# Patient Record
Sex: Male | Born: 1945
Health system: Southern US, Community
[De-identification: ages and names within clinical notes are randomized; demographics above are authoritative.]

## PROBLEM LIST (undated history)

## (undated) DIAGNOSIS — Z860100 Personal history of colon polyps, unspecified: Secondary | ICD-10-CM

## (undated) DIAGNOSIS — G4730 Sleep apnea, unspecified: Secondary | ICD-10-CM

## (undated) DIAGNOSIS — I609 Nontraumatic subarachnoid hemorrhage, unspecified: Secondary | ICD-10-CM

## (undated) DIAGNOSIS — N200 Calculus of kidney: Secondary | ICD-10-CM

## (undated) DIAGNOSIS — I2510 Atherosclerotic heart disease of native coronary artery without angina pectoris: Secondary | ICD-10-CM

## (undated) DIAGNOSIS — S065XAA Traumatic subdural hemorrhage with loss of consciousness status unknown, initial encounter: Secondary | ICD-10-CM

## (undated) DIAGNOSIS — Z8719 Personal history of other diseases of the digestive system: Secondary | ICD-10-CM

## (undated) DIAGNOSIS — R569 Unspecified convulsions: Secondary | ICD-10-CM

## (undated) DIAGNOSIS — E039 Hypothyroidism, unspecified: Secondary | ICD-10-CM

## (undated) DIAGNOSIS — R55 Syncope and collapse: Secondary | ICD-10-CM

## (undated) DIAGNOSIS — M1990 Unspecified osteoarthritis, unspecified site: Secondary | ICD-10-CM

## (undated) DIAGNOSIS — I739 Peripheral vascular disease, unspecified: Secondary | ICD-10-CM

## (undated) DIAGNOSIS — N529 Male erectile dysfunction, unspecified: Secondary | ICD-10-CM

## (undated) DIAGNOSIS — S065X9A Traumatic subdural hemorrhage with loss of consciousness of unspecified duration, initial encounter: Secondary | ICD-10-CM

## (undated) DIAGNOSIS — I10 Essential (primary) hypertension: Secondary | ICD-10-CM

## (undated) DIAGNOSIS — E119 Type 2 diabetes mellitus without complications: Secondary | ICD-10-CM

## (undated) DIAGNOSIS — T7840XA Allergy, unspecified, initial encounter: Secondary | ICD-10-CM

## (undated) HISTORY — DX: Allergy, unspecified, initial encounter: T78.40XA

## (undated) HISTORY — DX: Unspecified osteoarthritis, unspecified site: M19.90

## (undated) HISTORY — DX: Peripheral vascular disease, unspecified: I73.9

## (undated) HISTORY — DX: Sleep apnea, unspecified: G47.30

## (undated) HISTORY — DX: Atherosclerotic heart disease of native coronary artery without angina pectoris: I25.10

## (undated) HISTORY — DX: Traumatic subdural hemorrhage with loss of consciousness of unspecified duration, initial encounter: S06.5X9A

## (undated) HISTORY — DX: Essential (primary) hypertension: I10

## (undated) HISTORY — PX: POLYPECTOMY: SHX149

## (undated) HISTORY — DX: Nontraumatic subarachnoid hemorrhage, unspecified: I60.9

## (undated) HISTORY — DX: Syncope and collapse: R55

## (undated) HISTORY — DX: Traumatic subdural hemorrhage with loss of consciousness status unknown, initial encounter: S06.5XAA

---

## 1999-01-02 ENCOUNTER — Encounter: Payer: Self-pay | Admitting: Chiropractic Medicine

## 1999-01-02 ENCOUNTER — Ambulatory Visit (HOSPITAL_COMMUNITY): Admission: RE | Admit: 1999-01-02 | Discharge: 1999-01-02 | Payer: Self-pay | Admitting: Chiropractic Medicine

## 2001-04-30 ENCOUNTER — Encounter: Payer: Self-pay | Admitting: Emergency Medicine

## 2001-04-30 ENCOUNTER — Emergency Department (HOSPITAL_COMMUNITY): Admission: EM | Admit: 2001-04-30 | Discharge: 2001-04-30 | Payer: Self-pay | Admitting: Emergency Medicine

## 2003-12-13 ENCOUNTER — Encounter: Admission: RE | Admit: 2003-12-13 | Discharge: 2004-03-12 | Payer: Self-pay | Admitting: Internal Medicine

## 2004-05-21 ENCOUNTER — Ambulatory Visit (HOSPITAL_BASED_OUTPATIENT_CLINIC_OR_DEPARTMENT_OTHER): Admission: RE | Admit: 2004-05-21 | Discharge: 2004-05-21 | Payer: Self-pay | Admitting: Internal Medicine

## 2004-08-20 ENCOUNTER — Ambulatory Visit: Payer: Self-pay | Admitting: Endocrinology

## 2005-06-07 ENCOUNTER — Ambulatory Visit: Payer: Self-pay | Admitting: Endocrinology

## 2005-06-21 ENCOUNTER — Encounter: Admission: RE | Admit: 2005-06-21 | Discharge: 2005-09-19 | Payer: Self-pay | Admitting: Endocrinology

## 2005-11-04 ENCOUNTER — Ambulatory Visit: Payer: Self-pay | Admitting: Endocrinology

## 2005-11-22 ENCOUNTER — Ambulatory Visit: Payer: Self-pay | Admitting: Internal Medicine

## 2005-11-29 ENCOUNTER — Ambulatory Visit: Payer: Self-pay | Admitting: Internal Medicine

## 2006-06-13 ENCOUNTER — Ambulatory Visit: Payer: Self-pay | Admitting: Internal Medicine

## 2007-04-29 ENCOUNTER — Ambulatory Visit: Payer: Self-pay | Admitting: Internal Medicine

## 2007-04-29 LAB — CONVERTED CEMR LAB
ALT: 33 units/L (ref 0–53)
AST: 26 units/L (ref 0–37)
Albumin: 3.7 g/dL (ref 3.5–5.2)
Alkaline Phosphatase: 104 units/L (ref 39–117)
BUN: 18 mg/dL (ref 6–23)
Basophils Absolute: 0 10*3/uL (ref 0.0–0.1)
Basophils Relative: 0.4 % (ref 0.0–1.0)
Bilirubin Urine: NEGATIVE
Bilirubin, Direct: 0.1 mg/dL (ref 0.0–0.3)
CO2: 29 meq/L (ref 19–32)
Calcium: 9.3 mg/dL (ref 8.4–10.5)
Chloride: 107 meq/L (ref 96–112)
Cholesterol: 127 mg/dL (ref 0–200)
Creatinine, Ser: 0.9 mg/dL (ref 0.4–1.5)
Eosinophils Absolute: 0.2 10*3/uL (ref 0.0–0.6)
Eosinophils Relative: 3.5 % (ref 0.0–5.0)
GFR calc Af Amer: 110 mL/min
GFR calc non Af Amer: 91 mL/min
Glucose, Bld: 170 mg/dL — ABNORMAL HIGH (ref 70–99)
HCT: 45.7 % (ref 39.0–52.0)
HDL: 24.8 mg/dL — ABNORMAL LOW (ref >39.0)
Hemoglobin, Urine: NEGATIVE
Hemoglobin: 15.7 g/dL (ref 13.0–17.0)
Hgb A1c MFr Bld: 7.8 % — ABNORMAL HIGH (ref 4.6–6.0)
Ketones, ur: NEGATIVE mg/dL
LDL Cholesterol: 66 mg/dL (ref 0–99)
Leukocytes, UA: NEGATIVE
Lymphocytes Relative: 28.6 % (ref 12.0–46.0)
MCHC: 34.4 g/dL (ref 30.0–36.0)
MCV: 86.1 fL (ref 78.0–100.0)
Monocytes Absolute: 0.8 10*3/uL — ABNORMAL HIGH (ref 0.2–0.7)
Monocytes Relative: 11.2 % — ABNORMAL HIGH (ref 3.0–11.0)
Neutro Abs: 4 10*3/uL (ref 1.4–7.7)
Neutrophils Relative %: 56.3 % (ref 43.0–77.0)
Nitrite: NEGATIVE
PSA: 2.38 ng/mL (ref 0.10–4.00)
Platelets: 199 10*3/uL (ref 150–400)
Potassium: 4.5 meq/L (ref 3.5–5.1)
RBC: 5.31 M/uL (ref 4.22–5.81)
RDW: 13.5 % (ref 11.5–14.6)
Sodium: 141 meq/L (ref 135–145)
Specific Gravity, Urine: >=1.03 (ref 1.000–1.03)
TSH: 8.73 microintl units/mL — ABNORMAL HIGH (ref 0.35–5.50)
Total Bilirubin: 0.7 mg/dL (ref 0.3–1.2)
Total CHOL/HDL Ratio: 5.1
Total Protein, Urine: NEGATIVE mg/dL
Total Protein: 7.4 g/dL (ref 6.0–8.3)
Triglycerides: 179 mg/dL — ABNORMAL HIGH (ref 0–149)
Urine Glucose: NEGATIVE mg/dL
Urobilinogen, UA: 1 (ref 0.0–1.0)
VLDL: 36 mg/dL (ref 0–40)
WBC: 7 10*3/uL (ref 4.5–10.5)
pH: 6 (ref 5.0–8.0)

## 2007-05-07 ENCOUNTER — Ambulatory Visit: Payer: Self-pay | Admitting: Internal Medicine

## 2007-05-07 DIAGNOSIS — Z8719 Personal history of other diseases of the digestive system: Secondary | ICD-10-CM | POA: Insufficient documentation

## 2007-05-07 DIAGNOSIS — G4733 Obstructive sleep apnea (adult) (pediatric): Secondary | ICD-10-CM | POA: Insufficient documentation

## 2007-05-07 DIAGNOSIS — E039 Hypothyroidism, unspecified: Secondary | ICD-10-CM | POA: Insufficient documentation

## 2007-05-07 DIAGNOSIS — J309 Allergic rhinitis, unspecified: Secondary | ICD-10-CM | POA: Insufficient documentation

## 2007-05-24 DIAGNOSIS — E119 Type 2 diabetes mellitus without complications: Secondary | ICD-10-CM

## 2007-06-22 ENCOUNTER — Ambulatory Visit: Payer: Self-pay | Admitting: Internal Medicine

## 2007-06-30 ENCOUNTER — Encounter: Payer: Self-pay | Admitting: Internal Medicine

## 2007-07-07 ENCOUNTER — Ambulatory Visit: Payer: Self-pay | Admitting: Internal Medicine

## 2007-07-07 ENCOUNTER — Encounter: Payer: Self-pay | Admitting: Internal Medicine

## 2007-07-07 LAB — HM COLONOSCOPY

## 2007-11-03 ENCOUNTER — Ambulatory Visit: Payer: Self-pay | Admitting: Internal Medicine

## 2007-11-03 DIAGNOSIS — J019 Acute sinusitis, unspecified: Secondary | ICD-10-CM | POA: Insufficient documentation

## 2007-11-03 DIAGNOSIS — R059 Cough, unspecified: Secondary | ICD-10-CM | POA: Insufficient documentation

## 2007-11-03 DIAGNOSIS — R05 Cough: Secondary | ICD-10-CM

## 2007-11-05 ENCOUNTER — Ambulatory Visit: Payer: Self-pay | Admitting: Internal Medicine

## 2007-12-07 ENCOUNTER — Ambulatory Visit: Payer: Self-pay | Admitting: Internal Medicine

## 2007-12-07 DIAGNOSIS — Z87442 Personal history of urinary calculi: Secondary | ICD-10-CM

## 2007-12-07 DIAGNOSIS — Z860100 Personal history of colon polyps, unspecified: Secondary | ICD-10-CM | POA: Insufficient documentation

## 2007-12-07 DIAGNOSIS — K5730 Diverticulosis of large intestine without perforation or abscess without bleeding: Secondary | ICD-10-CM | POA: Insufficient documentation

## 2007-12-08 LAB — CONVERTED CEMR LAB
ALT: 45 units/L (ref 0–53)
AST: 28 units/L (ref 0–37)
Albumin: 4 g/dL (ref 3.5–5.2)
Alkaline Phosphatase: 103 units/L (ref 39–117)
BUN: 13 mg/dL (ref 6–23)
Bilirubin, Direct: 0.1 mg/dL (ref 0.0–0.3)
CO2: 30 meq/L (ref 19–32)
Calcium: 9.4 mg/dL (ref 8.4–10.5)
Chloride: 103 meq/L (ref 96–112)
Cholesterol: 132 mg/dL (ref 0–200)
Creatinine, Ser: 1 mg/dL (ref 0.4–1.5)
Direct LDL: 72.2 mg/dL
GFR calc Af Amer: 98 mL/min
GFR calc non Af Amer: 81 mL/min
Glucose, Bld: 205 mg/dL — ABNORMAL HIGH (ref 70–99)
HDL: 29 mg/dL — ABNORMAL LOW (ref >39.0)
Hgb A1c MFr Bld: 8.2 % — ABNORMAL HIGH (ref 4.6–6.0)
Potassium: 4.8 meq/L (ref 3.5–5.1)
Sodium: 140 meq/L (ref 135–145)
TSH: 2.42 microintl units/mL (ref 0.35–5.50)
Total Bilirubin: 0.7 mg/dL (ref 0.3–1.2)
Total CHOL/HDL Ratio: 4.6
Total Protein: 7.4 g/dL (ref 6.0–8.3)
Triglycerides: 224 mg/dL (ref 0–149)
VLDL: 45 mg/dL — ABNORMAL HIGH (ref 0–40)

## 2007-12-15 ENCOUNTER — Telehealth: Payer: Self-pay | Admitting: Internal Medicine

## 2008-01-26 ENCOUNTER — Ambulatory Visit: Payer: Self-pay | Admitting: Internal Medicine

## 2008-01-26 LAB — CONVERTED CEMR LAB
BUN: 14 mg/dL (ref 6–23)
Basophils Absolute: 0 10*3/uL (ref 0.0–0.1)
Basophils Relative: 0.5 % (ref 0.0–1.0)
CO2: 28 meq/L (ref 19–32)
Calcium: 9.2 mg/dL (ref 8.4–10.5)
Chloride: 97 meq/L (ref 96–112)
Creatinine, Ser: 1.1 mg/dL (ref 0.4–1.5)
Eosinophils Absolute: 0 10*3/uL (ref 0.0–0.7)
Eosinophils Relative: 0.1 % (ref 0.0–5.0)
GFR calc Af Amer: 88 mL/min
GFR calc non Af Amer: 72 mL/min
Glucose, Bld: 229 mg/dL — ABNORMAL HIGH (ref 70–99)
HCT: 48.3 % (ref 39.0–52.0)
Hemoglobin: 16 g/dL (ref 13.0–17.0)
Lymphocytes Relative: 14.6 % (ref 12.0–46.0)
MCHC: 33.1 g/dL (ref 30.0–36.0)
MCV: 86.3 fL (ref 78.0–100.0)
Monocytes Absolute: 0.7 10*3/uL (ref 0.1–1.0)
Monocytes Relative: 8.7 % (ref 3.0–12.0)
Neutro Abs: 5.7 10*3/uL (ref 1.4–7.7)
Neutrophils Relative %: 76.1 % (ref 43.0–77.0)
Platelets: 135 10*3/uL — ABNORMAL LOW (ref 150–400)
Potassium: 4.3 meq/L (ref 3.5–5.1)
RBC: 5.6 M/uL (ref 4.22–5.81)
RDW: 13.6 % (ref 11.5–14.6)
Sodium: 136 meq/L (ref 135–145)
WBC: 7.5 10*3/uL (ref 4.5–10.5)

## 2008-01-27 ENCOUNTER — Encounter: Payer: Self-pay | Admitting: Internal Medicine

## 2008-02-03 ENCOUNTER — Telehealth (INDEPENDENT_AMBULATORY_CARE_PROVIDER_SITE_OTHER): Payer: Self-pay | Admitting: *Deleted

## 2008-05-12 ENCOUNTER — Ambulatory Visit: Payer: Self-pay | Admitting: Internal Medicine

## 2008-05-12 LAB — CONVERTED CEMR LAB
ALT: 74 units/L — ABNORMAL HIGH (ref 0–53)
AST: 60 units/L — ABNORMAL HIGH (ref 0–37)
Albumin: 4 g/dL (ref 3.5–5.2)
Alkaline Phosphatase: 87 units/L (ref 39–117)
BUN: 13 mg/dL (ref 6–23)
Bacteria, UA: NEGATIVE
Basophils Absolute: 0 10*3/uL (ref 0.0–0.1)
Basophils Relative: 0.6 % (ref 0.0–3.0)
Bilirubin Urine: NEGATIVE
Bilirubin, Direct: 0.1 mg/dL (ref 0.0–0.3)
CO2: 28 meq/L (ref 19–32)
Calcium: 8.8 mg/dL (ref 8.4–10.5)
Chloride: 104 meq/L (ref 96–112)
Cholesterol: 122 mg/dL (ref 0–200)
Creatinine, Ser: 0.9 mg/dL (ref 0.4–1.5)
Crystals: NEGATIVE
Eosinophils Absolute: 0.3 10*3/uL (ref 0.0–0.7)
Eosinophils Relative: 4.5 % (ref 0.0–5.0)
GFR calc Af Amer: 110 mL/min
GFR calc non Af Amer: 91 mL/min
Glucose, Bld: 194 mg/dL — ABNORMAL HIGH (ref 70–99)
HCT: 45.8 % (ref 39.0–52.0)
HDL: 27.1 mg/dL — ABNORMAL LOW (ref >39.0)
Hemoglobin, Urine: NEGATIVE
Hemoglobin: 15.8 g/dL (ref 13.0–17.0)
Hgb A1c MFr Bld: 8.1 % — ABNORMAL HIGH (ref 4.6–6.0)
Ketones, ur: NEGATIVE mg/dL
LDL Cholesterol: 58 mg/dL (ref 0–99)
Leukocytes, UA: NEGATIVE
Lymphocytes Relative: 28.8 % (ref 12.0–46.0)
MCHC: 34.4 g/dL (ref 30.0–36.0)
MCV: 86 fL (ref 78.0–100.0)
Monocytes Absolute: 0.6 10*3/uL (ref 0.1–1.0)
Monocytes Relative: 8.7 % (ref 3.0–12.0)
Neutro Abs: 4.1 10*3/uL (ref 1.4–7.7)
Neutrophils Relative %: 57.4 % (ref 43.0–77.0)
Nitrite: NEGATIVE
PSA: 1.29 ng/mL (ref 0.10–4.00)
Platelets: 164 10*3/uL (ref 150–400)
Potassium: 4.4 meq/L (ref 3.5–5.1)
RBC: 5.32 M/uL (ref 4.22–5.81)
RDW: 14.4 % (ref 11.5–14.6)
Sodium: 139 meq/L (ref 135–145)
Specific Gravity, Urine: >=1.03 (ref 1.000–1.03)
Squamous Epithelial / HPF: NEGATIVE /lpf
TSH: 2.21 microintl units/mL (ref 0.35–5.50)
Total Bilirubin: 0.9 mg/dL (ref 0.3–1.2)
Total CHOL/HDL Ratio: 4.5
Total Protein: 7.3 g/dL (ref 6.0–8.3)
Triglycerides: 185 mg/dL — ABNORMAL HIGH (ref 0–149)
Urine Glucose: NEGATIVE mg/dL
Urobilinogen, UA: 0.2 (ref 0.0–1.0)
VLDL: 37 mg/dL (ref 0–40)
WBC: 7 10*3/uL (ref 4.5–10.5)
pH: 5.5 (ref 5.0–8.0)

## 2008-05-16 ENCOUNTER — Ambulatory Visit: Payer: Self-pay | Admitting: Internal Medicine

## 2008-05-16 DIAGNOSIS — I119 Hypertensive heart disease without heart failure: Secondary | ICD-10-CM

## 2008-05-16 DIAGNOSIS — R0989 Other specified symptoms and signs involving the circulatory and respiratory systems: Secondary | ICD-10-CM | POA: Insufficient documentation

## 2008-05-16 DIAGNOSIS — R0609 Other forms of dyspnea: Secondary | ICD-10-CM | POA: Insufficient documentation

## 2008-05-17 ENCOUNTER — Encounter: Payer: Self-pay | Admitting: Internal Medicine

## 2008-06-27 ENCOUNTER — Encounter: Payer: Self-pay | Admitting: Internal Medicine

## 2008-06-28 ENCOUNTER — Telehealth: Payer: Self-pay | Admitting: Internal Medicine

## 2008-09-23 ENCOUNTER — Telehealth: Payer: Self-pay | Admitting: Internal Medicine

## 2008-09-27 ENCOUNTER — Telehealth: Payer: Self-pay | Admitting: Internal Medicine

## 2008-11-04 ENCOUNTER — Ambulatory Visit: Payer: Self-pay | Admitting: Internal Medicine

## 2008-11-04 LAB — CONVERTED CEMR LAB
BUN: 18 mg/dL (ref 6–23)
CO2: 29 meq/L (ref 19–32)
Calcium: 9.2 mg/dL (ref 8.4–10.5)
Chloride: 102 meq/L (ref 96–112)
Cholesterol: 143 mg/dL (ref 0–200)
Creatinine, Ser: 1 mg/dL (ref 0.4–1.5)
GFR calc Af Amer: 97 mL/min
GFR calc non Af Amer: 80 mL/min
Glucose, Bld: 227 mg/dL — ABNORMAL HIGH (ref 70–99)
HDL: 29.7 mg/dL — ABNORMAL LOW (ref >39.0)
Hgb A1c MFr Bld: 7.5 % — ABNORMAL HIGH (ref 4.6–6.0)
LDL Cholesterol: 81 mg/dL (ref 0–99)
Potassium: 4.6 meq/L (ref 3.5–5.1)
Sodium: 137 meq/L (ref 135–145)
Total CHOL/HDL Ratio: 4.8
Triglycerides: 163 mg/dL — ABNORMAL HIGH (ref 0–149)
VLDL: 33 mg/dL (ref 0–40)

## 2008-11-21 ENCOUNTER — Ambulatory Visit: Payer: Self-pay | Admitting: Internal Medicine

## 2008-12-12 ENCOUNTER — Telehealth: Payer: Self-pay | Admitting: Internal Medicine

## 2008-12-20 ENCOUNTER — Telehealth: Payer: Self-pay | Admitting: Internal Medicine

## 2009-03-09 ENCOUNTER — Ambulatory Visit: Payer: Self-pay | Admitting: Internal Medicine

## 2009-03-20 ENCOUNTER — Ambulatory Visit: Payer: Self-pay | Admitting: Internal Medicine

## 2009-03-20 DIAGNOSIS — J9801 Acute bronchospasm: Secondary | ICD-10-CM | POA: Insufficient documentation

## 2009-03-28 ENCOUNTER — Telehealth: Payer: Self-pay | Admitting: Internal Medicine

## 2009-03-29 ENCOUNTER — Ambulatory Visit: Payer: Self-pay | Admitting: Internal Medicine

## 2009-05-19 ENCOUNTER — Telehealth: Payer: Self-pay | Admitting: Internal Medicine

## 2009-06-08 ENCOUNTER — Ambulatory Visit: Payer: Self-pay | Admitting: Internal Medicine

## 2009-06-09 LAB — CONVERTED CEMR LAB
ALT: 25 units/L (ref 0–53)
AST: 21 units/L (ref 0–37)
Alkaline Phosphatase: 81 units/L (ref 39–117)
Basophils Absolute: 0 10*3/uL (ref 0.0–0.1)
Bilirubin, Direct: 0.1 mg/dL (ref 0.0–0.3)
CO2: 29 meq/L (ref 19–32)
Calcium: 9 mg/dL (ref 8.4–10.5)
Chloride: 107 meq/L (ref 96–112)
Creatinine,U: 171.4 mg/dL
Eosinophils Relative: 4.1 % (ref 0.0–5.0)
Glucose, Bld: 139 mg/dL — ABNORMAL HIGH (ref 70–99)
HCT: 45.2 % (ref 39.0–52.0)
HDL: 31.7 mg/dL — ABNORMAL LOW (ref >39.00)
Ketones, ur: NEGATIVE mg/dL
LDL Cholesterol: 55 mg/dL (ref 0–99)
Lymphocytes Relative: 27.3 % (ref 12.0–46.0)
Microalb Creat Ratio: 5.3 mg/g (ref 0.0–30.0)
Microalb, Ur: 0.9 mg/dL (ref 0.0–1.9)
Monocytes Relative: 10.3 % (ref 3.0–12.0)
Neutrophils Relative %: 58.2 % (ref 43.0–77.0)
Platelets: 177 10*3/uL (ref 150.0–400.0)
RDW: 14.2 % (ref 11.5–14.6)
Sodium: 141 meq/L (ref 135–145)
Specific Gravity, Urine: >=1.03 (ref 1.000–1.030)
Total Bilirubin: 0.7 mg/dL (ref 0.3–1.2)
Total CHOL/HDL Ratio: 3
Urine Glucose: NEGATIVE mg/dL
VLDL: 21.2 mg/dL (ref 0.0–40.0)
WBC: 6.7 10*3/uL (ref 4.5–10.5)
pH: 6 (ref 5.0–8.0)

## 2009-06-12 ENCOUNTER — Ambulatory Visit: Payer: Self-pay | Admitting: Internal Medicine

## 2009-06-12 DIAGNOSIS — M722 Plantar fascial fibromatosis: Secondary | ICD-10-CM | POA: Insufficient documentation

## 2009-06-12 DIAGNOSIS — M79609 Pain in unspecified limb: Secondary | ICD-10-CM | POA: Insufficient documentation

## 2009-07-14 ENCOUNTER — Telehealth: Payer: Self-pay | Admitting: Internal Medicine

## 2009-07-14 DIAGNOSIS — M25519 Pain in unspecified shoulder: Secondary | ICD-10-CM | POA: Insufficient documentation

## 2009-07-27 ENCOUNTER — Telehealth: Payer: Self-pay | Admitting: Internal Medicine

## 2009-07-28 ENCOUNTER — Telehealth: Payer: Self-pay | Admitting: Internal Medicine

## 2009-09-09 ENCOUNTER — Encounter: Payer: Self-pay | Admitting: Internal Medicine

## 2009-12-07 ENCOUNTER — Ambulatory Visit: Payer: Self-pay | Admitting: Internal Medicine

## 2009-12-07 LAB — CONVERTED CEMR LAB
BUN: 15 mg/dL (ref 6–23)
Calcium: 9.2 mg/dL (ref 8.4–10.5)
Cholesterol: 113 mg/dL (ref 0–200)
GFR calc non Af Amer: 90.41 mL/min (ref >60)
Glucose, Bld: 131 mg/dL — ABNORMAL HIGH (ref 70–99)
HDL: 37.6 mg/dL — ABNORMAL LOW (ref >39.00)
Hgb A1c MFr Bld: 7 % — ABNORMAL HIGH (ref 4.6–6.5)
Potassium: 4.7 meq/L (ref 3.5–5.1)
Sodium: 140 meq/L (ref 135–145)
VLDL: 29 mg/dL (ref 0.0–40.0)

## 2009-12-11 ENCOUNTER — Ambulatory Visit: Payer: Self-pay | Admitting: Internal Medicine

## 2009-12-14 ENCOUNTER — Telehealth: Payer: Self-pay | Admitting: Internal Medicine

## 2009-12-27 ENCOUNTER — Telehealth: Payer: Self-pay | Admitting: Internal Medicine

## 2010-06-07 ENCOUNTER — Ambulatory Visit: Payer: Self-pay | Admitting: Internal Medicine

## 2010-06-07 LAB — CONVERTED CEMR LAB
ALT: 26 units/L (ref 0–53)
AST: 24 units/L (ref 0–37)
Alkaline Phosphatase: 80 units/L (ref 39–117)
Basophils Absolute: 0 10*3/uL (ref 0.0–0.1)
Bilirubin, Direct: 0.1 mg/dL (ref 0.0–0.3)
CO2: 27 meq/L (ref 19–32)
Calcium: 9.1 mg/dL (ref 8.4–10.5)
Chloride: 105 meq/L (ref 96–112)
Creatinine,U: 274.8 mg/dL
Eosinophils Relative: 3.2 % (ref 0.0–5.0)
Glucose, Bld: 142 mg/dL — ABNORMAL HIGH (ref 70–99)
HDL: 31.5 mg/dL — ABNORMAL LOW (ref >39.00)
Hgb A1c MFr Bld: 7.1 % — ABNORMAL HIGH (ref 4.6–6.5)
LDL Cholesterol: 58 mg/dL (ref 0–99)
Lymphocytes Relative: 22.7 % (ref 12.0–46.0)
Lymphs Abs: 1.7 10*3/uL (ref 0.7–4.0)
Microalb, Ur: 2.6 mg/dL — ABNORMAL HIGH (ref 0.0–1.9)
Monocytes Relative: 9.1 % (ref 3.0–12.0)
Neutrophils Relative %: 64.6 % (ref 43.0–77.0)
Platelets: 161 10*3/uL (ref 150.0–400.0)
RDW: 15.1 % — ABNORMAL HIGH (ref 11.5–14.6)
Sodium: 141 meq/L (ref 135–145)
Specific Gravity, Urine: >=1.03 (ref 1.000–1.030)
TSH: 1.11 microintl units/mL (ref 0.35–5.50)
Total Bilirubin: 0.8 mg/dL (ref 0.3–1.2)
Total CHOL/HDL Ratio: 4
Triglycerides: 132 mg/dL (ref 0.0–149.0)
Urine Glucose: NEGATIVE mg/dL
Urobilinogen, UA: 0.2 (ref 0.0–1.0)
VLDL: 26.4 mg/dL (ref 0.0–40.0)
WBC: 7.3 10*3/uL (ref 4.5–10.5)
pH: 5.5 (ref 5.0–8.0)

## 2010-06-11 ENCOUNTER — Encounter: Payer: Self-pay | Admitting: Internal Medicine

## 2010-06-11 ENCOUNTER — Ambulatory Visit: Payer: Self-pay | Admitting: Internal Medicine

## 2010-10-10 ENCOUNTER — Ambulatory Visit
Admission: RE | Admit: 2010-10-10 | Discharge: 2010-10-10 | Payer: Self-pay | Source: Home / Self Care | Attending: Internal Medicine | Admitting: Internal Medicine

## 2010-10-10 ENCOUNTER — Telehealth: Payer: Self-pay | Admitting: Internal Medicine

## 2010-10-11 ENCOUNTER — Telehealth: Payer: Self-pay | Admitting: Internal Medicine

## 2010-11-15 NOTE — Progress Notes (Signed)
  Phone Note Refill Request  on December 14, 2009 9:47 AM  Refills Requested: Medication #1:  GLIMEPIRIDE 4 MG TABS 1po once daily   Dosage confirmed as above?Dosage Confirmed Initial call taken by: Scharlene Gloss,  December 14, 2009 9:47 AM    Prescriptions: GLIMEPIRIDE 4 MG TABS (GLIMEPIRIDE) 1po once daily  #90 x 3   Entered by:   Scharlene Gloss   Authorized by:   Corwin Levins MD   Signed by:   Scharlene Gloss on 12/14/2009   Method used:   Faxed to ...       [facility]       [address] [place], [place]  [address]       Ph: [phone]       Fax: [phone]   RxID:   [id number]

## 2010-11-15 NOTE — Progress Notes (Signed)
  Phone Note Refill Request  on December 27, 2009 11:52 AM  Refills Requested: Medication #1:  ACTOS 45 MG TABS 1po once daily   Dosage confirmed as above?Dosage Confirmed   Notes: [facility] Initial call taken by: Scharlene Gloss,  December 27, 2009 11:52 AM    Prescriptions: ACTOS 45 MG TABS (PIOGLITAZONE HCL) 1po once daily  #90 x 3   Entered by:   Scharlene Gloss   Authorized by:   Corwin Levins MD   Signed by:   Scharlene Gloss on 12/27/2009   Method used:   Faxed to ...       [facility] ([facility]       [address] [place], [place]  [address]       Ph: [phone]       Fax: [phone]   RxID:   [id number]

## 2010-11-15 NOTE — Progress Notes (Signed)
Summary: Med change?  Phone Note From Pharmacy   [id number] Summary of Call: Pharmacy called stating Abx rx's today is on back order. Pt is requesting MD authorization to change to ER, okay to change? Initial call taken by: Margaret Pyle, CMA,  October 10, 2010 4:09 PM  Follow-up for Phone Call        ok to change to ER Follow-up by: Corwin Levins MD,  October 10, 2010 4:32 PM  Additional Follow-up for Phone Call Additional follow up Details #1::        pharmacy advised Additional Follow-up by: Margaret Pyle, CMA,  October 10, 2010 4:35 PM

## 2010-11-15 NOTE — Progress Notes (Signed)
  Phone Note Refill Request Message from:  Fax from Pharmacy on October 10, 2010 4:16 PM  Refills Requested: Medication #1:  WELLBUTRIN XL 300 MG  TB24 take 1 by mouth once daily ---  [facility] REF # [id number]   Dosage confirmed as above?Dosage Confirmed   Notes: [facility] Initial call taken by: Robin Ewing CMA Duncan Dull),  October 10, 2010 4:17 PM    Prescriptions: WELLBUTRIN XL 300 MG  TB24 (BUPROPION HCL) take 1 by mouth once daily ---  [facility] REF # [id number]  #90 x 3   Entered by:   Scharlene Gloss CMA (AAMA)   Authorized by:   Corwin Levins MD   Signed by:   Scharlene Gloss CMA (AAMA) on 10/10/2010   Method used:   Faxed to ...       [facility] (mail[facility]order)       [address] [place], [place]  [address]       Ph: [phone]       Fax: [phone]   RxID:   [id number]

## 2010-11-15 NOTE — Assessment & Plan Note (Signed)
Summary: CPX /NWS   #   Vital Signs:  Patient profile:   65 year old male Height:      73 inches Weight:      255 pounds BMI:     33.76 O2 Sat:      97 % on Room air Temp:     98.4 degrees F oral Pulse rate:   72 / minute BP sitting:   100 / 62  (left arm) Cuff size:   large  Vitals Entered By: Zella Ball Ewing CMA Duncan Dull) (June 11, 2010 8:35 AM)  O2 Flow:  Room air  Preventive Care Screening     declines flu shot;    CC: Adult Physical/RE   Primary Care Provider:  Jonny Ruiz  CC:  Adult Physical/RE.  History of Present Illness: here to f/u - wt down from 263 6 mo ago to current 255.  Pt denies CP, worsening sob, doe, wheezing, orthopnea, pnd, worsening LE edema, palps, dizziness or syncope  Pt denies new neuro symptoms such as headache, facial or extremity weakness  Pt denies polydipsia, polyuria, or low sugar symptoms such as shakiness improved with eating.  Overall good compliance with meds, trying to follow low chol, DM diet, wt stable, little excercise however   Overall lost 19 lbs since july 9 per pt.  Goal is another 25 lbs.    Preventive Screening-Counseling & Management  Alcohol-Tobacco     Smoking Status: quit      Drug Use:  no.    Problems Prior to Update: 1)  Shoulder Pain  (ICD-719.41) 2)  Plantar Fasciitis, Bilateral  (ICD-728.71) 3)  Thumb Pain, Bilateral  (ICD-729.5) 4)  Acute Bronchospasm  (ICD-519.11) 5)  Dyspnea On Exertion  (ICD-786.09) 6)  Hypertension  (ICD-401.9) 7)  Preventive Health Care  (ICD-V70.0) 8)  Colonic Polyps, Hx of  (ICD-V12.72) 9)  Diverticulosis, Colon  (ICD-562.10) 10)  Nephrolithiasis, Hx of  (ICD-V13.01) 11)  Hepatotoxicity, Drug-induced, Risk of  (ICD-V58.69) 12)  Sinusitis, Acute  (ICD-461.9) 13)  Cough  (ICD-786.2) 14)  Diverticulitis, Hx of  (ICD-V12.79) 15)  Erectile Dysfunction  (ICD-302.72) 16)  Allergic Rhinitis  (ICD-477.9) 17)  Depression  (ICD-311) 18)  Sleep Apnea, Obstructive  (ICD-327.23) 19)  Hypothyroidism   (ICD-244.9) 20)  Hyperlipidemia  (ICD-272.4) 21)  Diabetes Mellitus, Type II  (ICD-250.00)  Medications Prior to Update: 1)  Glucophage 1000 Mg  Tabs (Metformin Hcl) .... Take 2 By Mouth Qd 2)  Levothyroxine Sodium 175 Mcg  Tabs (Levothyroxine Sodium) .Marland Kitchen.. 1 By Mouth Qd 3)  Wellbutrin Xl 300 Mg  Tb24 (Bupropion Hcl) .... Take 1 By Mouth Once Daily ---  [facility] Ref # [id number] 4)  Lovastatin 40 Mg Tabs (Lovastatin) .Marland Kitchen.. 1 Tab By Mouth Once Daily 5)  Acyclovir 200 Mg Caps (Acyclovir) .... Take 1 Tablet By Mouth Once A Day As Needed 6)  Allegra-D 12 Hour 60-120 Mg Tb12 (Fexofenadine-Pseudoephedrine) .... Take 1 Tablet By Mouth Two Times A Day As Needed 7)  Flonase 50 Mcg/act Susp (Fluticasone Propionate) .... 2 Sprays Each Day 8)  Ecotrin Low Strength 81 Mg  Tbec (Aspirin) .... `po Qd 9)  Viagra 100 Mg  Tabs (Sildenafil Citrate) 10)  Actos 45 Mg Tabs (Pioglitazone Hcl) .Marland Kitchen.. 1po Once Daily 11)  Lisinopril 5 Mg  Tabs (Lisinopril) .Marland Kitchen.. 1 By Mouth Once Daily 12)  Glimepiride 4 Mg Tabs (Glimepiride) .Marland Kitchen.. 1po Once Daily 13)  Tessalon Perles 100 Mg Caps (Benzonatate) .Marland Kitchen.. 1 - 2 By Mouth Three Times A Day  As Needed 14)  Lovastatin 20 Mg Tabs (Lovastatin) .Marland Kitchen.. 1po Once Daily  Current Medications (verified): 1)  Glucophage 1000 Mg  Tabs (Metformin Hcl) .... Take 2 By Mouth Qd 2)  Levothyroxine Sodium 175 Mcg  Tabs (Levothyroxine Sodium) .Marland Kitchen.. 1 By Mouth Qd 3)  Wellbutrin Xl 300 Mg  Tb24 (Bupropion Hcl) .... Take 1 By Mouth Once Daily ---  [facility] Ref # [id number] 4)  Acyclovir 200 Mg Caps (Acyclovir) .... Take 1 Tablet By Mouth Once A Day As Needed 5)  Ecotrin Low Strength 81 Mg  Tbec (Aspirin) .... `po Qd 6)  Actos 45 Mg Tabs (Pioglitazone Hcl) .Marland Kitchen.. 1po Once Daily 7)  Lisinopril 5 Mg  Tabs (Lisinopril) .Marland Kitchen.. 1 By Mouth Once Daily 8)  Glimepiride 4 Mg Tabs (Glimepiride) .Marland Kitchen.. 1po Once Daily 9)  Lovastatin 20 Mg Tabs (Lovastatin) .Marland Kitchen.. 1po Once Daily  Allergies (verified): No Known  Drug Allergies  Past History:  Past Medical History: Last updated: 05/16/2008 Diabetes mellitus, type II Hyperlipidemia Hypothyroidism Obstructive Sleep Apnea Depression Allergic rhinitis Erectile Dysfunction Diverticulitis, hx of elevated PSA E.D. Nephrolithiasis, hx of hx of prostatitis Diverticulosis, colon Colonic polyps, hx of- adenomatous Hypertension  Past Surgical History: Last updated: 05/07/2007 Denies surgical history  Family History: Last updated: 06/11/2010 sister with aplastic anemia cousin with lung cancer Neg- colon cancer, prostate cancer;  Father and PGF with CAD brother with prostate cancer  Social History: Last updated: 06/11/2010 [employer] 6 years. 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 Drug use-no Former Smoker Alcohol use-yes -social   Risk Factors: Exercise: no (01/26/2008)  Risk Factors: Smoking Status: quit (06/11/2010)  Family History: Reviewed history from 01/26/2008 and no changes required. sister with aplastic anemia cousin with lung cancer Neg- colon cancer, prostate cancer;  Father and PGF with CAD brother with prostate cancer  Social History: Reviewed history from 01/26/2008 and no changes required. [employer] 6 years. 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         all otherwise negative per pt -    Physical Exam  General:  alert and overweight-appearing.   Head:  normocephalic and atraumatic.   Eyes:  vision grossly intact, pupils equal, and pupils round.   Ears:  R ear normal and L ear normal.   Nose:  no external deformity and no nasal discharge.   Mouth:  no gingival abnormalities and pharynx pink and moist.   Neck:  supple and no masses.   Lungs:  normal respiratory effort and normal breath sounds.   Heart:  normal rate and regular rhythm.   Abdomen:  soft, non-tender, and normal bowel sounds.   Msk:  no joint tenderness and no joint swelling.   Extremities:  no edema, no erythema  Neurologic:  cranial nerves II-XII intact and strength normal in all extremities.   Skin:  color normal and no rashes.   Psych:  not anxious appearing and not depressed appearing.  Impression & Recommendations:  Problem # 1:  Preventive Health Care (ICD-V70.0) Overall doing well, age appropriate education and counseling updated and referral for appropriate preventive services done unless declined, immunizations up to date or declined, diet counseling done if overweight, urged to quit smoking if smokes , most recent labs reviewed and current ordered if appropriate, ecg reviewed or declined (interpretation per ECG scanned in the EMR if done); information regarding Medicare Prevention requirements given if appropriate; speciality referrals updated as appropriate   Complete Medication List: 1)  Glucophage 1000 Mg Tabs (Metformin hcl) .... Take 2 by mouth qd 2)  Levothyroxine Sodium 175 Mcg Tabs (Levothyroxine sodium) .Marland Kitchen.. 1 by mouth qd 3)  Wellbutrin Xl 300 Mg Tb24 (Bupropion hcl) .... Take 1 by mouth once daily ---  [facility] ref # [id number] 4)  Acyclovir 200 Mg Caps (Acyclovir) .... Take 1 tablet by mouth once a day as needed 5)  Ecotrin Low Strength 81 Mg Tbec (Aspirin) .... `po qd 6)  Actos 45 Mg Tabs  (Pioglitazone hcl) .Marland Kitchen.. 1po once daily 7)  Lisinopril 5 Mg Tabs (Lisinopril) .Marland Kitchen.. 1 by mouth once daily 8)  Glimepiride 4 Mg Tabs (Glimepiride) .Marland Kitchen.. 1po once daily 9)  Lovastatin 20 Mg Tabs (Lovastatin) .Marland Kitchen.. 1po once daily  Patient Instructions: 1)  Continue all previous medications as before this visit  2)  Please schedule a follow-up appointment in 6 months with: 3)  BMP prior to visit, ICD-9: 250.02 4)  Lipid Panel prior to visit, ICD-9: 5)  HbgA1C prior to visit, ICD-9:

## 2010-11-15 NOTE — Assessment & Plan Note (Signed)
Summary: COUGH/NWS   Vital Signs:  Patient profile:   65 year old male Height:      73 inches Weight:      249.38 pounds BMI:     33.02 O2 Sat:      92 % on Room air Temp:     99.7 degrees F oral Pulse rate:   85 / minute BP sitting:   104 / 60  (left arm) Cuff size:   large  Vitals Entered By: Zella Ball Ewing CMA Duncan Dull) (October 10, 2010 2:37 PM)  O2 Flow:  Room air CC: Cough/RE   Primary Care Provider:  Jonny Ruiz  CC:  Cough/RE.  History of Present Illness: here with 3 days acute onset midl to mod facial pain, pressure, fever and greenish d/c, with mild st and increased prod cough with greenish sputum as well, but Pt denies CP, worsening sob, doe, wheezing, orthopnea, pnd, worsening LE edema, palps, dizziness or syncope  Pt denies new neuro symptoms such as headache, facial or extremity weakness  Pt denies polydipsia, polyuria, or low sugar symptoms such as shakiness improved with eating.  Overall good compliance with meds, trying to follow low chol, DM diet, wt stable, little excercise however  CBG's in the lower 100's,    Problems Prior to Update: 1)  Bronchitis-acute  (ICD-466.0) 2)  Sinusitis- Acute-nos  (ICD-461.9) 3)  Shoulder Pain  (ICD-719.41) 4)  Plantar Fasciitis, Bilateral  (ICD-728.71) 5)  Thumb Pain, Bilateral  (ICD-729.5) 6)  Acute Bronchospasm  (ICD-519.11) 7)  Dyspnea On Exertion  (ICD-786.09) 8)  Hypertension  (ICD-401.9) 9)  Preventive Health Care  (ICD-V70.0) 10)  Colonic Polyps, Hx of  (ICD-V12.72) 11)  Diverticulosis, Colon  (ICD-562.10) 12)  Nephrolithiasis, Hx of  (ICD-V13.01) 13)  Hepatotoxicity, Drug-induced, Risk of  (ICD-V58.69) 14)  Sinusitis, Acute  (ICD-461.9) 15)  Cough  (ICD-786.2) 16)  Diverticulitis, Hx of  (ICD-V12.79) 17)  Erectile Dysfunction  (ICD-302.72) 18)  Allergic Rhinitis  (ICD-477.9) 19)  Depression  (ICD-311) 20)  Sleep Apnea, Obstructive  (ICD-327.23) 21)  Hypothyroidism  (ICD-244.9) 22)  Hyperlipidemia  (ICD-272.4) 23)   Diabetes Mellitus, Type II  (ICD-250.00)  Medications Prior to Update: 1)  Glucophage 1000 Mg  Tabs (Metformin Hcl) .... Take 2 By Mouth Qd 2)  Levothyroxine Sodium 175 Mcg  Tabs (Levothyroxine Sodium) .Marland Kitchen.. 1 By Mouth Qd 3)  Wellbutrin Xl 300 Mg  Tb24 (Bupropion Hcl) .... Take 1 By Mouth Once Daily ---  [facility] Ref # [id number] 4)  Acyclovir 200 Mg Caps (Acyclovir) .... Take 1 Tablet By Mouth Once A Day As Needed 5)  Ecotrin Low Strength 81 Mg  Tbec (Aspirin) .... `po Qd 6)  Actos 45 Mg Tabs (Pioglitazone Hcl) .Marland Kitchen.. 1po Once Daily 7)  Lisinopril 5 Mg  Tabs (Lisinopril) .Marland Kitchen.. 1 By Mouth Once Daily 8)  Glimepiride 4 Mg Tabs (Glimepiride) .Marland Kitchen.. 1po Once Daily 9)  Lovastatin 20 Mg Tabs (Lovastatin) .Marland Kitchen.. 1po Once Daily  Current Medications (verified): 1)  Glucophage 1000 Mg  Tabs (Metformin Hcl) .... Take 2 By Mouth Qd 2)  Levothyroxine Sodium 175 Mcg  Tabs (Levothyroxine Sodium) .Marland Kitchen.. 1 By Mouth Qd 3)  Wellbutrin Xl 300 Mg  Tb24 (Bupropion Hcl) .... Take 1 By Mouth Once Daily ---  [facility] Ref # [id number] 4)  Acyclovir 200 Mg Caps (Acyclovir) .... Take 1 Tablet By Mouth Once A Day As Needed 5)  Ecotrin Low Strength 81 Mg  Tbec (Aspirin) .... `po Qd 6)  Actos 45 Mg Tabs (  Pioglitazone Hcl) .Marland Kitchen.. 1po Once Daily 7)  Lisinopril 5 Mg  Tabs (Lisinopril) .Marland Kitchen.. 1 By Mouth Once Daily 8)  Glimepiride 4 Mg Tabs (Glimepiride) .Marland Kitchen.. 1po Once Daily 9)  Lovastatin 20 Mg Tabs (Lovastatin) .Marland Kitchen.. 1po Once Daily 10)  Clarithromycin 500 Mg Tabs (Clarithromycin) .Marland Kitchen.. 1po Two Times A Day 11)  Hydrocodone-Homatropine 5-1.5 Mg/4ml Syrp (Hydrocodone-Homatropine) .Marland Kitchen.. 1 Tsp By Mouth Q 6 Hrs As Needed Cough  Allergies (verified): No Known Drug Allergies  Past History:  Past Medical History: Last updated: 05/16/2008 Diabetes mellitus, type II Hyperlipidemia Hypothyroidism Obstructive Sleep Apnea Depression Allergic rhinitis Erectile Dysfunction Diverticulitis, hx of elevated  PSA E.D. Nephrolithiasis, hx of hx of prostatitis Diverticulosis, colon Colonic polyps, hx of- adenomatous Hypertension  Past Surgical History: Last updated: 05/07/2007 Denies surgical history  Social History: Last updated: 06/11/2010 [employer] 6 years. 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  , mild ill  Head:  normocephalic and atraumatic.   Eyes:  vision grossly intact, pupils equal, and pupils round.   Ears:  bilat tm's red, sinus tender bilat Nose:  nasal dischargemucosal pallor and mucosal edema.   Mouth:  pharyngeal erythema and fair dentition.   Neck:  supple and cervical lymphadenopathy.   Lungs:  normal respiratory effort and normal breath sounds.   Heart:  normal rate and regular rhythm.   Extremities:  no edema, no erythema    Impression & Recommendations:  Problem # 1:  SINUSITIS- ACUTE-NOS (ICD-461.9)  His updated medication list for this problem includes:    Clarithromycin 500 Mg Tabs (Clarithromycin) .Marland Kitchen... 1po two times a day    Hydrocodone-homatropine 5-1.5 Mg/24ml Syrp (Hydrocodone-homatropine) .Marland Kitchen... 1 tsp by mouth q 6 hrs as needed cough treat as above, f/u any worsening signs or symptoms   Problem # 2:  BRONCHITIS-ACUTE (ICD-466.0)  His updated medication list for this problem includes:    Clarithromycin 500 Mg Tabs (Clarithromycin) .Marland Kitchen... 1po two times a day    Hydrocodone-homatropine 5-1.5 Mg/97ml Syrp (Hydrocodone-homatropine) .Marland Kitchen... 1 tsp by mouth q 6 hrs as needed cough treat as above, f/u any worsening signs or symptoms   Problem # 3:  DIABETES MELLITUS, TYPE II (ICD-250.00)  His updated medication  list for this problem includes:    Glucophage 1000 Mg Tabs (Metformin hcl) .Marland Kitchen... Take 2 by mouth qd    Ecotrin Low Strength 81 Mg Tbec (Aspirin) .Marland Kitchen... `po qd    Actos 45 Mg Tabs (Pioglitazone hcl) .Marland Kitchen... 1po once daily    Lisinopril 5 Mg Tabs (Lisinopril) .Marland Kitchen... 1 by mouth once daily    Glimepiride 4 Mg Tabs (Glimepiride) .Marland Kitchen... 1po once daily  Labs Reviewed: Creat: 1.0 (06/07/2010)    Reviewed HgBA1c results: 7.1 (06/07/2010)  7.0 (12/07/2009) stable overall by hx and exam, ok to continue meds/tx as is   Problem # 4:  HYPERTENSION (ICD-401.9)  His updated medication list for this problem includes:    Lisinopril 5 Mg Tabs (Lisinopril) .Marland Kitchen... 1 by mouth once daily  BP today: 104/60 Prior BP: 100/62 (06/11/2010)  Labs Reviewed: K+: 4.9 (06/07/2010) Creat: : 1.0 (06/07/2010)   Chol: 116 (06/07/2010)   HDL: 31.50 (06/07/2010)  LDL: 58 (06/07/2010)   TG: 132.0 (06/07/2010) stable overall by hx and exam, ok to continue meds/tx as is   Complete Medication List: 1)  Glucophage 1000 Mg Tabs (Metformin hcl) .... Take 2 by mouth qd 2)  Levothyroxine Sodium 175 Mcg Tabs (Levothyroxine sodium) .Marland Kitchen.. 1 by mouth qd 3)  Wellbutrin Xl 300 Mg Tb24 (Bupropion hcl) .... Take 1 by mouth once daily ---  [facility] # [id number] 4)  Acyclovir 200 Mg Caps (Acyclovir) .... Take 1 tablet by mouth once a day as needed 5)  Ecotrin Low Strength 81 Mg Tbec (Aspirin) .... `po qd 6)  Actos 45 Mg Tabs (Pioglitazone hcl) .Marland Kitchen.. 1po once daily 7)  Lisinopril 5 Mg Tabs (Lisinopril) .Marland Kitchen.. 1 by mouth once daily 8)  Glimepiride 4 Mg Tabs (Glimepiride) .Marland Kitchen.. 1po once daily 9)  Lovastatin 20 Mg Tabs (Lovastatin) .Marland Kitchen.. 1po once daily 10)  Clarithromycin 500 Mg Tabs (Clarithromycin) .Marland Kitchen.. 1po two times a day 11)  Hydrocodone-homatropine 5-1.5 Mg/59ml Syrp (Hydrocodone-homatropine) .Marland Kitchen.. 1 tsp by mouth q 6 hrs as needed cough  Other Orders: Admin 1st Vaccine (91478) DT Vaccine (29562)  Patient Instructions: 1)  Please  take all new medications as prescribed 2)  Continue all previous medications as before this visit  3)  Please schedule a follow-up appointment in August 2011 for CPX with labs, or sooner if needed Prescriptions: HYDROCODONE-HOMATROPINE 5-1.5 MG/5ML SYRP (HYDROCODONE-HOMATROPINE) 1 tsp by mouth q 6 hrs as needed cough  #6oz x 1   Entered and Authorized by:   Corwin Levins MD   Signed by:   Corwin Levins MD on 10/10/2010   Method used:   Print then Give to Patient   RxID:   [id number] CLARITHROMYCIN 500 MG TABS (CLARITHROMYCIN) 1po two times a day  #20 x 0   Entered and Authorized by:   Corwin Levins MD   Signed by:   Corwin Levins MD on 10/10/2010   Method used:   Print then Give to Patient   RxID:   [id number]    Orders Added: 1)  Admin 1st Vaccine [90471] 2)  DT Vaccine [64403] 3)  Est. Patient Level IV [47425]

## 2010-11-15 NOTE — Progress Notes (Signed)
  Phone Note Refill Request Message from:  Fax from Pharmacy on October 11, 2010 9:06 AM  Refills Requested: Medication #1:  GLUCOPHAGE 1000 MG  TABS take 2 by mouth qd   Dosage confirmed as above?Dosage Confirmed   Last Refilled: 07/28/2009   [facility]: [facility]  Medication #2:  LEVOTHYROXINE SODIUM 175 MCG  TABS 1 by mouth qd   Dosage confirmed as above?Dosage Confirmed   Last Refilled: 07/28/2009   Notes: [facility]  Medication #3:  LISINOPRIL 5 MG  TABS 1 by mouth once daily   Dosage confirmed as above?Dosage Confirmed   Last Refilled: 07/28/2009   Notes: [facility]  Medication #4:  LOVASTATIN 20 MG TABS 1po once daily   Dosage confirmed as above?Dosage Confirmed   Last Refilled: 12/11/2009   [facility] Initial call taken by: Robin Ewing CMA (AAMA),  October 11, 2010 9:07 AM    Prescriptions: LOVASTATIN 20 MG TABS (LOVASTATIN) 1po once daily  #90 x 3   Entered by:   Scharlene Gloss CMA (AAMA)   Authorized by:   Corwin Levins MD   Signed by:   Scharlene Gloss CMA (AAMA) on 10/11/2010   Method used:   Faxed to ...       [facility] ([facility]       [address] [place], [place]  [address]       Ph: [phone]       Fax: [phone]   RxID:   [id number] LISINOPRIL 5 MG  TABS (LISINOPRIL) 1 by mouth once daily  #90 x 3   Entered by:   Scharlene Gloss CMA (AAMA)   Authorized by:   Corwin Levins MD   Signed by:   Scharlene Gloss CMA (AAMA) on 10/11/2010   Method used:   Faxed to ...       [facility] ([facility])       [address] [place], [place]  [address]       Ph: [phone]       Fax: [phone]   RxID:   [id number] LEVOTHYROXINE SODIUM 175 MCG  TABS (LEVOTHYROXINE SODIUM) 1 by mouth qd  #90 x 3   Entered by:   Scharlene Gloss CMA (AAMA)   Authorized by:   Corwin Levins MD   Signed by:   Scharlene Gloss CMA (AAMA) on 10/11/2010   Method used:   Faxed to ...       [facility] (mail[facility])       [address] [place], [place]  [address]  Ph: [phone]       Fax: [phone]   RxID:   [id number] GLUCOPHAGE 1000 MG  TABS (METFORMIN HCL) take 2 by mouth qd  #180 x 3   Entered by:   Scharlene Gloss CMA (AAMA)   Authorized by:   Corwin Levins MD   Signed by:   Scharlene Gloss CMA (AAMA) on 10/11/2010   Method used:   Faxed to ...       [facility] (mail[facility]       [address] [place], [place]  [address]       Ph: [phone]       Fax: [phone]   RxID:   [id number]

## 2010-11-15 NOTE — Assessment & Plan Note (Signed)
Summary: 6 MO ROV /NWS $50   Vital Signs:  Patient profile:   65 year old male Height:      73 inches Weight:      263 pounds BMI:     34.82 O2 Sat:      96 % on Room air Temp:     97.7 degrees F oral Pulse rate:   67 / minute BP sitting:   102 / 70  (left arm) Cuff size:   large  Vitals Entered ByZella Ball Ewing (December 11, 2009 8:14 AM)  O2 Flow:  Room air  CC: 6 Mo ROV/RE   Primary Care Provider:  Jonny Ruiz  CC:  6 Mo ROV/RE.  History of Present Illness: overall doing well, no complaints,  Pt denies CP, sob, doe, wheezing, orthopnea, pnd, worsening LE edema, palps, dizziness or syncope   Pt denies new neuro symptoms such as headache, facial or extremity weakness   Pt denies polydipsia, polyuria, or low sugar symptoms such as shakiness improved with eating.  Overall good compliance with meds, trying to follow low chol, DM diet, wt stable, little excercise however   Tolerating meds well.  Due for tetanus today.    Problems Prior to Update: 1)  Shoulder Pain  (ICD-719.41) 2)  Plantar Fasciitis, Bilateral  (ICD-728.71) 3)  Thumb Pain, Bilateral  (ICD-729.5) 4)  Acute Bronchospasm  (ICD-519.11) 5)  Dyspnea On Exertion  (ICD-786.09) 6)  Hypertension  (ICD-401.9) 7)  Preventive Health Care  (ICD-V70.0) 8)  Colonic Polyps, Hx of  (ICD-V12.72) 9)  Diverticulosis, Colon  (ICD-562.10) 10)  Nephrolithiasis, Hx of  (ICD-V13.01) 11)  Hepatotoxicity, Drug-induced, Risk of  (ICD-V58.69) 12)  Sinusitis, Acute  (ICD-461.9) 13)  Cough  (ICD-786.2) 14)  Diverticulitis, Hx of  (ICD-V12.79) 15)  Erectile Dysfunction  (ICD-302.72) 16)  Allergic Rhinitis  (ICD-477.9) 17)  Depression  (ICD-311) 18)  Sleep Apnea, Obstructive  (ICD-327.23) 19)  Hypothyroidism  (ICD-244.9) 20)  Hyperlipidemia  (ICD-272.4) 21)  Diabetes Mellitus, Type II  (ICD-250.00)  Medications Prior to Update: 1)  Glucophage 1000 Mg  Tabs (Metformin Hcl) .... Take 2 By Mouth Qd 2)  Levothyroxine Sodium 175 Mcg  Tabs  (Levothyroxine Sodium) .Marland Kitchen.. 1 By Mouth Qd 3)  Wellbutrin Xl 300 Mg  Tb24 (Bupropion Hcl) .... Take 1 By Mouth Once Daily ---  [facility] Ref # [id number] 4)  Lovastatin 40 Mg Tabs (Lovastatin) .Marland Kitchen.. 1 Tab By Mouth Once Daily 5)  Acyclovir 200 Mg Caps (Acyclovir) .... Take 1 Tablet By Mouth Once A Day As Needed 6)  Allegra-D 12 Hour 60-120 Mg Tb12 (Fexofenadine-Pseudoephedrine) .... Take 1 Tablet By Mouth Two Times A Day As Needed 7)  Flonase 50 Mcg/act Susp (Fluticasone Propionate) .... 2 Sprays Each Day 8)  Ecotrin Low Strength 81 Mg  Tbec (Aspirin) .... `po Qd 9)  Viagra 100 Mg  Tabs (Sildenafil Citrate) 10)  Actos 45 Mg Tabs (Pioglitazone Hcl) .Marland Kitchen.. 1po Once Daily 11)  Lisinopril 5 Mg  Tabs (Lisinopril) .Marland Kitchen.. 1 By Mouth Once Daily 12)  Glimepiride 4 Mg Tabs (Glimepiride) .Marland Kitchen.. 1po Once Daily 13)  Tessalon Perles 100 Mg Caps (Benzonatate) .Marland Kitchen.. 1 - 2 By Mouth Three Times A Day As Needed  Current Medications (verified): 1)  Glucophage 1000 Mg  Tabs (Metformin Hcl) .... Take 2 By Mouth Qd 2)  Levothyroxine Sodium 175 Mcg  Tabs (Levothyroxine Sodium) .Marland Kitchen.. 1 By Mouth Qd 3)  Wellbutrin Xl 300 Mg  Tb24 (Bupropion Hcl) .... Take 1 By Mouth Once Daily ---  [facility] Ref # [id number] 4)  Lovastatin 40 Mg Tabs (Lovastatin) .Marland Kitchen.. 1 Tab By Mouth Once Daily 5)  Acyclovir 200 Mg Caps (Acyclovir) .... Take 1 Tablet By Mouth Once A Day As Needed 6)  Allegra-D 12 Hour 60-120 Mg Tb12 (Fexofenadine-Pseudoephedrine) .... Take 1 Tablet By Mouth Two Times A Day As Needed 7)  Flonase 50 Mcg/act Susp (Fluticasone Propionate) .... 2 Sprays Each Day 8)  Ecotrin Low Strength 81 Mg  Tbec (Aspirin) .... `po Qd 9)  Viagra 100 Mg  Tabs (Sildenafil Citrate) 10)  Actos 45 Mg Tabs (Pioglitazone Hcl) .Marland Kitchen.. 1po Once Daily 11)  Lisinopril 5 Mg  Tabs (Lisinopril) .Marland Kitchen.. 1 By Mouth Once Daily 12)  Glimepiride 4 Mg Tabs (Glimepiride) .Marland Kitchen.. 1po Once Daily 13)  Tessalon Perles 100 Mg Caps (Benzonatate) .Marland Kitchen.. 1 - 2 By Mouth  Three Times A Day As Needed 14)  Lovastatin 20 Mg Tabs (Lovastatin) .Marland Kitchen.. 1po Once Daily  Allergies (verified): No Known Drug Allergies  Past History:  Past Medical History: Last updated: 05/16/2008 Diabetes mellitus, type II Hyperlipidemia Hypothyroidism Obstructive Sleep Apnea Depression Allergic rhinitis Erectile Dysfunction Diverticulitis, hx of elevated PSA E.D. Nephrolithiasis, hx of hx of prostatitis Diverticulosis, colon Colonic polyps, hx of- adenomatous Hypertension  Past Surgical History: Last updated: 05/07/2007 Denies surgical history  Social History: Last updated: 01/26/2008 [employer] 6 years. 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  Risk Factors: Exercise: no (01/26/2008)  Risk Factors: Smoking Status: quit (11/03/2007)  Review of Systems       all otherwise negative per pt -    Physical Exam  General:  alert and overweight-appearing.   Head:  normocephalic and atraumatic.   Eyes:  vision grossly intact, pupils equal, and pupils round.   Ears:  R ear normal and L ear normal.   Nose:  no external deformity and no nasal discharge.   Mouth:  no gingival abnormalities and pharynx pink and moist.   Neck:  supple and no masses.   Lungs:  normal respiratory effort and normal breath sounds.   Heart:  normal rate and regular rhythm.   Extremities:  no edema, no erythema    Impression & Recommendations:  Problem # 1:  HYPERTENSION (ICD-401.9)  His updated medication list for this problem includes:    Lisinopril 5 Mg Tabs (Lisinopril) .Marland Kitchen... 1 by mouth once daily  BP today: 102/70 Prior BP: 100/68 (06/12/2009)  Labs Reviewed: K+: 4.7 (12/07/2009) Creat: : 0.9 (12/07/2009)   Chol: 113 (12/07/2009)   HDL: 37.60 (12/07/2009)   LDL: 46 (12/07/2009)   TG: 145.0 (12/07/2009) stable overall by hx and exam, ok to continue meds/tx as is   Problem # 2:  HYPERLIPIDEMIA  (ICD-272.4)  His updated medication list for this problem includes:    Lovastatin 40 Mg Tabs (Lovastatin) .Marland Kitchen... 1 tab by mouth once daily    Lovastatin 20 Mg Tabs (Lovastatin) .Marland Kitchen... 1po once daily  Labs Reviewed: SGOT: 21 (06/08/2009)   SGPT: 25 (06/08/2009)   HDL:37.60 (12/07/2009), 31.70 (06/08/2009)  LDL:46 (12/07/2009), 55 (06/08/2009)  Chol:113 (12/07/2009), 108 (06/08/2009)  Trig:145.0 (12/07/2009), 106.0 (06/08/2009) overcontrolled, plans to follow lower chol diet further - to reduce the statin to 20 mg  Problem # 3:  DIABETES MELLITUS, TYPE II (ICD-250.00)  His updated medication list for this problem includes:    Glucophage 1000 Mg Tabs (Metformin hcl) .Marland Kitchen... Take 2 by mouth qd    Ecotrin Low  Strength 81 Mg Tbec (Aspirin) .Marland Kitchen... `po qd    Actos 45 Mg Tabs (Pioglitazone hcl) .Marland Kitchen... 1po once daily    Lisinopril 5 Mg Tabs (Lisinopril) .Marland Kitchen... 1 by mouth once daily    Glimepiride 4 Mg Tabs (Glimepiride) .Marland Kitchen... 1po once daily  Labs Reviewed: Creat: 0.9 (12/07/2009)    Reviewed HgBA1c results: 7.0 (12/07/2009)  6.8 (06/08/2009) stable overall by hx and exam, ok to continue meds/tx as is   Complete Medication List: 1)  Glucophage 1000 Mg Tabs (Metformin hcl) .... Take 2 by mouth qd 2)  Levothyroxine Sodium 175 Mcg Tabs (Levothyroxine sodium) .Marland Kitchen.. 1 by mouth qd 3)  Wellbutrin Xl 300 Mg Tb24 (Bupropion hcl) .... Take 1 by mouth once daily ---  [facility] ref # [id number] 4)  Lovastatin 40 Mg Tabs (Lovastatin) .Marland Kitchen.. 1 tab by mouth once daily 5)  Acyclovir 200 Mg Caps (Acyclovir) .... Take 1 tablet by mouth once a day as needed 6)  Allegra-d 12 Hour 60-120 Mg Tb12 (Fexofenadine-pseudoephedrine) .... Take 1 tablet by mouth two times a day as needed 7)  Flonase 50 Mcg/act Susp (Fluticasone propionate) .... 2 sprays each day 8)  Ecotrin Low Strength 81 Mg Tbec (Aspirin) .... `po qd 9)  Viagra 100 Mg Tabs (Sildenafil citrate) 10)  Actos 45 Mg Tabs (Pioglitazone hcl) .Marland Kitchen.. 1po once  daily 11)  Lisinopril 5 Mg Tabs (Lisinopril) .Marland Kitchen.. 1 by mouth once daily 12)  Glimepiride 4 Mg Tabs (Glimepiride) .Marland Kitchen.. 1po once daily 13)  Tessalon Perles 100 Mg Caps (Benzonatate) .Marland Kitchen.. 1 - 2 by mouth three times a day as needed 14)  Lovastatin 20 Mg Tabs (Lovastatin) .Marland Kitchen.. 1po once daily  Other Orders: Admin 1st Vaccine (91478) Flu Vaccine 66yrs + (29562)  Patient Instructions: 1)  decrease the kovastatin 20 mg per day 2)  Continue all previous medications as before this visit  3)  Please schedule a follow-up appointment in 6 months with CPX labs and: 4)  HbgA1C prior to visit, ICD-9: 250.02 5)  Urine Microalbumin prior to visit, ICD-9: Prescriptions: LOVASTATIN 20 MG TABS (LOVASTATIN) 1po once daily  #90 x 3   Entered and Authorized by:   Corwin Levins MD   Signed by:   Corwin Levins MD on 12/11/2009   Method used:   Electronically to        [id number]       [address]       [place], [place]  [address]       Ph: [phone] or [phone]       Fax: [phone]   RxID:   [id number]      Flu Vaccine Consent Questions     Do you have a history of severe allergic reactions to this vaccine? no    Any prior history of allergic reactions to egg and/or gelatin? no    Do you have a sensitivity to the preservative Thimersol? no    Do you have a past history of Guillan-Barre Syndrome? no    Do you currently have an acute febrile illness? no    Have you ever had a severe reaction to latex? no    Vaccine information given and explained to patient? yes    Are you currently pregnant? no    Lot Number:[id number]   Exp Date:04/12/2010   Site Given  Left Deltoid IMflu

## 2010-12-05 ENCOUNTER — Encounter (INDEPENDENT_AMBULATORY_CARE_PROVIDER_SITE_OTHER): Payer: Self-pay | Admitting: *Deleted

## 2010-12-05 ENCOUNTER — Other Ambulatory Visit: Payer: BC Managed Care – PPO

## 2010-12-05 ENCOUNTER — Other Ambulatory Visit: Payer: Self-pay | Admitting: Internal Medicine

## 2010-12-05 DIAGNOSIS — E119 Type 2 diabetes mellitus without complications: Secondary | ICD-10-CM

## 2010-12-05 LAB — BASIC METABOLIC PANEL
BUN: 14 mg/dL (ref 6–23)
Calcium: 9 mg/dL (ref 8.4–10.5)
GFR: 97.59 mL/min (ref >60.00)
Glucose, Bld: 131 mg/dL — ABNORMAL HIGH (ref 70–99)
Sodium: 139 mEq/L (ref 135–145)

## 2010-12-05 LAB — LIPID PANEL
Total CHOL/HDL Ratio: 3
VLDL: 20.4 mg/dL (ref 0.0–40.0)

## 2010-12-05 LAB — HEMOGLOBIN A1C: Hgb A1c MFr Bld: 7.3 % — ABNORMAL HIGH (ref 4.6–6.5)

## 2010-12-11 ENCOUNTER — Ambulatory Visit: Payer: Self-pay | Admitting: Internal Medicine

## 2010-12-12 ENCOUNTER — Ambulatory Visit (INDEPENDENT_AMBULATORY_CARE_PROVIDER_SITE_OTHER): Payer: BC Managed Care – PPO | Admitting: Internal Medicine

## 2010-12-12 ENCOUNTER — Encounter: Payer: Self-pay | Admitting: Internal Medicine

## 2010-12-12 DIAGNOSIS — F329 Major depressive disorder, single episode, unspecified: Secondary | ICD-10-CM

## 2010-12-12 DIAGNOSIS — E785 Hyperlipidemia, unspecified: Secondary | ICD-10-CM

## 2010-12-12 DIAGNOSIS — I10 Essential (primary) hypertension: Secondary | ICD-10-CM

## 2010-12-12 DIAGNOSIS — F3289 Other specified depressive episodes: Secondary | ICD-10-CM

## 2010-12-12 DIAGNOSIS — E119 Type 2 diabetes mellitus without complications: Secondary | ICD-10-CM

## 2010-12-20 NOTE — Assessment & Plan Note (Signed)
Summary: 6 MOS F/U # /CD   Vital Signs:  Patient profile:   65 year old male Height:      73 inches Weight:      259.38 pounds BMI:     34.34 O2 Sat:      92 % on Room air Temp:     97.8 degrees F oral Pulse rate:   70 / minute BP sitting:   110 / 62  (left arm) Cuff size:   large  Vitals Entered By: Zella Ball Ewing CMA (AAMA) (December 12, 2010 8:16 AM)  O2 Flow:  Room air CC: 6 month ROV/RE   Primary Care Provider:  Jonny Ruiz  CC:  6 month ROV/RE.  History of Present Illness: here to f/u - overall doing ok;  Pt denies CP, worsening sob, doe, wheezing, orthopnea, pnd, worsening LE edema, palps, dizziness or syncope  Pt denies new neuro symptoms such as headache, facial or extremity weakness  Pt denies polydipsia, polyuria   Overall good compliance with meds, trying to follow low chol diet, wt up 4 lbs, little excercise however . No fever, wt loss, night sweats, loss of appetite or other constitutional symptoms  Overall good compliance with meds, and good tolerability.  Denies worsening depressive symptoms, suicidal ideation, or panic. and good compliance with meds.  Does have base of thumbs recurring pain, wihtout swelling , but tylenol helps.  Has occasionaly thenar eminence crampiness on the right hand after using the mouse on the computer too much.    Problems Prior to Update: 1)  Sinusitis- Acute-nos  (ICD-461.9) 2)  Shoulder Pain  (ICD-719.41) 3)  Plantar Fasciitis, Bilateral  (ICD-728.71) 4)  Thumb Pain, Bilateral  (ICD-729.5) 5)  Acute Bronchospasm  (ICD-519.11) 6)  Dyspnea On Exertion  (ICD-786.09) 7)  Hypertension  (ICD-401.9) 8)  Preventive Health Care  (ICD-V70.0) 9)  Colonic Polyps, Hx of  (ICD-V12.72) 10)  Diverticulosis, Colon  (ICD-562.10) 11)  Nephrolithiasis, Hx of  (ICD-V13.01) 12)  Hepatotoxicity, Drug-induced, Risk of  (ICD-V58.69) 13)  Sinusitis, Acute  (ICD-461.9) 14)  Cough  (ICD-786.2) 15)  Diverticulitis, Hx of  (ICD-V12.79) 16)  Erectile Dysfunction   (ICD-302.72) 17)  Allergic Rhinitis  (ICD-477.9) 18)  Depression  (ICD-311) 19)  Sleep Apnea, Obstructive  (ICD-327.23) 20)  Hypothyroidism  (ICD-244.9) 21)  Hyperlipidemia  (ICD-272.4) 22)  Diabetes Mellitus, Type II  (ICD-250.00)  Medications Prior to Update: 1)  Glucophage 1000 Mg  Tabs (Metformin Hcl) .... Take 2 By Mouth Qd 2)  Levothyroxine Sodium 175 Mcg  Tabs (Levothyroxine Sodium) .Marland Kitchen.. 1 By Mouth Qd 3)  Wellbutrin Xl 300 Mg  Tb24 (Bupropion Hcl) .... Take 1 By Mouth Once Daily ---  [facility] Ref # [id number] 4)  Acyclovir 200 Mg Caps (Acyclovir) .... Take 1 Tablet By Mouth Once A Day As Needed 5)  Ecotrin Low Strength 81 Mg  Tbec (Aspirin) .... `po Qd 6)  Actos 45 Mg Tabs (Pioglitazone Hcl) .Marland Kitchen.. 1po Once Daily 7)  Lisinopril 5 Mg  Tabs (Lisinopril) .Marland Kitchen.. 1 By Mouth Once Daily 8)  Glimepiride 4 Mg Tabs (Glimepiride) .Marland Kitchen.. 1po Once Daily 9)  Lovastatin 20 Mg Tabs (Lovastatin) .Marland Kitchen.. 1po Once Daily 10)  Clarithromycin 500 Mg Tabs (Clarithromycin) .Marland Kitchen.. 1po Two Times A Day 11)  Hydrocodone-Homatropine 5-1.5 Mg/24ml Syrp (Hydrocodone-Homatropine) .Marland Kitchen.. 1 Tsp By Mouth Q 6 Hrs As Needed Cough  Current Medications (verified): 1)  Glucophage 1000 Mg  Tabs (Metformin Hcl) .... Take 2 By Mouth Qd 2)  Levothyroxine Sodium 175 Mcg  Tabs (Levothyroxine Sodium) .Marland Kitchen.. 1 By Mouth Qd 3)  Wellbutrin Xl 300 Mg  Tb24 (Bupropion Hcl) .... Take 1 By Mouth Once Daily ---  [facility] Ref # [id number] 4)  Acyclovir 200 Mg Caps (Acyclovir) .... Take 1 Tablet By Mouth Once A Day As Needed 5)  Ecotrin Low Strength 81 Mg  Tbec (Aspirin) .... `po Qd 6)  Actos 45 Mg Tabs (Pioglitazone Hcl) .Marland Kitchen.. 1po Once Daily 7)  Lisinopril 5 Mg  Tabs (Lisinopril) .Marland Kitchen.. 1 By Mouth Once Daily 8)  Glimepiride 4 Mg Tabs (Glimepiride) .Marland Kitchen.. 1po Once Daily 9)  Lovastatin 20 Mg Tabs (Lovastatin) .Marland Kitchen.. 1po Once Daily 10)  Clarithromycin 500 Mg Tabs (Clarithromycin) .Marland Kitchen.. 1po Two Times A Day 11)  Hydrocodone-Homatropine 5-1.5  Mg/26ml Syrp (Hydrocodone-Homatropine) .Marland Kitchen.. 1 Tsp By Mouth Q 6 Hrs As Needed Cough  Allergies (verified): No Known Drug Allergies  Past History:  Past Medical History: Last updated: 05/16/2008 Diabetes mellitus, type II Hyperlipidemia Hypothyroidism Obstructive Sleep Apnea Depression Allergic rhinitis Erectile Dysfunction Diverticulitis, hx of elevated PSA E.D. Nephrolithiasis, hx of hx of prostatitis Diverticulosis, colon Colonic polyps, hx of- adenomatous Hypertension  Past Surgical History: Last updated: 05/07/2007 Denies surgical history  Social History: Last updated: 06/11/2010 [employer] 6 years. 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     Impression & Recommendations:  Problem # 1:  HYPERTENSION (ICD-401.9)  His updated medication list for this problem includes:    Lisinopril 5 Mg Tabs (Lisinopril) .Marland Kitchen... 1 by mouth once daily  BP today: 110/62 Prior BP: 104/60 (10/10/2010)  Labs Reviewed: K+: 5.2 (12/05/2010) Creat: : 0.8 (12/05/2010)   Chol: 115 (12/05/2010)   HDL: 35.70 (12/05/2010)   LDL: 59 (12/05/2010)   TG: 102.0 (12/05/2010) stable overall by hx and exam, ok to continue meds/tx as is   Problem # 2:  HYPERLIPIDEMIA (ICD-272.4)  His updated medication list for this problem includes:    Lovastatin 20 Mg Tabs (Lovastatin) .Marland Kitchen... 1po once daily  Labs Reviewed: SGOT: 24 (06/07/2010)   SGPT: 26 (06/07/2010)   HDL:35.70 (12/05/2010), 31.50 (06/07/2010)  LDL:59 (12/05/2010), 58 (06/07/2010)  Chol:115 (12/05/2010), 116 (06/07/2010)  Trig:102.0 (12/05/2010), 132.0 (06/07/2010) stable overall by hx and exam,  ok to continue meds/tx as is , Pt to continue diet efforts, good med tolerance; to check labs again next visit- goal LDL less than 70  Problem # 3:  DIABETES MELLITUS, TYPE II (ICD-250.00)  His updated medication list for this problem includes:    Glucophage 1000 Mg Tabs (Metformin hcl) .Marland Kitchen... Take 2 by mouth qd    Ecotrin Low Strength 81 Mg Tbec (Aspirin) .Marland Kitchen... `po qd    Actos 45 Mg Tabs (Pioglitazone hcl) .Marland Kitchen... 1po once daily    Lisinopril 5 Mg Tabs (Lisinopril) .Marland Kitchen... 1 by mouth once daily    Glimepiride 4 Mg Tabs (Glimepiride) .Marland Kitchen... 1po once daily  Labs Reviewed: Creat: 0.8 (12/05/2010)    Reviewed HgBA1c results: 7.3 (12/05/2010)  7.1 (06/07/2010) mild uncontrolled but overall stable overall by hx and exam, ok to continue meds/tx as is , Pt to cont DM diet, excercise, wt control efforts; to check labs next visit - pt vows  to lose wt by next visit  Problem # 4:  DEPRESSION (ICD-311)  His updated medication list for this problem includes:    Wellbutrin Xl 300 Mg Tb24 (Bupropion hcl) .Marland Kitchen... Take 1 by mouth once daily ---  [facility] ref # [id number]  Discussed treatment options  Patient agrees to call if any worsening of symptoms or thoughts of doing harm arise. Verified that the patient has no suicidal ideation at this time.   stable overall by hx and exam, ok to continue meds/tx as is   Complete Medication List: 1)  Glucophage 1000 Mg Tabs (Metformin hcl) .... Take 2 by mouth qd 2)  Levothyroxine Sodium 175 Mcg Tabs (Levothyroxine sodium) .Marland Kitchen.. 1 by mouth qd 3)  Wellbutrin Xl 300 Mg Tb24 (Bupropion hcl) .... Take 1 by mouth once daily ---  [facility] ref # [id number] 4)  Acyclovir 200 Mg Caps (Acyclovir) .... Take 1 tablet by mouth once a day as needed 5)  Ecotrin Low Strength 81 Mg Tbec (Aspirin) .... `po qd 6)  Actos 45 Mg Tabs (Pioglitazone hcl) .Marland Kitchen.. 1po once daily 7)  Lisinopril 5 Mg Tabs (Lisinopril) .Marland Kitchen.. 1 by mouth once daily 8)  Glimepiride 4 Mg Tabs  (Glimepiride) .Marland Kitchen.. 1po once daily 9)  Lovastatin 20 Mg Tabs (Lovastatin) .Marland Kitchen.. 1po once daily 10)  Clarithromycin 500 Mg Tabs (Clarithromycin) .Marland Kitchen.. 1po two times a day 11)  Hydrocodone-homatropine 5-1.5 Mg/17ml Syrp (Hydrocodone-homatropine) .Marland Kitchen.. 1 tsp by mouth q 6 hrs as needed cough  Patient Instructions: 1)  Continue all previous medications as before this visit 2)  Please schedule a follow-up appointment in 6 months for CPX with labs and: 3)  HbgA1C prior to visit, ICD-9: 250.02 4)  Urine Microalbumin prior to visit, ICD-9:   Orders Added: 1)  Est. Patient Level IV [57846]

## 2011-01-14 ENCOUNTER — Other Ambulatory Visit: Payer: Self-pay

## 2011-01-14 MED ORDER — PIOGLITAZONE HCL 45 MG PO TABS: 45.0000 mg | ORAL_TABLET | Freq: Every day | ORAL | Status: DC

## 2011-01-14 NOTE — Telephone Encounter (Signed)
[facility] requesting refill on Actos 45 mg. Last refill 12/27/09 #90 with 3 refills, last OV 12/12/10.

## 2011-01-15 NOTE — Telephone Encounter (Signed)
Called pharmacy and gave a verbal refill.

## 2011-02-08 ENCOUNTER — Ambulatory Visit (INDEPENDENT_AMBULATORY_CARE_PROVIDER_SITE_OTHER): Payer: BC Managed Care – PPO | Admitting: Internal Medicine

## 2011-02-08 ENCOUNTER — Encounter: Payer: Self-pay | Admitting: Internal Medicine

## 2011-02-08 VITALS — BP 130/82 | HR 72 | Temp 98.6°F | Resp 16 | Ht 73.0 in | Wt 267.0 lb

## 2011-02-08 DIAGNOSIS — J209 Acute bronchitis, unspecified: Secondary | ICD-10-CM

## 2011-02-08 MED ORDER — HYDROCODONE-HOMATROPINE 5-1.5 MG/5ML PO SYRP: 5.0000 mL | ORAL_SOLUTION | Freq: Four times a day (QID) | ORAL | Status: AC | PRN

## 2011-02-08 MED ORDER — CLARITHROMYCIN 500 MG PO TABS: 500.0000 mg | ORAL_TABLET | Freq: Two times a day (BID) | ORAL | Status: AC

## 2011-02-08 NOTE — Progress Notes (Signed)
  Subjective:    Patient ID: Allen Myers, male    DOB: [birth year], 65 y.o.   MRN: [id number]  HPI   HPI  C/o URI sx's x few days. C/o ST, cough, weakness. Not better with OTC medicines. Actually, the patient is getting worse. The patient did not sleep last night due to cough.  Review of Systems  Constitutional: Positive for fever, chills and fatigue.  HENT: Positive for congestion, rhinorrhea, sneezing and postnasal drip.   . Negative for discharge and visual disturbance.  Respiratory: Positive for cough, shortness of breath and wheezing.     Gastrointestinal: Negative for vomiting, abdominal pain, diarrhea and abdominal distention.  Genitourinary: Negative for dysuria and difficulty urinating.  Skin: Negative for rash.  Neurological: Positive for dizziness, weakness and light-headedness.      Review of Systems     Objective:   Physical Exam  Constitutional: He is oriented to person, place, and time. He appears well-developed. No distress.  HENT:  Mouth/Throat: Oropharynx is clear and moist. Oropharyngeal exudate: eryth throat, hoarse.  Eyes: Conjunctivae are normal. Pupils are equal, round, and reactive to light.  Neck: Normal range of motion. No JVD present. No thyromegaly present.  Cardiovascular: Normal rate, regular rhythm, normal heart sounds and intact distal pulses.  Exam reveals no gallop and no friction rub.   No murmur heard. Pulmonary/Chest: Effort normal and breath sounds normal. No respiratory distress. He has no wheezes. He has no rales. He exhibits no tenderness.  Abdominal: Soft. Bowel sounds are normal. He exhibits no distension and no mass. There is no tenderness. There is no rebound and no guarding.  Musculoskeletal: Normal range of motion. He exhibits no edema and no tenderness.  Lymphadenopathy:    He has no cervical adenopathy.  Neurological: He is alert and oriented to person, place, and time. He has normal reflexes. No cranial nerve deficit. He  exhibits normal muscle tone. Coordination normal.  Skin: Skin is warm and dry. No rash noted.  Psychiatric: He has a normal mood and affect. His behavior is normal. Judgment and thought content normal.      .avpu    Assessment & Plan:  Bronchitis, acute Biaxin and cough syrup OTC meds   DM-2

## 2011-02-08 NOTE — Assessment & Plan Note (Signed)
Biaxin and cough syrup OTC meds

## 2011-02-08 NOTE — Patient Instructions (Signed)

## 2011-02-11 ENCOUNTER — Telehealth: Payer: Self-pay

## 2011-02-11 NOTE — Telephone Encounter (Signed)
[facility] Triage Call Report Triage Record Num: [id number] Operator: Elita Boone Patient Name: Allen Myers Call Date & Time: 02/08/2011 4:29:42PM Patient Phone: PCP: Sonda Primes Patient Gender: Male PCP Fax : [phone] Patient DOB: November 11, 1945 Practice Name: [facility] Reason for Call: [facility] to report that prescription for antiboitc did not come through. Pt called and reports that office paperworks states Clarithromycin/Biaxin 500mg  tabs 2 x day for URI. Dr. Tawanna Cooler called and order received for Clarithromycin 500mg  2 x day #20. [facility] to [facility]. Protocol(s) Used: Medication Question Calls, No Triage (Adults) Recommended Outcome per Protocol: Call Provider Immediately Reason for Outcome: [1] Urgent new prescription or refill (likelihood of harm to patient if not taken) AND [2] triager unable to fill per unit policy Care Advice: ~ 02/08/2011 5:57:40PM Page 1 of 1 [email or web address]

## 2011-02-18 ENCOUNTER — Encounter: Payer: Self-pay | Admitting: Internal Medicine

## 2011-02-18 DIAGNOSIS — Z0000 Encounter for general adult medical examination without abnormal findings: Secondary | ICD-10-CM | POA: Insufficient documentation

## 2011-02-19 ENCOUNTER — Encounter: Payer: Self-pay | Admitting: Internal Medicine

## 2011-02-19 ENCOUNTER — Ambulatory Visit (INDEPENDENT_AMBULATORY_CARE_PROVIDER_SITE_OTHER): Payer: BC Managed Care – PPO | Admitting: Internal Medicine

## 2011-02-19 DIAGNOSIS — E119 Type 2 diabetes mellitus without complications: Secondary | ICD-10-CM

## 2011-02-19 DIAGNOSIS — I10 Essential (primary) hypertension: Secondary | ICD-10-CM

## 2011-02-19 DIAGNOSIS — R5381 Other malaise: Secondary | ICD-10-CM

## 2011-02-19 DIAGNOSIS — Z0000 Encounter for general adult medical examination without abnormal findings: Secondary | ICD-10-CM

## 2011-02-19 DIAGNOSIS — R5383 Other fatigue: Secondary | ICD-10-CM | POA: Insufficient documentation

## 2011-02-19 DIAGNOSIS — J209 Acute bronchitis, unspecified: Secondary | ICD-10-CM | POA: Insufficient documentation

## 2011-02-19 MED ORDER — MOXIFLOXACIN HCL 400 MG PO TABS: 400.0000 mg | ORAL_TABLET | Freq: Every day | ORAL | Status: AC

## 2011-02-19 NOTE — Assessment & Plan Note (Signed)
Moderate symtpoms, with severe fatigue likely due to this;  Cant r/o pna, pt declines cxr;  For avelox course,  to f/u any worsening symptoms or concerns

## 2011-02-19 NOTE — Assessment & Plan Note (Signed)
Rather dramatic today, likely assoc with acute illness, last labs reviwed with pt,  Exam o/w benign, ok to cont with tx as above,  to f/u any worsening symptoms or concerns  Lab Results  Component Value Date   WBC 7.3 06/07/2010   HGB 15.1 06/07/2010   HCT 43.5 06/07/2010   PLT 161.0 06/07/2010   CHOL 115 12/05/2010   TRIG 102.0 12/05/2010   HDL 35.70* 12/05/2010   LDLDIRECT 72.2 12/07/2007   ALT 26 06/07/2010   AST 24 06/07/2010   NA 139 12/05/2010   K 5.2* 12/05/2010   CL 105 12/05/2010   CREATININE 0.8 12/05/2010   BUN 14 12/05/2010   CO2 29 12/05/2010   TSH 1.11 06/07/2010   PSA 1.81 06/07/2010   HGBA1C 7.3* 12/05/2010   MICROALBUR 2.6* 06/07/2010

## 2011-02-19 NOTE — Assessment & Plan Note (Signed)
.  stable overall by hx and exam, most recent lab reviewed with pt, and pt to continue medical treatment as before  BP Readings from Last 3 Encounters:  02/19/11 114/76  02/08/11 130/82  12/12/10 110/62

## 2011-02-19 NOTE — Assessment & Plan Note (Signed)
With cbg's slightly higher in the 150's with acute illness, but overall stable overall by hx and exam, most recent lab reviewed with pt, and pt to continue medical treatment as before  Lab Results  Component Value Date   HGBA1C 7.3* 12/05/2010

## 2011-02-19 NOTE — Patient Instructions (Addendum)
Take all new medications as prescribed Continue all other medications as before Please return in 3 mo with Lab testing done 3-5 days before  

## 2011-02-24 ENCOUNTER — Encounter: Payer: Self-pay | Admitting: Internal Medicine

## 2011-02-24 NOTE — Progress Notes (Signed)
Subjective:    Patient ID: Allen Myers, male    DOB: 1945-11-19, 65 y.o.   MRN: [id number]  HPI Here with acute onset mild to mod 2-3 days ST, HA, general weakness and malaise, with prod cough greenish sputum, but Pt denies chest pain, increased sob or doe, wheezing, orthopnea, PND, increased LE swelling, palpitations, dizziness or syncope. C/o marked fatigue as well, but denies signficant hypersomnolence. Here to f/u; overall doing ok,  Pt denies chest pain, increased sob or doe, wheezing, orthopnea, PND, increased LE swelling, palpitations, dizziness or syncope.  Pt denies new neurological symptoms such as new headache, or facial or extremity weakness or numbness   Pt denies polydipsia, polyuria, or low sugar symptoms such as weakness or confusion improved with po intake.  Pt states overall good compliance with meds, trying to follow lower cholesterol, diabetic diet, wt overall stable but little exercise however. Past Medical History  Diagnosis Date  . ALLERGIC RHINITIS 05/07/2007  . DEPRESSION 05/07/2007  . DIABETES MELLITUS, TYPE II 05/07/2007  . HYPERLIPIDEMIA 05/07/2007  . HYPERTENSION 05/16/2008  . HYPOTHYROIDISM 05/07/2007  . SLEEP APNEA, OBSTRUCTIVE 05/07/2007  . COLONIC POLYPS, HX OF 12/07/2007  . DIVERTICULITIS, HX OF 05/07/2007  . DIVERTICULOSIS, COLON 12/07/2007  . ERECTILE DYSFUNCTION 05/07/2007  . NEPHROLITHIASIS, HX OF 12/07/2007   No past surgical history on file.  reports that he has quit smoking. He does not have any smokeless tobacco history on file. His alcohol and drug histories not on file. family history is not on file. No Known Allergies Current Outpatient Prescriptions on File Prior to Visit  Medication Sig Dispense Refill  . acyclovir (ZOVIRAX) 200 MG capsule Take 200 mg by mouth daily.        Marland Kitchen aspirin 81 MG EC tablet Take 81 mg by mouth daily.        Marland Kitchen buPROPion (WELLBUTRIN XL) 300 MG 24 hr tablet Take 300 mg by mouth daily.        Marland Kitchen glimepiride (AMARYL) 4 MG tablet  Take 4 mg by mouth daily before breakfast.        . levothyroxine (SYNTHROID, LEVOTHROID) 175 MCG tablet Take 175 mcg by mouth daily.        Marland Kitchen lisinopril (PRINIVIL,ZESTRIL) 5 MG tablet Take 5 mg by mouth daily.        . metFORMIN (GLUCOPHAGE) 1000 MG tablet Take 1,000 mg by mouth 2 (two) times daily with a meal.        . pioglitazone (ACTOS) 45 MG tablet Take 1 tablet (45 mg total) by mouth daily.  90 tablet  3  . lovastatin (MEVACOR) 20 MG tablet Take 20 mg by mouth at bedtime.         Review of Systems All otherwise neg per pt     Objective:   Physical Exam BP 114/76  Pulse 80  Temp(Src) 97.5 F (36.4 C) (Oral)  Ht 6\' 1"  (1.854 m)  Wt 253 lb (114.76 kg)  BMI 33.38 kg/m2  SpO2 94% Physical Exam  VS noted, mild ill appearing, fatigued Constitutional: Pt appears well-developed and well-nourished.  HENT: Head: Normocephalic.  Right Ear: External ear normal.  Left Ear: External ear normal. \Bilat tm's mild erythema.  Sinus nontender.  Pharynx mild erythema Eyes: Conjunctivae and EOM are normal. Pupils are equal, round, and reactive to light.  Neck: Normal range of motion. Neck supple.  Cardiovascular: Normal rate and regular rhythm.   Pulmonary/Chest: Effort normal and breath sounds normal.  Neurological: Pt is alert.  No cranial nerve deficit.  Skin: Skin is warm. No erythema.  Psychiatric: Pt behavior is normal. Thought content normal. 1+ nervous        Assessment & Plan:

## 2011-03-01 NOTE — Procedures (Signed)
NAME:  Allen Myers, Allen Myers NO.:  [id number]   MEDICAL RECORD NO.:  [id number]          PATIENT TYPE:  OUT   LOCATION:  SLEEP CENTER                 FACILITY:  [facility]   PHYSICIAN:  Marcelyn Bruins, M.D. [facility] DATE OF BIRTH:  02/15/1946   DATE OF ADMISSION:  05/21/2004  DATE OF DISCHARGE:  05/21/2004                              NOCTURNAL POLYSOMNOGRAM   REFERRING PHYSICIAN:  Dr. Oliver Barre   INDICATION FOR THE STUDY:  Hypersomnia with sleep apnea.   EPWORTH SLEEPINESS SCORE:  12   SLEEP ARCHITECTURE:  The patient had a total sleep time of 384 minutes with  decreased REM and slow wave sleep.  Sleep onset latency was normal as was  the REM onset latency.   IMPRESSION:  1. Moderate obstructive sleep apnea/hypopnea syndrome with oxygen     desaturation as low as 81%.  Events were not positional nor were they     rapid eye movement related.  2. Moderate snoring noted during the study.  3. No clinically significant cardiac arrhythmia.  4. Small numbers of leg jerks with very little sleep disruption.                                   ______________________________                                Marcelyn Bruins, M.D. [facility]     KC/MEDQ  D:  06/05/2004 11:08:14  T:  06/05/2004 22:44:18  Job:  [id number]

## 2011-03-04 ENCOUNTER — Other Ambulatory Visit: Payer: Self-pay

## 2011-03-04 MED ORDER — GLIMEPIRIDE 4 MG PO TABS: 4.0000 mg | ORAL_TABLET | Freq: Every day | ORAL | Status: DC

## 2011-03-26 ENCOUNTER — Other Ambulatory Visit: Payer: Self-pay | Admitting: *Deleted

## 2011-03-26 MED ORDER — GLIMEPIRIDE 4 MG PO TABS: 4.0000 mg | ORAL_TABLET | Freq: Every day | ORAL | Status: DC

## 2011-03-26 NOTE — Telephone Encounter (Signed)
Pt is requesting Glimepiride rx be sent to [facility]

## 2011-06-07 ENCOUNTER — Other Ambulatory Visit: Payer: Self-pay

## 2011-06-13 ENCOUNTER — Encounter: Payer: Self-pay | Admitting: Internal Medicine

## 2011-06-13 ENCOUNTER — Other Ambulatory Visit (INDEPENDENT_AMBULATORY_CARE_PROVIDER_SITE_OTHER): Payer: BC Managed Care – PPO

## 2011-06-13 DIAGNOSIS — E119 Type 2 diabetes mellitus without complications: Secondary | ICD-10-CM

## 2011-06-13 DIAGNOSIS — Z0000 Encounter for general adult medical examination without abnormal findings: Secondary | ICD-10-CM

## 2011-06-13 LAB — URINALYSIS, ROUTINE W REFLEX MICROSCOPIC
Ketones, ur: NEGATIVE
Specific Gravity, Urine: >=1.03 (ref 1.000–1.030)
Total Protein, Urine: NEGATIVE
Urine Glucose: NEGATIVE
Urobilinogen, UA: 0.2 (ref 0.0–1.0)
pH: 5.5 (ref 5.0–8.0)

## 2011-06-13 LAB — LIPID PANEL: Cholesterol: 133 mg/dL (ref 0–200)

## 2011-06-13 LAB — CBC WITH DIFFERENTIAL/PLATELET
Basophils Relative: 0.5 % (ref 0.0–3.0)
Eosinophils Relative: 3 % (ref 0.0–5.0)
Hemoglobin: 15 g/dL (ref 13.0–17.0)
Lymphocytes Relative: 25 % (ref 12.0–46.0)
MCV: 87.9 fl (ref 78.0–100.0)
Neutrophils Relative %: 62.5 % (ref 43.0–77.0)
RBC: 5.08 Mil/uL (ref 4.22–5.81)
WBC: 7.6 10*3/uL (ref 4.5–10.5)

## 2011-06-13 LAB — BASIC METABOLIC PANEL
BUN: 19 mg/dL (ref 6–23)
Calcium: 9.2 mg/dL (ref 8.4–10.5)
Creatinine, Ser: 0.9 mg/dL (ref 0.4–1.5)
GFR: 88.84 mL/min (ref >60.00)

## 2011-06-13 LAB — MICROALBUMIN / CREATININE URINE RATIO
Creatinine,U: 171.2 mg/dL
Microalb Creat Ratio: 0.5 mg/g (ref 0.0–30.0)

## 2011-06-13 LAB — HEPATIC FUNCTION PANEL
ALT: 23 U/L (ref 0–53)
Alkaline Phosphatase: 79 U/L (ref 39–117)
Bilirubin, Direct: 0.2 mg/dL (ref 0.0–0.3)
Total Protein: 7.2 g/dL (ref 6.0–8.3)

## 2011-06-13 LAB — TSH: TSH: 0.93 u[IU]/mL (ref 0.35–5.50)

## 2011-06-19 ENCOUNTER — Encounter: Payer: Self-pay | Admitting: Internal Medicine

## 2011-06-19 ENCOUNTER — Ambulatory Visit (INDEPENDENT_AMBULATORY_CARE_PROVIDER_SITE_OTHER): Payer: BC Managed Care – PPO | Admitting: Internal Medicine

## 2011-06-19 VITALS — BP 100/64 | HR 73 | Temp 98.3°F | Ht 73.0 in | Wt 260.1 lb

## 2011-06-19 DIAGNOSIS — Z23 Encounter for immunization: Secondary | ICD-10-CM

## 2011-06-19 DIAGNOSIS — E119 Type 2 diabetes mellitus without complications: Secondary | ICD-10-CM

## 2011-06-19 DIAGNOSIS — Z0000 Encounter for general adult medical examination without abnormal findings: Secondary | ICD-10-CM

## 2011-06-19 MED ORDER — HYDROCORTISONE ACE-PRAMOXINE 2.5-1 % RE CREA: TOPICAL_CREAM | Freq: Three times a day (TID) | RECTAL | Status: AC

## 2011-06-19 NOTE — Assessment & Plan Note (Signed)

## 2011-06-19 NOTE — Progress Notes (Signed)
Subjective:    Patient ID: Allen Myers, male    DOB: 08-08-1946, 65 y.o.   MRN: [id number]  HPI  Here for wellness and f/u;  Overall doing ok;  Pt denies CP, worsening SOB, DOE, wheezing, orthopnea, PND, worsening LE edema, palpitations, dizziness or syncope.  Pt denies neurological change such as new Headache, facial or extremity weakness.  Pt denies polydipsia, polyuria, or low sugar symptoms. Pt states overall good compliance with treatment and medications, good tolerability, and trying to follow lower cholesterol diet.  Pt denies worsening depressive symptoms, suicidal ideation or panic. No fever, wt loss, night sweats, loss of appetite, or other constitutional symptoms.  Pt states good ability with ADL's, low fall risk, home safety reviewed and adequate, no significant changes in hearing or vision, and occasionally active with exercise. CBGs in the lower 100's. No other new complaints Past Medical History  Diagnosis Date  . ALLERGIC RHINITIS 05/07/2007  . DEPRESSION 05/07/2007  . DIABETES MELLITUS, TYPE II 05/07/2007  . HYPERLIPIDEMIA 05/07/2007  . HYPERTENSION 05/16/2008  . HYPOTHYROIDISM 05/07/2007  . SLEEP APNEA, OBSTRUCTIVE 05/07/2007  . COLONIC POLYPS, HX OF 12/07/2007  . DIVERTICULITIS, HX OF 05/07/2007  . DIVERTICULOSIS, COLON 12/07/2007  . ERECTILE DYSFUNCTION 05/07/2007  . NEPHROLITHIASIS, HX OF 12/07/2007   No past surgical history on file.  reports that he has quit smoking. He does not have any smokeless tobacco history on file. He reports that he drinks alcohol. He reports that he does not use illicit drugs. family history includes Cancer in his brother and cousin. No Known Allergies Current Outpatient Prescriptions on File Prior to Visit  Medication Sig Dispense Refill  . aspirin 81 MG EC tablet Take 81 mg by mouth daily.        Marland Kitchen buPROPion (WELLBUTRIN XL) 300 MG 24 hr tablet Take 300 mg by mouth daily.        Marland Kitchen glimepiride (AMARYL) 4 MG tablet Take 1 tablet (4 mg total) by mouth  daily before breakfast.  90 tablet  3  . levothyroxine (SYNTHROID, LEVOTHROID) 175 MCG tablet Take 175 mcg by mouth daily.        Marland Kitchen lisinopril (PRINIVIL,ZESTRIL) 5 MG tablet Take 5 mg by mouth daily.        Marland Kitchen lovastatin (MEVACOR) 20 MG tablet Take 20 mg by mouth at bedtime.        . metFORMIN (GLUCOPHAGE) 1000 MG tablet Take 1,000 mg by mouth 2 (two) times daily with a meal.        . pioglitazone (ACTOS) 45 MG tablet Take 1 tablet (45 mg total) by mouth daily.  90 tablet  3  . acyclovir (ZOVIRAX) 200 MG capsule Take 200 mg by mouth daily.         Review of Systems Review of Systems  Constitutional: Negative for diaphoresis, activity change, appetite change and unexpected weight change.  HENT: Negative for hearing loss, ear pain, facial swelling, mouth sores and neck stiffness.   Eyes: Negative for pain, redness and visual disturbance.  Respiratory: Negative for shortness of breath and wheezing.   Cardiovascular: Negative for chest pain and palpitations.  Gastrointestinal: Negative for diarrhea, blood in stool, abdominal distention and rectal pain.  Genitourinary: Negative for hematuria, flank pain and decreased urine volume.  Musculoskeletal: Negative for myalgias and joint swelling.  Skin: Negative for color change and wound.  Neurological: Negative for syncope and numbness.  Hematological: Negative for adenopathy.  Psychiatric/Behavioral: Negative for hallucinations, self-injury, decreased concentration and agitation.  Objective:   Physical Exam BP 100/64  Pulse 73  Temp(Src) 98.3 F (36.8 C) (Oral)  Ht 6\' 1"  (1.854 m)  Wt 260 lb 2 oz (117.992 kg)  BMI 34.32 kg/m2  SpO2 94% \Physical Exam  VS noted Constitutional: Pt is oriented to person, place, and time. Appears well-developed and well-nourished.  HENT:  Head: Normocephalic and atraumatic.  Right Ear: External ear normal.  Left Ear: External ear normal.  Nose: Nose normal.  Mouth/Throat: Oropharynx is clear and moist.   Eyes: Conjunctivae and EOM are normal. Pupils are equal, round, and reactive to light.  Neck: Normal range of motion. Neck supple. No JVD present. No tracheal deviation present.  Cardiovascular: Normal rate, regular rhythm, normal heart sounds and intact distal pulses.   Pulmonary/Chest: Effort normal and breath sounds normal.  Abdominal: Soft. Bowel sounds are normal. There is no tenderness.  Musculoskeletal: Normal range of motion. Exhibits no edema.  Lymphadenopathy:  Has no cervical adenopathy.  Neurological: Pt is alert and oriented to person, place, and time. Pt has normal reflexes. No cranial nerve deficit.  Skin: Skin is warm and dry. No rash noted.  Psychiatric:  Has  normal mood and affect. Behavior is normal.         Assessment & Plan:

## 2011-06-19 NOTE — Assessment & Plan Note (Signed)
Mild suboptimal control, declines to add insulin today,  Continue all other medications as before, to focus on wt loss and diet

## 2011-06-19 NOTE — Patient Instructions (Addendum)
You had the flu shot today Your EKG was good today Take all new medications as prescribed - the cream was sent to the pharmacy Continue all other medications as before Please return in 6 mo with Lab testing done 3-5 days before

## 2011-06-19 NOTE — Progress Notes (Signed)
Addended by: Scharlene Gloss B on: 06/19/2011 02:03 PM   Modules accepted: Orders

## 2011-06-21 ENCOUNTER — Other Ambulatory Visit: Payer: Self-pay | Admitting: Internal Medicine

## 2011-07-29 ENCOUNTER — Telehealth: Payer: Self-pay | Admitting: *Deleted

## 2011-07-29 NOTE — Telephone Encounter (Signed)
Pt has scheduled appt with Dr Debby Bud tomorrow 07/30/2011

## 2011-07-29 NOTE — Telephone Encounter (Signed)
Pt left VM req a call back to discuss her blood sugar. Left mess to call office back.

## 2011-07-30 ENCOUNTER — Ambulatory Visit: Payer: BC Managed Care – PPO | Admitting: Internal Medicine

## 2011-11-18 ENCOUNTER — Other Ambulatory Visit: Payer: Self-pay

## 2011-11-18 MED ORDER — METFORMIN HCL 1000 MG PO TABS: 1000.0000 mg | ORAL_TABLET | Freq: Two times a day (BID) | ORAL | Status: DC

## 2011-11-18 MED ORDER — LOVASTATIN 20 MG PO TABS: 20.0000 mg | ORAL_TABLET | Freq: Every day | ORAL | Status: DC

## 2011-11-18 MED ORDER — GLIMEPIRIDE 4 MG PO TABS: 4.0000 mg | ORAL_TABLET | Freq: Every day | ORAL | Status: DC

## 2011-11-18 MED ORDER — PIOGLITAZONE HCL 45 MG PO TABS: 45.0000 mg | ORAL_TABLET | Freq: Every day | ORAL | Status: DC

## 2011-11-18 MED ORDER — LISINOPRIL 5 MG PO TABS: 5.0000 mg | ORAL_TABLET | Freq: Every day | ORAL | Status: DC

## 2011-11-21 ENCOUNTER — Other Ambulatory Visit: Payer: Self-pay

## 2011-11-21 MED ORDER — BUPROPION HCL ER (XL) 300 MG PO TB24: 300.0000 mg | ORAL_TABLET | Freq: Every day | ORAL | Status: DC

## 2011-11-21 MED ORDER — LEVOTHYROXINE SODIUM 175 MCG PO TABS: 175.0000 ug | ORAL_TABLET | Freq: Every day | ORAL | Status: DC

## 2011-12-04 ENCOUNTER — Telehealth: Payer: Self-pay | Admitting: Internal Medicine

## 2011-12-04 MED ORDER — LOVASTATIN 20 MG PO TABS: 20.0000 mg | ORAL_TABLET | Freq: Every day | ORAL | Status: DC

## 2011-12-04 NOTE — Telephone Encounter (Signed)
Pt is requesting Lovostatin prescription sent to [facility] delivery pharm--Pt ph# [phone]

## 2011-12-16 ENCOUNTER — Other Ambulatory Visit (INDEPENDENT_AMBULATORY_CARE_PROVIDER_SITE_OTHER): Payer: Managed Care, Other (non HMO)

## 2011-12-16 DIAGNOSIS — E119 Type 2 diabetes mellitus without complications: Secondary | ICD-10-CM

## 2011-12-16 LAB — LIPID PANEL
Cholesterol: 140 mg/dL (ref 0–200)
LDL Cholesterol: 73 mg/dL (ref 0–99)
Total CHOL/HDL Ratio: 3
Triglycerides: 129 mg/dL (ref 0.0–149.0)
VLDL: 25.8 mg/dL (ref 0.0–40.0)

## 2011-12-16 LAB — BASIC METABOLIC PANEL
BUN: 19 mg/dL (ref 6–23)
Creatinine, Ser: 1.1 mg/dL (ref 0.4–1.5)
GFR: 72.79 mL/min (ref >60.00)
Potassium: 4.7 mEq/L (ref 3.5–5.1)

## 2011-12-16 LAB — HEMOGLOBIN A1C: Hgb A1c MFr Bld: 7.6 % — ABNORMAL HIGH (ref 4.6–6.5)

## 2011-12-20 ENCOUNTER — Ambulatory Visit (INDEPENDENT_AMBULATORY_CARE_PROVIDER_SITE_OTHER): Payer: Managed Care, Other (non HMO) | Admitting: Internal Medicine

## 2011-12-20 ENCOUNTER — Encounter: Payer: Self-pay | Admitting: Internal Medicine

## 2011-12-20 VITALS — BP 104/60 | HR 68 | Temp 98.1°F | Ht 72.0 in | Wt 266.0 lb

## 2011-12-20 DIAGNOSIS — I10 Essential (primary) hypertension: Secondary | ICD-10-CM

## 2011-12-20 DIAGNOSIS — E785 Hyperlipidemia, unspecified: Secondary | ICD-10-CM

## 2011-12-20 DIAGNOSIS — Z0000 Encounter for general adult medical examination without abnormal findings: Secondary | ICD-10-CM

## 2011-12-20 DIAGNOSIS — M549 Dorsalgia, unspecified: Secondary | ICD-10-CM

## 2011-12-20 DIAGNOSIS — E119 Type 2 diabetes mellitus without complications: Secondary | ICD-10-CM

## 2011-12-20 NOTE — Progress Notes (Signed)
Subjective:    Patient ID: Allen Myers, male    DOB: 1946/06/26, 66 y.o.   MRN: [id number]  HPI Here to f/u; overall doing ok,  Pt denies chest pain, increased sob or doe, wheezing, orthopnea, PND, increased LE swelling, palpitations, dizziness or syncope.  Pt denies new neurological symptoms such as new headache, or facial or extremity weakness or numbness   Pt denies polydipsia, polyuria, or low sugar symptoms such as weakness or confusion improved with po intake.  Pt states overall good compliance with meds, trying to follow lower cholesterol, diabetic diet.   Pt continues to have recurring LBP without change in severity fur increased freq for 2 mo, but no bowel or bladder change, fever, wt loss,  worsening LE pain/numbness/weakness, gait change or falls,   Not taking the actos 45 mg. Has some less exercise, wt up 6 lbs since last 6 mo.  [facility] to [facility]   Past Medical History  Diagnosis Date  . ALLERGIC RHINITIS 05/07/2007  . DEPRESSION 05/07/2007  . DIABETES MELLITUS, TYPE II 05/07/2007  . HYPERLIPIDEMIA 05/07/2007  . HYPERTENSION 05/16/2008  . HYPOTHYROIDISM 05/07/2007  . SLEEP APNEA, OBSTRUCTIVE 05/07/2007  . COLONIC POLYPS, HX OF 12/07/2007  . DIVERTICULITIS, HX OF 05/07/2007  . DIVERTICULOSIS, COLON 12/07/2007  . ERECTILE DYSFUNCTION 05/07/2007  . NEPHROLITHIASIS, HX OF 12/07/2007   No past surgical history on file.  reports that he has quit smoking. He does not have any smokeless tobacco history on file. He reports that he drinks alcohol. He reports that he does not use illicit drugs. family history includes Cancer in his brother and cousin. No Known Allergies Current Outpatient Prescriptions on File Prior to Visit  Medication Sig Dispense Refill  . acyclovir (ZOVIRAX) 200 MG capsule Take 200 mg by mouth daily.        Marland Kitchen aspirin 81 MG EC tablet Take 81 mg by mouth daily.        Marland Kitchen buPROPion (WELLBUTRIN XL) 300 MG 24 hr tablet Take 1 tablet (300 mg  total) by mouth daily.  90 tablet  3  . glimepiride (AMARYL) 4 MG tablet Take 1 tablet (4 mg total) by mouth daily before breakfast.  90 tablet  3  . levothyroxine (SYNTHROID, LEVOTHROID) 175 MCG tablet Take 1 tablet (175 mcg total) by mouth daily.  90 tablet  3  . lisinopril (PRINIVIL,ZESTRIL) 5 MG tablet Take 1 tablet (5 mg total) by mouth daily.  90 tablet  3  . lovastatin (MEVACOR) 20 MG tablet Take 1 tablet (20 mg total) by mouth at bedtime.  90 tablet  3  . metFORMIN (GLUCOPHAGE) 1000 MG tablet Take 1 tablet (1,000 mg total) by mouth 2 (two) times daily with a meal.  180 tablet  3  . pioglitazone (ACTOS) 45 MG tablet Take 1 tablet (45 mg total) by mouth daily.  90 tablet  3   Review of Systems Review of Systems  Constitutional: Negative for diaphoresis and unexpected weight change.  HENT: Negative for drooling and tinnitus.   Eyes: Negative for photophobia and visual disturbance.  Respiratory: Negative for choking and stridor.   Gastrointestinal: Negative for vomiting and blood in stool.  Genitourinary: Negative for hematuria and decreased urine volume.    Objective:   Physical Exam BP 104/60  Pulse 68  Temp(Src) 98.1 F (36.7 C) (Oral)  Ht 6' (1.829 m)  Wt 266 lb (120.657 kg)  BMI 36.08 kg/m2  SpO2 95%  Physical Exam  VS noted, obese Constitutional: Pt appears well-developed and well-nourished.  HENT: Head: Normocephalic.  Right Ear: External ear normal.  Left Ear: External ear normal.  Eyes: Conjunctivae and EOM are normal. Pupils are equal, round, and reactive to light.  Neck: Normal range of motion. Neck supple.  Cardiovascular: Normal rate and regular rhythm.   Pulmonary/Chest: Effort normal and breath sounds normal.  Abd:  Soft, NT, non-distended, + BS Spine:  nontender Neurological: Pt is alert. No cranial nerve deficit. Motor/dtr/gait intact  Skin: Skin is warm. No erythema.  Psychiatric: Pt behavior is normal. Thought content normal.     Assessment & Plan:

## 2011-12-20 NOTE — Patient Instructions (Signed)
Continue all other medications as before Please increase your activity level , reduce calories overall and work on weight control Please return in 6 mo with Lab testing done 3-5 days before

## 2011-12-22 ENCOUNTER — Encounter: Payer: Self-pay | Admitting: Internal Medicine

## 2011-12-22 DIAGNOSIS — M549 Dorsalgia, unspecified: Secondary | ICD-10-CM | POA: Insufficient documentation

## 2011-12-22 NOTE — Assessment & Plan Note (Signed)
stable overall by hx and exam, most recent data reviewed with pt, and pt to continue medical treatment as before  BP Readings from Last 3 Encounters:  12/20/11 104/60  06/19/11 100/64  02/19/11 114/76

## 2011-12-22 NOTE — Assessment & Plan Note (Signed)
stable overall by hx and exam, most recent data reviewed with pt, and pt to continue medical treatment as before  Lab Results  Component Value Date   HGBA1C 7.6* 12/16/2011

## 2011-12-22 NOTE — Assessment & Plan Note (Addendum)
stable overall by hx and exam, most recent data reviewed with pt, and pt to continue medical treatment as before  Lab Results  Component Value Date   LDLCALC 73 12/16/2011

## 2011-12-22 NOTE — Assessment & Plan Note (Signed)
Recurrent LBP with some increased freq o/w no change, no neuro change;  Mild overall, Continue all other medications as before and sees chiropracter on regular basis; consider MRI or surgury if worsens or exam changes

## 2011-12-23 ENCOUNTER — Ambulatory Visit: Payer: BC Managed Care – PPO | Admitting: Internal Medicine

## 2012-06-12 ENCOUNTER — Other Ambulatory Visit (INDEPENDENT_AMBULATORY_CARE_PROVIDER_SITE_OTHER): Payer: Managed Care, Other (non HMO)

## 2012-06-12 DIAGNOSIS — E119 Type 2 diabetes mellitus without complications: Secondary | ICD-10-CM

## 2012-06-12 DIAGNOSIS — Z0000 Encounter for general adult medical examination without abnormal findings: Secondary | ICD-10-CM

## 2012-06-12 LAB — CBC WITH DIFFERENTIAL/PLATELET
Eosinophils Relative: 2.6 % (ref 0.0–5.0)
HCT: 44.9 % (ref 39.0–52.0)
Hemoglobin: 14.7 g/dL (ref 13.0–17.0)
Lymphs Abs: 1.8 10*3/uL (ref 0.7–4.0)
MCV: 88.2 fl (ref 78.0–100.0)
Monocytes Relative: 9.1 % (ref 3.0–12.0)
Neutro Abs: 5.1 10*3/uL (ref 1.4–7.7)
RDW: 14.8 % — ABNORMAL HIGH (ref 11.5–14.6)
WBC: 7.9 10*3/uL (ref 4.5–10.5)

## 2012-06-12 LAB — URINALYSIS, ROUTINE W REFLEX MICROSCOPIC
Specific Gravity, Urine: >=1.03 (ref 1.000–1.030)
Total Protein, Urine: NEGATIVE
Urine Glucose: 100
Urobilinogen, UA: 1 (ref 0.0–1.0)
pH: 6 (ref 5.0–8.0)

## 2012-06-12 LAB — BASIC METABOLIC PANEL
Calcium: 9.1 mg/dL (ref 8.4–10.5)
GFR: 83.26 mL/min (ref >60.00)
Glucose, Bld: 183 mg/dL — ABNORMAL HIGH (ref 70–99)
Sodium: 137 mEq/L (ref 135–145)

## 2012-06-12 LAB — MICROALBUMIN / CREATININE URINE RATIO: Microalb, Ur: 1.2 mg/dL (ref 0.0–1.9)

## 2012-06-12 LAB — HEPATIC FUNCTION PANEL
Albumin: 3.8 g/dL (ref 3.5–5.2)
Alkaline Phosphatase: 80 U/L (ref 39–117)
Total Bilirubin: 0.5 mg/dL (ref 0.3–1.2)

## 2012-06-12 LAB — LIPID PANEL
HDL: 31.9 mg/dL — ABNORMAL LOW (ref >39.00)
LDL Cholesterol: 60 mg/dL (ref 0–99)
VLDL: 29 mg/dL (ref 0.0–40.0)

## 2012-06-12 LAB — HEMOGLOBIN A1C: Hgb A1c MFr Bld: 7.8 % — ABNORMAL HIGH (ref 4.6–6.5)

## 2012-06-17 ENCOUNTER — Encounter: Payer: Self-pay | Admitting: Internal Medicine

## 2012-06-17 ENCOUNTER — Ambulatory Visit (INDEPENDENT_AMBULATORY_CARE_PROVIDER_SITE_OTHER): Payer: Managed Care, Other (non HMO) | Admitting: Internal Medicine

## 2012-06-17 VITALS — BP 102/70 | HR 66 | Temp 98.3°F | Ht 73.0 in | Wt 273.4 lb

## 2012-06-17 DIAGNOSIS — Z0000 Encounter for general adult medical examination without abnormal findings: Secondary | ICD-10-CM

## 2012-06-17 DIAGNOSIS — D369 Benign neoplasm, unspecified site: Secondary | ICD-10-CM

## 2012-06-17 DIAGNOSIS — E119 Type 2 diabetes mellitus without complications: Secondary | ICD-10-CM

## 2012-06-17 MED ORDER — GLIPIZIDE ER 10 MG PO TB24: 10.0000 mg | ORAL_TABLET | Freq: Every day | ORAL | Status: DC

## 2012-06-17 NOTE — Patient Instructions (Addendum)
You will be contacted regarding the referral for: colonoscopy Ok to finish the glimeparide 4 mg daily until the bottle is used up Then -  Take all new medications as prescribed - the new glipizide 10 mg ER  - 1 per day Continue all other medications as before Please have the pharmacy call with any refills you may need. Please continue your efforts at being more active, low cholesterol diabetic diet, and weight control. You are otherwise up to date with prevention Please return in 6 mo with Lab testing done 3-5 days before

## 2012-06-17 NOTE — Addendum Note (Signed)
Addended by: Scharlene Gloss B on: 06/17/2012 02:27 PM   Modules accepted: Orders

## 2012-06-17 NOTE — Assessment & Plan Note (Addendum)
Mild uncontrolled, after d/w pt - Declines insulin at this time;  Will try to maximize the sulfonylurea, Continue all other medications as before, delcines education class, f/u 6 mo, cont diet  Lab Results  Component Value Date   HGBA1C 7.8* 06/12/2012

## 2012-06-17 NOTE — Assessment & Plan Note (Signed)

## 2012-06-17 NOTE — Assessment & Plan Note (Signed)
For colonscopy as he is due, pt reqeusts [place]

## 2012-06-17 NOTE — Progress Notes (Signed)
Subjective:    Patient ID: Allen Myers, male    DOB: Jan 06, 1946, 66 y.o.   MRN: [id number]  HPI  Here for wellness and f/u;  Overall doing ok;  Pt denies CP, worsening SOB, DOE, wheezing, orthopnea, PND, worsening LE edema, palpitations, dizziness or syncope.  Pt denies neurological change such as new Headache, facial or extremity weakness.  Pt denies polydipsia, polyuria, or low sugar symptoms. Pt states overall good compliance with treatment and medications, good tolerability, and trying to follow lower cholesterol diet.  Pt denies worsening depressive symptoms, suicidal ideation or panic. No fever, wt loss, night sweats, loss of appetite, or other constitutional symptoms.  Pt states good ability with ADL's, low fall risk, home safety reviewed and adequate, no significant changes in hearing or vision, and occasionally active with exercise.   No acute complaints.  Has cont'd to gain wt over the years, admitted to non compliance with diet and exercise, has been to DM education class several yrs ago but admits he doesn't do it, declines going on insulin at this time Past Medical History  Diagnosis Date  . ALLERGIC RHINITIS 05/07/2007  . DEPRESSION 05/07/2007  . DIABETES MELLITUS, TYPE II 05/07/2007  . HYPERLIPIDEMIA 05/07/2007  . HYPERTENSION 05/16/2008  . HYPOTHYROIDISM 05/07/2007  . SLEEP APNEA, OBSTRUCTIVE 05/07/2007  . COLONIC POLYPS, HX OF 12/07/2007  . DIVERTICULITIS, HX OF 05/07/2007  . DIVERTICULOSIS, COLON 12/07/2007  . ERECTILE DYSFUNCTION 05/07/2007  . NEPHROLITHIASIS, HX OF 12/07/2007   No past surgical history on file.  reports that he has quit smoking. He does not have any smokeless tobacco history on file. He reports that he drinks alcohol. He reports that he does not use illicit drugs. family history includes Cancer in his brother and cousin. No Known Allergies Current Outpatient Prescriptions on File Prior to Visit  Medication Sig Dispense Refill  . aspirin 81 MG EC tablet Take 81 mg  by mouth daily.        Marland Kitchen buPROPion (WELLBUTRIN XL) 300 MG 24 hr tablet Take 1 tablet (300 mg total) by mouth daily.  90 tablet  3  . levothyroxine (SYNTHROID, LEVOTHROID) 175 MCG tablet Take 1 tablet (175 mcg total) by mouth daily.  90 tablet  3  . lisinopril (PRINIVIL,ZESTRIL) 5 MG tablet Take 1 tablet (5 mg total) by mouth daily.  90 tablet  3  . lovastatin (MEVACOR) 20 MG tablet Take 1 tablet (20 mg total) by mouth at bedtime.  90 tablet  3  . metFORMIN (GLUCOPHAGE) 1000 MG tablet Take 1 tablet (1,000 mg total) by mouth 2 (two) times daily with a meal.  180 tablet  3  . pioglitazone (ACTOS) 45 MG tablet Take 1 tablet (45 mg total) by mouth daily.  90 tablet  3  . acyclovir (ZOVIRAX) 200 MG capsule Take 200 mg by mouth daily.        Marland Kitchen glipiZIDE (GLUCOTROL XL) 10 MG 24 hr tablet Take 1 tablet (10 mg total) by mouth daily.  90 tablet  3   Review of Systems Review of Systems  Constitutional: Negative for diaphoresis, activity change, appetite change and unexpected weight change.  HENT: Negative for hearing loss, ear pain, facial swelling, mouth sores and neck stiffness.   Eyes: Negative for pain, redness and visual disturbance.  Respiratory: Negative for shortness of breath and wheezing.   Cardiovascular: Negative for chest pain and palpitations.  Gastrointestinal: Negative for diarrhea, blood in stool, abdominal distention and rectal pain.  Genitourinary: Negative for hematuria,  flank pain and decreased urine volume.  Musculoskeletal: Negative for myalgias and joint swelling.  Skin: Negative for color change and wound.  Neurological: Negative for syncope and numbness.  Hematological: Negative for adenopathy.  Psychiatric/Behavioral: Negative for hallucinations, self-injury, decreased concentration and agitation.      Objective:   Physical Exam BP 102/70  Pulse 66  Temp 98.3 F (36.8 C) (Oral)  Ht 6\' 1"  (1.854 m)  Wt 273 lb 6 oz (124.002 kg)  BMI 36.07 kg/m2  SpO2 93% Physical Exam   VS noted Constitutional: Pt is oriented to person, place, and time. Appears well-developed and well-nourished.  HENT:  Head: Normocephalic and atraumatic.  Right Ear: External ear normal.  Left Ear: External ear normal.  Nose: Nose normal.  Mouth/Throat: Oropharynx is clear and moist.  Eyes: Conjunctivae and EOM are normal. Pupils are equal, round, and reactive to light.  Neck: Normal range of motion. Neck supple. No JVD present. No tracheal deviation present.  Cardiovascular: Normal rate, regular rhythm, normal heart sounds and intact distal pulses.   Pulmonary/Chest: Effort normal and breath sounds normal.  Abdominal: Soft. Bowel sounds are normal. There is no tenderness.  Musculoskeletal: Normal range of motion. Exhibits no edema.  Lymphadenopathy:  Has no cervical adenopathy.  Neurological: Pt is alert and oriented to person, place, and time. Pt has normal reflexes. No cranial nerve deficit.  Skin: Skin is warm and dry. No rash noted.  Psychiatric:  Has  normal mood and affect. Behavior is normal.     Assessment & Plan:

## 2012-06-19 ENCOUNTER — Ambulatory Visit: Payer: Managed Care, Other (non HMO) | Admitting: Internal Medicine

## 2012-07-17 ENCOUNTER — Encounter: Payer: Self-pay | Admitting: Internal Medicine

## 2012-07-23 ENCOUNTER — Encounter: Payer: Self-pay | Admitting: Internal Medicine

## 2012-09-03 ENCOUNTER — Ambulatory Visit (AMBULATORY_SURGERY_CENTER): Payer: Managed Care, Other (non HMO) | Admitting: *Deleted

## 2012-09-03 ENCOUNTER — Encounter: Payer: Self-pay | Admitting: Internal Medicine

## 2012-09-03 VITALS — Ht 73.0 in | Wt 275.6 lb

## 2012-09-03 DIAGNOSIS — Z1211 Encounter for screening for malignant neoplasm of colon: Secondary | ICD-10-CM

## 2012-09-03 DIAGNOSIS — Z860100 Personal history of colon polyps, unspecified: Secondary | ICD-10-CM

## 2012-09-03 MED ORDER — MOVIPREP 100 G PO SOLR: 1.0000 | Freq: Once | ORAL | Status: DC

## 2012-09-17 ENCOUNTER — Encounter: Payer: Managed Care, Other (non HMO) | Admitting: Internal Medicine

## 2012-09-29 ENCOUNTER — Ambulatory Visit (INDEPENDENT_AMBULATORY_CARE_PROVIDER_SITE_OTHER): Payer: Managed Care, Other (non HMO) | Admitting: Internal Medicine

## 2012-09-29 ENCOUNTER — Encounter: Payer: Self-pay | Admitting: Internal Medicine

## 2012-09-29 ENCOUNTER — Telehealth: Payer: Self-pay | Admitting: Internal Medicine

## 2012-09-29 VITALS — BP 118/82 | HR 74 | Temp 97.7°F | Ht 73.0 in | Wt 270.8 lb

## 2012-09-29 DIAGNOSIS — B029 Zoster without complications: Secondary | ICD-10-CM

## 2012-09-29 MED ORDER — GABAPENTIN 100 MG PO CAPS: 100.0000 mg | ORAL_CAPSULE | Freq: Three times a day (TID) | ORAL | Status: DC

## 2012-09-29 MED ORDER — ACYCLOVIR 200 MG PO CAPS: 800.0000 mg | ORAL_CAPSULE | Freq: Every day | ORAL | Status: DC

## 2012-09-29 MED ORDER — ACYCLOVIR 200 MG PO CAPS: 200.0000 mg | ORAL_CAPSULE | Freq: Every day | ORAL | Status: DC

## 2012-09-29 NOTE — Patient Instructions (Signed)

## 2012-09-29 NOTE — Progress Notes (Signed)
Subjective:    Patient ID: Allen Myers, male    DOB: 1946/09/10, 66 y.o.   MRN: [id number]  HPI  Pt presents to the clinic today with c/o rash. He noticed an outbreak of blisters on his right buttock and right testicle 3 days ago. The rash feels like it is burning. The pain is 8/10. He has had shingles before and thinks this is the same. He has also had the shingles shot before. He denies fever, chills or body aches.  Review of Systems      Past Medical History  Diagnosis Date  . ALLERGIC RHINITIS 05/07/2007  . DEPRESSION 05/07/2007  . DIABETES MELLITUS, TYPE II 05/07/2007  . HYPERLIPIDEMIA 05/07/2007  . HYPOTHYROIDISM 05/07/2007  . SLEEP APNEA, OBSTRUCTIVE 05/07/2007  . COLONIC POLYPS, HX OF 12/07/2007  . DIVERTICULITIS, HX OF 05/07/2007  . DIVERTICULOSIS, COLON 12/07/2007  . ERECTILE DYSFUNCTION 05/07/2007  . NEPHROLITHIASIS, HX OF 12/07/2007  . HYPERTENSION 05/16/2008    pt denies    Current Outpatient Prescriptions  Medication Sig Dispense Refill  . acyclovir (ZOVIRAX) 200 MG capsule Take 200 mg by mouth daily.        Marland Kitchen aspirin 81 MG EC tablet Take 81 mg by mouth daily.        Marland Kitchen buPROPion (WELLBUTRIN XL) 300 MG 24 hr tablet Take 1 tablet (300 mg total) by mouth daily.  90 tablet  3  . glimepiride (AMARYL) 4 MG tablet Take 4 mg by mouth daily before breakfast.       . glipiZIDE (GLUCOTROL XL) 10 MG 24 hr tablet Take 1 tablet (10 mg total) by mouth daily.  90 tablet  3  . levothyroxine (SYNTHROID, LEVOTHROID) 175 MCG tablet Take 1 tablet (175 mcg total) by mouth daily.  90 tablet  3  . lisinopril (PRINIVIL,ZESTRIL) 5 MG tablet Take 1 tablet (5 mg total) by mouth daily.  90 tablet  3  . lovastatin (MEVACOR) 20 MG tablet Take 1 tablet (20 mg total) by mouth at bedtime.  90 tablet  3  . metFORMIN (GLUCOPHAGE) 1000 MG tablet Take 1 tablet (1,000 mg total) by mouth 2 (two) times daily with a meal.  180 tablet  3  . MOVIPREP 100 G SOLR Take 1 kit (100 g total) by mouth once. moviprep as  directed. No substitutions  1 kit  0  . pioglitazone (ACTOS) 45 MG tablet Take 1 tablet (45 mg total) by mouth daily.  90 tablet  3    No Known Allergies  Family History  Problem Relation Age of Onset  . Cancer Brother     prostate cancer  . Cancer Cousin     lung cancer  . Colon cancer Neg Hx   . Rectal cancer Neg Hx   . Stomach cancer Neg Hx   . Lung cancer Father     dad was a smoker  . Heart disease Mother     History   Social History  . Marital Status: Married    Spouse Name: N/A    Number of Children: 3  . Years of Education: N/A   Occupational History  . 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 Animator based in [place]    Social History Main Topics  . Smoking status: Former Games developer  . Smokeless tobacco: Never Used  . Alcohol Use: Yes     Comment: social  . Drug Use: No  . Sexually Active: Not on file   Other Topics Concern  . Not on file  Social History Narrative   3 daughters 91, 37, 49 and 2 grandchildren     Constitutional: Denies fever, malaise, fatigue, headache or abrupt weight changes.  Skin: Pt reports rash on right buttock and back of right testicle. Neurological: Pt reports burning sensation around lesions. Denies dizziness, difficulty with memory, difficulty with speech or problems with balance and coordination.   No other specific complaints in a complete review of systems (except as listed in HPI above).  Objective:   Physical Exam  BP 118/82  Pulse 74  Temp 97.7 F (36.5 C) (Oral)  Ht 6\' 1"  (1.854 m)  Wt 270 lb 12 oz (122.811 kg)  BMI 35.72 kg/m2  SpO2 95% Wt Readings from Last 3 Encounters:  09/29/12 270 lb 12 oz (122.811 kg)  09/03/12 275 lb 9.6 oz (125.011 kg)  06/17/12 273 lb 6 oz (124.002 kg)    General: Appears his stated age, well developed, well nourished in NAD. Skin: Clusters of scabbed over vesicles noted along the right buttock and on the posterior aspect of the right testicle. No weeping or exudate noted. Cardiovascular:  Normal rate and rhythm. S1,S2 noted.  No murmur, rubs or gallops noted. No JVD or BLE edema. No carotid bruits noted. Pulmonary/Chest: Normal effort and positive vesicular breath sounds. No respiratory distress. No wheezes, rales or ronchi noted.  Neurological: Alert and oriented. Cranial nerves II-XII intact. Coordination normal. +DTRs bilaterally.        Assessment & Plan:   Rash, likely due to shingles, new onset with additional workup required.  Acyclovir x 7 days Neurontin TID prn for PHN  RTC as needed or if symptoms persist

## 2012-09-29 NOTE — Telephone Encounter (Signed)
[place]; Phone: [phone]; Call Follows office visit 09/29/12 medication questions, states was told to take 4 Zovirax 5 times a day;  states prescription of Zovarix is for 200 mg, take one capsule by mouth 5 times a day, filled at [facility] [phone], call to pharmacist: Maureen Ralphs who verifies RX sent at [facility] for Zovirax 200 mg to take 1 (5 times a day) . Please clarify and call patient with correct dosage.

## 2012-09-29 NOTE — Telephone Encounter (Signed)
PATIENT NOTIFIED OF RX CLARIFICATION OF ACYCLOVIR MEDICATION PER R. BAITY AND DR. Felicity Coyer OF ONE CAPSULE 4 X DAY TO EQUAL 800MG  DAY FOR 7 DAYS.

## 2012-10-09 ENCOUNTER — Encounter: Payer: Managed Care, Other (non HMO) | Admitting: Internal Medicine

## 2012-10-14 HISTORY — PX: COLONOSCOPY: SHX174

## 2012-11-12 ENCOUNTER — Encounter: Payer: Self-pay | Admitting: Internal Medicine

## 2012-11-12 ENCOUNTER — Ambulatory Visit (AMBULATORY_SURGERY_CENTER): Payer: Managed Care, Other (non HMO) | Admitting: Internal Medicine

## 2012-11-12 VITALS — BP 122/76 | HR 59 | Temp 96.6°F | Resp 14 | Ht 73.0 in | Wt 275.0 lb

## 2012-11-12 DIAGNOSIS — D126 Benign neoplasm of colon, unspecified: Secondary | ICD-10-CM

## 2012-11-12 DIAGNOSIS — Z1211 Encounter for screening for malignant neoplasm of colon: Secondary | ICD-10-CM

## 2012-11-12 DIAGNOSIS — Z86010 Personal history of colonic polyps: Secondary | ICD-10-CM

## 2012-11-12 MED ORDER — SODIUM CHLORIDE 0.9 % IV SOLN: 500.0000 mL | INTRAVENOUS | Status: DC

## 2012-11-12 NOTE — Progress Notes (Signed)
Called to room to assist during endoscopic procedure.  Patient ID and intended procedure confirmed with present staff. Received instructions for my participation in the procedure from the performing physician.  

## 2012-11-12 NOTE — Op Note (Signed)
[facility] [address] [place], [address]   COLONOSCOPY PROCEDURE REPORT  PATIENT: Allen Myers, Allen Myers.  MR#: [id number] BIRTHDATE: [birth year] , 66  yrs. old GENDER: Male ENDOSCOPIST: Roxy Cedar, MD REFERRED XB:[id number] Program Recall PROCEDURE DATE:  11/12/2012 PROCEDURE:   Colonoscopy with snare polypectomy    x 1 ASA CLASS:   Class II INDICATIONS:Patient's personal history of adenomatous colon polyps (index 2003 w/ hpp; 2008 small TA). MEDICATIONS: MAC sedation, administered by CRNA and propofol (Diprivan) 150mg  IV  DESCRIPTION OF PROCEDURE:   After the risks benefits and alternatives of the procedure were thoroughly explained, informed consent was obtained.  A digital rectal exam revealed no abnormalities of the rectum.   The LB [id number]-[id number]  endoscope was introduced through the anus and advanced to the cecum, which was identified by both the appendix and ileocecal valve. No adverse events experienced.   The quality of the prep was good, using MoviPrep  The instrument was then slowly withdrawn as the colon was fully examined.      COLON FINDINGS: A diminutive polyp was found in the ascending colon. A polypectomy was performed with a cold snare.  The resection was complete and the polyp tissue was completely retrieved.   Severe diverticulosis was noted The finding was in the left colon.   The colon mucosa was otherwise normal.  Retroflexed views revealed no abnormalities. The time to cecum=7 minutes 03 seconds.  Withdrawal time=10 minutes 14 seconds.  The scope was withdrawn and the procedure completed. COMPLICATIONS: There were no complications.  ENDOSCOPIC IMPRESSION: 1.   Diminutive polyp was found in the ascending colon; polypectomy was performed with a cold snare 2.   Severe diverticulosis was noted in the left colon 3.   The colon mucosa was otherwise normal  RECOMMENDATIONS: 1. Follow up colonoscopy in 5 years   eSigned:   Roxy Cedar, MD 11/12/2012 9:39 AM   cc: Corwin Levins, MD and The Patient   PATIENT NAME:  Allen Myers, Allen Myers. MR#: [id number]

## 2012-11-12 NOTE — Patient Instructions (Addendum)

## 2012-11-12 NOTE — Progress Notes (Signed)
Lidocaine-40mg IV prior to Propofol InductionPropofol given over incremental dosages 

## 2012-11-12 NOTE — Progress Notes (Signed)
Patient did not experience any of the following events: a burn prior to discharge; a fall within the facility; wrong site/side/patient/procedure/implant event; or a hospital transfer or hospital admission upon discharge from the facility. (G8907) Patient did not have preoperative order for IV antibiotic SSI prophylaxis. (G8918)  

## 2012-11-13 ENCOUNTER — Telehealth: Payer: Self-pay | Admitting: *Deleted

## 2012-11-13 NOTE — Telephone Encounter (Signed)
No answer, left message to call office if questions or concerns. 

## 2012-11-17 ENCOUNTER — Encounter: Payer: Self-pay | Admitting: Internal Medicine

## 2012-12-17 ENCOUNTER — Ambulatory Visit: Payer: Managed Care, Other (non HMO) | Admitting: Internal Medicine

## 2012-12-21 ENCOUNTER — Other Ambulatory Visit: Payer: Self-pay

## 2012-12-21 MED ORDER — LISINOPRIL 5 MG PO TABS: 5.0000 mg | ORAL_TABLET | Freq: Every day | ORAL | Status: DC

## 2012-12-21 MED ORDER — LOVASTATIN 20 MG PO TABS: 20.0000 mg | ORAL_TABLET | Freq: Every day | ORAL | Status: DC

## 2012-12-21 MED ORDER — METFORMIN HCL 1000 MG PO TABS: 1000.0000 mg | ORAL_TABLET | Freq: Two times a day (BID) | ORAL | Status: DC

## 2012-12-21 MED ORDER — BUPROPION HCL ER (XL) 300 MG PO TB24: 300.0000 mg | ORAL_TABLET | Freq: Every day | ORAL | Status: DC

## 2012-12-21 MED ORDER — PIOGLITAZONE HCL 45 MG PO TABS: 45.0000 mg | ORAL_TABLET | Freq: Every day | ORAL | Status: DC

## 2012-12-21 MED ORDER — LEVOTHYROXINE SODIUM 175 MCG PO TABS: 175.0000 ug | ORAL_TABLET | Freq: Every day | ORAL | Status: DC

## 2012-12-21 NOTE — Telephone Encounter (Signed)
Done erx 

## 2012-12-23 ENCOUNTER — Telehealth: Payer: Self-pay

## 2012-12-23 MED ORDER — PIOGLITAZONE HCL 45 MG PO TABS: 45.0000 mg | ORAL_TABLET | Freq: Every day | ORAL | Status: DC

## 2012-12-23 MED ORDER — LEVOTHYROXINE SODIUM 175 MCG PO TABS: 175.0000 ug | ORAL_TABLET | Freq: Every day | ORAL | Status: DC

## 2012-12-23 MED ORDER — BUPROPION HCL ER (XL) 300 MG PO TB24: 300.0000 mg | ORAL_TABLET | Freq: Every day | ORAL | Status: DC

## 2012-12-23 MED ORDER — METFORMIN HCL 1000 MG PO TABS: 1000.0000 mg | ORAL_TABLET | Freq: Two times a day (BID) | ORAL | Status: DC

## 2012-12-23 NOTE — Telephone Encounter (Signed)
Refills done.

## 2012-12-28 ENCOUNTER — Other Ambulatory Visit (INDEPENDENT_AMBULATORY_CARE_PROVIDER_SITE_OTHER): Payer: Managed Care, Other (non HMO)

## 2012-12-28 DIAGNOSIS — E119 Type 2 diabetes mellitus without complications: Secondary | ICD-10-CM

## 2012-12-28 LAB — HEPATIC FUNCTION PANEL
Albumin: 3.9 g/dL (ref 3.5–5.2)
Alkaline Phosphatase: 90 U/L (ref 39–117)
Bilirubin, Direct: 0.1 mg/dL (ref 0.0–0.3)

## 2012-12-28 LAB — LIPID PANEL
HDL: 32.7 mg/dL — ABNORMAL LOW (ref >39.00)
LDL Cholesterol: 72 mg/dL (ref 0–99)
Total CHOL/HDL Ratio: 4
VLDL: 28.2 mg/dL (ref 0.0–40.0)

## 2012-12-28 LAB — BASIC METABOLIC PANEL
Calcium: 8.7 mg/dL (ref 8.4–10.5)
GFR: 85.17 mL/min (ref >60.00)
Sodium: 135 mEq/L (ref 135–145)

## 2012-12-31 ENCOUNTER — Encounter: Payer: Self-pay | Admitting: Internal Medicine

## 2012-12-31 ENCOUNTER — Ambulatory Visit (INDEPENDENT_AMBULATORY_CARE_PROVIDER_SITE_OTHER): Payer: Managed Care, Other (non HMO) | Admitting: Internal Medicine

## 2012-12-31 VITALS — BP 102/70 | HR 71 | Temp 97.1°F | Ht 72.0 in | Wt 270.2 lb

## 2012-12-31 DIAGNOSIS — E785 Hyperlipidemia, unspecified: Secondary | ICD-10-CM

## 2012-12-31 DIAGNOSIS — E119 Type 2 diabetes mellitus without complications: Secondary | ICD-10-CM

## 2012-12-31 DIAGNOSIS — IMO0001 Reserved for inherently not codable concepts without codable children: Secondary | ICD-10-CM

## 2012-12-31 DIAGNOSIS — I10 Essential (primary) hypertension: Secondary | ICD-10-CM

## 2012-12-31 DIAGNOSIS — Z0000 Encounter for general adult medical examination without abnormal findings: Secondary | ICD-10-CM

## 2012-12-31 MED ORDER — SITAGLIPTIN PHOSPHATE 100 MG PO TABS: 100.0000 mg | ORAL_TABLET | Freq: Every day | ORAL | Status: DC

## 2012-12-31 NOTE — Progress Notes (Signed)
Subjective:    Patient ID: Allen Myers, male    DOB: 1946/06/03, 67 y.o.   MRN: [id number]  HPI  Here to f/u; overall doing ok,  Pt denies chest pain, increased sob or doe, wheezing, orthopnea, PND, increased LE swelling, palpitations, dizziness or syncope.  Pt denies polydipsia, polyuria, or low sugar symptoms such as weakness or confusion improved with po intake.  Pt denies new neurological symptoms such as new headache, or facial or extremity weakness or numbness.   Pt states overall good compliance with meds, has been trying to follow lower cholesterol, diabetic diet, with wt overall stable,  but little exercise however. Has not been able to lose significant wt though he has signed up with [employer] Past Medical History  Diagnosis Date  . ALLERGIC RHINITIS 05/07/2007  . DEPRESSION 05/07/2007  . DIABETES MELLITUS, TYPE II 05/07/2007  . HYPERLIPIDEMIA 05/07/2007  . HYPOTHYROIDISM 05/07/2007  . SLEEP APNEA, OBSTRUCTIVE 05/07/2007  . COLONIC POLYPS, HX OF 12/07/2007  . DIVERTICULITIS, HX OF 05/07/2007  . DIVERTICULOSIS, COLON 12/07/2007  . ERECTILE DYSFUNCTION 05/07/2007  . NEPHROLITHIASIS, HX OF 12/07/2007  . HYPERTENSION 05/16/2008    pt denies   Past Surgical History  Procedure Laterality Date  . Colonoscopy    . Polypectomy      reports that he has quit smoking. He has never used smokeless tobacco. He reports that he drinks about 0.6 ounces of alcohol per week. He reports that he does not use illicit drugs. family history includes Cancer in his brother and cousin; Heart disease in his mother; and Lung cancer in his father.  There is no history of Colon cancer, and Rectal cancer, and Stomach cancer, . No Known Allergies Current Outpatient Prescriptions on File Prior to Visit  Medication Sig Dispense Refill  . acyclovir (ZOVIRAX) 200 MG capsule Take 4 capsules (800 mg total) by mouth 5 (five) times daily.  140 capsule  0  . aspirin 81 MG EC tablet Take 81 mg by mouth daily.        Marland Kitchen  buPROPion (WELLBUTRIN XL) 300 MG 24 hr tablet Take 1 tablet (300 mg total) by mouth daily.  90 tablet  2  . gabapentin (NEURONTIN) 100 MG capsule Take 1 capsule (100 mg total) by mouth 3 (three) times daily.  90 capsule  3  . glipiZIDE (GLUCOTROL XL) 10 MG 24 hr tablet Take 1 tablet (10 mg total) by mouth daily.  90 tablet  3  . levothyroxine (SYNTHROID, LEVOTHROID) 175 MCG tablet Take 1 tablet (175 mcg total) by mouth daily.  90 tablet  2  . lisinopril (PRINIVIL,ZESTRIL) 5 MG tablet Take 1 tablet (5 mg total) by mouth daily.  90 tablet  2  . lovastatin (MEVACOR) 20 MG tablet Take 1 tablet (20 mg total) by mouth at bedtime.  90 tablet  2  . metFORMIN (GLUCOPHAGE) 1000 MG tablet Take 1 tablet (1,000 mg total) by mouth 2 (two) times daily with a meal.  180 tablet  2  . pioglitazone (ACTOS) 45 MG tablet Take 1 tablet (45 mg total) by mouth daily.  90 tablet  2   No current facility-administered medications on file prior to visit.   Review of Systems  Constitutional: Negative for unexpected weight change, or unusual diaphoresis  HENT: Negative for tinnitus.   Eyes: Negative for photophobia and visual disturbance.  Respiratory: Negative for choking and stridor.   Gastrointestinal: Negative for vomiting and blood in stool.  Genitourinary: Negative for hematuria and decreased urine  volume.  Musculoskeletal: Negative for acute joint swelling Skin: Negative for color change and wound.  Neurological: Negative for tremors and numbness other than noted  Psychiatric/Behavioral: Negative for decreased concentration or  hyperactivity.       Objective:   Physical Exam BP 102/70  Pulse 71  Temp(Src) 97.1 F (36.2 C) (Oral)  Ht 6' (1.829 m)  Wt 270 lb 4 oz (122.585 kg)  BMI 36.64 kg/m2  SpO2 93% VS noted,  Constitutional: Pt appears well-developed and well-nourished.  HENT: Head: NCAT.  Right Ear: External ear normal.  Left Ear: External ear normal.  Eyes: Conjunctivae and EOM are normal. Pupils  are equal, round, and reactive to light.  Neck: Normal range of motion. Neck supple.  Cardiovascular: Normal rate and regular rhythm.   Pulmonary/Chest: Effort normal and breath sounds normal.  Abd:  Soft, NT, non-distended, + BS Neurological: Pt is alert. Not confused  Skin: Skin is warm. No erythema.  Psychiatric: Pt behavior is normal. Thought content normal.     Assessment & Plan:

## 2012-12-31 NOTE — Assessment & Plan Note (Signed)
BP Readings from Last 3 Encounters:  12/31/12 102/70  11/12/12 122/76  09/29/12 118/82

## 2012-12-31 NOTE — Patient Instructions (Addendum)
OK to stop the glimeparde Please start the JANUVIA 100 mg per day Please check with your insurance to see if this is covered; another option would be Tradjenta at 5 mg per day Also, please realize if the [place] is covered, it can be combined into one prescription of the januvia/metformin at 50/1000 twice per day (called janumet), which would be one prescription (and save money) Thank you for enrolling in MyChart. Please follow the instructions below to securely access your online medical record. MyChart allows you to send messages to your doctor, view your test results, renew your prescriptions, schedule appointments, and more. To Log into My Chart online, please go by [employer] or [employer] to [email or web address], or download the MyChart App from the [employer] of [employer] .  Your Username is: [email or web address] (pass [id number]) Please send a practice Message on Mychart later today. Please return in 6 months, or sooner if needed, with Lab testing done 3-5 days before

## 2012-12-31 NOTE — Assessment & Plan Note (Signed)
stable overall by history and exam, recent data reviewed with pt, and pt to continue medical treatment as before,  to f/u any worsening symptoms or concerns Lab Results  Component Value Date   LDLCALC 72 12/28/2012

## 2012-12-31 NOTE — Assessment & Plan Note (Signed)
Lab Results  Component Value Date   HGBA1C 8.3* 12/28/2012   Mild uncontrolled, to stop the glimeparide, start [place] if ok with insurance (consider combination of janumet if covered), cont all other meds, declines DM education, cont best efforts at diet and wt loss, to f/u any worsening symptoms or concerns

## 2013-03-10 ENCOUNTER — Telehealth: Payer: Self-pay | Admitting: Internal Medicine

## 2013-03-10 DIAGNOSIS — H9209 Otalgia, unspecified ear: Secondary | ICD-10-CM

## 2013-03-10 NOTE — Telephone Encounter (Signed)
Error below   Referral done for pt to ENT

## 2013-03-10 NOTE — Telephone Encounter (Signed)
Pt would like to be referred to an ENT.  His ear aches down to the jaw constantly.

## 2013-03-10 NOTE — Telephone Encounter (Signed)
Done hardcopy to robin  

## 2013-03-11 ENCOUNTER — Ambulatory Visit: Payer: Managed Care, Other (non HMO) | Admitting: Internal Medicine

## 2013-03-29 ENCOUNTER — Encounter: Payer: Self-pay | Admitting: Internal Medicine

## 2013-03-29 NOTE — Telephone Encounter (Signed)
Ok to stop the glipizide as it did not seem to be helping

## 2013-04-02 ENCOUNTER — Other Ambulatory Visit: Payer: Self-pay

## 2013-05-19 ENCOUNTER — Other Ambulatory Visit: Payer: Self-pay

## 2013-05-21 ENCOUNTER — Encounter: Payer: Self-pay | Admitting: Internal Medicine

## 2013-06-11 ENCOUNTER — Other Ambulatory Visit (INDEPENDENT_AMBULATORY_CARE_PROVIDER_SITE_OTHER): Payer: Managed Care, Other (non HMO)

## 2013-06-11 DIAGNOSIS — IMO0001 Reserved for inherently not codable concepts without codable children: Secondary | ICD-10-CM

## 2013-06-11 DIAGNOSIS — Z0000 Encounter for general adult medical examination without abnormal findings: Secondary | ICD-10-CM

## 2013-06-11 LAB — HEPATIC FUNCTION PANEL
Albumin: 4 g/dL (ref 3.5–5.2)
Total Bilirubin: 0.7 mg/dL (ref 0.3–1.2)

## 2013-06-11 LAB — CBC WITH DIFFERENTIAL/PLATELET
Basophils Relative: 0.4 % (ref 0.0–3.0)
Eosinophils Relative: 2.3 % (ref 0.0–5.0)
HCT: 44.5 % (ref 39.0–52.0)
Hemoglobin: 15 g/dL (ref 13.0–17.0)
Lymphs Abs: 1.8 10*3/uL (ref 0.7–4.0)
Monocytes Relative: 9.2 % (ref 3.0–12.0)
Neutro Abs: 5.5 10*3/uL (ref 1.4–7.7)
Platelets: 186 10*3/uL (ref 150.0–400.0)
RBC: 5.18 Mil/uL (ref 4.22–5.81)
WBC: 8.2 10*3/uL (ref 4.5–10.5)

## 2013-06-11 LAB — TSH: TSH: 0.58 u[IU]/mL (ref 0.35–5.50)

## 2013-06-11 LAB — URINALYSIS, ROUTINE W REFLEX MICROSCOPIC
Ketones, ur: NEGATIVE
Leukocytes, UA: NEGATIVE
RBC / HPF: NONE SEEN (ref 0–?)
Specific Gravity, Urine: 1.025 (ref 1.000–1.030)
Total Protein, Urine: NEGATIVE
pH: 6 (ref 5.0–8.0)

## 2013-06-11 LAB — LIPID PANEL
HDL: 35.2 mg/dL — ABNORMAL LOW (ref >39.00)
Total CHOL/HDL Ratio: 4
Triglycerides: 252 mg/dL — ABNORMAL HIGH (ref 0.0–149.0)
VLDL: 50.4 mg/dL — ABNORMAL HIGH (ref 0.0–40.0)

## 2013-06-11 LAB — MICROALBUMIN / CREATININE URINE RATIO: Microalb Creat Ratio: 0.7 mg/g (ref 0.0–30.0)

## 2013-06-11 LAB — BASIC METABOLIC PANEL
Calcium: 9.2 mg/dL (ref 8.4–10.5)
GFR: 89.43 mL/min (ref >60.00)
Glucose, Bld: 310 mg/dL — ABNORMAL HIGH (ref 70–99)
Sodium: 134 mEq/L — ABNORMAL LOW (ref 135–145)

## 2013-06-11 LAB — PSA: PSA: 1.45 ng/mL (ref 0.10–4.00)

## 2013-06-15 ENCOUNTER — Encounter: Payer: Self-pay | Admitting: Internal Medicine

## 2013-06-15 ENCOUNTER — Ambulatory Visit (INDEPENDENT_AMBULATORY_CARE_PROVIDER_SITE_OTHER): Payer: Managed Care, Other (non HMO) | Admitting: Internal Medicine

## 2013-06-15 VITALS — BP 102/68 | HR 79 | Temp 98.4°F | Ht 73.0 in | Wt 267.2 lb

## 2013-06-15 DIAGNOSIS — E119 Type 2 diabetes mellitus without complications: Secondary | ICD-10-CM

## 2013-06-15 DIAGNOSIS — Z23 Encounter for immunization: Secondary | ICD-10-CM

## 2013-06-15 DIAGNOSIS — G4733 Obstructive sleep apnea (adult) (pediatric): Secondary | ICD-10-CM

## 2013-06-15 DIAGNOSIS — Z0000 Encounter for general adult medical examination without abnormal findings: Secondary | ICD-10-CM

## 2013-06-15 MED ORDER — LINAGLIPTIN 5 MG PO TABS: 5.0000 mg | ORAL_TABLET | Freq: Every day | ORAL | Status: DC

## 2013-06-15 NOTE — Patient Instructions (Addendum)
You had the flu shot today OK to stop the [place] Please try the Tradjenta at 5 mg per day Please continue all other medications as before, and refills have been done if requested. Please have the pharmacy call with any other refills you may need. You will be contacted regarding the referral for: Dr Shelle Iron for sleep apnea and fatigue Please continue your efforts at being more active, low cholesterol diet, and weight control. You are otherwise up to date with prevention measures today.  Please remember to sign up for My Chart if you have not done so, as this will be important to you in the future with finding out test results, communicating by private email, and scheduling acute appointments online when needed.  Please return in 6 months, or sooner if needed, with Lab testing done 3-5 days before

## 2013-06-15 NOTE — Addendum Note (Signed)
Addended by: Corwin Levins on: 06/15/2013 10:13 AM   Modules accepted: Orders

## 2013-06-15 NOTE — Assessment & Plan Note (Signed)
Uncontrolled, to d/c [place] as not working, ok to try tradjenta, if not working will [employer] need insluin

## 2013-06-15 NOTE — Progress Notes (Signed)
Subjective:    Patient ID: Allen Myers, male    DOB: 1946-07-12, 67 y.o.   MRN: [id number]  HPI Here for wellness and f/u;  Overall doing ok;  Pt denies CP, worsening SOB, DOE, wheezing, orthopnea, PND, worsening LE edema, palpitations, dizziness or syncope.  Pt denies neurological change such as new headache, facial or extremity weakness.  Pt denies polydipsia, polyuria, or low sugar symptoms. Pt states overall good compliance with treatment and medications, good tolerability, and has been trying to follow lower cholesterol diet.  Pt denies worsening depressive symptoms, suicidal ideation or panic. No fever, night sweats, wt loss, loss of appetite, or other constitutional symptoms.  Pt states good ability with ADL's, has low fall risk, home safety reviewed and adequate, no other significant changes in hearing or vision, and only occasionally active with exercise, plans to re-start the GYM, snores at night, has sleep apnea by previous testing, tends to get poor sleep, wakes up about 10 times per night, occas night leg cramps, better to get up to walk. Does not want med for sleep at night. Does c/o ongoing fatigue, but has signficant daytime hypersomnolence, overall increased wt since last sleep test 2005 with mod OSA  Past Medical History  Diagnosis Date  . ALLERGIC RHINITIS 05/07/2007  . DEPRESSION 05/07/2007  . DIABETES MELLITUS, TYPE II 05/07/2007  . HYPERLIPIDEMIA 05/07/2007  . HYPOTHYROIDISM 05/07/2007  . SLEEP APNEA, OBSTRUCTIVE 05/07/2007  . COLONIC POLYPS, HX OF 12/07/2007  . DIVERTICULITIS, HX OF 05/07/2007  . DIVERTICULOSIS, COLON 12/07/2007  . ERECTILE DYSFUNCTION 05/07/2007  . NEPHROLITHIASIS, HX OF 12/07/2007  . HYPERTENSION 05/16/2008    pt denies   Past Surgical History  Procedure Laterality Date  . Colonoscopy    . Polypectomy      reports that he has quit smoking. He has never used smokeless tobacco. He reports that he drinks about 0.6 ounces of alcohol per week. He reports that he  does not use illicit drugs. family history includes Cancer in his brother and cousin; Heart disease in his mother; Lung cancer in his father. There is no history of Colon cancer, Rectal cancer, or Stomach cancer. No Known Allergies Current Outpatient Prescriptions on File Prior to Visit  Medication Sig Dispense Refill  . acyclovir (ZOVIRAX) 200 MG capsule Take 4 capsules (800 mg total) by mouth 5 (five) times daily.  140 capsule  0  . aspirin 81 MG EC tablet Take 81 mg by mouth daily.        Marland Kitchen buPROPion (WELLBUTRIN XL) 300 MG 24 hr tablet Take 1 tablet (300 mg total) by mouth daily.  90 tablet  2  . gabapentin (NEURONTIN) 100 MG capsule Take 1 capsule (100 mg total) by mouth 3 (three) times daily.  90 capsule  3  . levothyroxine (SYNTHROID, LEVOTHROID) 175 MCG tablet Take 1 tablet (175 mcg total) by mouth daily.  90 tablet  2  . lisinopril (PRINIVIL,ZESTRIL) 5 MG tablet Take 1 tablet (5 mg total) by mouth daily.  90 tablet  2  . lovastatin (MEVACOR) 20 MG tablet Take 1 tablet (20 mg total) by mouth at bedtime.  90 tablet  2  . metFORMIN (GLUCOPHAGE) 1000 MG tablet Take 1 tablet (1,000 mg total) by mouth 2 (two) times daily with a meal.  180 tablet  2  . pioglitazone (ACTOS) 45 MG tablet Take 1 tablet (45 mg total) by mouth daily.  90 tablet  2   No current facility-administered medications on file prior to  visit.   Review of Systems Constitutional: Negative for diaphoresis, activity change, appetite change or unexpected weight change.  HENT: Negative for hearing loss, ear pain, facial swelling, mouth sores and neck stiffness.   Eyes: Negative for pain, redness and visual disturbance.  Respiratory: Negative for shortness of breath and wheezing.   Cardiovascular: Negative for chest pain and palpitations.  Gastrointestinal: Negative for diarrhea, blood in stool, abdominal distention or other pain Genitourinary: Negative for hematuria, flank pain or change in urine volume.  Musculoskeletal:  Negative for myalgias and joint swelling.  Skin: Negative for color change and wound.  Neurological: Negative for syncope and numbness. other than noted Hematological: Negative for adenopathy.  Psychiatric/Behavioral: Negative for hallucinations, self-injury, decreased concentration and agitation.      Objective:   Physical Exam BP 102/68  Pulse 79  Temp(Src) 98.4 F (36.9 C) (Oral)  Ht 6\' 1"  (1.854 m)  Wt 267 lb 4 oz (121.224 kg)  BMI 35.27 kg/m2  SpO2 94% VS noted,  Constitutional: Pt is oriented to person, place, and time. Appears well-developed and well-nourished.  Head: Normocephalic and atraumatic.  Right Ear: External ear normal.  Left Ear: External ear normal.  Nose: Nose normal.  Mouth/Throat: Oropharynx is clear and moist.  Eyes: Conjunctivae and EOM are normal. Pupils are equal, round, and reactive to light.  Neck: Normal range of motion. Neck supple. No JVD present. No tracheal deviation present.  Cardiovascular: Normal rate, regular rhythm, normal heart sounds and intact distal pulses.   Pulmonary/Chest: Effort normal and breath sounds normal.  Abdominal: Soft. Bowel sounds are normal. There is no tenderness. No HSM  Musculoskeletal: Normal range of motion. Exhibits no edema.  Lymphadenopathy:  Has no cervical adenopathy.  Neurological: Pt is alert and oriented to person, place, and time. Pt has normal reflexes. No cranial nerve deficit.  Skin: Skin is warm and dry. No rash noted.  Psychiatric:  Has  normal mood and affect. Behavior is normal.     Assessment & Plan:

## 2013-06-15 NOTE — Assessment & Plan Note (Signed)

## 2013-06-15 NOTE — Assessment & Plan Note (Signed)
To refer to Dr Shelle Iron for tx, he now accepts

## 2013-06-16 ENCOUNTER — Encounter: Payer: Self-pay | Admitting: Internal Medicine

## 2013-06-21 ENCOUNTER — Ambulatory Visit: Payer: Managed Care, Other (non HMO) | Admitting: Internal Medicine

## 2013-06-25 ENCOUNTER — Ambulatory Visit (INDEPENDENT_AMBULATORY_CARE_PROVIDER_SITE_OTHER): Payer: Managed Care, Other (non HMO) | Admitting: Internal Medicine

## 2013-06-25 VITALS — BP 102/60 | HR 97 | Temp 97.2°F | Ht 73.0 in | Wt 266.0 lb

## 2013-06-25 DIAGNOSIS — J069 Acute upper respiratory infection, unspecified: Secondary | ICD-10-CM

## 2013-06-25 DIAGNOSIS — E119 Type 2 diabetes mellitus without complications: Secondary | ICD-10-CM

## 2013-06-25 DIAGNOSIS — I10 Essential (primary) hypertension: Secondary | ICD-10-CM

## 2013-06-25 MED ORDER — HYDROCODONE-HOMATROPINE 5-1.5 MG/5ML PO SYRP: 5.0000 mL | ORAL_SOLUTION | Freq: Four times a day (QID) | ORAL | Status: DC | PRN

## 2013-06-25 MED ORDER — AZITHROMYCIN 250 MG PO TABS: ORAL_TABLET | ORAL | Status: DC

## 2013-06-25 NOTE — Patient Instructions (Signed)
Please take all new medication as prescribed Please continue all other medications as before, and refills have been done if requested. Please have the pharmacy call with any other refills you may need.  Please remember to sign up for My Chart if you have not done so, as this will be important to you in the future with finding out test results, communicating by private email, and scheduling acute appointments online when needed.   

## 2013-06-26 ENCOUNTER — Encounter: Payer: Self-pay | Admitting: Internal Medicine

## 2013-06-26 NOTE — Progress Notes (Signed)
Subjective:    Patient ID: Allen Myers, male    DOB: [birth year], 67 y.o.   MRN: [id number]  HPI   Here with 2-3 days acute onset fever, facial pain, pressure, headache, general weakness and malaise, and greenish d/c, with mild ST and cough, but pt denies chest pain, wheezing, increased sob or doe, orthopnea, PND, increased LE swelling, palpitations, dizziness or syncope.  Pt denies polydipsia, polyuria, or low sugar symptoms such as weakness or confusion improved with po intake.  Pt denies new neurological symptoms such as new headache, or facial or extremity weakness or numbness.   Past Medical History  Diagnosis Date  . ALLERGIC RHINITIS 05/07/2007  . DEPRESSION 05/07/2007  . DIABETES MELLITUS, TYPE II 05/07/2007  . HYPERLIPIDEMIA 05/07/2007  . HYPOTHYROIDISM 05/07/2007  . SLEEP APNEA, OBSTRUCTIVE 05/07/2007  . COLONIC POLYPS, HX OF 12/07/2007  . DIVERTICULITIS, HX OF 05/07/2007  . DIVERTICULOSIS, COLON 12/07/2007  . ERECTILE DYSFUNCTION 05/07/2007  . NEPHROLITHIASIS, HX OF 12/07/2007  . HYPERTENSION 05/16/2008    pt denies   Past Surgical History  Procedure Laterality Date  . Colonoscopy    . Polypectomy      reports that he has quit smoking. He has never used smokeless tobacco. He reports that he drinks about 0.6 ounces of alcohol per week. He reports that he does not use illicit drugs. family history includes Cancer in his brother and cousin; Heart disease in his mother; Lung cancer in his father. There is no history of Colon cancer, Rectal cancer, or Stomach cancer. No Known Allergies Current Outpatient Prescriptions on File Prior to Visit  Medication Sig Dispense Refill  . acyclovir (ZOVIRAX) 200 MG capsule Take 4 capsules (800 mg total) by mouth 5 (five) times daily.  140 capsule  0  . aspirin 81 MG EC tablet Take 81 mg by mouth daily.        Marland Kitchen buPROPion (WELLBUTRIN XL) 300 MG 24 hr tablet Take 1 tablet (300 mg total) by mouth daily.  90 tablet  2  . gabapentin (NEURONTIN) 100 MG  capsule Take 1 capsule (100 mg total) by mouth 3 (three) times daily.  90 capsule  3  . levothyroxine (SYNTHROID, LEVOTHROID) 175 MCG tablet Take 1 tablet (175 mcg total) by mouth daily.  90 tablet  2  . linagliptin (TRADJENTA) 5 MG TABS tablet Take 1 tablet (5 mg total) by mouth daily.  90 tablet  3  . lisinopril (PRINIVIL,ZESTRIL) 5 MG tablet Take 1 tablet (5 mg total) by mouth daily.  90 tablet  2  . lovastatin (MEVACOR) 20 MG tablet Take 1 tablet (20 mg total) by mouth at bedtime.  90 tablet  2  . metFORMIN (GLUCOPHAGE) 1000 MG tablet Take 1 tablet (1,000 mg total) by mouth 2 (two) times daily with a meal.  180 tablet  2  . pioglitazone (ACTOS) 45 MG tablet Take 1 tablet (45 mg total) by mouth daily.  90 tablet  2   No current facility-administered medications on file prior to visit.   Review of Systems  Constitutional: Negative for unexpected weight change, or unusual diaphoresis  HENT: Negative for tinnitus.   Eyes: Negative for photophobia and visual disturbance.  Respiratory: Negative for choking and stridor.   Gastrointestinal: Negative for vomiting and blood in stool.  Genitourinary: Negative for hematuria and decreased urine volume.  Musculoskeletal: Negative for acute joint swelling Skin: Negative for color change and wound.  Neurological: Negative for tremors and numbness other than noted  Psychiatric/Behavioral: Negative  for decreased concentration or  hyperactivity.       Objective:   Physical Exam BP 102/60  Pulse 97  Temp(Src) 97.2 F (36.2 C) (Oral)  Ht 6\' 1"  (1.854 m)  Wt 266 lb (120.657 kg)  BMI 35.1 kg/m2  SpO2 95% VS noted, mild ill Constitutional: Pt appears well-developed and well-nourished.  HENT: Head: NCAT.  Right Ear: External ear normal.  Left Ear: External ear normal.  Bilat tm's with mild erythema.  Max sinus areas mild tender.  Pharynx with mild erythema, no exudate Eyes: Conjunctivae and EOM are normal. Pupils are equal, round, and reactive to  light.  Neck: Normal range of motion. Neck supple.  Cardiovascular: Normal rate and regular rhythm.   Pulmonary/Chest: Effort normal and breath sounds normal.  Neurological: Pt is alert. Not confused  Skin: Skin is warm. No erythema.  Psychiatric: Pt behavior is normal. Thought content normal.     Assessment & Plan:

## 2013-06-26 NOTE — Assessment & Plan Note (Signed)
stable overall by history and exam, recent data reviewed with pt, and pt to continue medical treatment as before,  to f/u any worsening symptoms or concerns BP Readings from Last 3 Encounters:  06/25/13 102/60  06/15/13 102/68  12/31/12 102/70

## 2013-06-26 NOTE — Assessment & Plan Note (Signed)
Mild to mod, for antibx course,  to f/u any worsening symptoms or concerns 

## 2013-06-26 NOTE — Assessment & Plan Note (Signed)
Recent uncontrolled, imrpoved symptomatically,  recent data reviewed with pt, and pt to continue medical treatment as before,  to f/u any worsening symptoms or concerns Lab Results  Component Value Date   HGBA1C 10.3* 06/11/2013

## 2013-07-16 ENCOUNTER — Encounter: Payer: Self-pay | Admitting: Internal Medicine

## 2013-07-22 ENCOUNTER — Ambulatory Visit (INDEPENDENT_AMBULATORY_CARE_PROVIDER_SITE_OTHER): Payer: Managed Care, Other (non HMO) | Admitting: Pulmonary Disease

## 2013-07-22 ENCOUNTER — Encounter: Payer: Self-pay | Admitting: Pulmonary Disease

## 2013-07-22 VITALS — BP 122/70 | HR 73 | Temp 97.8°F | Ht 74.0 in | Wt 265.6 lb

## 2013-07-22 DIAGNOSIS — G4733 Obstructive sleep apnea (adult) (pediatric): Secondary | ICD-10-CM | POA: Insufficient documentation

## 2013-07-22 NOTE — Patient Instructions (Signed)
Work on weight reduction Think about trying cpap, and let me know if you wish to consider.

## 2013-07-22 NOTE — Progress Notes (Signed)
Subjective:    Patient ID: Allen Myers, male    DOB: 1946-02-27, 67 y.o.   MRN: [id number]  HPI The patient is a 67 year old male who I have been asked to see for obstructive sleep apnea.  The patient had a sleep study in 2005, where he was found to have moderate OSA.  He was never treated for this, and he is unsure what discussions took place at that time.  She currently is still having loud snoring, as well as an abnormal breathing pattern during sleep.  His wife has moved to a different room to sleep.  The patient notes frequent awakenings at night, but feels adequately rested in the mornings upon arising.  However, he can definitely feel inappropriate sleepiness during the day with inactivity, especially after lunch.  He can easily fall asleep during this time.  The patient also has sleepiness in the evenings watching television, but rarely has sleepiness during driving with longer distances.  He states his weight is up about 10 pounds over the last 2 years, and his Epworth score today is 17.   Sleep Questionnaire What time do you typically go to bed?( Between what hours) 10p-12a 10p-12a at 1041 on 07/22/13 by Maisie Fus, CMA How long does it take you to fall asleep? within 5 mins most nights within 5 mins most nights at 1041 on 07/22/13 by Maisie Fus, CMA How many times during the night do you wake up? 6 6 at 1041 on 07/22/13 by Maisie Fus, CMA What time do you get out of bed to start your day? 0600 0600 at 1041 on 07/22/13 by Maisie Fus, CMA Do you drive or operate heavy machinery in your occupation? No No at 1041 on 07/22/13 by Maisie Fus, CMA How much has your weight changed (up or down) over the past two years? (In pounds) 10 lb (4.536 kg)10 lb (4.536 kg) increased at 1041 on 07/22/13 by Maisie Fus, CMA Have you ever had a sleep study before? Yes Yes at 1041 on 07/22/13 by Maisie Fus, CMA If yes, [facility] on  07/22/13 by Maisie Fus, CMA If yes, date of study? 2005 2005 at 1041 on 07/22/13 by Maisie Fus, CMA Do you currently use CPAP? No No at 1041 on 07/22/13 by Maisie Fus, CMA Do you wear oxygen at any time? No No at 1041 on 07/22/13 by Maisie Fus, CMA   Review of Systems  Constitutional: Negative for fever and unexpected weight change.  HENT: Negative for congestion, dental problem, ear pain, nosebleeds, postnasal drip, rhinorrhea, sinus pressure, sneezing, sore throat and trouble swallowing.   Eyes: Negative for redness and itching.  Respiratory: Negative for cough, chest tightness, shortness of breath and wheezing.   Cardiovascular: Negative for palpitations and leg swelling.  Gastrointestinal: Negative for nausea and vomiting.  Genitourinary: Negative for dysuria.  Musculoskeletal: Negative for joint swelling.  Skin: Negative for rash.  Neurological: Negative for headaches.  Hematological: Does not bruise/bleed easily.  Psychiatric/Behavioral: Negative for dysphoric mood. The patient is not nervous/anxious.        Objective:   Physical Exam Constitutional:  Obese male, no acute distress  HENT:  Nares patent without discharge  Oropharynx without exudate, palate and uvula are thick and elongated.   Eyes:  Perrla, eomi, no scleral icterus  Neck:  No JVD, no TMG  Cardiovascular:  Normal rate, regular rhythm, no rubs or gallops.  No  murmurs        Intact distal pulses  Pulmonary :  Normal breath sounds, no stridor or respiratory distress   No rales, rhonchi, or wheezing  Abdominal:  Soft, nondistended, bowel sounds present.  No tenderness noted.   Musculoskeletal:  mild lower extremity edema noted.  Lymph Nodes:  No cervical lymphadenopathy noted  Skin:  No cyanosis noted  Neurologic:  Alert, appropriate, moves all 4 extremities without obvious deficit.         Assessment & Plan:

## 2013-07-22 NOTE — Assessment & Plan Note (Signed)
The patient has a history of moderate obstructive sleep apnea, and has not lost weight since that time.  He is definitely symptomatic during the day and in the evening, and I have reviewed the impact of sleep disordered breathing on quality of life and cardiovascular health.  I have recommended a trial of CPAP while working on weight loss if he wishes to treat this aggressively, but he would like to do some more research on CPAP and talk to a few of his friends who use the device.  I have asked him to work on weight loss, and consider a trial of CPAP.

## 2013-09-23 ENCOUNTER — Other Ambulatory Visit: Payer: Self-pay | Admitting: Internal Medicine

## 2013-10-01 ENCOUNTER — Telehealth: Payer: Self-pay

## 2013-10-01 MED ORDER — LINAGLIPTIN 5 MG PO TABS: 5.0000 mg | ORAL_TABLET | Freq: Every day | ORAL | Status: DC

## 2013-10-01 NOTE — Telephone Encounter (Signed)
The patient called and is hoping to discuss his medications.  He is confused about which meds to take  Callback - [phone]  Thanks!

## 2013-10-01 NOTE — Telephone Encounter (Signed)
Refill Tradjenta as requested to [facility] and called the patient left a message of MD instructions.

## 2013-10-01 NOTE — Telephone Encounter (Signed)
Is supposed to be on tradjenta 5 mg and actos as well; needs to take both, robin to handle refills

## 2013-10-01 NOTE — Telephone Encounter (Signed)
Called the patient and he would like to know if he is to be taking Tradjenta as did have email to PCP regarding this medication (07/16/13).  Patient has 2 request, if he is to take it is this medication replacing something else and needs #[facility] sent [facility] [address]. As is out??  Advise please.

## 2013-10-25 ENCOUNTER — Telehealth: Payer: Self-pay | Admitting: *Deleted

## 2013-10-25 NOTE — Telephone Encounter (Signed)
Patient phoned requesting "cough syrup" script be called in.  According to [facility], he was previously taking hycodan.  Please advise.  Last OV with PCP 06/25/13   CB# [phone]

## 2013-10-25 NOTE — Telephone Encounter (Signed)
Sorry, due to recent change in [place] law and [place] Regulations regarding controlled substances, I would have to ask for OV to make sure this is appropriate

## 2013-10-26 NOTE — Telephone Encounter (Signed)
Notified patient of MD response & he stated he was already scheduled to come in tomorrow.

## 2013-10-27 ENCOUNTER — Encounter: Payer: Self-pay | Admitting: Internal Medicine

## 2013-10-27 ENCOUNTER — Ambulatory Visit (INDEPENDENT_AMBULATORY_CARE_PROVIDER_SITE_OTHER): Payer: Managed Care, Other (non HMO) | Admitting: Internal Medicine

## 2013-10-27 VITALS — BP 110/68 | HR 92 | Temp 98.1°F | Ht 73.0 in | Wt 253.0 lb

## 2013-10-27 DIAGNOSIS — J069 Acute upper respiratory infection, unspecified: Secondary | ICD-10-CM

## 2013-10-27 DIAGNOSIS — I10 Essential (primary) hypertension: Secondary | ICD-10-CM

## 2013-10-27 DIAGNOSIS — E119 Type 2 diabetes mellitus without complications: Secondary | ICD-10-CM

## 2013-10-27 MED ORDER — OSELTAMIVIR PHOSPHATE 75 MG PO CAPS: 75.0000 mg | ORAL_CAPSULE | Freq: Two times a day (BID) | ORAL | Status: DC

## 2013-10-27 MED ORDER — HYDROCODONE-HOMATROPINE 5-1.5 MG/5ML PO SYRP: 5.0000 mL | ORAL_SOLUTION | Freq: Four times a day (QID) | ORAL | Status: DC | PRN

## 2013-10-27 NOTE — Assessment & Plan Note (Signed)
stable overall by history and exam, recent data reviewed with pt, and pt to continue medical treatment as before,  to f/u any worsening symptoms or concerns BP Readings from Last 3 Encounters:  10/27/13 110/68  07/22/13 122/70  06/25/13 102/60

## 2013-10-27 NOTE — Progress Notes (Signed)
Subjective:    Patient ID: Allen Myers, male    DOB: 1946/08/04, 68 y.o.   MRN: [id number]  HPI   Here with 2-3 days acute onset fever,, ST, pressure, myalgias, headache, general weakness and malaise,  With mild prod cough, and somewhat loose stool, but pt denies chest pain, wheezing, increased sob or doe, orthopnea, PND, increased LE swelling, palpitations, dizziness or syncope.   Pt denies polydipsia, polyuria, most recent cbg this am 350.  Pt denies new neurological symptoms such as new headache, or facial or extremity weakness or numbness Past Medical History  Diagnosis Date  . ALLERGIC RHINITIS 05/07/2007  . DEPRESSION 05/07/2007  . DIABETES MELLITUS, TYPE II 05/07/2007  . HYPERLIPIDEMIA 05/07/2007  . HYPOTHYROIDISM 05/07/2007  . SLEEP APNEA, OBSTRUCTIVE 05/07/2007  . COLONIC POLYPS, HX OF 12/07/2007  . DIVERTICULITIS, HX OF 05/07/2007  . DIVERTICULOSIS, COLON 12/07/2007  . ERECTILE DYSFUNCTION 05/07/2007  . NEPHROLITHIASIS, HX OF 12/07/2007  . HYPERTENSION 05/16/2008    pt denies   Past Surgical History  Procedure Laterality Date  . Colonoscopy    . Polypectomy      reports that he quit smoking about 30 years ago. His smoking use included Cigarettes. He has a 30 pack-year smoking history. He has never used smokeless tobacco. He reports that he drinks about 0.6 ounces of alcohol per week. He reports that he does not use illicit drugs. family history includes Cancer in his brother and cousin; Heart disease in his mother; Lung cancer in his father. There is no history of Colon cancer, Rectal cancer, or Stomach cancer. No Known Allergies Current Outpatient Prescriptions on File Prior to Visit  Medication Sig Dispense Refill  . acyclovir (ZOVIRAX) 200 MG capsule Take 800 mg by mouth 5 (five) times daily as needed.      Marland Kitchen aspirin 81 MG EC tablet Take 81 mg by mouth daily.        Marland Kitchen buPROPion (WELLBUTRIN XL) 300 MG 24 hr tablet Take 1 tablet (300 mg total) by mouth daily.  90 tablet  2  .  levothyroxine (SYNTHROID, LEVOTHROID) 175 MCG tablet TAKE 1 TABLET BY MOUTH EVERY DAY  90 tablet  2  . linagliptin (TRADJENTA) 5 MG TABS tablet Take 1 tablet (5 mg total) by mouth daily.  30 tablet  1  . lisinopril (PRINIVIL,ZESTRIL) 5 MG tablet TAKE 1 TABLET BY MOUTH DAILY  90 tablet  2  . lovastatin (MEVACOR) 20 MG tablet TAKE 1 TABLET BY MOUTH AT BEDTIME  90 tablet  2  . metFORMIN (GLUCOPHAGE) 1000 MG tablet TAKE 1 TABLET BY MOUTH TWICE DAILY WITH MEALS  180 tablet  2  . pioglitazone (ACTOS) 45 MG tablet TAKE 1 TABLET BY MOUTH EVERY DAY  90 tablet  2   No current facility-administered medications on file prior to visit.   Review of Systems  Constitutional: Negative for unexpected weight change, or unusual diaphoresis  HENT: Negative for tinnitus.   Eyes: Negative for photophobia and visual disturbance.  Respiratory: Negative for choking and stridor.   Gastrointestinal: Negative for vomiting and blood in stool.  Genitourinary: Negative for hematuria and decreased urine volume.  Musculoskeletal: Negative for acute joint swelling Skin: Negative for color change and wound.  Neurological: Negative for tremors and numbness other than noted  Psychiatric/Behavioral: Negative for decreased concentration or  hyperactivity.       Objective:   Physical Exam BP 110/68  Pulse 92  Temp(Src) 98.1 F (36.7 C) (Oral)  Ht 6\' 1"  (  1.854 m)  Wt 253 lb (114.76 kg)  BMI 33.39 kg/m2  SpO2 92% VS noted, mild ill Constitutional: Pt appears well-developed and well-nourished.  HENT: Head: NCAT.  Right Ear: External ear normal.  Left Ear: External ear normal.  Bilat tm's with mild erythema.  Max sinus areas non tender.  Pharynx with mild erythema, no exudate Eyes: Conjunctivae and EOM are normal. Pupils are equal, round, and reactive to light.  Neck: Normal range of motion. Neck supple.  Cardiovascular: Normal rate and regular rhythm.   Pulmonary/Chest: Effort normal and breath sounds without rales,  wheezing.  Neurological: Pt is alert. Not confused  Skin: Skin is warm. No erythema.  Psychiatric: Pt behavior is normal. Thought content normal.     Assessment & Plan:

## 2013-10-27 NOTE — Progress Notes (Signed)
Pre-visit discussion using our clinic review tool. No additional management support is needed unless otherwise documented below in the visit note.  

## 2013-10-27 NOTE — Patient Instructions (Signed)
Please take all new medication as prescribed - the tamiflu, and the cough medicine  You can also take Mucinex (or it's generic off brand) for congestion, and tylenol as needed for pain.  Please continue all other medications as before, and refills have been done if requested.

## 2013-10-27 NOTE — Assessment & Plan Note (Signed)
Flu like symptoms - for tamiflu asd, cough med prn,  to f/u any worsening symptoms or concerns j

## 2013-10-27 NOTE — Assessment & Plan Note (Signed)
Mild increased cbg with acute illness, cont monitor cbg's,, f/u with persistent cbg

## 2013-10-28 ENCOUNTER — Telehealth: Payer: Self-pay

## 2013-10-28 NOTE — Telephone Encounter (Signed)
Relevant patient education assigned to patient using Emmi. ° °

## 2013-11-12 ENCOUNTER — Telehealth: Payer: Self-pay | Admitting: Internal Medicine

## 2013-11-12 MED ORDER — HYDROCODONE-HOMATROPINE 5-1.5 MG/5ML PO SYRP: 5.0000 mL | ORAL_SOLUTION | Freq: Four times a day (QID) | ORAL | Status: DC | PRN

## 2013-11-12 MED ORDER — AZITHROMYCIN 250 MG PO TABS: ORAL_TABLET | ORAL | Status: DC

## 2013-11-12 NOTE — Telephone Encounter (Signed)
Pt called request something for cough. Pt was here for ov 1.14.15 and he is better( can't stop coughing). Please advise. Pt stated he called 2 days ago and never heard anything from our office.

## 2013-11-12 NOTE — Telephone Encounter (Signed)
This is the first message I have seen,  Done hardcopy to robin

## 2013-11-12 NOTE — Telephone Encounter (Signed)
Patient informed to pickup at the front desk.  The patient stated he has been coughing for 3 weeks, has had tamiflu and would like to know if he could have an antibiotic ([facility]).

## 2013-11-12 NOTE — Telephone Encounter (Signed)
Patient informed antibiotic sent in as requested.

## 2013-11-12 NOTE — Telephone Encounter (Signed)
Done per emr 

## 2014-01-07 ENCOUNTER — Other Ambulatory Visit (INDEPENDENT_AMBULATORY_CARE_PROVIDER_SITE_OTHER): Payer: Managed Care, Other (non HMO)

## 2014-01-07 DIAGNOSIS — E119 Type 2 diabetes mellitus without complications: Secondary | ICD-10-CM

## 2014-01-07 LAB — HEPATIC FUNCTION PANEL
ALBUMIN: 3.8 g/dL (ref 3.5–5.2)
ALK PHOS: 84 U/L (ref 39–117)
ALT: 27 U/L (ref 0–53)
AST: 21 U/L (ref 0–37)
Bilirubin, Direct: 0.1 mg/dL (ref 0.0–0.3)
TOTAL PROTEIN: 7 g/dL (ref 6.0–8.3)
Total Bilirubin: 0.7 mg/dL (ref 0.3–1.2)

## 2014-01-07 LAB — LIPID PANEL
Cholesterol: 128 mg/dL (ref 0–200)
HDL: 35.4 mg/dL — ABNORMAL LOW (ref >39.00)
LDL Cholesterol: 58 mg/dL (ref 0–99)
Total CHOL/HDL Ratio: 4
Triglycerides: 172 mg/dL — ABNORMAL HIGH (ref 0.0–149.0)
VLDL: 34.4 mg/dL (ref 0.0–40.0)

## 2014-01-07 LAB — BASIC METABOLIC PANEL
BUN: 17 mg/dL (ref 6–23)
CALCIUM: 8.9 mg/dL (ref 8.4–10.5)
CHLORIDE: 103 meq/L (ref 96–112)
CO2: 28 meq/L (ref 19–32)
Creatinine, Ser: 0.9 mg/dL (ref 0.4–1.5)
GFR: 90.43 mL/min (ref >60.00)
GLUCOSE: 245 mg/dL — AB (ref 70–99)
Potassium: 4.7 mEq/L (ref 3.5–5.1)
SODIUM: 136 meq/L (ref 135–145)

## 2014-01-07 LAB — HEMOGLOBIN A1C: HEMOGLOBIN A1C: 9.9 % — AB (ref 4.6–6.5)

## 2014-01-11 ENCOUNTER — Encounter: Payer: Self-pay | Admitting: Internal Medicine

## 2014-01-11 ENCOUNTER — Ambulatory Visit (INDEPENDENT_AMBULATORY_CARE_PROVIDER_SITE_OTHER): Payer: Managed Care, Other (non HMO) | Admitting: Internal Medicine

## 2014-01-11 VITALS — BP 120/70 | HR 69 | Temp 98.0°F | Wt 262.2 lb

## 2014-01-11 DIAGNOSIS — E785 Hyperlipidemia, unspecified: Secondary | ICD-10-CM

## 2014-01-11 DIAGNOSIS — J019 Acute sinusitis, unspecified: Secondary | ICD-10-CM

## 2014-01-11 DIAGNOSIS — I10 Essential (primary) hypertension: Secondary | ICD-10-CM

## 2014-01-11 DIAGNOSIS — E119 Type 2 diabetes mellitus without complications: Secondary | ICD-10-CM

## 2014-01-11 DIAGNOSIS — Z0000 Encounter for general adult medical examination without abnormal findings: Secondary | ICD-10-CM

## 2014-01-11 MED ORDER — AZITHROMYCIN 250 MG PO TABS: ORAL_TABLET | ORAL | Status: DC

## 2014-01-11 NOTE — Assessment & Plan Note (Signed)
Mild to mod, for antibx course,  to f/u any worsening symptoms or concerns 

## 2014-01-11 NOTE — Assessment & Plan Note (Signed)
stable overall by history and exam, recent data reviewed with pt, and pt to continue medical treatment as before,  to f/u any worsening symptoms or concerns  BP Readings from Last 3 Encounters:  01/11/14 120/70  10/27/13 110/68  07/22/13 122/70

## 2014-01-11 NOTE — Assessment & Plan Note (Signed)
stable overall by history and exam, recent data reviewed with pt, and pt to continue medical treatment as before,  to f/u any worsening symptoms or concerns Lab Results  Component Value Date   LDLCALC 58 01/07/2014

## 2014-01-11 NOTE — Assessment & Plan Note (Signed)
Mod uncontrolled on 3 orals, needs to start insulin and he declines, though I went over the options of trujeo, lantus or levemir, and being able to stop the tradjenta, and the risk of not doing so including DM complications such as heart dz or neuropathy Lab Results  Component Value Date   HGBA1C 9.9* 01/07/2014

## 2014-01-11 NOTE — Patient Instructions (Signed)
Please take all new medication as prescribed Please continue all other medications as before, and refills have been done if requested. Please have the pharmacy call with any other refills you may need.  Please continue your efforts at being more active, low cholesterol diabeticdiet, and weight control.  Please call if you change your mind about taking the Trujeo, or Lantus (or Levemir if your insurance will not pay for the first two)  Please return in 6 months, or sooner if needed, with Lab testing done 3-5 days before

## 2014-01-11 NOTE — Progress Notes (Signed)
Pre visit review using our clinic review tool, if applicable. No additional management support is needed unless otherwise documented below in the visit note. 

## 2014-01-11 NOTE — Progress Notes (Signed)
Subjective:    Patient ID: Allen Myers, male    DOB: 1946-05-05, 68 y.o.   MRN: [id number]  HPI   Here with 2-3 days acute onset fever, facial pain, pressure, headache, general weakness and malaise, and greenish d/c, with mild ST and cough, but pt denies chest pain, wheezing, increased sob or doe, orthopnea, PND, increased LE swelling, palpitations, dizziness or syncope.  Here to f/u; overall doing ok,  Pt denies chest pain, increased sob or doe, wheezing, orthopnea, PND, increased LE swelling, palpitations, dizziness or syncope.  Pt denies polydipsia, polyuria, or low sugar symptoms such as weakness or confusion improved with po intake.  Pt denies new neurological symptoms such as new headache, or facial or extremity weakness or numbness.   Pt states overall good compliance with meds, has been trying to follow lower cholesterol, diabetic diet, with wt overall stable,  but little exercise however. Past Medical History  Diagnosis Date  . ALLERGIC RHINITIS 05/07/2007  . DEPRESSION 05/07/2007  . DIABETES MELLITUS, TYPE II 05/07/2007  . HYPERLIPIDEMIA 05/07/2007  . HYPOTHYROIDISM 05/07/2007  . SLEEP APNEA, OBSTRUCTIVE 05/07/2007  . COLONIC POLYPS, HX OF 12/07/2007  . DIVERTICULITIS, HX OF 05/07/2007  . DIVERTICULOSIS, COLON 12/07/2007  . ERECTILE DYSFUNCTION 05/07/2007  . NEPHROLITHIASIS, HX OF 12/07/2007  . HYPERTENSION 05/16/2008    pt denies   Past Surgical History  Procedure Laterality Date  . Colonoscopy    . Polypectomy      reports that he quit smoking about 30 years ago. His smoking use included Cigarettes. He has a 30 pack-year smoking history. He has never used smokeless tobacco. He reports that he drinks about 0.6 ounces of alcohol per week. He reports that he does not use illicit drugs. family history includes Cancer in his brother and cousin; Heart disease in his mother; Lung cancer in his father. There is no history of Colon cancer, Rectal cancer, or Stomach cancer. No Known  Allergies Current Outpatient Prescriptions on File Prior to Visit  Medication Sig Dispense Refill  . acyclovir (ZOVIRAX) 200 MG capsule Take 800 mg by mouth 5 (five) times daily as needed.      Marland Kitchen aspirin 81 MG EC tablet Take 81 mg by mouth daily.        Marland Kitchen buPROPion (WELLBUTRIN XL) 300 MG 24 hr tablet Take 1 tablet (300 mg total) by mouth daily.  90 tablet  2  . levothyroxine (SYNTHROID, LEVOTHROID) 175 MCG tablet TAKE 1 TABLET BY MOUTH EVERY DAY  90 tablet  2  . linagliptin (TRADJENTA) 5 MG TABS tablet Take 1 tablet (5 mg total) by mouth daily.  30 tablet  1  . lisinopril (PRINIVIL,ZESTRIL) 5 MG tablet TAKE 1 TABLET BY MOUTH DAILY  90 tablet  2  . lovastatin (MEVACOR) 20 MG tablet TAKE 1 TABLET BY MOUTH AT BEDTIME  90 tablet  2  . metFORMIN (GLUCOPHAGE) 1000 MG tablet TAKE 1 TABLET BY MOUTH TWICE DAILY WITH MEALS  180 tablet  2  . pioglitazone (ACTOS) 45 MG tablet TAKE 1 TABLET BY MOUTH EVERY DAY  90 tablet  2   No current facility-administered medications on file prior to visit.   Review of Systems  Constitutional: Negative for unexpected weight change, or unusual diaphoresis  HENT: Negative for tinnitus.   Eyes: Negative for photophobia and visual disturbance.  Respiratory: Negative for choking and stridor.   Gastrointestinal: Negative for vomiting and blood in stool.  Genitourinary: Negative for hematuria and decreased urine volume.  Musculoskeletal:  Negative for acute joint swelling Skin: Negative for color change and wound.  Neurological: Negative for tremors and numbness other than noted  Psychiatric/Behavioral: Negative for decreased concentration or  hyperactivity.       Objective:   Physical Exam BP 120/70  Pulse 69  Temp(Src) 98 F (36.7 C) (Oral)  Wt 262 lb 4 oz (118.956 kg)  SpO2 97% VS noted,  Constitutional: Pt appears well-developed and well-nourished.  HENT: Head: NCAT.  Right Ear: External ear normal.  Left Ear: External ear normal.  Eyes: Conjunctivae and  EOM are normal. Pupils are equal, round, and reactive to light.  Neck: Normal range of motion. Neck supple.  Cardiovascular: Normal rate and regular rhythm.   Pulmonary/Chest: Effort normal and breath sounds normal.  Neurological: Pt is alert. Not confused  Skin: Skin is warm. No erythema.  Psychiatric: Pt behavior is normal. Thought content normal.     Assessment & Plan:

## 2014-04-01 ENCOUNTER — Inpatient Hospital Stay (HOSPITAL_COMMUNITY)
Admission: EM | Admit: 2014-04-01 | Discharge: 2014-04-06 | DRG: 085 | Disposition: A | Discharge status: Home / Self Care | Payer: Managed Care, Other (non HMO) | Attending: General Surgery | Admitting: General Surgery

## 2014-04-01 ENCOUNTER — Emergency Department (HOSPITAL_COMMUNITY): Payer: Managed Care, Other (non HMO)

## 2014-04-01 ENCOUNTER — Encounter (HOSPITAL_COMMUNITY): Payer: Self-pay | Admitting: Emergency Medicine

## 2014-04-01 DIAGNOSIS — G4733 Obstructive sleep apnea (adult) (pediatric): Secondary | ICD-10-CM

## 2014-04-01 DIAGNOSIS — M722 Plantar fascial fibromatosis: Secondary | ICD-10-CM

## 2014-04-01 DIAGNOSIS — S069XAA Unspecified intracranial injury with loss of consciousness status unknown, initial encounter: Secondary | ICD-10-CM

## 2014-04-01 DIAGNOSIS — J9600 Acute respiratory failure, unspecified whether with hypoxia or hypercapnia: Secondary | ICD-10-CM | POA: Diagnosis not present

## 2014-04-01 DIAGNOSIS — S06331A Contusion and laceration of cerebrum, unspecified, with loss of consciousness of 30 minutes or less, initial encounter: Secondary | ICD-10-CM

## 2014-04-01 DIAGNOSIS — S066XAA Traumatic subarachnoid hemorrhage with loss of consciousness status unknown, initial encounter: Secondary | ICD-10-CM

## 2014-04-01 DIAGNOSIS — W19XXXA Unspecified fall, initial encounter: Secondary | ICD-10-CM

## 2014-04-01 DIAGNOSIS — Z809 Family history of malignant neoplasm, unspecified: Secondary | ICD-10-CM

## 2014-04-01 DIAGNOSIS — I429 Cardiomyopathy, unspecified: Secondary | ICD-10-CM

## 2014-04-01 DIAGNOSIS — S32009A Unspecified fracture of unspecified lumbar vertebra, initial encounter for closed fracture: Secondary | ICD-10-CM | POA: Diagnosis present

## 2014-04-01 DIAGNOSIS — Z6834 Body mass index (BMI) 34.0-34.9, adult: Secondary | ICD-10-CM

## 2014-04-01 DIAGNOSIS — Z8249 Family history of ischemic heart disease and other diseases of the circulatory system: Secondary | ICD-10-CM

## 2014-04-01 DIAGNOSIS — I4891 Unspecified atrial fibrillation: Secondary | ICD-10-CM

## 2014-04-01 DIAGNOSIS — F3289 Other specified depressive episodes: Secondary | ICD-10-CM

## 2014-04-01 DIAGNOSIS — Z7982 Long term (current) use of aspirin: Secondary | ICD-10-CM

## 2014-04-01 DIAGNOSIS — I214 Non-ST elevation (NSTEMI) myocardial infarction: Secondary | ICD-10-CM

## 2014-04-01 DIAGNOSIS — J309 Allergic rhinitis, unspecified: Secondary | ICD-10-CM

## 2014-04-01 DIAGNOSIS — S06339A Contusion and laceration of cerebrum, unspecified, with loss of consciousness of unspecified duration, initial encounter: Principal | ICD-10-CM | POA: Diagnosis present

## 2014-04-01 DIAGNOSIS — J9601 Acute respiratory failure with hypoxia: Secondary | ICD-10-CM

## 2014-04-01 DIAGNOSIS — I472 Ventricular tachycardia, unspecified: Secondary | ICD-10-CM | POA: Diagnosis not present

## 2014-04-01 DIAGNOSIS — S2239XA Fracture of one rib, unspecified side, initial encounter for closed fracture: Secondary | ICD-10-CM

## 2014-04-01 DIAGNOSIS — S065X9A Traumatic subdural hemorrhage with loss of consciousness of unspecified duration, initial encounter: Secondary | ICD-10-CM

## 2014-04-01 DIAGNOSIS — R569 Unspecified convulsions: Secondary | ICD-10-CM | POA: Diagnosis not present

## 2014-04-01 DIAGNOSIS — S066X9A Traumatic subarachnoid hemorrhage with loss of consciousness of unspecified duration, initial encounter: Secondary | ICD-10-CM

## 2014-04-01 DIAGNOSIS — I4729 Other ventricular tachycardia: Secondary | ICD-10-CM | POA: Diagnosis not present

## 2014-04-01 DIAGNOSIS — I2589 Other forms of chronic ischemic heart disease: Secondary | ICD-10-CM | POA: Diagnosis present

## 2014-04-01 DIAGNOSIS — S065XAA Traumatic subdural hemorrhage with loss of consciousness status unknown, initial encounter: Secondary | ICD-10-CM

## 2014-04-01 DIAGNOSIS — F329 Major depressive disorder, single episode, unspecified: Secondary | ICD-10-CM

## 2014-04-01 DIAGNOSIS — E039 Hypothyroidism, unspecified: Secondary | ICD-10-CM

## 2014-04-01 DIAGNOSIS — E876 Hypokalemia: Secondary | ICD-10-CM | POA: Diagnosis present

## 2014-04-01 DIAGNOSIS — W138XXA Fall from, out of or through other building or structure, initial encounter: Secondary | ICD-10-CM | POA: Diagnosis present

## 2014-04-01 DIAGNOSIS — S32019A Unspecified fracture of first lumbar vertebra, initial encounter for closed fracture: Secondary | ICD-10-CM

## 2014-04-01 DIAGNOSIS — I609 Nontraumatic subarachnoid hemorrhage, unspecified: Secondary | ICD-10-CM

## 2014-04-01 DIAGNOSIS — I951 Orthostatic hypotension: Secondary | ICD-10-CM

## 2014-04-01 DIAGNOSIS — R561 Post traumatic seizures: Secondary | ICD-10-CM | POA: Diagnosis not present

## 2014-04-01 DIAGNOSIS — E785 Hyperlipidemia, unspecified: Secondary | ICD-10-CM

## 2014-04-01 DIAGNOSIS — S2249XA Multiple fractures of ribs, unspecified side, initial encounter for closed fracture: Secondary | ICD-10-CM

## 2014-04-01 DIAGNOSIS — E669 Obesity, unspecified: Secondary | ICD-10-CM | POA: Diagnosis present

## 2014-04-01 DIAGNOSIS — F528 Other sexual dysfunction not due to a substance or known physiological condition: Secondary | ICD-10-CM

## 2014-04-01 DIAGNOSIS — E119 Type 2 diabetes mellitus without complications: Secondary | ICD-10-CM

## 2014-04-01 DIAGNOSIS — S069X9A Unspecified intracranial injury with loss of consciousness of unspecified duration, initial encounter: Secondary | ICD-10-CM

## 2014-04-01 DIAGNOSIS — Z87891 Personal history of nicotine dependence: Secondary | ICD-10-CM

## 2014-04-01 DIAGNOSIS — E66811 Obesity, class 1: Secondary | ICD-10-CM

## 2014-04-01 DIAGNOSIS — K5730 Diverticulosis of large intestine without perforation or abscess without bleeding: Secondary | ICD-10-CM

## 2014-04-01 DIAGNOSIS — S2232XA Fracture of one rib, left side, initial encounter for closed fracture: Secondary | ICD-10-CM

## 2014-04-01 DIAGNOSIS — S32019S Unspecified fracture of first lumbar vertebra, sequela: Secondary | ICD-10-CM

## 2014-04-01 DIAGNOSIS — R55 Syncope and collapse: Secondary | ICD-10-CM

## 2014-04-01 DIAGNOSIS — E871 Hypo-osmolality and hyponatremia: Secondary | ICD-10-CM | POA: Diagnosis present

## 2014-04-01 DIAGNOSIS — I10 Essential (primary) hypertension: Secondary | ICD-10-CM

## 2014-04-01 LAB — CBC WITH DIFFERENTIAL/PLATELET
BASOS ABS: 0 10*3/uL (ref 0.0–0.1)
Basophils Relative: 0 % (ref 0–1)
Eosinophils Absolute: 0 10*3/uL (ref 0.0–0.7)
Eosinophils Relative: 0 % (ref 0–5)
HEMATOCRIT: 41.2 % (ref 39.0–52.0)
Hemoglobin: 13.7 g/dL (ref 13.0–17.0)
LYMPHS PCT: 7 % — AB (ref 12–46)
Lymphs Abs: 1 10*3/uL (ref 0.7–4.0)
MCH: 28.1 pg (ref 26.0–34.0)
MCHC: 33.3 g/dL (ref 30.0–36.0)
MCV: 84.4 fL (ref 78.0–100.0)
Monocytes Absolute: 1.2 10*3/uL — ABNORMAL HIGH (ref 0.1–1.0)
Monocytes Relative: 8 % (ref 3–12)
NEUTROS PCT: 85 % — AB (ref 43–77)
Neutro Abs: 11.8 10*3/uL — ABNORMAL HIGH (ref 1.7–7.7)
PLATELETS: 161 10*3/uL (ref 150–400)
RBC: 4.88 MIL/uL (ref 4.22–5.81)
RDW: 14.2 % (ref 11.5–15.5)
WBC: 13.9 10*3/uL — AB (ref 4.0–10.5)

## 2014-04-01 LAB — COMPREHENSIVE METABOLIC PANEL
ALBUMIN: 3.7 g/dL (ref 3.5–5.2)
ALT: 19 U/L (ref 0–53)
AST: 26 U/L (ref 0–37)
Alkaline Phosphatase: 96 U/L (ref 39–117)
BUN: 25 mg/dL — ABNORMAL HIGH (ref 6–23)
CHLORIDE: 98 meq/L (ref 96–112)
CO2: 21 meq/L (ref 19–32)
Calcium: 9.1 mg/dL (ref 8.4–10.5)
Creatinine, Ser: 0.94 mg/dL (ref 0.50–1.35)
GFR calc Af Amer: >90 mL/min (ref >90)
GFR, EST NON AFRICAN AMERICAN: 85 mL/min — AB (ref >90)
Glucose, Bld: 318 mg/dL — ABNORMAL HIGH (ref 70–99)
POTASSIUM: 4.7 meq/L (ref 3.7–5.3)
SODIUM: 135 meq/L — AB (ref 137–147)
Total Bilirubin: 0.4 mg/dL (ref 0.3–1.2)
Total Protein: 7 g/dL (ref 6.0–8.3)

## 2014-04-01 MED ORDER — SODIUM CHLORIDE 0.9 % IV BOLUS (SEPSIS)
1000.0000 mL | Freq: Once | INTRAVENOUS | Status: AC
  Administered 2014-04-01: 1000 mL via INTRAVENOUS

## 2014-04-01 MED ORDER — LISINOPRIL 5 MG PO TABS
5.0000 mg | ORAL_TABLET | Freq: Every day | ORAL | Status: DC
  Filled 2014-04-01: qty 1

## 2014-04-01 MED ORDER — LEVOTHYROXINE SODIUM 175 MCG PO TABS
175.0000 ug | ORAL_TABLET | Freq: Every day | ORAL | Status: DC
  Administered 2014-04-02: 175 ug via ORAL
  Filled 2014-04-01 (×2): qty 1

## 2014-04-01 MED ORDER — OXYCODONE HCL 5 MG PO TABS
5.0000 mg | ORAL_TABLET | ORAL | Status: DC | PRN
  Administered 2014-04-02: 5 mg via ORAL
  Filled 2014-04-01: qty 1

## 2014-04-01 MED ORDER — ACETAMINOPHEN 325 MG PO TABS: 650.0000 mg | ORAL_TABLET | ORAL | Status: DC | PRN

## 2014-04-01 MED ORDER — ONDANSETRON HCL 4 MG PO TABS: 4.0000 mg | ORAL_TABLET | Freq: Four times a day (QID) | ORAL | Status: DC | PRN

## 2014-04-01 MED ORDER — PANTOPRAZOLE SODIUM 40 MG PO TBEC: 40.0000 mg | DELAYED_RELEASE_TABLET | Freq: Every day | ORAL | Status: DC

## 2014-04-01 MED ORDER — OXYCODONE HCL 5 MG PO TABS
10.0000 mg | ORAL_TABLET | ORAL | Status: DC | PRN
  Administered 2014-04-02: 10 mg via ORAL
  Filled 2014-04-01: qty 2

## 2014-04-01 MED ORDER — ONDANSETRON HCL 4 MG/2ML IJ SOLN: 4.0000 mg | Freq: Four times a day (QID) | INTRAMUSCULAR | Status: DC | PRN

## 2014-04-01 MED ORDER — INSULIN ASPART 100 UNIT/ML ~~LOC~~ SOLN: 0.0000 [IU] | Freq: Three times a day (TID) | SUBCUTANEOUS | Status: DC

## 2014-04-01 MED ORDER — PANTOPRAZOLE SODIUM 40 MG IV SOLR
40.0000 mg | Freq: Every day | INTRAVENOUS | Status: DC
  Administered 2014-04-02 – 2014-04-03 (×2): 40 mg via INTRAVENOUS
  Filled 2014-04-01 (×3): qty 40

## 2014-04-01 MED ORDER — IOHEXOL 300 MG/ML  SOLN
100.0000 mL | Freq: Once | INTRAMUSCULAR | Status: AC | PRN
  Administered 2014-04-01: 100 mL via INTRAVENOUS

## 2014-04-01 MED ORDER — FENTANYL CITRATE 0.05 MG/ML IJ SOLN
50.0000 ug | Freq: Once | INTRAMUSCULAR | Status: AC
  Administered 2014-04-01: 50 ug via INTRAVENOUS
  Filled 2014-04-01: qty 2

## 2014-04-01 MED ORDER — MORPHINE SULFATE 2 MG/ML IJ SOLN
2.0000 mg | INTRAMUSCULAR | Status: DC | PRN
  Administered 2014-04-02: 4 mg via INTRAVENOUS
  Filled 2014-04-01: qty 2

## 2014-04-01 NOTE — ED Notes (Signed)
Wife states she spoke with her neighbor who went on the roof to check on the pt and the neighbor states the pt was not actually shaking he just turned purple and wasn't breathing until the neighbor turned him over to his side and poured water on his face. Pt took 4-5 mins before he got his color back.

## 2014-04-01 NOTE — ED Provider Notes (Signed)
CSN: [id number]     Arrival date & time 04/01/14  1809 History   First MD Initiated Contact with Patient 04/01/14 1830     Chief Complaint  Patient presents with  . Seizures  . Fall     (Consider location/radiation/quality/duration/timing/severity/associated sxs/prior Treatment) The history is provided by the patient and the spouse.    Patient presents with episode of disorientation followed by fall with LOC while on the roof.  Pt went up on the roof, was there for 5-10 minutes then began stumbling around like he was drunk, bumping into things.  Pt states he was very confused and didn't know where he was.  He then fell, remembers falling and hitting the ground, then remembers nothing after that until EMS/Fire department was there attempting to get him off of the roof.  Wife saw the entire event, states he was stumbling around, she thought he was joking, then when he fell she went up halfway and saw him from the waist up, noted that his arms were tight to his body and he was shaking.  She did not see him again until the ED and states he is tired-appearing and otherwise back to his baseline.    Pt denies any preceding chest pain, SOB.  Currently denies pain anywhere but his left temple.  Pain is described as "lightheadedness" or "hunger pain."   Past Medical History  Diagnosis Date  . ALLERGIC RHINITIS 05/07/2007  . DEPRESSION 05/07/2007  . DIABETES MELLITUS, TYPE II 05/07/2007  . HYPERLIPIDEMIA 05/07/2007  . HYPOTHYROIDISM 05/07/2007  . SLEEP APNEA, OBSTRUCTIVE 05/07/2007  . COLONIC POLYPS, HX OF 12/07/2007  . DIVERTICULITIS, HX OF 05/07/2007  . DIVERTICULOSIS, COLON 12/07/2007  . ERECTILE DYSFUNCTION 05/07/2007  . NEPHROLITHIASIS, HX OF 12/07/2007  . HYPERTENSION 05/16/2008    pt denies   Past Surgical History  Procedure Laterality Date  . Colonoscopy    . Polypectomy     Family History  Problem Relation Age of Onset  . Cancer Brother     prostate cancer  . Cancer Cousin     lung  cancer  . Colon cancer Neg Hx   . Rectal cancer Neg Hx   . Stomach cancer Neg Hx   . Lung cancer Father     dad was a smoker  . Heart disease Mother    History  Substance Use Topics  . Smoking status: Former Smoker -- 1.00 packs/day for 30 years    Types: Cigarettes    Quit date: 10/15/1983  . Smokeless tobacco: Never Used  . Alcohol Use: 0.6 oz/week    1 Glasses of wine per week     Comment: social    Review of Systems  All other systems reviewed and are negative.     Allergies  Review of patient's allergies indicates no known allergies.  Home Medications   Prior to Admission medications   Medication Sig Start Date End Date Taking? Authorizing Provider  aspirin 81 MG EC tablet Take 81 mg by mouth daily.     Yes Historical Provider, MD  levothyroxine (SYNTHROID, LEVOTHROID) 175 MCG tablet Take 175 mcg by mouth daily before breakfast.   Yes Historical Provider, MD  linagliptin (TRADJENTA) 5 MG TABS tablet Take 1 tablet (5 mg total) by mouth daily. 10/01/13  Yes Biagio Borg, MD  lisinopril (PRINIVIL,ZESTRIL) 5 MG tablet Take 5 mg by mouth daily.   Yes Historical Provider, MD  lovastatin (MEVACOR) 20 MG tablet Take 20 mg by mouth at bedtime.  Yes Historical Provider, MD  metFORMIN (GLUCOPHAGE) 1000 MG tablet Take 1,000 mg by mouth 2 (two) times daily with a meal.   Yes Historical Provider, MD  pioglitazone (ACTOS) 45 MG tablet Take 45 mg by mouth daily.   Yes Historical Provider, MD   BP 114/63  Pulse 102  Temp(Src) 97.6 F (36.4 C) (Oral)  Resp 20  SpO2 94% Physical Exam  Nursing note and vitals reviewed. Constitutional: He appears well-developed and well-nourished. No distress.  HENT:  Head: Normocephalic and atraumatic.  Eyes: Conjunctivae are normal.  Neck: Neck supple.  Cardiovascular: Normal rate and regular rhythm.   Pulmonary/Chest: Effort normal and breath sounds normal. No respiratory distress. He has no wheezes. He has no rales.  Abdominal: Soft. He  exhibits no distension and no mass. There is tenderness in the left upper quadrant. There is no rebound and no guarding.    Musculoskeletal: He exhibits no edema.  Spine nontender, no crepitus, or stepoffs.  Extremities:  Strength 5/5, sensation intact, distal pulses intact.     Neurological: He is alert. He has normal strength. No cranial nerve deficit or sensory deficit. He exhibits normal muscle tone. Coordination normal. GCS eye subscore is 4. GCS verbal subscore is 5. GCS motor subscore is 6.  Oriented x 4, spells world backwards, able to recall  3 words at 0 and 3 minutes  Skin: He is not diaphoretic.    ED Course  Procedures (including critical care time) Labs Review Labs Reviewed  CBC WITH DIFFERENTIAL - Abnormal; Notable for the following:    WBC 13.9 (*)    Neutrophils Relative % 85 (*)    Neutro Abs 11.8 (*)    Lymphocytes Relative 7 (*)    Monocytes Absolute 1.2 (*)    All other components within normal limits  COMPREHENSIVE METABOLIC PANEL - Abnormal; Notable for the following:    Sodium 135 (*)    Glucose, Bld 318 (*)    BUN 25 (*)    GFR calc non Af Amer 85 (*)    All other components within normal limits  MRSA PCR SCREENING  PROLACTIN  URINE RAPID DRUG SCREEN (HOSP PERFORMED)  ETHANOL  CBC  BASIC METABOLIC PANEL    Imaging Review Ct Head Wo Contrast  04/01/2014   CLINICAL DATA:  Seizure and fall. Concern for head or cervical spine injury.  EXAM: CT HEAD WITHOUT CONTRAST  CT CERVICAL SPINE WITHOUT CONTRAST  TECHNIQUE: Multidetector CT imaging of the head and cervical spine was performed following the standard protocol without intravenous contrast. Multiplanar CT image reconstructions of the cervical spine were also generated.  COMPARISON:  None.  FINDINGS: CT HEAD FINDINGS  Acute subarachnoid hemorrhage is noted tracking along the left temporal and occipital lobes. There is also acute subdural hematoma overlying the right frontal and parietal lobes, measuring up  to 8 mm in thickness, and overlying the left parietal lobe, measuring up to 6 mm in thickness. In addition, bilateral focal intraparenchymal contusions are noted at the vertex, measuring 1.4 cm on the right and 1.1 cm on the left, seen along the gyri.  Given mild underlying cortical volume loss, no significant mass effect or midline shift is currently seen.  The posterior fossa, including the cerebellum, brainstem and fourth ventricle, is within normal limits. The third and lateral ventricles, and basal ganglia are unremarkable in appearance.  There is no evidence of fracture; visualized osseous structures are unremarkable in appearance. The visualized portions of the orbits are within normal limits.  Mucosal thickening is noted within the right maxillary sinus and right sphenoid sinus. The remaining paranasal sinuses and mastoid air cells are well-aerated. No significant soft tissue abnormalities are seen.  CT CERVICAL SPINE FINDINGS  There is no evidence of fracture or subluxation. Vertebral bodies demonstrate normal height and alignment. Minimal multilevel disc space narrowing is noted along the cervical spine. Prevertebral soft tissues are within normal limits. The visualized neural foramina are grossly unremarkable.  The thyroid gland is diminutive and not well assessed. No significant soft tissue abnormalities are seen. Minimal calcification is noted at the carotid bifurcations bilaterally.  IMPRESSION: 1. Acute subarachnoid hemorrhage tracking along the left temporal and occipital lobes. 2. Acute subdural hematoma overlying the right frontal and parietal lobes, measuring up to 8 mm in thickness, and overlying the left parietal lobe, measuring up to 6 mm in thickness. 3. Bilateral focal intraparenchymal contusions noted at the vertex, measuring 1.4 cm on the right and 1.1 cm on the left, seen along the gyri. 4. Given mild underlying cortical volume loss, no significant mass effect or midline shift is currently  seen. 5. No evidence of fracture or subluxation along the cervical spine. 6. Mucosal thickening within the right maxillary sinus and right sphenoid sinus. 7. Minimal calcification noted at the carotid bifurcations bilaterally.  Critical Value/emergent results were called by telephone at the time of interpretation on 04/01/2014 at 9:33 PM to Dr. Clayton Bibles, who verbally acknowledged these results.   Electronically Signed   By: Garald Balding M.D.   On: 04/01/2014 21:33   Ct Chest W Contrast  04/01/2014   CLINICAL DATA:  Trauma scan. fall syncope, low O2; SEIZURES FALL trauma scan , abdominal tenderness LUQ  EXAM: CT CHEST, ABDOMEN, AND PELVIS WITH CONTRAST  TECHNIQUE: Multidetector CT imaging of the chest, abdomen and pelvis was performed following the standard protocol during bolus administration of intravenous contrast.  CONTRAST:  143mL OMNIPAQUE IOHEXOL 300 MG/ML  SOLN  COMPARISON:  None.  FINDINGS: CT CHEST FINDINGS  The thoracic inlet is unremarkable.  No mediastinal nor hilar masses or adenopathy. There is no evidence of mediastinal hematoma. Atherosclerotic calcifications within the aortic arch and coronary vessels. No evidence of a thoracic aortic aneurysm nor dissection.  Scarring versus atelectasis within the dependent portions of the lungs. Minimal emphysematous changes within the lung apices. The lungs are otherwise clear. No evidence of pneumothorax.  CT ABDOMEN AND PELVIS FINDINGS  The liver, spleen, adrenals, pancreas are unremarkable. A nonobstructing 5 mm calculus left kidney. Bilateral benign cysts within the kidneys. Kidneys otherwise unremarkable.  Mild dilation of the infrarenal abdominal aorta measuring 3.3 cm in diameter. Aorta otherwise unremarkable. Atherosclerotic calcifications are appreciated within the abdominal aorta mesenteric and iliac vessels.  No abdominal or pelvic free fluid, loculated fluid collections, masses, nor adenopathy. There is diffuse diverticulosis within the sigmoid  colon. Bowel is otherwise negative. Appendix identified and unremarkable.  No evidence of pneumoperitoneum nor retroperitoneal hematoma.  No abdominal wall no inguinal hernia.  Prostate is prominent measuring 6 cm in diameter.  Nondisplaced transverse process fracture on the right involving L1. Nondisplaced posterior eleventh rib fracture on the left. Nondisplaced fracture lateral ninth rib on the left.  IMPRESSION: Nondisplaced fractures left transverse process L1, posterior an eleventh rib on the left, lateral ninth rib on the left.  No further evidence of posttraumatic chest abdominal or pelvic abnormalities. No evidence of obstructive or inflammatory abnormalities.  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  Diverticulosis.  Bilateral Bosniak  type 1 renal cysts.  Nonobstructing calculus left kidney.   Electronically Signed   By: Margaree Mackintosh M.D.   On: 04/01/2014 21:38   Ct Cervical Spine Wo Contrast  04/01/2014   CLINICAL DATA:  Seizure and fall. Concern for head or cervical spine injury.  EXAM: CT HEAD WITHOUT CONTRAST  CT CERVICAL SPINE WITHOUT CONTRAST  TECHNIQUE: Multidetector CT imaging of the head and cervical spine was performed following the standard protocol without intravenous contrast. Multiplanar CT image reconstructions of the cervical spine were also generated.  COMPARISON:  None.  FINDINGS: CT HEAD FINDINGS  Acute subarachnoid hemorrhage is noted tracking along the left temporal and occipital lobes. There is also acute subdural hematoma overlying the right frontal and parietal lobes, measuring up to 8 mm in thickness, and overlying the left parietal lobe, measuring up to 6 mm in thickness. In addition, bilateral focal intraparenchymal contusions are noted at the vertex, measuring 1.4 cm on the right and 1.1 cm on the left, seen along the gyri.  Given mild underlying cortical volume loss, no significant mass effect or midline shift is currently seen.  The posterior fossa,  including the cerebellum, brainstem and fourth ventricle, is within normal limits. The third and lateral ventricles, and basal ganglia are unremarkable in appearance.  There is no evidence of fracture; visualized osseous structures are unremarkable in appearance. The visualized portions of the orbits are within normal limits. Mucosal thickening is noted within the right maxillary sinus and right sphenoid sinus. The remaining paranasal sinuses and mastoid air cells are well-aerated. No significant soft tissue abnormalities are seen.  CT CERVICAL SPINE FINDINGS  There is no evidence of fracture or subluxation. Vertebral bodies demonstrate normal height and alignment. Minimal multilevel disc space narrowing is noted along the cervical spine. Prevertebral soft tissues are within normal limits. The visualized neural foramina are grossly unremarkable.  The thyroid gland is diminutive and not well assessed. No significant soft tissue abnormalities are seen. Minimal calcification is noted at the carotid bifurcations bilaterally.  IMPRESSION: 1. Acute subarachnoid hemorrhage tracking along the left temporal and occipital lobes. 2. Acute subdural hematoma overlying the right frontal and parietal lobes, measuring up to 8 mm in thickness, and overlying the left parietal lobe, measuring up to 6 mm in thickness. 3. Bilateral focal intraparenchymal contusions noted at the vertex, measuring 1.4 cm on the right and 1.1 cm on the left, seen along the gyri. 4. Given mild underlying cortical volume loss, no significant mass effect or midline shift is currently seen. 5. No evidence of fracture or subluxation along the cervical spine. 6. Mucosal thickening within the right maxillary sinus and right sphenoid sinus. 7. Minimal calcification noted at the carotid bifurcations bilaterally.  Critical Value/emergent results were called by telephone at the time of interpretation on 04/01/2014 at 9:33 PM to Dr. Clayton Bibles, who verbally  acknowledged these results.   Electronically Signed   By: Garald Balding M.D.   On: 04/01/2014 21:33   Ct Abdomen Pelvis W Contrast  04/01/2014   CLINICAL DATA:  Trauma scan. fall syncope, low O2; SEIZURES FALL trauma scan , abdominal tenderness LUQ  EXAM: CT CHEST, ABDOMEN, AND PELVIS WITH CONTRAST  TECHNIQUE: Multidetector CT imaging of the chest, abdomen and pelvis was performed following the standard protocol during bolus administration of intravenous contrast.  CONTRAST:  192mL OMNIPAQUE IOHEXOL 300 MG/ML  SOLN  COMPARISON:  None.  FINDINGS: CT CHEST FINDINGS  The thoracic inlet is unremarkable.  No mediastinal nor hilar masses  or adenopathy. There is no evidence of mediastinal hematoma. Atherosclerotic calcifications within the aortic arch and coronary vessels. No evidence of a thoracic aortic aneurysm nor dissection.  Scarring versus atelectasis within the dependent portions of the lungs. Minimal emphysematous changes within the lung apices. The lungs are otherwise clear. No evidence of pneumothorax.  CT ABDOMEN AND PELVIS FINDINGS  The liver, spleen, adrenals, pancreas are unremarkable. A nonobstructing 5 mm calculus left kidney. Bilateral benign cysts within the kidneys. Kidneys otherwise unremarkable.  Mild dilation of the infrarenal abdominal aorta measuring 3.3 cm in diameter. Aorta otherwise unremarkable. Atherosclerotic calcifications are appreciated within the abdominal aorta mesenteric and iliac vessels.  No abdominal or pelvic free fluid, loculated fluid collections, masses, nor adenopathy. There is diffuse diverticulosis within the sigmoid colon. Bowel is otherwise negative. Appendix identified and unremarkable.  No evidence of pneumoperitoneum nor retroperitoneal hematoma.  No abdominal wall no inguinal hernia.  Prostate is prominent measuring 6 cm in diameter.  Nondisplaced transverse process fracture on the right involving L1. Nondisplaced posterior eleventh rib fracture on the left.  Nondisplaced fracture lateral ninth rib on the left.  IMPRESSION: Nondisplaced fractures left transverse process L1, posterior an eleventh rib on the left, lateral ninth rib on the left.  No further evidence of posttraumatic chest abdominal or pelvic abnormalities. No evidence of obstructive or inflammatory abnormalities.  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  Diverticulosis.  Bilateral Bosniak type 1 renal cysts.  Nonobstructing calculus left kidney.   Electronically Signed   By: Margaree Mackintosh M.D.   On: 04/01/2014 21:38     EKG Interpretation   Date/Time:  Friday April 01 2014 18:25:17 EDT Ventricular Rate:  106 PR Interval:  184 QRS Duration: 93 QT Interval:  375 QTC Calculation: 498 R Axis:   -21 Text Interpretation:  Sinus tachycardia Borderline left axis deviation  Abnormal R-wave progression, early transition Borderline prolonged QT  interval When compared with ECG of 04/30/2001 QT has lengthened Otherwise  no significant change Confirmed by [facility]  MD, Nunzio Cory [phone]) on  04/01/2014 8:05:14 PM      7:27 PM Discussed pt with Dr Thurnell Garbe.   10:04 PM Discussed pt with Dr Ralene Ok who will see patient to consult.  He has reviewed the Head CT.  States all of the injuries appear to be traumatic.  Advised repeat head CT in AM if neurologic exam remains normal.  Requests trauma surgery involvement.  Agrees with step down, states patient does not need ICU care with these injuries at this time.    10:13 PM Discussed pt with Dr Grandville Silos (trauma) who will see and admit the patient.  Dr Kathyrn Sheriff is currently with the patient.    CRITICAL CARE Performed by: Clayton Bibles   Total critical care time: 30  Critical care time was exclusive of separately billable procedures and treating other patients.  Critical care was necessary to treat or prevent imminent or life-threatening deterioration.  Critical care was time spent personally by me on the following activities:  development of treatment plan with patient and/or surrogate as well as nursing, discussions with consultants, evaluation of patient's response to treatment, examination of patient, obtaining history from patient or surrogate, ordering and performing treatments and interventions, ordering and review of laboratory studies, ordering and review of radiographic studies, pulse oximetry and re-evaluation of patient's condition.    MDM   Final diagnoses:  Syncope, unspecified syncope type  Orthostatic hypotension  Subarachnoid hemorrhage  Subdural hematoma  Brain contusion, unspecified laterality, with  loss of consciousness of 30 minutes or less, initial encounter  Rib fractures, left, closed, initial encounter  L1 vertebral fracture, closed, initial encounter  Fall, initial encounter    Pt with episode of disorientation/confusion followed by fall from ground height on top of roof.  Found to have subarachnoid, subdural bleeds and brain contusions, also with 2 rib fractures and L1 fracture.  Found to be orthostatic.  Admitted to Trauma service, consult by neurosurgery, [facility] hospitalist  to consult for syncope.      [place], PA-C 04/02/14 0101

## 2014-04-01 NOTE — ED Notes (Addendum)
68 yo male with witnessed seizure by wife while on the roof repairing the gutter. Pt. Fell from standing position. [facility] transported pt in basket down a ladder on to the grown. Per EMS PT moving all extremities and in post ictal state. A/O x3. Vitals stable. Denies pain. No hx of seizure. Zofran 4mg  given

## 2014-04-01 NOTE — Consult Note (Signed)
CC:  Chief Complaint  Patient presents with  . Seizures  . Fall    HPI: Allen Myers is a 68 y.o. male who presents to the emergency department brought in by EMS after he was on a roof deck and fell. He says that he was doing fine, when he suddenly began to feel disoriented, and lightheaded. He remembers walking into a wall, and then fell down. He did not recall what happened after that, and the next [employer] numbers was EMS personnel bringing him to the hospital. According to his wife and his neighbors, he was "turning blue" while he was on the ground, but afterwards began breathing again. He says that he did not know the syncopal episode was coming on, denying preceding chest pain, headaches, visual changes. He does state that last night he did wake up with fairly significant bilateral calf cramps, as well as some headache, and when he tried to get out of bed he did fall last night hitting the back of his head. Currently, he does complain of some back pain, and headache but otherwise no numbness, tingling, or weakness of the extremities.  PMH: Past Medical History  Diagnosis Date  . ALLERGIC RHINITIS 05/07/2007  . DEPRESSION 05/07/2007  . DIABETES MELLITUS, TYPE II 05/07/2007  . HYPERLIPIDEMIA 05/07/2007  . HYPOTHYROIDISM 05/07/2007  . SLEEP APNEA, OBSTRUCTIVE 05/07/2007  . COLONIC POLYPS, HX OF 12/07/2007  . DIVERTICULITIS, HX OF 05/07/2007  . DIVERTICULOSIS, COLON 12/07/2007  . ERECTILE DYSFUNCTION 05/07/2007  . NEPHROLITHIASIS, HX OF 12/07/2007  . HYPERTENSION 05/16/2008    pt denies    PSH: Past Surgical History  Procedure Laterality Date  . Colonoscopy    . Polypectomy      SH: History  Substance Use Topics  . Smoking status: Former Smoker -- 1.00 packs/day for 30 years    Types: Cigarettes    Quit date: 10/15/1983  . Smokeless tobacco: Never Used  . Alcohol Use: 0.6 oz/week    1 Glasses of wine per week     Comment: social    MEDS: Prior to Admission medications    Medication Sig Start Date End Date Taking? Authorizing Provider  aspirin 81 MG EC tablet Take 81 mg by mouth daily.     Yes Historical Provider, MD  levothyroxine (SYNTHROID, LEVOTHROID) 175 MCG tablet Take 175 mcg by mouth daily before breakfast.   Yes Historical Provider, MD  linagliptin (TRADJENTA) 5 MG TABS tablet Take 1 tablet (5 mg total) by mouth daily. 10/01/13  Yes Biagio Borg, MD  lisinopril (PRINIVIL,ZESTRIL) 5 MG tablet Take 5 mg by mouth daily.   Yes Historical Provider, MD  lovastatin (MEVACOR) 20 MG tablet Take 20 mg by mouth at bedtime.   Yes Historical Provider, MD  metFORMIN (GLUCOPHAGE) 1000 MG tablet Take 1,000 mg by mouth 2 (two) times daily with a meal.   Yes Historical Provider, MD  pioglitazone (ACTOS) 45 MG tablet Take 45 mg by mouth daily.   Yes Historical Provider, MD    ALLERGY: No Known Allergies  ROS: ROS  NEUROLOGIC EXAM: Awake, alert, oriented Memory and concentration grossly intact Speech fluent, appropriate CN grossly intact Motor exam: Upper Extremities Deltoid Bicep Tricep Grip  Right 5/5 5/5 5/5 5/5  Left 5/5 5/5 5/5 5/5   Lower Extremity IP Quad PF DF EHL  Right 5/5 5/5 5/5 5/5 5/5  Left 5/5 5/5 5/5 5/5 5/5   Sensation grossly intact to LT  [facility]: CT scan of the brain demonstrates superficial  left temporal occipital subarachnoid hemorrhage, without any convexity or basal subarachnoid hemorrhage. There is a small right convexity subdural hematoma measuring maximally approximately 8 mm. There is no significant mass effect. There are also 2 small bilateral contusions along the vertex. There is no hydrocephalus. No skull fractures are seen.  CT scan of the abdomen and pelvis demonstrate a nondisplaced left-sided L1 transverse process fracture.  IMPRESSION: - 68 y.o. male who has suffered a syncopal episode of unknown etiology, with resultant traumatic subarachnoid hemorrhage and small right subdural hematoma. - Neurologically  intact  PLAN: - Trauma evaluation and likely admission for observation. He will likely need a syncopal workup - Repeat CT scan of the brain without contrast in 8-12 hours - No intervention is required for the L1 transverse process fracture. The patient can likely wear a lumbar corset when up for comfort.

## 2014-04-01 NOTE — ED Notes (Signed)
Pain med given 

## 2014-04-01 NOTE — H&P (Signed)
Allen Myers is an 68 y.o. male.   Chief Complaint: Back pain after syncope event HPI: Patient was up on the roof of his deck when he became disoriented. His wife witnessed him stumble around the midback. He fell down, hitting his back on a broom. He had positive loss of consciousness. He was initially described as purple but began breathing again. He recalls both feeling disoriented and then regaining consciousness with EMS personnel attending to him. Currently, he complains of back pain. He also notes he did fall down last night getting up out of bed but attributed that to some leg cramps.  Past Medical History  Diagnosis Date  . ALLERGIC RHINITIS 05/07/2007  . DEPRESSION 05/07/2007  . DIABETES MELLITUS, TYPE II 05/07/2007  . HYPERLIPIDEMIA 05/07/2007  . HYPOTHYROIDISM 05/07/2007  . SLEEP APNEA, OBSTRUCTIVE 05/07/2007  . COLONIC POLYPS, HX OF 12/07/2007  . DIVERTICULITIS, HX OF 05/07/2007  . DIVERTICULOSIS, COLON 12/07/2007  . ERECTILE DYSFUNCTION 05/07/2007  . NEPHROLITHIASIS, HX OF 12/07/2007  . HYPERTENSION 05/16/2008    pt denies    Past Surgical History  Procedure Laterality Date  . Colonoscopy    . Polypectomy      Family History  Problem Relation Age of Onset  . Cancer Brother     prostate cancer  . Cancer Cousin     lung cancer  . Colon cancer Neg Hx   . Rectal cancer Neg Hx   . Stomach cancer Neg Hx   . Lung cancer Father     dad was a smoker  . Heart disease Mother    Social History:  reports that he quit smoking about 30 years ago. His smoking use included Cigarettes. He has a 30 pack-year smoking history. He has never used smokeless tobacco. He reports that he drinks about .6 ounces of alcohol per week. He reports that he does not use illicit drugs.  Allergies: No Known Allergies   (Not in a hospital admission)  Results for orders placed during the hospital encounter of 04/01/14 (from the past 48 hour(s))  CBC WITH DIFFERENTIAL     Status: Abnormal   Collection  Time    04/01/14  7:41 PM      Result Value Ref Range   WBC 13.9 (*) 4.0 - 10.5 K/uL   RBC 4.88  4.22 - 5.81 MIL/uL   Hemoglobin 13.7  13.0 - 17.0 g/dL   HCT 41.2  39.0 - 52.0 %   MCV 84.4  78.0 - 100.0 fL   MCH 28.1  26.0 - 34.0 pg   MCHC 33.3  30.0 - 36.0 g/dL   RDW 14.2  11.5 - 15.5 %   Platelets 161  150 - 400 K/uL   Neutrophils Relative % 85 (*) 43 - 77 %   Neutro Abs 11.8 (*) 1.7 - 7.7 K/uL   Lymphocytes Relative 7 (*) 12 - 46 %   Lymphs Abs 1.0  0.7 - 4.0 K/uL   Monocytes Relative 8  3 - 12 %   Monocytes Absolute 1.2 (*) 0.1 - 1.0 K/uL   Eosinophils Relative 0  0 - 5 %   Eosinophils Absolute 0.0  0.0 - 0.7 K/uL   Basophils Relative 0  0 - 1 %   Basophils Absolute 0.0  0.0 - 0.1 K/uL  COMPREHENSIVE METABOLIC PANEL     Status: Abnormal   Collection Time    04/01/14  7:41 PM      Result Value Ref Range   Sodium 135 (*)  137 - 147 mEq/L   Potassium 4.7  3.7 - 5.3 mEq/L   Chloride 98  96 - 112 mEq/L   CO2 21  19 - 32 mEq/L   Glucose, Bld 318 (*) 70 - 99 mg/dL   BUN 25 (*) 6 - 23 mg/dL   Creatinine, Ser 2.19  0.50 - 1.35 mg/dL   Calcium 9.1  8.4 - 47.1 mg/dL   Total Protein 7.0  6.0 - 8.3 g/dL   Albumin 3.7  3.5 - 5.2 g/dL   AST 26  0 - 37 U/L   ALT 19  0 - 53 U/L   Alkaline Phosphatase 96  39 - 117 U/L   Total Bilirubin 0.4  0.3 - 1.2 mg/dL   GFR calc non Af Amer 85 (*) >90 mL/min   GFR calc Af Amer >90  >90 mL/min   Comment: (NOTE)     The eGFR has been calculated using the CKD EPI equation.     This calculation has not been validated in all clinical situations.     eGFR's persistently <90 mL/min signify possible Chronic Kidney     Disease.   Ct Head Wo Contrast  04/01/2014   CLINICAL DATA:  Seizure and fall. Concern for head or cervical spine injury.  EXAM: CT HEAD WITHOUT CONTRAST  CT CERVICAL SPINE WITHOUT CONTRAST  TECHNIQUE: Multidetector CT imaging of the head and cervical spine was performed following the standard protocol without intravenous contrast.  Multiplanar CT image reconstructions of the cervical spine were also generated.  COMPARISON:  None.  FINDINGS: CT HEAD FINDINGS  Acute subarachnoid hemorrhage is noted tracking along the left temporal and occipital lobes. There is also acute subdural hematoma overlying the right frontal and parietal lobes, measuring up to 8 mm in thickness, and overlying the left parietal lobe, measuring up to 6 mm in thickness. In addition, bilateral focal intraparenchymal contusions are noted at the vertex, measuring 1.4 cm on the right and 1.1 cm on the left, seen along the gyri.  Given mild underlying cortical volume loss, no significant mass effect or midline shift is currently seen.  The posterior fossa, including the cerebellum, brainstem and fourth ventricle, is within normal limits. The third and lateral ventricles, and basal ganglia are unremarkable in appearance.  There is no evidence of fracture; visualized osseous structures are unremarkable in appearance. The visualized portions of the orbits are within normal limits. Mucosal thickening is noted within the right maxillary sinus and right sphenoid sinus. The remaining paranasal sinuses and mastoid air cells are well-aerated. No significant soft tissue abnormalities are seen.  CT CERVICAL SPINE FINDINGS  There is no evidence of fracture or subluxation. Vertebral bodies demonstrate normal height and alignment. Minimal multilevel disc space narrowing is noted along the cervical spine. Prevertebral soft tissues are within normal limits. The visualized neural foramina are grossly unremarkable.  The thyroid gland is diminutive and not well assessed. No significant soft tissue abnormalities are seen. Minimal calcification is noted at the carotid bifurcations bilaterally.  IMPRESSION: 1. Acute subarachnoid hemorrhage tracking along the left temporal and occipital lobes. 2. Acute subdural hematoma overlying the right frontal and parietal lobes, measuring up to 8 mm in thickness,  and overlying the left parietal lobe, measuring up to 6 mm in thickness. 3. Bilateral focal intraparenchymal contusions noted at the vertex, measuring 1.4 cm on the right and 1.1 cm on the left, seen along the gyri. 4. Given mild underlying cortical volume loss, no significant mass effect or midline  shift is currently seen. 5. No evidence of fracture or subluxation along the cervical spine. 6. Mucosal thickening within the right maxillary sinus and right sphenoid sinus. 7. Minimal calcification noted at the carotid bifurcations bilaterally.  Critical Value/emergent results were called by telephone at the time of interpretation on 04/01/2014 at 9:33 PM to Dr. Clayton Bibles, who verbally acknowledged these results.   Electronically Signed   By: Garald Balding M.D.   On: 04/01/2014 21:33   Ct Chest W Contrast  04/01/2014   CLINICAL DATA:  Trauma scan. fall syncope, low O2; SEIZURES FALL trauma scan , abdominal tenderness LUQ  EXAM: CT CHEST, ABDOMEN, AND PELVIS WITH CONTRAST  TECHNIQUE: Multidetector CT imaging of the chest, abdomen and pelvis was performed following the standard protocol during bolus administration of intravenous contrast.  CONTRAST:  141mL OMNIPAQUE IOHEXOL 300 MG/ML  SOLN  COMPARISON:  None.  FINDINGS: CT CHEST FINDINGS  The thoracic inlet is unremarkable.  No mediastinal nor hilar masses or adenopathy. There is no evidence of mediastinal hematoma. Atherosclerotic calcifications within the aortic arch and coronary vessels. No evidence of a thoracic aortic aneurysm nor dissection.  Scarring versus atelectasis within the dependent portions of the lungs. Minimal emphysematous changes within the lung apices. The lungs are otherwise clear. No evidence of pneumothorax.  CT ABDOMEN AND PELVIS FINDINGS  The liver, spleen, adrenals, pancreas are unremarkable. A nonobstructing 5 mm calculus left kidney. Bilateral benign cysts within the kidneys. Kidneys otherwise unremarkable.  Mild dilation of the infrarenal  abdominal aorta measuring 3.3 cm in diameter. Aorta otherwise unremarkable. Atherosclerotic calcifications are appreciated within the abdominal aorta mesenteric and iliac vessels.  No abdominal or pelvic free fluid, loculated fluid collections, masses, nor adenopathy. There is diffuse diverticulosis within the sigmoid colon. Bowel is otherwise negative. Appendix identified and unremarkable.  No evidence of pneumoperitoneum nor retroperitoneal hematoma.  No abdominal wall no inguinal hernia.  Prostate is prominent measuring 6 cm in diameter.  Nondisplaced transverse process fracture on the right involving L1. Nondisplaced posterior eleventh rib fracture on the left. Nondisplaced fracture lateral ninth rib on the left.  IMPRESSION: Nondisplaced fractures left transverse process L1, posterior an eleventh rib on the left, lateral ninth rib on the left.  No further evidence of posttraumatic chest abdominal or pelvic abnormalities. No evidence of obstructive or inflammatory abnormalities.  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  Diverticulosis.  Bilateral Bosniak type 1 renal cysts.  Nonobstructing calculus left kidney.   Electronically Signed   By: Margaree Mackintosh M.D.   On: 04/01/2014 21:38   Ct Cervical Spine Wo Contrast  04/01/2014   CLINICAL DATA:  Seizure and fall. Concern for head or cervical spine injury.  EXAM: CT HEAD WITHOUT CONTRAST  CT CERVICAL SPINE WITHOUT CONTRAST  TECHNIQUE: Multidetector CT imaging of the head and cervical spine was performed following the standard protocol without intravenous contrast. Multiplanar CT image reconstructions of the cervical spine were also generated.  COMPARISON:  None.  FINDINGS: CT HEAD FINDINGS  Acute subarachnoid hemorrhage is noted tracking along the left temporal and occipital lobes. There is also acute subdural hematoma overlying the right frontal and parietal lobes, measuring up to 8 mm in thickness, and overlying the left parietal lobe,  measuring up to 6 mm in thickness. In addition, bilateral focal intraparenchymal contusions are noted at the vertex, measuring 1.4 cm on the right and 1.1 cm on the left, seen along the gyri.  Given mild underlying cortical volume loss, no significant mass  effect or midline shift is currently seen.  The posterior fossa, including the cerebellum, brainstem and fourth ventricle, is within normal limits. The third and lateral ventricles, and basal ganglia are unremarkable in appearance.  There is no evidence of fracture; visualized osseous structures are unremarkable in appearance. The visualized portions of the orbits are within normal limits. Mucosal thickening is noted within the right maxillary sinus and right sphenoid sinus. The remaining paranasal sinuses and mastoid air cells are well-aerated. No significant soft tissue abnormalities are seen.  CT CERVICAL SPINE FINDINGS  There is no evidence of fracture or subluxation. Vertebral bodies demonstrate normal height and alignment. Minimal multilevel disc space narrowing is noted along the cervical spine. Prevertebral soft tissues are within normal limits. The visualized neural foramina are grossly unremarkable.  The thyroid gland is diminutive and not well assessed. No significant soft tissue abnormalities are seen. Minimal calcification is noted at the carotid bifurcations bilaterally.  IMPRESSION: 1. Acute subarachnoid hemorrhage tracking along the left temporal and occipital lobes. 2. Acute subdural hematoma overlying the right frontal and parietal lobes, measuring up to 8 mm in thickness, and overlying the left parietal lobe, measuring up to 6 mm in thickness. 3. Bilateral focal intraparenchymal contusions noted at the vertex, measuring 1.4 cm on the right and 1.1 cm on the left, seen along the gyri. 4. Given mild underlying cortical volume loss, no significant mass effect or midline shift is currently seen. 5. No evidence of fracture or subluxation along the  cervical spine. 6. Mucosal thickening within the right maxillary sinus and right sphenoid sinus. 7. Minimal calcification noted at the carotid bifurcations bilaterally.  Critical Value/emergent results were called by telephone at the time of interpretation on 04/01/2014 at 9:33 PM to Dr. Clayton Bibles, who verbally acknowledged these results.   Electronically Signed   By: Garald Balding M.D.   On: 04/01/2014 21:33   Ct Abdomen Pelvis W Contrast  04/01/2014   CLINICAL DATA:  Trauma scan. fall syncope, low O2; SEIZURES FALL trauma scan , abdominal tenderness LUQ  EXAM: CT CHEST, ABDOMEN, AND PELVIS WITH CONTRAST  TECHNIQUE: Multidetector CT imaging of the chest, abdomen and pelvis was performed following the standard protocol during bolus administration of intravenous contrast.  CONTRAST:  158mL OMNIPAQUE IOHEXOL 300 MG/ML  SOLN  COMPARISON:  None.  FINDINGS: CT CHEST FINDINGS  The thoracic inlet is unremarkable.  No mediastinal nor hilar masses or adenopathy. There is no evidence of mediastinal hematoma. Atherosclerotic calcifications within the aortic arch and coronary vessels. No evidence of a thoracic aortic aneurysm nor dissection.  Scarring versus atelectasis within the dependent portions of the lungs. Minimal emphysematous changes within the lung apices. The lungs are otherwise clear. No evidence of pneumothorax.  CT ABDOMEN AND PELVIS FINDINGS  The liver, spleen, adrenals, pancreas are unremarkable. A nonobstructing 5 mm calculus left kidney. Bilateral benign cysts within the kidneys. Kidneys otherwise unremarkable.  Mild dilation of the infrarenal abdominal aorta measuring 3.3 cm in diameter. Aorta otherwise unremarkable. Atherosclerotic calcifications are appreciated within the abdominal aorta mesenteric and iliac vessels.  No abdominal or pelvic free fluid, loculated fluid collections, masses, nor adenopathy. There is diffuse diverticulosis within the sigmoid colon. Bowel is otherwise negative. Appendix  identified and unremarkable.  No evidence of pneumoperitoneum nor retroperitoneal hematoma.  No abdominal wall no inguinal hernia.  Prostate is prominent measuring 6 cm in diameter.  Nondisplaced transverse process fracture on the right involving L1. Nondisplaced posterior eleventh rib fracture on the left. Nondisplaced fracture  lateral ninth rib on the left.  IMPRESSION: Nondisplaced fractures left transverse process L1, posterior an eleventh rib on the left, lateral ninth rib on the left.  No further evidence of posttraumatic chest abdominal or pelvic abnormalities. No evidence of obstructive or inflammatory abnormalities.  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  Diverticulosis.  Bilateral Bosniak type 1 renal cysts.  Nonobstructing calculus left kidney.   Electronically Signed   By: Margaree Mackintosh M.D.   On: 04/01/2014 21:38    Review of Systems  Constitutional: Negative for fever and chills.  HENT: Negative.   Eyes: Negative.   Respiratory: Negative.   Cardiovascular: Negative.   Gastrointestinal: Negative.   Genitourinary: Negative.   Musculoskeletal: Positive for back pain.  Skin: Negative.   Neurological:       See history of present illness  Endo/Heme/Allergies: Negative.   Psychiatric/Behavioral: Negative.     Blood pressure 122/65, pulse 88, temperature 97.6 F (36.4 C), temperature source Oral, resp. rate 14, SpO2 91.00%. Physical Exam  Constitutional: He appears well-developed and well-nourished. No distress.  HENT:  Head: Head is without raccoon's eyes and without contusion.  Right Ear: Hearing, tympanic membrane, external ear and ear canal normal.  Left Ear: Hearing, tympanic membrane, external ear and ear canal normal.  Nose: No sinus tenderness or nasal deformity.  Mouth/Throat: Uvula is midline, oropharynx is clear and moist and mucous membranes are normal.  Eyes: EOM are normal. Pupils are equal, round, and reactive to light. No scleral icterus.   Neck: Normal range of motion. No tracheal deviation present.  No posterior midline tenderness  Cardiovascular: Normal rate, normal heart sounds and intact distal pulses.   Respiratory: Effort normal and breath sounds normal. No stridor. No respiratory distress. He has no wheezes. He has no rales. He exhibits tenderness.  Mild posterior rib tenderness, upper lumbar tenderness without deformity  GI: Soft. He exhibits no distension. There is no tenderness. There is no rebound and no guarding.  Musculoskeletal:  Small abrasion left knee, back pain with movement of lower extremities  Neurological: He is alert. He displays no atrophy and no tremor. No sensory deficit. He exhibits normal muscle tone. He displays no seizure activity. GCS eye subscore is 4. GCS verbal subscore is 5. GCS motor subscore is 6.  Strength good throughout  Skin: Skin is warm and dry.  Psychiatric: He has a normal mood and affect.     Assessment/Plan Syncopal event with fall on roof Traumatic brain injury with subarachnoid hemorrhage and subdural L1 transverse process fracture Left rib fracture #9 and #11  Admit to trauma service  ICU, medical consult for syncope workup. He has been seen by Dr. Kathyrn Sheriff from neurosurgery. Plan followup CT head in the morning. If stable will mobilize with traumatic brain injury team therapy in lumbar corset. I discussed the plan with him and his wife.   THOMPSON,BURKE E 04/01/2014, 10:41 PM

## 2014-04-02 ENCOUNTER — Inpatient Hospital Stay (HOSPITAL_COMMUNITY): Payer: Managed Care, Other (non HMO)

## 2014-04-02 ENCOUNTER — Encounter (HOSPITAL_COMMUNITY): Payer: Self-pay | Admitting: Cardiology

## 2014-04-02 DIAGNOSIS — E785 Hyperlipidemia, unspecified: Secondary | ICD-10-CM

## 2014-04-02 DIAGNOSIS — I609 Nontraumatic subarachnoid hemorrhage, unspecified: Secondary | ICD-10-CM

## 2014-04-02 DIAGNOSIS — I10 Essential (primary) hypertension: Secondary | ICD-10-CM

## 2014-04-02 DIAGNOSIS — I429 Cardiomyopathy, unspecified: Secondary | ICD-10-CM | POA: Clinically undetermined

## 2014-04-02 DIAGNOSIS — S32019A Unspecified fracture of first lumbar vertebra, initial encounter for closed fracture: Secondary | ICD-10-CM

## 2014-04-02 DIAGNOSIS — I214 Non-ST elevation (NSTEMI) myocardial infarction: Secondary | ICD-10-CM | POA: Diagnosis not present

## 2014-04-02 DIAGNOSIS — E039 Hypothyroidism, unspecified: Secondary | ICD-10-CM

## 2014-04-02 DIAGNOSIS — R55 Syncope and collapse: Secondary | ICD-10-CM

## 2014-04-02 DIAGNOSIS — S2239XA Fracture of one rib, unspecified side, initial encounter for closed fracture: Secondary | ICD-10-CM

## 2014-04-02 DIAGNOSIS — R569 Unspecified convulsions: Secondary | ICD-10-CM | POA: Diagnosis not present

## 2014-04-02 DIAGNOSIS — IMO0002 Reserved for concepts with insufficient information to code with codable children: Secondary | ICD-10-CM

## 2014-04-02 DIAGNOSIS — I517 Cardiomegaly: Secondary | ICD-10-CM

## 2014-04-02 DIAGNOSIS — W19XXXA Unspecified fall, initial encounter: Secondary | ICD-10-CM

## 2014-04-02 DIAGNOSIS — I4891 Unspecified atrial fibrillation: Secondary | ICD-10-CM

## 2014-04-02 DIAGNOSIS — I6200 Nontraumatic subdural hemorrhage, unspecified: Secondary | ICD-10-CM

## 2014-04-02 DIAGNOSIS — E119 Type 2 diabetes mellitus without complications: Secondary | ICD-10-CM

## 2014-04-02 DIAGNOSIS — J9600 Acute respiratory failure, unspecified whether with hypoxia or hypercapnia: Secondary | ICD-10-CM

## 2014-04-02 LAB — URINALYSIS, ROUTINE W REFLEX MICROSCOPIC
BILIRUBIN URINE: NEGATIVE
Glucose, UA: >1000 mg/dL — AB
Ketones, ur: NEGATIVE mg/dL
Leukocytes, UA: NEGATIVE
NITRITE: NEGATIVE
Protein, ur: 100 mg/dL — AB
SPECIFIC GRAVITY, URINE: 1.031 — AB (ref 1.005–1.030)
UROBILINOGEN UA: 0.2 mg/dL (ref 0.0–1.0)
pH: 5.5 (ref 5.0–8.0)

## 2014-04-02 LAB — CBC
HCT: 39.8 % (ref 39.0–52.0)
Hemoglobin: 13.4 g/dL (ref 13.0–17.0)
MCH: 28.7 pg (ref 26.0–34.0)
MCHC: 33.7 g/dL (ref 30.0–36.0)
MCV: 85.2 fL (ref 78.0–100.0)
PLATELETS: 174 10*3/uL (ref 150–400)
RBC: 4.67 MIL/uL (ref 4.22–5.81)
RDW: 14.5 % (ref 11.5–15.5)
WBC: 11 10*3/uL — ABNORMAL HIGH (ref 4.0–10.5)

## 2014-04-02 LAB — TROPONIN I
Troponin I: <0.3 ng/mL (ref <0.30)
Troponin I: 0.74 ng/mL (ref <0.30)
Troponin I: 1.51 ng/mL (ref <0.30)

## 2014-04-02 LAB — GLUCOSE, CAPILLARY
GLUCOSE-CAPILLARY: 244 mg/dL — AB (ref 70–99)
Glucose-Capillary: 273 mg/dL — ABNORMAL HIGH (ref 70–99)
Glucose-Capillary: 198 mg/dL — ABNORMAL HIGH (ref 70–99)
Glucose-Capillary: 117 mg/dL — ABNORMAL HIGH (ref 70–99)

## 2014-04-02 LAB — BLOOD GAS, ARTERIAL
Acid-base deficit: 7 mmol/L — ABNORMAL HIGH (ref 0.0–2.0)
BICARBONATE: 18.3 meq/L — AB (ref 20.0–24.0)
Drawn by: 28338
FIO2: 0.6 %
MECHVT: 640 mL
O2 Saturation: 93.7 %
PCO2 ART: 38.6 mmHg (ref 35.0–45.0)
PEEP: 5 cmH2O
Patient temperature: 98.6
RATE: 14 resp/min
TCO2: 19.4 mmol/L (ref 0–100)
pH, Arterial: 7.296 — ABNORMAL LOW (ref 7.350–7.450)
pO2, Arterial: 69.6 mmHg — ABNORMAL LOW (ref 80.0–100.0)

## 2014-04-02 LAB — BASIC METABOLIC PANEL
BUN: 20 mg/dL (ref 6–23)
CHLORIDE: 100 meq/L (ref 96–112)
CO2: 23 mEq/L (ref 19–32)
CREATININE: 0.77 mg/dL (ref 0.50–1.35)
Calcium: 8.6 mg/dL (ref 8.4–10.5)
GFR calc non Af Amer: >90 mL/min (ref >90)
Glucose, Bld: 231 mg/dL — ABNORMAL HIGH (ref 70–99)
Potassium: 4.3 mEq/L (ref 3.7–5.3)
Sodium: 136 mEq/L — ABNORMAL LOW (ref 137–147)

## 2014-04-02 LAB — PHOSPHORUS: PHOSPHORUS: 3.8 mg/dL (ref 2.3–4.6)

## 2014-04-02 LAB — HEMOGLOBIN A1C
Hgb A1c MFr Bld: 9.5 % — ABNORMAL HIGH (ref <5.7)
MEAN PLASMA GLUCOSE: 226 mg/dL — AB (ref <117)

## 2014-04-02 LAB — URINE MICROSCOPIC-ADD ON

## 2014-04-02 LAB — PROTIME-INR
INR: 1.11 (ref 0.00–1.49)
Prothrombin Time: 14.1 seconds (ref 11.6–15.2)

## 2014-04-02 LAB — MRSA PCR SCREENING: MRSA by PCR: NEGATIVE

## 2014-04-02 LAB — TRIGLYCERIDES: TRIGLYCERIDES: 140 mg/dL (ref <150)

## 2014-04-02 LAB — PROCALCITONIN: Procalcitonin: <0.1 ng/mL

## 2014-04-02 LAB — LACTIC ACID, PLASMA: LACTIC ACID, VENOUS: 11.2 mmol/L — AB (ref 0.5–2.2)

## 2014-04-02 LAB — TSH: TSH: 0.107 u[IU]/mL — ABNORMAL LOW (ref 0.350–4.500)

## 2014-04-02 LAB — PRO B NATRIURETIC PEPTIDE: PRO B NATRI PEPTIDE: 271 pg/mL — AB (ref 0–125)

## 2014-04-02 LAB — PROLACTIN: Prolactin: 8.9 ng/mL (ref 2.1–17.1)

## 2014-04-02 LAB — ETHANOL: Alcohol, Ethyl (B): <11 mg/dL (ref 0–11)

## 2014-04-02 LAB — MAGNESIUM: MAGNESIUM: 2.2 mg/dL (ref 1.5–2.5)

## 2014-04-02 MED ORDER — BIOTENE DRY MOUTH MT LIQD
15.0000 mL | Freq: Four times a day (QID) | OROMUCOSAL | Status: DC
  Administered 2014-04-02 (×3): 15 mL via OROMUCOSAL

## 2014-04-02 MED ORDER — MIDAZOLAM HCL 2 MG/2ML IJ SOLN
INTRAMUSCULAR | Status: AC
  Administered 2014-04-02: 2 mg
  Filled 2014-04-02: qty 4

## 2014-04-02 MED ORDER — PIVOT 1.5 CAL PO LIQD
1000.0000 mL | ORAL | Status: DC
Start: 2014-04-02 — End: 2014-04-02
  Filled 2014-04-02 (×2): qty 1000

## 2014-04-02 MED ORDER — INSULIN ASPART 100 UNIT/ML ~~LOC~~ SOLN
0.0000 [IU] | SUBCUTANEOUS | Status: DC
  Administered 2014-04-02: 5 [IU] via SUBCUTANEOUS
  Administered 2014-04-02: 8 [IU] via SUBCUTANEOUS
  Administered 2014-04-02 – 2014-04-03 (×2): 3 [IU] via SUBCUTANEOUS
  Administered 2014-04-03: 2 [IU] via SUBCUTANEOUS
  Administered 2014-04-03 (×3): 3 [IU] via SUBCUTANEOUS
  Administered 2014-04-03: 2 [IU] via SUBCUTANEOUS
  Administered 2014-04-04: 3 [IU] via SUBCUTANEOUS
  Administered 2014-04-04: 2 [IU] via SUBCUTANEOUS

## 2014-04-02 MED ORDER — POTASSIUM CHLORIDE IN NACL 20-0.9 MEQ/L-% IV SOLN
INTRAVENOUS | Status: DC
  Administered 2014-04-02: 1000 mL via INTRAVENOUS
  Filled 2014-04-02 (×3): qty 1000

## 2014-04-02 MED ORDER — BIOTENE DRY MOUTH MT LIQD
15.0000 mL | Freq: Two times a day (BID) | OROMUCOSAL | Status: DC
Start: 2014-04-03 — End: 2014-04-06
  Administered 2014-04-04 – 2014-04-05 (×3): 15 mL via OROMUCOSAL

## 2014-04-02 MED ORDER — FENTANYL CITRATE 0.05 MG/ML IJ SOLN
50.0000 ug | INTRAMUSCULAR | Status: DC | PRN
  Administered 2014-04-02 (×5): 50 ug via INTRAVENOUS
  Filled 2014-04-02 (×4): qty 2

## 2014-04-02 MED ORDER — ADULT MULTIVITAMIN LIQUID CH
5.0000 mL | Freq: Every day | ORAL | Status: DC
  Administered 2014-04-05 – 2014-04-06 (×2): 5 mL
  Filled 2014-04-02 (×5): qty 5

## 2014-04-02 MED ORDER — CHLORHEXIDINE GLUCONATE 0.12 % MT SOLN
15.0000 mL | Freq: Two times a day (BID) | OROMUCOSAL | Status: DC
  Administered 2014-04-02: 15 mL via OROMUCOSAL
  Filled 2014-04-02: qty 15

## 2014-04-02 MED ORDER — LORAZEPAM 2 MG/ML IJ SOLN: 2.0000 mg | INTRAMUSCULAR | Status: DC | PRN

## 2014-04-02 MED ORDER — AMIODARONE HCL IN DEXTROSE 360-4.14 MG/200ML-% IV SOLN
30.0000 mg/h | INTRAVENOUS | Status: DC
  Filled 2014-04-02: qty 200

## 2014-04-02 MED ORDER — BIOTENE DRY MOUTH MT LIQD: 15.0000 mL | Freq: Two times a day (BID) | OROMUCOSAL | Status: DC

## 2014-04-02 MED ORDER — SODIUM CHLORIDE 0.9 % IV SOLN
500.0000 mg | Freq: Two times a day (BID) | INTRAVENOUS | Status: DC
  Administered 2014-04-02 – 2014-04-04 (×5): 500 mg via INTRAVENOUS
  Filled 2014-04-02 (×7): qty 5

## 2014-04-02 MED ORDER — LABETALOL HCL 5 MG/ML IV SOLN
10.0000 mg | INTRAVENOUS | Status: DC | PRN
  Administered 2014-04-02: 10 mg via INTRAVENOUS
  Filled 2014-04-02: qty 4

## 2014-04-02 MED ORDER — FENTANYL CITRATE 0.05 MG/ML IJ SOLN
INTRAMUSCULAR | Status: AC
  Administered 2014-04-02: 100 ug
  Filled 2014-04-02: qty 2

## 2014-04-02 MED ORDER — AMIODARONE HCL IN DEXTROSE 360-4.14 MG/200ML-% IV SOLN
60.0000 mg/h | INTRAVENOUS | Status: DC
  Filled 2014-04-02: qty 200

## 2014-04-02 MED ORDER — PRO-STAT SUGAR FREE PO LIQD
60.0000 mL | Freq: Every day | ORAL | Status: DC
  Filled 2014-04-02 (×4): qty 60

## 2014-04-02 MED ORDER — IOHEXOL 350 MG/ML SOLN
100.0000 mL | Freq: Once | INTRAVENOUS | Status: AC | PRN
  Administered 2014-04-02: 100 mL via INTRAVENOUS

## 2014-04-02 MED ORDER — AMIODARONE LOAD VIA INFUSION
150.0000 mg | Freq: Once | INTRAVENOUS | Status: DC
  Filled 2014-04-02: qty 83.34

## 2014-04-02 MED ORDER — PROPOFOL 10 MG/ML IV EMUL
INTRAVENOUS | Status: AC
  Filled 2014-04-02: qty 100

## 2014-04-02 MED ORDER — VECURONIUM BROMIDE 10 MG IV SOLR
INTRAVENOUS | Status: AC
Start: 2014-04-02 — End: 2014-04-02
  Administered 2014-04-02: 10 mg
  Filled 2014-04-02: qty 10

## 2014-04-02 MED ORDER — LEVOTHYROXINE SODIUM 100 MCG PO TABS
100.0000 ug | ORAL_TABLET | Freq: Every day | ORAL | Status: DC
  Administered 2014-04-03 – 2014-04-06 (×4): 100 ug via ORAL
  Filled 2014-04-02 (×5): qty 1

## 2014-04-02 MED ORDER — CHLORHEXIDINE GLUCONATE 0.12 % MT SOLN
15.0000 mL | Freq: Two times a day (BID) | OROMUCOSAL | Status: DC
  Administered 2014-04-05 – 2014-04-06 (×2): 15 mL via OROMUCOSAL
  Filled 2014-04-02 (×5): qty 15

## 2014-04-02 MED ORDER — PROPOFOL 10 MG/ML IV EMUL
0.0000 ug/kg/min | INTRAVENOUS | Status: DC
  Administered 2014-04-02: 25 ug/kg/min via INTRAVENOUS
  Administered 2014-04-02: 10 ug/kg/min via INTRAVENOUS
  Administered 2014-04-02: 20 ug/kg/min via INTRAVENOUS
  Filled 2014-04-02 (×3): qty 100

## 2014-04-02 MED ORDER — SODIUM CHLORIDE 0.9 % IV SOLN
INTRAVENOUS | Status: DC
  Administered 2014-04-02: 09:00:00 via INTRAVENOUS
  Administered 2014-04-03 – 2014-04-04 (×2): 1000 mL via INTRAVENOUS

## 2014-04-02 NOTE — Progress Notes (Signed)
I saw the patient, participated in the history, exam and medical decision making, and concur with the physician assistant's note above.  See PA note, present at bedside during code, examined pt, coordinated care with CCM, formulated plan, discussed events with wife and daughter.   30 minutes of critical care.   Allen Myers. Redmond Pulling, MD, FACS General, Bariatric, & Minimally Invasive Surgery [facility], [place]

## 2014-04-02 NOTE — Progress Notes (Signed)
07:5o pt alert, oriented x4, neurologically intact, c/o mild h/a, Oxycodone IR 5mg  po given.   08:00 in elevator transporting to scheduled f/u CT scan, pt suddenly became unresponsive, both eyes deviated to left, had major rigid tonic-clonic movements bliaterally, arms > legs, lasting ~ 1 minute.  Pt then became apneic & cyanotic, code blue called as pt was returned to room.  Pt was apneic ~ 30 seconds and resumed snoring respirations spontaneously; pt never lost pulse.  Trauma MD Redmond Pulling) & CCM MD Nelda Marseille) at bedside.

## 2014-04-02 NOTE — Consult Note (Signed)
NEURO HOSPITALIST CONSULT NOTE    Reason for Consult: seizure  HPI:                                                                                                                                          Allen Myers is an 68 y.o. male with a past medical history significant for HTN, hyperlipidemia,DM, OSA, admitted 6/19 after a fall on his roof and possible syncopal episode during which he sustained small right convexity SDH and superficial left temporo-occipital SAH and multiple rib fx and L1 transverse process fx. There is not history of seizures, but today while being transported to radiology suite for a follow up CT brain he sustained a witnessed GTC seizure and required to be intubated for airway protection. He was started on propofol and given a loading dose 1 gram IV keppra without further seizures. Follow up CT brain showed stable appearance of right convexity SDH and superficial left temporo-occipital SAH. Currently sedated with propofol, intubated on the vent.  Past Medical History  Diagnosis Date  . ALLERGIC RHINITIS 05/07/2007  . DEPRESSION 05/07/2007  . DIABETES MELLITUS, TYPE II 05/07/2007  . HYPERLIPIDEMIA 05/07/2007  . HYPOTHYROIDISM 05/07/2007  . SLEEP APNEA, OBSTRUCTIVE 05/07/2007  . COLONIC POLYPS, HX OF 12/07/2007  . DIVERTICULITIS, HX OF 05/07/2007  . DIVERTICULOSIS, COLON 12/07/2007  . ERECTILE DYSFUNCTION 05/07/2007  . NEPHROLITHIASIS, HX OF 12/07/2007  . HYPERTENSION 05/16/2008    pt denies    Past Surgical History  Procedure Laterality Date  . Colonoscopy    . Polypectomy      Family History  Problem Relation Age of Onset  . Cancer Brother     prostate cancer  . Cancer Cousin     lung cancer  . Colon cancer Neg Hx   . Rectal cancer Neg Hx   . Stomach cancer Neg Hx   . Lung cancer Father     dad was a smoker  . Heart disease Mother     Social History:  reports that he quit smoking about 30 years ago. His smoking use included  Cigarettes. He has a 30 pack-year smoking history. He has never used smokeless tobacco. He reports that he drinks about .6 ounces of alcohol per week. He reports that he does not use illicit drugs.  No Known Allergies  MEDICATIONS:  Scheduled: . feeding supplement (PIVOT 1.5 CAL)  1,000 mL Per Tube Q24H  . feeding supplement (PRO-STAT SUGAR FREE 64)  60 mL Per Tube 5 X Daily  . insulin aspart  0-15 Units Subcutaneous 6 times per day  . levETIRAcetam  500 mg Intravenous Q12H  . levothyroxine  175 mcg Oral QAC breakfast  . multivitamin  5 mL Per Tube Daily  . pantoprazole (PROTONIX) IV  40 mg Intravenous Daily     ROS: unable to obtain due to sedation/intubation                                                                                                                    History obtained from chart review  Physical exam: sedated, intubated on the vent. Blood pressure 122/61, pulse 103, temperature 97.8 F (36.6 C), temperature source Axillary, resp. rate 16, height $RemoveBe'6\' 1"'tacIqUscz$  (1.854 m), weight 119.8 kg (264 lb 1.8 oz), SpO2 95.00%. Head: normocephalic. Neck: supple, no bruits, no JVD. Cardiac: no murmurs. Lungs: clear. Abdomen: soft, no tender, no mass. Extremities: no edema. Neurologic Examination:                                                                                                      Mental Status: Sedated with propofol, intubated, but able to follow some simple commands.   Cranial Nerves: Pupils 4 mm bilaterally, reactive. Resists eye exam. No gaze preference. Face seems to be symmetric. Tongue: intubated. Motor: On restrains but is able to squeeze my hands upon commands and moves lower extremities symmetrically. Tone and bulk:normal tone throughout; no atrophy noted Sensory: on propofol, no tested Deep Tendon Reflexes:  1+ all over Plantars: Right:  downgoing   Left: downgoing Cerebellar: Unable to test due to restrains Gait:  Unable to test  Lab Results  Component Value Date/Time   CHOL 128 01/07/2014  8:12 AM    Results for orders placed during the hospital encounter of 04/01/14 (from the past 48 hour(s))  CBC WITH DIFFERENTIAL     Status: Abnormal   Collection Time    04/01/14  7:41 PM      Result Value Ref Range   WBC 13.9 (*) 4.0 - 10.5 K/uL   RBC 4.88  4.22 - 5.81 MIL/uL   Hemoglobin 13.7  13.0 - 17.0 g/dL   HCT 41.2  39.0 - 52.0 %   MCV 84.4  78.0 - 100.0 fL   MCH 28.1  26.0 - 34.0 pg   MCHC 33.3  30.0 - 36.0 g/dL   RDW 14.2  11.5 - 15.5 %  Platelets 161  150 - 400 K/uL   Neutrophils Relative % 85 (*) 43 - 77 %   Neutro Abs 11.8 (*) 1.7 - 7.7 K/uL   Lymphocytes Relative 7 (*) 12 - 46 %   Lymphs Abs 1.0  0.7 - 4.0 K/uL   Monocytes Relative 8  3 - 12 %   Monocytes Absolute 1.2 (*) 0.1 - 1.0 K/uL   Eosinophils Relative 0  0 - 5 %   Eosinophils Absolute 0.0  0.0 - 0.7 K/uL   Basophils Relative 0  0 - 1 %   Basophils Absolute 0.0  0.0 - 0.1 K/uL  COMPREHENSIVE METABOLIC PANEL     Status: Abnormal   Collection Time    04/01/14  7:41 PM      Result Value Ref Range   Sodium 135 (*) 137 - 147 mEq/L   Potassium 4.7  3.7 - 5.3 mEq/L   Chloride 98  96 - 112 mEq/L   CO2 21  19 - 32 mEq/L   Glucose, Bld 318 (*) 70 - 99 mg/dL   BUN 25 (*) 6 - 23 mg/dL   Creatinine, Ser 0.94  0.50 - 1.35 mg/dL   Calcium 9.1  8.4 - 10.5 mg/dL   Total Protein 7.0  6.0 - 8.3 g/dL   Albumin 3.7  3.5 - 5.2 g/dL   AST 26  0 - 37 U/L   ALT 19  0 - 53 U/L   Alkaline Phosphatase 96  39 - 117 U/L   Total Bilirubin 0.4  0.3 - 1.2 mg/dL   GFR calc non Af Amer 85 (*) >90 mL/min   GFR calc Af Amer >90  >90 mL/min   Comment: (NOTE)     The eGFR has been calculated using the CKD EPI equation.     This calculation has not been validated in all clinical situations.     eGFR's persistently <90 mL/min signify possible Chronic Kidney     Disease.   PROLACTIN     Status: None   Collection Time    04/01/14  7:41 PM      Result Value Ref Range   Prolactin 8.9  2.1 - 17.1 ng/mL   Comment: (NOTE)         Reference Ranges:                     Male:                       2.1 -  17.1 ng/ml                     Male:   Pregnant          9.7 - 208.5 ng/mL                               Non Pregnant      2.8 -  29.2 ng/mL                               Post Menopausal   1.8 -  20.3 ng/mL                           Performed at [facility]  MRSA PCR SCREENING     Status: None  Collection Time    04/01/14 11:46 PM      Result Value Ref Range   MRSA by PCR NEGATIVE  NEGATIVE   Comment:            The GeneXpert MRSA Assay (FDA     approved for NASAL specimens     only), is one component of a     comprehensive MRSA colonization     surveillance program. It is not     intended to diagnose MRSA     infection nor to guide or     monitor treatment for     MRSA infections.  ETHANOL     Status: None   Collection Time    04/02/14 12:10 AM      Result Value Ref Range   Alcohol, Ethyl (B) <11  0 - 11 mg/dL   Comment:            LOWEST DETECTABLE LIMIT FOR     SERUM ALCOHOL IS 11 mg/dL     FOR MEDICAL PURPOSES ONLY  CBC     Status: Abnormal   Collection Time    04/02/14  2:45 AM      Result Value Ref Range   WBC 11.0 (*) 4.0 - 10.5 K/uL   RBC 4.67  4.22 - 5.81 MIL/uL   Hemoglobin 13.4  13.0 - 17.0 g/dL   HCT 39.8  39.0 - 52.0 %   MCV 85.2  78.0 - 100.0 fL   MCH 28.7  26.0 - 34.0 pg   MCHC 33.7  30.0 - 36.0 g/dL   RDW 14.5  11.5 - 15.5 %   Platelets 174  150 - 400 K/uL  BASIC METABOLIC PANEL     Status: Abnormal   Collection Time    04/02/14  2:45 AM      Result Value Ref Range   Sodium 136 (*) 137 - 147 mEq/L   Potassium 4.3  3.7 - 5.3 mEq/L   Chloride 100  96 - 112 mEq/L   CO2 23  19 - 32 mEq/L   Glucose, Bld 231 (*) 70 - 99 mg/dL   BUN 20  6 - 23 mg/dL   Creatinine, Ser 0.77  0.50 - 1.35 mg/dL   Calcium 8.6  8.4 -  10.5 mg/dL   GFR calc non Af Amer >90  >90 mL/min   GFR calc Af Amer >90  >90 mL/min   Comment: (NOTE)     The eGFR has been calculated using the CKD EPI equation.     This calculation has not been validated in all clinical situations.     eGFR's persistently <90 mL/min signify possible Chronic Kidney     Disease.  TROPONIN I     Status: None   Collection Time    04/02/14  6:32 AM      Result Value Ref Range   Troponin I <0.30  <0.30 ng/mL   Comment:            Due to the release kinetics of cTnI,     a negative result within the first hours     of the onset of symptoms does not rule out     myocardial infarction with certainty.     If myocardial infarction is still suspected,     repeat the test at appropriate intervals.  TSH     Status: Abnormal   Collection Time    04/02/14  6:32 AM      Result  Value Ref Range   TSH 0.107 (*) 0.350 - 4.500 uIU/mL  GLUCOSE, CAPILLARY     Status: Abnormal   Collection Time    04/02/14  8:30 AM      Result Value Ref Range   Glucose-Capillary 244 (*) 70 - 99 mg/dL  PRO B NATRIURETIC PEPTIDE     Status: Abnormal   Collection Time    04/02/14  8:33 AM      Result Value Ref Range   Pro B Natriuretic peptide (BNP) 271.0 (*) 0 - 125 pg/mL  LACTIC ACID, PLASMA     Status: Abnormal   Collection Time    04/02/14  8:33 AM      Result Value Ref Range   Lactic Acid, Venous 11.2 (*) 0.5 - 2.2 mmol/L  MAGNESIUM     Status: None   Collection Time    04/02/14  8:33 AM      Result Value Ref Range   Magnesium 2.2  1.5 - 2.5 mg/dL  PHOSPHORUS     Status: None   Collection Time    04/02/14  8:33 AM      Result Value Ref Range   Phosphorus 3.8  2.3 - 4.6 mg/dL  TROPONIN I     Status: None   Collection Time    04/02/14  8:33 AM      Result Value Ref Range   Troponin I <0.30  <0.30 ng/mL   Comment:            Due to the release kinetics of cTnI,     a negative result within the first hours     of the onset of symptoms does not rule out      myocardial infarction with certainty.     If myocardial infarction is still suspected,     repeat the test at appropriate intervals.  PROTIME-INR     Status: None   Collection Time    04/02/14  8:33 AM      Result Value Ref Range   Prothrombin Time 14.1  11.6 - 15.2 seconds   INR 1.11  0.00 - 1.49  PROCALCITONIN     Status: None   Collection Time    04/02/14  8:33 AM      Result Value Ref Range   Procalcitonin <0.10     Comment:            Interpretation:     PCT (Procalcitonin) <= 0.5 ng/mL:     Systemic infection (sepsis) is not likely.     Local bacterial infection is possible.     (NOTE)             ICU PCT Algorithm               Non ICU PCT Algorithm        ----------------------------     ------------------------------             PCT < 0.25 ng/mL                 PCT < 0.1 ng/mL         Stopping of antibiotics            Stopping of antibiotics           strongly encouraged.               strongly encouraged.        ----------------------------     ------------------------------  PCT level decrease by               PCT < 0.25 ng/mL           >= 80% from peak PCT           OR PCT 0.25 - 0.5 ng/mL          Stopping of antibiotics                                                 encouraged.         Stopping of antibiotics               encouraged.        ----------------------------     ------------------------------           PCT level decrease by              PCT >= 0.25 ng/mL           < 80% from peak PCT            AND PCT >= 0.5 ng/mL            Continuing antibiotics                                                  encouraged.           Continuing antibiotics                encouraged.        ----------------------------     ------------------------------         PCT level increase compared          PCT > 0.5 ng/mL             with peak PCT AND              PCT >= 0.5 ng/mL             Escalation of antibiotics                                               strongly encouraged.          Escalation of antibiotics            strongly encouraged.  TRIGLYCERIDES     Status: None   Collection Time    04/02/14  8:33 AM      Result Value Ref Range   Triglycerides 140  <150 mg/dL  URINALYSIS, ROUTINE W REFLEX MICROSCOPIC     Status: Abnormal   Collection Time    04/02/14  9:20 AM      Result Value Ref Range   Color, Urine YELLOW  YELLOW   APPearance CLEAR  CLEAR   Specific Gravity, Urine 1.031 (*) 1.005 - 1.030   pH 5.5  5.0 - 8.0   Glucose, UA >1000 (*) NEGATIVE mg/dL   Hgb urine dipstick SMALL (*) NEGATIVE   Bilirubin Urine NEGATIVE  NEGATIVE   Ketones, ur NEGATIVE  NEGATIVE mg/dL   Protein, ur 555 (*)  NEGATIVE mg/dL   Urobilinogen, UA 0.2  0.0 - 1.0 mg/dL   Nitrite NEGATIVE  NEGATIVE   Leukocytes, UA NEGATIVE  NEGATIVE  URINE MICROSCOPIC-ADD ON     Status: None   Collection Time    04/02/14  9:20 AM      Result Value Ref Range   Squamous Epithelial / LPF RARE  RARE   WBC, UA 0-2  <3 WBC/hpf   RBC / HPF 3-6  <3 RBC/hpf   Bacteria, UA RARE  RARE  BLOOD GAS, ARTERIAL     Status: Abnormal   Collection Time    04/02/14  9:30 AM      Result Value Ref Range   FIO2 0.60     Delivery systems VENTILATOR     Mode PRESSURE REGULATED VOLUME CONTROL     VT 640     Rate 14     Peep/cpap 5.0     pH, Arterial 7.296 (*) 7.350 - 7.450   pCO2 arterial 38.6  35.0 - 45.0 mmHg   pO2, Arterial 69.6 (*) 80.0 - 100.0 mmHg   Bicarbonate 18.3 (*) 20.0 - 24.0 mEq/L   TCO2 19.4  0 - 100 mmol/L   Acid-base deficit 7.0 (*) 0.0 - 2.0 mmol/L   O2 Saturation 93.7     Patient temperature 98.6     Collection site LEFT RADIAL     Drawn by [id number]     Sample type ARTERIAL DRAW     Allens test (pass/fail) PASS  PASS    Ct Head Without Contrast  04/02/2014   CLINICAL DATA:  Followup tube lead, patient suffered seizure and cardiac arrest on the way 2 CT department this morning, worrisome for worsening bleed  EXAM: CT HEAD WITHOUT CONTRAST  TECHNIQUE: Contiguous  axial images were obtained from the base of the skull through the vertex without intravenous contrast.  COMPARISON:  04/01/2014  FINDINGS: Again identified is acute subarachnoid hemorrhage in the left temporal and occipital lobes. Acute subdural hematoma over the right frontal and parietal lobes is unchanged at about 6 mm. Posteriorly at the left vertex there is in unchanged acute subdural hematoma measuring 6 mm. A few foci of parenchymal contusions seen bilaterally at the vertex, measuring 13 mm on the right and 7 mm on the left, unchanged. No evidence of vascular territory infarct. Stable cortical atrophy. No hydrocephalus. No intraventricular hemorrhage.  IMPRESSION: Stable intracranial hemorrhage including left-sided temporal and occipital subarachnoid hemorrhage, bilateral subdural hematomas, and bilateral parenchymal contusions at the vertex. No significant mass effect or midline shift. No hydrocephalus.   Electronically Signed   By: Skipper Cliche M.D.   On: 04/02/2014 10:38   Ct Head Wo Contrast  04/01/2014   CLINICAL DATA:  Seizure and fall. Concern for head or cervical spine injury.  EXAM: CT HEAD WITHOUT CONTRAST  CT CERVICAL SPINE WITHOUT CONTRAST  TECHNIQUE: Multidetector CT imaging of the head and cervical spine was performed following the standard protocol without intravenous contrast. Multiplanar CT image reconstructions of the cervical spine were also generated.  COMPARISON:  None.  FINDINGS: CT HEAD FINDINGS  Acute subarachnoid hemorrhage is noted tracking along the left temporal and occipital lobes. There is also acute subdural hematoma overlying the right frontal and parietal lobes, measuring up to 8 mm in thickness, and overlying the left parietal lobe, measuring up to 6 mm in thickness. In addition, bilateral focal intraparenchymal contusions are noted at the vertex, measuring 1.4 cm on the right and 1.1 cm on the  left, seen along the gyri.  Given mild underlying cortical volume loss, no  significant mass effect or midline shift is currently seen.  The posterior fossa, including the cerebellum, brainstem and fourth ventricle, is within normal limits. The third and lateral ventricles, and basal ganglia are unremarkable in appearance.  There is no evidence of fracture; visualized osseous structures are unremarkable in appearance. The visualized portions of the orbits are within normal limits. Mucosal thickening is noted within the right maxillary sinus and right sphenoid sinus. The remaining paranasal sinuses and mastoid air cells are well-aerated. No significant soft tissue abnormalities are seen.  CT CERVICAL SPINE FINDINGS  There is no evidence of fracture or subluxation. Vertebral bodies demonstrate normal height and alignment. Minimal multilevel disc space narrowing is noted along the cervical spine. Prevertebral soft tissues are within normal limits. The visualized neural foramina are grossly unremarkable.  The thyroid gland is diminutive and not well assessed. No significant soft tissue abnormalities are seen. Minimal calcification is noted at the carotid bifurcations bilaterally.  IMPRESSION: 1. Acute subarachnoid hemorrhage tracking along the left temporal and occipital lobes. 2. Acute subdural hematoma overlying the right frontal and parietal lobes, measuring up to 8 mm in thickness, and overlying the left parietal lobe, measuring up to 6 mm in thickness. 3. Bilateral focal intraparenchymal contusions noted at the vertex, measuring 1.4 cm on the right and 1.1 cm on the left, seen along the gyri. 4. Given mild underlying cortical volume loss, no significant mass effect or midline shift is currently seen. 5. No evidence of fracture or subluxation along the cervical spine. 6. Mucosal thickening within the right maxillary sinus and right sphenoid sinus. 7. Minimal calcification noted at the carotid bifurcations bilaterally.  Critical Value/emergent results were called by telephone at the time of  interpretation on 04/01/2014 at 9:33 PM to Dr. Clayton Bibles, who verbally acknowledged these results.   Electronically Signed   By: Garald Balding M.D.   On: 04/01/2014 21:33   Ct Chest W Contrast  04/01/2014   CLINICAL DATA:  Trauma scan. fall syncope, low O2; SEIZURES FALL trauma scan , abdominal tenderness LUQ  EXAM: CT CHEST, ABDOMEN, AND PELVIS WITH CONTRAST  TECHNIQUE: Multidetector CT imaging of the chest, abdomen and pelvis was performed following the standard protocol during bolus administration of intravenous contrast.  CONTRAST:  144mL OMNIPAQUE IOHEXOL 300 MG/ML  SOLN  COMPARISON:  None.  FINDINGS: CT CHEST FINDINGS  The thoracic inlet is unremarkable.  No mediastinal nor hilar masses or adenopathy. There is no evidence of mediastinal hematoma. Atherosclerotic calcifications within the aortic arch and coronary vessels. No evidence of a thoracic aortic aneurysm nor dissection.  Scarring versus atelectasis within the dependent portions of the lungs. Minimal emphysematous changes within the lung apices. The lungs are otherwise clear. No evidence of pneumothorax.  CT ABDOMEN AND PELVIS FINDINGS  The liver, spleen, adrenals, pancreas are unremarkable. A nonobstructing 5 mm calculus left kidney. Bilateral benign cysts within the kidneys. Kidneys otherwise unremarkable.  Mild dilation of the infrarenal abdominal aorta measuring 3.3 cm in diameter. Aorta otherwise unremarkable. Atherosclerotic calcifications are appreciated within the abdominal aorta mesenteric and iliac vessels.  No abdominal or pelvic free fluid, loculated fluid collections, masses, nor adenopathy. There is diffuse diverticulosis within the sigmoid colon. Bowel is otherwise negative. Appendix identified and unremarkable.  No evidence of pneumoperitoneum nor retroperitoneal hematoma.  No abdominal wall no inguinal hernia.  Prostate is prominent measuring 6 cm in diameter.  Nondisplaced transverse process fracture on  the right involving L1.  Nondisplaced posterior eleventh rib fracture on the left. Nondisplaced fracture lateral ninth rib on the left.  IMPRESSION: Nondisplaced fractures left transverse process L1, posterior an eleventh rib on the left, lateral ninth rib on the left.  No further evidence of posttraumatic chest abdominal or pelvic abnormalities. No evidence of obstructive or inflammatory abnormalities.  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  Diverticulosis.  Bilateral Bosniak type 1 renal cysts.  Nonobstructing calculus left kidney.   Electronically Signed   By: Margaree Mackintosh M.D.   On: 04/01/2014 21:38   Ct Angio Chest Pe W/cm &/or Wo Cm  04/02/2014   CLINICAL DATA:  Intracranial hemorrhage, cardiac arrest, intubated, recent fall, suspect possible pulmonary embolism  EXAM: CT ANGIOGRAPHY CHEST WITH CONTRAST  TECHNIQUE: Multidetector CT imaging of the chest was performed using the standard protocol during bolus administration of intravenous contrast. Multiplanar CT image reconstructions and MIPs were obtained to evaluate the vascular anatomy.  CONTRAST:  165mL OMNIPAQUE IOHEXOL 350 MG/ML SOLN  COMPARISON:  04/01/2014  FINDINGS: The patient has been intubated with endotracheal tube tip above the carina.  There is extensive bilateral deep dependent consolidation involving the upper middle and lower lung zones consistent with significant dependent atelectasis. The aerated non dependent portions of the lungs are clear.  No significant hilar or mediastinal adenopathy. No pleural effusion.  There are no filling defects in the pulmonary arterial system.  Scans the upper abdomen are unremarkable.  Review of the MIP images confirms the above findings.  IMPRESSION: No evidence of pulmonary arterial embolism. There has developed significant dependent atelectasis bilaterally.   Electronically Signed   By: Skipper Cliche M.D.   On: 04/02/2014 10:52   Ct Cervical Spine Wo Contrast  04/01/2014   CLINICAL DATA:  Seizure and  fall. Concern for head or cervical spine injury.  EXAM: CT HEAD WITHOUT CONTRAST  CT CERVICAL SPINE WITHOUT CONTRAST  TECHNIQUE: Multidetector CT imaging of the head and cervical spine was performed following the standard protocol without intravenous contrast. Multiplanar CT image reconstructions of the cervical spine were also generated.  COMPARISON:  None.  FINDINGS: CT HEAD FINDINGS  Acute subarachnoid hemorrhage is noted tracking along the left temporal and occipital lobes. There is also acute subdural hematoma overlying the right frontal and parietal lobes, measuring up to 8 mm in thickness, and overlying the left parietal lobe, measuring up to 6 mm in thickness. In addition, bilateral focal intraparenchymal contusions are noted at the vertex, measuring 1.4 cm on the right and 1.1 cm on the left, seen along the gyri.  Given mild underlying cortical volume loss, no significant mass effect or midline shift is currently seen.  The posterior fossa, including the cerebellum, brainstem and fourth ventricle, is within normal limits. The third and lateral ventricles, and basal ganglia are unremarkable in appearance.  There is no evidence of fracture; visualized osseous structures are unremarkable in appearance. The visualized portions of the orbits are within normal limits. Mucosal thickening is noted within the right maxillary sinus and right sphenoid sinus. The remaining paranasal sinuses and mastoid air cells are well-aerated. No significant soft tissue abnormalities are seen.  CT CERVICAL SPINE FINDINGS  There is no evidence of fracture or subluxation. Vertebral bodies demonstrate normal height and alignment. Minimal multilevel disc space narrowing is noted along the cervical spine. Prevertebral soft tissues are within normal limits. The visualized neural foramina are grossly unremarkable.  The thyroid gland is diminutive and not well assessed. No  significant soft tissue abnormalities are seen. Minimal  calcification is noted at the carotid bifurcations bilaterally.  IMPRESSION: 1. Acute subarachnoid hemorrhage tracking along the left temporal and occipital lobes. 2. Acute subdural hematoma overlying the right frontal and parietal lobes, measuring up to 8 mm in thickness, and overlying the left parietal lobe, measuring up to 6 mm in thickness. 3. Bilateral focal intraparenchymal contusions noted at the vertex, measuring 1.4 cm on the right and 1.1 cm on the left, seen along the gyri. 4. Given mild underlying cortical volume loss, no significant mass effect or midline shift is currently seen. 5. No evidence of fracture or subluxation along the cervical spine. 6. Mucosal thickening within the right maxillary sinus and right sphenoid sinus. 7. Minimal calcification noted at the carotid bifurcations bilaterally.  Critical Value/emergent results were called by telephone at the time of interpretation on 04/01/2014 at 9:33 PM to Dr. Clayton Bibles, who verbally acknowledged these results.   Electronically Signed   By: Garald Balding M.D.   On: 04/01/2014 21:33   Ct Abdomen Pelvis W Contrast  04/01/2014   CLINICAL DATA:  Trauma scan. fall syncope, low O2; SEIZURES FALL trauma scan , abdominal tenderness LUQ  EXAM: CT CHEST, ABDOMEN, AND PELVIS WITH CONTRAST  TECHNIQUE: Multidetector CT imaging of the chest, abdomen and pelvis was performed following the standard protocol during bolus administration of intravenous contrast.  CONTRAST:  128mL OMNIPAQUE IOHEXOL 300 MG/ML  SOLN  COMPARISON:  None.  FINDINGS: CT CHEST FINDINGS  The thoracic inlet is unremarkable.  No mediastinal nor hilar masses or adenopathy. There is no evidence of mediastinal hematoma. Atherosclerotic calcifications within the aortic arch and coronary vessels. No evidence of a thoracic aortic aneurysm nor dissection.  Scarring versus atelectasis within the dependent portions of the lungs. Minimal emphysematous changes within the lung apices. The lungs are  otherwise clear. No evidence of pneumothorax.  CT ABDOMEN AND PELVIS FINDINGS  The liver, spleen, adrenals, pancreas are unremarkable. A nonobstructing 5 mm calculus left kidney. Bilateral benign cysts within the kidneys. Kidneys otherwise unremarkable.  Mild dilation of the infrarenal abdominal aorta measuring 3.3 cm in diameter. Aorta otherwise unremarkable. Atherosclerotic calcifications are appreciated within the abdominal aorta mesenteric and iliac vessels.  No abdominal or pelvic free fluid, loculated fluid collections, masses, nor adenopathy. There is diffuse diverticulosis within the sigmoid colon. Bowel is otherwise negative. Appendix identified and unremarkable.  No evidence of pneumoperitoneum nor retroperitoneal hematoma.  No abdominal wall no inguinal hernia.  Prostate is prominent measuring 6 cm in diameter.  Nondisplaced transverse process fracture on the right involving L1. Nondisplaced posterior eleventh rib fracture on the left. Nondisplaced fracture lateral ninth rib on the left.  IMPRESSION: Nondisplaced fractures left transverse process L1, posterior an eleventh rib on the left, lateral ninth rib on the left.  No further evidence of posttraumatic chest abdominal or pelvic abnormalities. No evidence of obstructive or inflammatory abnormalities.  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  Diverticulosis.  Bilateral Bosniak type 1 renal cysts.  Nonobstructing calculus left kidney.   Electronically Signed   By: Margaree Mackintosh M.D.   On: 04/01/2014 21:38   Dg Chest Port 1 View  04/02/2014   CLINICAL DATA:  Endotracheal tube placed  EXAM: PORTABLE CHEST - 1 VIEW  COMPARISON:  Chest CT from yesterday  FINDINGS: Endotracheal tube ends in the mid thoracic trachea.  Prominent cardiac size, accentuated by technique. No cardiomegaly noted on previous CT.  Lower lung volumes with streaky basilar  opacities. No pulmonary edema. No pleural effusion or pneumothorax.  Known left-sided rib  fractures are not well visualized.  IMPRESSION: 1. New endotracheal tube which is in good position. 2. New bibasilar atelectasis.   Electronically Signed   By: Jorje Guild M.D.   On: 04/02/2014 09:16   Assessment/Plan: Early post traumatic seizure. He is able to follow some simple commands despite being on propofol, thus no concerns for NCSE at this moment. Continue keppra. Syncope work up (unclear if his primary event was a syncope or a seizure causing him to fall and have a TBI), and therefore will get EEG. Stable appearance of small right convexity SDH and superficial left temporo-occipital SAH, no surgical intervention needed as per neurosurgery. Will follow up.   Dorian Pod, MD 04/02/2014, 12:07 PM

## 2014-04-02 NOTE — Progress Notes (Signed)
Pt intermittantly agitated, follows commands appropriately w/ propofol gtt on.  Discussed w/ Dr. Nelda Marseille - given good  potential for extubation 6/21 am, will defer placing OGT & starting tube feeds for now, reassess 6/21 am.

## 2014-04-02 NOTE — Progress Notes (Signed)
Chaplain responded to code blue.  Family not present but is en route.  Chaplain let nursing know to follow up if family desires chaplain support at a later time.

## 2014-04-02 NOTE — Progress Notes (Signed)
VASCULAR LAB PRELIMINARY  PRELIMINARY  PRELIMINARY  PRELIMINARY  Carotid Dopplers completed.    Preliminary report:  1-39% ICA stenosis.  Vertebral artery flow is antegrade.  KANADY, CANDACE, RVT 04/02/2014, 10:53 AM

## 2014-04-02 NOTE — Progress Notes (Signed)
Pt seen and examined. Pt was being transported to radiology for f/u [facility] when had GTC with post-ictal airway compromise --> intubation. No hemodynamic instability.  EXAM: Temp:  [97.6 F (36.4 C)-97.8 F (36.6 C)] 97.8 F (36.6 C) (06/20 0835) Pulse Rate:  [71-103] 103 (06/20 0845) Resp:  [11-21] 16 (06/20 0845) BP: (100-156)/(52-133) 122/61 mmHg (06/20 0845) SpO2:  [88 %-97 %] 95 % (06/20 0845) FiO2 (%):  [60 %] 60 % (06/20 0845) Weight:  [119.8 kg (264 lb 1.8 oz)] 119.8 kg (264 lb 1.8 oz) (06/19 2335) Intake/Output     06/19 0701 - 06/20 0700 06/20 0701 - 06/21 0700   I.V. (mL/kg) 700 (5.8)    Total Intake(mL/kg) 700 (5.8)    Urine (mL/kg/hr) 1050 625 (1.9)   Total Output 1050 625   Net -350 -625         On light propofol sedation: Opens eyes to voice Intubated, breathing over vent FC briskly BUE/BLE  LABS: Lab Results  Component Value Date   CREATININE 0.77 04/02/2014   BUN 20 04/02/2014   NA 136* 04/02/2014   K 4.3 04/02/2014   CL 100 04/02/2014   CO2 23 04/02/2014   Lab Results  Component Value Date   WBC 11.0* 04/02/2014   HGB 13.4 04/02/2014   HCT 39.8 04/02/2014   MCV 85.2 04/02/2014   PLT 174 04/02/2014    IMPRESSION: - 68 y.o. male s/p syncopal episode and fall with TBI - syncopal episode of unclear etiology - Post-traumatic SZ  PLAN: - Keppra started, PRN ativan - Repeat CTH - syncopal w/u - Corset when up for left L1 TP fracture  Spoke with family regarding events of this am. I reviewed the plan above. All their questions were answered.

## 2014-04-02 NOTE — Progress Notes (Signed)
Echocardiogram 2D Echocardiogram has been performed.  Darlina Sicilian M 04/02/2014, 11:22 AM

## 2014-04-02 NOTE — Progress Notes (Signed)
[facility]  Progress Note   LOS: 1 day   Subjective: The patient was traveling down to CT scan for repeat CT Head when he began to have tonic-clonic seizures b/l in the elevator.  Prior to going down to CT he was neurologically intact and only complaining of a headache.  A code was called and he was transferred back to 25M.  CCM was consulted due to respiratory distress and mental status changes, and rapid intubation was initiated.     Objective: Vital signs in last 24 hours: Temp:  [97.6 F (36.4 C)-97.8 F (36.6 C)] 97.8 F (36.6 C) (06/20 0447) Pulse Rate:  [71-102] 71 (06/20 0700) Resp:  [11-21] 12 (06/20 0700) BP: (100-156)/(52-133) 115/62 mmHg (06/20 0700) SpO2:  [88 %-97 %] 94 % (06/20 0700) Weight:  [264 lb 1.8 oz (119.8 kg)] 264 lb 1.8 oz (119.8 kg) (06/19 2335)    Lab Results:  CBC  Recent Labs  04/01/14 1941 04/02/14 0245  WBC 13.9* 11.0*  HGB 13.7 13.4  HCT 41.2 39.8  PLT 161 174   BMET  Recent Labs  04/01/14 1941 04/02/14 0245  NA 135* 136*  K 4.7 4.3  CL 98 100  CO2 21 23  GLUCOSE 318* 231*  BUN 25* 20  CREATININE 0.94 0.77  CALCIUM 9.1 8.6    Imaging: Ct Head Wo Contrast  04/01/2014   CLINICAL DATA:  Seizure and fall. Concern for head or cervical spine injury.  EXAM: CT HEAD WITHOUT CONTRAST  CT CERVICAL SPINE WITHOUT CONTRAST  TECHNIQUE: Multidetector CT imaging of the head and cervical spine was performed following the standard protocol without intravenous contrast. Multiplanar CT image reconstructions of the cervical spine were also generated.  COMPARISON:  None.  FINDINGS: CT HEAD FINDINGS  Acute subarachnoid hemorrhage is noted tracking along the left temporal and occipital lobes. There is also acute subdural hematoma overlying the right frontal and parietal lobes, measuring up to 8 mm in thickness, and overlying the left parietal lobe, measuring up to 6 mm in thickness. In addition, bilateral focal intraparenchymal  contusions are noted at the vertex, measuring 1.4 cm on the right and 1.1 cm on the left, seen along the gyri.  Given mild underlying cortical volume loss, no significant mass effect or midline shift is currently seen.  The posterior fossa, including the cerebellum, brainstem and fourth ventricle, is within normal limits. The third and lateral ventricles, and basal ganglia are unremarkable in appearance.  There is no evidence of fracture; visualized osseous structures are unremarkable in appearance. The visualized portions of the orbits are within normal limits. Mucosal thickening is noted within the right maxillary sinus and right sphenoid sinus. The remaining paranasal sinuses and mastoid air cells are well-aerated. No significant soft tissue abnormalities are seen.  CT CERVICAL SPINE FINDINGS  There is no evidence of fracture or subluxation. Vertebral bodies demonstrate normal height and alignment. Minimal multilevel disc space narrowing is noted along the cervical spine. Prevertebral soft tissues are within normal limits. The visualized neural foramina are grossly unremarkable.  The thyroid gland is diminutive and not well assessed. No significant soft tissue abnormalities are seen. Minimal calcification is noted at the carotid bifurcations bilaterally.  IMPRESSION: 1. Acute subarachnoid hemorrhage tracking along the left temporal and occipital lobes. 2. Acute subdural hematoma overlying the right frontal and parietal lobes, measuring up to 8 mm in thickness, and overlying the left parietal lobe, measuring up to 6 mm in thickness. 3. Bilateral focal intraparenchymal  contusions noted at the vertex, measuring 1.4 cm on the right and 1.1 cm on the left, seen along the gyri. 4. Given mild underlying cortical volume loss, no significant mass effect or midline shift is currently seen. 5. No evidence of fracture or subluxation along the cervical spine. 6. Mucosal thickening within the right maxillary sinus and right  sphenoid sinus. 7. Minimal calcification noted at the carotid bifurcations bilaterally.  Critical Value/emergent results were called by telephone at the time of interpretation on 04/01/2014 at 9:33 PM to Dr. Clayton Bibles, who verbally acknowledged these results.   Electronically Signed   By: Garald Balding M.D.   On: 04/01/2014 21:33   Ct Chest W Contrast  04/01/2014   CLINICAL DATA:  Trauma scan. fall syncope, low O2; SEIZURES FALL trauma scan , abdominal tenderness LUQ  EXAM: CT CHEST, ABDOMEN, AND PELVIS WITH CONTRAST  TECHNIQUE: Multidetector CT imaging of the chest, abdomen and pelvis was performed following the standard protocol during bolus administration of intravenous contrast.  CONTRAST:  11mL OMNIPAQUE IOHEXOL 300 MG/ML  SOLN  COMPARISON:  None.  FINDINGS: CT CHEST FINDINGS  The thoracic inlet is unremarkable.  No mediastinal nor hilar masses or adenopathy. There is no evidence of mediastinal hematoma. Atherosclerotic calcifications within the aortic arch and coronary vessels. No evidence of a thoracic aortic aneurysm nor dissection.  Scarring versus atelectasis within the dependent portions of the lungs. Minimal emphysematous changes within the lung apices. The lungs are otherwise clear. No evidence of pneumothorax.  CT ABDOMEN AND PELVIS FINDINGS  The liver, spleen, adrenals, pancreas are unremarkable. A nonobstructing 5 mm calculus left kidney. Bilateral benign cysts within the kidneys. Kidneys otherwise unremarkable.  Mild dilation of the infrarenal abdominal aorta measuring 3.3 cm in diameter. Aorta otherwise unremarkable. Atherosclerotic calcifications are appreciated within the abdominal aorta mesenteric and iliac vessels.  No abdominal or pelvic free fluid, loculated fluid collections, masses, nor adenopathy. There is diffuse diverticulosis within the sigmoid colon. Bowel is otherwise negative. Appendix identified and unremarkable.  No evidence of pneumoperitoneum nor retroperitoneal hematoma.   No abdominal wall no inguinal hernia.  Prostate is prominent measuring 6 cm in diameter.  Nondisplaced transverse process fracture on the right involving L1. Nondisplaced posterior eleventh rib fracture on the left. Nondisplaced fracture lateral ninth rib on the left.  IMPRESSION: Nondisplaced fractures left transverse process L1, posterior an eleventh rib on the left, lateral ninth rib on the left.  No further evidence of posttraumatic chest abdominal or pelvic abnormalities. No evidence of obstructive or inflammatory abnormalities.  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  Diverticulosis.  Bilateral Bosniak type 1 renal cysts.  Nonobstructing calculus left kidney.   Electronically Signed   By: Margaree Mackintosh M.D.   On: 04/01/2014 21:38   Ct Cervical Spine Wo Contrast  04/01/2014   CLINICAL DATA:  Seizure and fall. Concern for head or cervical spine injury.  EXAM: CT HEAD WITHOUT CONTRAST  CT CERVICAL SPINE WITHOUT CONTRAST  TECHNIQUE: Multidetector CT imaging of the head and cervical spine was performed following the standard protocol without intravenous contrast. Multiplanar CT image reconstructions of the cervical spine were also generated.  COMPARISON:  None.  FINDINGS: CT HEAD FINDINGS  Acute subarachnoid hemorrhage is noted tracking along the left temporal and occipital lobes. There is also acute subdural hematoma overlying the right frontal and parietal lobes, measuring up to 8 mm in thickness, and overlying the left parietal lobe, measuring up to 6 mm in thickness. In addition, bilateral  focal intraparenchymal contusions are noted at the vertex, measuring 1.4 cm on the right and 1.1 cm on the left, seen along the gyri.  Given mild underlying cortical volume loss, no significant mass effect or midline shift is currently seen.  The posterior fossa, including the cerebellum, brainstem and fourth ventricle, is within normal limits. The third and lateral ventricles, and basal ganglia are  unremarkable in appearance.  There is no evidence of fracture; visualized osseous structures are unremarkable in appearance. The visualized portions of the orbits are within normal limits. Mucosal thickening is noted within the right maxillary sinus and right sphenoid sinus. The remaining paranasal sinuses and mastoid air cells are well-aerated. No significant soft tissue abnormalities are seen.  CT CERVICAL SPINE FINDINGS  There is no evidence of fracture or subluxation. Vertebral bodies demonstrate normal height and alignment. Minimal multilevel disc space narrowing is noted along the cervical spine. Prevertebral soft tissues are within normal limits. The visualized neural foramina are grossly unremarkable.  The thyroid gland is diminutive and not well assessed. No significant soft tissue abnormalities are seen. Minimal calcification is noted at the carotid bifurcations bilaterally.  IMPRESSION: 1. Acute subarachnoid hemorrhage tracking along the left temporal and occipital lobes. 2. Acute subdural hematoma overlying the right frontal and parietal lobes, measuring up to 8 mm in thickness, and overlying the left parietal lobe, measuring up to 6 mm in thickness. 3. Bilateral focal intraparenchymal contusions noted at the vertex, measuring 1.4 cm on the right and 1.1 cm on the left, seen along the gyri. 4. Given mild underlying cortical volume loss, no significant mass effect or midline shift is currently seen. 5. No evidence of fracture or subluxation along the cervical spine. 6. Mucosal thickening within the right maxillary sinus and right sphenoid sinus. 7. Minimal calcification noted at the carotid bifurcations bilaterally.  Critical Value/emergent results were called by telephone at the time of interpretation on 04/01/2014 at 9:33 PM to Dr. Clayton Bibles, who verbally acknowledged these results.   Electronically Signed   By: Garald Balding M.D.   On: 04/01/2014 21:33   Ct Abdomen Pelvis W Contrast  04/01/2014    CLINICAL DATA:  Trauma scan. fall syncope, low O2; SEIZURES FALL trauma scan , abdominal tenderness LUQ  EXAM: CT CHEST, ABDOMEN, AND PELVIS WITH CONTRAST  TECHNIQUE: Multidetector CT imaging of the chest, abdomen and pelvis was performed following the standard protocol during bolus administration of intravenous contrast.  CONTRAST:  11mL OMNIPAQUE IOHEXOL 300 MG/ML  SOLN  COMPARISON:  None.  FINDINGS: CT CHEST FINDINGS  The thoracic inlet is unremarkable.  No mediastinal nor hilar masses or adenopathy. There is no evidence of mediastinal hematoma. Atherosclerotic calcifications within the aortic arch and coronary vessels. No evidence of a thoracic aortic aneurysm nor dissection.  Scarring versus atelectasis within the dependent portions of the lungs. Minimal emphysematous changes within the lung apices. The lungs are otherwise clear. No evidence of pneumothorax.  CT ABDOMEN AND PELVIS FINDINGS  The liver, spleen, adrenals, pancreas are unremarkable. A nonobstructing 5 mm calculus left kidney. Bilateral benign cysts within the kidneys. Kidneys otherwise unremarkable.  Mild dilation of the infrarenal abdominal aorta measuring 3.3 cm in diameter. Aorta otherwise unremarkable. Atherosclerotic calcifications are appreciated within the abdominal aorta mesenteric and iliac vessels.  No abdominal or pelvic free fluid, loculated fluid collections, masses, nor adenopathy. There is diffuse diverticulosis within the sigmoid colon. Bowel is otherwise negative. Appendix identified and unremarkable.  No evidence of pneumoperitoneum nor retroperitoneal hematoma.  No  abdominal wall no inguinal hernia.  Prostate is prominent measuring 6 cm in diameter.  Nondisplaced transverse process fracture on the right involving L1. Nondisplaced posterior eleventh rib fracture on the left. Nondisplaced fracture lateral ninth rib on the left.  IMPRESSION: Nondisplaced fractures left transverse process L1, posterior an eleventh rib on the left,  lateral ninth rib on the left.  No further evidence of posttraumatic chest abdominal or pelvic abnormalities. No evidence of obstructive or inflammatory abnormalities.  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  Diverticulosis.  Bilateral Bosniak type 1 renal cysts.  Nonobstructing calculus left kidney.   Electronically Signed   By: Margaree Mackintosh M.D.   On: 04/01/2014 21:38     PE: General: WD/WN white male who is laying in bed in significant distress, disoriented/confused and minimally arousable HEENT: head is normocephalic.  Sclera are noninjected.  PERRL.  Ears and nose without any masses or lesions.  No fluid from ears/nose.  Mouth is pink and moist Heart: regular, rate, and rhythm.  Normal s1,s2. No obvious murmurs, gallops, or rubs noted.  Palpable radial and pedal pulses bilaterally Lungs: CTAB, no wheezes, rhonchi, or rales noted.  Respiratory effort labored. Abd: soft, NT/ND, +BS, no masses, hernias, or organomegaly MS: all 4 extremities are symmetrical with no cyanosis, clubbing, or edema. Skin: warm and dry with no masses, lesions, or rashes.  Few scrapes to right hand and right leg. Psych: disoriented, following some commands, seems to move all extremities   Assessment/Plan: Syncopal event with fall on roof (Ground level fall) - syncopal workup per medicine and CCM Traumatic brain injury with subarachnoid hemorrhage and subdural - Dr. Kathyrn Sheriff following L1 transverse process fracture - lumbar corset Left rib fracture #9 and #11 - on vent, pulm toilet when extubated Seizures - workup per medicine Depressed TSH - has h/o hypothyroidism on 121mcg -excessive synthroid? - may need T4/T3 VTE - SCD's, pharm dvt proph on hold for TBI FEN - NPO Dispo -- ICU, CCM intubated to protect airway, repeat CT Brain when stabilized medically, continue syncope workup   Excell Seltzer Pager: [place] PA Pager: [phone]   04/02/2014

## 2014-04-02 NOTE — Progress Notes (Signed)
INITIAL NUTRITION ASSESSMENT  DOCUMENTATION CODES Per approved criteria  -Obesity Unspecified   INTERVENTION: Initiate Pivot 1.5 @ 10 ml/hr via OGT (goal rate)  60 ml Prostat five times per day.    Tube feeding regimen and Propofol provides 1740 kcal (21 kcal/kg IBW), 172 grams of protein, and 182 ml of H2O.   NUTRITION DIAGNOSIS: Inadequate oral intake related to inability to eat as evidenced by NPO status  Goal: Enteral nutrition to provide 60-70% of estimated calorie needs (22-25 kcals/kg ideal body weight) and 100% of estimated protein needs, based on ASPEN guidelines for permissive underfeeding in critically ill obese individuals  Monitor:  Respiratory status, TF initiation/tolerance, weight trend, labs   Reason for Assessment: Consult received to initiate and manage enteral nutrition support.  68 y.o. male  Admitting Dx: <principal problem not specified>  ASSESSMENT: Pt admitted after syncopal episode and fall on roof with TBI, subdural and subarachnoid hemorrhages, L1 transverse process fx, and left rib fractures. Pt developed seizures and intubated.   Patient is currently intubated on ventilator support MV: 11.1 L/min Temp (24hrs), Avg:97.7 F (36.5 C), Min:97.6 F (36.4 C), Max:97.8 F (36.6 C)  Propofol: 14.4 ml/hr providing 380 kcal per day from lipid Nutrition-focused physical exam WNL.   Height: Ht Readings from Last 1 Encounters:  04/01/14 6\' 1"  (1.854 m)    Weight: Wt Readings from Last 1 Encounters:  04/01/14 264 lb 1.8 oz (119.8 kg)    Ideal Body Weight: 83.6 kg   % Ideal Body Weight: 143%  Wt Readings from Last 10 Encounters:  04/01/14 264 lb 1.8 oz (119.8 kg)  01/11/14 262 lb 4 oz (118.956 kg)  10/27/13 253 lb (114.76 kg)  07/22/13 265 lb 9.6 oz (120.475 kg)  06/25/13 266 lb (120.657 kg)  06/15/13 267 lb 4 oz (121.224 kg)  12/31/12 270 lb 4 oz (122.585 kg)  11/12/12 275 lb (124.739 kg)  09/29/12 270 lb 12 oz (122.811 kg)  09/03/12  275 lb 9.6 oz (125.011 kg)    Usual Body Weight: 250-270 lb   % Usual Body Weight: 100%  BMI:  Body mass index is 34.85 kg/(m^2).  Estimated Nutritional Needs: Kcal: 2194 Protein: >/= 167 grams Fluid: > 2 L/day  Skin: no issues noted  Diet Order:    EDUCATION NEEDS: -No education needs identified at this time   Intake/Output Summary (Last 24 hours) at 04/02/14 1109 Last data filed at 04/02/14 0900  Gross per 24 hour  Intake    700 ml  Output   1675 ml  Net   -975 ml    Last BM: PTA   Labs:   Recent Labs Lab 04/01/14 1941 04/02/14 0245 04/02/14 0833  NA 135* 136*  --   K 4.7 4.3  --   CL 98 100  --   CO2 21 23  --   BUN 25* 20  --   CREATININE 0.94 0.77  --   CALCIUM 9.1 8.6  --   MG  --   --  2.2  PHOS  --   --  3.8  GLUCOSE 318* 231*  --     CBG (last 3)   Recent Labs  04/02/14 0830  GLUCAP 244*   Lab Results  Component Value Date   HGBA1C 9.9* 01/07/2014   Scheduled Meds: . insulin aspart  0-15 Units Subcutaneous 6 times per day  . levETIRAcetam  500 mg Intravenous Q12H  . levothyroxine  175 mcg Oral QAC breakfast  . pantoprazole (PROTONIX)  IV  40 mg Intravenous Daily    Continuous Infusions: . sodium chloride 50 mL/hr at 04/02/14 0911  . propofol 20 mcg/kg/min (04/02/14 1034)    Past Medical History  Diagnosis Date  . ALLERGIC RHINITIS 05/07/2007  . DEPRESSION 05/07/2007  . DIABETES MELLITUS, TYPE II 05/07/2007  . HYPERLIPIDEMIA 05/07/2007  . HYPOTHYROIDISM 05/07/2007  . SLEEP APNEA, OBSTRUCTIVE 05/07/2007  . COLONIC POLYPS, HX OF 12/07/2007  . DIVERTICULITIS, HX OF 05/07/2007  . DIVERTICULOSIS, COLON 12/07/2007  . ERECTILE DYSFUNCTION 05/07/2007  . NEPHROLITHIASIS, HX OF 12/07/2007  . HYPERTENSION 05/16/2008    pt denies    Past Surgical History  Procedure Laterality Date  . Colonoscopy    . Polypectomy      [place], [place], [place] Pager [phone] After Hours Pager

## 2014-04-02 NOTE — Consult Note (Signed)
[facility] Medical Consultation  Allen Myers RKY:[id number] DOB: [birth year] DOA: 04/01/2014 PCP: Cathlean Cower, MD   Requesting physician: Dr. Georganna Skeans Date of consultation:  04/02/2014 Reason for consultation:    Syncope workup  Impression/Recommendations Active Problems:   SAH (subarachnoid hemorrhage)   Syncope   DM2   HTN   Hypothyroid   Hyperlipidemia   Subdural Heamtoma   RIB Fxs of  Left Lateral Ribs #9+#11, and Cerebral contusion, and L1 Vertebral Fracture     1.   Syncope-  Syncope workup initiated,  Cardiac monitoring,  Neurologic checks,  Orthostatic Vital signs, Repeat imaging in  AM for progression of SAH by Repeat CT scan of the Brain or MRI/MRA of the Brain.        Carotid US and 2D Echo this AM.  Monitor Glucose levels and oxygen levels.   Check TSH,     2.  DM2- Hold Metformin Rx due to IV dye Imaging Studies,  Hold Tradjenta Rx covera with SSI coverage PRN.   Check HbA1c.     3.  HTN-   Monitor BPs,  On Lisinopril Rx.     4.  Hyperlipidemia- on Mevacor Rx.     5.  Hypothyroid-  continue Levothyroxine Rx, check TSH level.     6.  Acute SAH, and Acute Subdural Hematoma and Cerbral Contusion-  Seen by Jerelyn Charles MD Dr. Georganna Skeans, and By Neurosurgery  Dr in ED.     7.  Rib Fractures and L1 Vertebral fracture-  Seen By Trauma Dr. Georganna Skeans.     The [facility] will followup again tomorrow. Please contact the assigned [facility] MD if in need of Assistance.  Thank you for this consultation.    Chief Complaint: Syncope and Fall off Roof  HPI: Allen Myers, a 68 year old male with a history of DM2, HTN, Hyperlipidemia, Hypothyroidism and OSA who was up on the roof of his deck cleaning out the gutters when he had a sudden feeling as if drunk, he felt disoriented and began to walk in circles and walked into the wall on the roof and then he woke up on the ground with EMS around him.   He had passed out and fallen backward about  15 feet onto the ground.  His wife had witnessed his fall.   He was observed to slightly shiver all over, and at one point he had turned purple and was not breathing.  His neighbor splashed water on him and he began to breathe.    He denies having a prodrome of headache or chest pain.   He denies having any similar events.   He reports having a severe headache last night, and it is rare for him to have headaches.   He also reports having an episode of severe cramping of both lower legs and weakness afterward in both legs.     He was evaluated in the ED by the Trauma team and was found to have multiple Injuries including a Subarachnoid hemorrhage, Subdural Hematoma,  Cerebral Contusion,  L-1 non - displaced Vertebral Fractures and Fractures of Lateral Left Ribs #9 and #11 seen on CT scans.      Review of Systems:  Constitutional: No Weight Loss, No Weight Gain, Night Sweats, Fevers, Chills, Fatigue, or Generalized Weakness HEENT:  +Recent Headache, Difficulty Swallowing,Tooth/Dental Problems,Sore Throat,  No Sneezing, Rhinitis, Ear Ache, Nasal Congestion, or Post Nasal Drip,  Cardio-vascular:  No Chest pain, Orthopnea, PND, Edema  in lower extremities, Anasarca, Dizziness, Palpitations  Resp: No Dyspnea, No DOE, No Productive Cough, No Non-Productive Cough, No Hemoptysis, No Change in Color of Mucus,  No Wheezing.    GI: No Heartburn, Indigestion, Abdominal Pain, Nausea, Vomiting, Diarrhea, Change in Bowel Habits,  Loss of Appetite  GU: No Dysuria, Change in Color of Urine, No Urgency or Frequency.  No flank pain.  Musculoskeletal:  No Joint Pain or Swelling.  No Decreased Range of Motion. No Back Pain.  Neurologic: +Syncope, No Seizures,  +Muscle Cramping,  Muscle Weakness, Paresthesia, Vision Disturbance or Loss, No Diplopia, No Vertigo, No Difficulty Walking,  Skin: No Rash or Lesions. Psych: No Change in Mood or Affect. No Depression or Anxiety. No Memory loss. No Confusion or  Hallucinations   Past Medical History  Diagnosis Date  . ALLERGIC RHINITIS 05/07/2007  . DEPRESSION 05/07/2007  . DIABETES MELLITUS, TYPE II 05/07/2007  . HYPERLIPIDEMIA 05/07/2007  . HYPOTHYROIDISM 05/07/2007  . SLEEP APNEA, OBSTRUCTIVE 05/07/2007  . COLONIC POLYPS, HX OF 12/07/2007  . DIVERTICULITIS, HX OF 05/07/2007  . DIVERTICULOSIS, COLON 12/07/2007  . ERECTILE DYSFUNCTION 05/07/2007  . NEPHROLITHIASIS, HX OF 12/07/2007  . HYPERTENSION 05/16/2008    pt denies   Past Surgical History  Procedure Laterality Date  . Colonoscopy    . Polypectomy      No Known Allergies   Social History:  reports that he quit smoking about 30 years ago. His smoking use included Cigarettes. He has a 30 pack-year smoking history. He has never used smokeless tobacco. He reports that he drinks about .6 ounces of alcohol per week. He reports that he does not use illicit drugs.   Family History  Problem Relation Age of Onset  . Cancer Brother     prostate cancer  . Cancer Cousin     lung cancer  . Colon cancer Neg Hx   . Rectal cancer Neg Hx   . Stomach cancer Neg Hx   . Lung cancer Father     dad was a smoker  . Heart disease Mother     Prior to Admission medications   Medication Sig Start Date End Date Taking? Authorizing Provider  aspirin 81 MG EC tablet Take 81 mg by mouth daily.     Yes Historical Provider, MD  levothyroxine (SYNTHROID, LEVOTHROID) 175 MCG tablet Take 175 mcg by mouth daily before breakfast.   Yes Historical Provider, MD  linagliptin (TRADJENTA) 5 MG TABS tablet Take 1 tablet (5 mg total) by mouth daily. 10/01/13  Yes Biagio Borg, MD  lisinopril (PRINIVIL,ZESTRIL) 5 MG tablet Take 5 mg by mouth daily.   Yes Historical Provider, MD  lovastatin (MEVACOR) 20 MG tablet Take 20 mg by mouth at bedtime.   Yes Historical Provider, MD  metFORMIN (GLUCOPHAGE) 1000 MG tablet Take 1,000 mg by mouth 2 (two) times daily with a meal.   Yes Historical Provider, MD  pioglitazone (ACTOS) 45 MG  tablet Take 45 mg by mouth daily.   Yes Historical Provider, MD     Physical Exam:  GEN:  Pleasant Obese  68 year old Caucasian male examined and in no acute distress; cooperative with exam Filed Vitals:   04/02/14 0330 04/02/14 0400 04/02/14 0430 04/02/14 0447  BP: 123/66 109/65 105/66   Pulse: 74 75 73   Temp:    97.8 F (36.6 C)  TempSrc:    Oral  Resp: 18 11 12    Height:      Weight:  SpO2: 95% 94% 94%    Blood pressure 105/66, pulse 73, temperature 97.8 F (36.6 C), temperature source Oral, resp. rate 12, height 6\' 1"  (1.854 m), weight 119.8 kg (264 lb 1.8 oz), SpO2 94.00%. PSYCH: He is alert and oriented x4; does not appear anxious does not appear depressed; affect is normal HEENT: Normocephalic and Atraumatic, Mucous membranes pink; PERRLA; EOM intact; Fundi:  Benign;  No scleral icterus, Nares: Patent, Oropharynx: Clear, Fair Dentition, Neck:  FROM, no cervical lymphadenopathy nor thyromegaly or carotid bruit; no JVD; Breasts:: Not examined CHEST WALL: No tenderness CHEST: Normal respiration, clear to auscultation bilaterally HEART: Regular rate and rhythm; no murmurs rubs or gallops BACK: No kyphosis or scoliosis; no CVA tenderness ABDOMEN: Positive Bowel Sounds,  Obese, soft non-tender; no masses, no organomegaly Rectal Exam: Not done EXTREMITIES: No cyanosis, clubbing or edema; no ulcerations. Genitalia: not examined PULSES: 2+ and symmetric SKIN: Normal hydration no rash or ulceration CNS:  Mental Status: Alert, oriented, thought content appropriate. Speech fluent without evidence of aphasia. Able to follow 3 step commands without difficulty. In No obvious pain.  Cranial Nerves: II: Optic Discs flat bilaterally; Visual fields Intact,  Pupils equal and reactive.   III,IV, VI:  extra-ocular motions intact bilaterally   V,VII: smile symmetric, facial light touch sensation normal bilaterally   VIII: hearing  intact bilaterally   IX,X: gag reflex present   XI:  bilateral shoulder shrug   XII: midline tongue extension  Motor:     Right : Upper extremity 5/5     Left: Upper extremity 5/5       Right Lower extremity 5/5       Left  Lower extremity 5/5       Tone and bulk:normal tone throughout; no atrophy noted  Sensory: Pinprick and light touch intact throughout, bilaterally  Deep Tendon Reflexes: 2+ and symmetric throughout  Plantars/ Babinski: Right: normal Left: normal   Cerebellar: Finger to nose without difficulty.  Gait: deferred  Vascular: pulses palpable throughout     Labs on Admission:  Basic Metabolic Panel:  Recent Labs Lab 04/01/14 1941 04/02/14 0245  NA 135* 136*  K 4.7 4.3  CL 98 100  CO2 21 23  GLUCOSE 318* 231*  BUN 25* 20  CREATININE 0.94 0.77  CALCIUM 9.1 8.6   Liver Function Tests:  Recent Labs Lab 04/01/14 1941  AST 26  ALT 19  ALKPHOS 96  BILITOT 0.4  PROT 7.0  ALBUMIN 3.7   No results found for this basename: LIPASE, AMYLASE,  in the last 168 hours No results found for this basename: AMMONIA,  in the last 168 hours CBC:  Recent Labs Lab 04/01/14 1941 04/02/14 0245  WBC 13.9* 11.0*  NEUTROABS 11.8*  --   HGB 13.7 13.4  HCT 41.2 39.8  MCV 84.4 85.2  PLT 161 174   Cardiac Enzymes: No results found for this basename: CKTOTAL, CKMB, CKMBINDEX, TROPONINI,  in the last 168 hours BNP: No components found with this basename: POCBNP,  CBG: No results found for this basename: GLUCAP,  in the last 168 hours  Radiological Exams on Admission: Ct Head Wo Contrast  04/01/2014   CLINICAL DATA:  Seizure and fall. Concern for head or cervical spine injury.  EXAM: CT HEAD WITHOUT CONTRAST  CT CERVICAL SPINE WITHOUT CONTRAST  TECHNIQUE: Multidetector CT imaging of the head and cervical spine was performed following the standard protocol without intravenous contrast. Multiplanar CT image reconstructions of the cervical spine were also generated.  COMPARISON:  None.  FINDINGS: CT HEAD FINDINGS  Acute  subarachnoid hemorrhage is noted tracking along the left temporal and occipital lobes. There is also acute subdural hematoma overlying the right frontal and parietal lobes, measuring up to 8 mm in thickness, and overlying the left parietal lobe, measuring up to 6 mm in thickness. In addition, bilateral focal intraparenchymal contusions are noted at the vertex, measuring 1.4 cm on the right and 1.1 cm on the left, seen along the gyri.  Given mild underlying cortical volume loss, no significant mass effect or midline shift is currently seen.  The posterior fossa, including the cerebellum, brainstem and fourth ventricle, is within normal limits. The third and lateral ventricles, and basal ganglia are unremarkable in appearance.  There is no evidence of fracture; visualized osseous structures are unremarkable in appearance. The visualized portions of the orbits are within normal limits. Mucosal thickening is noted within the right maxillary sinus and right sphenoid sinus. The remaining paranasal sinuses and mastoid air cells are well-aerated. No significant soft tissue abnormalities are seen.  CT CERVICAL SPINE FINDINGS  There is no evidence of fracture or subluxation. Vertebral bodies demonstrate normal height and alignment. Minimal multilevel disc space narrowing is noted along the cervical spine. Prevertebral soft tissues are within normal limits. The visualized neural foramina are grossly unremarkable.  The thyroid gland is diminutive and not well assessed. No significant soft tissue abnormalities are seen. Minimal calcification is noted at the carotid bifurcations bilaterally.  IMPRESSION: 1. Acute subarachnoid hemorrhage tracking along the left temporal and occipital lobes. 2. Acute subdural hematoma overlying the right frontal and parietal lobes, measuring up to 8 mm in thickness, and overlying the left parietal lobe, measuring up to 6 mm in thickness. 3. Bilateral focal intraparenchymal contusions noted at the  vertex, measuring 1.4 cm on the right and 1.1 cm on the left, seen along the gyri. 4. Given mild underlying cortical volume loss, no significant mass effect or midline shift is currently seen. 5. No evidence of fracture or subluxation along the cervical spine. 6. Mucosal thickening within the right maxillary sinus and right sphenoid sinus. 7. Minimal calcification noted at the carotid bifurcations bilaterally.  Critical Value/emergent results were called by telephone at the time of interpretation on 04/01/2014 at 9:33 PM to Dr. Clayton Bibles, who verbally acknowledged these results.   Electronically Signed   By: Garald Balding M.D.   On: 04/01/2014 21:33   Ct Chest W Contrast  04/01/2014   CLINICAL DATA:  Trauma scan. fall syncope, low O2; SEIZURES FALL trauma scan , abdominal tenderness LUQ  EXAM: CT CHEST, ABDOMEN, AND PELVIS WITH CONTRAST  TECHNIQUE: Multidetector CT imaging of the chest, abdomen and pelvis was performed following the standard protocol during bolus administration of intravenous contrast.  CONTRAST:  125mL OMNIPAQUE IOHEXOL 300 MG/ML  SOLN  COMPARISON:  None.  FINDINGS: CT CHEST FINDINGS  The thoracic inlet is unremarkable.  No mediastinal nor hilar masses or adenopathy. There is no evidence of mediastinal hematoma. Atherosclerotic calcifications within the aortic arch and coronary vessels. No evidence of a thoracic aortic aneurysm nor dissection.  Scarring versus atelectasis within the dependent portions of the lungs. Minimal emphysematous changes within the lung apices. The lungs are otherwise clear. No evidence of pneumothorax.  CT ABDOMEN AND PELVIS FINDINGS  The liver, spleen, adrenals, pancreas are unremarkable. A nonobstructing 5 mm calculus left kidney. Bilateral benign cysts within the kidneys. Kidneys otherwise unremarkable.  Mild dilation of the infrarenal abdominal aorta measuring 3.3  cm in diameter. Aorta otherwise unremarkable. Atherosclerotic calcifications are appreciated within the  abdominal aorta mesenteric and iliac vessels.  No abdominal or pelvic free fluid, loculated fluid collections, masses, nor adenopathy. There is diffuse diverticulosis within the sigmoid colon. Bowel is otherwise negative. Appendix identified and unremarkable.  No evidence of pneumoperitoneum nor retroperitoneal hematoma.  No abdominal wall no inguinal hernia.  Prostate is prominent measuring 6 cm in diameter.  Nondisplaced transverse process fracture on the right involving L1. Nondisplaced posterior eleventh rib fracture on the left. Nondisplaced fracture lateral ninth rib on the left.  IMPRESSION: Nondisplaced fractures left transverse process L1, posterior an eleventh rib on the left, lateral ninth rib on the left.  No further evidence of posttraumatic chest abdominal or pelvic abnormalities. No evidence of obstructive or inflammatory abnormalities.  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  Diverticulosis.  Bilateral Bosniak type 1 renal cysts.  Nonobstructing calculus left kidney.   Electronically Signed   By: Margaree Mackintosh M.D.   On: 04/01/2014 21:38   Ct Cervical Spine Wo Contrast  04/01/2014   CLINICAL DATA:  Seizure and fall. Concern for head or cervical spine injury.  EXAM: CT HEAD WITHOUT CONTRAST  CT CERVICAL SPINE WITHOUT CONTRAST  TECHNIQUE: Multidetector CT imaging of the head and cervical spine was performed following the standard protocol without intravenous contrast. Multiplanar CT image reconstructions of the cervical spine were also generated.  COMPARISON:  None.  FINDINGS: CT HEAD FINDINGS  Acute subarachnoid hemorrhage is noted tracking along the left temporal and occipital lobes. There is also acute subdural hematoma overlying the right frontal and parietal lobes, measuring up to 8 mm in thickness, and overlying the left parietal lobe, measuring up to 6 mm in thickness. In addition, bilateral focal intraparenchymal contusions are noted at the vertex, measuring 1.4 cm on  the right and 1.1 cm on the left, seen along the gyri.  Given mild underlying cortical volume loss, no significant mass effect or midline shift is currently seen.  The posterior fossa, including the cerebellum, brainstem and fourth ventricle, is within normal limits. The third and lateral ventricles, and basal ganglia are unremarkable in appearance.  There is no evidence of fracture; visualized osseous structures are unremarkable in appearance. The visualized portions of the orbits are within normal limits. Mucosal thickening is noted within the right maxillary sinus and right sphenoid sinus. The remaining paranasal sinuses and mastoid air cells are well-aerated. No significant soft tissue abnormalities are seen.  CT CERVICAL SPINE FINDINGS  There is no evidence of fracture or subluxation. Vertebral bodies demonstrate normal height and alignment. Minimal multilevel disc space narrowing is noted along the cervical spine. Prevertebral soft tissues are within normal limits. The visualized neural foramina are grossly unremarkable.  The thyroid gland is diminutive and not well assessed. No significant soft tissue abnormalities are seen. Minimal calcification is noted at the carotid bifurcations bilaterally.  IMPRESSION: 1. Acute subarachnoid hemorrhage tracking along the left temporal and occipital lobes. 2. Acute subdural hematoma overlying the right frontal and parietal lobes, measuring up to 8 mm in thickness, and overlying the left parietal lobe, measuring up to 6 mm in thickness. 3. Bilateral focal intraparenchymal contusions noted at the vertex, measuring 1.4 cm on the right and 1.1 cm on the left, seen along the gyri. 4. Given mild underlying cortical volume loss, no significant mass effect or midline shift is currently seen. 5. No evidence of fracture or subluxation along the cervical spine. 6. Mucosal thickening within the  right maxillary sinus and right sphenoid sinus. 7. Minimal calcification noted at the  carotid bifurcations bilaterally.  Critical Value/emergent results were called by telephone at the time of interpretation on 04/01/2014 at 9:33 PM to Dr. Clayton Bibles, who verbally acknowledged these results.   Electronically Signed   By: Garald Balding M.D.   On: 04/01/2014 21:33   Ct Abdomen Pelvis W Contrast  04/01/2014   CLINICAL DATA:  Trauma scan. fall syncope, low O2; SEIZURES FALL trauma scan , abdominal tenderness LUQ  EXAM: CT CHEST, ABDOMEN, AND PELVIS WITH CONTRAST  TECHNIQUE: Multidetector CT imaging of the chest, abdomen and pelvis was performed following the standard protocol during bolus administration of intravenous contrast.  CONTRAST:  124mL OMNIPAQUE IOHEXOL 300 MG/ML  SOLN  COMPARISON:  None.  FINDINGS: CT CHEST FINDINGS  The thoracic inlet is unremarkable.  No mediastinal nor hilar masses or adenopathy. There is no evidence of mediastinal hematoma. Atherosclerotic calcifications within the aortic arch and coronary vessels. No evidence of a thoracic aortic aneurysm nor dissection.  Scarring versus atelectasis within the dependent portions of the lungs. Minimal emphysematous changes within the lung apices. The lungs are otherwise clear. No evidence of pneumothorax.  CT ABDOMEN AND PELVIS FINDINGS  The liver, spleen, adrenals, pancreas are unremarkable. A nonobstructing 5 mm calculus left kidney. Bilateral benign cysts within the kidneys. Kidneys otherwise unremarkable.  Mild dilation of the infrarenal abdominal aorta measuring 3.3 cm in diameter. Aorta otherwise unremarkable. Atherosclerotic calcifications are appreciated within the abdominal aorta mesenteric and iliac vessels.  No abdominal or pelvic free fluid, loculated fluid collections, masses, nor adenopathy. There is diffuse diverticulosis within the sigmoid colon. Bowel is otherwise negative. Appendix identified and unremarkable.  No evidence of pneumoperitoneum nor retroperitoneal hematoma.  No abdominal wall no inguinal hernia.   Prostate is prominent measuring 6 cm in diameter.  Nondisplaced transverse process fracture on the right involving L1. Nondisplaced posterior eleventh rib fracture on the left. Nondisplaced fracture lateral ninth rib on the left.  IMPRESSION: Nondisplaced fractures left transverse process L1, posterior an eleventh rib on the left, lateral ninth rib on the left.  No further evidence of posttraumatic chest abdominal or pelvic abnormalities. No evidence of obstructive or inflammatory abnormalities.  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  Diverticulosis.  Bilateral Bosniak type 1 renal cysts.  Nonobstructing calculus left kidney.   Electronically Signed   By: Margaree Mackintosh M.D.   On: 04/01/2014 21:38    EKG: Independently reviewed.   Time spent:   [address] [facility] Pager [phone]  If 7PM-7AM, please contact night-coverage [email or web address] Password [email or web address] 04/02/2014, 5:04 AM

## 2014-04-02 NOTE — Procedures (Signed)
Intubation Procedure Note FIELDS OROS [id number] 10-05-1946  Procedure: Intubation Indications: Airway protection and maintenance  Procedure Details Consent: Unable to obtain consent because of emergent medical necessity. Time Out: Verified patient identification, verified procedure, site/side was marked, verified correct patient position, special equipment/implants available, medications/allergies/relevent history reviewed, required imaging and test results available.  Performed  Maximum sterile technique was used including antiseptics, gloves, hand hygiene and mask.  MAC and 3    Evaluation Hemodynamic Status: BP stable throughout; O2 sats: stable throughout Patient's Current Condition: stable Complications: No apparent complications Patient did tolerate procedure well. Chest X-ray ordered to verify placement.  CXR: pending.   WHITEHEART,KATHRYN, NP 04/02/2014  I was present and supervised the entire procedure.  Rush Farmer, M.D. [facility] Pulmonary/Critical Care Medicine. Pager: [phone]. After hours pager: [phone].

## 2014-04-02 NOTE — Progress Notes (Signed)
CT head reviewed, demonstrates stable small right convexity SDH without mass effect. Small bilateral vertex contusions are stable. Superficial left T-O SAH is also stable. No HCP.  - No surgical intervention required for ICH. Cont to monitor neurologic exam - Cont Keppra - Wean vent as tolerated - Syncopal w/u

## 2014-04-02 NOTE — Progress Notes (Signed)
CRITICAL VALUE ALERT  Critical value received:  Troponin 0.74  Date of notification:  04/02/13  Time of notification:  14:14  Critical value read back: yes  Nurse who received alert:  Loreen Freud, RN  MD notified (1st page):  Nelda Marseille  Time of first page:  14:14  MD notified (2nd page):  Time of second page:  Responding MD:  Nelda Marseille  Time MD responded:  14:17

## 2014-04-02 NOTE — Progress Notes (Signed)
Orthopedic Tech Progress Note Patient Details:  Allen Myers 12-16-1945 [id number]  Patient ID: [name], male   DOB: 1945-11-21, 68 y.o.   MRN: [id number] rn reported that pt is currently on ventllator and will not require the aspen lumbar corsett at this time.   Hildred Priest 04/02/2014, 1:24 PM

## 2014-04-02 NOTE — Consult Note (Signed)
Reason for Consult: syncope   Referring Physician: Dr. Redmond Pulling PCP: Cathlean Cower, MD Primary Cardiologist:New Dr. Sterling Big Allen Myers is an 68 y.o. male.    Chief Complaint: Pt admitted last night 04/01/14 after syncope on his roof   HPI: 68 year old male was up on the roof of his deck 04/01/14 when he became disoriented. His wife witnessed him stumble around on the roof. He fell down, hitting his back on a broom. But did not fall off the roof. He had positive loss of consciousness. He was initially described as purple but began breathing again. He recalls both feeling disoriented and then regaining consciousness with EMS personnel attending to him. In ER he complained of back pain. He also noted he did fall down the night before getting up out of bed but attributed that to some leg cramps.  He was found in ER to have traumatic brain injury with subarachnoid hemorrhage and subdural, L1 transverse process fracture,   Left rib fracture #9 and #11.  Initial troponins negative. EKG on admit:  Sinus tachycardia, Borderline left axis deviation, Abnormal R-wave progression, early transition,Borderline prolonged QT interval.   When compared with ECG of 04/30/2001 QT has lengthened Otherwise no significant change.  Today he was neurally intact and was on way to CT scan to eval subarachnoid and subdural hemorrhage.  On the elevator he had grand mal seizure and then respiratory failure requiring intubation.  He remained in SR with seizure and maintained a pulse.  Now follow up troponin elevated at 0.74.   Beginning at 0600 pt with tachycardia and freq PVCs and NSVT.   Echo today: Left ventricle: The cavity size was mildly dilated. Wall thickness was increased in a pattern of mild LVH. Systolic function was moderately to severely reduced. The estimated ejection fraction was in the range of 30% to 35%. Diffuse hypokinesis. Doppler parameters are consistent with abnormal left ventricular  relaxation (grade 1 diastolic dysfunction). - Aortic root: The aortic root was mildly dilated. Impressions: - Severe global reduction in LV function (EF 30); grade 1 diastolic dysfunction.  Carotid dopplers 1-39% ICA stenosis, Vertebral flow is antegrade.   Currently sedated but with stimulation he did wake up and tried to reach ET tube to self extubate.     Past Medical History  Diagnosis Date  . ALLERGIC RHINITIS 05/07/2007  . DEPRESSION 05/07/2007  . DIABETES MELLITUS, TYPE II 05/07/2007  . HYPERLIPIDEMIA 05/07/2007  . HYPOTHYROIDISM 05/07/2007  . SLEEP APNEA, OBSTRUCTIVE 05/07/2007  . COLONIC POLYPS, HX OF 12/07/2007  . DIVERTICULITIS, HX OF 05/07/2007  . DIVERTICULOSIS, COLON 12/07/2007  . ERECTILE DYSFUNCTION 05/07/2007  . NEPHROLITHIASIS, HX OF 12/07/2007  . HYPERTENSION 05/16/2008    pt denies    Past Surgical History  Procedure Laterality Date  . Colonoscopy    . Polypectomy      Family History  Problem Relation Age of Onset  . Cancer Brother     prostate cancer  . Cancer Cousin     lung cancer  . Colon cancer Neg Hx   . Rectal cancer Neg Hx   . Stomach cancer Neg Hx   . Lung cancer Father     dad was a smoker  . Heart disease Father   . Heart disease Mother    Social History:  reports that he quit smoking about 30 years ago. His smoking use included Cigarettes. He has a 30 pack-year smoking history. He has never used  smokeless tobacco. He reports that he drinks about .6 ounces of alcohol per week. He reports that he does not use illicit drugs.  Allergies: No Known Allergies  Medications Prior to Admission  Medication Sig Dispense Refill  . aspirin 81 MG EC tablet Take 81 mg by mouth daily.        Marland Kitchen levothyroxine (SYNTHROID, LEVOTHROID) 175 MCG tablet Take 175 mcg by mouth daily before breakfast.      . linagliptin (TRADJENTA) 5 MG TABS tablet Take 1 tablet (5 mg total) by mouth daily.  30 tablet  1  . lisinopril (PRINIVIL,ZESTRIL) 5 MG tablet Take 5 mg by mouth  daily.      Marland Kitchen lovastatin (MEVACOR) 20 MG tablet Take 20 mg by mouth at bedtime.      . metFORMIN (GLUCOPHAGE) 1000 MG tablet Take 1,000 mg by mouth 2 (two) times daily with a meal.      . pioglitazone (ACTOS) 45 MG tablet Take 45 mg by mouth daily.        Results for orders placed during the hospital encounter of 04/01/14 (from the past 48 hour(s))  CBC WITH DIFFERENTIAL     Status: Abnormal   Collection Time    04/01/14  7:41 PM      Result Value Ref Range   WBC 13.9 (*) 4.0 - 10.5 K/uL   RBC 4.88  4.22 - 5.81 MIL/uL   Hemoglobin 13.7  13.0 - 17.0 g/dL   HCT 41.2  39.0 - 52.0 %   MCV 84.4  78.0 - 100.0 fL   MCH 28.1  26.0 - 34.0 pg   MCHC 33.3  30.0 - 36.0 g/dL   RDW 14.2  11.5 - 15.5 %   Platelets 161  150 - 400 K/uL   Neutrophils Relative % 85 (*) 43 - 77 %   Neutro Abs 11.8 (*) 1.7 - 7.7 K/uL   Lymphocytes Relative 7 (*) 12 - 46 %   Lymphs Abs 1.0  0.7 - 4.0 K/uL   Monocytes Relative 8  3 - 12 %   Monocytes Absolute 1.2 (*) 0.1 - 1.0 K/uL   Eosinophils Relative 0  0 - 5 %   Eosinophils Absolute 0.0  0.0 - 0.7 K/uL   Basophils Relative 0  0 - 1 %   Basophils Absolute 0.0  0.0 - 0.1 K/uL  COMPREHENSIVE METABOLIC PANEL     Status: Abnormal   Collection Time    04/01/14  7:41 PM      Result Value Ref Range   Sodium 135 (*) 137 - 147 mEq/L   Potassium 4.7  3.7 - 5.3 mEq/L   Chloride 98  96 - 112 mEq/L   CO2 21  19 - 32 mEq/L   Glucose, Bld 318 (*) 70 - 99 mg/dL   BUN 25 (*) 6 - 23 mg/dL   Creatinine, Ser 0.94  0.50 - 1.35 mg/dL   Calcium 9.1  8.4 - 10.5 mg/dL   Total Protein 7.0  6.0 - 8.3 g/dL   Albumin 3.7  3.5 - 5.2 g/dL   AST 26  0 - 37 U/L   ALT 19  0 - 53 U/L   Alkaline Phosphatase 96  39 - 117 U/L   Total Bilirubin 0.4  0.3 - 1.2 mg/dL   GFR calc non Af Amer 85 (*) >90 mL/min   GFR calc Af Amer >90  >90 mL/min   Comment: (NOTE)     The eGFR has been calculated  using the CKD EPI equation.     This calculation has not been validated in all clinical situations.       eGFR's persistently <90 mL/min signify possible Chronic Kidney     Disease.  PROLACTIN     Status: None   Collection Time    04/01/14  7:41 PM      Result Value Ref Range   Prolactin 8.9  2.1 - 17.1 ng/mL   Comment: (NOTE)         Reference Ranges:                     Male:                       2.1 -  17.1 ng/ml                     Male:   Pregnant          9.7 - 208.5 ng/mL                               Non Pregnant      2.8 -  29.2 ng/mL                               Post Menopausal   1.8 -  20.3 ng/mL                           Performed at [facility]  MRSA PCR SCREENING     Status: None   Collection Time    04/01/14 11:46 PM      Result Value Ref Range   MRSA by PCR NEGATIVE  NEGATIVE   Comment:            The GeneXpert MRSA Assay (FDA     approved for NASAL specimens     only), is one component of a     comprehensive MRSA colonization     surveillance program. It is not     intended to diagnose MRSA     infection nor to guide or     monitor treatment for     MRSA infections.  ETHANOL     Status: None   Collection Time    04/02/14 12:10 AM      Result Value Ref Range   Alcohol, Ethyl (B) <11  0 - 11 mg/dL   Comment:            LOWEST DETECTABLE LIMIT FOR     SERUM ALCOHOL IS 11 mg/dL     FOR MEDICAL PURPOSES ONLY  CBC     Status: Abnormal   Collection Time    04/02/14  2:45 AM      Result Value Ref Range   WBC 11.0 (*) 4.0 - 10.5 K/uL   RBC 4.67  4.22 - 5.81 MIL/uL   Hemoglobin 13.4  13.0 - 17.0 g/dL   HCT 39.8  39.0 - 52.0 %   MCV 85.2  78.0 - 100.0 fL   MCH 28.7  26.0 - 34.0 pg   MCHC 33.7  30.0 - 36.0 g/dL   RDW 14.5  11.5 - 15.5 %   Platelets 174  150 - 400 K/uL  BASIC METABOLIC PANEL     Status: Abnormal  Collection Time    04/02/14  2:45 AM      Result Value Ref Range   Sodium 136 (*) 137 - 147 mEq/L   Potassium 4.3  3.7 - 5.3 mEq/L   Chloride 100  96 - 112 mEq/L   CO2 23  19 - 32 mEq/L   Glucose, Bld 231 (*) 70 - 99 mg/dL   BUN  20  6 - 23 mg/dL   Creatinine, Ser 0.77  0.50 - 1.35 mg/dL   Calcium 8.6  8.4 - 10.5 mg/dL   GFR calc non Af Amer >90  >90 mL/min   GFR calc Af Amer >90  >90 mL/min   Comment: (NOTE)     The eGFR has been calculated using the CKD EPI equation.     This calculation has not been validated in all clinical situations.     eGFR's persistently <90 mL/min signify possible Chronic Kidney     Disease.  TROPONIN I     Status: None   Collection Time    04/02/14  6:32 AM      Result Value Ref Range   Troponin I <0.30  <0.30 ng/mL   Comment:            Due to the release kinetics of cTnI,     a negative result within the first hours     of the onset of symptoms does not rule out     myocardial infarction with certainty.     If myocardial infarction is still suspected,     repeat the test at appropriate intervals.  TSH     Status: Abnormal   Collection Time    04/02/14  6:32 AM      Result Value Ref Range   TSH 0.107 (*) 0.350 - 4.500 uIU/mL  GLUCOSE, CAPILLARY     Status: Abnormal   Collection Time    04/02/14  8:30 AM      Result Value Ref Range   Glucose-Capillary 244 (*) 70 - 99 mg/dL  PRO B NATRIURETIC PEPTIDE     Status: Abnormal   Collection Time    04/02/14  8:33 AM      Result Value Ref Range   Pro B Natriuretic peptide (BNP) 271.0 (*) 0 - 125 pg/mL  LACTIC ACID, PLASMA     Status: Abnormal   Collection Time    04/02/14  8:33 AM      Result Value Ref Range   Lactic Acid, Venous 11.2 (*) 0.5 - 2.2 mmol/L  MAGNESIUM     Status: None   Collection Time    04/02/14  8:33 AM      Result Value Ref Range   Magnesium 2.2  1.5 - 2.5 mg/dL  PHOSPHORUS     Status: None   Collection Time    04/02/14  8:33 AM      Result Value Ref Range   Phosphorus 3.8  2.3 - 4.6 mg/dL  TROPONIN I     Status: None   Collection Time    04/02/14  8:33 AM      Result Value Ref Range   Troponin I <0.30  <0.30 ng/mL   Comment:            Due to the release kinetics of cTnI,     a negative result  within the first hours     of the onset of symptoms does not rule out     myocardial infarction with certainty.  If myocardial infarction is still suspected,     repeat the test at appropriate intervals.  PROTIME-INR     Status: None   Collection Time    04/02/14  8:33 AM      Result Value Ref Range   Prothrombin Time 14.1  11.6 - 15.2 seconds   INR 1.11  0.00 - 1.49  PROCALCITONIN     Status: None   Collection Time    04/02/14  8:33 AM      Result Value Ref Range   Procalcitonin <0.10     Comment:            Interpretation:     PCT (Procalcitonin) <= 0.5 ng/mL:     Systemic infection (sepsis) is not likely.     Local bacterial infection is possible.     (NOTE)             ICU PCT Algorithm               Non ICU PCT Algorithm        ----------------------------     ------------------------------             PCT < 0.25 ng/mL                 PCT < 0.1 ng/mL         Stopping of antibiotics            Stopping of antibiotics           strongly encouraged.               strongly encouraged.        ----------------------------     ------------------------------           PCT level decrease by               PCT < 0.25 ng/mL           >= 80% from peak PCT           OR PCT 0.25 - 0.5 ng/mL          Stopping of antibiotics                                                 encouraged.         Stopping of antibiotics               encouraged.        ----------------------------     ------------------------------           PCT level decrease by              PCT >= 0.25 ng/mL           < 80% from peak PCT            AND PCT >= 0.5 ng/mL            Continuing antibiotics                                                  encouraged.           Continuing antibiotics  encouraged.        ----------------------------     ------------------------------         PCT level increase compared          PCT > 0.5 ng/mL             with peak PCT AND              PCT >= 0.5 ng/mL              Escalation of antibiotics                                              strongly encouraged.          Escalation of antibiotics            strongly encouraged.  TRIGLYCERIDES     Status: None   Collection Time    04/02/14  8:33 AM      Result Value Ref Range   Triglycerides 140  <150 mg/dL  URINALYSIS, ROUTINE W REFLEX MICROSCOPIC     Status: Abnormal   Collection Time    04/02/14  9:20 AM      Result Value Ref Range   Color, Urine YELLOW  YELLOW   APPearance CLEAR  CLEAR   Specific Gravity, Urine 1.031 (*) 1.005 - 1.030   pH 5.5  5.0 - 8.0   Glucose, UA >1000 (*) NEGATIVE mg/dL   Hgb urine dipstick SMALL (*) NEGATIVE   Bilirubin Urine NEGATIVE  NEGATIVE   Ketones, ur NEGATIVE  NEGATIVE mg/dL   Protein, ur 100 (*) NEGATIVE mg/dL   Urobilinogen, UA 0.2  0.0 - 1.0 mg/dL   Nitrite NEGATIVE  NEGATIVE   Leukocytes, UA NEGATIVE  NEGATIVE  URINE MICROSCOPIC-ADD ON     Status: None   Collection Time    04/02/14  9:20 AM      Result Value Ref Range   Squamous Epithelial / LPF RARE  RARE   WBC, UA 0-2  <3 WBC/hpf   RBC / HPF 3-6  <3 RBC/hpf   Bacteria, UA RARE  RARE  BLOOD GAS, ARTERIAL     Status: Abnormal   Collection Time    04/02/14  9:30 AM      Result Value Ref Range   FIO2 0.60     Delivery systems VENTILATOR     Mode PRESSURE REGULATED VOLUME CONTROL     VT 640     Rate 14     Peep/cpap 5.0     pH, Arterial 7.296 (*) 7.350 - 7.450   pCO2 arterial 38.6  35.0 - 45.0 mmHg   pO2, Arterial 69.6 (*) 80.0 - 100.0 mmHg   Bicarbonate 18.3 (*) 20.0 - 24.0 mEq/L   TCO2 19.4  0 - 100 mmol/L   Acid-base deficit 7.0 (*) 0.0 - 2.0 mmol/L   O2 Saturation 93.7     Patient temperature 98.6     Collection site LEFT RADIAL     Drawn by [id number]     Sample type ARTERIAL DRAW     Allens test (pass/fail) PASS  PASS  GLUCOSE, CAPILLARY     Status: Abnormal   Collection Time    04/02/14 11:58 AM      Result Value Ref Range   Glucose-Capillary 273 (*) 70 - 99 mg/dL  TROPONIN I  Status: Abnormal   Collection Time    04/02/14  1:31 PM      Result Value Ref Range   Troponin I 0.74 (*) <0.30 ng/mL   Comment:            Due to the release kinetics of cTnI,     a negative result within the first hours     of the onset of symptoms does not rule out     myocardial infarction with certainty.     If myocardial infarction is still suspected,     repeat the test at appropriate intervals.     CRITICAL RESULT CALLED TO, READ BACK BY AND VERIFIED WITH:     P.SUMMERALL,RN [id number] 04/02/14 M.CAMPBELL   Ct Head Without Contrast  04/02/2014   CLINICAL DATA:  Followup tube lead, patient suffered seizure and cardiac arrest on the way 2 CT department this morning, worrisome for worsening bleed  EXAM: CT HEAD WITHOUT CONTRAST  TECHNIQUE: Contiguous axial images were obtained from the base of the skull through the vertex without intravenous contrast.  COMPARISON:  04/01/2014  FINDINGS: Again identified is acute subarachnoid hemorrhage in the left temporal and occipital lobes. Acute subdural hematoma over the right frontal and parietal lobes is unchanged at about 6 mm. Posteriorly at the left vertex there is in unchanged acute subdural hematoma measuring 6 mm. A few foci of parenchymal contusions seen bilaterally at the vertex, measuring 13 mm on the right and 7 mm on the left, unchanged. No evidence of vascular territory infarct. Stable cortical atrophy. No hydrocephalus. No intraventricular hemorrhage.  IMPRESSION: Stable intracranial hemorrhage including left-sided temporal and occipital subarachnoid hemorrhage, bilateral subdural hematomas, and bilateral parenchymal contusions at the vertex. No significant mass effect or midline shift. No hydrocephalus.   Electronically Signed   By: Skipper Cliche M.D.   On: 04/02/2014 10:38   Ct Head Wo Contrast  04/01/2014   CLINICAL DATA:  Seizure and fall. Concern for head or cervical spine injury.  EXAM: CT HEAD WITHOUT CONTRAST  CT CERVICAL SPINE WITHOUT  CONTRAST  TECHNIQUE: Multidetector CT imaging of the head and cervical spine was performed following the standard protocol without intravenous contrast. Multiplanar CT image reconstructions of the cervical spine were also generated.  COMPARISON:  None.  FINDINGS: CT HEAD FINDINGS  Acute subarachnoid hemorrhage is noted tracking along the left temporal and occipital lobes. There is also acute subdural hematoma overlying the right frontal and parietal lobes, measuring up to 8 mm in thickness, and overlying the left parietal lobe, measuring up to 6 mm in thickness. In addition, bilateral focal intraparenchymal contusions are noted at the vertex, measuring 1.4 cm on the right and 1.1 cm on the left, seen along the gyri.  Given mild underlying cortical volume loss, no significant mass effect or midline shift is currently seen.  The posterior fossa, including the cerebellum, brainstem and fourth ventricle, is within normal limits. The third and lateral ventricles, and basal ganglia are unremarkable in appearance.  There is no evidence of fracture; visualized osseous structures are unremarkable in appearance. The visualized portions of the orbits are within normal limits. Mucosal thickening is noted within the right maxillary sinus and right sphenoid sinus. The remaining paranasal sinuses and mastoid air cells are well-aerated. No significant soft tissue abnormalities are seen.  CT CERVICAL SPINE FINDINGS  There is no evidence of fracture or subluxation. Vertebral bodies demonstrate normal height and alignment. Minimal multilevel disc space narrowing is noted along the cervical spine. Prevertebral  soft tissues are within normal limits. The visualized neural foramina are grossly unremarkable.  The thyroid gland is diminutive and not well assessed. No significant soft tissue abnormalities are seen. Minimal calcification is noted at the carotid bifurcations bilaterally.  IMPRESSION: 1. Acute subarachnoid hemorrhage tracking  along the left temporal and occipital lobes. 2. Acute subdural hematoma overlying the right frontal and parietal lobes, measuring up to 8 mm in thickness, and overlying the left parietal lobe, measuring up to 6 mm in thickness. 3. Bilateral focal intraparenchymal contusions noted at the vertex, measuring 1.4 cm on the right and 1.1 cm on the left, seen along the gyri. 4. Given mild underlying cortical volume loss, no significant mass effect or midline shift is currently seen. 5. No evidence of fracture or subluxation along the cervical spine. 6. Mucosal thickening within the right maxillary sinus and right sphenoid sinus. 7. Minimal calcification noted at the carotid bifurcations bilaterally.  Critical Value/emergent results were called by telephone at the time of interpretation on 04/01/2014 at 9:33 PM to Dr. Trixie Dredge, who verbally acknowledged these results.   Electronically Signed   By: Roanna Raider M.D.   On: 04/01/2014 21:33   Ct Chest W Contrast  04/01/2014   CLINICAL DATA:  Trauma scan. fall syncope, low O2; SEIZURES FALL trauma scan , abdominal tenderness LUQ  EXAM: CT CHEST, ABDOMEN, AND PELVIS WITH CONTRAST  TECHNIQUE: Multidetector CT imaging of the chest, abdomen and pelvis was performed following the standard protocol during bolus administration of intravenous contrast.  CONTRAST:  OMNIPAQUE IOHEXOL 300 MG/ML  SOLN  COMPARISON:  None.  FINDINGS: CT CHEST FINDINGS  The thoracic inlet is unremarkable.  No mediastinal nor hilar masses or adenopathy. There is no evidence of mediastinal hematoma. Atherosclerotic calcifications within the aortic arch and coronary vessels. No evidence of a thoracic aortic aneurysm nor dissection.  Scarring versus atelectasis within the dependent portions of the lungs. Minimal emphysematous changes within the lung apices. The lungs are otherwise clear. No evidence of pneumothorax.  CT ABDOMEN AND PELVIS FINDINGS  The liver, spleen, adrenals, pancreas are  unremarkable. A nonobstructing 5 mm calculus left kidney. Bilateral benign cysts within the kidneys. Kidneys otherwise unremarkable.  Mild dilation of the infrarenal abdominal aorta measuring 3.3 cm in diameter. Aorta otherwise unremarkable. Atherosclerotic calcifications are appreciated within the abdominal aorta mesenteric and iliac vessels.  No abdominal or pelvic free fluid, loculated fluid collections, masses, nor adenopathy. There is diffuse diverticulosis within the sigmoid colon. Bowel is otherwise negative. Appendix identified and unremarkable.  No evidence of pneumoperitoneum nor retroperitoneal hematoma.  No abdominal wall no inguinal hernia.  Prostate is prominent measuring 6 cm in diameter.  Nondisplaced transverse process fracture on the right involving L1. Nondisplaced posterior eleventh rib fracture on the left. Nondisplaced fracture lateral ninth rib on the left.  IMPRESSION: Nondisplaced fractures left transverse process L1, posterior an eleventh rib on the left, lateral ninth rib on the left.  No further evidence of posttraumatic chest abdominal or pelvic abnormalities. No evidence of obstructive or inflammatory abnormalities.  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  Diverticulosis.  Bilateral Bosniak type 1 renal cysts.  Nonobstructing calculus left kidney.   Electronically Signed   By: Salome Holmes M.D.   On: 04/01/2014 21:38   Ct Angio Chest Pe W/cm &/or Wo Cm  04/02/2014   CLINICAL DATA:  Intracranial hemorrhage, cardiac arrest, intubated, recent fall, suspect possible pulmonary embolism  EXAM: CT ANGIOGRAPHY CHEST WITH CONTRAST  TECHNIQUE: Multidetector  CT imaging of the chest was performed using the standard protocol during bolus administration of intravenous contrast. Multiplanar CT image reconstructions and MIPs were obtained to evaluate the vascular anatomy.  CONTRAST:  153mL OMNIPAQUE IOHEXOL 350 MG/ML SOLN  COMPARISON:  04/01/2014  FINDINGS: The patient has  been intubated with endotracheal tube tip above the carina.  There is extensive bilateral deep dependent consolidation involving the upper middle and lower lung zones consistent with significant dependent atelectasis. The aerated non dependent portions of the lungs are clear.  No significant hilar or mediastinal adenopathy. No pleural effusion.  There are no filling defects in the pulmonary arterial system.  Scans the upper abdomen are unremarkable.  Review of the MIP images confirms the above findings.  IMPRESSION: No evidence of pulmonary arterial embolism. There has developed significant dependent atelectasis bilaterally.   Electronically Signed   By: Skipper Cliche M.D.   On: 04/02/2014 10:52   Ct Cervical Spine Wo Contrast  04/01/2014   CLINICAL DATA:  Seizure and fall. Concern for head or cervical spine injury.  EXAM: CT HEAD WITHOUT CONTRAST  CT CERVICAL SPINE WITHOUT CONTRAST  TECHNIQUE: Multidetector CT imaging of the head and cervical spine was performed following the standard protocol without intravenous contrast. Multiplanar CT image reconstructions of the cervical spine were also generated.  COMPARISON:  None.  FINDINGS: CT HEAD FINDINGS  Acute subarachnoid hemorrhage is noted tracking along the left temporal and occipital lobes. There is also acute subdural hematoma overlying the right frontal and parietal lobes, measuring up to 8 mm in thickness, and overlying the left parietal lobe, measuring up to 6 mm in thickness. In addition, bilateral focal intraparenchymal contusions are noted at the vertex, measuring 1.4 cm on the right and 1.1 cm on the left, seen along the gyri.  Given mild underlying cortical volume loss, no significant mass effect or midline shift is currently seen.  The posterior fossa, including the cerebellum, brainstem and fourth ventricle, is within normal limits. The third and lateral ventricles, and basal ganglia are unremarkable in appearance.  There is no evidence of fracture;  visualized osseous structures are unremarkable in appearance. The visualized portions of the orbits are within normal limits. Mucosal thickening is noted within the right maxillary sinus and right sphenoid sinus. The remaining paranasal sinuses and mastoid air cells are well-aerated. No significant soft tissue abnormalities are seen.  CT CERVICAL SPINE FINDINGS  There is no evidence of fracture or subluxation. Vertebral bodies demonstrate normal height and alignment. Minimal multilevel disc space narrowing is noted along the cervical spine. Prevertebral soft tissues are within normal limits. The visualized neural foramina are grossly unremarkable.  The thyroid gland is diminutive and not well assessed. No significant soft tissue abnormalities are seen. Minimal calcification is noted at the carotid bifurcations bilaterally.  IMPRESSION: 1. Acute subarachnoid hemorrhage tracking along the left temporal and occipital lobes. 2. Acute subdural hematoma overlying the right frontal and parietal lobes, measuring up to 8 mm in thickness, and overlying the left parietal lobe, measuring up to 6 mm in thickness. 3. Bilateral focal intraparenchymal contusions noted at the vertex, measuring 1.4 cm on the right and 1.1 cm on the left, seen along the gyri. 4. Given mild underlying cortical volume loss, no significant mass effect or midline shift is currently seen. 5. No evidence of fracture or subluxation along the cervical spine. 6. Mucosal thickening within the right maxillary sinus and right sphenoid sinus. 7. Minimal calcification noted at the carotid bifurcations bilaterally.  Critical  Value/emergent results were called by telephone at the time of interpretation on 04/01/2014 at 9:33 PM to Dr. Clayton Bibles, who verbally acknowledged these results.   Electronically Signed   By: Garald Balding M.D.   On: 04/01/2014 21:33   Ct Abdomen Pelvis W Contrast  04/01/2014   CLINICAL DATA:  Trauma scan. fall syncope, low O2; SEIZURES FALL  trauma scan , abdominal tenderness LUQ  EXAM: CT CHEST, ABDOMEN, AND PELVIS WITH CONTRAST  TECHNIQUE: Multidetector CT imaging of the chest, abdomen and pelvis was performed following the standard protocol during bolus administration of intravenous contrast.  CONTRAST:  117mL OMNIPAQUE IOHEXOL 300 MG/ML  SOLN  COMPARISON:  None.  FINDINGS: CT CHEST FINDINGS  The thoracic inlet is unremarkable.  No mediastinal nor hilar masses or adenopathy. There is no evidence of mediastinal hematoma. Atherosclerotic calcifications within the aortic arch and coronary vessels. No evidence of a thoracic aortic aneurysm nor dissection.  Scarring versus atelectasis within the dependent portions of the lungs. Minimal emphysematous changes within the lung apices. The lungs are otherwise clear. No evidence of pneumothorax.  CT ABDOMEN AND PELVIS FINDINGS  The liver, spleen, adrenals, pancreas are unremarkable. A nonobstructing 5 mm calculus left kidney. Bilateral benign cysts within the kidneys. Kidneys otherwise unremarkable.  Mild dilation of the infrarenal abdominal aorta measuring 3.3 cm in diameter. Aorta otherwise unremarkable. Atherosclerotic calcifications are appreciated within the abdominal aorta mesenteric and iliac vessels.  No abdominal or pelvic free fluid, loculated fluid collections, masses, nor adenopathy. There is diffuse diverticulosis within the sigmoid colon. Bowel is otherwise negative. Appendix identified and unremarkable.  No evidence of pneumoperitoneum nor retroperitoneal hematoma.  No abdominal wall no inguinal hernia.  Prostate is prominent measuring 6 cm in diameter.  Nondisplaced transverse process fracture on the right involving L1. Nondisplaced posterior eleventh rib fracture on the left. Nondisplaced fracture lateral ninth rib on the left.  IMPRESSION: Nondisplaced fractures left transverse process L1, posterior an eleventh rib on the left, lateral ninth rib on the left.  No further evidence of  posttraumatic chest abdominal or pelvic abnormalities. No evidence of obstructive or inflammatory abnormalities.  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  Diverticulosis.  Bilateral Bosniak type 1 renal cysts.  Nonobstructing calculus left kidney.   Electronically Signed   By: Margaree Mackintosh M.D.   On: 04/01/2014 21:38   Dg Chest Port 1 View  04/02/2014   CLINICAL DATA:  Endotracheal tube placed  EXAM: PORTABLE CHEST - 1 VIEW  COMPARISON:  Chest CT from yesterday  FINDINGS: Endotracheal tube ends in the mid thoracic trachea.  Prominent cardiac size, accentuated by technique. No cardiomegaly noted on previous CT.  Lower lung volumes with streaky basilar opacities. No pulmonary edema. No pleural effusion or pneumothorax.  Known left-sided rib fractures are not well visualized.  IMPRESSION: 1. New endotracheal tube which is in good position. 2. New bibasilar atelectasis.   Electronically Signed   By: Jorje Guild M.D.   On: 04/02/2014 09:16   PER record--pt intubated-unable to answer questions ROS: General:no colds or fevers, no weight changes Skin:no rashes or ulcers HEENT:no blurred vision, no congestion CV:see HPI PUL:see HPI GI:no diarrhea constipation or melena, no indigestion GU:no hematuria, no dysuria MS:no joint pain, no claudication- back pain after fall Neuro:no syncope prior to admit, no lightheadedness- fall the night before due to leg cramps.-wife reported pt was disoriented then walked into wall and then fell. Endo:+ diabetes, no thyroid disease   Blood pressure 92/61, pulse 85,  temperature 97.8 F (36.6 C), temperature source Axillary, resp. rate 14, height $RemoveBe'6\' 1"'HmEbYABlo$  (1.854 m), weight 264 lb 1.8 oz (119.8 kg), SpO2 97.00%. PE: General:Pleasant affect, NAD. Resting, sedated before I awakened.   Skin:Warm and dry, brisk capillary refill HEENT:normocephalic, sclera clear, mucus membranes moist Neck:supple, no JVD, no bruits  Heart:S1S2 RRR without murmur, gallup,  rub or click Lungs:clear ant, without rales, rhonchi, or wheezes JSE:GBTD, non tender, +hypoactive, BS, do not palpate liver spleen or masses Ext:no lower ext edema, 2+ pedal pulses, 2+ radial pulses Neuro:alert and oriented, MAE, follows commands at times, + facial symmetry    Assessment/Plan Principal Problem:   Syncope-disoriented before fall Active Problems:   SAH (subarachnoid hemorrhage) on admit, neuro following   Acute respiratory failure-Intubated CCM following   Atrial fibrillation with RVR- pt did have tachycardia but p waves are present. He did have increased PVCs. No anticoagulation due to subdural.   HTN (hypertension)-controlled   Cardiomyopathy, to determine ischemic vs non ischemic-on echo today   Seizure   Fracture of L1 vertebra   Rib fracture, Lt, #9 & 11   OSA was to attempt Cpap   NSTEMI type most likely secondary to seizure.  But with decreased EF continue serial troponin.  Will recheck EKG- SR no changes from admit.   [place]  Nurse Practitioner Certified [place] Pager [phone] or after 5pm or weekends call [phone] 04/02/2014, 3:29 PM   History and all data above reviewed.  Patient examined.  I agree with the findings as above. The etiology of the primary event (LOC) is not clear.  There is no indication that this was a primary cardiac event such as arrhythmia.  He did have a witnessed seizure today.  Now he is found to have an elevated troponin without acute EKG changes.  However, is EF is reduced with global hypokinesis.   Talking to the family there is no history of CAD or recent cardiac complaints.  He has had no history of chest pain, palpitations, SOB.  The patient exam reveals COR:rrr  ,  Lungs: cLEAR  ,  Abd: Positive bowel sounds, no rebound no guarding, Ext No edema.  He is intubated and sedated.  .  All available labs, radiology testing, previous records reviewed. Agree with documented assessment and plan. Elevated  troponin:  I would suspect that this is a secondary and not a primary event.  We can continue to cycle enzymes.  He could have an ischemia work up with cath preferred but after he has recovered substantially from the acute neurologic events.  Cardiomyopathy:  Again, this could be a secondary rather than a primary event.  We will follow for med titration.  Currently BP is running low.   Jeneen Rinks Hoch[place]  4:13 PM  04/02/2014

## 2014-04-02 NOTE — Consult Note (Signed)
PULMONARY / CRITICAL CARE MEDICINE   Name: Allen Myers MRN: [id number] DOB: [birth year]    ADMISSION DATE:  04/01/2014 CONSULTATION DATE:  04/02/14  REFERRING MD :  Redmond Pulling PRIMARY SERVICE: Trauma  CHIEF COMPLAINT:  Seizure, resp failure   BRIEF PATIENT DESCRIPTION: 68yo male with hx OSA, DM, HTN presented 6/19 after a fall on his roof, ?r/t syncopal episode during which he sustained SDH, SAH and multiple rib fx and L1 transverse process fx.  Admitted for nsgy monitoring and syncope w/u.  En route to CT scan 6/20 had witnessed seizure and progressive respiratory failure and PCCM consulted.   SIGNIFICANT EVENTS / STUDIES:  CT head 6/19>>> acute L temporal and occipital SAH, acute SDH R frontal and parietal, bilat focal intraparenchymal contusions, no mass effect or midline shift CT chest 6/19>>>  CT abd/pelvis 6/19>>> CT head 6/20>>>  LINES / TUBES: ETT 6/20>>>  CULTURES:   ANTIBIOTICS:   HISTORY OF PRESENT ILLNESS:  68yo male with hx OSA, DM, HTN presented 6/19 after a fall on his roof, ?r/t syncopal episode during which he sustained SDH, SAH and multiple rib fx. Stated that he became disoriented, stumbling and fell down hitting his back with POS LOC.  Initially described as purple per family but was breathing spontaneously and regained consciousness on EMS arrival.  No intervention indicated per nsgy.  He was en route to CT for a f/u scan of his head when he had witnessed seizure, apnea and "code" was called.  He regained consciousness and did begin breathing spontaneously but had snoring respirations intermittently and remained confused/post ictal with concern for airway protection and was intubated.    PAST MEDICAL HISTORY :  Past Medical History  Diagnosis Date  . ALLERGIC RHINITIS 05/07/2007  . DEPRESSION 05/07/2007  . DIABETES MELLITUS, TYPE II 05/07/2007  . HYPERLIPIDEMIA 05/07/2007  . HYPOTHYROIDISM 05/07/2007  . SLEEP APNEA, OBSTRUCTIVE 05/07/2007  . COLONIC POLYPS, HX OF  12/07/2007  . DIVERTICULITIS, HX OF 05/07/2007  . DIVERTICULOSIS, COLON 12/07/2007  . ERECTILE DYSFUNCTION 05/07/2007  . NEPHROLITHIASIS, HX OF 12/07/2007  . HYPERTENSION 05/16/2008    pt denies   Past Surgical History  Procedure Laterality Date  . Colonoscopy    . Polypectomy     Prior to Admission medications   Medication Sig Start Date End Date Taking? Authorizing Provider  aspirin 81 MG EC tablet Take 81 mg by mouth daily.     Yes Historical Provider, MD  levothyroxine (SYNTHROID, LEVOTHROID) 175 MCG tablet Take 175 mcg by mouth daily before breakfast.   Yes Historical Provider, MD  linagliptin (TRADJENTA) 5 MG TABS tablet Take 1 tablet (5 mg total) by mouth daily. 10/01/13  Yes Biagio Borg, MD  lisinopril (PRINIVIL,ZESTRIL) 5 MG tablet Take 5 mg by mouth daily.   Yes Historical Provider, MD  lovastatin (MEVACOR) 20 MG tablet Take 20 mg by mouth at bedtime.   Yes Historical Provider, MD  metFORMIN (GLUCOPHAGE) 1000 MG tablet Take 1,000 mg by mouth 2 (two) times daily with a meal.   Yes Historical Provider, MD  pioglitazone (ACTOS) 45 MG tablet Take 45 mg by mouth daily.   Yes Historical Provider, MD   No Known Allergies  FAMILY HISTORY:  Family History  Problem Relation Age of Onset  . Cancer Brother     prostate cancer  . Cancer Cousin     lung cancer  . Colon cancer Neg Hx   . Rectal cancer Neg Hx   . Stomach cancer Neg Hx   .  Lung cancer Father     dad was a smoker  . Heart disease Mother    SOCIAL HISTORY:  reports that he quit smoking about 30 years ago. His smoking use included Cigarettes. He has a 30 pack-year smoking history. He has never used smokeless tobacco. He reports that he drinks about .6 ounces of alcohol per week. He reports that he does not use illicit drugs.  REVIEW OF SYSTEMS:  Unable, pt post ictal.  Obtained from records.   SUBJECTIVE:   VITAL SIGNS: Temp:  [97.6 F (36.4 C)-97.8 F (36.6 C)] 97.8 F (36.6 C) (06/20 0835) Pulse Rate:  [71-103]  103 (06/20 0845) Resp:  [11-21] 16 (06/20 0845) BP: (100-156)/(52-133) 122/61 mmHg (06/20 0845) SpO2:  [88 %-97 %] 95 % (06/20 0845) FiO2 (%):  [60 %] 60 % (06/20 0845) Weight:  [264 lb 1.8 oz (119.8 kg)] 264 lb 1.8 oz (119.8 kg) (06/19 2335) HEMODYNAMICS:   VENTILATOR SETTINGS: Vent Mode:  [-]  FiO2 (%):  [60 %] 60 % INTAKE / OUTPUT: Intake/Output     06/19 0701 - 06/20 0700 06/20 0701 - 06/21 0700   I.V. (mL/kg) 700 (5.8)    Total Intake(mL/kg) 700 (5.8)    Urine (mL/kg/hr) 1050    Total Output 1050     Net -350            PHYSICAL EXAMINATION:  General:  Chronically ill appearing male, NAD on vent Neuro:  Sedated on vent, prior to sedation was confused, difficult to arouse at times, agitated intermittently, pupils 54mm sluggish, post ictal HEENT:  Mm moist, no JVD, ETT Cardiovascular:  s1s2 rrr, tachy, frequent PVC Lungs:  resps even non labored on vent, few scattered rhonchi otherwise clear  Abdomen:  Soft, non distended, +bs Musculoskeletal:  Warm and dry, no edema  Skin:  Warm and dry, no edema, face dusky/ purple with good SpO2  LABS:  CBC  Recent Labs Lab 04/01/14 1941 04/02/14 0245  WBC 13.9* 11.0*  HGB 13.7 13.4  HCT 41.2 39.8  PLT 161 174   Coag's No results found for this basename: APTT, INR,  in the last 168 hours BMET  Recent Labs Lab 04/01/14 1941 04/02/14 0245  NA 135* 136*  K 4.7 4.3  CL 98 100  CO2 21 23  BUN 25* 20  CREATININE 0.94 0.77  GLUCOSE 318* 231*   Electrolytes  Recent Labs Lab 04/01/14 1941 04/02/14 0245  CALCIUM 9.1 8.6   Sepsis Markers No results found for this basename: LATICACIDVEN, PROCALCITON, O2SATVEN,  in the last 168 hours ABG No results found for this basename: PHART, PCO2ART, PO2ART,  in the last 168 hours Liver Enzymes  Recent Labs Lab 04/01/14 1941  AST 26  ALT 19  ALKPHOS 96  BILITOT 0.4  ALBUMIN 3.7   Cardiac Enzymes  Recent Labs Lab 04/02/14 0632  TROPONINI <0.30   Glucose  Recent  Labs Lab 04/02/14 0830  GLUCAP 244*    Imaging Ct Head Wo Contrast  04/01/2014   CLINICAL DATA:  Seizure and fall. Concern for head or cervical spine injury.  EXAM: CT HEAD WITHOUT CONTRAST  CT CERVICAL SPINE WITHOUT CONTRAST  TECHNIQUE: Multidetector CT imaging of the head and cervical spine was performed following the standard protocol without intravenous contrast. Multiplanar CT image reconstructions of the cervical spine were also generated.  COMPARISON:  None.  FINDINGS: CT HEAD FINDINGS  Acute subarachnoid hemorrhage is noted tracking along the left temporal and occipital lobes. There is also  acute subdural hematoma overlying the right frontal and parietal lobes, measuring up to 8 mm in thickness, and overlying the left parietal lobe, measuring up to 6 mm in thickness. In addition, bilateral focal intraparenchymal contusions are noted at the vertex, measuring 1.4 cm on the right and 1.1 cm on the left, seen along the gyri.  Given mild underlying cortical volume loss, no significant mass effect or midline shift is currently seen.  The posterior fossa, including the cerebellum, brainstem and fourth ventricle, is within normal limits. The third and lateral ventricles, and basal ganglia are unremarkable in appearance.  There is no evidence of fracture; visualized osseous structures are unremarkable in appearance. The visualized portions of the orbits are within normal limits. Mucosal thickening is noted within the right maxillary sinus and right sphenoid sinus. The remaining paranasal sinuses and mastoid air cells are well-aerated. No significant soft tissue abnormalities are seen.  CT CERVICAL SPINE FINDINGS  There is no evidence of fracture or subluxation. Vertebral bodies demonstrate normal height and alignment. Minimal multilevel disc space narrowing is noted along the cervical spine. Prevertebral soft tissues are within normal limits. The visualized neural foramina are grossly unremarkable.  The  thyroid gland is diminutive and not well assessed. No significant soft tissue abnormalities are seen. Minimal calcification is noted at the carotid bifurcations bilaterally.  IMPRESSION: 1. Acute subarachnoid hemorrhage tracking along the left temporal and occipital lobes. 2. Acute subdural hematoma overlying the right frontal and parietal lobes, measuring up to 8 mm in thickness, and overlying the left parietal lobe, measuring up to 6 mm in thickness. 3. Bilateral focal intraparenchymal contusions noted at the vertex, measuring 1.4 cm on the right and 1.1 cm on the left, seen along the gyri. 4. Given mild underlying cortical volume loss, no significant mass effect or midline shift is currently seen. 5. No evidence of fracture or subluxation along the cervical spine. 6. Mucosal thickening within the right maxillary sinus and right sphenoid sinus. 7. Minimal calcification noted at the carotid bifurcations bilaterally.  Critical Value/emergent results were called by telephone at the time of interpretation on 04/01/2014 at 9:33 PM to Dr. Clayton Bibles, who verbally acknowledged these results.   Electronically Signed   By: Garald Balding M.D.   On: 04/01/2014 21:33   Ct Chest W Contrast  04/01/2014   CLINICAL DATA:  Trauma scan. fall syncope, low O2; SEIZURES FALL trauma scan , abdominal tenderness LUQ  EXAM: CT CHEST, ABDOMEN, AND PELVIS WITH CONTRAST  TECHNIQUE: Multidetector CT imaging of the chest, abdomen and pelvis was performed following the standard protocol during bolus administration of intravenous contrast.  CONTRAST:  170mL OMNIPAQUE IOHEXOL 300 MG/ML  SOLN  COMPARISON:  None.  FINDINGS: CT CHEST FINDINGS  The thoracic inlet is unremarkable.  No mediastinal nor hilar masses or adenopathy. There is no evidence of mediastinal hematoma. Atherosclerotic calcifications within the aortic arch and coronary vessels. No evidence of a thoracic aortic aneurysm nor dissection.  Scarring versus atelectasis within the  dependent portions of the lungs. Minimal emphysematous changes within the lung apices. The lungs are otherwise clear. No evidence of pneumothorax.  CT ABDOMEN AND PELVIS FINDINGS  The liver, spleen, adrenals, pancreas are unremarkable. A nonobstructing 5 mm calculus left kidney. Bilateral benign cysts within the kidneys. Kidneys otherwise unremarkable.  Mild dilation of the infrarenal abdominal aorta measuring 3.3 cm in diameter. Aorta otherwise unremarkable. Atherosclerotic calcifications are appreciated within the abdominal aorta mesenteric and iliac vessels.  No abdominal or pelvic free fluid,  loculated fluid collections, masses, nor adenopathy. There is diffuse diverticulosis within the sigmoid colon. Bowel is otherwise negative. Appendix identified and unremarkable.  No evidence of pneumoperitoneum nor retroperitoneal hematoma.  No abdominal wall no inguinal hernia.  Prostate is prominent measuring 6 cm in diameter.  Nondisplaced transverse process fracture on the right involving L1. Nondisplaced posterior eleventh rib fracture on the left. Nondisplaced fracture lateral ninth rib on the left.  IMPRESSION: Nondisplaced fractures left transverse process L1, posterior an eleventh rib on the left, lateral ninth rib on the left.  No further evidence of posttraumatic chest abdominal or pelvic abnormalities. No evidence of obstructive or inflammatory abnormalities.  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  Diverticulosis.  Bilateral Bosniak type 1 renal cysts.  Nonobstructing calculus left kidney.   Electronically Signed   By: Margaree Mackintosh M.D.   On: 04/01/2014 21:38   Ct Cervical Spine Wo Contrast  04/01/2014   CLINICAL DATA:  Seizure and fall. Concern for head or cervical spine injury.  EXAM: CT HEAD WITHOUT CONTRAST  CT CERVICAL SPINE WITHOUT CONTRAST  TECHNIQUE: Multidetector CT imaging of the head and cervical spine was performed following the standard protocol without intravenous  contrast. Multiplanar CT image reconstructions of the cervical spine were also generated.  COMPARISON:  None.  FINDINGS: CT HEAD FINDINGS  Acute subarachnoid hemorrhage is noted tracking along the left temporal and occipital lobes. There is also acute subdural hematoma overlying the right frontal and parietal lobes, measuring up to 8 mm in thickness, and overlying the left parietal lobe, measuring up to 6 mm in thickness. In addition, bilateral focal intraparenchymal contusions are noted at the vertex, measuring 1.4 cm on the right and 1.1 cm on the left, seen along the gyri.  Given mild underlying cortical volume loss, no significant mass effect or midline shift is currently seen.  The posterior fossa, including the cerebellum, brainstem and fourth ventricle, is within normal limits. The third and lateral ventricles, and basal ganglia are unremarkable in appearance.  There is no evidence of fracture; visualized osseous structures are unremarkable in appearance. The visualized portions of the orbits are within normal limits. Mucosal thickening is noted within the right maxillary sinus and right sphenoid sinus. The remaining paranasal sinuses and mastoid air cells are well-aerated. No significant soft tissue abnormalities are seen.  CT CERVICAL SPINE FINDINGS  There is no evidence of fracture or subluxation. Vertebral bodies demonstrate normal height and alignment. Minimal multilevel disc space narrowing is noted along the cervical spine. Prevertebral soft tissues are within normal limits. The visualized neural foramina are grossly unremarkable.  The thyroid gland is diminutive and not well assessed. No significant soft tissue abnormalities are seen. Minimal calcification is noted at the carotid bifurcations bilaterally.  IMPRESSION: 1. Acute subarachnoid hemorrhage tracking along the left temporal and occipital lobes. 2. Acute subdural hematoma overlying the right frontal and parietal lobes, measuring up to 8 mm in  thickness, and overlying the left parietal lobe, measuring up to 6 mm in thickness. 3. Bilateral focal intraparenchymal contusions noted at the vertex, measuring 1.4 cm on the right and 1.1 cm on the left, seen along the gyri. 4. Given mild underlying cortical volume loss, no significant mass effect or midline shift is currently seen. 5. No evidence of fracture or subluxation along the cervical spine. 6. Mucosal thickening within the right maxillary sinus and right sphenoid sinus. 7. Minimal calcification noted at the carotid bifurcations bilaterally.  Critical Value/emergent results were called by telephone at  the time of interpretation on 04/01/2014 at 9:33 PM to Dr. Clayton Bibles, who verbally acknowledged these results.   Electronically Signed   By: Garald Balding M.D.   On: 04/01/2014 21:33   Ct Abdomen Pelvis W Contrast  04/01/2014   CLINICAL DATA:  Trauma scan. fall syncope, low O2; SEIZURES FALL trauma scan , abdominal tenderness LUQ  EXAM: CT CHEST, ABDOMEN, AND PELVIS WITH CONTRAST  TECHNIQUE: Multidetector CT imaging of the chest, abdomen and pelvis was performed following the standard protocol during bolus administration of intravenous contrast.  CONTRAST:  143mL OMNIPAQUE IOHEXOL 300 MG/ML  SOLN  COMPARISON:  None.  FINDINGS: CT CHEST FINDINGS  The thoracic inlet is unremarkable.  No mediastinal nor hilar masses or adenopathy. There is no evidence of mediastinal hematoma. Atherosclerotic calcifications within the aortic arch and coronary vessels. No evidence of a thoracic aortic aneurysm nor dissection.  Scarring versus atelectasis within the dependent portions of the lungs. Minimal emphysematous changes within the lung apices. The lungs are otherwise clear. No evidence of pneumothorax.  CT ABDOMEN AND PELVIS FINDINGS  The liver, spleen, adrenals, pancreas are unremarkable. A nonobstructing 5 mm calculus left kidney. Bilateral benign cysts within the kidneys. Kidneys otherwise unremarkable.  Mild  dilation of the infrarenal abdominal aorta measuring 3.3 cm in diameter. Aorta otherwise unremarkable. Atherosclerotic calcifications are appreciated within the abdominal aorta mesenteric and iliac vessels.  No abdominal or pelvic free fluid, loculated fluid collections, masses, nor adenopathy. There is diffuse diverticulosis within the sigmoid colon. Bowel is otherwise negative. Appendix identified and unremarkable.  No evidence of pneumoperitoneum nor retroperitoneal hematoma.  No abdominal wall no inguinal hernia.  Prostate is prominent measuring 6 cm in diameter.  Nondisplaced transverse process fracture on the right involving L1. Nondisplaced posterior eleventh rib fracture on the left. Nondisplaced fracture lateral ninth rib on the left.  IMPRESSION: Nondisplaced fractures left transverse process L1, posterior an eleventh rib on the left, lateral ninth rib on the left.  No further evidence of posttraumatic chest abdominal or pelvic abnormalities. No evidence of obstructive or inflammatory abnormalities.  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  Diverticulosis.  Bilateral Bosniak type 1 renal cysts.  Nonobstructing calculus left kidney.   Electronically Signed   By: Margaree Mackintosh M.D.   On: 04/01/2014 21:38     ASSESSMENT / PLAN:  PULMONARY Acute respiratory failure - in setting AMS r/t seizure +/- worsening TBI Rib Fx P:   Intubate  Vent support - 8cc/kg  F/u ABG F/u CXR ETT placement and in am  CTA chest r/o PE   Daily SBT  Will need aggressive pain control and pulm hygiene once extubated in setting rib fx   CARDIOVASCULAR HTN  Tachycardia  ?Syncope  P:  2D echo pending  Troponin q6  D/c lisinopril  PRN labetalol   RENAL Hypokalemia - mild  Hyponatremia  P:   F/u chem, mg, phos  D/c lisinopril  Watch SCr closely with contrast for CTA Gentle NS   GASTROINTESTINAL No active issue  P:   PPI  Consider TF if remains intubated 6/21  HEMATOLOGIC No  active issue  P:  SCD's for DVT proph  F/u cbc   INFECTIOUS No active s/s infection  P:   Check u/a  Monitor wbc and fever curve off abx    ENDOCRINE DM  Hypothyroid    P:   Cont synthroid  SSI  Check HgbA1c   NEUROLOGIC Presumed syncope  AMS, Seizure -- ?etiology of his  initial fall or result of worsening ICH TBI - SDH, SAH  P:   RASS goal: -1 No intervention per nsgy  F/u CT head  Will ask neuro to see  keppra load   Nickolas Madrid, NP 04/02/2014  8:54 AM Pager: [phone] or [phone]  Patient seen and examined, agree with above note.  Will intubate for airway protection, scans as ordered, will perform a CTA to r/o PE.  Neurology to be called for seizure control and will continue to follow.  I have personally obtained a history, examined the patient, evaluated laboratory and imaging results, formulated the assessment and plan and placed orders.  CRITICAL CARE: The patient is critically ill with multiple organ systems failure and requires high complexity decision making for assessment and support, frequent evaluation and titration of therapies, application of advanced monitoring technologies and extensive interpretation of multiple databases. Critical Care Time devoted to patient care services described in this note is 45 minutes.   *Care during the described time interval was provided by me and/or other providers on the critical care team. I have reviewed this patient's available data, including medical history, events of note, physical examination and test results as part of my evaluation.  Rush Farmer, M.D. [facility]/Critical Care Medicine. Pager: [phone]. After hours pager: [phone].

## 2014-04-02 NOTE — Progress Notes (Signed)
eLink Physician-Brief Progress Note Patient Name: Allen Myers DOB: 1946-03-22 MRN: [id number]  Date of Service  04/02/2014   HPI/Events of Note   Bedside eval by Dr Presley Raddle at [facility] and patient rewquest  Patient requesting extubation MD/RN bedside assessment: patient meets extubation criteria   eICU Interventions  Extubation order set with NPO sent   Intervention Category Major Interventions: Respiratory failure - evaluation and management  Hutson Luft 04/02/2014, 11:48 PM

## 2014-04-02 NOTE — Code Documentation (Signed)
CODE BLUE NOTE  Patient Name: Allen Myers   MRN: [id number]   Date of Birth/ Sex: 17-Jun-1946 , male      Admission Date: 04/01/2014  Attending Provider: Trauma Md, MD  Primary Diagnosis: <principal problem not specified>    Indication: Pt admitted last night after syncope with TBI, subdural and subarachnoid hemorrhages. Experienced generalized tonic-clonic activity in elevator en route to CT head this morning, followed by brief apneic period without loss of pulse. Code was called approximately 8:00am and ACLS protocol was underway at time of arrival. Pt was minimally responsive, post-ictal. He was intubated due to concern for airway maintenance.     Technical Description:  - CPR performance duration:  None  - Was defibrillation or cardioversion used? No   - Was external pacer placed? No  - Was patient intubated pre/post CPR? Yes    Medications Administered: Y = Yes; Blank = No Amiodarone    Atropine    Calcium    Epinephrine    Lidocaine    Magnesium    Norepinephrine    Phenylephrine    Sodium bicarbonate    Vasopressin      Post CPR evaluation:  - Final Status - Was patient successfully resuscitated ? Yes - What is current rhythm? Sinus tachycardia - What is current hemodynamic status? Stable   Miscellaneous Information:  - Labs sent, including: None  - Primary team notified?  Yes  - Family Notified? Yes  - Additional notes/ transfer status: Remain in Neuro ICU, syncopal work up and CT head to follow.      Vance Gather, MD  04/02/2014, 8:44 AM

## 2014-04-03 ENCOUNTER — Inpatient Hospital Stay (HOSPITAL_COMMUNITY): Payer: Managed Care, Other (non HMO)

## 2014-04-03 DIAGNOSIS — I428 Other cardiomyopathies: Secondary | ICD-10-CM | POA: Diagnosis not present

## 2014-04-03 DIAGNOSIS — G4733 Obstructive sleep apnea (adult) (pediatric): Secondary | ICD-10-CM

## 2014-04-03 DIAGNOSIS — E039 Hypothyroidism, unspecified: Secondary | ICD-10-CM

## 2014-04-03 LAB — PROCALCITONIN: Procalcitonin: 0.19 ng/mL

## 2014-04-03 LAB — BASIC METABOLIC PANEL
BUN: 14 mg/dL (ref 6–23)
CALCIUM: 8.6 mg/dL (ref 8.4–10.5)
CO2: 23 meq/L (ref 19–32)
Chloride: 101 mEq/L (ref 96–112)
Creatinine, Ser: 0.92 mg/dL (ref 0.50–1.35)
GFR calc Af Amer: >90 mL/min (ref >90)
GFR, EST NON AFRICAN AMERICAN: 85 mL/min — AB (ref >90)
Glucose, Bld: 201 mg/dL — ABNORMAL HIGH (ref 70–99)
Potassium: 4.5 mEq/L (ref 3.7–5.3)
Sodium: 138 mEq/L (ref 137–147)

## 2014-04-03 LAB — CBC
HCT: 38.7 % — ABNORMAL LOW (ref 39.0–52.0)
Hemoglobin: 12.6 g/dL — ABNORMAL LOW (ref 13.0–17.0)
MCH: 28.2 pg (ref 26.0–34.0)
MCHC: 32.6 g/dL (ref 30.0–36.0)
MCV: 86.6 fL (ref 78.0–100.0)
Platelets: 151 K/uL (ref 150–400)
RBC: 4.47 MIL/uL (ref 4.22–5.81)
RDW: 14.6 % (ref 11.5–15.5)
WBC: 9.7 K/uL (ref 4.0–10.5)

## 2014-04-03 LAB — GLUCOSE, CAPILLARY
GLUCOSE-CAPILLARY: 138 mg/dL — AB (ref 70–99)
GLUCOSE-CAPILLARY: 145 mg/dL — AB (ref 70–99)
GLUCOSE-CAPILLARY: 164 mg/dL — AB (ref 70–99)
Glucose-Capillary: 192 mg/dL — ABNORMAL HIGH (ref 70–99)
Glucose-Capillary: 190 mg/dL — ABNORMAL HIGH (ref 70–99)
Glucose-Capillary: 185 mg/dL — ABNORMAL HIGH (ref 70–99)
Glucose-Capillary: 146 mg/dL — ABNORMAL HIGH (ref 70–99)

## 2014-04-03 LAB — TROPONIN I
Troponin I: <0.3 ng/mL (ref <0.30)
Troponin I: <0.3 ng/mL (ref <0.30)

## 2014-04-03 MED ORDER — MORPHINE SULFATE 2 MG/ML IJ SOLN
2.0000 mg | INTRAMUSCULAR | Status: DC | PRN
  Administered 2014-04-03: 2 mg via INTRAVENOUS
  Filled 2014-04-03: qty 1

## 2014-04-03 MED ORDER — OXYCODONE HCL 5 MG PO TABS
5.0000 mg | ORAL_TABLET | ORAL | Status: DC | PRN
  Administered 2014-04-03: 10 mg via ORAL
  Administered 2014-04-03: 5 mg via ORAL
  Administered 2014-04-04: 10 mg via ORAL
  Administered 2014-04-04 (×2): 5 mg via ORAL
  Administered 2014-04-05 – 2014-04-06 (×3): 10 mg via ORAL
  Filled 2014-04-03 (×3): qty 2
  Filled 2014-04-03: qty 1
  Filled 2014-04-03 (×2): qty 2
  Filled 2014-04-03: qty 1
  Filled 2014-04-03: qty 2
  Filled 2014-04-03: qty 1

## 2014-04-03 NOTE — Procedures (Signed)
Extubation Procedure Note  Patient Details:   Name: Allen Myers DOB: [birth year] MRN: [id number]   Airway Documentation:  Airway 7.5 mm (Active)  Secured at (cm) 25 cm 04/02/2014  7:42 PM  Measured From Lips 04/02/2014  7:42 PM  [place] 04/02/2014  3:23 PM  Secured By [employer] 04/02/2014  7:42 PM  Tube Holder Repositioned Yes 04/02/2014  7:42 PM  Cuff Pressure (cm H2O) 22 cm H2O 04/02/2014  4:30 PM  Site Condition Dry 04/02/2014  3:23 PM    Evaluation  O2 sats: stable throughout Complications: No apparent complications Patient did tolerate procedure well. Bilateral Breath Sounds: Clear;Diminished Suctioning: Airway Yes PT placed on 55% venturi mask post extubation. Arlyce Harman Evette 04/03/2014, 12:10 AM

## 2014-04-03 NOTE — Progress Notes (Addendum)
PULMONARY / CRITICAL CARE MEDICINE   Name: Allen Myers MRN: [id number] DOB: [birth year]    ADMISSION DATE:  04/01/2014 CONSULTATION DATE:  04/02/14  REFERRING MD :  Redmond Pulling PRIMARY SERVICE: Trauma  CHIEF COMPLAINT:  Seizure, resp failure   BRIEF PATIENT DESCRIPTION: 68yo male with hx OSA, DM, HTN presented 6/19 after a fall on his roof, ?r/t syncopal episode during which he sustained SDH, SAH and multiple rib fx and L1 transverse process fx.  Admitted for nsgy monitoring and syncope w/u.  En route to CT scan 6/20 had witnessed seizure and progressive respiratory failure and PCCM consulted.   SIGNIFICANT EVENTS / STUDIES:  CT head 6/19>>> acute L temporal and occipital SAH, acute SDH R frontal and parietal, bilat focal intraparenchymal contusions, no mass effect or midline shift CT chest 6/19>>>  CT abd/pelvis 6/19>>> CT head 6/20>>>  LINES / TUBES: ETT 6/20>>>6/21  CULTURES: None  ANTIBIOTICS: None  SUBJECTIVE: Awake and alert, following command.  VITAL SIGNS: Temp:  [97.7 F (36.5 C)-99 F (37.2 C)] 97.7 F (36.5 C) (06/21 0817) Pulse Rate:  [70-102] 78 (06/21 0700) Resp:  [13-20] 15 (06/21 0700) BP: (92-152)/(54-102) 106/55 mmHg (06/21 0700) SpO2:  [92 %-99 %] 97 % (06/21 0700) FiO2 (%):  [55 %-60 %] 55 % (06/21 0100) HEMODYNAMICS:   VENTILATOR SETTINGS: Vent Mode:  [-] PRVC FiO2 (%):  [55 %-60 %] 55 % Set Rate:  [14 bmp] 14 bmp Vt Set:  [640 mL] 640 mL PEEP:  [5 cmH20] 5 cmH20 Plateau Pressure:  [16 cmH20-22 cmH20] 16 cmH20 INTAKE / OUTPUT: Intake/Output     06/20 0701 - 06/21 0700 06/21 0701 - 06/22 0700   I.V. (mL/kg) 1149.9 (9.6)    IV Piggyback 105    Total Intake(mL/kg) 1254.9 (10.5)    Urine (mL/kg/hr) 2130 (0.7)    Total Output 2130     Net -875.1            PHYSICAL EXAMINATION:  General:  Awake, alert and interactive Neuro: Awake and interactive, following all commands HEENT: [place]/AT, PERRL, EOM-I and MMM Cardiovascular: RRR, Nl S1/S2,  -M/R/G. Lungs: Decrease BS at the bases. Abdomen:  Soft, non distended, +bs Musculoskeletal:  Warm and dry, no edema  Skin:  Warm and dry, no edema, face dusky/ purple with good SpO2  LABS:  CBC  Recent Labs Lab 04/01/14 1941 04/02/14 0245 04/03/14 0200  WBC 13.9* 11.0* 9.7  HGB 13.7 13.4 12.6*  HCT 41.2 39.8 38.7*  PLT 161 174 151   Coag's  Recent Labs Lab 04/02/14 0833  INR 1.11   BMET  Recent Labs Lab 04/01/14 1941 04/02/14 0245 04/03/14 0200  NA 135* 136* 138  K 4.7 4.3 4.5  CL 98 100 101  CO2 21 23 23   BUN 25* 20 14  CREATININE 0.94 0.77 0.92  GLUCOSE 318* 231* 201*   Electrolytes  Recent Labs Lab 04/01/14 1941 04/02/14 0245 04/02/14 0833 04/03/14 0200  CALCIUM 9.1 8.6  --  8.6  MG  --   --  2.2  --   PHOS  --   --  3.8  --    Sepsis Markers  Recent Labs Lab 04/02/14 0833 04/03/14 0200  LATICACIDVEN 11.2*  --   PROCALCITON <0.10 0.19   ABG  Recent Labs Lab 04/02/14 0930  PHART 7.296*  PCO2ART 38.6  PO2ART 69.6*   Liver Enzymes  Recent Labs Lab 04/01/14 1941  AST 26  ALT 19  ALKPHOS 96  BILITOT 0.4  ALBUMIN 3.7   Cardiac Enzymes  Recent Labs Lab 04/02/14 0973 04/02/14 0833 04/02/14 1331 04/02/14 2205 04/03/14 0920  TROPONINI <0.30 <0.30 0.74* 1.51* <0.30  PROBNP  --  271.0*  --   --   --    Glucose  Recent Labs Lab 04/02/14 1158 04/02/14 1550 04/02/14 1943 04/02/14 2333 04/03/14 0405 04/03/14 0800  GLUCAP 273* 198* 117* 145* 192* 164*    Imaging Ct Head Without Contrast  04/02/2014   CLINICAL DATA:  Followup tube lead, patient suffered seizure and cardiac arrest on the way 2 CT department this morning, worrisome for worsening bleed  EXAM: CT HEAD WITHOUT CONTRAST  TECHNIQUE: Contiguous axial images were obtained from the base of the skull through the vertex without intravenous contrast.  COMPARISON:  04/01/2014  FINDINGS: Again identified is acute subarachnoid hemorrhage in the left temporal and  occipital lobes. Acute subdural hematoma over the right frontal and parietal lobes is unchanged at about 6 mm. Posteriorly at the left vertex there is in unchanged acute subdural hematoma measuring 6 mm. A few foci of parenchymal contusions seen bilaterally at the vertex, measuring 13 mm on the right and 7 mm on the left, unchanged. No evidence of vascular territory infarct. Stable cortical atrophy. No hydrocephalus. No intraventricular hemorrhage.  IMPRESSION: Stable intracranial hemorrhage including left-sided temporal and occipital subarachnoid hemorrhage, bilateral subdural hematomas, and bilateral parenchymal contusions at the vertex. No significant mass effect or midline shift. No hydrocephalus.   Electronically Signed   By: Skipper Cliche M.D.   On: 04/02/2014 10:38   Ct Head Wo Contrast  04/01/2014   CLINICAL DATA:  Seizure and fall. Concern for head or cervical spine injury.  EXAM: CT HEAD WITHOUT CONTRAST  CT CERVICAL SPINE WITHOUT CONTRAST  TECHNIQUE: Multidetector CT imaging of the head and cervical spine was performed following the standard protocol without intravenous contrast. Multiplanar CT image reconstructions of the cervical spine were also generated.  COMPARISON:  None.  FINDINGS: CT HEAD FINDINGS  Acute subarachnoid hemorrhage is noted tracking along the left temporal and occipital lobes. There is also acute subdural hematoma overlying the right frontal and parietal lobes, measuring up to 8 mm in thickness, and overlying the left parietal lobe, measuring up to 6 mm in thickness. In addition, bilateral focal intraparenchymal contusions are noted at the vertex, measuring 1.4 cm on the right and 1.1 cm on the left, seen along the gyri.  Given mild underlying cortical volume loss, no significant mass effect or midline shift is currently seen.  The posterior fossa, including the cerebellum, brainstem and fourth ventricle, is within normal limits. The third and lateral ventricles, and basal  ganglia are unremarkable in appearance.  There is no evidence of fracture; visualized osseous structures are unremarkable in appearance. The visualized portions of the orbits are within normal limits. Mucosal thickening is noted within the right maxillary sinus and right sphenoid sinus. The remaining paranasal sinuses and mastoid air cells are well-aerated. No significant soft tissue abnormalities are seen.  CT CERVICAL SPINE FINDINGS  There is no evidence of fracture or subluxation. Vertebral bodies demonstrate normal height and alignment. Minimal multilevel disc space narrowing is noted along the cervical spine. Prevertebral soft tissues are within normal limits. The visualized neural foramina are grossly unremarkable.  The thyroid gland is diminutive and not well assessed. No significant soft tissue abnormalities are seen. Minimal calcification is noted at the carotid bifurcations bilaterally.  IMPRESSION: 1. Acute subarachnoid hemorrhage tracking along the left temporal and occipital lobes.  2. Acute subdural hematoma overlying the right frontal and parietal lobes, measuring up to 8 mm in thickness, and overlying the left parietal lobe, measuring up to 6 mm in thickness. 3. Bilateral focal intraparenchymal contusions noted at the vertex, measuring 1.4 cm on the right and 1.1 cm on the left, seen along the gyri. 4. Given mild underlying cortical volume loss, no significant mass effect or midline shift is currently seen. 5. No evidence of fracture or subluxation along the cervical spine. 6. Mucosal thickening within the right maxillary sinus and right sphenoid sinus. 7. Minimal calcification noted at the carotid bifurcations bilaterally.  Critical Value/emergent results were called by telephone at the time of interpretation on 04/01/2014 at 9:33 PM to Dr. Clayton Bibles, who verbally acknowledged these results.   Electronically Signed   By: Garald Balding M.D.   On: 04/01/2014 21:33   Ct Chest W Contrast  04/01/2014    CLINICAL DATA:  Trauma scan. fall syncope, low O2; SEIZURES FALL trauma scan , abdominal tenderness LUQ  EXAM: CT CHEST, ABDOMEN, AND PELVIS WITH CONTRAST  TECHNIQUE: Multidetector CT imaging of the chest, abdomen and pelvis was performed following the standard protocol during bolus administration of intravenous contrast.  CONTRAST:  120mL OMNIPAQUE IOHEXOL 300 MG/ML  SOLN  COMPARISON:  None.  FINDINGS: CT CHEST FINDINGS  The thoracic inlet is unremarkable.  No mediastinal nor hilar masses or adenopathy. There is no evidence of mediastinal hematoma. Atherosclerotic calcifications within the aortic arch and coronary vessels. No evidence of a thoracic aortic aneurysm nor dissection.  Scarring versus atelectasis within the dependent portions of the lungs. Minimal emphysematous changes within the lung apices. The lungs are otherwise clear. No evidence of pneumothorax.  CT ABDOMEN AND PELVIS FINDINGS  The liver, spleen, adrenals, pancreas are unremarkable. A nonobstructing 5 mm calculus left kidney. Bilateral benign cysts within the kidneys. Kidneys otherwise unremarkable.  Mild dilation of the infrarenal abdominal aorta measuring 3.3 cm in diameter. Aorta otherwise unremarkable. Atherosclerotic calcifications are appreciated within the abdominal aorta mesenteric and iliac vessels.  No abdominal or pelvic free fluid, loculated fluid collections, masses, nor adenopathy. There is diffuse diverticulosis within the sigmoid colon. Bowel is otherwise negative. Appendix identified and unremarkable.  No evidence of pneumoperitoneum nor retroperitoneal hematoma.  No abdominal wall no inguinal hernia.  Prostate is prominent measuring 6 cm in diameter.  Nondisplaced transverse process fracture on the right involving L1. Nondisplaced posterior eleventh rib fracture on the left. Nondisplaced fracture lateral ninth rib on the left.  IMPRESSION: Nondisplaced fractures left transverse process L1, posterior an eleventh rib on the left,  lateral ninth rib on the left.  No further evidence of posttraumatic chest abdominal or pelvic abnormalities. No evidence of obstructive or inflammatory abnormalities.  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  Diverticulosis.  Bilateral Bosniak type 1 renal cysts.  Nonobstructing calculus left kidney.   Electronically Signed   By: Margaree Mackintosh M.D.   On: 04/01/2014 21:38   Ct Angio Chest Pe W/cm &/or Wo Cm  04/02/2014   CLINICAL DATA:  Intracranial hemorrhage, cardiac arrest, intubated, recent fall, suspect possible pulmonary embolism  EXAM: CT ANGIOGRAPHY CHEST WITH CONTRAST  TECHNIQUE: Multidetector CT imaging of the chest was performed using the standard protocol during bolus administration of intravenous contrast. Multiplanar CT image reconstructions and MIPs were obtained to evaluate the vascular anatomy.  CONTRAST:  171mL OMNIPAQUE IOHEXOL 350 MG/ML SOLN  COMPARISON:  04/01/2014  FINDINGS: The patient has been intubated with endotracheal tube  tip above the carina.  There is extensive bilateral deep dependent consolidation involving the upper middle and lower lung zones consistent with significant dependent atelectasis. The aerated non dependent portions of the lungs are clear.  No significant hilar or mediastinal adenopathy. No pleural effusion.  There are no filling defects in the pulmonary arterial system.  Scans the upper abdomen are unremarkable.  Review of the MIP images confirms the above findings.  IMPRESSION: No evidence of pulmonary arterial embolism. There has developed significant dependent atelectasis bilaterally.   Electronically Signed   By: Skipper Cliche M.D.   On: 04/02/2014 10:52   Ct Cervical Spine Wo Contrast  04/01/2014   CLINICAL DATA:  Seizure and fall. Concern for head or cervical spine injury.  EXAM: CT HEAD WITHOUT CONTRAST  CT CERVICAL SPINE WITHOUT CONTRAST  TECHNIQUE: Multidetector CT imaging of the head and cervical spine was performed following the  standard protocol without intravenous contrast. Multiplanar CT image reconstructions of the cervical spine were also generated.  COMPARISON:  None.  FINDINGS: CT HEAD FINDINGS  Acute subarachnoid hemorrhage is noted tracking along the left temporal and occipital lobes. There is also acute subdural hematoma overlying the right frontal and parietal lobes, measuring up to 8 mm in thickness, and overlying the left parietal lobe, measuring up to 6 mm in thickness. In addition, bilateral focal intraparenchymal contusions are noted at the vertex, measuring 1.4 cm on the right and 1.1 cm on the left, seen along the gyri.  Given mild underlying cortical volume loss, no significant mass effect or midline shift is currently seen.  The posterior fossa, including the cerebellum, brainstem and fourth ventricle, is within normal limits. The third and lateral ventricles, and basal ganglia are unremarkable in appearance.  There is no evidence of fracture; visualized osseous structures are unremarkable in appearance. The visualized portions of the orbits are within normal limits. Mucosal thickening is noted within the right maxillary sinus and right sphenoid sinus. The remaining paranasal sinuses and mastoid air cells are well-aerated. No significant soft tissue abnormalities are seen.  CT CERVICAL SPINE FINDINGS  There is no evidence of fracture or subluxation. Vertebral bodies demonstrate normal height and alignment. Minimal multilevel disc space narrowing is noted along the cervical spine. Prevertebral soft tissues are within normal limits. The visualized neural foramina are grossly unremarkable.  The thyroid gland is diminutive and not well assessed. No significant soft tissue abnormalities are seen. Minimal calcification is noted at the carotid bifurcations bilaterally.  IMPRESSION: 1. Acute subarachnoid hemorrhage tracking along the left temporal and occipital lobes. 2. Acute subdural hematoma overlying the right frontal and  parietal lobes, measuring up to 8 mm in thickness, and overlying the left parietal lobe, measuring up to 6 mm in thickness. 3. Bilateral focal intraparenchymal contusions noted at the vertex, measuring 1.4 cm on the right and 1.1 cm on the left, seen along the gyri. 4. Given mild underlying cortical volume loss, no significant mass effect or midline shift is currently seen. 5. No evidence of fracture or subluxation along the cervical spine. 6. Mucosal thickening within the right maxillary sinus and right sphenoid sinus. 7. Minimal calcification noted at the carotid bifurcations bilaterally.  Critical Value/emergent results were called by telephone at the time of interpretation on 04/01/2014 at 9:33 PM to Dr. Clayton Bibles, who verbally acknowledged these results.   Electronically Signed   By: Garald Balding M.D.   On: 04/01/2014 21:33   Ct Abdomen Pelvis W Contrast  04/01/2014   CLINICAL  DATA:  Trauma scan. fall syncope, low O2; SEIZURES FALL trauma scan , abdominal tenderness LUQ  EXAM: CT CHEST, ABDOMEN, AND PELVIS WITH CONTRAST  TECHNIQUE: Multidetector CT imaging of the chest, abdomen and pelvis was performed following the standard protocol during bolus administration of intravenous contrast.  CONTRAST:  161mL OMNIPAQUE IOHEXOL 300 MG/ML  SOLN  COMPARISON:  None.  FINDINGS: CT CHEST FINDINGS  The thoracic inlet is unremarkable.  No mediastinal nor hilar masses or adenopathy. There is no evidence of mediastinal hematoma. Atherosclerotic calcifications within the aortic arch and coronary vessels. No evidence of a thoracic aortic aneurysm nor dissection.  Scarring versus atelectasis within the dependent portions of the lungs. Minimal emphysematous changes within the lung apices. The lungs are otherwise clear. No evidence of pneumothorax.  CT ABDOMEN AND PELVIS FINDINGS  The liver, spleen, adrenals, pancreas are unremarkable. A nonobstructing 5 mm calculus left kidney. Bilateral benign cysts within the kidneys.  Kidneys otherwise unremarkable.  Mild dilation of the infrarenal abdominal aorta measuring 3.3 cm in diameter. Aorta otherwise unremarkable. Atherosclerotic calcifications are appreciated within the abdominal aorta mesenteric and iliac vessels.  No abdominal or pelvic free fluid, loculated fluid collections, masses, nor adenopathy. There is diffuse diverticulosis within the sigmoid colon. Bowel is otherwise negative. Appendix identified and unremarkable.  No evidence of pneumoperitoneum nor retroperitoneal hematoma.  No abdominal wall no inguinal hernia.  Prostate is prominent measuring 6 cm in diameter.  Nondisplaced transverse process fracture on the right involving L1. Nondisplaced posterior eleventh rib fracture on the left. Nondisplaced fracture lateral ninth rib on the left.  IMPRESSION: Nondisplaced fractures left transverse process L1, posterior an eleventh rib on the left, lateral ninth rib on the left.  No further evidence of posttraumatic chest abdominal or pelvic abnormalities. No evidence of obstructive or inflammatory abnormalities.  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  Diverticulosis.  Bilateral Bosniak type 1 renal cysts.  Nonobstructing calculus left kidney.   Electronically Signed   By: Margaree Mackintosh M.D.   On: 04/01/2014 21:38   Dg Chest Port 1 View  04/02/2014   CLINICAL DATA:  Endotracheal tube placed  EXAM: PORTABLE CHEST - 1 VIEW  COMPARISON:  Chest CT from yesterday  FINDINGS: Endotracheal tube ends in the mid thoracic trachea.  Prominent cardiac size, accentuated by technique. No cardiomegaly noted on previous CT.  Lower lung volumes with streaky basilar opacities. No pulmonary edema. No pleural effusion or pneumothorax.  Known left-sided rib fractures are not well visualized.  IMPRESSION: 1. New endotracheal tube which is in good position. 2. New bibasilar atelectasis.   Electronically Signed   By: Jorje Guild M.D.   On: 04/02/2014 09:16     ASSESSMENT  / PLAN:  PULMONARY Acute respiratory failure - in setting AMS r/t seizure +/- worsening TBI Rib Fx OSA history P:   - Extubated and doing well. - Encourage IS. - Titrate O2 down as able. - Recommend CPAP when able.  CARDIOVASCULAR HTN  Tachycardia  ?Syncope  P:  - F/U on 2D echo. - BP control as ordered.  RENAL Hypokalemia - mild  Hyponatremia  P:   - BMET in AM. - Replace electrolytes as indicated.  GASTROINTESTINAL No active issue  P:   - PPI  - Advance diet as tolerated.  HEMATOLOGIC No active issue  P:  - SCD's for DVT proph  - F/u cbc   INFECTIOUS No active s/s infection  P:   - Monitor wbc and fever curve off abx  ENDOCRINE DM  Hypothyroid    P:   - Cont synthroid  - SSI   NEUROLOGIC Presumed syncope  AMS, Seizure -- ?etiology of his initial fall or result of worsening ICH TBI - SDH, SAH  P:   - Per NS.  PCCM will sign off, please call back if needed.  I have personally obtained a history, examined the patient, evaluated laboratory and imaging results, formulated the assessment and plan and placed orders.  *Care during the described time interval was provided by me and/or other providers on the critical care team. I have reviewed this patient's available data, including medical history, events of note, physical examination and test results as part of my evaluation.  Rush Farmer, M.D. [facility] Pulmonary/Critical Care Medicine. Pager: [phone]. After hours pager: [phone].

## 2014-04-03 NOTE — Progress Notes (Addendum)
Patient ID: Allen Myers, male   DOB: 18-Apr-1946, 68 y.o.   MRN: [id number]    Subjective: Up in chair, Back pain, wants water  Objective: Vital signs in last 24 hours: Temp:  [97.7 F (36.5 C)-99 F (37.2 C)] 97.7 F (36.5 C) (06/21 0817) Pulse Rate:  [70-142] 78 (06/21 0700) Resp:  [13-26] 15 (06/21 0700) BP: (92-201)/(54-143) 106/55 mmHg (06/21 0700) SpO2:  [92 %-100 %] 97 % (06/21 0700) FiO2 (%):  [55 %-60 %] 55 % (06/21 0100)    Intake/Output from previous day: 06/20 0701 - 06/21 0700 In: 1254.9 [I.V.:1149.9; IV Piggyback:105] Out: 2130 [Urine:2130] Intake/Output this shift:    General appearance: alert and cooperative Resp: clear to auscultation bilaterally Chest wall: left sided chest wall tenderness Cardio: regular rate and rhythm GI: soft, NT Extremities: calves soft Neuro: alert and oriented, F/C well  Lab Results: CBC   Recent Labs  04/02/14 0245 04/03/14 0200  WBC 11.0* 9.7  HGB 13.4 12.6*  HCT 39.8 38.7*  PLT 174 151   BMET  Recent Labs  04/02/14 0245 04/03/14 0200  NA 136* 138  K 4.3 4.5  CL 100 101  CO2 23 23  GLUCOSE 231* 201*  BUN 20 14  CREATININE 0.77 0.92  CALCIUM 8.6 8.6   PT/INR  Recent Labs  04/02/14 0833  LABPROT 14.1  INR 1.11   ABG  Recent Labs  04/02/14 0930  PHART 7.296*  HCO3 18.3*    Anti-infectives: Anti-infectives   None      Assessment/Plan: Syncopal event with fall on roof (Ground level fall) - syncopal workup per medicine Traumatic brain injury with subarachnoid hemorrhage and subdural - Dr. Kathyrn Sheriff following, F/U CT stable yesterday L1 transverse process fracture - lumbar corset, add zanaflex Resp - stable after extubation overnight,  Left rib fracture #9 and #11 - pulm toilet Seizures - Keppra, workup per medicine Depressed TSH - synthroid decreased to 142mcg DM - SSI, holding metformin VTE - SCD's, pharm dvt proph on hold for TBI FEN - start cleas Dispo -- ICU I spoke with his  family  LOS: 2 days    Georganna Skeans, MD, MPH, FACS Trauma: [phone] General Surgery: [phone]  04/03/2014

## 2014-04-03 NOTE — Progress Notes (Signed)
SUBJECTIVE:  Extubated.  He denies any chest pain or SOB.  He denies any past history of this.    PHYSICAL EXAM Filed Vitals:   04/03/14 0600 04/03/14 0630 04/03/14 0700 04/03/14 0817  BP: 99/56 103/58 106/55   Pulse: 80 77 78   Temp:    97.7 F (36.5 C)  TempSrc:    Oral  Resp: 14 16 15    Height:      Weight:      SpO2: 97% 97% 97%    General:  No distress Lungs:  Basilar coarse crackles Heart:  RRR, no rub Abdomen:  Positive bowel sounds, no rebound no guarding Extremities:  Trace edema   LABS: Lab Results  Component Value Date   TROPONINI 1.51* 04/02/2014   Results for orders placed during the hospital encounter of 04/01/14 (from the past 24 hour(s))  URINALYSIS, ROUTINE W REFLEX MICROSCOPIC     Status: Abnormal   Collection Time    04/02/14  9:20 AM      Result Value Ref Range   Color, Urine YELLOW  YELLOW   APPearance CLEAR  CLEAR   Specific Gravity, Urine 1.031 (*) 1.005 - 1.030   pH 5.5  5.0 - 8.0   Glucose, UA >1000 (*) NEGATIVE mg/dL   Hgb urine dipstick SMALL (*) NEGATIVE   Bilirubin Urine NEGATIVE  NEGATIVE   Ketones, ur NEGATIVE  NEGATIVE mg/dL   Protein, ur 100 (*) NEGATIVE mg/dL   Urobilinogen, UA 0.2  0.0 - 1.0 mg/dL   Nitrite NEGATIVE  NEGATIVE   Leukocytes, UA NEGATIVE  NEGATIVE  URINE MICROSCOPIC-ADD ON     Status: None   Collection Time    04/02/14  9:20 AM      Result Value Ref Range   Squamous Epithelial / LPF RARE  RARE   WBC, UA 0-2  <3 WBC/hpf   RBC / HPF 3-6  <3 RBC/hpf   Bacteria, UA RARE  RARE  BLOOD GAS, ARTERIAL     Status: Abnormal   Collection Time    04/02/14  9:30 AM      Result Value Ref Range   FIO2 0.60     Delivery systems VENTILATOR     Mode PRESSURE REGULATED VOLUME CONTROL     VT 640     Rate 14     Peep/cpap 5.0     pH, Arterial 7.296 (*) 7.350 - 7.450   pCO2 arterial 38.6  35.0 - 45.0 mmHg   pO2, Arterial 69.6 (*) 80.0 - 100.0 mmHg   Bicarbonate 18.3 (*) 20.0 - 24.0 mEq/L   TCO2 19.4  0 - 100 mmol/L   Acid-base deficit 7.0 (*) 0.0 - 2.0 mmol/L   O2 Saturation 93.7     Patient temperature 98.6     Collection site LEFT RADIAL     Drawn by [id number]     Sample type ARTERIAL DRAW     Allens test (pass/fail) PASS  PASS  GLUCOSE, CAPILLARY     Status: Abnormal   Collection Time    04/02/14 11:58 AM      Result Value Ref Range   Glucose-Capillary 273 (*) 70 - 99 mg/dL  TROPONIN I     Status: Abnormal   Collection Time    04/02/14  1:31 PM      Result Value Ref Range   Troponin I 0.74 (*) <0.30 ng/mL  GLUCOSE, CAPILLARY     Status: Abnormal   Collection Time    04/02/14  3:50 PM      Result Value Ref Range   Glucose-Capillary 198 (*) 70 - 99 mg/dL  GLUCOSE, CAPILLARY     Status: Abnormal   Collection Time    04/02/14  7:43 PM      Result Value Ref Range   Glucose-Capillary 117 (*) 70 - 99 mg/dL  TROPONIN I     Status: Abnormal   Collection Time    04/02/14 10:05 PM      Result Value Ref Range   Troponin I 1.51 (*) <0.30 ng/mL  GLUCOSE, CAPILLARY     Status: Abnormal   Collection Time    04/02/14 11:33 PM      Result Value Ref Range   Glucose-Capillary 145 (*) 70 - 99 mg/dL  CBC     Status: Abnormal   Collection Time    04/03/14  2:00 AM      Result Value Ref Range   WBC 9.7  4.0 - 10.5 K/uL   RBC 4.47  4.22 - 5.81 MIL/uL   Hemoglobin 12.6 (*) 13.0 - 17.0 g/dL   HCT 38.7 (*) 39.0 - 52.0 %   MCV 86.6  78.0 - 100.0 fL   MCH 28.2  26.0 - 34.0 pg   MCHC 32.6  30.0 - 36.0 g/dL   RDW 14.6  11.5 - 15.5 %   Platelets 151  150 - 400 K/uL  BASIC METABOLIC PANEL     Status: Abnormal   Collection Time    04/03/14  2:00 AM      Result Value Ref Range   Sodium 138  137 - 147 mEq/L   Potassium 4.5  3.7 - 5.3 mEq/L   Chloride 101  96 - 112 mEq/L   CO2 23  19 - 32 mEq/L   Glucose, Bld 201 (*) 70 - 99 mg/dL   BUN 14  6 - 23 mg/dL   Creatinine, Ser 0.92  0.50 - 1.35 mg/dL   Calcium 8.6  8.4 - 10.5 mg/dL   GFR calc non Af Amer 85 (*) >90 mL/min   GFR calc Af Amer >90  >90 mL/min    PROCALCITONIN     Status: None   Collection Time    04/03/14  2:00 AM      Result Value Ref Range   Procalcitonin 0.19    GLUCOSE, CAPILLARY     Status: Abnormal   Collection Time    04/03/14  4:05 AM      Result Value Ref Range   Glucose-Capillary 192 (*) 70 - 99 mg/dL  GLUCOSE, CAPILLARY     Status: Abnormal   Collection Time    04/03/14  8:00 AM      Result Value Ref Range   Glucose-Capillary 164 (*) 70 - 99 mg/dL    Intake/Output Summary (Last 24 hours) at 04/03/14 0913 Last data filed at 04/03/14 0600  Gross per 24 hour  Intake 1254.87 ml  Output   1505 ml  Net -250.13 ml    TELEMETRY:    No arrhythmias.  04/03/2014   ASSESSMENT AND PLAN:  ELEVATED TROPONIN:  Probable demand ischemia.  Continue to trend enzymes.   Repeat EKG.      CARDIOMYOPATHY:   Eventual ischemia evaluation possibly as an outpatient.  Possibly stress related cardiomyopathy.  BP will not allow med titration.   Jeneen Rinks [facility] 04/03/2014 9:13 AM

## 2014-04-03 NOTE — Progress Notes (Signed)
Pt seen and examined. No issues overnight. Pt extubated yesterday, sitting in bedside chair. No c/o HA, visual changes, N/T/W. Has back pain.  EXAM: Temp:  [97.7 F (36.5 C)-99 F (37.2 C)] 97.7 F (36.5 C) (06/21 0817) Pulse Rate:  [70-134] 78 (06/21 0700) Resp:  [13-26] 15 (06/21 0700) BP: (92-201)/(54-143) 106/55 mmHg (06/21 0700) SpO2:  [92 %-100 %] 97 % (06/21 0700) FiO2 (%):  [55 %-60 %] 55 % (06/21 0100) Intake/Output     06/20 0701 - 06/21 0700 06/21 0701 - 06/22 0700   I.V. (mL/kg) 1149.9 (9.6)    IV Piggyback 105    Total Intake(mL/kg) 1254.9 (10.5)    Urine (mL/kg/hr) 2130 (0.7)    Total Output 2130     Net -875.1           Awake, alert, oriented CN grossly intact Good strength throughout  LABS: Lab Results  Component Value Date   CREATININE 0.92 04/03/2014   BUN 14 04/03/2014   NA 138 04/03/2014   K 4.5 04/03/2014   CL 101 04/03/2014   CO2 23 04/03/2014   Lab Results  Component Value Date   WBC 9.7 04/03/2014   HGB 12.6* 04/03/2014   HCT 38.7* 04/03/2014   MCV 86.6 04/03/2014   PLT 151 04/03/2014    IMPRESSION: - 68 y.o. male s/p syncopal episode and fall with ICH - Likely post-traumatic SZ yesterday - stable left L1 TP fracture - Neurologically stable  PLAN: - Cont Keppra for post-traumatic SZ - Syncopal w/u per medicine/cardiology - Lumbar corset when up for comfort - Stable for transfer from neurosurgical standpoint

## 2014-04-03 NOTE — ED Provider Notes (Signed)
Medical screening examination/treatment/procedure(s) were conducted as a shared visit with non-physician practitioner(s) and myself.  I personally evaluated the patient during the encounter. 68yo M, c/o syncopal episode while on a roof. Pt did not fall off roof. Pt's wife states pt was "acting drunk" before he fell down and "shook a little." EMS/Fire Dept took pt down off roof. Pt does not recall events, feels "tired." VSS, NAD, A&O, CTA, RRR, LUQ TTP, neuro non-focal. CT's with acute SAH, SDH, cerebral contusions, L1 fx, left ribs fx. Neurosurgery Dr. Ralene Ok has consulted in the ED. Trauma Dr Grandville Silos to admit and requests [facility] consult for medical issues.  [facility] Dr. Arnoldo Morale aware.      CRITICAL CARE Performed by: Allen Myers Total critical care time: 35 Critical care time was exclusive of separately billable procedures and treating other patients. Critical care was necessary to treat or prevent imminent or life-threatening deterioration. Critical care was time spent personally by me on the following activities: development of treatment plan with patient and/or surrogate as well as nursing, discussions with consultants, evaluation of patient's response to treatment, examination of patient, obtaining history from patient or surrogate, ordering and performing treatments and interventions, ordering and review of laboratory studies, ordering and review of radiographic studies, pulse oximetry and re-evaluation of patient's condition.    EKG Interpretation   Date/Time:  Friday April 01 2014 18:25:17 EDT Ventricular Rate:  106 PR Interval:  184 QRS Duration: 93 QT Interval:  375 QTC Calculation: 498 R Axis:   -21 Text Interpretation:  Sinus tachycardia Borderline left axis deviation  Abnormal R-wave progression, early transition Borderline prolonged QT  interval When compared with ECG of 04/30/2001 QT has lengthened Otherwise  no significant change Confirmed by [facility]  MD, KATHLEEN  ([id number]) on  04/01/2014 8:05:14 PM        Allen Feller, DO 04/03/14 1500

## 2014-04-04 ENCOUNTER — Inpatient Hospital Stay (HOSPITAL_COMMUNITY): Payer: Managed Care, Other (non HMO)

## 2014-04-04 DIAGNOSIS — E119 Type 2 diabetes mellitus without complications: Secondary | ICD-10-CM

## 2014-04-04 DIAGNOSIS — R55 Syncope and collapse: Secondary | ICD-10-CM | POA: Diagnosis not present

## 2014-04-04 DIAGNOSIS — E669 Obesity, unspecified: Secondary | ICD-10-CM | POA: Diagnosis present

## 2014-04-04 LAB — BASIC METABOLIC PANEL
BUN: 10 mg/dL (ref 6–23)
CALCIUM: 8.5 mg/dL (ref 8.4–10.5)
CO2: 25 mEq/L (ref 19–32)
CREATININE: 0.82 mg/dL (ref 0.50–1.35)
Chloride: 101 mEq/L (ref 96–112)
GFR, EST NON AFRICAN AMERICAN: 89 mL/min — AB (ref >90)
Glucose, Bld: 154 mg/dL — ABNORMAL HIGH (ref 70–99)
Potassium: 3.5 mEq/L — ABNORMAL LOW (ref 3.7–5.3)
Sodium: 139 mEq/L (ref 137–147)

## 2014-04-04 LAB — CBC
HCT: 36.3 % — ABNORMAL LOW (ref 39.0–52.0)
Hemoglobin: 12 g/dL — ABNORMAL LOW (ref 13.0–17.0)
MCH: 28.6 pg (ref 26.0–34.0)
MCHC: 33.1 g/dL (ref 30.0–36.0)
MCV: 86.4 fL (ref 78.0–100.0)
PLATELETS: 123 10*3/uL — AB (ref 150–400)
RBC: 4.2 MIL/uL — ABNORMAL LOW (ref 4.22–5.81)
RDW: 14.4 % (ref 11.5–15.5)
WBC: 6.2 10*3/uL (ref 4.0–10.5)

## 2014-04-04 LAB — GLUCOSE, CAPILLARY
GLUCOSE-CAPILLARY: 147 mg/dL — AB (ref 70–99)
GLUCOSE-CAPILLARY: 184 mg/dL — AB (ref 70–99)
Glucose-Capillary: 171 mg/dL — ABNORMAL HIGH (ref 70–99)
Glucose-Capillary: 191 mg/dL — ABNORMAL HIGH (ref 70–99)
Glucose-Capillary: 214 mg/dL — ABNORMAL HIGH (ref 70–99)

## 2014-04-04 LAB — PROCALCITONIN: Procalcitonin: 0.1 ng/mL

## 2014-04-04 MED ORDER — INSULIN ASPART 100 UNIT/ML ~~LOC~~ SOLN
0.0000 [IU] | Freq: Three times a day (TID) | SUBCUTANEOUS | Status: DC
  Administered 2014-04-04 – 2014-04-05 (×3): 3 [IU] via SUBCUTANEOUS
  Administered 2014-04-05: 5 [IU] via SUBCUTANEOUS
  Administered 2014-04-05: 3 [IU] via SUBCUTANEOUS
  Administered 2014-04-06 (×2): 5 [IU] via SUBCUTANEOUS

## 2014-04-04 MED ORDER — CARVEDILOL 3.125 MG PO TABS
3.1250 mg | ORAL_TABLET | Freq: Two times a day (BID) | ORAL | Status: DC
  Administered 2014-04-04 – 2014-04-05 (×2): 3.125 mg via ORAL
  Filled 2014-04-04 (×5): qty 1

## 2014-04-04 MED ORDER — SODIUM CHLORIDE 0.9 % IJ SOLN
3.0000 mL | Freq: Two times a day (BID) | INTRAMUSCULAR | Status: DC
Start: 2014-04-04 — End: 2014-04-06
  Administered 2014-04-04 – 2014-04-06 (×3): 3 mL via INTRAVENOUS

## 2014-04-04 MED ORDER — PANTOPRAZOLE SODIUM 40 MG PO TBEC
40.0000 mg | DELAYED_RELEASE_TABLET | Freq: Every day | ORAL | Status: DC
  Administered 2014-04-04 – 2014-04-06 (×3): 40 mg via ORAL
  Filled 2014-04-04 (×3): qty 1

## 2014-04-04 MED ORDER — LEVETIRACETAM 500 MG PO TABS
500.0000 mg | ORAL_TABLET | Freq: Two times a day (BID) | ORAL | Status: DC
  Administered 2014-04-04 – 2014-04-06 (×4): 500 mg via ORAL
  Filled 2014-04-04 (×7): qty 1

## 2014-04-04 MED ORDER — LISINOPRIL 2.5 MG PO TABS
2.5000 mg | ORAL_TABLET | Freq: Every day | ORAL | Status: DC
  Administered 2014-04-04 – 2014-04-05 (×2): 2.5 mg via ORAL
  Filled 2014-04-04 (×2): qty 1

## 2014-04-04 MED ORDER — ACETAMINOPHEN 325 MG PO TABS: 325.0000 mg | ORAL_TABLET | Freq: Four times a day (QID) | ORAL | Status: DC | PRN

## 2014-04-04 MED ORDER — SODIUM CHLORIDE 0.9 % IJ SOLN: 3.0000 mL | INTRAMUSCULAR | Status: DC | PRN

## 2014-04-04 NOTE — Procedures (Signed)
ELECTROENCEPHALOGRAM REPORT  Date of Study: 04/04/2014  Patient's Name: Allen Myers MRN: [id number] Date of Birth: 01/01/1946  Referring Provider: Dr. Dorian Pod  Clinical History: This is a 68 year old man s/p fall, ?r/t syncopal episode during which he sustained SDH, SAH and multiple rib fx and L1 transverse process fracture.  En route to CT scan 6/20 had witnessed seizure and progressive respiratory failure.   Medications: Keppra  Technical Summary: A multichannel digital EEG recording measured by the international 10-20 system with electrodes applied with paste and impedances below 5000 ohms performed as portable with EKG monitoring in an awake and asleep patient.  Hyperventilation and photic stimulation were not performed.  The digital EEG was referentially recorded, reformatted, and digitally filtered in a variety of bipolar and referential montages for optimal display.   Description: The patient is awake and asleep during the recording.  During maximal wakefulness, there is a symmetric, medium voltage 8.5 Hz posterior dominant rhythm that attenuates with eye opening. This is admixed with occasional bursts of diffuse 2-3 Hz delta slowing of the waking background with additional occasional focal 2-3 Hz delta slowing over the left temporal region.  During drowsiness and sleep, there is an increase in theta slowing of the background.  Vertex waves and symmetric sleep spindles were seen.  Hyperventilation and photic stimulation were not performed. There was a single sharp wave seen over the left temporal region.  There were no electrographic seizures seen.    EKG lead was unremarkable.  Impression: This awake and asleep EEG is abnormal due to the presence of: 1. Mild diffuse slowing of the waking background 2. Additional focal slowing over the left temporal region 3. Single sharp wave over the left temporal region  Clinical Correlation of the above findings indicates bilateral  cerebral dysfunction that is non-specific in etiology and can be seen with hypoxic/ischemic injury, toxic/metabolic encephalopathies, or medication effect.  There is additional focal cerebral dysfunction over the left temporal region with possible epileptogenic potential.     [name], M.D.

## 2014-04-04 NOTE — Clinical Social Work Note (Signed)
Clinical Social Work Department BRIEF PSYCHOSOCIAL ASSESSMENT 04/04/2014  Patient:  Allen Myers, Allen Myers     Account Number:  [id number]     Admit date:  04/01/2014  Clinical Social Worker:  Myles Lipps  Date/Time:  04/04/2014 11:45 AM  Referred by:  Physician  Date Referred:  04/04/2014 Referred for  Psychosocial assessment   Other Referral:   Interview type:  Patient Other interview type:   Patient wife and daughter at bedside    PSYCHOSOCIAL DATA Living Status:  FAMILY Admitted from facility:   Level of care:   Primary support name:  Goerge, Mohr  [phone] / [phone] Primary support relationship to patient:  SPOUSE Degree of support available:   Strong and available for assistance    CURRENT CONCERNS Current Concerns  None Noted   Other Concerns:    SOCIAL WORK ASSESSMENT / PLAN Clinical Social Worker met with patient and patient family to offer support and discuss patient needs at discharge. Patient states that he was up on the flat roof above his deck when he lost conciousness and fell.  Patient does not remember the details of the incident, however patient wife at bedside and able to clearly describe how patient fell. Patient wife states that she thought patient was intially joking and then realized it was something serious.  Patient currently lives at home with his wife and plans to return home with her at discharge.  Patient wife is a Education officer, museum and available for 24/7 support during the summer months.  No PT/OT follow up needed at this time.    Clinical Social Worker inquired about current substance use.  Patient states that there are no concerns regarding current drug and alcohol use.  SBIRT complete.  No resources provided at this time.  CSW signing off.  Please reconsult if further needs arise prior to discharge.   Assessment/plan status:  No Further Intervention Required Other assessment/ plan:   Information/referral to community resources:   Clinical  Social Worker offered patient resources, however patient and patient wife state that at this time there are no direct needs.    PATIENT'S/FAMILY'S RESPONSE TO PLAN OF CARE: Patient alert and oriented x3 sitting up in the chair finishing up with therapies.  Patient has a great deal of family support who plan to assist with patient needs at discharge.  Patient engaged but deferred pieces of the detail to patient wife.  Patient agreeable with return home and hopeful for very soon.  Patient verbalized his appreciation for CSW support and confirmed understanding of CSW role.

## 2014-04-04 NOTE — Progress Notes (Signed)
Very pleasant.  No further seizure activity.  Ambulated with corset, difficult because of the pain.  Maybe home tomorrow, or rehab, whichever is appropriate.  This patient has been seen and I agree with the findings and treatment plan.  Kathryne Eriksson. Dahlia Bailiff, MD, [place] [phone] (pager) [phone] (direct pager) Trauma Surgeon

## 2014-04-04 NOTE — Progress Notes (Signed)
Patient ID: Allen Myers, male   DOB: 05/08/1946, 68 y.o.   MRN: [id number]  LOS: 3 days   Subjective: Orthostatic per PT.  No headaches.  C/o chest wall pain, hesitant of using pain meds, discussed.  Tolerating a diet.  Voiding.    Objective: Vital signs in last 24 hours: Temp:  [97.5 F (36.4 C)-98.3 F (36.8 C)] 98.3 F (36.8 C) (06/22 0759) Pulse Rate:  [58-84] 69 (06/22 1000) Resp:  [9-20] 13 (06/22 1000) BP: (102-148)/(48-74) 137/67 mmHg (06/22 1000) SpO2:  [87 %-99 %] 93 % (06/22 1000)    Lab Results:  CBC  Recent Labs  04/03/14 0200 04/04/14 0300  WBC 9.7 6.2  HGB 12.6* 12.0*  HCT 38.7* 36.3*  PLT 151 123*   BMET  Recent Labs  04/03/14 0200 04/04/14 0300  NA 138 139  K 4.5 3.5*  CL 101 101  CO2 23 25  GLUCOSE 201* 154*  BUN 14 10  CREATININE 0.92 0.82  CALCIUM 8.6 8.5    Imaging: Dg Chest Portable 1 View  04/03/2014   CLINICAL DATA:  Extubation  EXAM: PORTABLE CHEST - 1 VIEW  COMPARISON:  04/02/2014  FINDINGS: Endotracheal tube has been removed. Slightly improved lung volume. Bibasilar atelectasis/ infiltrate is unchanged. Negative for edema.  IMPRESSION: Bibasilar atelectasis/infiltrate is unchanged. Endotracheal tube removed.   Electronically Signed   By: Franchot Gallo M.D.   On: 04/03/2014 10:23     PE: General appearance: alert, cooperative and no distress Eyes: conjunctivae/corneas clear. PERRL, EOM's intact. Fundi benign. Resp: clear to auscultation bilaterally Cardio: regular rate and rhythm, S1, S2 normal, no murmur, click, rub or gallop GI: soft, non-tender; bowel sounds normal; no masses,  no organomegaly Extremities: extremities normal, atraumatic, no cyanosis or edema Neurologic: Grossly normal    Patient Active Problem List   Diagnosis Date Noted  . Obesity (BMI 30.0-34.9) 04/04/2014  . Acute respiratory failure 04/02/2014  . Atrial fibrillation with RVR 04/02/2014  . HTN (hypertension) 04/02/2014  . Cardiomyopathy, to  determine ischemic vs non ischemic 04/02/2014  . Seizure 04/02/2014  . Fracture of L1 vertebra 04/02/2014  . Rib fracture, Lt, #9 & 11 04/02/2014  . Syncope 04/02/2014  . NSTEMI (non-ST elevated myocardial infarction), type 2 most likely secondary to seizure  04/02/2014  . SAH (subarachnoid hemorrhage) 04/01/2014  . Acute sinus infection 01/11/2014  . OSA (obstructive sleep apnea) 07/22/2013  . Adenomatous polyp 06/17/2012  . Back pain 12/22/2011  . Fatigue 02/19/2011  . Preventative health care 02/18/2011  . SHOULDER PAIN 07/14/2009  . PLANTAR FASCIITIS, BILATERAL 06/12/2009  . THUMB PAIN, BILATERAL 06/12/2009  . HYPERTENSION 05/16/2008  . DIVERTICULOSIS, COLON 12/07/2007  . COLONIC POLYPS, HX OF 12/07/2007  . NEPHROLITHIASIS, HX OF 12/07/2007  . HYPOTHYROIDISM 05/07/2007  . DIABETES MELLITUS, TYPE II 05/07/2007  . HYPERLIPIDEMIA 05/07/2007  . ERECTILE DYSFUNCTION 05/07/2007  . DEPRESSION 05/07/2007  . ALLERGIC RHINITIS 05/07/2007  . DIVERTICULITIS, HX OF 05/07/2007    Assessment/Plan:  Syncopal event with fall on roof (Ground level fall) - syncopal workup per medicine  Orthostatic hypotension-per cards, appreciate assistance  Traumatic brain injury with subarachnoid hemorrhage and subdural - Dr. Kathyrn Sheriff following, F/U CT stable  L1 transverse process fracture - lumbar corset, zanaflex  Resp - stable Left rib fracture #9 and #11 - pulm toilet(pulling1500), pain control. Seizures - Keppra, workup per neuro Depressed TSH - synthroid decreased to 120mcg  DM - SSI change to TID, holding metformin  VTE - SCD's, pharm dvt proph  on hold for TBI  FEN - tolerating diet Dispo --transfer to floor   Erby Pian, ANP-BC Pager: [place] Pager: [phone]   04/04/2014  11:31 AM

## 2014-04-04 NOTE — Progress Notes (Signed)
EEG completed; results pending.    

## 2014-04-04 NOTE — Progress Notes (Signed)
UR completed. Await therapy evals to know pt's needs for next level of care.   Sandi Mariscal, RN BSN [place] CCM Trauma/Neuro ICU Case Manager [phone]

## 2014-04-04 NOTE — Progress Notes (Addendum)
Subjective: No CP or SOB.   Objective: Vital signs in last 24 hours: Temp:  [97.5 F (36.4 C)-98.3 F (36.8 C)] 98.3 F (36.8 C) (06/22 0759) Pulse Rate:  [58-81] 79 (06/22 0800) Resp:  [9-20] 15 (06/22 0800) BP: (102-148)/(48-74) 140/74 mmHg (06/22 0800) SpO2:  [87 %-99 %] 92 % (06/22 0800)    Intake/Output from previous day: 06/21 0701 - 06/22 0700 In: 1740 [P.O.:480; I.V.:1050; IV Piggyback:210] Out: -  Intake/Output this shift: Total I/O In: 280 [P.O.:180; I.V.:100] Out: -   Medications Current Facility-Administered Medications  Medication Dose Route Frequency Provider Last Rate Last Dose  . 0.9 %  sodium chloride infusion   Intravenous Continuous Rush Farmer, MD 50 mL/hr at 04/04/14 0231 1,000 mL at 04/04/14 0231  . antiseptic oral rinse (BIOTENE) solution 15 mL  15 mL Mouth Rinse q12n4p Brand Males, MD      . chlorhexidine (PERIDEX) 0.12 % solution 15 mL  15 mL Mouth Rinse BID Brand Males, MD      . insulin aspart (novoLOG) injection 0-15 Units  0-15 Units Subcutaneous 6 times per day Marijean Heath, NP   3 Units at 04/04/14 0812  . labetalol (NORMODYNE,TRANDATE) injection 10 mg  10 mg Intravenous Q2H PRN Marijean Heath, NP   10 mg at 04/02/14 0916  . levETIRAcetam (KEPPRA) 500 mg in sodium chloride 0.9 % 100 mL IVPB  500 mg Intravenous Q12H Marijean Heath, NP   500 mg at 04/04/14 0920  . levothyroxine (SYNTHROID, LEVOTHROID) tablet 100 mcg  100 mcg Oral QAC breakfast Megan Dort, PA-C   100 mcg at 04/04/14 0801  . morphine 2 MG/ML injection 2-4 mg  2-4 mg Intravenous Q2H PRN Zenovia Jarred, MD   2 mg at 04/03/14 0931  . multivitamin liquid 5 mL  5 mL Per Tube Daily Heather Cornelison Pitts, RD      . oxyCODONE (Oxy IR/ROXICODONE) immediate release tablet 5-10 mg  5-10 mg Oral Q4H PRN Zenovia Jarred, MD   5 mg at 04/04/14 0801  . pantoprazole (PROTONIX) EC tablet 40 mg  40 mg Oral Daily Rande Lawman Rumbarger, RPH   40 mg at 04/04/14  0919    PE: General appearance: alert, cooperative and no distress Lungs: mild basilar crackles Heart: regular rate and rhythm, S1, S2 normal, no murmur, click, rub or gallop Extremities: No LEE Pulses: 2+ and symmetric Skin: Warm and dry Neurologic: Grossly normal  Lab Results:   Recent Labs  04/02/14 0245 04/03/14 0200 04/04/14 0300  WBC 11.0* 9.7 6.2  HGB 13.4 12.6* 12.0*  HCT 39.8 38.7* 36.3*  PLT 174 151 123*   BMET  Recent Labs  04/02/14 0245 04/03/14 0200 04/04/14 0300  NA 136* 138 139  K 4.3 4.5 3.5*  CL 100 101 101  CO2 23 23 25   GLUCOSE 231* 201* 154*  BUN 20 14 10   CREATININE 0.77 0.92 0.82  CALCIUM 8.6 8.6 8.5   PT/INR  Recent Labs  04/02/14 0833  LABPROT 14.1  INR 1.11   Cardiac Panel (last 3 results)  Recent Labs  04/03/14 0920 04/03/14 1336 04/03/14 1904  TROPONINI <0.30 <0.30 <0.30   Lipid Panel     Component Value Date/Time   CHOL 128 01/07/2014 0812   TRIG 140 04/02/2014 0833   HDL 35.40* 01/07/2014 0812   CHOLHDL 4 01/07/2014 0812   VLDL 34.4 01/07/2014 0812   LDLCALC 58 01/07/2014 0812    Assessment/Plan   68  year old male was up on the roof of his deck 04/01/14 when he became disoriented. His wife witnessed him stumble around on the roof. He fell down, hitting his back on a broom. But did not fall off the roof. He had positive loss of consciousness. He was initially described as purple but began breathing again. He recalls both feeling disoriented and then regaining consciousness with EMS personnel attending to him. In ER he complained of back pain. He also noted he did fall down the night before getting up out of bed but attributed that to some leg cramps. He was found in ER to have traumatic brain injury with subarachnoid hemorrhage and subdural, L1 transverse process fracture,   Left rib fracture #9 and #11. Initial troponins negative. EKG on admit: Sinus tachycardia, Borderline left axis deviation, Abnormal R-wave progression,  early transition,Borderline prolonged QT interval. When compared with ECG of 04/30/2001 QT has lengthened Otherwise no significant change.   Today he was neurally intact and was on way to CT scan to eval subarachnoid and subdural hemorrhage. On the elevator he had grand mal seizure and then respiratory failure requiring intubation. He remained in SR with seizure and maintained a pulse. Now follow up troponin elevated at 0.74. Beginning at 0600 pt with tachycardia and freq PVCs and NSVT.   Principal Problem:   Syncope Active Problems:     NSTEMI (non-ST elevated myocardial infarction), type 2 most likely secondary to seizure  Troponin peaked at 1.51 and sebsequent 3 were normal.  2D echo revealed EF of 30-35% Diffuse Hypokinesis.  Ischemic evaluation eventually.        Cardiomyopathy, ischemic vs non ischemic  See above.  Net fluids +0.5L.  We discussed weight monitoring and low sodium diet after discharge.     HTN (hypertension)    Stable.  No meds   HLD  LDL controlled on statin at home.      Obesity  OP nutrition referral.    SAH (subarachnoid hemorrhage)   Acute respiratory failure   Seizure   Fracture of L1 vertebra   Rib fracture, Lt, #9 & 11     LOS: 3 days    HAGER, BRYAN PA-C 04/04/2014 9:24 AM  As above, patient seen and examined; denies CP or dyspnea; no arrhythmia on telemetry and no evidence of atrial fibrillation. There was a mild increase in troponin but fu negative (? Lab error). LV function reduced on echo. Add lisinopril 2.5 mg daily and coreg 3.125 mg BID. No ASA or anticoagulation given ICH. Will plan ischemia eval when he recovers from traumatic fall (will need input from neurosurgery concerning when he could be anticoagulated if PCI required). I am also concerned about syncope in setting of reduced LV function (? Ventricular arrhythmia); will review with EP. Would continue telemetry for now. Kirk Ruths

## 2014-04-04 NOTE — Progress Notes (Signed)
NUTRITION   DOCUMENTATION CODES Per approved criteria  -Obesity Unspecified   INTERVENTION: Reinforced DM dietary guidelines. Recommend outpatient DM education.   NUTRITION DIAGNOSIS: Inadequate oral intake related to inability to eat as evidenced by NPO status; resolved.   Goal: Enteral nutrition to provide 60-70% of estimated calorie needs (22-25 kcals/kg ideal body weight) and 100% of estimated protein needs, based on ASPEN guidelines for permissive underfeeding in critically ill obese individuals; na.  Dietary compliance  Monitor:  PO intake, weight trend, labs    ASSESSMENT: Pt admitted after syncopal episode and fall on roof with TBI, subdural and subarachnoid hemorrhages, L1 transverse process fx, and left rib fractures.  Per pt he has had no recent weight changes and had a good appetite PTA.  Pt admits to poor blood sugar control at home. Pt declined education at this time and states that his wife is planning to get education from his cardiologist wife, who is a RD.  We did review avoiding sugar-sweetened beverages, eating 3 balanced meals per day. Pt seemed disinterested but did say wife would follow up.  Offered support while in-patient as needed.   Height: Ht Readings from Last 1 Encounters:  04/01/14 6\' 1"  (1.854 m)    Weight: Wt Readings from Last 1 Encounters:  04/01/14 264 lb 1.8 oz (119.8 kg)    BMI:  Body mass index is 34.85 kg/(m^2).  Estimated Nutritional Needs: Kcal: 4825-0037 Protein: 115-125 grams Fluid: > 2.3 L/day  Skin: no issues noted  Diet Order: Carb Control Meal Completion: 100%   Intake/Output Summary (Last 24 hours) at 04/04/14 1559 Last data filed at 04/04/14 1000  Gross per 24 hour  Intake   1765 ml  Output      0 ml  Net   1765 ml    Last BM: PTA   Labs:   Recent Labs Lab 04/02/14 0245 04/02/14 0833 04/03/14 0200 04/04/14 0300  NA 136*  --  138 139  K 4.3  --  4.5 3.5*  CL 100  --  101 101  CO2 23  --  23 25   BUN 20  --  14 10  CREATININE 0.77  --  0.92 0.82  CALCIUM 8.6  --  8.6 8.5  MG  --  2.2  --   --   PHOS  --  3.8  --   --   GLUCOSE 231*  --  201* 154*    CBG (last 3)   Recent Labs  04/04/14 0317 04/04/14 0759 04/04/14 1136  GLUCAP 147* 171* 191*   Lab Results  Component Value Date   HGBA1C 9.5* 04/02/2014   Scheduled Meds: . antiseptic oral rinse  15 mL Mouth Rinse q12n4p  . carvedilol  3.125 mg Oral BID WC  . chlorhexidine  15 mL Mouth Rinse BID  . insulin aspart  0-15 Units Subcutaneous TID WC  . levETIRAcetam  500 mg Oral BID  . levothyroxine  100 mcg Oral QAC breakfast  . lisinopril  2.5 mg Oral Daily  . multivitamin  5 mL Per Tube Daily  . pantoprazole  40 mg Oral Daily  . sodium chloride  3 mL Intravenous Q12H    Continuous Infusions:    Maylon Peppers RD, LDN, CNSC [phone] Pager [phone] After Hours Pager

## 2014-04-04 NOTE — Evaluation (Signed)
Occupational Therapy Evaluation Patient Details Name: Allen Myers MRN: [id number] DOB: 02-16-1946 Today's Date: 04/04/2014    History of Present Illness 68yo male with hx OSA, DM, HTN presented 6/19 after a fall on his roof, ?r/t syncopal episode during which he sustained SDH, SAH and multiple rib fx and L1 transverse process fx.   Clinical Impression    Pt is performing ADL at a supervision (in standing) and modified independent level (in sitting).  He is primarily limited by L side rib pain. Instructed in safety related to lightheadedness with positional changes related to ADL. No further OT needs.         Follow Up Recommendations  No OT follow up    Equipment Recommendations  None recommended by OT    Recommendations for Other Services       Precautions / Restrictions Precautions Precautions: Fall      Mobility Bed Mobility               General bed mobility comments: received up in chair  Transfers Overall transfer level: Needs assistance Equipment used: None Transfers: Sit to/from Stand Sit to Stand: Supervision         General transfer comment: VCs for allowing time to adjust upon standing for positional changes    Balance                                            ADL Overall ADL's :  (supervision for standing ADL, set up for sitting)                                       General ADL Comments: slow, but able to perform ADL without physical assist.       Vision                     Perception     Praxis      Pertinent Vitals/Pain L rib pain, did not rate, repositioned and iced     Hand Dominance Right   Extremity/Trunk Assessment Upper Extremity Assessment Upper Extremity Assessment: Overall WFL for tasks assessed   Lower Extremity Assessment Lower Extremity Assessment: Overall WFL for tasks assessed   Cervical / Trunk Assessment Cervical / Trunk Assessment: Normal   Communication  Communication Communication: No difficulties   Cognition Arousal/Alertness: Awake/alert   Overall Cognitive Status: Within Functional Limits for tasks assessed                     General Comments       Exercises       Shoulder Instructions      Home Living Family/patient expects to be discharged to:: Private residence Living Arrangements: Spouse/significant other Available Help at Discharge: Family Type of Home: House Home Access: Stairs to enter Technical brewer of Steps: 2 Entrance Stairs-Rails: None Home Layout: Two level;Bed/bath upstairs Alternate Level Stairs-Number of Steps: 12 Alternate Level Stairs-Rails: Right;Left           Home Equipment: None          Prior Functioning/Environment Level of Independence: Independent        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, sells Social worker Diagnosis:     OT Problem List:  OT Treatment/Interventions:      OT Goals(Current goals can be found in the care plan section) Acute Rehab OT Goals Patient Stated Goal: did not state  OT Frequency:     Barriers to D/C:            Co-evaluation PT/OT/SLP Co-Evaluation/Treatment: Yes Reason for Co-Treatment: For patient/therapist safety   OT goals addressed during session: ADL's and self-care      End of Session Nurse Communication: Mobility status (orthostatics in PT note)  Activity Tolerance: Patient tolerated treatment well Patient left: in chair;with call bell/phone within reach;with family/visitor present   Time: 1040-1107 OT Time Calculation (min): 27 min Charges:  OT General Charges $OT Visit: 1 Procedure OT Evaluation $Initial OT Evaluation Tier I: 1 Procedure OT Treatments $Self Care/Home Management : 8-22 mins G-Codes:    Allen Myers 04/04/2014, 11:37 AM [phone]

## 2014-04-04 NOTE — Progress Notes (Signed)
Pt seen and examined. No issues overnight. Denies HA, visual changes, or weakness.  EXAM: Temp:  [97.5 F (36.4 C)-98.3 F (36.8 C)] 98.3 F (36.8 C) (06/22 0759) Pulse Rate:  [58-84] 69 (06/22 1000) Resp:  [9-20] 13 (06/22 1000) BP: (102-148)/(50-74) 130/71 mmHg (06/22 1101) SpO2:  [87 %-99 %] 93 % (06/22 1000) Intake/Output     06/21 0701 - 06/22 0700 06/22 0701 - 06/23 0700   P.O. 480 180   I.V. (mL/kg) 1050 (8.8) 190 (1.6)   IV Piggyback 210 105   Total Intake(mL/kg) 1740 (14.5) 475 (4)   Urine (mL/kg/hr)     Total Output       Net +1740 +475         Awake, alert, oriented CN grossly intact Good strength throughout  LABS: Lab Results  Component Value Date   CREATININE 0.82 04/04/2014   BUN 10 04/04/2014   NA 139 04/04/2014   K 3.5* 04/04/2014   CL 101 04/04/2014   CO2 25 04/04/2014   Lab Results  Component Value Date   WBC 6.2 04/04/2014   HGB 12.0* 04/04/2014   HCT 36.3* 04/04/2014   MCV 86.4 04/04/2014   PLT 123* 04/04/2014    IMPRESSION: - 68 y.o. male s/p syncopal episode, fall with ICH and left L1 TP fx - Neurologically intact  PLAN: - Lumbar corset when up - Mgmt per trauma - Can f/u in my office 4-6 weeks after discharge [facility] [place], [phone])

## 2014-04-04 NOTE — Progress Notes (Signed)
NEURO HOSPITALIST PROGRESS NOTE   SUBJECTIVE:                                                                                                                        Resting comfortably in a chair. Michela Pitcher that he is feeling very well. No further seizures noted. On keppra. EEG is unremarkable.  OBJECTIVE:                                                                                                                           Vital signs in last 24 hours: Temp:  [97.5 F (36.4 C)-98.8 F (37.1 C)] 98.8 F (37.1 C) (06/22 1328) Pulse Rate:  [58-89] 89 (06/22 1328) Resp:  [9-23] 20 (06/22 1328) BP: (102-148)/(53-74) 119/61 mmHg (06/22 1328) SpO2:  [87 %-99 %] 90 % (06/22 1328)  Intake/Output from previous day: 06/21 0701 - 06/22 0700 In: 1740 [P.O.:480; I.V.:1050; IV Piggyback:210] Out: -  Intake/Output this shift: Total I/O In: 475 [P.O.:180; I.V.:190; IV Piggyback:105] Out: -  Nutritional status: Carb Control  Past Medical History  Diagnosis Date  . ALLERGIC RHINITIS 05/07/2007  . DEPRESSION 05/07/2007  . DIABETES MELLITUS, TYPE II 05/07/2007  . HYPERLIPIDEMIA 05/07/2007  . HYPOTHYROIDISM 05/07/2007  . SLEEP APNEA, OBSTRUCTIVE 05/07/2007  . COLONIC POLYPS, HX OF 12/07/2007  . DIVERTICULITIS, HX OF 05/07/2007  . DIVERTICULOSIS, COLON 12/07/2007  . ERECTILE DYSFUNCTION 05/07/2007  . NEPHROLITHIASIS, HX OF 12/07/2007  . HYPERTENSION 05/16/2008    pt denies    Neurologic Exam:  Mental Status: Alert, oriented, thought content appropriate.  Speech fluent without evidence of aphasia.  Able to follow 3 step commands without difficulty. Cranial Nerves: II: Discs flat bilaterally; Visual fields grossly normal, pupils equal, round, reactive to light and accommodation III,IV, VI: ptosis not present, extra-ocular motions intact bilaterally V,VII: smile symmetric, facial light touch sensation normal bilaterally VIII: hearing normal bilaterally IX,X: gag  reflex present XI: bilateral shoulder shrug XII: midline tongue extension without atrophy or fasciculations Motor: Right : Upper extremity   5/5    Left:     Upper extremity   5/5  Lower extremity   5/5     Lower extremity   5/5 Tone and bulk:normal tone throughout; no atrophy noted Sensory:  Pinprick and light touch intact throughout, bilaterally Deep Tendon Reflexes:  Right: Upper Extremity   Left: Upper extremity   biceps (C-5 to C-6) 2/4   biceps (C-5 to C-6) 2/4 tricep (C7) 2/4    triceps (C7) 2/4 Brachioradialis (C6) 2/4  Brachioradialis (C6) 2/4  Lower Extremity Lower Extremity  quadriceps (L-2 to L-4) 2/4   quadriceps (L-2 to L-4) 2/4 Achilles (S1) 2/4   Achilles (S1) 2/4  Plantars: Right: downgoing   Left: downgoing Cerebellar: normal finger-to-nose,  normal heel-to-shin test Gait: No tested   Lab Results: Lab Results  Component Value Date/Time   CHOL 128 01/07/2014  8:12 AM   Lipid Panel  Recent Labs  04/02/14 0833  TRIG 140    Studies/Results: Dg Chest Portable 1 View  04/03/2014   CLINICAL DATA:  Extubation  EXAM: PORTABLE CHEST - 1 VIEW  COMPARISON:  04/02/2014  FINDINGS: Endotracheal tube has been removed. Slightly improved lung volume. Bibasilar atelectasis/ infiltrate is unchanged. Negative for edema.  IMPRESSION: Bibasilar atelectasis/infiltrate is unchanged. Endotracheal tube removed.   Electronically Signed   By: Franchot Gallo M.D.   On: 04/03/2014 10:23    MEDICATIONS                                                                                                                        Scheduled: . antiseptic oral rinse  15 mL Mouth Rinse q12n4p  . carvedilol  3.125 mg Oral BID WC  . chlorhexidine  15 mL Mouth Rinse BID  . insulin aspart  0-15 Units Subcutaneous TID WC  . levETIRAcetam  500 mg Oral BID  . levothyroxine  100 mcg Oral QAC breakfast  . lisinopril  2.5 mg Oral Daily  . multivitamin  5 mL Per Tube Daily  . pantoprazole  40 mg Oral  Daily  . sodium chloride  3 mL Intravenous Q12H    ASSESSMENT/PLAN:                                                                                                            Early post traumatic seizure. Doing great. EEG unimpressive. Continue keppra. No active neurological issues at this time. Will sign off.  Dorian Pod, MD [facility] [phone]  04/04/2014, 3:44 PM

## 2014-04-04 NOTE — Evaluation (Signed)
Physical Therapy Evaluation Patient Details Name: Allen Myers MRN: [id number] DOB: 13-May-1946 Today's Date: 04/04/2014   History of Present Illness  68yo male with hx OSA, DM, HTN presented 6/19 after a fall on his roof, ?r/t syncopal episode during which he sustained SDH, SAH and multiple rib fx and L1 transverse process fx.  Clinical Impression  Patient demonstrates deficits in mobility as indicated below. Patient at supervision level for mobility at this time, will have adequate assist upon acute discharge, no further PT needs at this time.  OF NOTE: BP seated in chair 142/71 standing 130/67 with some mild lightheadedness upon initial standing. Nsg, and MD notified    Follow Up Recommendations No PT follow up;Supervision for mobility/OOB    Equipment Recommendations  None recommended by PT    Recommendations for Other Services       Precautions / Restrictions Precautions Precautions: Fall      Mobility  Bed Mobility               General bed mobility comments: received up in chair  Transfers Overall transfer level: Needs assistance Equipment used: None Transfers: Sit to/from Stand Sit to Stand: Supervision         General transfer comment: VCs for allowing time to adjust upon standing for positional changes  Ambulation/Gait Ambulation/Gait assistance: Supervision;Min guard Ambulation Distance (Feet): 160 Feet Assistive device: None Gait Pattern/deviations: Step-through pattern;Decreased stride length;Decreased weight shift to left;Antalgic Gait velocity: initially decreased, but improved with VCs  Gait velocity interpretation: Below normal speed for age/gender General Gait Details: Patient initially very guarded and slow with instability noted, encouraged patient to increased cadence and normalize gait, patient with imporved stability upon increased speed.   Stairs            Wheelchair Mobility    Modified Rankin (Stroke Patients Only)        Balance                                             Pertinent Vitals/Pain + orthostatic changes seated 142/71 and 130/67 standing    Home Living Family/patient expects to be discharged to:: Private residence Living Arrangements: Spouse/significant other Available Help at Discharge: Family Type of Home: House Home Access: Stairs to enter Entrance Stairs-Rails: None Technical brewer of Steps: 2 Home Layout: Two level;Bed/bath upstairs Home Equipment: None      Prior Function Level of Independence: Independent               Hand Dominance   Dominant Hand: Right    Extremity/Trunk Assessment   Upper Extremity Assessment: Overall WFL for tasks assessed           Lower Extremity Assessment: Overall WFL for tasks assessed         Communication   Communication: No difficulties (wears glasses)  Cognition Arousal/Alertness: Awake/alert                          General Comments General comments (skin integrity, edema, etc.): patient with positive orthostatic reaction and some mild light headedness reported with initial standing.     Exercises        Assessment/Plan    PT Assessment Patent does not need any further PT services  PT Diagnosis     PT Problem List    PT Treatment Interventions  PT Goals (Current goals can be found in the Care Plan section) Acute Rehab PT Goals PT Goal Formulation: No goals set, d/c therapy    Frequency     Barriers to discharge        Co-evaluation               End of Session   Activity Tolerance: Patient tolerated treatment well Patient left: in chair;with call bell/phone within reach;with family/visitor present Nurse Communication: Mobility status         Time: 1040-1107 PT Time Calculation (min): 27 min   Charges:   PT Evaluation $Initial PT Evaluation Tier I: 1 Procedure PT Treatments $Gait Training: 8-22 mins   PT G CodesDuncan Dull 04/04/2014, [place], PT DPT  [phone]

## 2014-04-05 DIAGNOSIS — R55 Syncope and collapse: Secondary | ICD-10-CM | POA: Diagnosis not present

## 2014-04-05 LAB — GLUCOSE, CAPILLARY
GLUCOSE-CAPILLARY: 201 mg/dL — AB (ref 70–99)
Glucose-Capillary: 194 mg/dL — ABNORMAL HIGH (ref 70–99)
Glucose-Capillary: 194 mg/dL — ABNORMAL HIGH (ref 70–99)

## 2014-04-05 LAB — T3: T3, Total: 46.6 ng/dl — ABNORMAL LOW (ref 80.0–204.0)

## 2014-04-05 LAB — T4, FREE: Free T4: 1.44 ng/dL (ref 0.80–1.80)

## 2014-04-05 LAB — RAPID URINE DRUG SCREEN, HOSP PERFORMED
AMPHETAMINES: NOT DETECTED
BENZODIAZEPINES: NOT DETECTED
Barbiturates: NOT DETECTED
COCAINE: NOT DETECTED
OPIATES: NOT DETECTED
TETRAHYDROCANNABINOL: NOT DETECTED

## 2014-04-05 MED ORDER — CARVEDILOL 6.25 MG PO TABS
6.2500 mg | ORAL_TABLET | Freq: Two times a day (BID) | ORAL | Status: DC
  Administered 2014-04-05 – 2014-04-06 (×2): 6.25 mg via ORAL
  Filled 2014-04-05: qty 1

## 2014-04-05 MED ORDER — LISINOPRIL 5 MG PO TABS
5.0000 mg | ORAL_TABLET | Freq: Every day | ORAL | Status: DC
  Administered 2014-04-06: 5 mg via ORAL
  Filled 2014-04-05: qty 1

## 2014-04-05 MED ORDER — LIVING WELL WITH DIABETES BOOK
Freq: Once | Status: AC
  Administered 2014-04-05: 17:00:00
  Filled 2014-04-05: qty 1

## 2014-04-05 MED ORDER — LINAGLIPTIN 5 MG PO TABS
5.0000 mg | ORAL_TABLET | Freq: Every day | ORAL | Status: DC
  Administered 2014-04-05 – 2014-04-06 (×2): 5 mg via ORAL
  Filled 2014-04-05 (×2): qty 1

## 2014-04-05 MED ORDER — PIOGLITAZONE HCL 45 MG PO TABS
45.0000 mg | ORAL_TABLET | Freq: Every day | ORAL | Status: DC
  Administered 2014-04-05 – 2014-04-06 (×2): 45 mg via ORAL
  Filled 2014-04-05 (×2): qty 1

## 2014-04-05 MED ORDER — METFORMIN HCL 500 MG PO TABS
1000.0000 mg | ORAL_TABLET | Freq: Two times a day (BID) | ORAL | Status: DC
  Administered 2014-04-05 – 2014-04-06 (×2): 1000 mg via ORAL
  Filled 2014-04-05 (×2): qty 2

## 2014-04-05 NOTE — Discharge Instructions (Addendum)
You have been referred for Outpatient Diabetes Education follow-up with [facility].  They will contact you by phone after discharge.  Should you not hear from within a reasonable amount of time, the phone number is [phone].  Be sure to ask about the support group they offer.   If you gain 2 pounds in 24 hours or 5 pounds in a week, call the [place] office at [phone] for instructions on taking medications.

## 2014-04-05 NOTE — Progress Notes (Signed)
Patient ID: Allen Myers, male   DOB: 08-Oct-1946, 68 y.o.   MRN: [id number]   LOS: 4 days   Subjective: Patient ready to go home, family has some concerns. After I left room they note some new dizziness that wasn't present yesterday.   Objective: Vital signs in last 24 hours: Temp:  [97.8 F (36.6 C)-98.9 F (37.2 C)] 97.8 F (36.6 C) (06/23 0655) Pulse Rate:  [64-90] 64 (06/23 0655) Resp:  [18-23] 18 (06/23 0655) BP: (119-149)/(61-71) 149/70 mmHg (06/23 0655) SpO2:  [89 %-93 %] 91 % (06/23 0655) Last BM Date: 04/03/14   Physical Exam General appearance: alert and no distress Resp: clear to auscultation bilaterally Cardio: regular rate and rhythm GI: normal findings: bowel sounds normal and soft, non-tender   Assessment/Plan: Syncopal event with fall on roof (Ground level fall) - syncopal workup per medicine  Traumatic brain injury with subarachnoid hemorrhage and subdural - Dr. Kathyrn Sheriff following, F/U CT stable  Left rib fracture #9 and #11 - pulm toilet(pulling1500), pain control.  L1 transverse process fracture - lumbar corset, zanaflex  Orthostatic hypotension-per cards, appreciate assistance  Seizures - Keppra, workup per neuro  Depressed TSH - synthroid decreased to 176mcg  DM - SSI change to TID, restart Metformin. DNE consult per family request VTE - SCD's, pharm dvt proph on hold for TBI  FEN - tolerating diet  Dispo -- Will get orthostatics but I suspect we can d/c patient to home later today    Lisette Abu, PA-C Pager: [phone] General Trauma PA Pager: [phone]  04/05/2014

## 2014-04-05 NOTE — Progress Notes (Signed)
Noted short runs of VT. EP eval pending. Otherwise doing well. I wonder if this was the cause of his disorientation. Patient examined and I agree with the assessment and plan  Georganna Skeans, MD, MPH, FACS Trauma: [phone] General Surgery: [phone]  04/05/2014 1:23 PM

## 2014-04-05 NOTE — Progress Notes (Signed)
Subjective: No CP or SOB.   Objective: Vital signs in last 24 hours: Temp:  [97.8 F (36.6 C)-98.9 F (37.2 C)] 98.2 F (36.8 C) (06/23 1000) Pulse Rate:  [60-90] 67 (06/23 1100) Resp:  [18-21] 18 (06/23 1000) BP: (119-149)/(61-74) 135/69 mmHg (06/23 1101) SpO2:  [90 %-94 %] 94 % (06/23 1000) Last BM Date: 04/03/14  Intake/Output from previous day: 06/22 0701 - 06/23 0700 In: 475 [P.O.:180; I.V.:190; IV Piggyback:105] Out: -  Intake/Output this shift: Total I/O In: 360 [P.O.:360] Out: -   Medications Current Facility-Administered Medications  Medication Dose Route Frequency Provider Last Rate Last Dose  . acetaminophen (TYLENOL) tablet 325-650 mg  325-650 mg Oral Q6H PRN Emina Riebock, NP      . antiseptic oral rinse (BIOTENE) solution 15 mL  15 mL Mouth Rinse q12n4p Brand Males, MD   15 mL at 04/04/14 1644  . carvedilol (COREG) tablet 3.125 mg  3.125 mg Oral BID WC Lelon Perla, MD   3.125 mg at 04/05/14 0759  . chlorhexidine (PERIDEX) 0.12 % solution 15 mL  15 mL Mouth Rinse BID Brand Males, MD   15 mL at 04/05/14 0759  . insulin aspart (novoLOG) injection 0-15 Units  0-15 Units Subcutaneous TID WC Emina Riebock, NP   3 Units at 04/05/14 0759  . labetalol (NORMODYNE,TRANDATE) injection 10 mg  10 mg Intravenous Q2H PRN Marijean Heath, NP   10 mg at 04/02/14 0916  . levETIRAcetam (KEPPRA) tablet 500 mg  500 mg Oral BID Rande Lawman Rumbarger, RPH   500 mg at 04/05/14 6384  . levothyroxine (SYNTHROID, LEVOTHROID) tablet 100 mcg  100 mcg Oral QAC breakfast Megan Dort, PA-C   100 mcg at 04/05/14 6659  . linagliptin (TRADJENTA) tablet 5 mg  5 mg Oral Daily Lisette Abu, PA-C      . lisinopril (PRINIVIL,ZESTRIL) tablet 2.5 mg  2.5 mg Oral Daily Lelon Perla, MD   2.5 mg at 04/05/14 9357  . metFORMIN (GLUCOPHAGE) tablet 1,000 mg  1,000 mg Oral BID WC Lisette Abu, PA-C      . morphine 2 MG/ML injection 2-4 mg  2-4 mg Intravenous Q2H PRN Zenovia Jarred, MD   2 mg at 04/03/14 0931  . multivitamin liquid 5 mL  5 mL Per Tube Daily Heather Cornelison Pitts, RD   5 mL at 04/05/14 0952  . oxyCODONE (Oxy IR/ROXICODONE) immediate release tablet 5-10 mg  5-10 mg Oral Q4H PRN Zenovia Jarred, MD   10 mg at 04/05/14 1102  . pantoprazole (PROTONIX) EC tablet 40 mg  40 mg Oral Daily Rande Lawman Rumbarger, RPH   40 mg at 04/05/14 0177  . pioglitazone (ACTOS) tablet 45 mg  45 mg Oral Daily Lisette Abu, PA-C      . sodium chloride 0.9 % injection 3 mL  3 mL Intravenous Q12H Emina Riebock, NP   3 mL at 04/04/14 2133  . sodium chloride 0.9 % injection 3 mL  3 mL Intravenous PRN Erby Pian, NP        PE: WD/WN NAD HEENT normal Neck supple Chest CTA CV RRR Abd soft Ext no edema Lab Results:   Recent Labs  04/03/14 0200 04/04/14 0300  WBC 9.7 6.2  HGB 12.6* 12.0*  HCT 38.7* 36.3*  PLT 151 123*   BMET  Recent Labs  04/03/14 0200 04/04/14 0300  NA 138 139  K 4.5 3.5*  CL 101 101  CO2 23 25  GLUCOSE 201* 154*  BUN 14 10  CREATININE 0.92 0.82  CALCIUM 8.6 8.5   Cardiac Panel (last 3 results)  Recent Labs  04/03/14 0920 04/03/14 1336 04/03/14 1904  TROPONINI <0.30 <0.30 <0.30   Lipid Panel     Component Value Date/Time   CHOL 128 01/07/2014 0812   TRIG 140 04/02/2014 0833   HDL 35.40* 01/07/2014 0812   CHOLHDL 4 01/07/2014 0812   VLDL 34.4 01/07/2014 0812   LDLCALC 58 01/07/2014 0812    Assessment/Plan   68 year old male was up on the roof of his deck 04/01/14 when he became disoriented. His wife witnessed him stumble around on the roof. He fell down, hitting his back on a broom. But did not fall off the roof. He had positive loss of consciousness. He was initially described as purple but began breathing again. He recalls both feeling disoriented and then regaining consciousness with EMS personnel attending to him. In ER he complained of back pain. He also noted he did fall down the night before getting up out of  bed but attributed that to some leg cramps. He was found in ER to have traumatic brain injury with subarachnoid hemorrhage and subdural, L1 transverse process fracture,   Left rib fracture #9 and #11.  1 cardiomyopathy-etiology unclear. Given history of diabetes mellitus coronary artery disease needs to be excluded. However he had a subarachnoid hemorrhage at the time of his syncopal episode. Therefore anticoagulation would be contraindicated if PCI or coronary artery bypassing graft where necessary. I will ask neurosurgery for input concerning when anticoagulation would be safe. Would then proceed with cardiac catheterization at that time. Increase carvedilol to 6.25 mg by mouth twice a day and increase lisinopril to 5 mg daily. Continue to titrate medications as tolerated by pulse and blood pressure. 2 Syncope-etiology unclear but extremely concerning for ventricular arrhythmia. Echocardiogram shows severely reduced LV function with ejection fraction of 30-35%. Review of telemetry from June 20 shows nonsustained ventricular tachycardia. Continue beta blocker. I will arrange a life vest prior to discharge. Proceed with cardiac catheterization when able. Revascularize as needed. Repeat echocardiogram 3 months after medication titration. If ejection fraction less than 35% would need ICD. I will ask EP to review the above plan. Patient should not be discharged until life vest can be arranged. Continue telemetry. 3 diabetes mellitus 4 status post subarachnoid hemorrhage-per neurosurgery 5 Decreased TSH-possible euthyroid sick; check free T4.     LOS: 4 days    Kirk Ruths MD 04/05/2014 11:10 AM

## 2014-04-05 NOTE — Progress Notes (Signed)
Inpatient Diabetes Program Recommendations  AACE/ADA: New Consensus Statement on Inpatient Glycemic Control (2013)  Target Ranges:  Prepandial:   less than 140 mg/dL      Peak postprandial:   less than 180 mg/dL (1-2 hours)      Critically ill patients:  140 - 180 mg/dL   Reason for Visit: Diabetes Coordinator Consult by RN-- education, especially dietary education  Diabetes history: Type 2, long term poorly control Outpatient Diabetes medications: Actos 45 mg daily, Metformin 1000 mg bid, Tradjenta 5 mg daily Current orders for Inpatient glycemic control: Home DM meds started today along with Novolog moderate correction tid  Note:  Wife present at time of my visit.  Discussed long-term sub-optimal glycemic control as evidenced by A1C's-- 9.5 this admission, 9.20 December 2013, 10.16 May 2013.  Followed by Dr. Cathlean Cower.  Patient states last visit Dr. Jenny Reichmann wanted to add insulin to his regimen, but patient and wife wanted to defer for 6 months while he tried to lose weight and eat better.  Discussed with patient and wife that type 2 diabetes is a progressive disease.  The longer someone has type 2 the greater the chance insulin will be required eventually.  Discussed the use of basal insulin to improve glycemic control.  Also discussed use of rapid insulin to correct elevated glucoses and cover CHO's.  Encouraged patient to follow-up as soon as possible with Dr. Jenny Reichmann regarding his glycemic control management since his injury.  Both patient and wife state they are not as fearful regarding being on insulin since he has taken some during this admission.    Discussed option of outpatient diabetes education follow-up after discharge to focus on weight loss, CHO counting and making better food choices.  Agreeable to referral to [facility].  Consult placed with inpatient dietitian to return to talk with wife as well prior to his anticipated discharge tomorrow.  Also encouraged attending the monthly support group  offered.  Patient not checking CBG's at home because every time he checks it is the same-- and too high.  Told him that he needs to check CBG's so he can see how changes he is making are impacting his control.  If control doesn't improve despite efforts on his part, he may need insulin  Patient and wife thanked my for my visit.   Thank you.  Patti S. Marcelline Mates, RN, CNS, CDE Inpatient Diabetes Program, team pager [phone]

## 2014-04-05 NOTE — Progress Notes (Signed)
Discussed with neurosurgery. Ideally would want to wait 3-4 weeks before placing on blood thinning agents due to bleeding risk. However, if cath is pursued in the next 1-2 weeks (or more emergently), patient would need repeat CT scan to show resolution/exclude progression of SDH before proceeding. Dayna Dunn PA-C

## 2014-04-05 NOTE — Consult Note (Signed)
ELECTROPHYSIOLOGY CONSULT NOTE    Patient ID: Allen Myers MRN: [id number], DOB/AGE: 11-04-1945 68 y.o.  Admit date: 04/01/2014 Date of Consult: 04-05-2014  Primary Physician: Cathlean Cower, MD Primary Cardiologist: new to [facility]  Reason for Consultation: syncope with newly depressed EF  HPI:  Allen Myers is a 68 y.o. male with a past medical history significant for diabetes, hyperlipidemia, hypothyroidism, and sleep apnea.  He is fairly active and was in his usual state of health until the day of admission.  He was working on the roof of his deck when he became disoriented and had a loss of consciousness.  This was preceded by dizziness.  He denies palpitations associated with the episode.    Lab work on admission was notable for lactic acid of 11.2, TSH of 0.107 (FT3 and FT4 pending), WBC 11, troponin peak of 1.4.  Imaging demonstrated subdural hematoma. The day after admission, he had an episode of unresponsiveness (? Seizure) with apnea.  He had spontaneous return of breathing but was intubated for airway protection. He has subsequently been extubated and underwent EEG which was unremarkable.    Telemetry has demonstrated sinus rhythm with rare NSVT and PVC's.  There has been no sustained ventricular ectopy.    Echocardiogram demonstrated EF of 30-35%, mild LVH, diffuse hypokinesis, grade 1 diastolic dysfunction.    EP has been asked to evaluate for treatment options.   He states that he has had increasing shortness of breath with exertion for the past several months.  He denies chest pain, palpitations, other episodes of pre-syncope or syncope.  ROS is otherwise negative.   Past Medical History  Diagnosis Date  . ALLERGIC RHINITIS 05/07/2007  . DEPRESSION 05/07/2007  . DIABETES MELLITUS, TYPE II 05/07/2007  . HYPERLIPIDEMIA 05/07/2007  . HYPOTHYROIDISM 05/07/2007  . SLEEP APNEA, OBSTRUCTIVE 05/07/2007  . COLONIC POLYPS, HX OF 12/07/2007  . DIVERTICULITIS, HX OF 05/07/2007    . DIVERTICULOSIS, COLON 12/07/2007  . ERECTILE DYSFUNCTION 05/07/2007  . NEPHROLITHIASIS, HX OF 12/07/2007  . HYPERTENSION 05/16/2008    pt denies     Surgical History:  Past Surgical History  Procedure Laterality Date  . Colonoscopy    . Polypectomy       Prescriptions prior to admission  Medication Sig Dispense Refill  . aspirin 81 MG EC tablet Take 81 mg by mouth daily.        Marland Kitchen levothyroxine (SYNTHROID, LEVOTHROID) 175 MCG tablet Take 175 mcg by mouth daily before breakfast.      . linagliptin (TRADJENTA) 5 MG TABS tablet Take 1 tablet (5 mg total) by mouth daily.  30 tablet  1  . lisinopril (PRINIVIL,ZESTRIL) 5 MG tablet Take 5 mg by mouth daily.      Marland Kitchen lovastatin (MEVACOR) 20 MG tablet Take 20 mg by mouth at bedtime.      . metFORMIN (GLUCOPHAGE) 1000 MG tablet Take 1,000 mg by mouth 2 (two) times daily with a meal.      . pioglitazone (ACTOS) 45 MG tablet Take 45 mg by mouth daily.        Inpatient Medications:  . antiseptic oral rinse  15 mL Mouth Rinse q12n4p  . carvedilol  6.25 mg Oral BID WC  . chlorhexidine  15 mL Mouth Rinse BID  . insulin aspart  0-15 Units Subcutaneous TID WC  . levETIRAcetam  500 mg Oral BID  . levothyroxine  100 mcg Oral QAC breakfast  . linagliptin  5 mg Oral Daily  . [  START ON 04/06/2014] lisinopril  5 mg Oral Daily  . metFORMIN  1,000 mg Oral BID WC  . multivitamin  5 mL Per Tube Daily  . pantoprazole  40 mg Oral Daily  . pioglitazone  45 mg Oral Daily  . sodium chloride  3 mL Intravenous Q12H    Allergies: No Known Allergies  History   Social History  . Marital Status: Married    Spouse Name: N/A    Number of Children: 3  . Years of Education: N/A   Occupational History  . [phone] ([employer])   . [employer] based in [place] History Main Topics  . Smoking status: Former Smoker -- 1.00 packs/day for 30 years    Types: Cigarettes    Quit date: 10/15/1983  . Smokeless tobacco: Never Used  . Alcohol Use: 0.6  oz/week    1 Glasses of wine per week     Comment: social  . Drug Use: No  . Sexual Activity: Not on file   Other Topics Concern  . Not on file   Social History Narrative   3 daughters 69, 63, 74 and 2 grandchildren     Family History  Problem Relation Age of Onset  . Cancer Brother     prostate cancer  . Cancer Cousin     lung cancer  . Colon cancer Neg Hx   . Rectal cancer Neg Hx   . Stomach cancer Neg Hx   . Lung cancer Father     dad was a smoker  . Heart disease Father   . Heart disease Mother     BP 135/63  Pulse 76  Temp(Src) 98.2 F (36.8 C) (Oral)  Resp 18  Ht 6\' 1"  (1.854 m)  Wt 264 lb 1.8 oz (119.8 kg)  BMI 34.85 kg/m2  SpO2 94% Physical Exam: Filed Vitals:   04/05/14 1100 04/05/14 1101 04/05/14 1102 04/05/14 1345  BP: 147/74 135/69 135/63 129/72  Pulse: 67 65 76 64  Temp:    98.9 F (37.2 C)  TempSrc:    Oral  Resp:    18  Height:      Weight:      SpO2:    94%    GEN- The patient is well appearing, alert and oriented x 3 today.   Head- normocephalic, atraumatic Eyes-  Sclera clear, conjunctiva pink Ears- hearing intact Oropharynx- clear Neck- supple, no JVP Lymph- no cervical lymphadenopathy Lungs- Clear to ausculation bilaterally, normal work of breathing Heart- Regular rate and rhythm, no murmurs, rubs or gallops, PMI not laterally displaced GI- soft, NT, ND, + BS Extremities- no clubbing, cyanosis, or edema MS- no significant deformity or atrophy Skin- no rash or lesion Psych- euthymic mood, full affect Neuro- strength and sensation are intact   Labs:   Lab Results  Component Value Date   WBC 6.2 04/04/2014   HGB 12.0* 04/04/2014   HCT 36.3* 04/04/2014   MCV 86.4 04/04/2014   PLT 123* 04/04/2014    Recent Labs Lab 04/01/14 1941  04/04/14 0300  NA 135*  < > 139  K 4.7  < > 3.5*  CL 98  < > 101  CO2 21  < > 25  BUN 25*  < > 10  CREATININE 0.94  < > 0.82  CALCIUM 9.1  < > 8.5  PROT 7.0  --   --   BILITOT 0.4  --   --    ALKPHOS 96  --   --  ALT 19  --   --   AST 26  --   --   GLUCOSE 318*  < > 154*  < > = values in this interval not displayed. Lab Results  Component Value Date   TROPONINI <0.30 04/03/2014     Radiology/Studies: Ct Head Without Contrast 04/02/2014   CLINICAL DATA:  Followup tube lead, patient suffered seizure and cardiac arrest on the way 2 CT department this morning, worrisome for worsening bleed  EXAM: CT HEAD WITHOUT CONTRAST  TECHNIQUE: Contiguous axial images were obtained from the base of the skull through the vertex without intravenous contrast.  COMPARISON:  04/01/2014  FINDINGS: Again identified is acute subarachnoid hemorrhage in the left temporal and occipital lobes. Acute subdural hematoma over the right frontal and parietal lobes is unchanged at about 6 mm. Posteriorly at the left vertex there is in unchanged acute subdural hematoma measuring 6 mm. A few foci of parenchymal contusions seen bilaterally at the vertex, measuring 13 mm on the right and 7 mm on the left, unchanged. No evidence of vascular territory infarct. Stable cortical atrophy. No hydrocephalus. No intraventricular hemorrhage.  IMPRESSION: Stable intracranial hemorrhage including left-sided temporal and occipital subarachnoid hemorrhage, bilateral subdural hematomas, and bilateral parenchymal contusions at the vertex. No significant mass effect or midline shift. No hydrocephalus.   Electronically Signed   By: Skipper Cliche M.D.   On: 04/02/2014 10:38   Ct Angio Chest Pe W/cm &/or Wo Cm 04/02/2014   CLINICAL DATA:  Intracranial hemorrhage, cardiac arrest, intubated, recent fall, suspect possible pulmonary embolism  EXAM: CT ANGIOGRAPHY CHEST WITH CONTRAST  TECHNIQUE: Multidetector CT imaging of the chest was performed using the standard protocol during bolus administration of intravenous contrast. Multiplanar CT image reconstructions and MIPs were obtained to evaluate the vascular anatomy.  CONTRAST:  157mL OMNIPAQUE IOHEXOL  350 MG/ML SOLN  COMPARISON:  04/01/2014  FINDINGS: The patient has been intubated with endotracheal tube tip above the carina.  There is extensive bilateral deep dependent consolidation involving the upper middle and lower lung zones consistent with significant dependent atelectasis. The aerated non dependent portions of the lungs are clear.  No significant hilar or mediastinal adenopathy. No pleural effusion.  There are no filling defects in the pulmonary arterial system.  Scans the upper abdomen are unremarkable.  Review of the MIP images confirms the above findings.  IMPRESSION: No evidence of pulmonary arterial embolism. There has developed significant dependent atelectasis bilaterally.   Electronically Signed   By: Skipper Cliche M.D.   On: 04/02/2014 10:52   Dg Chest Portable 1 View 04/03/2014   CLINICAL DATA:  Extubation  EXAM: PORTABLE CHEST - 1 VIEW  COMPARISON:  04/02/2014  FINDINGS: Endotracheal tube has been removed. Slightly improved lung volume. Bibasilar atelectasis/ infiltrate is unchanged. Negative for edema.  IMPRESSION: Bibasilar atelectasis/infiltrate is unchanged. Endotracheal tube removed.   Electronically Signed   By: Franchot Gallo M.D.   On: 04/03/2014 10:23   EKGs are reviewed Echo is reviewed  TELEMETRY: sinus rhythm with PVC's and rare NSVT  Assessment and Plan:  1. Syncope The patient presents with syncope in the setting of a newly depressed EF (30%).  I have spoken with Dr Stanford Breed.  We both agree that further CV risk stratification is required.  Unfortunately, we will have to wait until his SDH is recovered (several weeks).  Decisions regarding implantation of an ICD should be deferred until after his CV evaluation.  For now, I agree with Dr Stanford Breed that medical  management is our only option.  Continue to titrate coreg as HR/BP allow.  I would advise lifevest placement at discharge.  This will be arranged by me today. He will follow-up with Dr Stanford Breed in 2 weeks. I  would like to see for further EP input after his CV risk stratification is performed. I have been very clear with him today that he cannot drive for 6 months per DMV requirement.   Electrophysiology team to see as needed while here. Please call with questions.

## 2014-04-05 NOTE — Evaluation (Addendum)
Speech Language Pathology Evaluation Patient Details Name: Allen Myers MRN: [id number] DOB: 06/27/1946 Today's Date: 04/05/2014 Time: 4268-3419 SLP Time Calculation (min): 15 min  Problem List:  Patient Active Problem List   Diagnosis Date Noted  . Obesity (BMI 30.0-34.9) 04/04/2014  . Acute respiratory failure 04/02/2014  . Atrial fibrillation with RVR 04/02/2014  . HTN (hypertension) 04/02/2014  . Cardiomyopathy, to determine ischemic vs non ischemic 04/02/2014  . Seizure 04/02/2014  . Fracture of L1 vertebra 04/02/2014  . Rib fracture, Lt, #9 & 11 04/02/2014  . Syncope 04/02/2014  . NSTEMI (non-ST elevated myocardial infarction), type 2 most likely secondary to seizure  04/02/2014  . SAH (subarachnoid hemorrhage) 04/01/2014  . Acute sinus infection 01/11/2014  . OSA (obstructive sleep apnea) 07/22/2013  . Adenomatous polyp 06/17/2012  . Back pain 12/22/2011  . Fatigue 02/19/2011  . Preventative health care 02/18/2011  . SHOULDER PAIN 07/14/2009  . PLANTAR FASCIITIS, BILATERAL 06/12/2009  . THUMB PAIN, BILATERAL 06/12/2009  . HYPERTENSION 05/16/2008  . DIVERTICULOSIS, COLON 12/07/2007  . COLONIC POLYPS, HX OF 12/07/2007  . NEPHROLITHIASIS, HX OF 12/07/2007  . HYPOTHYROIDISM 05/07/2007  . DIABETES MELLITUS, TYPE II 05/07/2007  . HYPERLIPIDEMIA 05/07/2007  . ERECTILE DYSFUNCTION 05/07/2007  . DEPRESSION 05/07/2007  . ALLERGIC RHINITIS 05/07/2007  . DIVERTICULITIS, HX OF 05/07/2007   Past Medical History:  Past Medical History  Diagnosis Date  . ALLERGIC RHINITIS 05/07/2007  . DEPRESSION 05/07/2007  . DIABETES MELLITUS, TYPE II 05/07/2007  . HYPERLIPIDEMIA 05/07/2007  . HYPOTHYROIDISM 05/07/2007  . SLEEP APNEA, OBSTRUCTIVE 05/07/2007  . COLONIC POLYPS, HX OF 12/07/2007  . DIVERTICULITIS, HX OF 05/07/2007  . DIVERTICULOSIS, COLON 12/07/2007  . ERECTILE DYSFUNCTION 05/07/2007  . NEPHROLITHIASIS, HX OF 12/07/2007  . HYPERTENSION 05/16/2008    pt denies   Past Surgical  History:  Past Surgical History  Procedure Laterality Date  . Colonoscopy    . Polypectomy     HPI:  68 yr old admitted after becoming disoriented while up on his roof of his deck with loss of consciousness. He was initially described as purple but began breathing again.  CT 6/19 revealed Acute subarachnoid hemorrhage tracking along the left temporal and occipital lobes, acute subdural hematoma overlying the right frontal and parietal lobes and overlying the left parietal lobe, bilateral focal intraparenchymal contusions noted at the vertex.  PMH:  OSA, HTN, DM, allergic rhinitis, diverticulitis.   Assessment / Plan / Recommendation Clinical Impression  Pt. demonstrated functional cognitive abilities for basic and moderately complex concepts including working memory, awareness and problem solving.  Speech and language WFL's.  He appears mildly fatigued this morning with periods of decreased endurance.  Demostrates behavior similar to a Rancho VIII brain injury scale (purposeful; appropriate). Wife and pt. stated he has been back to baseline for several days and agree with results of this assessment.  No needs at this time.  Educated to notify MD if difficulties arise with cognition.     SLP Assessment  Patient does not need any further Speech Lanaguage Pathology Services    Follow Up Recommendations  None    Frequency and Duration        Pertinent Vitals/Pain WDL       SLP Evaluation Prior Functioning  Cognitive/Linguistic Baseline: Within functional limits Type of Home: House  Lives With: Spouse Available Help at Discharge: Family Vocation: Full time employment   Cognition  Overall Cognitive Status: Within Functional Limits for tasks assessed Orientation Level: Oriented X4 [place] of  Cognitive Functioning: Purposeful/appropriate    Comprehension  Auditory Comprehension Overall Auditory Comprehension: Appears within functional limits for tasks  assessed Visual Recognition/Discrimination Discrimination: Not tested Reading Comprehension Reading Status: Within funtional limits    Expression Expression Primary Mode of Expression: Verbal Verbal Expression Overall Verbal Expression: Appears within functional limits for tasks assessed Written Expression Dominant Hand: Right Written Expression: Not tested   Oral / Motor Oral Motor/Sensory Function Overall Oral Motor/Sensory Function: Appears within functional limits for tasks assessed Motor Speech Overall Motor Speech: Appears within functional limits for tasks assessed   GO     Houston Siren M.Ed [employer] [phone]  04/05/2014  04/05/2014

## 2014-04-06 DIAGNOSIS — R55 Syncope and collapse: Secondary | ICD-10-CM | POA: Diagnosis not present

## 2014-04-06 LAB — BASIC METABOLIC PANEL
BUN: 10 mg/dL (ref 6–23)
CHLORIDE: 99 meq/L (ref 96–112)
CO2: 24 meq/L (ref 19–32)
Calcium: 8.9 mg/dL (ref 8.4–10.5)
Creatinine, Ser: 0.67 mg/dL (ref 0.50–1.35)
GFR calc Af Amer: >90 mL/min (ref >90)
GFR calc non Af Amer: >90 mL/min (ref >90)
Glucose, Bld: 207 mg/dL — ABNORMAL HIGH (ref 70–99)
Potassium: 4.2 mEq/L (ref 3.7–5.3)
Sodium: 137 mEq/L (ref 137–147)

## 2014-04-06 LAB — GLUCOSE, CAPILLARY
GLUCOSE-CAPILLARY: 208 mg/dL — AB (ref 70–99)
GLUCOSE-CAPILLARY: 204 mg/dL — AB (ref 70–99)
Glucose-Capillary: 208 mg/dL — ABNORMAL HIGH (ref 70–99)

## 2014-04-06 MED ORDER — OXYCODONE HCL 5 MG PO TABS: 5.0000 mg | ORAL_TABLET | Freq: Four times a day (QID) | ORAL | Status: DC | PRN

## 2014-04-06 MED ORDER — POTASSIUM CHLORIDE CRYS ER 20 MEQ PO TBCR
40.0000 meq | EXTENDED_RELEASE_TABLET | Freq: Once | ORAL | Status: AC
  Administered 2014-04-06: 40 meq via ORAL
  Filled 2014-04-06 (×2): qty 2

## 2014-04-06 MED ORDER — LEVETIRACETAM 500 MG PO TABS: 500.0000 mg | ORAL_TABLET | Freq: Two times a day (BID) | ORAL | Status: DC

## 2014-04-06 MED ORDER — LEVOTHYROXINE SODIUM 100 MCG PO TABS: 100.0000 ug | ORAL_TABLET | Freq: Every day | ORAL | Status: DC

## 2014-04-06 MED ORDER — CARVEDILOL 6.25 MG PO TABS: 6.2500 mg | ORAL_TABLET | Freq: Two times a day (BID) | ORAL | Status: DC

## 2014-04-06 NOTE — Progress Notes (Signed)
Pt noncompliant with getting back brace on while out of bed. Pt states that the back brace hurts his back. Life vest was brought in and tech oriented pt and spouse. Pt had no c/o pain or discomfort voiced during the night.

## 2014-04-06 NOTE — Progress Notes (Signed)
Trauma Service Note  Subjective: Patient doing okay and awaiting decision about ongoing care from Cards.  Objective: Vital signs in last 24 hours: Temp:  [98.1 F (36.7 C)-99.2 F (37.3 C)] 99.2 F (37.3 C) (06/24 0544) Pulse Rate:  [60-76] 66 (06/24 0544) Resp:  [16-18] 18 (06/24 0544) BP: (125-151)/(59-76) 151/76 mmHg (06/24 0544) SpO2:  [91 %-100 %] 93 % (06/24 0544) Last BM Date: 04/03/14  Intake/Output from previous day: 06/23 0701 - 06/24 0700 In: 600 [P.O.:600] Out: -  Intake/Output this shift:    General: No acute distress.  Seems ready to go home  Lungs: Clear  Abd: Benign  Extremities: No clinical signs or symptoms of DVT  Neuro: Intact  Lab Results: CBC   Recent Labs  04/04/14 0300  WBC 6.2  HGB 12.0*  HCT 36.3*  PLT 123*   BMET  Recent Labs  04/04/14 0300 04/06/14 0618  NA 139 137  K 3.5* 4.2  CL 101 99  CO2 25 24  GLUCOSE 154* 207*  BUN 10 10  CREATININE 0.82 0.67  CALCIUM 8.5 8.9   PT/INR No results found for this basename: LABPROT, INR,  in the last 72 hours ABG No results found for this basename: PHART, PCO2, PO2, HCO3,  in the last 72 hours  Studies/Results: No results found.  Anti-infectives: Anti-infectives   None      Assessment/Plan: s/p  Discharge, possible later today.  LOS: 5 days   Kathryne Eriksson. Dahlia Bailiff, MD, FACS [phone] Trauma Surgeon 04/06/2014

## 2014-04-06 NOTE — Discharge Summary (Signed)
[facility] Discharge Summary   Patient ID: JAYLUN FLEENER MRN: [id number] DOB/AGE: 06/13/1946 68 y.o.  Admit date: 04/01/2014 Discharge date: 04/06/2014  Discharge Diagnoses Patient Active Problem List   Diagnosis Date Noted  . Obesity (BMI 30.0-34.9) 04/04/2014  . Acute respiratory failure 04/02/2014  . Atrial fibrillation with RVR 04/02/2014  . HTN (hypertension) 04/02/2014  . Cardiomyopathy, to determine ischemic vs non ischemic 04/02/2014  . Seizure 04/02/2014  . Fracture of L1 vertebra 04/02/2014  . Rib fracture, Lt, #9 & 11 04/02/2014  . Syncope 04/02/2014  . NSTEMI (non-ST elevated myocardial infarction), type 2 most likely secondary to seizure  04/02/2014  . SAH (subarachnoid hemorrhage) 04/01/2014  . Acute sinus infection 01/11/2014  . OSA (obstructive sleep apnea) 07/22/2013  . Adenomatous polyp 06/17/2012  . Back pain 12/22/2011  . Fatigue 02/19/2011  . Preventative health care 02/18/2011  . SHOULDER PAIN 07/14/2009  . PLANTAR FASCIITIS, BILATERAL 06/12/2009  . THUMB PAIN, BILATERAL 06/12/2009  . HYPERTENSION 05/16/2008  . DIVERTICULOSIS, COLON 12/07/2007  . COLONIC POLYPS, HX OF 12/07/2007  . NEPHROLITHIASIS, HX OF 12/07/2007  . HYPOTHYROIDISM 05/07/2007  . DIABETES MELLITUS, TYPE II 05/07/2007  . HYPERLIPIDEMIA 05/07/2007  . ERECTILE DYSFUNCTION 05/07/2007  . DEPRESSION 05/07/2007  . ALLERGIC RHINITIS 05/07/2007  . DIVERTICULITIS, HX OF 05/07/2007    Consultants Dr. Marlou Porch (Electrophysiology) Dr. Percival Spanish (Cardiology) Dr. Armida Sans (Neurology) Dr. Kathyrn Sheriff (Neurosurgery) Dr. Nelda Marseille (CCM) Dr. Arnoldo Morale (Internal medicine)  Procedures None  Hospital Course:  68 y/o male presented to [facility] by EMS after a ground level fall on a flat top roof where he was repairing a gutter.   Patient was up on the roof of his deck when he became disoriented. His wife witnessed him stumble around the midback. He fell down, hitting his back on a  broom. He had positive loss of consciousness. He was initially described as purple, but began breathing again. He recalls both feeling disoriented and then regaining consciousness with EMS personnel attending to him.  He complained of back pain. He also notes he did fall down last night getting up out of bed but attributed that to some leg cramps.  Workup showed TBI with SAH and SDH, L1 TP fx, and left rib fx #9 and #11.  Internal medicine was consulted regarding syncope workup.   Loraine Grip from neurosurgery also consulted.  He was admitted to the ICU for close monitoring.  On HD#2 he was going down for repeat head CT when he began to have tonic-clonic seizures b/l in the elevator. Prior to going down to CT he was neurologically intact and only complaining of a headache. A code was called and he was transferred back to 68M. CCM was consulted due to respiratory distress and mental status changes, and rapid intubation was initiated to protect his post-ictal airway.  He was started on Keppra and PRN ativan.  Cardiology, Electrophysiology and Neurology were also consulted.  Once stabilized he went down for repeat CT which was stable without mass effect.  He was weaned from the vent and eventually extubated.  His seizure was thought to be a result of trauma.  Syncope cause is still unknown, but the patient has a newly depressed EF of 30%, further cardiology workup is needed before identifying a cause, but they will have to wait until SDH is recovered.  They are treating him with medical management including coreg, lifevest.  He will follow up with Dr. Stanford Breed in 2 weeks.  He is advised not  to drive for 6 months per [facility] requirements.  He is to hold anticoagulation including aspirin 3-4 weeks before starting blood thinners due to increased bleeding risks.  He will follow up with neurosurgery to get a repeat head CT prior to anticoagulation starting.  It appears that cath is scheduled in 4 weeks.  He will need repeat  echo in 3 months.  He will resume all his home meds except aspirin and start taking Keppra and Coreg.     Diet was advanced as tolerated.  On HD #5, the patient was voiding well, tolerating diet, ambulating well, pain well controlled, vital signs stable,  and felt stable for discharge home with the care of his wife and daughter.  Patient will need follow up with the above consultants as listed, and he can call us as needed for questions or concerns.      Medication List    STOP taking these medications       aspirin 81 MG EC tablet      TAKE these medications       carvedilol 6.25 MG tablet  Commonly known as:  COREG  Take 1 tablet (6.25 mg total) by mouth 2 (two) times daily with a meal.     levETIRAcetam 500 MG tablet  Commonly known as:  KEPPRA  Take 1 tablet (500 mg total) by mouth 2 (two) times daily.     levothyroxine 100 MCG tablet  Commonly known as:  SYNTHROID, LEVOTHROID  Take 1 tablet (100 mcg total) by mouth daily before breakfast.     linagliptin 5 MG Tabs tablet  Commonly known as:  TRADJENTA  Take 1 tablet (5 mg total) by mouth daily.     lisinopril 5 MG tablet  Commonly known as:  PRINIVIL,ZESTRIL  Take 5 mg by mouth daily.     lovastatin 20 MG tablet  Commonly known as:  MEVACOR  Take 20 mg by mouth at bedtime.     metFORMIN 1000 MG tablet  Commonly known as:  GLUCOPHAGE  Take 1,000 mg by mouth 2 (two) times daily with a meal.     oxyCODONE 5 MG immediate release tablet  Commonly known as:  Oxy IR/ROXICODONE  Take 1-2 tablets (5-10 mg total) by mouth every 6 (six) hours as needed (pain).     pioglitazone 45 MG tablet  Commonly known as:  ACTOS  Take 45 mg by mouth daily.         Follow-up Information   Follow up with Kirk Ruths, MD On 04/21/2014. (2:30 PM, with the cardiologist)    Specialty:  Cardiology   Contact information:   [address] [place] [address] [phone]       Follow up with Dorian Pod, MD.  Schedule an appointment as soon as possible for a visit in 2 weeks. (For post hospital follow up with the neurologist)    Specialty:  Neurology   Contact information:   [address] [place] [address] [phone]       Schedule an appointment as soon as possible for a visit with Consuella Lose, C, MD. (For post-hospital follow up with the neurosurgeon)    Specialty:  Neurosurgery   Contact information:   [address] [place] [address] [phone]       Follow up with Cathlean Cower, MD. Schedule an appointment as soon as possible for a visit in 2 weeks. (FOR POST HOSPITAL FOLLOW UP REGARDING YOUR HYPOTHYROIDISM.  YOUR TSH WAS ACTUALLY HIGH IN THE HOSPITAL THUS YOUR DOSAGE WAS DECREASED TO 100MCG.  You also need a check on your Diabetes.)    Specialties:  Internal Medicine, Radiology   Contact information:   [place] [address] [phone]       Call [place]. (As needed)    Contact information:   [address] [place] [address] [phone]       Signed: Nehemiah Massed. Dort, [facility]  Trauma Service [phone]  04/06/2014, 2:19 PM

## 2014-04-06 NOTE — Progress Notes (Signed)
  RD consulted for nutrition education regarding diabetes.   Lab Results  Component Value Date   HGBA1C 9.5* 04/02/2014   RD met with patient and his wife.  RD provided "Carbohydrate Counting for People with Diabetes" handout from the Academy of Nutrition and Dietetics (AND). Discussed different food groups and their effects on blood sugar, emphasizing carbohydrate-containing foods. Provided list of carbohydrates and recommended serving sizes of common foods. Reviewed pt's dietary recall and made recommendations that would help decrease blood glucose levels.  Discussed importance of controlled and consistent carbohydrate intake throughout the day. Provided examples of ways to balance meals/snacks and encouraged intake of high-fiber, whole grain complex carbohydrates. Provided "1800-Calorie 5-Day Sample Menus" from AND. Teach back method used. Pt plans to stop drinking chocolate milk, eat more vegetables, and decrease portion sizes of pasta.  Expect good compliance.  Body mass index is 34.85 kg/(m^2). Pt meets criteria for Obesity based on current BMI.  Current diet order is Carb Modified, patient is consuming approximately 75% of meals at this time. Labs and medications reviewed. No further nutrition interventions warranted at this time. RD contact information provided. If additional nutrition issues arise, please re-consult RD.  Pryor Ochoa RD, LDN Inpatient Clinical Dietitian Pager: [phone] After Hours Pager: [phone]

## 2014-04-06 NOTE — Progress Notes (Signed)
Discharge orders received. Pt for discharge home today. IV d/c'd. Pt given discharge instructions and prescriptions with verbalized understanding. Family in room to assist with discharge. Staff brought pt downstairs via wheelchair.   

## 2014-04-06 NOTE — Discharge Summary (Signed)
Patient to go home with a lifevest  This patient has been seen and I agree with the findings and treatment plan.  Kathryne Eriksson. Dahlia Bailiff, MD, [place] [phone] (pager) [phone] (direct pager) Trauma Surgeon

## 2014-04-06 NOTE — Progress Notes (Signed)
Subjective: Did not sleep well.  No SOB  Objective: Vital signs in last 24 hours: Temp:  [98.1 F (36.7 C)-99.2 F (37.3 C)] 99.2 F (37.3 C) (06/24 0544) Pulse Rate:  [60-76] 66 (06/24 0544) Resp:  [16-18] 18 (06/24 0544) BP: (125-151)/(59-76) 151/76 mmHg (06/24 0544) SpO2:  [91 %-100 %] 93 % (06/24 0544) Last BM Date: 04/03/14  Intake/Output from previous day: 06/23 0701 - 06/24 0700 In: 600 [P.O.:600] Out: -  Intake/Output this shift:    Medications Current Facility-Administered Medications  Medication Dose Route Frequency Provider Last Rate Last Dose  . acetaminophen (TYLENOL) tablet 325-650 mg  325-650 mg Oral Q6H PRN Emina Riebock, NP      . antiseptic oral rinse (BIOTENE) solution 15 mL  15 mL Mouth Rinse q12n4p Brand Males, MD   15 mL at 04/05/14 1713  . carvedilol (COREG) tablet 6.25 mg  6.25 mg Oral BID WC Lelon Perla, MD   6.25 mg at 04/05/14 1713  . chlorhexidine (PERIDEX) 0.12 % solution 15 mL  15 mL Mouth Rinse BID Brand Males, MD   15 mL at 04/05/14 0759  . insulin aspart (novoLOG) injection 0-15 Units  0-15 Units Subcutaneous TID WC Emina Riebock, NP   5 Units at 04/06/14 0650  . labetalol (NORMODYNE,TRANDATE) injection 10 mg  10 mg Intravenous Q2H PRN Marijean Heath, NP   10 mg at 04/02/14 0916  . levETIRAcetam (KEPPRA) tablet 500 mg  500 mg Oral BID Rande Lawman Rumbarger, RPH   500 mg at 04/05/14 2159  . levothyroxine (SYNTHROID, LEVOTHROID) tablet 100 mcg  100 mcg Oral QAC breakfast Megan Dort, PA-C   100 mcg at 04/05/14 2725  . linagliptin (TRADJENTA) tablet 5 mg  5 mg Oral Daily Lisette Abu, PA-C   5 mg at 04/05/14 1209  . lisinopril (PRINIVIL,ZESTRIL) tablet 5 mg  5 mg Oral Daily Lelon Perla, MD      . metFORMIN (GLUCOPHAGE) tablet 1,000 mg  1,000 mg Oral BID WC Lisette Abu, PA-C   1,000 mg at 04/05/14 1713  . morphine 2 MG/ML injection 2-4 mg  2-4 mg Intravenous Q2H PRN Zenovia Jarred, MD   2 mg at 04/03/14  0931  . multivitamin liquid 5 mL  5 mL Per Tube Daily Heather Cornelison Pitts, RD   5 mL at 04/05/14 0952  . oxyCODONE (Oxy IR/ROXICODONE) immediate release tablet 5-10 mg  5-10 mg Oral Q4H PRN Zenovia Jarred, MD   10 mg at 04/05/14 1102  . pantoprazole (PROTONIX) EC tablet 40 mg  40 mg Oral Daily Rande Lawman Rumbarger, RPH   40 mg at 04/05/14 3664  . pioglitazone (ACTOS) tablet 45 mg  45 mg Oral Daily Lisette Abu, PA-C   45 mg at 04/05/14 1209  . sodium chloride 0.9 % injection 3 mL  3 mL Intravenous Q12H Emina Riebock, NP   3 mL at 04/04/14 2133  . sodium chloride 0.9 % injection 3 mL  3 mL Intravenous PRN Erby Pian, NP        PE: General appearance: alert, cooperative and no distress Lungs: clear to auscultation bilaterally Heart: regular rate and rhythm, S1, S2 normal, no murmur, click, rub or gallop Extremities: No LEE Pulses: 2+ and symmetric Skin: Warm and dry Neurologic: Grossly normal  Lab Results:   Recent Labs  04/04/14 0300  WBC 6.2  HGB 12.0*  HCT 36.3*  PLT 123*   BMET  Recent Labs  04/04/14 0300  NA 139  K 3.5*  CL 101  CO2 25  GLUCOSE 154*  BUN 10  CREATININE 0.82  CALCIUM 8.5     Assessment/Plan 68 year old male was up on the roof of his deck 04/01/14 when he became disoriented. His wife witnessed him stumble around on the roof. He fell down, hitting his back on a broom. But did not fall off the roof. He had positive loss of consciousness. He was initially described as purple but began breathing again. He recalls both feeling disoriented and then regaining consciousness with EMS personnel attending to him. In ER he complained of back pain. He also noted he did fall down the night before getting up out of bed but attributed that to some leg cramps. He was found in ER to have traumatic brain injury with subarachnoid hemorrhage and subdural, L1 transverse process fracture,  Left rib fracture #9 and #11.   Principal Problem:  1  cardiomyopathy- etiology unclear. Given history of diabetes mellitus coronary artery disease needs to be excluded. However he had a subarachnoid hemorrhage at the time of his syncopal episode. Therefore anticoagulation would be contraindicated if PCI or coronary artery bypassing graft where necessary.  Neuro recommends waiting 3-4 weeks; repeat CT if cath needed in the first 1-2 weeks.  Carvedilol 6.25 mg by mouth twice a day and Lisinopril to 5 mg daily.  He has not received the increased ACE yet.  Today will be first dose.   2 Syncope- etiology unclear but extremely concerning for ventricular arrhythmia. Echocardiogram shows severely reduced LV function with ejection fraction of 30-35%. Review of telemetry from June 20 shows nonsustained ventricular tachycardia. Continue beta blocker.  Life vest prior to discharge. Proceed with cardiac catheterization when able. Revascularize as needed. Repeat echocardiogram 3 months after medication titration. If ejection fraction less than 35% would need ICD.  Lifevest was fit yesterday.  Sinus brady in the 50's on the monitor.  No NSVT in the last 24 hours.  Seen by EP.   Reemphasize no driving for 6 months.  He apparently thought Dr. Rayann Heman was kidding.  His wife will be driving him to work which he does not need to be doing for the next 2-3 weeks.   I also discussed weight monitoring and low sodium diet again.  3 diabetes mellitus  4 status post subarachnoid hemorrhage-per neurosurgery  5 Decreased TSH-possible euthyroid sick; T4 WNL T3 low.  Should recheck in a month or two after he recovers more. 6 Hypokalemia:  replace   LOS: 5 days    HAGER, BRYAN PA-C 04/06/2014 7:56 AM As above, patient seen and examined. No chest pain or dyspnea. Patient remains in sinus rhythm with no nonsustained ventricular tachycardia on telemetry. Plan as outlined previously. He will followup with me in approximately 2 weeks. We will arrange cardiac catheterization in  approximately 4 weeks. Revascularize as needed. Titrate medications as an outpatient. Repeat echocardiogram 3 months later. If ejection fraction less than 35% ICD. No driving for 6 months. Note catheterization has been delayed because of intracranial hemorrhage. He would not be a candidate for anticoagulants at this point until this improves. Patient can be discharged with his life vest from a cardiac standpoint. Kirk Ruths

## 2014-04-14 ENCOUNTER — Telehealth: Payer: Self-pay | Admitting: *Deleted

## 2014-04-14 NOTE — Telephone Encounter (Signed)
Left msg on triage requesting to speak with nurse. Pt was d/c from hosp a week ago has been c/o headaches.

## 2014-04-14 NOTE — Telephone Encounter (Signed)
Called wife back she stated husband was saying that he has a headache, but when he fell he did hit his head. Inform wife that the headache could be coming from when he hit his head. She said it not bearable he has been taking motrin. Advise her if he need to he can take the oxycodone that they rx, but they rather take motrin. Has appt on 7/9 for hosp f/u advise her to keep appt but if the headache get worse over the weekend to get eval from ER...Johny Chess

## 2014-04-20 ENCOUNTER — Ambulatory Visit: Payer: Managed Care, Other (non HMO) | Admitting: Internal Medicine

## 2014-04-21 ENCOUNTER — Ambulatory Visit (INDEPENDENT_AMBULATORY_CARE_PROVIDER_SITE_OTHER): Payer: Managed Care, Other (non HMO) | Admitting: Cardiology

## 2014-04-21 ENCOUNTER — Encounter: Payer: Self-pay | Admitting: Cardiology

## 2014-04-21 ENCOUNTER — Ambulatory Visit (INDEPENDENT_AMBULATORY_CARE_PROVIDER_SITE_OTHER): Payer: Managed Care, Other (non HMO) | Admitting: Internal Medicine

## 2014-04-21 ENCOUNTER — Encounter: Payer: Self-pay | Admitting: Internal Medicine

## 2014-04-21 ENCOUNTER — Other Ambulatory Visit (INDEPENDENT_AMBULATORY_CARE_PROVIDER_SITE_OTHER): Payer: Managed Care, Other (non HMO)

## 2014-04-21 VITALS — BP 92/60 | HR 67 | Temp 98.0°F | Ht 73.0 in | Wt 249.8 lb

## 2014-04-21 VITALS — BP 96/60 | HR 63 | Ht 73.0 in | Wt 249.9 lb

## 2014-04-21 DIAGNOSIS — R55 Syncope and collapse: Secondary | ICD-10-CM | POA: Diagnosis not present

## 2014-04-21 DIAGNOSIS — I10 Essential (primary) hypertension: Secondary | ICD-10-CM

## 2014-04-21 DIAGNOSIS — E039 Hypothyroidism, unspecified: Secondary | ICD-10-CM | POA: Diagnosis not present

## 2014-04-21 DIAGNOSIS — E119 Type 2 diabetes mellitus without complications: Secondary | ICD-10-CM

## 2014-04-21 DIAGNOSIS — E785 Hyperlipidemia, unspecified: Secondary | ICD-10-CM

## 2014-04-21 DIAGNOSIS — R561 Post traumatic seizures: Secondary | ICD-10-CM | POA: Diagnosis not present

## 2014-04-21 DIAGNOSIS — S065XAA Traumatic subdural hemorrhage with loss of consciousness status unknown, initial encounter: Secondary | ICD-10-CM

## 2014-04-21 DIAGNOSIS — S065X9A Traumatic subdural hemorrhage with loss of consciousness of unspecified duration, initial encounter: Secondary | ICD-10-CM

## 2014-04-21 DIAGNOSIS — I609 Nontraumatic subarachnoid hemorrhage, unspecified: Secondary | ICD-10-CM | POA: Diagnosis not present

## 2014-04-21 DIAGNOSIS — I714 Abdominal aortic aneurysm, without rupture, unspecified: Secondary | ICD-10-CM

## 2014-04-21 DIAGNOSIS — I428 Other cardiomyopathies: Secondary | ICD-10-CM

## 2014-04-21 DIAGNOSIS — IMO0001 Reserved for inherently not codable concepts without codable children: Secondary | ICD-10-CM

## 2014-04-21 DIAGNOSIS — I429 Cardiomyopathy, unspecified: Secondary | ICD-10-CM

## 2014-04-21 DIAGNOSIS — I6200 Nontraumatic subdural hemorrhage, unspecified: Secondary | ICD-10-CM

## 2014-04-21 DIAGNOSIS — E1165 Type 2 diabetes mellitus with hyperglycemia: Secondary | ICD-10-CM

## 2014-04-21 LAB — T4, FREE: Free T4: 0.94 ng/dL (ref 0.60–1.60)

## 2014-04-21 LAB — TSH: TSH: 5.09 u[IU]/mL — ABNORMAL HIGH (ref 0.35–4.50)

## 2014-04-21 MED ORDER — LISINOPRIL 2.5 MG PO TABS: 2.5000 mg | ORAL_TABLET | Freq: Every day | ORAL | Status: DC

## 2014-04-21 MED ORDER — GLUCOSE BLOOD VI STRP: ORAL_STRIP | Status: DC

## 2014-04-21 NOTE — Progress Notes (Signed)
HPI: FU syncope and cardiomyopathy. Admitted in June of 2015 following a syncopal episode on his roof. Event was complicated by subarachnoid and subdural hemorrhage as well as rib fractures; also with L1 transverse process fx. Echocardiogram showed an ejection fraction of 30-35%. Carotid Dopplers showed no significant obstruction. Chest CT showed no pulmonary embolus. Abdominal CT showed mild aneurysmal dilatation of the infrarenal aorta. Enzymes negative. NSVT noted on telemetry. There was concern about ventricular arrhythmia mediated syncope given reduced LV function. Patient was placed on cardiac medications. It was felt he would require cardiac catheterization but in the setting of subarachnoid/subdural hemorrhage anticoagulation could not be used if intervention felt to be necessary. It was therefore delayed. He was discharged with a life vest. Neurosurgery recommended delaying any anticoagulation for 4 weeks. Plan was for cardiac catheterization with revascularization as needed at a later date. Titration of medications and then repeat echocardiogram 3 months later. Ejection fraction less than 35% he would need an ICD. Since discharge, He denies dyspnea, chest pain or recurrent syncope. He note some fatigue and dizziness with standing.  Current Outpatient Prescriptions  Medication Sig Dispense Refill  . carvedilol (COREG) 6.25 MG tablet Take 1 tablet (6.25 mg total) by mouth 2 (two) times daily with a meal.  60 tablet  0  . glucose blood (ONE TOUCH TEST STRIPS) test strip Use as directed twice daily to check blood sugar.  Diagnosis code 250.00  100 each  11  . levETIRAcetam (KEPPRA) 500 MG tablet Take 1 tablet (500 mg total) by mouth 2 (two) times daily.  60 tablet  0  . levothyroxine (SYNTHROID, LEVOTHROID) 100 MCG tablet Take 1 tablet (100 mcg total) by mouth daily before breakfast.  60 tablet  0  . linagliptin (TRADJENTA) 5 MG TABS tablet Take 1 tablet (5 mg total) by mouth daily.  30  tablet  1  . lisinopril (PRINIVIL,ZESTRIL) 5 MG tablet Take 5 mg by mouth daily.      Marland Kitchen lovastatin (MEVACOR) 20 MG tablet Take 20 mg by mouth at bedtime.      . metFORMIN (GLUCOPHAGE) 1000 MG tablet Take 1,000 mg by mouth 2 (two) times daily with a meal.      . pioglitazone (ACTOS) 45 MG tablet Take 45 mg by mouth daily.       No current facility-administered medications for this visit.     Past Medical History  Diagnosis Date  . ALLERGIC RHINITIS 05/07/2007  . DEPRESSION 05/07/2007  . DIABETES MELLITUS, TYPE II 05/07/2007  . HYPERLIPIDEMIA 05/07/2007  . HYPOTHYROIDISM 05/07/2007  . SLEEP APNEA, OBSTRUCTIVE 05/07/2007  . COLONIC POLYPS, HX OF 12/07/2007  . DIVERTICULITIS, HX OF 05/07/2007  . DIVERTICULOSIS, COLON 12/07/2007  . ERECTILE DYSFUNCTION 05/07/2007  . NEPHROLITHIASIS, HX OF 12/07/2007  . HYPERTENSION 05/16/2008    pt denies  . Cardiomyopathy   . SDH (subdural hematoma)     Following syncopal episode  . SAH (subarachnoid hemorrhage)     Following syncopal episode    Past Surgical History  Procedure Laterality Date  . Colonoscopy    . Polypectomy      History   Social History  . Marital Status: Married    Spouse Name: N/A    Number of Children: 3  . Years of Education: N/A   Occupational History  . [phone] ([employer])   . [employer] based in [place] History Main Topics  . Smoking status: Former Smoker -- 1.00 packs/day for 30  years    Types: Cigarettes    Quit date: 10/15/1983  . Smokeless tobacco: Never Used  . Alcohol Use: 0.6 oz/week    1 Glasses of wine per week     Comment: social  . Drug Use: No  . Sexual Activity: Not on file   Other Topics Concern  . Not on file   Social History Narrative   3 daughters 67, 19, 13 and 2 grandchildren    ROS: no fevers or chills, productive cough, hemoptysis, dysphasia, odynophagia, melena, hematochezia, dysuria, hematuria, rash, seizure activity, orthopnea, PND, pedal edema, claudication. Remaining  systems are negative.  Physical Exam: Well-developed well-nourished in no acute distress.  Skin is warm and dry.  HEENT is normal.  Neck is supple.  Chest is clear to auscultation with normal expansion.  Cardiovascular exam is regular rate and rhythm.  Abdominal exam nontender or distended. No masses palpated. Extremities show no edema. neuro grossly intact

## 2014-04-21 NOTE — Assessment & Plan Note (Signed)
Mildly dilated aorta on recent ultrasound. Plan repeat study June 2016.

## 2014-04-21 NOTE — Assessment & Plan Note (Signed)
Continue ACE inhibitor and beta blocker. He is complaining of dizziness with standing. His blood pressure is borderline. Decrease lisinopril to 2.5 mg daily. I would like for him to be on low-dose ACE inhibitor and beta blocker if possible.

## 2014-04-21 NOTE — Patient Instructions (Signed)
Please continue all other medications as before, and refills have been done if requested.  Please have the pharmacy call with any other refills you may need.  Please continue your efforts at being more active, low cholesterol diet, and weight control.  You are otherwise up to date with prevention measures today.  Please keep your appointments with your specialists as you may have planned  Please return in 3 months, or sooner if needed, with Lab testing done 3-5 days before  

## 2014-04-21 NOTE — Assessment & Plan Note (Signed)
Continue present medications. 

## 2014-04-21 NOTE — Progress Notes (Signed)
Pre visit review using our clinic review tool, if applicable. No additional management support is needed unless otherwise documented below in the visit note. 

## 2014-04-21 NOTE — Assessment & Plan Note (Signed)
Repeat head CT one week. Plan as outlined under syncope.

## 2014-04-21 NOTE — Progress Notes (Signed)
Subjective:    Patient ID: Allen Myers, male    DOB: 1946/05/24, 68 y.o.   MRN: [id number]  HPI  Here to f/u, with wife post hospn,  Thyroid med adjusted in shop for abnromal tsh, with free t4 normal. Denies hyper or hypo thyroid symptoms such as voice, skin or hair change,  Pt denies chest pain, increased sob or doe, wheezing, orthopnea, PND, increased LE swelling, palpitations, dizziness or syncope.  Pt denies polydipsia, polyuria, new facial or extremity weakness or numbness.   Pt states overall good compliance with meds, has been trying to follow lower cholesterol, diabetic diet.  Does c/o ongoing fatigue, but denies signficant daytime hypersomnolence. CBG;s in past wk low to mid 100's.  Needs referral to NS for ongoing care. Does c/o ongoing fatigue, but denies signficant daytime hypersomnolence. Past Medical History  Diagnosis Date  . ALLERGIC RHINITIS 05/07/2007  . DEPRESSION 05/07/2007  . DIABETES MELLITUS, TYPE II 05/07/2007  . HYPERLIPIDEMIA 05/07/2007  . HYPOTHYROIDISM 05/07/2007  . SLEEP APNEA, OBSTRUCTIVE 05/07/2007  . COLONIC POLYPS, HX OF 12/07/2007  . DIVERTICULITIS, HX OF 05/07/2007  . DIVERTICULOSIS, COLON 12/07/2007  . ERECTILE DYSFUNCTION 05/07/2007  . NEPHROLITHIASIS, HX OF 12/07/2007  . HYPERTENSION 05/16/2008    pt denies  . Cardiomyopathy   . SDH (subdural hematoma)     Following syncopal episode  . SAH (subarachnoid hemorrhage)     Following syncopal episode   Past Surgical History  Procedure Laterality Date  . Colonoscopy    . Polypectomy      reports that he quit smoking about 30 years ago. His smoking use included Cigarettes. He has a 30 pack-year smoking history. He has never used smokeless tobacco. He reports that he drinks about .6 ounces of alcohol per week. He reports that he does not use illicit drugs. family history includes Cancer in his brother and cousin; Heart disease in his father and mother; Lung cancer in his father. There is no history of Colon  cancer, Rectal cancer, or Stomach cancer. No Known Allergies Current Outpatient Prescriptions on File Prior to Visit  Medication Sig Dispense Refill  . carvedilol (COREG) 6.25 MG tablet Take 1 tablet (6.25 mg total) by mouth 2 (two) times daily with a meal.  60 tablet  0  . levETIRAcetam (KEPPRA) 500 MG tablet Take 1 tablet (500 mg total) by mouth 2 (two) times daily.  60 tablet  0  . levothyroxine (SYNTHROID, LEVOTHROID) 100 MCG tablet Take 1 tablet (100 mcg total) by mouth daily before breakfast.  60 tablet  0  . linagliptin (TRADJENTA) 5 MG TABS tablet Take 1 tablet (5 mg total) by mouth daily.  30 tablet  1  . lovastatin (MEVACOR) 20 MG tablet Take 20 mg by mouth at bedtime.      . metFORMIN (GLUCOPHAGE) 1000 MG tablet Take 1,000 mg by mouth 2 (two) times daily with a meal.      . pioglitazone (ACTOS) 45 MG tablet Take 45 mg by mouth daily.      Marland Kitchen lisinopril (PRINIVIL,ZESTRIL) 2.5 MG tablet Take 1 tablet (2.5 mg total) by mouth daily.  30 tablet  12   No current facility-administered medications on file prior to visit.    Review of Systems  Constitutional: Negative for unusual diaphoresis or other sweats  HENT: Negative for ringing in ear Eyes: Negative for double vision or worsening visual disturbance.  Respiratory: Negative for choking and stridor.   Gastrointestinal: Negative for vomiting or other signifcant bowel change  Genitourinary: Negative for hematuria or decreased urine volume.  Skin: Negative for color change and worsening wound.  Neurological: Negative for tremors and numbness other than noted    Objective:   Physical Exam BP 92/60  Pulse 67  Temp(Src) 98 F (36.7 C) (Oral)  Ht 6\' 1"  (1.854 m)  Wt 249 lb 12 oz (113.286 kg)  BMI 32.96 kg/m2  SpO2 95% VS noted,  Constitutional: Pt appears well-developed, well-nourished.  HENT: Head: NCAT.  Right Ear: External ear normal.  Left Ear: External ear normal.  Eyes: . Pupils are equal, round, and reactive to light.  Conjunctivae and EOM are normal Neck: Normal range of motion. Neck supple.  Cardiovascular: Normal rate and regular rhythm.   Pulmonary/Chest: Effort normal and breath sounds normal.  Abd:  Soft, NT, ND, + BS Neurological: Pt is alert.  motor grossly intact Skin: Skin is warm. No rash Psychiatric: Pt behavior is normal. No agitation.     Assessment & Plan:

## 2014-04-21 NOTE — Assessment & Plan Note (Signed)
Continue statin. 

## 2014-04-21 NOTE — Patient Instructions (Signed)
Your physician recommends that you schedule a follow-up appointment in: [place]  Non-Cardiac CT scanning, (CAT scanning), is a noninvasive, special x-ray that produces cross-sectional images of the body using x-rays and a computer. CT scans help physicians diagnose and treat medical conditions. For some CT exams, a contrast material is used to enhance visibility in the area of the body being studied. CT scans provide greater clarity and reveal more details than regular x-ray exams. CT OF THE HEAD WO CONTRAST-Thursday NEXT WEEK AT THE [address] OFFICE-F/U SUBDURAL HEMATOMA    DECREASE LISINOPRIL TO 2.5 MG ONCE DAILY

## 2014-04-21 NOTE — Assessment & Plan Note (Signed)
Etiology of previous syncopal episode unclear but concerning for ventricular arrhythmia. His ejection fraction is 30-35%. He was noted to have nonsustained ventricular tachycardia on telemetry. He needs cardiac catheterization to exclude coronary disease as a cause of his cardiomyopathy with revascularization as needed. This was delayed in the hospital because of subarachnoid/subdural hematoma that occurred at the time of his syncopal episode. I discussed this with Dr. Ronnald Ramp of neurosurgery today. In one week which will be 4 weeks from his previous event we will plan repeat noncontrast head CT. If the subdural hematoma has resolved or is smaller we will proceed with cardiac catheterization and Dr. Ronnald Ramp felt we could anticoagulate at that point. It is the same size or increasing he will need neurosurgical evaluation prior to any cardiac procedure. Life vest is in place. Once he undergoes cardiac catheterization plus minus revascularization and has been on cardiac medications for 3 months we will repeat his echocardiogram. If ejection fraction less than 35% he will require ICD at that time. Dr. Rayann Heman saw in the hospital.

## 2014-04-24 NOTE — Assessment & Plan Note (Signed)
Not well recently controlled, stable symptomatically overall by history and exam, and pt to continue same treatment,  to f/u any worsening symptoms or concerns , for f/u lab

## 2014-04-24 NOTE — Assessment & Plan Note (Signed)
stable overall by history and exam, recent med change noted and pt to continue medical treatment as is for now, f/ u lab next visit,  to f/u any worsening symptoms or concerns

## 2014-04-24 NOTE — Assessment & Plan Note (Addendum)
stable overall by history and exam, recent data reviewed with pt, and pt to continue medical treatment as before,  to f/u any worsening symptoms or concerns BP Readings from Last 3 Encounters:  04/21/14 96/60  04/21/14 92/60  04/06/14 127/76  with borderline low BP, ? Need decr coreg, has f/u card July 9

## 2014-04-24 NOTE — Assessment & Plan Note (Signed)
For NS referral, cont same tx,  to f/u any worsening symptoms or concerns

## 2014-04-25 ENCOUNTER — Telehealth: Payer: Self-pay | Admitting: *Deleted

## 2014-04-25 NOTE — Telephone Encounter (Signed)
[facility] Triage Call Report Triage Record Num: [id number] Operator: Virgina Organ Patient Name: Allen Myers Call Date & Time: 04/09/2014 9:38:30PM Patient Phone: PCP: Webb Silversmith Patient Gender: Male PCP Fax : Patient DOB: November 04, 1945 Practice Name: [facility] Reason for Call: Caller: Diana/Spouse; PCP: Webb Silversmith; CB#: [phone]; Call regarding Rash/Hives; Noted on trunk area and starting to travel under his arm. First noticed today, 6/27, and getting progessively worse. Rash is smooth and look like large red circles. Currently afebrile. Not itchy. Pt just recently started taking Levetiracetam (generic Keppra) 500mg  BID last week while in the hospital along with a few other new meds. All emergent sxs ruled out per Rash Guideline except for "New onset of rash after beginning new prescribed medication." Dr. Regis Bill notified and advised ok to try Benadryl 25mg  q6h, monitor for worsening rash and call Neurologist (Dr. Kathyrn Sheriff) for further advice. Advised to take to ED if rash worsens depsite Benadryl. Caller given information and agrees to plan. Protocol(s) Used: Rash Recommended Outcome per Protocol: Call Provider within 4 Hours Reason for Outcome: New onset of rash after beginning new prescribed, nonprescribed, or alternative/complementary medication Care Advice: ~ 04/09/2014 10:09:51PM Page 1 of 1 [id number]

## 2014-04-27 ENCOUNTER — Encounter: Payer: Self-pay | Admitting: Cardiology

## 2014-04-27 NOTE — Telephone Encounter (Signed)
Please call,confused about who her husband is suppose to see for his neuro problems.

## 2014-04-27 NOTE — Telephone Encounter (Signed)
This encounter was created in error - please disregard.

## 2014-04-28 ENCOUNTER — Telehealth: Payer: Self-pay | Admitting: *Deleted

## 2014-04-28 ENCOUNTER — Other Ambulatory Visit: Payer: Self-pay | Admitting: *Deleted

## 2014-04-28 ENCOUNTER — Ambulatory Visit: Payer: Managed Care, Other (non HMO) | Admitting: Dietician

## 2014-04-28 ENCOUNTER — Ambulatory Visit (INDEPENDENT_AMBULATORY_CARE_PROVIDER_SITE_OTHER)
Admission: RE | Admit: 2014-04-28 | Discharge: 2014-04-28 | Disposition: A | Discharge status: Home / Self Care | Payer: Managed Care, Other (non HMO) | Source: Ambulatory Visit | Attending: Cardiology | Admitting: Cardiology

## 2014-04-28 ENCOUNTER — Encounter: Payer: Self-pay | Admitting: Cardiology

## 2014-04-28 DIAGNOSIS — I6200 Nontraumatic subdural hemorrhage, unspecified: Secondary | ICD-10-CM

## 2014-04-28 DIAGNOSIS — S065XAA Traumatic subdural hemorrhage with loss of consciousness status unknown, initial encounter: Secondary | ICD-10-CM

## 2014-04-28 DIAGNOSIS — S065X9A Traumatic subdural hemorrhage with loss of consciousness of unspecified duration, initial encounter: Secondary | ICD-10-CM

## 2014-04-28 MED ORDER — ASPIRIN EC 81 MG PO TBEC: 81.0000 mg | DELAYED_RELEASE_TABLET | Freq: Every day | ORAL | Status: DC

## 2014-04-28 NOTE — Telephone Encounter (Signed)
This encounter was created in error - please disregard.

## 2014-04-28 NOTE — Telephone Encounter (Signed)
Left smg on triage stating md put a referral in to see Neurologist. Does not want to go to [place] [facility] want to get set-up with the doctors that was taking care of husband Dr. Armida Sans & ?. Per chart [place] had sent referral information. Dr. Armida Sans does not see outpatient in [place]. Allen Myers is going to call wife to inform her status with referral.../lmb

## 2014-04-28 NOTE — Telephone Encounter (Signed)
Wants to know if his CT scan is back from this morning? She just wanted to know,scheduled to go to the beach Saturday morning.

## 2014-05-03 ENCOUNTER — Other Ambulatory Visit: Payer: Self-pay

## 2014-05-03 MED ORDER — CARVEDILOL 6.25 MG PO TABS: 6.2500 mg | ORAL_TABLET | Freq: Two times a day (BID) | ORAL | Status: DC

## 2014-05-09 ENCOUNTER — Encounter (HOSPITAL_COMMUNITY): Payer: Self-pay | Admitting: Pharmacy Technician

## 2014-05-09 ENCOUNTER — Telehealth: Payer: Self-pay | Admitting: Cardiology

## 2014-05-09 ENCOUNTER — Encounter: Payer: Self-pay | Admitting: *Deleted

## 2014-05-09 DIAGNOSIS — I214 Non-ST elevation (NSTEMI) myocardial infarction: Secondary | ICD-10-CM

## 2014-05-09 LAB — PROTIME-INR
INR: 0.96 (ref <1.50)
Prothrombin Time: 12.8 seconds (ref 11.6–15.2)

## 2014-05-09 LAB — CBC
HEMATOCRIT: 42.7 % (ref 39.0–52.0)
HEMOGLOBIN: 14.2 g/dL (ref 13.0–17.0)
MCH: 28.6 pg (ref 26.0–34.0)
MCHC: 33.3 g/dL (ref 30.0–36.0)
MCV: 86.1 fL (ref 78.0–100.0)
Platelets: 135 10*3/uL — ABNORMAL LOW (ref 150–400)
RBC: 4.96 MIL/uL (ref 4.22–5.81)
RDW: 15.8 % — ABNORMAL HIGH (ref 11.5–15.5)
WBC: 6.5 10*3/uL (ref 4.0–10.5)

## 2014-05-09 NOTE — Telephone Encounter (Signed)
Spoke with pt, he is scheduled for a LC&P 05-11-14 with dr Tamala Julian. Pt will come by the office for lab work and to pick up instruction letter. Instructions discussed over the phone

## 2014-05-09 NOTE — Telephone Encounter (Signed)
Please call,suppose to be getting Cath scheduled.

## 2014-05-10 ENCOUNTER — Other Ambulatory Visit: Payer: Self-pay | Admitting: Cardiology

## 2014-05-10 DIAGNOSIS — I428 Other cardiomyopathies: Secondary | ICD-10-CM

## 2014-05-10 LAB — BASIC METABOLIC PANEL WITH GFR
BUN: 10 mg/dL (ref 6–23)
CHLORIDE: 105 meq/L (ref 96–112)
CO2: 26 meq/L (ref 19–32)
CREATININE: 0.83 mg/dL (ref 0.50–1.35)
Calcium: 8.9 mg/dL (ref 8.4–10.5)
GFR, Est African American: >89 mL/min
GFR, Est Non African American: >89 mL/min
Glucose, Bld: 124 mg/dL — ABNORMAL HIGH (ref 70–99)
Potassium: 4.7 mEq/L (ref 3.5–5.3)
SODIUM: 141 meq/L (ref 135–145)

## 2014-05-11 ENCOUNTER — Encounter (HOSPITAL_COMMUNITY)
Admission: RE | Disposition: A | Discharge status: Home / Self Care | Payer: Self-pay | Source: Ambulatory Visit | Attending: Interventional Cardiology

## 2014-05-11 ENCOUNTER — Encounter (HOSPITAL_COMMUNITY): Payer: Self-pay | Admitting: General Practice

## 2014-05-11 ENCOUNTER — Inpatient Hospital Stay (HOSPITAL_COMMUNITY)
Admission: RE | Admit: 2014-05-11 | Discharge: 2014-05-12 | DRG: 247 | Disposition: A | Discharge status: Home / Self Care | Payer: Managed Care, Other (non HMO) | Source: Ambulatory Visit | Attending: Interventional Cardiology | Admitting: Interventional Cardiology

## 2014-05-11 DIAGNOSIS — Y9210 Unspecified residential institution as the place of occurrence of the external cause: Secondary | ICD-10-CM | POA: Diagnosis not present

## 2014-05-11 DIAGNOSIS — E669 Obesity, unspecified: Secondary | ICD-10-CM | POA: Diagnosis present

## 2014-05-11 DIAGNOSIS — I2584 Coronary atherosclerosis due to calcified coronary lesion: Secondary | ICD-10-CM | POA: Diagnosis present

## 2014-05-11 DIAGNOSIS — I214 Non-ST elevation (NSTEMI) myocardial infarction: Secondary | ICD-10-CM

## 2014-05-11 DIAGNOSIS — I498 Other specified cardiac arrhythmias: Secondary | ICD-10-CM | POA: Diagnosis present

## 2014-05-11 DIAGNOSIS — I119 Hypertensive heart disease without heart failure: Secondary | ICD-10-CM | POA: Diagnosis present

## 2014-05-11 DIAGNOSIS — Z79899 Other long term (current) drug therapy: Secondary | ICD-10-CM

## 2014-05-11 DIAGNOSIS — IMO0001 Reserved for inherently not codable concepts without codable children: Secondary | ICD-10-CM | POA: Diagnosis not present

## 2014-05-11 DIAGNOSIS — E039 Hypothyroidism, unspecified: Secondary | ICD-10-CM | POA: Diagnosis present

## 2014-05-11 DIAGNOSIS — I10 Essential (primary) hypertension: Secondary | ICD-10-CM

## 2014-05-11 DIAGNOSIS — Z955 Presence of coronary angioplasty implant and graft: Secondary | ICD-10-CM

## 2014-05-11 DIAGNOSIS — Z87891 Personal history of nicotine dependence: Secondary | ICD-10-CM

## 2014-05-11 DIAGNOSIS — I428 Other cardiomyopathies: Secondary | ICD-10-CM

## 2014-05-11 DIAGNOSIS — E785 Hyperlipidemia, unspecified: Secondary | ICD-10-CM | POA: Diagnosis present

## 2014-05-11 DIAGNOSIS — E66811 Obesity, class 1: Secondary | ICD-10-CM | POA: Diagnosis present

## 2014-05-11 DIAGNOSIS — G4733 Obstructive sleep apnea (adult) (pediatric): Secondary | ICD-10-CM | POA: Diagnosis present

## 2014-05-11 DIAGNOSIS — I2510 Atherosclerotic heart disease of native coronary artery without angina pectoris: Secondary | ICD-10-CM | POA: Diagnosis not present

## 2014-05-11 DIAGNOSIS — Y849 Medical procedure, unspecified as the cause of abnormal reaction of the patient, or of later complication, without mention of misadventure at the time of the procedure: Secondary | ICD-10-CM | POA: Diagnosis not present

## 2014-05-11 DIAGNOSIS — T82897A Other specified complication of cardiac prosthetic devices, implants and grafts, initial encounter: Secondary | ICD-10-CM | POA: Diagnosis not present

## 2014-05-11 DIAGNOSIS — I429 Cardiomyopathy, unspecified: Secondary | ICD-10-CM

## 2014-05-11 DIAGNOSIS — E119 Type 2 diabetes mellitus without complications: Secondary | ICD-10-CM | POA: Diagnosis present

## 2014-05-11 DIAGNOSIS — Z6832 Body mass index (BMI) 32.0-32.9, adult: Secondary | ICD-10-CM

## 2014-05-11 HISTORY — DX: Calculus of kidney: N20.0

## 2014-05-11 HISTORY — DX: Type 2 diabetes mellitus without complications: E11.9

## 2014-05-11 HISTORY — PX: FRACTIONAL FLOW RESERVE WIRE: SHX5839

## 2014-05-11 HISTORY — PX: LEFT HEART CATHETERIZATION WITH CORONARY ANGIOGRAM: SHX5451

## 2014-05-11 HISTORY — DX: Unspecified convulsions: R56.9

## 2014-05-11 HISTORY — PX: CORONARY ANGIOPLASTY WITH STENT PLACEMENT: SHX49

## 2014-05-11 LAB — GLUCOSE, CAPILLARY
GLUCOSE-CAPILLARY: 171 mg/dL — AB (ref 70–99)
Glucose-Capillary: 177 mg/dL — ABNORMAL HIGH (ref 70–99)
Glucose-Capillary: 178 mg/dL — ABNORMAL HIGH (ref 70–99)
Glucose-Capillary: 180 mg/dL — ABNORMAL HIGH (ref 70–99)

## 2014-05-11 LAB — TROPONIN I: Troponin I: <0.3 ng/mL (ref <0.30)

## 2014-05-11 LAB — POCT ACTIVATED CLOTTING TIME
ACTIVATED CLOTTING TIME: 298 s
Activated Clotting Time: 287 seconds

## 2014-05-11 SURGERY — LEFT HEART CATHETERIZATION WITH CORONARY ANGIOGRAM
Anesthesia: LOCAL

## 2014-05-11 MED ORDER — SODIUM CHLORIDE 0.9 % IV SOLN: 250.0000 mL | INTRAVENOUS | Status: DC | PRN

## 2014-05-11 MED ORDER — NITROGLYCERIN 0.2 MG/ML ON CALL CATH LAB
INTRAVENOUS | Status: AC
  Filled 2014-05-11: qty 1

## 2014-05-11 MED ORDER — LINAGLIPTIN 5 MG PO TABS
5.0000 mg | ORAL_TABLET | Freq: Every day | ORAL | Status: DC
  Administered 2014-05-12: 09:00:00 5 mg via ORAL
  Filled 2014-05-11: qty 1

## 2014-05-11 MED ORDER — LEVETIRACETAM 500 MG PO TABS
500.0000 mg | ORAL_TABLET | Freq: Two times a day (BID) | ORAL | Status: DC
  Administered 2014-05-11 – 2014-05-12 (×2): 500 mg via ORAL
  Filled 2014-05-11 (×3): qty 1

## 2014-05-11 MED ORDER — HEPARIN (PORCINE) IN NACL 2-0.9 UNIT/ML-% IJ SOLN
INTRAMUSCULAR | Status: AC
  Filled 2014-05-11: qty 1000

## 2014-05-11 MED ORDER — VERAPAMIL HCL 2.5 MG/ML IV SOLN
INTRAVENOUS | Status: AC
  Filled 2014-05-11: qty 4

## 2014-05-11 MED ORDER — MIDAZOLAM HCL 2 MG/2ML IJ SOLN
INTRAMUSCULAR | Status: AC
Start: 2014-05-11 — End: 2014-05-11
  Filled 2014-05-11: qty 2

## 2014-05-11 MED ORDER — LIDOCAINE HCL (PF) 1 % IJ SOLN
INTRAMUSCULAR | Status: AC
Start: 2014-05-11 — End: 2014-05-11
  Filled 2014-05-11: qty 30

## 2014-05-11 MED ORDER — ACETAMINOPHEN 325 MG PO TABS
650.0000 mg | ORAL_TABLET | ORAL | Status: DC | PRN
  Administered 2014-05-11: 650 mg via ORAL
  Filled 2014-05-11: qty 2

## 2014-05-11 MED ORDER — NITROGLYCERIN IN D5W 200-5 MCG/ML-% IV SOLN
INTRAVENOUS | Status: AC
  Filled 2014-05-11: qty 250

## 2014-05-11 MED ORDER — HEPARIN SODIUM (PORCINE) 1000 UNIT/ML IJ SOLN
INTRAMUSCULAR | Status: AC
Start: 2014-05-11 — End: 2014-05-11
  Filled 2014-05-11: qty 1

## 2014-05-11 MED ORDER — MIDAZOLAM HCL 2 MG/2ML IJ SOLN
INTRAMUSCULAR | Status: AC
  Filled 2014-05-11: qty 2

## 2014-05-11 MED ORDER — TICAGRELOR 90 MG PO TABS
90.0000 mg | ORAL_TABLET | Freq: Two times a day (BID) | ORAL | Status: DC
  Administered 2014-05-11 – 2014-05-12 (×2): 90 mg via ORAL
  Filled 2014-05-11 (×3): qty 1

## 2014-05-11 MED ORDER — CARVEDILOL 6.25 MG PO TABS
6.2500 mg | ORAL_TABLET | Freq: Two times a day (BID) | ORAL | Status: DC
  Administered 2014-05-11 – 2014-05-12 (×2): 6.25 mg via ORAL
  Filled 2014-05-11 (×4): qty 1

## 2014-05-11 MED ORDER — SIMVASTATIN 20 MG PO TABS
20.0000 mg | ORAL_TABLET | Freq: Every day | ORAL | Status: DC
  Administered 2014-05-11: 20 mg via ORAL
  Filled 2014-05-11 (×2): qty 1

## 2014-05-11 MED ORDER — SODIUM CHLORIDE 0.9 % IV SOLN
1.0000 mL/kg/h | INTRAVENOUS | Status: DC
  Administered 2014-05-11: 1 mL/kg/h via INTRAVENOUS

## 2014-05-11 MED ORDER — TICAGRELOR 90 MG PO TABS
ORAL_TABLET | ORAL | Status: AC
  Filled 2014-05-11: qty 2

## 2014-05-11 MED ORDER — LISINOPRIL 2.5 MG PO TABS
2.5000 mg | ORAL_TABLET | Freq: Every day | ORAL | Status: DC
  Administered 2014-05-12: 2.5 mg via ORAL
  Filled 2014-05-11: qty 1

## 2014-05-11 MED ORDER — ASPIRIN 81 MG PO CHEW: 81.0000 mg | CHEWABLE_TABLET | ORAL | Status: DC

## 2014-05-11 MED ORDER — SODIUM CHLORIDE 0.9 % IJ SOLN: 3.0000 mL | Freq: Two times a day (BID) | INTRAMUSCULAR | Status: DC

## 2014-05-11 MED ORDER — ASPIRIN EC 81 MG PO TBEC
81.0000 mg | DELAYED_RELEASE_TABLET | Freq: Every day | ORAL | Status: DC
  Administered 2014-05-12: 81 mg via ORAL
  Filled 2014-05-11: qty 1

## 2014-05-11 MED ORDER — HEPARIN SODIUM (PORCINE) 1000 UNIT/ML IJ SOLN
INTRAMUSCULAR | Status: AC
  Filled 2014-05-11: qty 1

## 2014-05-11 MED ORDER — INSULIN ASPART 100 UNIT/ML ~~LOC~~ SOLN: 0.0000 [IU] | Freq: Every day | SUBCUTANEOUS | Status: DC

## 2014-05-11 MED ORDER — ONDANSETRON HCL 4 MG/2ML IJ SOLN
4.0000 mg | Freq: Four times a day (QID) | INTRAMUSCULAR | Status: DC | PRN
  Administered 2014-05-11: 4 mg via INTRAVENOUS
  Filled 2014-05-11 (×2): qty 2

## 2014-05-11 MED ORDER — FENTANYL CITRATE 0.05 MG/ML IJ SOLN
INTRAMUSCULAR | Status: AC
  Filled 2014-05-11: qty 2

## 2014-05-11 MED ORDER — PIOGLITAZONE HCL 45 MG PO TABS
45.0000 mg | ORAL_TABLET | Freq: Every day | ORAL | Status: DC
  Administered 2014-05-12: 09:00:00 45 mg via ORAL
  Filled 2014-05-11: qty 1

## 2014-05-11 MED ORDER — LEVOTHYROXINE SODIUM 100 MCG PO TABS
100.0000 ug | ORAL_TABLET | Freq: Every day | ORAL | Status: DC
  Administered 2014-05-12: 06:00:00 100 ug via ORAL
  Filled 2014-05-11 (×2): qty 1

## 2014-05-11 MED ORDER — VERAPAMIL HCL 2.5 MG/ML IV SOLN
INTRAVENOUS | Status: AC
  Filled 2014-05-11: qty 2

## 2014-05-11 MED ORDER — ADENOSINE 12 MG/4ML IV SOLN
16.0000 mL | Freq: Once | INTRAVENOUS | Status: DC
  Filled 2014-05-11: qty 16

## 2014-05-11 MED ORDER — INSULIN ASPART 100 UNIT/ML ~~LOC~~ SOLN
0.0000 [IU] | Freq: Three times a day (TID) | SUBCUTANEOUS | Status: DC
  Administered 2014-05-12: 3 [IU] via SUBCUTANEOUS

## 2014-05-11 MED ORDER — NITROGLYCERIN IN D5W 200-5 MCG/ML-% IV SOLN: 30.0000 ug/min | INTRAVENOUS | Status: DC

## 2014-05-11 MED ORDER — SODIUM CHLORIDE 0.9 % IJ SOLN: 3.0000 mL | INTRAMUSCULAR | Status: DC | PRN

## 2014-05-11 NOTE — CV Procedure (Signed)
Left Heart Catheterization with Coronary Angiography and PCI Report  Allen Myers  68 y.o.  male 1946-08-21  Procedure Date: 05/11/2014 Referring Physician: Kirk Ruths, M.D. Primary Cardiologist:: Kirk Ruths, M.D.  INDICATIONS: Syncope with reduced LV function. The study is been ordered by the primary cardiologist to exclude significant coronary disease that may suggest an ischemic origin for transient arrhythmia resulting in syncope.  PROCEDURE: 1. Left heart catheterization; 2. Coronary angiography; 3. Left ventriculography; 4. FFR; 5. DES distal RCA  CONSENT:  The risks, benefits, and details of the procedure were explained in detail to the patient. Risks including death, stroke, heart attack, kidney injury, allergy, limb ischemia, bleeding and radiation injury were discussed.  The patient verbalized understanding and wanted to proceed.  Informed written consent was obtained.  PROCEDURE TECHNIQUE:  After Xylocaine anesthesia a 5 French Slender sheath was placed in the right radial artery with an angiocath and the modified Seldinger technique.  Coronary angiography was done using a 5 F JR 4 and JL 3.5 catheter.  Left ventriculography was done using the JR 4 catheter and hand injection.   Angiography demonstrated a high-grade mid to distal RCA, greater than 90%. The distal territory is large. The concern is that the patient's syncope was arrhythmia related to the tight RCA lesion. He also has an intermediate stenosis in the left circumflex, 50-60%. This is not a hemodynamically significant lesion.  After additional IV heparin an FFR wire was placed distal to the stenosis. Distal bed vasodilatation caused by contrast injection resulted in an FFR of 0.77. Adenosine was not infused.  Using the wire for the FFR system, we redilated with a 3.0 x 15 balloon. We then positioned and deployed a 3.5 x 24 mm Promus Premier. 2 inflations were performed. We then post dilated with a 4.0 x  20 mm long emerge to 12 atmospheres. 2 balloon inflations were performed.  Post successful deployment we noted reduced TIMI grade flow from III  to II . TIMI grade improved back to 3 with intracoronary nitroglycerin and intracoronary verapamil. Bilateral chest pressure and left arm tightness gradually resolved. IV nitroglycerin was started.  The procedure was done with heparin with multiple doses given to get an ACT around 300. Brilinta 180 mg was administered before wire manipulation.    CONTRAST:  Total of 200 cc.  COMPLICATIONS:  Slow flow/no reflow treated successfully with intracoronary nitroglycerin and verapamil   HEMODYNAMICS:  Aortic pressure 124/70 mmHg; LV pressure 125/11 mmHg; LVEDP 17 mmHg  ANGIOGRAPHIC DATA:   The left main coronary artery is widely patent.  The left anterior descending artery is heavily calcified. Widely patent but with multiple irregularities throughout the mid segment with up to 40-50% narrowing.  The left circumflex artery is is large in distribution. He gives origin to 4 obtuse marginal branches. The proximal to mid vessel contains an eccentric 50-70% narrowing.  The ramus intermedius branch is widely patent.  The right coronary artery is dominant. Gives origin to a large PDA. A small left ventricular branches arise distally. The distal vessel contains a segmental 95% stenosis.  PCI RESULTS: 1. FFR 0.77 after placement of the flow wire and following coronary contrast injection. Adenosine was not required to provoke a significant value.  2. Successful PTCA and stenting of the distal RCA from 95% segment minimal stenosis to 0% with TIMI grade 3 flow. Post dilated to 4.0 mm in diameter.  LEFT VENTRICULOGRAM:  Left ventricular angiogram was done in the 30 RAO projection  and revealed a 50% overall function without focal wall motion abnormality   IMPRESSIONS:  1. Severe distal RCA stenosis, angiographically 95% and positive FFR of 0.77 with provocation only  by contrast injection. Adenosine was not required 2. Successful stenting of the distal RCA from 95% segmental narrowing to 0% with TIMI grade 3 flow. 3. The procedure was complicated by transient no flow related to thrombus contained within the region of angioplasty. This was treated pharmacologically with return of TIMI grade 3 flow and gradual resolution of chest discomfort and left arm discomfort 4. Moderate proximal to mid circumflex. Not felt to be hemodynamically significant. There is multiple luminal are alert is in the mid LAD 5. Overall low normal LV systolic function with an EF of 50%. This is improved compared to the EF post syncope in June.   RECOMMENDATION:  Will get cardiac markers in a.m. Assuming no dramatic increase in troponin, he would be eligible for discharge on aspirin and Brilinta.Marland Kitchen

## 2014-05-11 NOTE — H&P (View-Only) (Signed)
HPI: FU syncope and cardiomyopathy. Admitted in June of 2015 following a syncopal episode on his roof. Event was complicated by subarachnoid and subdural hemorrhage as well as rib fractures; also with L1 transverse process fx. Echocardiogram showed an ejection fraction of 30-35%. Carotid Dopplers showed no significant obstruction. Chest CT showed no pulmonary embolus. Abdominal CT showed mild aneurysmal dilatation of the infrarenal aorta. Enzymes negative. NSVT noted on telemetry. There was concern about ventricular arrhythmia mediated syncope given reduced LV function. Patient was placed on cardiac medications. It was felt he would require cardiac catheterization but in the setting of subarachnoid/subdural hemorrhage anticoagulation could not be used if intervention felt to be necessary. It was therefore delayed. He was discharged with a life vest. Neurosurgery recommended delaying any anticoagulation for 4 weeks. Plan was for cardiac catheterization with revascularization as needed at a later date. Titration of medications and then repeat echocardiogram 3 months later. Ejection fraction less than 35% he would need an ICD. Since discharge, He denies dyspnea, chest pain or recurrent syncope. He note some fatigue and dizziness with standing.  Current Outpatient Prescriptions  Medication Sig Dispense Refill  . carvedilol (COREG) 6.25 MG tablet Take 1 tablet (6.25 mg total) by mouth 2 (two) times daily with a meal.  60 tablet  0  . glucose blood (ONE TOUCH TEST STRIPS) test strip Use as directed twice daily to check blood sugar.  Diagnosis code 250.00  100 each  11  . levETIRAcetam (KEPPRA) 500 MG tablet Take 1 tablet (500 mg total) by mouth 2 (two) times daily.  60 tablet  0  . levothyroxine (SYNTHROID, LEVOTHROID) 100 MCG tablet Take 1 tablet (100 mcg total) by mouth daily before breakfast.  60 tablet  0  . linagliptin (TRADJENTA) 5 MG TABS tablet Take 1 tablet (5 mg total) by mouth daily.  30  tablet  1  . lisinopril (PRINIVIL,ZESTRIL) 5 MG tablet Take 5 mg by mouth daily.      Marland Kitchen lovastatin (MEVACOR) 20 MG tablet Take 20 mg by mouth at bedtime.      . metFORMIN (GLUCOPHAGE) 1000 MG tablet Take 1,000 mg by mouth 2 (two) times daily with a meal.      . pioglitazone (ACTOS) 45 MG tablet Take 45 mg by mouth daily.       No current facility-administered medications for this visit.     Past Medical History  Diagnosis Date  . ALLERGIC RHINITIS 05/07/2007  . DEPRESSION 05/07/2007  . DIABETES MELLITUS, TYPE II 05/07/2007  . HYPERLIPIDEMIA 05/07/2007  . HYPOTHYROIDISM 05/07/2007  . SLEEP APNEA, OBSTRUCTIVE 05/07/2007  . COLONIC POLYPS, HX OF 12/07/2007  . DIVERTICULITIS, HX OF 05/07/2007  . DIVERTICULOSIS, COLON 12/07/2007  . ERECTILE DYSFUNCTION 05/07/2007  . NEPHROLITHIASIS, HX OF 12/07/2007  . HYPERTENSION 05/16/2008    pt denies  . Cardiomyopathy   . SDH (subdural hematoma)     Following syncopal episode  . SAH (subarachnoid hemorrhage)     Following syncopal episode    Past Surgical History  Procedure Laterality Date  . Colonoscopy    . Polypectomy      History   Social History  . Marital Status: Married    Spouse Name: N/A    Number of Children: 3  . Years of Education: N/A   Occupational History  . [phone] ([employer])   . [employer] based in [place] History Main Topics  . Smoking status: Former Smoker -- 1.00 packs/day for 30  years    Types: Cigarettes    Quit date: 10/15/1983  . Smokeless tobacco: Never Used  . Alcohol Use: 0.6 oz/week    1 Glasses of wine per week     Comment: social  . Drug Use: No  . Sexual Activity: Not on file   Other Topics Concern  . Not on file   Social History Narrative   3 daughters 84, 44, 41 and 2 grandchildren    ROS: no fevers or chills, productive cough, hemoptysis, dysphasia, odynophagia, melena, hematochezia, dysuria, hematuria, rash, seizure activity, orthopnea, PND, pedal edema, claudication. Remaining  systems are negative.  Physical Exam: Well-developed well-nourished in no acute distress.  Skin is warm and dry.  HEENT is normal.  Neck is supple.  Chest is clear to auscultation with normal expansion.  Cardiovascular exam is regular rate and rhythm.  Abdominal exam nontender or distended. No masses palpated. Extremities show no edema. neuro grossly intact

## 2014-05-11 NOTE — Interval H&P Note (Signed)
Cath Lab Visit (complete for each Cath Lab visit)  Clinical Evaluation Leading to the Procedure:   ACS: No.  Non-ACS:    Anginal Classification: No Symptoms  Anti-ischemic medical therapy: No Therapy  Non-Invasive Test Results: No non-invasive testing performed  Prior CABG: No previous CABG      History and Physical Interval Note:  05/11/2014 10:42 AM  Allen Myers  has presented today for surgery, with the diagnosis of svt  The various methods of treatment have been discussed with the patient and family. After consideration of risks, benefits and other options for treatment, the patient has consented to  Procedure(s): LEFT HEART CATHETERIZATION WITH CORONARY ANGIOGRAM (N/A) as a surgical intervention .  The patient's history has been reviewed, patient examined, no change in status, stable for surgery.  I have reviewed the patient's chart and labs.  Questions were answered to the patient's satisfaction.     [name]

## 2014-05-11 NOTE — Progress Notes (Signed)
TR BAND REMOVAL  LOCATION:    right radial  DEFLATED PER PROTOCOL:    Yes.    TIME BAND OFF / DRESSING APPLIED:    1600   SITE UPON ARRIVAL:    Level 0  SITE AFTER BAND REMOVAL:    Level 0  REVERSE ALLEN'S TEST:     positive  CIRCULATION SENSATION AND MOVEMENT:    Within Normal Limits   Yes.    COMMENTS:   Tolerated procedure well 

## 2014-05-11 NOTE — Care Management Note (Addendum)
    Page 1 of 1   05/12/2014     1:52:30 PM CARE MANAGEMENT NOTE 05/12/2014  Patient:  Allen Myers, Allen Myers   Account Number:  [id number]  Date Initiated:  05/11/2014  Documentation initiated by:  [facility]  Subjective/Objective Assessment:   68 yo male  presented today for surgery, with the diagnosis of SVT.//Home with spouse.     Action/Plan:   Procedure(s):  LEFT HEART CATHETERIZATION WITH CORONARY ANGIOGRAM.// Benefits check for Brilinta.   Anticipated DC Date:  05/12/2014   Anticipated DC Plan:  [place]  CM consult  Medication Assistance      Choice offered to / List presented to:             Status of service:  In process, will continue to follow Medicare Important Message given?   (If response is "NO", the following Medicare IM given date fields will be blank) Date Medicare IM given:   Medicare IM given by:   Date Additional Medicare IM given:   Additional Medicare IM given by:    Discharge Disposition:    Per UR Regulation:  Reviewed for med. necessity/level of care/duration of stay  If discussed at [place] of Stay Meetings, dates discussed:    Comments:  05/12/14 Fuller Mandril, RN, BSN, NCM [phone] PT COPAY WILL BE $55.00  05/11/14 [place], RN, BSN, [place] [phone] Benefits check for Brilinta.  Pt utilizes [facility] on [address] for precriptions.

## 2014-05-12 DIAGNOSIS — E669 Obesity, unspecified: Secondary | ICD-10-CM | POA: Diagnosis not present

## 2014-05-12 DIAGNOSIS — I2510 Atherosclerotic heart disease of native coronary artery without angina pectoris: Secondary | ICD-10-CM | POA: Diagnosis not present

## 2014-05-12 DIAGNOSIS — E119 Type 2 diabetes mellitus without complications: Secondary | ICD-10-CM | POA: Diagnosis not present

## 2014-05-12 DIAGNOSIS — I214 Non-ST elevation (NSTEMI) myocardial infarction: Secondary | ICD-10-CM | POA: Diagnosis not present

## 2014-05-12 LAB — CBC
HCT: 37.6 % — ABNORMAL LOW (ref 39.0–52.0)
HEMOGLOBIN: 12.8 g/dL — AB (ref 13.0–17.0)
MCH: 29.2 pg (ref 26.0–34.0)
MCHC: 34 g/dL (ref 30.0–36.0)
MCV: 85.8 fL (ref 78.0–100.0)
Platelets: 125 10*3/uL — ABNORMAL LOW (ref 150–400)
RBC: 4.38 MIL/uL (ref 4.22–5.81)
RDW: 15 % (ref 11.5–15.5)
WBC: 7.8 10*3/uL (ref 4.0–10.5)

## 2014-05-12 LAB — BASIC METABOLIC PANEL
Anion gap: 11 (ref 5–15)
BUN: 10 mg/dL (ref 6–23)
CALCIUM: 8.5 mg/dL (ref 8.4–10.5)
CHLORIDE: 101 meq/L (ref 96–112)
CO2: 24 mEq/L (ref 19–32)
CREATININE: 0.91 mg/dL (ref 0.50–1.35)
GFR calc non Af Amer: 85 mL/min — ABNORMAL LOW (ref >90)
Glucose, Bld: 168 mg/dL — ABNORMAL HIGH (ref 70–99)
Potassium: 4.4 mEq/L (ref 3.7–5.3)
Sodium: 136 mEq/L — ABNORMAL LOW (ref 137–147)

## 2014-05-12 LAB — TROPONIN I
TROPONIN I: 1.21 ng/mL — AB (ref <0.30)
Troponin I: 0.94 ng/mL (ref <0.30)

## 2014-05-12 LAB — GLUCOSE, CAPILLARY: GLUCOSE-CAPILLARY: 176 mg/dL — AB (ref 70–99)

## 2014-05-12 MED ORDER — TICAGRELOR 90 MG PO TABS: 90.0000 mg | ORAL_TABLET | Freq: Two times a day (BID) | ORAL | Status: DC

## 2014-05-12 MED ORDER — NITROGLYCERIN 0.4 MG SL SUBL: 0.4000 mg | SUBLINGUAL_TABLET | SUBLINGUAL | Status: DC | PRN

## 2014-05-12 MED ORDER — METFORMIN HCL 1000 MG PO TABS: 1000.0000 mg | ORAL_TABLET | Freq: Two times a day (BID) | ORAL | Status: DC

## 2014-05-12 NOTE — Progress Notes (Signed)
CRITICAL VALUE ALERT  Critical value received:  Troponin 0.94  Date of notification: 05/12/2014  Time of notification:  01:57  Critical value read back:Yes.    Nurse who received alert: Maxwell Marion RN  MD notified (1st page):  Dr. Elias Else  Time of first page:  01:57  MD notified (2nd page):  Time of second page:  Responding MD: Dr. Elias Else  Time MD responded:  02:05

## 2014-05-12 NOTE — Discharge Summary (Signed)
Physician Discharge Summary     Cardiologist:  Crenshaw  Patient ID: Allen Myers MRN: [id number] DOB/AGE: [birth year] 68 y.o.  Admit date: 05/11/2014 Discharge date: 05/12/2014  Admission Diagnoses:  Coronary atherosclerosis of native coronary artery  Discharge Diagnoses:  Active Problems:   HYPOTHYROIDISM   DIABETES MELLITUS, TYPE II   HYPERLIPIDEMIA   HYPERTENSION   OSA (obstructive sleep apnea)   Obesity (BMI 30.0-34.9)   Coronary atherosclerosis of native coronary artery   Discharged Condition: stable  Hospital Course:   68 yo male with history of recent syncope and cardiomyopathy. Admitted in June of 2015 following a syncopal episode on his roof. Event was complicated by subarachnoid and subdural hemorrhage as well as rib fractures; also with L1 transverse process fx. Echocardiogram showed an ejection fraction of 30-35%. Carotid Dopplers showed no significant obstruction. Chest CT showed no pulmonary embolus. Abdominal CT showed mild aneurysmal dilatation of the infrarenal aorta. Enzymes negative. NSVT noted on telemetry. There was concern about ventricular arrhythmia mediated syncope given reduced LV function. Patient was placed on cardiac medications. It was felt he would require cardiac catheterization but in the setting of subarachnoid/subdural hemorrhage anticoagulation could not be used if intervention felt to be necessary. It was therefore delayed. He was discharged with a life vest. Neurosurgery recommended delaying any anticoagulation for 4 weeks. Plan was for cardiac catheterization with revascularization as needed at a later date. Titration of medications and then repeat echocardiogram 3 months later. Ejection fraction less than 35% he would need an ICD.  He presented on 05/11/14 for the [facility] which revealed severe distal RCA stenosis, angiographically 95% and positive FFR of 0.77 with provocation only by contrast injection. Adenosine was not required.  He underwent  successful stenting of the distal RCA from 95% segmental narrowing to 0% with TIMI grade 3 flow. LVEF 50%. The procedure was complicated by transient no flow related to thrombus contained within the region of angioplasty. This was treated pharmacologically with return of TIMI grade 3 flow and gradual resolution of chest discomfort and left arm discomfort.  He was started on IV NTG  Which was later weaned off.  He appeared to have some more chest tightness.  Troponin increased 0.30>>0.94>>1.21.  He ambulated the following morning 106ft with cardiac rehab and had no angina.  The patient was seen by Dr. Martinique who felt he was stable for DC home. ASA, Brilinta.     Consults: None  Significant Diagnostic Studies:   Left Heart Catheterization with Coronary Angiography and PCI Report  LOUIS IVERY  68 y.o.  male  [birth year]  Procedure Date: 05/11/2014  Referring Physician: Kirk Ruths, M.D.  Primary Cardiologist:: Kirk Ruths, M.D.  INDICATIONS: Syncope with reduced LV function. The study is been ordered by the primary cardiologist to exclude significant coronary disease that may suggest an ischemic origin for transient arrhythmia resulting in syncope.  PROCEDURE: 1. Left heart catheterization; 2. Coronary angiography; 3. Left ventriculography; 4. FFR; 5. DES distal RCA  CONSENT:  The risks, benefits, and details of the procedure were explained in detail to the patient. Risks including death, stroke, heart attack, kidney injury, allergy, limb ischemia, bleeding and radiation injury were discussed. The patient verbalized understanding and wanted to proceed. Informed written consent was obtained.  PROCEDURE TECHNIQUE: After Xylocaine anesthesia a 5 French Slender sheath was placed in the right radial artery with an angiocath and the modified Seldinger technique. Coronary angiography was done using a 5 F JR 4 and JL 3.5 catheter. Left ventriculography  was done using the JR 4 catheter and hand  injection.  Angiography demonstrated a high-grade mid to distal RCA, greater than 90%. The distal territory is large. The concern is that the patient's syncope was arrhythmia related to the tight RCA lesion. He also has an intermediate stenosis in the left circumflex, 50-60%. This is not a hemodynamically significant lesion.  After additional IV heparin an FFR wire was placed distal to the stenosis. Distal bed vasodilatation caused by contrast injection resulted in an FFR of 0.77. Adenosine was not infused.  Using the wire for the FFR system, we redilated with a 3.0 x 15 balloon. We then positioned and deployed a 3.5 x 24 mm Promus Premier. 2 inflations were performed. We then post dilated with a 4.0 x 20 mm long emerge to 12 atmospheres. 2 balloon inflations were performed.  Post successful deployment we noted reduced TIMI grade flow from III to II . TIMI grade improved back to 3 with intracoronary nitroglycerin and intracoronary verapamil. Bilateral chest pressure and left arm tightness gradually resolved. IV nitroglycerin was started.  The procedure was done with heparin with multiple doses given to get an ACT around 300. Brilinta 180 mg was administered before wire manipulation.  CONTRAST: Total of 200 cc.  COMPLICATIONS: Slow flow/no reflow treated successfully with intracoronary nitroglycerin and verapamil  HEMODYNAMICS: Aortic pressure 124/70 mmHg; LV pressure 125/11 mmHg; LVEDP 17 mmHg  ANGIOGRAPHIC DATA: The left main coronary artery is widely patent.  The left anterior descending artery is heavily calcified. Widely patent but with multiple irregularities throughout the mid segment with up to 40-50% narrowing.  The left circumflex artery is is large in distribution. He gives origin to 4 obtuse marginal branches. The proximal to mid vessel contains an eccentric 50-70% narrowing.  The ramus intermedius branch is widely patent.  The right coronary artery is dominant. Gives origin to a large PDA. A  small left ventricular branches arise distally. The distal vessel contains a segmental 95% stenosis.  PCI RESULTS: 1. FFR 0.77 after placement of the flow wire and following coronary contrast injection. Adenosine was not required to provoke a significant value.  2. Successful PTCA and stenting of the distal RCA from 95% segment minimal stenosis to 0% with TIMI grade 3 flow. Post dilated to 4.0 mm in diameter.  LEFT VENTRICULOGRAM: Left ventricular angiogram was done in the 30 RAO projection and revealed a 50% overall function without focal wall motion abnormality  IMPRESSIONS: 1. Severe distal RCA stenosis, angiographically 95% and positive FFR of 0.77 with provocation only by contrast injection. Adenosine was not required  2. Successful stenting of the distal RCA from 95% segmental narrowing to 0% with TIMI grade 3 flow.  3. The procedure was complicated by transient no flow related to thrombus contained within the region of angioplasty. This was treated pharmacologically with return of TIMI grade 3 flow and gradual resolution of chest discomfort and left arm discomfort  4. Moderate proximal to mid circumflex. Not felt to be hemodynamically significant. There is multiple luminal are alert is in the mid LAD  5. Overall low normal LV systolic function with an EF of 50%. This is improved compared to the EF post syncope in June.  RECOMMENDATION: Will get cardiac markers in a.m. Assuming no dramatic increase in troponin, he would be eligible for discharge on aspirin and Brilinta..  Treatments: See above  Discharge Exam: Blood pressure 160/79, pulse 61, temperature 97.7 F (36.5 C), temperature source Oral, resp. rate 20, height 6\' 1"  (  1.854 m), weight 245 lb 13 oz (111.5 kg), SpO2 97.00%.   Disposition: 01-Home or Self Care      Discharge Instructions   Amb Referral to Cardiac Rehabilitation    Complete by:  As directed      Diet - low sodium heart healthy    Complete by:  As directed       Discharge instructions    Complete by:  As directed   No lifting with right arm for three days.     Increase activity slowly    Complete by:  As directed             Medication List         aspirin EC 81 MG tablet  Take 1 tablet (81 mg total) by mouth daily.     carvedilol 6.25 MG tablet  Commonly known as:  COREG  Take 1 tablet (6.25 mg total) by mouth 2 (two) times daily with a meal.     glucose blood test strip  Commonly known as:  ONE TOUCH TEST STRIPS  Use as directed twice daily to check blood sugar.  Diagnosis code 250.00     levETIRAcetam 500 MG tablet  Commonly known as:  KEPPRA  Take 1 tablet (500 mg total) by mouth 2 (two) times daily.     levothyroxine 100 MCG tablet  Commonly known as:  SYNTHROID, LEVOTHROID  Take 1 tablet (100 mcg total) by mouth daily before breakfast.     linagliptin 5 MG Tabs tablet  Commonly known as:  TRADJENTA  Take 1 tablet (5 mg total) by mouth daily.     lisinopril 2.5 MG tablet  Commonly known as:  PRINIVIL,ZESTRIL  Take 1 tablet (2.5 mg total) by mouth daily.     lovastatin 20 MG tablet  Commonly known as:  MEVACOR  Take 20 mg by mouth at bedtime.     metFORMIN 1000 MG tablet  Commonly known as:  GLUCOPHAGE  Take 1 tablet (1,000 mg total) by mouth 2 (two) times daily with a meal.     pioglitazone 45 MG tablet  Commonly known as:  ACTOS  Take 45 mg by mouth daily.     ticagrelor 90 MG Tabs tablet  Commonly known as:  BRILINTA  Take 1 tablet (90 mg total) by mouth 2 (two) times daily.       Follow-up Information   Follow up with Kirk Ruths, MD On 06/07/2014. (4:00 PM)    Specialty:  Cardiology   Contact information:   [address] [place] [address] [phone]      Greater than 30 minutes was spent completing the patient's discharge.    SignedTarri Fuller, [place] 05/12/2014, 9:58 AM

## 2014-05-12 NOTE — Progress Notes (Signed)
CARDIAC REHAB PHASE I   PRE:  Rate/Rhythm: 54 SB    BP: sitting 163/74    SaO2:   MODE:  Ambulation: 1000 ft   POST:  Rate/Rhythm: 81 SR    BP: sitting 160/79     SaO2:   Tolerated well. Denied angina. BP elevated. In depth ed of CAD, risk factor reduction, CRPII. Pt interested in CRPII and will send referral to [place]. Pt ready to make changes.  [id number]   Josephina Shih [place] CES, ACSM 05/12/2014 9:04 AM

## 2014-05-12 NOTE — Discharge Summary (Signed)
Patient seen and examined and history reviewed. Agree with above findings and plan. See earlier rounding note.  Allen Myers, [place] 05/12/2014 12:55 PM

## 2014-05-12 NOTE — Progress Notes (Signed)
Everything looks good for discharge.

## 2014-05-12 NOTE — Progress Notes (Signed)
Subjective: He reports having chest tightness yesterday.  None this morning.   Objective: Vital signs in last 24 hours: Temp:  [97.3 F (36.3 C)-97.7 F (36.5 C)] 97.5 F (36.4 C) (07/30 0421) Pulse Rate:  [52-60] 60 (07/30 0421) Resp:  [15-20] 20 (07/30 0421) BP: (97-147)/(46-78) 127/56 mmHg (07/30 0421) SpO2:  [93 %-98 %] 98 % (07/30 0421) Weight:  [245 lb (111.131 kg)-245 lb 13 oz (111.5 kg)] 245 lb 13 oz (111.5 kg) (07/30 0005) Last BM Date: 05/11/14  Intake/Output from previous day: 07/29 0701 - 07/30 0700 In: 14 [P.O.:360; I.V.:126] Out: 1200 [Urine:1200] Intake/Output this shift: Total I/O In: 80 [I.V.:80] Out: 600 [Urine:600]  Medications Current Facility-Administered Medications  Medication Dose Route Frequency Provider Last Rate Last Dose  . acetaminophen (TYLENOL) tablet 650 mg  650 mg Oral Q4H PRN Belva Crome III, MD   650 mg at 05/11/14 1959  . adenosine (ADENOCARD) 12 MG/4ML injection 48 mg  16 mL Intravenous Once Belva Crome III, MD      . aspirin EC tablet 81 mg  81 mg Oral Daily Belva Crome III, MD      . carvedilol (COREG) tablet 6.25 mg  6.25 mg Oral BID WC Belva Crome III, MD   6.25 mg at 05/11/14 1800  . insulin aspart (novoLOG) injection 0-15 Units  0-15 Units Subcutaneous TID WC Luke K Kilroy, PA-C      . insulin aspart (novoLOG) injection 0-5 Units  0-5 Units Subcutaneous QHS Luke K Kilroy, PA-C      . levETIRAcetam (KEPPRA) tablet 500 mg  500 mg Oral BID Belva Crome III, MD   500 mg at 05/11/14 2113  . levothyroxine (SYNTHROID, LEVOTHROID) tablet 100 mcg  100 mcg Oral QAC breakfast Belva Crome III, MD   100 mcg at 05/12/14 [phone]  . linagliptin (TRADJENTA) tablet 5 mg  5 mg Oral Daily Belva Crome III, MD      . lisinopril (PRINIVIL,ZESTRIL) tablet 2.5 mg  2.5 mg Oral Daily Belva Crome III, MD      . nitroGLYCERIN 50 mg in dextrose 5 % 250 mL (0.2 mg/mL) infusion  30 mcg/min Intravenous Titrated Belva Crome III, MD   15 mcg/min at  05/12/14 0300  . ondansetron (ZOFRAN) injection 4 mg  4 mg Intravenous Q6H PRN Belva Crome III, MD   4 mg at 05/11/14 2257  . pioglitazone (ACTOS) tablet 45 mg  45 mg Oral Daily Belva Crome III, MD      . simvastatin (ZOCOR) tablet 20 mg  20 mg Oral q1800 Belva Crome III, MD   20 mg at 05/11/14 1826  . ticagrelor (BRILINTA) tablet 90 mg  90 mg Oral BID [name], MD   90 mg at 05/11/14 2113    PE: General appearance: alert, cooperative and no distress Lungs: clear to auscultation bilaterally Heart: regular rate and rhythm, S1, S2 normal, no murmur, click, rub or gallop Extremities: No LEE Pulses: 2+ and symmetric Skin: Warm and dry Neurologic: Grossly normal  Lab Results:   Recent Labs  05/09/14 1357 05/12/14 0040  WBC 6.5 7.8  HGB 14.2 12.8*  HCT 42.7 37.6*  PLT 135* 125*   BMET  Recent Labs  05/09/14 1357 05/12/14 0040  NA 141 136*  K 4.7 4.4  CL 105 101  CO2 26 24  GLUCOSE 124* 168*  BUN 10 10  CREATININE 0.83 0.91  CALCIUM 8.9  8.5   PT/INR  Recent Labs  05/09/14 1357  LABPROT 12.8  INR 0.96      Assessment/Plan  Active Problems:   HYPOTHYROIDISM   DIABETES MELLITUS, TYPE II   HYPERLIPIDEMIA   HYPERTENSION   OSA (obstructive sleep apnea)   Obesity (BMI 30.0-34.9)   Coronary atherosclerosis of native coronary artery   Elevated troponin     Plan:   SP left heart cath revealing severe distal RCA stenosis, angiographically 95% and positive FFR of 0.77 with provocation only by contrast injection. Adenosine was not required He underwent successful stenting of the distal RCA from 95% segmental narrowing to 0% with TIMI grade 3 flow.  LVEF 50%.  The procedure was complicated by transient no flow related to thrombus contained within the region of angioplasty. This was treated pharmacologically with return of TIMI grade 3 flow and gradual resolution of chest discomfort and left arm discomfort.  Troponin increased to 0.94 thus far.  First two  WNL.  Recheck this morning.  Ambulate.  If he has no chest tightness, DC home.   ASA, Brilinta.    BP and HR controlled    LOS: 1 day    HAGER, BRYAN PA-C 05/12/2014 6:29 AM  Patient seen and examined and history reviewed. Agree with above findings and plan. Patient is pain free this am. Mild troponin leak post PCI due to no reflow. Ecg is unchanged. I think he is stable for DC today. Continue DAPT for at least one year.  Peter Martinique, [place] 05/12/2014 9:40 AM

## 2014-05-13 ENCOUNTER — Telehealth: Payer: Self-pay | Admitting: Physician Assistant

## 2014-05-13 ENCOUNTER — Telehealth: Payer: Self-pay | Admitting: Cardiology

## 2014-05-13 NOTE — Telephone Encounter (Signed)
Spoke with byran hager pa-c, he is going to call the patient at home.

## 2014-05-13 NOTE — Telephone Encounter (Signed)
I spoke to Dr. Martinique and with LVgram of 50% during recent cath, the patient can discontinue the Lifevest.  Essynce Munsch PA-C

## 2014-05-13 NOTE — Telephone Encounter (Signed)
Pt was discharged from the hospital yesterday. Dr Tamala Julian said he would talk to Dr Stanford Breed about pt wearing a Life Vest or not,he have not heard anything.

## 2014-05-16 ENCOUNTER — Ambulatory Visit (INDEPENDENT_AMBULATORY_CARE_PROVIDER_SITE_OTHER): Payer: Managed Care, Other (non HMO) | Admitting: Neurology

## 2014-05-16 ENCOUNTER — Telehealth: Payer: Self-pay | Admitting: Cardiology

## 2014-05-16 ENCOUNTER — Encounter: Payer: Self-pay | Admitting: Neurology

## 2014-05-16 ENCOUNTER — Telehealth: Payer: Self-pay | Admitting: Internal Medicine

## 2014-05-16 ENCOUNTER — Other Ambulatory Visit (INDEPENDENT_AMBULATORY_CARE_PROVIDER_SITE_OTHER): Payer: Managed Care, Other (non HMO)

## 2014-05-16 VITALS — BP 80/54 | HR 69 | Ht 73.0 in | Wt 250.4 lb

## 2014-05-16 DIAGNOSIS — R569 Unspecified convulsions: Secondary | ICD-10-CM

## 2014-05-16 DIAGNOSIS — R55 Syncope and collapse: Secondary | ICD-10-CM

## 2014-05-16 DIAGNOSIS — S069X9S Unspecified intracranial injury with loss of consciousness of unspecified duration, sequela: Secondary | ICD-10-CM

## 2014-05-16 DIAGNOSIS — S065X9S Traumatic subdural hemorrhage with loss of consciousness of unspecified duration, sequela: Secondary | ICD-10-CM

## 2014-05-16 DIAGNOSIS — S069XAS Unspecified intracranial injury with loss of consciousness status unknown, sequela: Secondary | ICD-10-CM

## 2014-05-16 NOTE — Telephone Encounter (Signed)
Discussed with PA-C, pt instructed to hold the lisinopril. He will cut the carvedilol in 1/2 to 3.125 mg bid. They will check his bp for the next couple days and let us know how it is running so we will know how to adjust his meds. Pt agreed with this plan.

## 2014-05-16 NOTE — Patient Instructions (Signed)
I had a long discussion with the patient and his wife Re: his episode of loss of consciousness, seizures, discussed results of hospital evaluation, personally reviewed imaging studies and answered questions. Recommend check MRI scan of the brain and MRA of the brain and repeat EEG. Continue Keppra for seizure prophylaxis. Do not drive for the next 3 months as per [place] law. Return for followup with Charlott Holler, NP in 2 months or earlier if necessary. Seizure, Adult A seizure is abnormal electrical activity in the brain. Seizures usually last from 30 seconds to 2 minutes. There are various types of seizures. Before a seizure, you may have a warning sensation (aura) that a seizure is about to occur. An aura may include the following symptoms:   Fear or anxiety.  Nausea.  Feeling like the room is spinning (vertigo).  Vision changes, such as seeing flashing lights or spots. Common symptoms during a seizure include:  A change in attention or behavior (altered mental status).  Convulsions with rhythmic jerking movements.  Drooling.  Rapid eye movements.  Grunting.  Loss of bladder and bowel control.  Bitter taste in the mouth.  Tongue biting. After a seizure, you may feel confused and sleepy. You may also have an injury resulting from convulsions during the seizure. HOME CARE INSTRUCTIONS   If you are given medicines, take them exactly as prescribed by your health care provider.  Keep all follow-up appointments as directed by your health care provider.  Do not swim or drive or engage in risky activity during which a seizure could cause further injury to you or others until your health care provider says it is OK.  Get adequate rest.  Teach friends and family what to do if you have a seizure. They should:  Lay you on the ground to prevent a fall.  Put a cushion under your head.  Loosen any tight clothing around your neck.  Turn you on your side. If vomiting occurs, this helps keep  your airway clear.  Stay with you until you recover.  Know whether or not you need emergency care. SEEK IMMEDIATE MEDICAL CARE IF:  The seizure lasts longer than 5 minutes.  The seizure is severe or you do not wake up immediately after the seizure.  You have an altered mental status after the seizure.  You are having more frequent or worsening seizures. Someone should drive you to the emergency department or call local emergency services (911 in [place] MAKE SURE YOU:  Understand these instructions.  Will watch your condition.  Will get help right away if you are not doing well or get worse. Document Released: 09/27/2000 Document Revised: 07/21/2013 Document Reviewed: 05/12/2013 [facility] Patient Information 2015 [place], [place]. This information is not intended to replace advice given to you by your health care provider. Make sure you discuss any questions you have with your health care provider.

## 2014-05-16 NOTE — Telephone Encounter (Signed)
Spoke with pt wife, his bp this morning prior to meds was 117. At the neurologist office after several checks it was 80/54. He is having some dizziness and is tired. He has already taken his lisinopril and carvedilol today. Explained he will need to drink plenty of fluids today and orthostatic precautions discussed. Will discuss with dayna dunn pa-c medication adjustments.

## 2014-05-16 NOTE — Telephone Encounter (Signed)
Patient would like a call in regards to disability forms sent from [facility].

## 2014-05-16 NOTE — Progress Notes (Signed)
[facility] [address] [place]. [place] [address] [phone]       OFFICE CONSULT NOTE  [name] Date of Birth:  11/26/1945 Medical Record Number:  [id number]   Referring MD:  Allen Myers  Reason for Referral:  Seizure HPI: 28 year Caucasian male who had episode of brief loss of consciousness on 04/01/14. He was raking some leaves on the roof of his house when he states that he felt dizzy and lost balance and fell down and hit his head. He must have lost consciousness for a while. His wife called his neighbor who climbed on the roof and noticed that the patient was all pale and bluish and had some jerking. He was found to have multiple rib fractures and L1 transverse process fracture. CT scan of the head showed a small right convexity subdural hematoma and superficial left temporoparietal occipital subarachnoid hemorrhage. He was treated conservatively and remained stable. He subsequently had a witnessed generalized tonic-clonic seizure the next after admission and required temporary intubation for her for protection. He was started on Keppra and had no further seizures during hospitalization. Followup CT scan of the brain which I have personally reviewed showed stable appearance. For repeat followup CT scan done on 04/28/14 which I have personally reviewed shows complete resolution of the tiny subdural and somewhat subarachnoid bleeds. He states is done well healed has had no further seizures. His headaches have resolved and he has not taken any headache medications for more than 2-3 weeks. He has no focal neurological symptoms or deficits. He does however complain of orthostatic dizziness. His blood pressure in fact today is recorded twice is 80/54. He does have mild orthostasis symptoms when he stands up. He denies gait imbalance, double vision or vertigo. He has not had any prior history of strokes or TIAs. He seems to be tolerating Keppra 500 twice daily well without  major side effects. He had cardiac catheterization done last week by Dr. Daneen Myers and had a stent placed. He is currently on aspirin 81 mg ambulance the. He states he has not had any history of hyperglycemia sugars are well controlled and his blood sugar on admission was 318 mg percent.  ROS:   14 system review of systems is positive for dizziness, weakness, shortness of breath, fatigue, spinning sensation, snoring and all other systems negative  PMH:  Past Medical History  Diagnosis Date  . ALLERGIC RHINITIS 05/07/2007  . HYPERLIPIDEMIA 05/07/2007  . COLONIC POLYPS, HX OF 12/07/2007  . DIVERTICULITIS, HX OF 05/07/2007  . DIVERTICULOSIS, COLON 12/07/2007  . ERECTILE DYSFUNCTION 05/07/2007  . HYPERTENSION 05/16/2008    pt denies  . Cardiomyopathy   . SDH (subdural hematoma)     Following syncopal episode  . SAH (subarachnoid hemorrhage)     Following syncopal episode  . Kidney stones     "passed it"  . Chronic bronchitis     "get it pretty much q yr" (05/11/2014)  . SLEEP APNEA, OBSTRUCTIVE     "don't wear mask" (05/11/2014)  . HYPOTHYROIDISM   . DEPRESSION   . Type II diabetes mellitus   . Seizures     just once a few weeks ago" (05/11/2014)    Social History:  History   Social History  . Marital Status: Married    Spouse Name: N/A    Number of Children: 3  . Years of Education: N/A   Occupational History  . 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 sales steel based in  [place] History Main Topics  . Smoking status: Former Smoker -- 1.00 packs/day for 30 years    Types: Cigarettes    Quit date: 10/15/1983  . Smokeless tobacco: Never Used  . Alcohol Use: 0.6 oz/week    1 Shots of liquor per week  . Drug Use: No  . Sexual Activity: Yes   Other Topics Concern  . Not on file   Social History Narrative   3 daughters 52, 26, 59 and 2 grandchildren    Medications:   Current Outpatient Prescriptions on File Prior to Visit  Medication Sig Dispense Refill  . aspirin EC 81  MG tablet Take 1 tablet (81 mg total) by mouth daily.  90 tablet  3  . glucose blood (ONE TOUCH TEST STRIPS) test strip Use as directed twice daily to check blood sugar.  Diagnosis code 250.00  100 each  11  . levETIRAcetam (KEPPRA) 500 MG tablet Take 1 tablet (500 mg total) by mouth 2 (two) times daily.  60 tablet  0  . levothyroxine (SYNTHROID, LEVOTHROID) 100 MCG tablet Take 1 tablet (100 mcg total) by mouth daily before breakfast.  60 tablet  0  . linagliptin (TRADJENTA) 5 MG TABS tablet Take 1 tablet (5 mg total) by mouth daily.  30 tablet  1  . lovastatin (MEVACOR) 20 MG tablet Take 20 mg by mouth at bedtime.      . metFORMIN (GLUCOPHAGE) 1000 MG tablet Take 1 tablet (1,000 mg total) by mouth 2 (two) times daily with a meal.      . nitroGLYCERIN (NITROSTAT) 0.4 MG SL tablet Place 1 tablet (0.4 mg total) under the tongue every 5 (five) minutes as needed for chest pain.  25 tablet  12  . pioglitazone (ACTOS) 45 MG tablet Take 45 mg by mouth daily.      . ticagrelor (BRILINTA) 90 MG TABS tablet Take 1 tablet (90 mg total) by mouth 2 (two) times daily.  60 tablet  10   No current facility-administered medications on file prior to visit.    Allergies:  No Known Allergies  Physical Exam General: Mildly obese elderly Caucasian male, seated, in no evident distress Head: head normocephalic and atraumatic. Orohparynx benign Neck: supple with no carotid or supraclavicular bruits Cardiovascular: regular rate and rhythm, no murmurs Musculoskeletal: no deformity Skin:  no rash/petichiae Vascular:  Normal pulses all extremities Filed Vitals:   05/16/14 0923  BP: 80/54  Pulse: 69    Neurologic Exam Mental Status: Awake and fully alert. Oriented to place and time. Recent and remote memory intact. Attention span, concentration and fund of knowledge appropriate. Mood and affect appropriate.  Cranial Nerves: Fundoscopic exam reveals sharp disc margins. Pupils equal, briskly reactive to light.  Extraocular movements full without nystagmus. Visual fields full to confrontation. Hearing intact. Facial sensation intact. Face, tongue, palate moves normally and symmetrically.  Motor: Normal bulk and tone. Normal strength in all tested extremity muscles. Sensory.: intact to tough and pinprick and vibratory.  Coordination: Rapid alternating movements normal in all extremities. Finger-to-nose and heel-to-shin performed accurately bilaterally. Gait and Station: Arises from chair without difficulty. Stance is normal. Gait demonstrates normal stride length and balance . Able to heel, toe and tandem walk without difficulty.  Reflexes: 1+ and symmetric. Toes downgoing.   NIHSS  0 Modified Rankin  1  ASSESSMENT: 31 year Caucasian male with brief loss of consciousness on 04/01/14 from possible syncopal episode resulting in small traumatic subdural and subarachnoid bleed followed  later by witnessed generalized tonic-clonic seizure.    PLAN: I had a long discussion with the patient and his wife Re: his episode of loss of consciousness, seizures, discussed results of hospital evaluation, personally reviewed imaging studies and answered questions. Recommend check MRI scan of the brain and MRA of the brain and repeat EEG. Continue Keppra for seizure prophylaxis. Do not drive for the next 3 months as per [place] law. I also advised him to discuss lowering his blood pressure medicines with his cardiologist Dr. Daneen Myers .Return for followup with Charlott Holler, NP in 2 months or earlier if necessary.  Note: This document was prepared with digital dictation and possible smart phrase technology. Any transcriptional errors that result from this process are unintentional.

## 2014-05-16 NOTE — Telephone Encounter (Signed)
He is at his neurologist, his blood pressure is 80/54. He is a little tired and dizzy.He was advised to call his cardiologist.

## 2014-05-17 ENCOUNTER — Telehealth: Payer: Self-pay | Admitting: *Deleted

## 2014-05-17 NOTE — Telephone Encounter (Signed)
Chart reviewed, and I agree with holding the BP med for now, can be reassessed at next visit

## 2014-05-17 NOTE — Telephone Encounter (Signed)
Left msg on triage stating wanting to know did md received short term disability form that was fax to [phone]. Called [facility] no answer [facility] that is not our correct fax #, left correct # [phone]...Allen Myers

## 2014-05-17 NOTE — Telephone Encounter (Signed)
Called patient to follow-up. Allen Myers is requesting medical records. Records request was sent down to medical records department.  Patient is wanting Dr. Jenny Reichmann to know that he went to see his Neurologist yesterday and was told to stop taking his Lisinopril for now. Patient wants for Dr. Jenny Reichmann to be aware and to see if he is okay with him not taking blood pressure medication. Please advise.

## 2014-05-18 NOTE — Telephone Encounter (Signed)
Called left a detailed message of MD ok to stop BP med. At this time.

## 2014-05-19 ENCOUNTER — Ambulatory Visit: Payer: Managed Care, Other (non HMO)

## 2014-05-22 DIAGNOSIS — Z0279 Encounter for issue of other medical certificate: Secondary | ICD-10-CM

## 2014-05-26 ENCOUNTER — Telehealth: Payer: Self-pay | Admitting: Cardiology

## 2014-05-26 NOTE — Telephone Encounter (Signed)
Received Disability forms back from [employer] @ [facility] on 05/26/14  Given to D [facility] for Dr Stanford Breed to complete and sign 05/26/14 lp

## 2014-05-27 NOTE — Telephone Encounter (Signed)
FMLA paperwork signed and given back to medical records.

## 2014-05-28 ENCOUNTER — Ambulatory Visit
Admission: RE | Admit: 2014-05-28 | Discharge: 2014-05-28 | Disposition: A | Discharge status: Home / Self Care | Payer: Managed Care, Other (non HMO) | Source: Ambulatory Visit | Attending: Neurology | Admitting: Neurology

## 2014-05-28 DIAGNOSIS — S069XAS Unspecified intracranial injury with loss of consciousness status unknown, sequela: Secondary | ICD-10-CM

## 2014-05-28 DIAGNOSIS — R569 Unspecified convulsions: Secondary | ICD-10-CM

## 2014-05-28 DIAGNOSIS — S069X9S Unspecified intracranial injury with loss of consciousness of unspecified duration, sequela: Secondary | ICD-10-CM

## 2014-05-28 MED ORDER — GADOBENATE DIMEGLUMINE 529 MG/ML IV SOLN
20.0000 mL | Freq: Once | INTRAVENOUS | Status: AC | PRN
  Administered 2014-05-28: 20 mL via INTRAVENOUS

## 2014-05-30 ENCOUNTER — Telehealth: Payer: Self-pay | Admitting: Cardiology

## 2014-05-30 NOTE — Telephone Encounter (Signed)
Call & left msg to return call regarding Disability form that is ready.

## 2014-05-31 ENCOUNTER — Telehealth: Payer: Self-pay | Admitting: Neurology

## 2014-05-31 ENCOUNTER — Telehealth: Payer: Self-pay | Admitting: Cardiology

## 2014-05-31 NOTE — Telephone Encounter (Signed)
Pt is trying to get in Cardiac Rehab by Thursday,needs a referral right away from Dr Stanford Breed.

## 2014-05-31 NOTE — Telephone Encounter (Signed)
Patient wanting to know when he will hear back regarding his MRI and MRA that he had on Saturday.

## 2014-05-31 NOTE — Telephone Encounter (Signed)
Patient requesting results of MRI/MRA. Please review.

## 2014-06-01 ENCOUNTER — Telehealth: Payer: Self-pay

## 2014-06-01 DIAGNOSIS — Z0289 Encounter for other administrative examinations: Secondary | ICD-10-CM

## 2014-06-01 MED ORDER — TICAGRELOR 90 MG PO TABS: 90.0000 mg | ORAL_TABLET | Freq: Two times a day (BID) | ORAL | Status: DC

## 2014-06-01 MED ORDER — LEVETIRACETAM 500 MG PO TABS: 500.0000 mg | ORAL_TABLET | Freq: Two times a day (BID) | ORAL | Status: DC

## 2014-06-01 MED ORDER — LINAGLIPTIN 5 MG PO TABS: 5.0000 mg | ORAL_TABLET | Freq: Every day | ORAL | Status: DC

## 2014-06-01 MED ORDER — LOVASTATIN 20 MG PO TABS: 20.0000 mg | ORAL_TABLET | Freq: Every day | ORAL | Status: DC

## 2014-06-01 MED ORDER — PIOGLITAZONE HCL 45 MG PO TABS: 45.0000 mg | ORAL_TABLET | Freq: Every day | ORAL | Status: DC

## 2014-06-01 MED ORDER — METFORMIN HCL 1000 MG PO TABS: 1000.0000 mg | ORAL_TABLET | Freq: Two times a day (BID) | ORAL | Status: DC

## 2014-06-01 MED ORDER — CARVEDILOL 6.25 MG PO TABS: 3.1250 mg | ORAL_TABLET | Freq: Two times a day (BID) | ORAL | Status: DC

## 2014-06-01 MED ORDER — LEVOTHYROXINE SODIUM 100 MCG PO TABS: 100.0000 ug | ORAL_TABLET | Freq: Every day | ORAL | Status: DC

## 2014-06-01 NOTE — Telephone Encounter (Signed)
Patient called left message to call back regarding phone call from patient to discuss medications and refills.

## 2014-06-01 NOTE — Telephone Encounter (Signed)
Returned the patients call. Spoke with the patient and sent in requested medication to [facility].

## 2014-06-01 NOTE — Telephone Encounter (Signed)
Left message for pt, order was received by rehab on 05-26-14 and they tell me they are working on it.

## 2014-06-02 ENCOUNTER — Encounter (HOSPITAL_COMMUNITY)
Admission: RE | Admit: 2014-06-02 | Discharge: 2014-06-02 | Disposition: A | Discharge status: Home / Self Care | Payer: Managed Care, Other (non HMO) | Source: Ambulatory Visit | Attending: Cardiology | Admitting: Cardiology

## 2014-06-02 DIAGNOSIS — I2510 Atherosclerotic heart disease of native coronary artery without angina pectoris: Secondary | ICD-10-CM | POA: Insufficient documentation

## 2014-06-02 DIAGNOSIS — I214 Non-ST elevation (NSTEMI) myocardial infarction: Secondary | ICD-10-CM | POA: Insufficient documentation

## 2014-06-02 DIAGNOSIS — I714 Abdominal aortic aneurysm, without rupture, unspecified: Secondary | ICD-10-CM | POA: Insufficient documentation

## 2014-06-02 DIAGNOSIS — I428 Other cardiomyopathies: Secondary | ICD-10-CM | POA: Insufficient documentation

## 2014-06-02 DIAGNOSIS — I4891 Unspecified atrial fibrillation: Secondary | ICD-10-CM | POA: Insufficient documentation

## 2014-06-02 DIAGNOSIS — Z5189 Encounter for other specified aftercare: Secondary | ICD-10-CM | POA: Insufficient documentation

## 2014-06-02 NOTE — Telephone Encounter (Signed)
[employer] Disability forms faxed to [employer] per patient request 06/01/14 lp

## 2014-06-02 NOTE — Telephone Encounter (Signed)
Patient calling back regarding MRI results.  Please return call anytime and leave message if not available.

## 2014-06-02 NOTE — Telephone Encounter (Signed)
I called the patient's listed in number and left a message on answering machine that MRI scan showed expected evolutionary findings of resolving subdural hematomas and there is no need to worry. He was advised to call back incase  he had further questions.

## 2014-06-02 NOTE — Telephone Encounter (Signed)
Called patient and lvm letting him know Dr. Leonie Man has his results and should be calling him back today.

## 2014-06-02 NOTE — Progress Notes (Signed)
Cardiac Rehab Medication Review by a Pharmacist  Does the patient  feel that his/her medications are working for him/her?  yes  Has the patient been experiencing any side effects to the medications prescribed?  no  Does the patient measure his/her own blood pressure or blood glucose at home?  yes   Does the patient have any problems obtaining medications due to transportation or finances?   no  Understanding of regimen: fair Understanding of indications: fair Potential of compliance: excellent    Pharmacist comments: Patient rates understanding of medication indication as not great but says he uses his pill box religiously and doesn't miss any doses   Elicia Lamp, PharmD Clinical Pharmacist - Resident Pager [phone] 06/02/2014 8:45 AM

## 2014-06-06 ENCOUNTER — Encounter (HOSPITAL_COMMUNITY)
Admission: RE | Admit: 2014-06-06 | Discharge: 2014-06-06 | Disposition: A | Discharge status: Home / Self Care | Payer: Managed Care, Other (non HMO) | Source: Ambulatory Visit | Attending: Cardiology | Admitting: Cardiology

## 2014-06-06 ENCOUNTER — Encounter (HOSPITAL_COMMUNITY): Payer: Self-pay

## 2014-06-06 DIAGNOSIS — I4891 Unspecified atrial fibrillation: Secondary | ICD-10-CM | POA: Diagnosis present

## 2014-06-06 DIAGNOSIS — I714 Abdominal aortic aneurysm, without rupture, unspecified: Secondary | ICD-10-CM | POA: Diagnosis not present

## 2014-06-06 DIAGNOSIS — I2510 Atherosclerotic heart disease of native coronary artery without angina pectoris: Secondary | ICD-10-CM | POA: Diagnosis not present

## 2014-06-06 DIAGNOSIS — Z5189 Encounter for other specified aftercare: Secondary | ICD-10-CM | POA: Diagnosis present

## 2014-06-06 DIAGNOSIS — I214 Non-ST elevation (NSTEMI) myocardial infarction: Secondary | ICD-10-CM | POA: Diagnosis not present

## 2014-06-06 DIAGNOSIS — I428 Other cardiomyopathies: Secondary | ICD-10-CM | POA: Diagnosis not present

## 2014-06-06 LAB — GLUCOSE, CAPILLARY
GLUCOSE-CAPILLARY: 90 mg/dL (ref 70–99)
Glucose-Capillary: 161 mg/dL — ABNORMAL HIGH (ref 70–99)

## 2014-06-06 NOTE — Progress Notes (Signed)
Pt started cardiac rehab today.  Pt tolerated light exercise without difficulty.  Asymptomatic.  VSS, telemetry-sinus rhythm with first degree AVB.  PHQ-0.  Pt exhibits no barriers to rehab participation. Pt displays good coping skills with positive outlook.  Pt rehab goals are to learn heart healthy eating and lifestyle modifications to prevent progression of coronary disease.  Pt oriented to exercise equipment and routine.  Understanding verbalized.

## 2014-06-07 ENCOUNTER — Encounter: Payer: Self-pay | Admitting: *Deleted

## 2014-06-07 ENCOUNTER — Other Ambulatory Visit (HOSPITAL_COMMUNITY): Payer: Self-pay | Admitting: Physician Assistant

## 2014-06-07 ENCOUNTER — Ambulatory Visit (INDEPENDENT_AMBULATORY_CARE_PROVIDER_SITE_OTHER): Payer: Managed Care, Other (non HMO) | Admitting: Cardiology

## 2014-06-07 ENCOUNTER — Encounter: Payer: Self-pay | Admitting: Cardiology

## 2014-06-07 VITALS — BP 124/78 | HR 66 | Ht 72.0 in | Wt 244.0 lb

## 2014-06-07 DIAGNOSIS — R55 Syncope and collapse: Secondary | ICD-10-CM

## 2014-06-07 DIAGNOSIS — I429 Cardiomyopathy, unspecified: Secondary | ICD-10-CM

## 2014-06-07 DIAGNOSIS — E785 Hyperlipidemia, unspecified: Secondary | ICD-10-CM

## 2014-06-07 DIAGNOSIS — I2510 Atherosclerotic heart disease of native coronary artery without angina pectoris: Secondary | ICD-10-CM

## 2014-06-07 DIAGNOSIS — I714 Abdominal aortic aneurysm, without rupture, unspecified: Secondary | ICD-10-CM

## 2014-06-07 MED ORDER — ATORVASTATIN CALCIUM 80 MG PO TABS: 80.0000 mg | ORAL_TABLET | Freq: Every day | ORAL | Status: DC

## 2014-06-07 MED ORDER — TICAGRELOR 90 MG PO TABS: 90.0000 mg | ORAL_TABLET | Freq: Two times a day (BID) | ORAL | Status: DC

## 2014-06-07 MED ORDER — LISINOPRIL 2.5 MG PO TABS: 2.5000 mg | ORAL_TABLET | Freq: Every day | ORAL | Status: DC

## 2014-06-07 NOTE — Assessment & Plan Note (Signed)
Patient now with documented coronary artery disease. Discontinue Mevacor. Begin Lipitor 80 mg daily. Check lipids and liver in 6 weeks.

## 2014-06-07 NOTE — Telephone Encounter (Signed)
Rx was sent to pharmacy electronically. 

## 2014-06-07 NOTE — Assessment & Plan Note (Signed)
LV function improved on catheterization. Continue beta blocker. He had difficulties with hypotension with lisinopril previously. We will try 2.5 mg by mouth each bedtime to see if his blood pressure will tolerate. Check potassium and renal function in one week. Repeat echocardiogram in 6 weeks. Ejection fraction most recently 50% on Cardiac catheterization.

## 2014-06-07 NOTE — Assessment & Plan Note (Signed)
Previous etiology unclear. No recurrent episodes. Patient instructed not to drive for 6 months following initial event. ICD not indicated given improvement in EF.

## 2014-06-07 NOTE — Patient Instructions (Signed)
Your physician wants you to follow-up in: [place] will receive a reminder letter in the mail two months in advance. If you don't receive a letter, please call our office to schedule the follow-up appointment.   START LISINOPRIL 2.5 MG ONCE DAILY AT BEDTIME  Your physician recommends that you return for lab work in: [place] has requested that you have an echocardiogram. Echocardiography is a painless test that uses sound waves to create images of your heart. It provides your doctor with information about the size and shape of your heart and how well your heart's chambers and valves are working. This procedure takes approximately one hour. There are no restrictions for this procedure.SCHEDULE IN 6 WEEKS  STOP LOVASTATIN  START ATORVASTATIN 80 MG ONCE DAILY  Your physician recommends that you return for lab work in: [place] TO LAB WORK

## 2014-06-07 NOTE — Assessment & Plan Note (Signed)
Continue aspirin and statin. Continue brilinta for one year.

## 2014-06-07 NOTE — Progress Notes (Signed)
HPI: FU syncope and cardiomyopathy. Admitted in June of 2015 following a syncopal episode on his roof. Event was complicated by subarachnoid and subdural hemorrhage as well as rib fractures; also with L1 transverse process fx. Echocardiogram showed an ejection fraction of 30-35%. Carotid Dopplers showed no significant obstruction. Chest CT showed no pulmonary embolus. Abdominal CT showed mild aneurysmal dilatation of the infrarenal aorta. Enzymes negative. NSVT noted on telemetry. There was concern about ventricular arrhythmia mediated syncope given reduced LV function. Patient was placed on cardiac medications. It was felt he would require cardiac catheterization but in the setting of subarachnoid/subdural hemorrhage anticoagulation could not be used if intervention felt to be necessary. It was therefore delayed. He was discharged with a life vest. Neurosurgery recommended delaying any anticoagulation for 4 weeks. Plan was for cardiac catheterization with revascularization as needed at a later date. Titration of medications and then repeat echocardiogram 3 months later. Ejection fraction less than 35% he would need an ICD. Cardiac cath 7/15 showed 40-50 LAD, 50-70 Lcx, 95 distal RCA and EF 50. Had PCI of RCA. Since last seen, He denies dyspnea, chest pain or syncope.   Current Outpatient Prescriptions  Medication Sig Dispense Refill  . aspirin EC 81 MG tablet Take 1 tablet (81 mg total) by mouth daily.  90 tablet  3  . BRILINTA 90 MG TABS tablet TAKE 1 TABLET (90 MG TOTAL) BY MOUTH 2 (TWO) TIMES DAILY.  60 tablet  0  . carvedilol (COREG) 6.25 MG tablet Take 0.5 tablets (3.125 mg total) by mouth 2 (two) times daily with a meal.  180 tablet  3  . glucose blood (ONE TOUCH TEST STRIPS) test strip Use as directed twice daily to check blood sugar.  Diagnosis code 250.00  100 each  11  . levETIRAcetam (KEPPRA) 500 MG tablet Take 1 tablet (500 mg total) by mouth 2 (two) times daily.  180 tablet  3  .  levothyroxine (SYNTHROID, LEVOTHROID) 100 MCG tablet Take 1 tablet (100 mcg total) by mouth daily before breakfast.  90 tablet  3  . linagliptin (TRADJENTA) 5 MG TABS tablet Take 1 tablet (5 mg total) by mouth daily.  90 tablet  3  . lovastatin (MEVACOR) 20 MG tablet Take 1 tablet (20 mg total) by mouth at bedtime.  90 tablet  3  . metFORMIN (GLUCOPHAGE) 1000 MG tablet Take 1 tablet (1,000 mg total) by mouth 2 (two) times daily with a meal.  180 tablet  3  . nitroGLYCERIN (NITROSTAT) 0.4 MG SL tablet Place 1 tablet (0.4 mg total) under the tongue every 5 (five) minutes as needed for chest pain.  25 tablet  12  . pioglitazone (ACTOS) 45 MG tablet Take 1 tablet (45 mg total) by mouth daily.  90 tablet  3   No current facility-administered medications for this visit.     Past Medical History  Diagnosis Date  . ALLERGIC RHINITIS 05/07/2007  . HYPERLIPIDEMIA 05/07/2007  . COLONIC POLYPS, HX OF 12/07/2007  . DIVERTICULITIS, HX OF 05/07/2007  . DIVERTICULOSIS, COLON 12/07/2007  . ERECTILE DYSFUNCTION 05/07/2007  . HYPERTENSION 05/16/2008    pt denies  . Cardiomyopathy   . SDH (subdural hematoma)     Following syncopal episode  . SAH (subarachnoid hemorrhage)     Following syncopal episode  . Kidney stones     "passed it"  . Chronic bronchitis     "get it pretty much q yr" (05/11/2014)  . SLEEP APNEA, OBSTRUCTIVE     "  don't wear mask" (05/11/2014)  . HYPOTHYROIDISM   . DEPRESSION   . Type II diabetes mellitus   . Seizures     just once a few weeks ago" (05/11/2014)  . CAD (coronary artery disease)     Past Surgical History  Procedure Laterality Date  . Colonoscopy    . Polypectomy    . Coronary angioplasty with stent placement  05/11/2014    "1"    History   Social History  . Marital Status: Married    Spouse Name: N/A    Number of Children: 3  . Years of Education: N/A   Occupational History  . [phone] ([employer])   . 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 Smoking status: Former Smoker -- 1.00 packs/day for 30 years    Types: Cigarettes    Quit date: 10/15/1983  . Smokeless tobacco: Never Used  . Alcohol Use: 0.6 oz/week    1 Shots of liquor per week  . Drug Use: No  . Sexual Activity: Yes   Other Topics Concern  . Not on file   Social History Narrative   3 daughters 80, 4, 72 and 2 grandchildren    ROS: no fevers or chills, productive cough, hemoptysis, dysphasia, odynophagia, melena, hematochezia, dysuria, hematuria, rash, seizure activity, orthopnea, PND, pedal edema, claudication. Remaining systems are negative.  Physical Exam: Well-developed well-nourished in no acute distress.  Skin is warm and dry.  HEENT is normal.  Neck is supple.  Chest is clear to auscultation with normal expansion.  Cardiovascular exam is regular rate and rhythm.  Abdominal exam nontender or distended. No masses palpated. Extremities show no edema. neuro grossly intact

## 2014-06-07 NOTE — Assessment & Plan Note (Signed)
Plan repeat abdominal ultrasound June 2016

## 2014-06-08 ENCOUNTER — Encounter (HOSPITAL_COMMUNITY)
Admission: RE | Admit: 2014-06-08 | Discharge: 2014-06-08 | Disposition: A | Discharge status: Home / Self Care | Payer: Managed Care, Other (non HMO) | Source: Ambulatory Visit | Attending: Cardiology | Admitting: Cardiology

## 2014-06-08 ENCOUNTER — Telehealth: Payer: Self-pay | Admitting: Cardiology

## 2014-06-08 DIAGNOSIS — Z5189 Encounter for other specified aftercare: Secondary | ICD-10-CM | POA: Diagnosis not present

## 2014-06-08 LAB — GLUCOSE, CAPILLARY
GLUCOSE-CAPILLARY: 101 mg/dL — AB (ref 70–99)
GLUCOSE-CAPILLARY: 91 mg/dL (ref 70–99)

## 2014-06-08 NOTE — Telephone Encounter (Signed)
Pt need new prescriptions for Lisinopril 2.5 mg #90 and Atorvastatin 80 mg #90. Please call to [place].

## 2014-06-10 ENCOUNTER — Telehealth: Payer: Self-pay | Admitting: *Deleted

## 2014-06-10 ENCOUNTER — Encounter (HOSPITAL_COMMUNITY)
Admission: RE | Admit: 2014-06-10 | Discharge: 2014-06-10 | Disposition: A | Discharge status: Home / Self Care | Payer: Managed Care, Other (non HMO) | Source: Ambulatory Visit | Attending: Cardiology | Admitting: Cardiology

## 2014-06-10 DIAGNOSIS — Z5189 Encounter for other specified aftercare: Secondary | ICD-10-CM | POA: Diagnosis not present

## 2014-06-10 LAB — GLUCOSE, CAPILLARY
GLUCOSE-CAPILLARY: 114 mg/dL — AB (ref 70–99)
Glucose-Capillary: 101 mg/dL — ABNORMAL HIGH (ref 70–99)

## 2014-06-10 NOTE — Telephone Encounter (Signed)
I called and LMVM for pt that calling about form Dr. Leonie Man filling out.  ? Back at work.  Please call back at his convenience.

## 2014-06-10 NOTE — Progress Notes (Signed)
Allen Myers 68 y.o. male Nutrition Note Spoke with pt.  Nutrition Plan and Nutrition Survey goals reviewed with pt. Pt reports he has made "a lot of changes to my diet." According to pt's MEDFICTS results, pt is not currently following the Therapeutic Lifestyle Changes diet despite dietary changes. Pt encouraged to continue making dietary changes slowly and purposefully. Pt wants to lose wt. Pt reports he has lost 30 lbs since changing his diet. Pt has been trying to lose wt by "watching my portion sizes." Wt loss tips reviewed.  Pt is diabetic. Last A1c indicates pt blood glucose poorly controlled. Per pt, pt checks his CBG's BID. Fasting CBG's reportedly 105-122 mg/dL. Reviewed recommended fasting blood glucose range since pt stated he was not sure what his CBG's should be. Pt denies previously attending DM education classes and states "I pick up information on diabetes here and there." Pt scored 15/15 on DM test, which this writer reviewed with pt. Pt expressed understanding of the information reviewed. Pt aware of nutrition education classes offered and plans on attending nutrition classes. Lab Results  Component Value Date   HGBA1C 9.5* 04/02/2014    Nutrition Diagnosis Food-and nutrition-related knowledge deficit related to lack of exposure to information as related to diagnosis of: ? CVD ? DM (A1c 9.5)  Obesity related to excessive energy intake as evidenced by a BMI of 33.1  Nutrition RX/ Estimated Daily Nutrition Needs for: wt loss  1700-2200 Kcal, 45-60 gm fat, 11-15 gm sat fat, 1.7-2.2 gm trans-fat, <1500 mg sodium, 175-250 gm CHO   Nutrition Intervention   Pt's individual nutrition plan reviewed with pt.   Benefits of adopting Therapeutic Lifestyle Changes discussed when Medficts reviewed.   Pt to attend the Portion Distortion class   Pt to attend the  ? Nutrition I class                     ? Nutrition II class        ? Diabetes Blitz class       ? Diabetes Q & A class   Pt  given handouts for: ? Nutrition I class ? Nutrition II class    Continue client-centered nutrition education by RD, as part of interdisciplinary care. Goal(s)   Pt to identify and limit food sources of saturated fat, trans fat, and cholesterol   Pt to identify food quantities necessary to achieve: ? wt loss to a goal wt of 221-239 lb (100.6-108.8 kg) at graduation from cardiac rehab.    CBG concentrations in the normal range or as close to normal as is safely possible. Monitor and Evaluate progress toward nutrition goal with team. Nutrition Risk: Change to Moderate Derek Mound, M.Ed, RD, LDN, CDE 06/10/2014 11:55 AM

## 2014-06-13 ENCOUNTER — Encounter (HOSPITAL_COMMUNITY)
Admission: RE | Admit: 2014-06-13 | Discharge: 2014-06-13 | Disposition: A | Discharge status: Home / Self Care | Payer: Managed Care, Other (non HMO) | Source: Ambulatory Visit | Attending: Cardiology | Admitting: Cardiology

## 2014-06-13 DIAGNOSIS — Z5189 Encounter for other specified aftercare: Secondary | ICD-10-CM | POA: Diagnosis not present

## 2014-06-13 LAB — GLUCOSE, CAPILLARY: GLUCOSE-CAPILLARY: 99 mg/dL (ref 70–99)

## 2014-06-13 NOTE — Telephone Encounter (Signed)
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    Will relay to Dr. Leonie Man then fax.

## 2014-06-15 ENCOUNTER — Encounter (HOSPITAL_COMMUNITY)
Admission: RE | Admit: 2014-06-15 | Discharge: 2014-06-15 | Disposition: A | Discharge status: Home / Self Care | Payer: Managed Care, Other (non HMO) | Source: Ambulatory Visit | Attending: Cardiology | Admitting: Cardiology

## 2014-06-15 ENCOUNTER — Telehealth: Payer: Self-pay | Admitting: *Deleted

## 2014-06-15 ENCOUNTER — Telehealth: Payer: Self-pay | Admitting: Neurology

## 2014-06-15 DIAGNOSIS — I2510 Atherosclerotic heart disease of native coronary artery without angina pectoris: Secondary | ICD-10-CM | POA: Diagnosis not present

## 2014-06-15 DIAGNOSIS — Z5189 Encounter for other specified aftercare: Secondary | ICD-10-CM | POA: Insufficient documentation

## 2014-06-15 DIAGNOSIS — I714 Abdominal aortic aneurysm, without rupture, unspecified: Secondary | ICD-10-CM | POA: Diagnosis not present

## 2014-06-15 DIAGNOSIS — I428 Other cardiomyopathies: Secondary | ICD-10-CM | POA: Insufficient documentation

## 2014-06-15 DIAGNOSIS — I214 Non-ST elevation (NSTEMI) myocardial infarction: Secondary | ICD-10-CM | POA: Insufficient documentation

## 2014-06-15 DIAGNOSIS — I4891 Unspecified atrial fibrillation: Secondary | ICD-10-CM | POA: Diagnosis present

## 2014-06-15 LAB — GLUCOSE, CAPILLARY
Glucose-Capillary: 111 mg/dL — ABNORMAL HIGH (ref 70–99)
Glucose-Capillary: 81 mg/dL (ref 70–99)

## 2014-06-15 NOTE — Progress Notes (Signed)
PSYCHOSOCIAL ASSESSMENT  Pt psychosocial assessment reveals no barriers to rehab participation.  Pt quality of life is slightly altered by health related anxiety. Pt has significant family history of early cardiac death.  However pt has already lived longer than his ancestors.  This history and current cardiac condition give him cause for concern, however also motivation to make necessary risk factor modifications. Pt verbalizes desire to make dietary and exercise changes.   Pt exhibits positive coping skills and has supportive family.  Offered emotional support and reassurance.  Will continue to monitor.

## 2014-06-15 NOTE — Telephone Encounter (Signed)
Faxed to [facility] on 06/15/14.

## 2014-06-15 NOTE — Telephone Encounter (Signed)
Call patient no answer

## 2014-06-16 ENCOUNTER — Telehealth: Payer: Self-pay | Admitting: *Deleted

## 2014-06-16 LAB — BASIC METABOLIC PANEL WITH GFR
BUN: 14 mg/dL (ref 6–23)
CHLORIDE: 104 meq/L (ref 96–112)
CO2: 25 mEq/L (ref 19–32)
CREATININE: 1.03 mg/dL (ref 0.50–1.35)
Calcium: 9.3 mg/dL (ref 8.4–10.5)
GFR, Est African American: 86 mL/min
GFR, Est Non African American: 74 mL/min
Glucose, Bld: 114 mg/dL — ABNORMAL HIGH (ref 70–99)
Potassium: 4.6 mEq/L (ref 3.5–5.3)
Sodium: 138 mEq/L (ref 135–145)

## 2014-06-16 LAB — LIPID PANEL
CHOL/HDL RATIO: 2.1 ratio
Cholesterol: 77 mg/dL (ref 0–200)
HDL: 36 mg/dL — ABNORMAL LOW (ref >39)
LDL Cholesterol: 25 mg/dL (ref 0–99)
Triglycerides: 79 mg/dL (ref <150)
VLDL: 16 mg/dL (ref 0–40)

## 2014-06-16 LAB — HEPATIC FUNCTION PANEL
ALBUMIN: 4.4 g/dL (ref 3.5–5.2)
ALT: 23 U/L (ref 0–53)
AST: 26 U/L (ref 0–37)
Alkaline Phosphatase: 77 U/L (ref 39–117)
BILIRUBIN TOTAL: 0.9 mg/dL (ref 0.2–1.2)
Bilirubin, Direct: 0.2 mg/dL (ref 0.0–0.3)
Indirect Bilirubin: 0.7 mg/dL (ref 0.2–1.2)
Total Protein: 7.1 g/dL (ref 6.0–8.3)

## 2014-06-16 NOTE — Telephone Encounter (Signed)
Notes fax on 06/16/14 to [employer].

## 2014-06-17 ENCOUNTER — Encounter (HOSPITAL_COMMUNITY)
Admission: RE | Admit: 2014-06-17 | Discharge: 2014-06-17 | Disposition: A | Discharge status: Home / Self Care | Payer: Managed Care, Other (non HMO) | Source: Ambulatory Visit | Attending: Cardiology | Admitting: Cardiology

## 2014-06-17 ENCOUNTER — Encounter: Payer: Self-pay | Admitting: *Deleted

## 2014-06-20 ENCOUNTER — Encounter (HOSPITAL_COMMUNITY): Payer: Managed Care, Other (non HMO)

## 2014-06-22 ENCOUNTER — Encounter (HOSPITAL_COMMUNITY)
Admission: RE | Admit: 2014-06-22 | Discharge: 2014-06-22 | Disposition: A | Discharge status: Home / Self Care | Payer: Managed Care, Other (non HMO) | Source: Ambulatory Visit | Attending: Cardiology | Admitting: Cardiology

## 2014-06-22 ENCOUNTER — Other Ambulatory Visit: Payer: Self-pay

## 2014-06-22 DIAGNOSIS — E785 Hyperlipidemia, unspecified: Secondary | ICD-10-CM

## 2014-06-22 DIAGNOSIS — I429 Cardiomyopathy, unspecified: Secondary | ICD-10-CM

## 2014-06-22 DIAGNOSIS — Z5189 Encounter for other specified aftercare: Secondary | ICD-10-CM | POA: Diagnosis not present

## 2014-06-22 LAB — GLUCOSE, CAPILLARY: GLUCOSE-CAPILLARY: 95 mg/dL (ref 70–99)

## 2014-06-22 MED ORDER — ATORVASTATIN CALCIUM 80 MG PO TABS: 80.0000 mg | ORAL_TABLET | Freq: Every day | ORAL | Status: DC

## 2014-06-22 MED ORDER — LISINOPRIL 2.5 MG PO TABS: 2.5000 mg | ORAL_TABLET | Freq: Every day | ORAL | Status: DC

## 2014-06-24 ENCOUNTER — Encounter (HOSPITAL_COMMUNITY)
Admission: RE | Admit: 2014-06-24 | Discharge: 2014-06-24 | Disposition: A | Discharge status: Home / Self Care | Payer: Managed Care, Other (non HMO) | Source: Ambulatory Visit | Attending: Cardiology | Admitting: Cardiology

## 2014-06-24 DIAGNOSIS — Z5189 Encounter for other specified aftercare: Secondary | ICD-10-CM | POA: Diagnosis not present

## 2014-06-24 LAB — GLUCOSE, CAPILLARY: Glucose-Capillary: 94 mg/dL (ref 70–99)

## 2014-06-27 ENCOUNTER — Encounter (HOSPITAL_COMMUNITY)
Admission: RE | Admit: 2014-06-27 | Discharge: 2014-06-27 | Disposition: A | Discharge status: Home / Self Care | Payer: Managed Care, Other (non HMO) | Source: Ambulatory Visit | Attending: Cardiology | Admitting: Cardiology

## 2014-06-27 DIAGNOSIS — Z5189 Encounter for other specified aftercare: Secondary | ICD-10-CM | POA: Diagnosis not present

## 2014-06-27 LAB — GLUCOSE, CAPILLARY
GLUCOSE-CAPILLARY: 110 mg/dL — AB (ref 70–99)
Glucose-Capillary: 94 mg/dL (ref 70–99)

## 2014-06-27 NOTE — Progress Notes (Signed)
I have reviewed home exercise with Allen Myers. The patient was advised to his exercise regimen at the [facility] of walking or biking 2-4 days per week outside of CRP II for 30-45 minutes continuously.  Pt will also complete one additional day of hand weights outside of CRP II.  Progression of exercise prescription was discussed.  Reviewed THR, pulse, RPE, sign and symptoms, NTG use and when to call 911 or MD.  Pt voiced understanding.  [id number] Archie Endo, MS, ACSM RCEP 06/27/2014 4:14 PM

## 2014-06-28 ENCOUNTER — Other Ambulatory Visit: Payer: Self-pay | Admitting: *Deleted

## 2014-06-28 DIAGNOSIS — I429 Cardiomyopathy, unspecified: Secondary | ICD-10-CM

## 2014-06-28 DIAGNOSIS — E785 Hyperlipidemia, unspecified: Secondary | ICD-10-CM

## 2014-06-28 MED ORDER — LISINOPRIL 2.5 MG PO TABS: 2.5000 mg | ORAL_TABLET | Freq: Every day | ORAL | Status: DC

## 2014-06-28 MED ORDER — ATORVASTATIN CALCIUM 80 MG PO TABS: 80.0000 mg | ORAL_TABLET | Freq: Every day | ORAL | Status: DC

## 2014-06-29 ENCOUNTER — Encounter (HOSPITAL_COMMUNITY)
Admission: RE | Admit: 2014-06-29 | Discharge: 2014-06-29 | Disposition: A | Discharge status: Home / Self Care | Payer: Managed Care, Other (non HMO) | Source: Ambulatory Visit | Attending: Cardiology | Admitting: Cardiology

## 2014-06-29 DIAGNOSIS — Z5189 Encounter for other specified aftercare: Secondary | ICD-10-CM | POA: Diagnosis not present

## 2014-06-29 LAB — GLUCOSE, CAPILLARY
GLUCOSE-CAPILLARY: 107 mg/dL — AB (ref 70–99)
GLUCOSE-CAPILLARY: 94 mg/dL (ref 70–99)

## 2014-07-01 ENCOUNTER — Encounter (HOSPITAL_COMMUNITY)
Admission: RE | Admit: 2014-07-01 | Discharge: 2014-07-01 | Disposition: A | Discharge status: Home / Self Care | Payer: Managed Care, Other (non HMO) | Source: Ambulatory Visit | Attending: Cardiology | Admitting: Cardiology

## 2014-07-01 DIAGNOSIS — Z5189 Encounter for other specified aftercare: Secondary | ICD-10-CM | POA: Diagnosis not present

## 2014-07-01 LAB — GLUCOSE, CAPILLARY
Glucose-Capillary: 111 mg/dL — ABNORMAL HIGH (ref 70–99)
Glucose-Capillary: 99 mg/dL (ref 70–99)

## 2014-07-04 ENCOUNTER — Encounter (HOSPITAL_COMMUNITY)
Admission: RE | Admit: 2014-07-04 | Discharge: 2014-07-04 | Disposition: A | Discharge status: Home / Self Care | Payer: Managed Care, Other (non HMO) | Source: Ambulatory Visit | Attending: Cardiology | Admitting: Cardiology

## 2014-07-04 DIAGNOSIS — Z5189 Encounter for other specified aftercare: Secondary | ICD-10-CM | POA: Diagnosis not present

## 2014-07-04 LAB — GLUCOSE, CAPILLARY: GLUCOSE-CAPILLARY: 96 mg/dL (ref 70–99)

## 2014-07-05 ENCOUNTER — Other Ambulatory Visit: Payer: Self-pay

## 2014-07-06 ENCOUNTER — Encounter: Payer: Managed Care, Other (non HMO) | Admitting: Internal Medicine

## 2014-07-06 ENCOUNTER — Encounter (HOSPITAL_COMMUNITY)
Admission: RE | Admit: 2014-07-06 | Discharge: 2014-07-06 | Disposition: A | Discharge status: Home / Self Care | Payer: Managed Care, Other (non HMO) | Source: Ambulatory Visit | Attending: Cardiology | Admitting: Cardiology

## 2014-07-06 DIAGNOSIS — Z5189 Encounter for other specified aftercare: Secondary | ICD-10-CM | POA: Diagnosis not present

## 2014-07-06 LAB — GLUCOSE, CAPILLARY: Glucose-Capillary: 88 mg/dL (ref 70–99)

## 2014-07-07 DIAGNOSIS — Z0279 Encounter for issue of other medical certificate: Secondary | ICD-10-CM

## 2014-07-08 ENCOUNTER — Encounter (HOSPITAL_COMMUNITY)
Admission: RE | Admit: 2014-07-08 | Discharge: 2014-07-08 | Disposition: A | Discharge status: Home / Self Care | Payer: Managed Care, Other (non HMO) | Source: Ambulatory Visit | Attending: Cardiology | Admitting: Cardiology

## 2014-07-08 DIAGNOSIS — Z5189 Encounter for other specified aftercare: Secondary | ICD-10-CM | POA: Diagnosis not present

## 2014-07-08 LAB — GLUCOSE, CAPILLARY: Glucose-Capillary: 115 mg/dL — ABNORMAL HIGH (ref 70–99)

## 2014-07-11 ENCOUNTER — Other Ambulatory Visit: Payer: Self-pay | Admitting: Internal Medicine

## 2014-07-11 ENCOUNTER — Encounter (HOSPITAL_COMMUNITY)
Admission: RE | Admit: 2014-07-11 | Discharge: 2014-07-11 | Disposition: A | Discharge status: Home / Self Care | Payer: Managed Care, Other (non HMO) | Source: Ambulatory Visit | Attending: Cardiology | Admitting: Cardiology

## 2014-07-11 DIAGNOSIS — Z5189 Encounter for other specified aftercare: Secondary | ICD-10-CM | POA: Diagnosis not present

## 2014-07-11 LAB — GLUCOSE, CAPILLARY: Glucose-Capillary: 103 mg/dL — ABNORMAL HIGH (ref 70–99)

## 2014-07-13 ENCOUNTER — Other Ambulatory Visit (INDEPENDENT_AMBULATORY_CARE_PROVIDER_SITE_OTHER): Payer: Managed Care, Other (non HMO)

## 2014-07-13 ENCOUNTER — Encounter (HOSPITAL_COMMUNITY)
Admission: RE | Admit: 2014-07-13 | Discharge: 2014-07-13 | Disposition: A | Discharge status: Home / Self Care | Payer: Managed Care, Other (non HMO) | Source: Ambulatory Visit | Attending: Cardiology | Admitting: Cardiology

## 2014-07-13 DIAGNOSIS — IMO0001 Reserved for inherently not codable concepts without codable children: Secondary | ICD-10-CM

## 2014-07-13 DIAGNOSIS — Z5189 Encounter for other specified aftercare: Secondary | ICD-10-CM | POA: Diagnosis not present

## 2014-07-13 DIAGNOSIS — E1165 Type 2 diabetes mellitus with hyperglycemia: Principal | ICD-10-CM

## 2014-07-13 LAB — HEMOGLOBIN A1C: Hgb A1c MFr Bld: 6.7 % — ABNORMAL HIGH (ref 4.6–6.5)

## 2014-07-13 LAB — LIPID PANEL
CHOL/HDL RATIO: 2
Cholesterol: 77 mg/dL (ref 0–200)
HDL: 34.7 mg/dL — AB (ref >39.00)
LDL Cholesterol: 32 mg/dL (ref 0–99)
NonHDL: 42.3
TRIGLYCERIDES: 51 mg/dL (ref 0.0–149.0)
VLDL: 10.2 mg/dL (ref 0.0–40.0)

## 2014-07-13 LAB — BASIC METABOLIC PANEL
BUN: 14 mg/dL (ref 6–23)
CO2: 24 mEq/L (ref 19–32)
CREATININE: 0.9 mg/dL (ref 0.4–1.5)
Calcium: 9.3 mg/dL (ref 8.4–10.5)
Chloride: 107 mEq/L (ref 96–112)
GFR: 92.69 mL/min (ref >60.00)
Glucose, Bld: 110 mg/dL — ABNORMAL HIGH (ref 70–99)
Potassium: 4.8 mEq/L (ref 3.5–5.1)
Sodium: 141 mEq/L (ref 135–145)

## 2014-07-13 LAB — HEPATIC FUNCTION PANEL
ALT: 34 U/L (ref 0–53)
AST: 34 U/L (ref 0–37)
Albumin: 4.1 g/dL (ref 3.5–5.2)
Alkaline Phosphatase: 82 U/L (ref 39–117)
Bilirubin, Direct: 0.1 mg/dL (ref 0.0–0.3)
Total Bilirubin: 0.7 mg/dL (ref 0.2–1.2)
Total Protein: 7.7 g/dL (ref 6.0–8.3)

## 2014-07-13 LAB — GLUCOSE, CAPILLARY
GLUCOSE-CAPILLARY: 76 mg/dL (ref 70–99)
Glucose-Capillary: 97 mg/dL (ref 70–99)

## 2014-07-14 ENCOUNTER — Telehealth: Payer: Self-pay | Admitting: Internal Medicine

## 2014-07-14 MED ORDER — ACYCLOVIR 200 MG PO CAPS: 200.0000 mg | ORAL_CAPSULE | Freq: Every day | ORAL | Status: DC

## 2014-07-14 NOTE — Telephone Encounter (Signed)
Patient would like to speak with Robin.  He is requesting a script for cold sores to be called in to [facility] on [address].

## 2014-07-14 NOTE — Telephone Encounter (Signed)
Patient informed (left message)

## 2014-07-14 NOTE — Telephone Encounter (Signed)
This has been done.

## 2014-07-15 ENCOUNTER — Encounter (HOSPITAL_COMMUNITY): Payer: Managed Care, Other (non HMO)

## 2014-07-18 ENCOUNTER — Encounter (HOSPITAL_COMMUNITY)
Admission: RE | Admit: 2014-07-18 | Discharge: 2014-07-18 | Disposition: A | Discharge status: Home / Self Care | Payer: Managed Care, Other (non HMO) | Source: Ambulatory Visit | Attending: Cardiology | Admitting: Cardiology

## 2014-07-18 ENCOUNTER — Encounter: Payer: Self-pay | Admitting: Cardiology

## 2014-07-18 DIAGNOSIS — Z955 Presence of coronary angioplasty implant and graft: Secondary | ICD-10-CM | POA: Insufficient documentation

## 2014-07-18 DIAGNOSIS — I2510 Atherosclerotic heart disease of native coronary artery without angina pectoris: Secondary | ICD-10-CM | POA: Insufficient documentation

## 2014-07-18 DIAGNOSIS — E119 Type 2 diabetes mellitus without complications: Secondary | ICD-10-CM | POA: Insufficient documentation

## 2014-07-18 LAB — GLUCOSE, CAPILLARY
Glucose-Capillary: 136 mg/dL — ABNORMAL HIGH (ref 70–99)
Glucose-Capillary: 94 mg/dL (ref 70–99)

## 2014-07-18 NOTE — Telephone Encounter (Signed)
Allen Myers has some questions about his Echo on tomorrow , Please call   Thanks

## 2014-07-18 NOTE — Telephone Encounter (Signed)
This encounter was created in error - please disregard.

## 2014-07-19 ENCOUNTER — Ambulatory Visit (HOSPITAL_COMMUNITY): Payer: Managed Care, Other (non HMO)

## 2014-07-19 ENCOUNTER — Ambulatory Visit (HOSPITAL_COMMUNITY)
Admission: RE | Admit: 2014-07-19 | Discharge: 2014-07-19 | Disposition: A | Discharge status: Home / Self Care | Payer: Managed Care, Other (non HMO) | Source: Ambulatory Visit | Attending: Cardiovascular Disease | Admitting: Cardiovascular Disease

## 2014-07-19 DIAGNOSIS — I714 Abdominal aortic aneurysm, without rupture: Secondary | ICD-10-CM | POA: Diagnosis not present

## 2014-07-19 DIAGNOSIS — R55 Syncope and collapse: Secondary | ICD-10-CM | POA: Diagnosis not present

## 2014-07-19 DIAGNOSIS — I429 Cardiomyopathy, unspecified: Secondary | ICD-10-CM

## 2014-07-19 DIAGNOSIS — E785 Hyperlipidemia, unspecified: Secondary | ICD-10-CM | POA: Insufficient documentation

## 2014-07-19 DIAGNOSIS — I428 Other cardiomyopathies: Secondary | ICD-10-CM | POA: Insufficient documentation

## 2014-07-19 DIAGNOSIS — I369 Nonrheumatic tricuspid valve disorder, unspecified: Secondary | ICD-10-CM | POA: Diagnosis not present

## 2014-07-19 NOTE — Progress Notes (Signed)
2D Echo Performed 07/19/2014    Omunique Pederson, RCS  

## 2014-07-20 ENCOUNTER — Encounter (HOSPITAL_COMMUNITY)
Admission: RE | Admit: 2014-07-20 | Discharge: 2014-07-20 | Disposition: A | Discharge status: Home / Self Care | Payer: Managed Care, Other (non HMO) | Source: Ambulatory Visit | Attending: Cardiology | Admitting: Cardiology

## 2014-07-20 DIAGNOSIS — Z955 Presence of coronary angioplasty implant and graft: Secondary | ICD-10-CM | POA: Diagnosis not present

## 2014-07-20 LAB — GLUCOSE, CAPILLARY: GLUCOSE-CAPILLARY: 140 mg/dL — AB (ref 70–99)

## 2014-07-22 ENCOUNTER — Encounter (HOSPITAL_COMMUNITY)
Admission: RE | Admit: 2014-07-22 | Discharge: 2014-07-22 | Disposition: A | Discharge status: Home / Self Care | Payer: Managed Care, Other (non HMO) | Source: Ambulatory Visit | Attending: Cardiology | Admitting: Cardiology

## 2014-07-22 DIAGNOSIS — Z955 Presence of coronary angioplasty implant and graft: Secondary | ICD-10-CM | POA: Diagnosis not present

## 2014-07-22 LAB — GLUCOSE, CAPILLARY: Glucose-Capillary: 102 mg/dL — ABNORMAL HIGH (ref 70–99)

## 2014-07-25 ENCOUNTER — Encounter (HOSPITAL_COMMUNITY)
Admission: RE | Admit: 2014-07-25 | Discharge: 2014-07-25 | Disposition: A | Discharge status: Home / Self Care | Payer: Managed Care, Other (non HMO) | Source: Ambulatory Visit | Attending: Cardiology | Admitting: Cardiology

## 2014-07-25 DIAGNOSIS — Z955 Presence of coronary angioplasty implant and graft: Secondary | ICD-10-CM | POA: Diagnosis not present

## 2014-07-25 LAB — GLUCOSE, CAPILLARY
Glucose-Capillary: 118 mg/dL — ABNORMAL HIGH (ref 70–99)
Glucose-Capillary: 119 mg/dL — ABNORMAL HIGH (ref 70–99)

## 2014-07-26 ENCOUNTER — Ambulatory Visit (INDEPENDENT_AMBULATORY_CARE_PROVIDER_SITE_OTHER): Payer: Managed Care, Other (non HMO) | Admitting: Internal Medicine

## 2014-07-26 ENCOUNTER — Encounter: Payer: Self-pay | Admitting: Internal Medicine

## 2014-07-26 VITALS — BP 122/68 | HR 70 | Temp 97.6°F | Ht 73.0 in | Wt 235.5 lb

## 2014-07-26 DIAGNOSIS — Z23 Encounter for immunization: Secondary | ICD-10-CM | POA: Diagnosis not present

## 2014-07-26 DIAGNOSIS — I429 Cardiomyopathy, unspecified: Secondary | ICD-10-CM | POA: Diagnosis not present

## 2014-07-26 DIAGNOSIS — Z0000 Encounter for general adult medical examination without abnormal findings: Secondary | ICD-10-CM | POA: Diagnosis not present

## 2014-07-26 DIAGNOSIS — E119 Type 2 diabetes mellitus without complications: Secondary | ICD-10-CM

## 2014-07-26 MED ORDER — LISINOPRIL 2.5 MG PO TABS: 2.5000 mg | ORAL_TABLET | Freq: Every day | ORAL | Status: DC

## 2014-07-26 MED ORDER — CARVEDILOL 3.125 MG PO TABS: 3.1250 mg | ORAL_TABLET | Freq: Two times a day (BID) | ORAL | Status: DC

## 2014-07-26 NOTE — Progress Notes (Signed)
Subjective:    Patient ID: Allen Myers, male    DOB: 04-19-1946, 68 y.o.   MRN: [id number]  HPI  Here for wellness and f/u;  Overall doing ok;  Pt denies CP, worsening SOB, DOE, wheezing, orthopnea, PND, worsening LE edema, palpitations, dizziness or syncope.  Pt denies neurological change such as new headache, facial or extremity weakness.  Pt denies polydipsia, polyuria, or low sugar symptoms. Pt states overall good compliance with treatment and medications, good tolerability, and has been trying to follow lower cholesterol diet.  Pt denies worsening depressive symptoms, suicidal ideation or panic. No fever, night sweats, loss of appetite, or other constitutional symptoms.  Pt states good ability with ADL's, has low fall risk, home safety reviewed and adequate, no other significant changes in hearing or vision, and only occasionally active with exercise, still tyring to recover from recent illness. Back to work fulltime.  Does have low sugar with hard exercise, has to eat to feel better.  Insurance will no longer pay for tradjenta Wt Readings from Last 3 Encounters:  07/26/14 235 lb 8 oz (106.822 kg)  06/07/14 244 lb (110.678 kg)  06/02/14 245 lb 13 oz (111.5 kg)  Overall wt down 35 lbs from the date of the fall Stil some dizzy occasionally with standing too fast, has f/u with neurology soon.  Past Medical History  Diagnosis Date  . ALLERGIC RHINITIS 05/07/2007  . HYPERLIPIDEMIA 05/07/2007  . COLONIC POLYPS, HX OF 12/07/2007  . DIVERTICULITIS, HX OF 05/07/2007  . DIVERTICULOSIS, COLON 12/07/2007  . ERECTILE DYSFUNCTION 05/07/2007  . HYPERTENSION 05/16/2008    pt denies  . Cardiomyopathy   . SDH (subdural hematoma)     Following syncopal episode  . SAH (subarachnoid hemorrhage)     Following syncopal episode  . Kidney stones     "passed it"  . Chronic bronchitis     "get it pretty much q yr" (05/11/2014)  . SLEEP APNEA, OBSTRUCTIVE     "don't wear mask" (05/11/2014)  . HYPOTHYROIDISM   .  DEPRESSION   . Type II diabetes mellitus   . Seizures     just once a few weeks ago" (05/11/2014)  . CAD (coronary artery disease)    Past Surgical History  Procedure Laterality Date  . Colonoscopy    . Polypectomy    . Coronary angioplasty with stent placement  05/11/2014    "1"    reports that he quit smoking about 30 years ago. His smoking use included Cigarettes. He has a 30 pack-year smoking history. He has never used smokeless tobacco. He reports that he drinks about .6 ounces of alcohol per week. He reports that he does not use illicit drugs. family history includes Cancer in his brother and cousin; Heart disease in his father and mother; Lung cancer in his father. There is no history of Colon cancer, Rectal cancer, or Stomach cancer. No Known Allergies Current Outpatient Prescriptions on File Prior to Visit  Medication Sig Dispense Refill  . acyclovir (ZOVIRAX) 200 MG capsule Take 1 capsule (200 mg total) by mouth 5 (five) times daily. For 5 days  25 capsule  5  . aspirin EC 81 MG tablet Take 1 tablet (81 mg total) by mouth daily.  90 tablet  3  . atorvastatin (LIPITOR) 80 MG tablet Take 1 tablet (80 mg total) by mouth daily.  90 tablet  1  . carvedilol (COREG) 6.25 MG tablet Take 0.5 tablets (3.125 mg total) by mouth 2 (two) times  daily with a meal.  180 tablet  3  . glucose blood (ONE TOUCH TEST STRIPS) test strip Use as directed twice daily to check blood sugar.  Diagnosis code 250.00  100 each  11  . levETIRAcetam (KEPPRA) 500 MG tablet Take 1 tablet (500 mg total) by mouth 2 (two) times daily.  180 tablet  3  . levothyroxine (SYNTHROID, LEVOTHROID) 100 MCG tablet Take 1 tablet (100 mcg total) by mouth daily before breakfast.  90 tablet  3  . linagliptin (TRADJENTA) 5 MG TABS tablet Take 1 tablet (5 mg total) by mouth daily.  90 tablet  3  . metFORMIN (GLUCOPHAGE) 1000 MG tablet Take 1 tablet (1,000 mg total) by mouth 2 (two) times daily with a meal.  180 tablet  3  .  nitroGLYCERIN (NITROSTAT) 0.4 MG SL tablet Place 1 tablet (0.4 mg total) under the tongue every 5 (five) minutes as needed for chest pain.  25 tablet  12  . pioglitazone (ACTOS) 45 MG tablet Take 1 tablet (45 mg total) by mouth daily.  90 tablet  3  . ticagrelor (BRILINTA) 90 MG TABS tablet Take 1 tablet (90 mg total) by mouth 2 (two) times daily.  60 tablet  12   No current facility-administered medications on file prior to visit.   Review of Systems Constitutional: Negative for increased diaphoresis, other activity, appetite or other siginficant weight change  HENT: Negative for worsening hearing loss, ear pain, facial swelling, mouth sores and neck stiffness.   Eyes: Negative for other worsening pain, redness or visual disturbance.  Respiratory: Negative for shortness of breath and wheezing.   Cardiovascular: Negative for chest pain and palpitations.  Gastrointestinal: Negative for diarrhea, blood in stool, abdominal distention or other pain Genitourinary: Negative for hematuria, flank pain or change in urine volume.  Musculoskeletal: Negative for myalgias or other joint complaints.  Skin: Negative for color change and wound.  Neurological: Negative for syncope and numbness. other than noted Hematological: Negative for adenopathy. or other swelling Psychiatric/Behavioral: Negative for hallucinations, self-injury, decreased concentration or other worsening agitation.      Objective:   Physical Exam BP 122/68  Pulse 70  Temp(Src) 97.6 F (36.4 C) (Oral)  Ht 6\' 1"  (1.854 m)  Wt 235 lb 8 oz (106.822 kg)  BMI 31.08 kg/m2  SpO2 95% VS noted,  Constitutional: Pt is oriented to person, place, and time. Appears well-developed and well-nourished.  Head: Normocephalic and atraumatic.  Right Ear: External ear normal.  Left Ear: External ear normal.  Nose: Nose normal.  Mouth/Throat: Oropharynx is clear and moist.  Eyes: Conjunctivae and EOM are normal. Pupils are equal, round, and  reactive to light.  Neck: Normal range of motion. Neck supple. No JVD present. No tracheal deviation present.  Cardiovascular: Normal rate, regular rhythm, normal heart sounds and intact distal pulses.   Pulmonary/Chest: Effort normal and breath sounds without rales or wheezing  Abdominal: Soft. Bowel sounds are normal. NT. No HSM  Musculoskeletal: Normal range of motion. Exhibits no edema.  Lymphadenopathy:  Has no cervical adenopathy.  Neurological: Pt is alert and oriented to person, place, and time. Pt has normal reflexes. No cranial nerve deficit. Motor grossly intact Skin: Skin is warm and dry. No rash noted.  Psychiatric:  Has normal mood and affect. Behavior is normal.      Assessment & Plan:

## 2014-07-26 NOTE — Patient Instructions (Addendum)
You had the flu shot today  Please return for Nurse Visit in 2 wks for the new Prevnar pneumonia shot  OK to stop the tradjenta as your sugars are improved  Please continue all other medications as before, and refills have been done if requested.  Please have the pharmacy call with any other refills you may need.  Please continue your efforts at being more active, low cholesterol diet, and weight control.  You are otherwise up to date with prevention measures today.  Please keep your appointments with your specialists as you may have planned  Please return in 3 months, or sooner if needed, with Lab testing done 3-5 days before

## 2014-07-26 NOTE — Progress Notes (Signed)
Pre visit review using our clinic review tool, if applicable. No additional management support is needed unless otherwise documented below in the visit note. 

## 2014-07-26 NOTE — Assessment & Plan Note (Signed)
Ok to stop the tradjenta as is ? Mild overcontrolled, cont all other meds, wt loss, and f/u next visit

## 2014-07-26 NOTE — Assessment & Plan Note (Signed)

## 2014-07-27 ENCOUNTER — Encounter (HOSPITAL_COMMUNITY)
Admission: RE | Admit: 2014-07-27 | Discharge: 2014-07-27 | Disposition: A | Discharge status: Home / Self Care | Payer: Managed Care, Other (non HMO) | Source: Ambulatory Visit | Attending: Cardiology | Admitting: Cardiology

## 2014-07-27 DIAGNOSIS — Z955 Presence of coronary angioplasty implant and graft: Secondary | ICD-10-CM | POA: Diagnosis not present

## 2014-07-27 LAB — GLUCOSE, CAPILLARY: Glucose-Capillary: 114 mg/dL — ABNORMAL HIGH (ref 70–99)

## 2014-07-28 ENCOUNTER — Ambulatory Visit (INDEPENDENT_AMBULATORY_CARE_PROVIDER_SITE_OTHER): Payer: Managed Care, Other (non HMO) | Admitting: Neurology

## 2014-07-28 ENCOUNTER — Encounter: Payer: Self-pay | Admitting: Neurology

## 2014-07-28 VITALS — BP 94/57 | HR 61 | Ht 73.0 in | Wt 234.8 lb

## 2014-07-28 DIAGNOSIS — R569 Unspecified convulsions: Secondary | ICD-10-CM

## 2014-07-28 NOTE — Patient Instructions (Signed)
I had a long discussion the patient was regards to his seizure, head injury and counseled him to be compliant with taking Keppra 500 mg twice daily. He was advised not to drive for the next 2 months. Return for followup in 6 months with Charlott Holler, NP. call earlier if necessary

## 2014-07-28 NOTE — Progress Notes (Signed)
[facility] [address] [place]. [place] [address] [phone]       OFFICE FOLLOW UP VISIT  NOTE  [name] Date of Birth:  23-Jul-1946 Medical Record Number:  [id number]   Referring MD:  Cathlean Cower  Reason for Referral:  Seizure HPI: 19 year Caucasian male who had episode of brief loss of consciousness on 04/01/14. He was raking some leaves on the roof of his house when he states that he felt dizzy and lost balance and fell down and hit his head. He must have lost consciousness for a while. His wife called his neighbor who climbed on the roof and noticed that the patient was all pale and bluish and had some jerking. He was found to have multiple rib fractures and L1 transverse process fracture. CT scan of the head showed a small right convexity subdural hematoma and superficial left temporoparietal occipital subarachnoid hemorrhage. He was treated conservatively and remained stable. He subsequently had a witnessed generalized tonic-clonic seizure the next after admission and required temporary intubation for her for protection. He was started on Keppra and had no further seizures during hospitalization. Followup CT scan of the brain which I have personally reviewed showed stable appearance. For repeat followup CT scan done on 04/28/14 which I have personally reviewed shows complete resolution of the tiny subdural and somewhat subarachnoid bleeds. He states is done well healed has had no further seizures. His headaches have resolved and he has not taken any headache medications for more than 2-3 weeks. He has no focal neurological symptoms or deficits. He does however complain of orthostatic dizziness. His blood pressure in fact today is recorded twice is 80/54. He does have mild orthostasis symptoms when he stands up. He denies gait imbalance, double vision or vertigo. He has not had any prior history of strokes or TIAs. He seems to be tolerating Keppra 500 twice daily well  without major side effects. He had cardiac catheterization done last week by Dr. Daneen Schick and had a stent placed. He is currently on aspirin 81 mg ambulance the. He states he has not had any history of hyperglycemia sugars are well controlled and his blood sugar on admission was 318 mg percent. UPDATE 07/28/2014 : He returns for followup of her last visit 2 months ago. He has not had any recurrent seizures. He has been tolerating Keppra quite well without any side effects. He states his orthostatic dizziness symptoms are much improved and probably intermittently. He does do orthostatic problems exercises. He underwent followup MRI scan of the brain on 05/28/14 which I have personally reviewed and shows only a thin rim of resolving subdural hematoma over the right convexity. MRA of the brain shows no large vessel stenosis, occlusion or aneurysms . EEG done on 05/17/14 which I have personally reviewed was normal without epileptiform activity noted ROS:   14 system review of systems is positive for dizziness,   fatigue, spinning sensation, snoring and all other systems negative  PMH:  Past Medical History  Diagnosis Date  . ALLERGIC RHINITIS 05/07/2007  . HYPERLIPIDEMIA 05/07/2007  . COLONIC POLYPS, HX OF 12/07/2007  . DIVERTICULITIS, HX OF 05/07/2007  . DIVERTICULOSIS, COLON 12/07/2007  . ERECTILE DYSFUNCTION 05/07/2007  . HYPERTENSION 05/16/2008    pt denies  . Cardiomyopathy   . SDH (subdural hematoma)     Following syncopal episode  . SAH (subarachnoid hemorrhage)     Following syncopal episode  . Kidney stones     "passed it"  .  Chronic bronchitis     "get it pretty much q yr" (05/11/2014)  . SLEEP APNEA, OBSTRUCTIVE     "don't wear mask" (05/11/2014)  . HYPOTHYROIDISM   . DEPRESSION   . Type II diabetes mellitus   . Seizures     just once a few weeks ago" (05/11/2014)  . CAD (coronary artery disease)     Social History:  History   Social History  . Marital Status: Married    Spouse  Name: Beverlee Nims    Number of Children: 3  . Years of Education: 12   Occupational History  . [phone] (CELL)   . [employer] based in [place]   . [phone] (CELL)    Social History Main Topics  . Smoking status: Former Smoker -- 1.00 packs/day for 30 years    Types: Cigarettes    Quit date: 10/15/1983  . Smokeless tobacco: Never Used  . Alcohol Use: 0.6 oz/week    1 Shots of liquor per week  . Drug Use: No  . Sexual Activity: Yes   Other Topics Concern  . Not on file   Social History Narrative   3 daughters 53, 43, 20 and 2 grandchildren    Medications:   Current Outpatient Prescriptions on File Prior to Visit  Medication Sig Dispense Refill  . acyclovir (ZOVIRAX) 200 MG capsule Take 1 capsule (200 mg total) by mouth 5 (five) times daily. For 5 days  25 capsule  5  . aspirin EC 81 MG tablet Take 1 tablet (81 mg total) by mouth daily.  90 tablet  3  . atorvastatin (LIPITOR) 80 MG tablet Take 1 tablet (80 mg total) by mouth daily.  90 tablet  1  . carvedilol (COREG) 3.125 MG tablet Take 1 tablet (3.125 mg total) by mouth 2 (two) times daily with a meal.  180 tablet  3  . glucose blood (ONE TOUCH TEST STRIPS) test strip Use as directed twice daily to check blood sugar.  Diagnosis code 250.00  100 each  11  . levETIRAcetam (KEPPRA) 500 MG tablet Take 1 tablet (500 mg total) by mouth 2 (two) times daily.  180 tablet  3  . levothyroxine (SYNTHROID, LEVOTHROID) 100 MCG tablet Take 1 tablet (100 mcg total) by mouth daily before breakfast.  90 tablet  3  . lisinopril (PRINIVIL,ZESTRIL) 2.5 MG tablet Take 1 tablet (2.5 mg total) by mouth daily.  90 tablet  3  . metFORMIN (GLUCOPHAGE) 1000 MG tablet Take 1 tablet (1,000 mg total) by mouth 2 (two) times daily with a meal.  180 tablet  3  . nitroGLYCERIN (NITROSTAT) 0.4 MG SL tablet Place 1 tablet (0.4 mg total) under the tongue every 5 (five) minutes as needed for chest pain.  25 tablet  12  . pioglitazone (ACTOS) 45 MG tablet Take 1  tablet (45 mg total) by mouth daily.  90 tablet  3  . ticagrelor (BRILINTA) 90 MG TABS tablet Take 1 tablet (90 mg total) by mouth 2 (two) times daily.  60 tablet  12   No current facility-administered medications on file prior to visit.    Allergies:  No Known Allergies  Physical Exam General: Mildly obese elderly Caucasian male, seated, in no evident distress Head: head normocephalic and atraumatic. Orohparynx benign Neck: supple with no carotid or supraclavicular bruits Cardiovascular: regular rate and rhythm, no murmurs Musculoskeletal: no deformity Skin:  no rash/petichiae Vascular:  Normal pulses all extremities Filed Vitals:   07/28/14 1417  BP: 94/57  Pulse: 61    Neurologic Exam Mental Status: Awake and fully alert. Oriented to place and time. Recent and remote memory intact. Attention span, concentration and fund of knowledge appropriate. Mood and affect appropriate.  Cranial Nerves: Fundoscopic exam not done  . Pupils equal, briskly reactive to light. Extraocular movements full without nystagmus. Visual fields full to confrontation. Hearing intact. Facial sensation intact. Face, tongue, palate moves normally and symmetrically.  Motor: Normal bulk and tone. Normal strength in all tested extremity muscles. Sensory.: intact to tough and pinprick and vibratory.  Coordination: Rapid alternating movements normal in all extremities. Finger-to-nose and heel-to-shin performed accurately bilaterally. Gait and Station: Arises from chair without difficulty. Stance is normal. Gait demonstrates normal stride length and balance . Able to heel, toe and tandem walk without difficulty.  Reflexes: 1+ and symmetric. Toes downgoing.    ASSESSMENT: 78 year Caucasian male with brief loss of consciousness on 04/01/14 from possible syncopal episode resulting in small traumatic subdural and subarachnoid bleed followed later by witnessed generalized tonic-clonic seizure.    PLAN:  I had a long  discussion the patient was regards to his seizure, head injury and counseled him to be compliant with taking Keppra 500 mg twice daily. He was advised not to drive for the next 2 months. Return for followup in 6 months with Charlott Holler, NP. call earlier if necessary  Note: This document was prepared with digital dictation and possible smart phrase technology. Any transcriptional errors that result from this process are unintentional.

## 2014-07-29 ENCOUNTER — Encounter (HOSPITAL_COMMUNITY)
Admission: RE | Admit: 2014-07-29 | Discharge: 2014-07-29 | Disposition: A | Discharge status: Home / Self Care | Payer: Managed Care, Other (non HMO) | Source: Ambulatory Visit | Attending: Cardiology | Admitting: Cardiology

## 2014-07-29 DIAGNOSIS — Z955 Presence of coronary angioplasty implant and graft: Secondary | ICD-10-CM | POA: Diagnosis not present

## 2014-07-29 LAB — GLUCOSE, CAPILLARY: Glucose-Capillary: 115 mg/dL — ABNORMAL HIGH (ref 70–99)

## 2014-07-30 ENCOUNTER — Other Ambulatory Visit (HOSPITAL_COMMUNITY): Payer: Self-pay | Admitting: Cardiology

## 2014-08-01 ENCOUNTER — Encounter (HOSPITAL_COMMUNITY)
Admission: RE | Admit: 2014-08-01 | Discharge: 2014-08-01 | Disposition: A | Discharge status: Home / Self Care | Payer: Managed Care, Other (non HMO) | Source: Ambulatory Visit | Attending: Cardiology | Admitting: Cardiology

## 2014-08-01 DIAGNOSIS — Z955 Presence of coronary angioplasty implant and graft: Secondary | ICD-10-CM | POA: Diagnosis not present

## 2014-08-01 LAB — GLUCOSE, CAPILLARY: GLUCOSE-CAPILLARY: 110 mg/dL — AB (ref 70–99)

## 2014-08-03 ENCOUNTER — Encounter (HOSPITAL_COMMUNITY)
Admission: RE | Admit: 2014-08-03 | Discharge: 2014-08-03 | Disposition: A | Discharge status: Home / Self Care | Payer: Managed Care, Other (non HMO) | Source: Ambulatory Visit | Attending: Cardiology | Admitting: Cardiology

## 2014-08-03 DIAGNOSIS — Z955 Presence of coronary angioplasty implant and graft: Secondary | ICD-10-CM | POA: Diagnosis not present

## 2014-08-03 LAB — GLUCOSE, CAPILLARY: Glucose-Capillary: 101 mg/dL — ABNORMAL HIGH (ref 70–99)

## 2014-08-05 ENCOUNTER — Encounter (HOSPITAL_COMMUNITY)
Admission: RE | Admit: 2014-08-05 | Discharge: 2014-08-05 | Disposition: A | Discharge status: Home / Self Care | Payer: Managed Care, Other (non HMO) | Source: Ambulatory Visit | Attending: Cardiology | Admitting: Cardiology

## 2014-08-05 DIAGNOSIS — Z955 Presence of coronary angioplasty implant and graft: Secondary | ICD-10-CM | POA: Diagnosis not present

## 2014-08-05 LAB — GLUCOSE, CAPILLARY: GLUCOSE-CAPILLARY: 103 mg/dL — AB (ref 70–99)

## 2014-08-08 ENCOUNTER — Encounter (HOSPITAL_COMMUNITY)
Admission: RE | Admit: 2014-08-08 | Discharge: 2014-08-08 | Disposition: A | Discharge status: Home / Self Care | Payer: Managed Care, Other (non HMO) | Source: Ambulatory Visit | Attending: Cardiology | Admitting: Cardiology

## 2014-08-08 DIAGNOSIS — Z955 Presence of coronary angioplasty implant and graft: Secondary | ICD-10-CM | POA: Diagnosis not present

## 2014-08-08 LAB — GLUCOSE, CAPILLARY: Glucose-Capillary: 102 mg/dL — ABNORMAL HIGH (ref 70–99)

## 2014-08-10 ENCOUNTER — Ambulatory Visit: Payer: Managed Care, Other (non HMO) | Admitting: *Deleted

## 2014-08-10 ENCOUNTER — Encounter (HOSPITAL_COMMUNITY)
Admission: RE | Admit: 2014-08-10 | Discharge: 2014-08-10 | Disposition: A | Discharge status: Home / Self Care | Payer: Managed Care, Other (non HMO) | Source: Ambulatory Visit | Attending: Cardiology | Admitting: Cardiology

## 2014-08-10 DIAGNOSIS — Z23 Encounter for immunization: Secondary | ICD-10-CM

## 2014-08-10 DIAGNOSIS — Z955 Presence of coronary angioplasty implant and graft: Secondary | ICD-10-CM | POA: Diagnosis not present

## 2014-08-10 LAB — GLUCOSE, CAPILLARY: Glucose-Capillary: 93 mg/dL (ref 70–99)

## 2014-08-10 MED ORDER — PNEUMOCOCCAL 13-VAL CONJ VACC IM SUSP: 0.5000 mL | INTRAMUSCULAR | Status: DC

## 2014-08-12 ENCOUNTER — Encounter (HOSPITAL_COMMUNITY)
Admission: RE | Admit: 2014-08-12 | Discharge: 2014-08-12 | Disposition: A | Discharge status: Home / Self Care | Payer: Managed Care, Other (non HMO) | Source: Ambulatory Visit | Attending: Cardiology | Admitting: Cardiology

## 2014-08-12 DIAGNOSIS — Z955 Presence of coronary angioplasty implant and graft: Secondary | ICD-10-CM | POA: Diagnosis not present

## 2014-08-12 LAB — GLUCOSE, CAPILLARY: GLUCOSE-CAPILLARY: 99 mg/dL (ref 70–99)

## 2014-08-15 ENCOUNTER — Encounter (HOSPITAL_COMMUNITY)
Admission: RE | Admit: 2014-08-15 | Discharge: 2014-08-15 | Disposition: A | Discharge status: Home / Self Care | Payer: Managed Care, Other (non HMO) | Source: Ambulatory Visit | Attending: Cardiology | Admitting: Cardiology

## 2014-08-15 DIAGNOSIS — E119 Type 2 diabetes mellitus without complications: Secondary | ICD-10-CM | POA: Insufficient documentation

## 2014-08-15 DIAGNOSIS — I2510 Atherosclerotic heart disease of native coronary artery without angina pectoris: Secondary | ICD-10-CM | POA: Insufficient documentation

## 2014-08-15 DIAGNOSIS — Z955 Presence of coronary angioplasty implant and graft: Secondary | ICD-10-CM | POA: Diagnosis present

## 2014-08-15 LAB — GLUCOSE, CAPILLARY: Glucose-Capillary: 106 mg/dL — ABNORMAL HIGH (ref 70–99)

## 2014-08-17 ENCOUNTER — Telehealth: Payer: Self-pay | Admitting: Cardiology

## 2014-08-17 ENCOUNTER — Encounter (HOSPITAL_COMMUNITY): Payer: Managed Care, Other (non HMO)

## 2014-08-17 DIAGNOSIS — Z955 Presence of coronary angioplasty implant and graft: Secondary | ICD-10-CM | POA: Diagnosis not present

## 2014-08-17 NOTE — Telephone Encounter (Signed)
Spoke with pt, he has mentioned he would like to cont with the folks at rehab. They told him they would be in contact with [place]. Will watch for paperwork for dr Stanford Breed to sign. Patient voiced understanding

## 2014-08-17 NOTE — Telephone Encounter (Signed)
Pt called in stating that he will be done with his cardiac rehab in a couple of weeks and he is interested in being in the [facility] program. He would like to know what steps he will need to make to sign up for that. Please call   Thanks

## 2014-08-19 ENCOUNTER — Encounter (HOSPITAL_COMMUNITY)
Admission: RE | Admit: 2014-08-19 | Discharge: 2014-08-19 | Disposition: A | Discharge status: Home / Self Care | Payer: Managed Care, Other (non HMO) | Source: Ambulatory Visit | Attending: Cardiology | Admitting: Cardiology

## 2014-08-19 ENCOUNTER — Encounter (HOSPITAL_COMMUNITY): Payer: Managed Care, Other (non HMO)

## 2014-08-19 DIAGNOSIS — Z955 Presence of coronary angioplasty implant and graft: Secondary | ICD-10-CM | POA: Diagnosis not present

## 2014-08-19 LAB — GLUCOSE, CAPILLARY: GLUCOSE-CAPILLARY: 91 mg/dL (ref 70–99)

## 2014-08-22 ENCOUNTER — Encounter (HOSPITAL_COMMUNITY)
Admission: RE | Admit: 2014-08-22 | Discharge: 2014-08-22 | Disposition: A | Discharge status: Home / Self Care | Payer: Managed Care, Other (non HMO) | Source: Ambulatory Visit | Attending: Cardiology | Admitting: Cardiology

## 2014-08-22 DIAGNOSIS — Z955 Presence of coronary angioplasty implant and graft: Secondary | ICD-10-CM | POA: Diagnosis not present

## 2014-08-22 LAB — GLUCOSE, CAPILLARY: Glucose-Capillary: 100 mg/dL — ABNORMAL HIGH (ref 70–99)

## 2014-08-23 LAB — GLUCOSE, CAPILLARY: Glucose-Capillary: 97 mg/dL (ref 70–99)

## 2014-08-24 ENCOUNTER — Encounter (HOSPITAL_COMMUNITY)
Admission: RE | Admit: 2014-08-24 | Discharge: 2014-08-24 | Disposition: A | Discharge status: Home / Self Care | Payer: Managed Care, Other (non HMO) | Source: Ambulatory Visit | Attending: Cardiology | Admitting: Cardiology

## 2014-08-24 DIAGNOSIS — Z955 Presence of coronary angioplasty implant and graft: Secondary | ICD-10-CM | POA: Diagnosis not present

## 2014-08-24 LAB — GLUCOSE, CAPILLARY: Glucose-Capillary: 104 mg/dL — ABNORMAL HIGH (ref 70–99)

## 2014-08-26 ENCOUNTER — Encounter (HOSPITAL_COMMUNITY)
Admission: RE | Admit: 2014-08-26 | Discharge: 2014-08-26 | Disposition: A | Discharge status: Home / Self Care | Payer: Managed Care, Other (non HMO) | Source: Ambulatory Visit | Attending: Cardiology | Admitting: Cardiology

## 2014-08-26 DIAGNOSIS — Z955 Presence of coronary angioplasty implant and graft: Secondary | ICD-10-CM | POA: Diagnosis not present

## 2014-08-26 LAB — GLUCOSE, CAPILLARY: Glucose-Capillary: 96 mg/dL (ref 70–99)

## 2014-08-29 ENCOUNTER — Encounter (HOSPITAL_COMMUNITY)
Admission: RE | Admit: 2014-08-29 | Discharge: 2014-08-29 | Disposition: A | Discharge status: Home / Self Care | Payer: Managed Care, Other (non HMO) | Source: Ambulatory Visit | Attending: Cardiology | Admitting: Cardiology

## 2014-08-29 DIAGNOSIS — Z955 Presence of coronary angioplasty implant and graft: Secondary | ICD-10-CM | POA: Diagnosis not present

## 2014-08-29 LAB — GLUCOSE, CAPILLARY: Glucose-Capillary: 100 mg/dL — ABNORMAL HIGH (ref 70–99)

## 2014-08-30 ENCOUNTER — Encounter: Payer: Self-pay | Admitting: Neurology

## 2014-08-31 ENCOUNTER — Encounter (HOSPITAL_COMMUNITY): Payer: Self-pay

## 2014-08-31 ENCOUNTER — Encounter (HOSPITAL_COMMUNITY)
Admission: RE | Admit: 2014-08-31 | Discharge: 2014-08-31 | Disposition: A | Discharge status: Home / Self Care | Payer: Managed Care, Other (non HMO) | Source: Ambulatory Visit | Attending: Cardiology | Admitting: Cardiology

## 2014-08-31 DIAGNOSIS — Z955 Presence of coronary angioplasty implant and graft: Secondary | ICD-10-CM | POA: Diagnosis not present

## 2014-08-31 LAB — GLUCOSE, CAPILLARY: Glucose-Capillary: 98 mg/dL (ref 70–99)

## 2014-09-02 ENCOUNTER — Encounter (HOSPITAL_COMMUNITY)
Admission: RE | Admit: 2014-09-02 | Discharge: 2014-09-02 | Disposition: A | Discharge status: Home / Self Care | Payer: Managed Care, Other (non HMO) | Source: Ambulatory Visit | Attending: Cardiology | Admitting: Cardiology

## 2014-09-02 DIAGNOSIS — Z955 Presence of coronary angioplasty implant and graft: Secondary | ICD-10-CM | POA: Diagnosis not present

## 2014-09-02 LAB — GLUCOSE, CAPILLARY: Glucose-Capillary: 93 mg/dL (ref 70–99)

## 2014-09-05 ENCOUNTER — Encounter (HOSPITAL_COMMUNITY): Payer: Self-pay

## 2014-09-05 ENCOUNTER — Encounter (HOSPITAL_COMMUNITY)
Admission: RE | Admit: 2014-09-05 | Discharge: 2014-09-05 | Disposition: A | Discharge status: Home / Self Care | Payer: Self-pay | Source: Ambulatory Visit | Attending: Cardiology | Admitting: Cardiology

## 2014-09-05 ENCOUNTER — Encounter (HOSPITAL_COMMUNITY): Payer: Managed Care, Other (non HMO)

## 2014-09-05 DIAGNOSIS — I252 Old myocardial infarction: Secondary | ICD-10-CM | POA: Insufficient documentation

## 2014-09-05 DIAGNOSIS — I2510 Atherosclerotic heart disease of native coronary artery without angina pectoris: Secondary | ICD-10-CM | POA: Insufficient documentation

## 2014-09-05 DIAGNOSIS — I714 Abdominal aortic aneurysm, without rupture: Secondary | ICD-10-CM | POA: Insufficient documentation

## 2014-09-05 DIAGNOSIS — Z5189 Encounter for other specified aftercare: Secondary | ICD-10-CM | POA: Insufficient documentation

## 2014-09-05 DIAGNOSIS — I429 Cardiomyopathy, unspecified: Secondary | ICD-10-CM | POA: Insufficient documentation

## 2014-09-05 DIAGNOSIS — I4891 Unspecified atrial fibrillation: Secondary | ICD-10-CM | POA: Insufficient documentation

## 2014-09-06 ENCOUNTER — Emergency Department (HOSPITAL_COMMUNITY): Payer: Managed Care, Other (non HMO)

## 2014-09-06 ENCOUNTER — Encounter (HOSPITAL_COMMUNITY): Payer: Self-pay

## 2014-09-06 ENCOUNTER — Other Ambulatory Visit: Payer: Self-pay

## 2014-09-06 ENCOUNTER — Inpatient Hospital Stay (HOSPITAL_COMMUNITY)
Admission: EM | Admit: 2014-09-06 | Discharge: 2014-09-16 | DRG: 026 | Disposition: A | Discharge status: Home / Self Care | Payer: Managed Care, Other (non HMO) | Attending: Neurosurgery | Admitting: Neurosurgery

## 2014-09-06 ENCOUNTER — Encounter (HOSPITAL_COMMUNITY): Payer: Self-pay | Admitting: *Deleted

## 2014-09-06 ENCOUNTER — Encounter (HOSPITAL_COMMUNITY)
Admission: RE | Admit: 2014-09-06 | Discharge: 2014-09-06 | Disposition: A | Discharge status: Home / Self Care | Payer: Self-pay | Source: Ambulatory Visit | Attending: Cardiology | Admitting: Cardiology

## 2014-09-06 DIAGNOSIS — S065XAA Traumatic subdural hemorrhage with loss of consciousness status unknown, initial encounter: Secondary | ICD-10-CM | POA: Diagnosis present

## 2014-09-06 DIAGNOSIS — I639 Cerebral infarction, unspecified: Secondary | ICD-10-CM

## 2014-09-06 DIAGNOSIS — R569 Unspecified convulsions: Secondary | ICD-10-CM | POA: Diagnosis not present

## 2014-09-06 DIAGNOSIS — F329 Major depressive disorder, single episode, unspecified: Secondary | ICD-10-CM | POA: Diagnosis present

## 2014-09-06 DIAGNOSIS — E039 Hypothyroidism, unspecified: Secondary | ICD-10-CM | POA: Diagnosis present

## 2014-09-06 DIAGNOSIS — I119 Hypertensive heart disease without heart failure: Secondary | ICD-10-CM | POA: Diagnosis present

## 2014-09-06 DIAGNOSIS — Z7902 Long term (current) use of antithrombotics/antiplatelets: Secondary | ICD-10-CM | POA: Diagnosis not present

## 2014-09-06 DIAGNOSIS — Z23 Encounter for immunization: Secondary | ICD-10-CM

## 2014-09-06 DIAGNOSIS — E119 Type 2 diabetes mellitus without complications: Secondary | ICD-10-CM

## 2014-09-06 DIAGNOSIS — I429 Cardiomyopathy, unspecified: Secondary | ICD-10-CM | POA: Diagnosis present

## 2014-09-06 DIAGNOSIS — W2105XA Struck by basketball, initial encounter: Secondary | ICD-10-CM

## 2014-09-06 DIAGNOSIS — Z87891 Personal history of nicotine dependence: Secondary | ICD-10-CM

## 2014-09-06 DIAGNOSIS — G4733 Obstructive sleep apnea (adult) (pediatric): Secondary | ICD-10-CM | POA: Diagnosis present

## 2014-09-06 DIAGNOSIS — I6200 Nontraumatic subdural hemorrhage, unspecified: Secondary | ICD-10-CM

## 2014-09-06 DIAGNOSIS — R4701 Aphasia: Secondary | ICD-10-CM | POA: Diagnosis present

## 2014-09-06 DIAGNOSIS — G8190 Hemiplegia, unspecified affecting unspecified side: Secondary | ICD-10-CM | POA: Diagnosis present

## 2014-09-06 DIAGNOSIS — E785 Hyperlipidemia, unspecified: Secondary | ICD-10-CM | POA: Diagnosis present

## 2014-09-06 DIAGNOSIS — G40909 Epilepsy, unspecified, not intractable, without status epilepticus: Secondary | ICD-10-CM | POA: Diagnosis present

## 2014-09-06 DIAGNOSIS — W010XXA Fall on same level from slipping, tripping and stumbling without subsequent striking against object, initial encounter: Secondary | ICD-10-CM | POA: Diagnosis present

## 2014-09-06 DIAGNOSIS — I2510 Atherosclerotic heart disease of native coronary artery without angina pectoris: Secondary | ICD-10-CM | POA: Diagnosis present

## 2014-09-06 DIAGNOSIS — R29898 Other symptoms and signs involving the musculoskeletal system: Secondary | ICD-10-CM

## 2014-09-06 DIAGNOSIS — S065X9A Traumatic subdural hemorrhage with loss of consciousness of unspecified duration, initial encounter: Secondary | ICD-10-CM | POA: Diagnosis present

## 2014-09-06 DIAGNOSIS — Z955 Presence of coronary angioplasty implant and graft: Secondary | ICD-10-CM | POA: Diagnosis not present

## 2014-09-06 DIAGNOSIS — I10 Essential (primary) hypertension: Secondary | ICD-10-CM

## 2014-09-06 DIAGNOSIS — I629 Nontraumatic intracranial hemorrhage, unspecified: Secondary | ICD-10-CM | POA: Diagnosis not present

## 2014-09-06 HISTORY — DX: Personal history of other diseases of the digestive system: Z87.19

## 2014-09-06 HISTORY — DX: Personal history of colonic polyps: Z86.010

## 2014-09-06 HISTORY — DX: Male erectile dysfunction, unspecified: N52.9

## 2014-09-06 HISTORY — DX: Personal history of colon polyps, unspecified: Z86.0100

## 2014-09-06 HISTORY — DX: Hypothyroidism, unspecified: E03.9

## 2014-09-06 HISTORY — DX: Sleep apnea, unspecified: G47.30

## 2014-09-06 LAB — RAPID URINE DRUG SCREEN, HOSP PERFORMED
Amphetamines: NOT DETECTED
BENZODIAZEPINES: NOT DETECTED
Barbiturates: NOT DETECTED
Cocaine: NOT DETECTED
OPIATES: NOT DETECTED
Tetrahydrocannabinol: NOT DETECTED

## 2014-09-06 LAB — DIFFERENTIAL
BASOS ABS: 0 10*3/uL (ref 0.0–0.1)
BASOS PCT: 0 % (ref 0–1)
Eosinophils Absolute: 0.3 10*3/uL (ref 0.0–0.7)
Eosinophils Relative: 4 % (ref 0–5)
Lymphocytes Relative: 31 % (ref 12–46)
Lymphs Abs: 2.5 10*3/uL (ref 0.7–4.0)
Monocytes Absolute: 0.9 10*3/uL (ref 0.1–1.0)
Monocytes Relative: 11 % (ref 3–12)
NEUTROS ABS: 4.4 10*3/uL (ref 1.7–7.7)
NEUTROS PCT: 54 % (ref 43–77)

## 2014-09-06 LAB — URINALYSIS, ROUTINE W REFLEX MICROSCOPIC
Bilirubin Urine: NEGATIVE
Glucose, UA: NEGATIVE mg/dL
HGB URINE DIPSTICK: NEGATIVE
KETONES UR: NEGATIVE mg/dL
Leukocytes, UA: NEGATIVE
Nitrite: NEGATIVE
PH: 5.5 (ref 5.0–8.0)
Protein, ur: NEGATIVE mg/dL
SPECIFIC GRAVITY, URINE: 1.014 (ref 1.005–1.030)
Urobilinogen, UA: 1 mg/dL (ref 0.0–1.0)

## 2014-09-06 LAB — CBC
HCT: 37.6 % — ABNORMAL LOW (ref 39.0–52.0)
Hemoglobin: 12.3 g/dL — ABNORMAL LOW (ref 13.0–17.0)
MCH: 28.9 pg (ref 26.0–34.0)
MCHC: 32.7 g/dL (ref 30.0–36.0)
MCV: 88.5 fL (ref 78.0–100.0)
Platelets: 158 10*3/uL (ref 150–400)
RBC: 4.25 MIL/uL (ref 4.22–5.81)
RDW: 15 % (ref 11.5–15.5)
WBC: 8.2 10*3/uL (ref 4.0–10.5)

## 2014-09-06 LAB — COMPREHENSIVE METABOLIC PANEL
ALBUMIN: 3.7 g/dL (ref 3.5–5.2)
ALK PHOS: 110 U/L (ref 39–117)
ALT: 15 U/L (ref 0–53)
ANION GAP: 15 (ref 5–15)
AST: 20 U/L (ref 0–37)
BUN: 18 mg/dL (ref 6–23)
CHLORIDE: 100 meq/L (ref 96–112)
CO2: 23 mEq/L (ref 19–32)
Calcium: 9.1 mg/dL (ref 8.4–10.5)
Creatinine, Ser: 0.88 mg/dL (ref 0.50–1.35)
GFR calc Af Amer: >90 mL/min (ref >90)
GFR calc non Af Amer: 86 mL/min — ABNORMAL LOW (ref >90)
Glucose, Bld: 112 mg/dL — ABNORMAL HIGH (ref 70–99)
Potassium: 4.3 mEq/L (ref 3.7–5.3)
SODIUM: 138 meq/L (ref 137–147)
TOTAL PROTEIN: 7.3 g/dL (ref 6.0–8.3)
Total Bilirubin: 0.6 mg/dL (ref 0.3–1.2)

## 2014-09-06 LAB — APTT: APTT: 36 s (ref 24–37)

## 2014-09-06 LAB — CBG MONITORING, ED: Glucose-Capillary: 120 mg/dL — ABNORMAL HIGH (ref 70–99)

## 2014-09-06 LAB — I-STAT TROPONIN, ED: Troponin i, poc: 0 ng/mL (ref 0.00–0.08)

## 2014-09-06 LAB — PROTIME-INR
INR: 1.07 (ref 0.00–1.49)
Prothrombin Time: 14 seconds (ref 11.6–15.2)

## 2014-09-06 MED ORDER — ACETAMINOPHEN 650 MG RE SUPP: 650.0000 mg | RECTAL | Status: DC | PRN

## 2014-09-06 MED ORDER — STROKE: EARLY STAGES OF RECOVERY BOOK
Freq: Once | Status: AC
  Administered 2014-09-06
  Filled 2014-09-06: qty 1

## 2014-09-06 MED ORDER — SENNOSIDES-DOCUSATE SODIUM 8.6-50 MG PO TABS
1.0000 | ORAL_TABLET | Freq: Two times a day (BID) | ORAL | Status: DC
  Administered 2014-09-06 – 2014-09-07 (×3): 1 via ORAL
  Filled 2014-09-06 (×13): qty 1

## 2014-09-06 MED ORDER — PANTOPRAZOLE SODIUM 40 MG IV SOLR
40.0000 mg | Freq: Every day | INTRAVENOUS | Status: DC
  Administered 2014-09-06 – 2014-09-08 (×3): 40 mg via INTRAVENOUS
  Filled 2014-09-06 (×4): qty 40

## 2014-09-06 MED ORDER — LABETALOL HCL 5 MG/ML IV SOLN: 10.0000 mg | INTRAVENOUS | Status: DC | PRN

## 2014-09-06 MED ORDER — SODIUM CHLORIDE 0.9 % IV SOLN
INTRAVENOUS | Status: DC
  Administered 2014-09-06 – 2014-09-07 (×2): via INTRAVENOUS
  Administered 2014-09-08: 1000 mL via INTRAVENOUS

## 2014-09-06 MED ORDER — ACETAMINOPHEN 325 MG PO TABS
650.0000 mg | ORAL_TABLET | ORAL | Status: DC | PRN
  Administered 2014-09-07 – 2014-09-12 (×7): 650 mg via ORAL
  Filled 2014-09-06 (×9): qty 2

## 2014-09-06 NOTE — ED Notes (Signed)
Pt to ED as a Code Stroke. Pt was watching a basketball game when family noticed the pt was having a hard time finding his words. Pt reports feeling like his tongue was "fat." Pt had a syncopal episode in June in which in fell and hit his head; diagnosed with a "bleeding in the brain". Pt has been taking Brelinta and ASA. Pt c/o slight headaches over the past few days and numbness in face. LSN [id number].

## 2014-09-06 NOTE — Code Documentation (Signed)
68 yo wm brought to [facility] by [facility] for sudden onset word finding difficulty & slurred speech while watching a basketball game with his grandson.  He called EMS himself & called his neighbor to come get his grandson.  Per report he has a hx of fall resulting in a head bleed & cardiac stenting for which he takes Brilinta & ASA.  He admits to having a HA x3days.  By arrival to ED his symptoms had resolved.  NIH 0.  Plan to consult neurosurgery for admit for SDH.

## 2014-09-06 NOTE — ED Notes (Signed)
Pt reports having L sided facial numbness on Friday and slight headaches over several days. Pt had a sudden onset of slurred speech and aphasia while watching a basketball game tonight. Pt reports "mind is working faster than my words come out." Pt has a past history of subdural bleed in June after a fall. Pt is also taking Brilinta and aspirin for stent. Pt also has history of tonic clonic seizures since fall, in which he takes medications. Pt's symptoms have improved since arrival. Speech is clear. No other neuro symptoms present at this time

## 2014-09-06 NOTE — Consult Note (Signed)
Reason for Consult:Left subdural hematoma Referring Physician: Perrin Gens is an 68 y.o. male.  HPI: Patient fell last week and jarred himself but did not strike his head.  He came to ER tonight after episode of speech arrest without frank seizure.  In June of this year, he struck his head and had a right frontal SDH.  Dr. Kathyrn Sheriff followed him from Neurosurgery.  He is also seen by Dr. Leonie Man.  Patient is on anti-platelet agent for cardiac stent.   Past Medical History  Diagnosis Date  . ALLERGIC RHINITIS 05/07/2007  . HYPERLIPIDEMIA 05/07/2007  . COLONIC POLYPS, HX OF 12/07/2007  . DIVERTICULITIS, HX OF 05/07/2007  . DIVERTICULOSIS, COLON 12/07/2007  . ERECTILE DYSFUNCTION 05/07/2007  . HYPERTENSION 05/16/2008    pt denies  . Cardiomyopathy   . SDH (subdural hematoma)     Following syncopal episode  . SAH (subarachnoid hemorrhage)     Following syncopal episode  . Kidney stones     "passed it"  . Chronic bronchitis     "get it pretty much q yr" (05/11/2014)  . SLEEP APNEA, OBSTRUCTIVE     "don't wear mask" (05/11/2014)  . HYPOTHYROIDISM   . DEPRESSION   . Type II diabetes mellitus   . Seizures     just once a few weeks ago" (05/11/2014)  . CAD (coronary artery disease)     Past Surgical History  Procedure Laterality Date  . Colonoscopy    . Polypectomy    . Coronary angioplasty with stent placement  05/11/2014    "1"    Family History  Problem Relation Age of Onset  . Cancer Brother     prostate cancer  . Cancer Cousin     lung cancer  . Colon cancer Neg Hx   . Rectal cancer Neg Hx   . Stomach cancer Neg Hx   . Lung cancer Father     dad was a smoker  . Heart disease Father   . Heart disease Mother     Social History:  reports that he quit smoking about 30 years ago. His smoking use included Cigarettes. He has a 30 pack-year smoking history. He has never used smokeless tobacco. He reports that he drinks about 0.6 oz of alcohol per week. He reports that he  does not use illicit drugs.  Allergies: No Known Allergies  Medications: I have reviewed the patient's current medications.  Results for orders placed or performed during the hospital encounter of 09/06/14 (from the past 48 hour(s))  I-stat troponin, ED (not at [facility])     Status: None   Collection Time: 09/06/14  8:23 PM  Result Value Ref Range   Troponin i, poc 0.00 0.00 - 0.08 ng/mL   Comment 3            Comment: Due to the release kinetics of cTnI, a negative result within the first hours of the onset of symptoms does not rule out myocardial infarction with certainty. If myocardial infarction is still suspected, repeat the test at appropriate intervals.   Protime-INR     Status: None   Collection Time: 09/06/14  8:29 PM  Result Value Ref Range   Prothrombin Time 14.0 11.6 - 15.2 seconds   INR 1.07 0.00 - 1.49  APTT     Status: None   Collection Time: 09/06/14  8:29 PM  Result Value Ref Range   aPTT 36 24 - 37 seconds  CBC     Status: Abnormal  Collection Time: 09/06/14  8:29 PM  Result Value Ref Range   WBC 8.2 4.0 - 10.5 K/uL   RBC 4.25 4.22 - 5.81 MIL/uL   Hemoglobin 12.3 (L) 13.0 - 17.0 g/dL   HCT 37.6 (L) 39.0 - 52.0 %   MCV 88.5 78.0 - 100.0 fL   MCH 28.9 26.0 - 34.0 pg   MCHC 32.7 30.0 - 36.0 g/dL   RDW 15.0 11.5 - 15.5 %   Platelets 158 150 - 400 K/uL  Differential     Status: None   Collection Time: 09/06/14  8:29 PM  Result Value Ref Range   Neutrophils Relative % 54 43 - 77 %   Neutro Abs 4.4 1.7 - 7.7 K/uL   Lymphocytes Relative 31 12 - 46 %   Lymphs Abs 2.5 0.7 - 4.0 K/uL   Monocytes Relative 11 3 - 12 %   Monocytes Absolute 0.9 0.1 - 1.0 K/uL   Eosinophils Relative 4 0 - 5 %   Eosinophils Absolute 0.3 0.0 - 0.7 K/uL   Basophils Relative 0 0 - 1 %   Basophils Absolute 0.0 0.0 - 0.1 K/uL  Comprehensive metabolic panel     Status: Abnormal   Collection Time: 09/06/14  8:29 PM  Result Value Ref Range   Sodium 138 137 - 147 mEq/L   Potassium 4.3 3.7  - 5.3 mEq/L   Chloride 100 96 - 112 mEq/L   CO2 23 19 - 32 mEq/L   Glucose, Bld 112 (H) 70 - 99 mg/dL   BUN 18 6 - 23 mg/dL   Creatinine, Ser 0.88 0.50 - 1.35 mg/dL   Calcium 9.1 8.4 - 10.5 mg/dL   Total Protein 7.3 6.0 - 8.3 g/dL   Albumin 3.7 3.5 - 5.2 g/dL   AST 20 0 - 37 U/L   ALT 15 0 - 53 U/L   Alkaline Phosphatase 110 39 - 117 U/L   Total Bilirubin 0.6 0.3 - 1.2 mg/dL   GFR calc non Af Amer 86 (L) >90 mL/min   GFR calc Af Amer >90 >90 mL/min    Comment: (NOTE) The eGFR has been calculated using the CKD EPI equation. This calculation has not been validated in all clinical situations. eGFR's persistently <90 mL/min signify possible Chronic Kidney Disease.    Anion gap 15 5 - 15  CBG monitoring, ED     Status: Abnormal   Collection Time: 09/06/14  8:39 PM  Result Value Ref Range   Glucose-Capillary 120 (H) 70 - 99 mg/dL    Ct Head Wo Contrast  09/06/2014   CLINICAL DATA:  Slurred speech.  Seizure activity.  EXAM: CT HEAD WITHOUT CONTRAST  TECHNIQUE: Contiguous axial images were obtained from the base of the skull through the vertex without intravenous contrast.  COMPARISON:  04/28/2014  FINDINGS: There is an acute subdural hematoma overlying the left frontal, parietal, and anterior left temporal lobes. This has a maximum thickness of 1 cm, image 21/ series 201. Smaller right frontal subdural hematoma measures 4 mm. No evidence for acute brain infarct. Right anterior parafalcine subdural hematoma is small measuring 7 mm. Right sided parafalcine subdural hygroma is identified. This is new from the previous exam measuring 3 mm. Patchy low attenuation within the subcortical and periventricular white matter is identified compatible with chronic microvascular disease. The paranasal sinuses are clear. The mastoid air cells are clear the calvarium is intact.  IMPRESSION: 1. Bilateral and parafalcine acute subdural hematomas are identified. 2. Small vessel ischemic  change and brain atrophy.  3. Critical Value/emergent results were called by telephone at the time of interpretation on 09/06/2014 at 8:42 pm to Dr. Nicole Kindred, who verbally acknowledged these results.   Electronically Signed   By: Kerby Moors M.D.   On: 09/06/2014 20:43    Review of Systems - Negative except as above    Blood pressure 121/58, pulse 62, temperature 97.8 F (36.6 C), temperature source Oral, resp. rate 16, SpO2 99 %. Physical Exam  Awake, alert, conversant.  Fluent speech.  Face symmetric.  PERRL, EOMI.  MAEW without drift.  No weakness or numbness.  Reflexes symmetric.  Assessment/Plan: Left SDH.  Episode of speech arrest.  Possible seizure.  Hold anti-platelet agents.  Observe in ICU.  Repeat Head CT in am.  No need for surgery at present.  Peggyann Shoals, MD 09/06/2014, 10:19 PM

## 2014-09-06 NOTE — ED Notes (Signed)
Neuro surgery at bedside.

## 2014-09-06 NOTE — ED Notes (Signed)
Dr.Lockwood at bedside  

## 2014-09-06 NOTE — ED Notes (Signed)
Dr.Stewart at bedside speaking to family and pt

## 2014-09-06 NOTE — ED Notes (Signed)
Pt aware of urine sample needed, pt doesn't have to go at this time.

## 2014-09-06 NOTE — ED Provider Notes (Signed)
CSN: [id number]     Arrival date & time 09/06/14  2017 History   First MD Initiated Contact with Patient 09/06/14 2021     Chief Complaint  Patient presents with  . Code Stroke    An emergency department physician performed an initial assessment on this suspected stroke patient at 2021.  HPI  Patient presents after the acute onset of speech difficulty, both production and of thinking. Symptoms began approximately 40 minutes prior to our evaluation. Since onset symptoms have improved minimally. Patient states that he was generally well prior to the onset of symptoms. He denies pain, confusion, disorientation, weakness in any extremity. He has a history of recent coronary stent, and takes anticoagulants appropriately.    Past Medical History  Diagnosis Date  . ALLERGIC RHINITIS 05/07/2007  . HYPERLIPIDEMIA 05/07/2007  . COLONIC POLYPS, HX OF 12/07/2007  . DIVERTICULITIS, HX OF 05/07/2007  . DIVERTICULOSIS, COLON 12/07/2007  . ERECTILE DYSFUNCTION 05/07/2007  . HYPERTENSION 05/16/2008    pt denies  . Cardiomyopathy   . SDH (subdural hematoma)     Following syncopal episode  . SAH (subarachnoid hemorrhage)     Following syncopal episode  . Kidney stones     "passed it"  . Chronic bronchitis     "get it pretty much q yr" (05/11/2014)  . SLEEP APNEA, OBSTRUCTIVE     "don't wear mask" (05/11/2014)  . HYPOTHYROIDISM   . DEPRESSION   . Type II diabetes mellitus   . Seizures     just once a few weeks ago" (05/11/2014)  . CAD (coronary artery disease)    Past Surgical History  Procedure Laterality Date  . Colonoscopy    . Polypectomy    . Coronary angioplasty with stent placement  05/11/2014    "1"   Family History  Problem Relation Age of Onset  . Cancer Brother     prostate cancer  . Cancer Cousin     lung cancer  . Colon cancer Neg Hx   . Rectal cancer Neg Hx   . Stomach cancer Neg Hx   . Lung cancer Father     dad was a smoker  . Heart disease Father   . Heart disease  Mother    History  Substance Use Topics  . Smoking status: Former Smoker -- 1.00 packs/day for 30 years    Types: Cigarettes    Quit date: 10/15/1983  . Smokeless tobacco: Never Used  . Alcohol Use: 0.6 oz/week    1 Shots of liquor per week    Review of Systems  Constitutional:       Per HPI, otherwise negative  HENT:       Per HPI, otherwise negative  Respiratory:       Per HPI, otherwise negative  Cardiovascular:       Per HPI, otherwise negative  Gastrointestinal: Negative for vomiting.  Endocrine:       Negative aside from HPI  Genitourinary:       Neg aside from HPI   Musculoskeletal:       Per HPI, otherwise negative  Skin: Negative.   Neurological: Positive for speech difficulty. Negative for syncope.      Allergies  Review of patient's allergies indicates no known allergies.  Home Medications   Prior to Admission medications   Medication Sig Start Date End Date Taking? Authorizing Provider  acyclovir (ZOVIRAX) 200 MG capsule Take 1 capsule (200 mg total) by mouth 5 (five) times daily. For 5 days 07/14/14  Biagio Borg, MD  aspirin EC 81 MG tablet Take 1 tablet (81 mg total) by mouth daily. 04/28/14   Lelon Perla, MD  atorvastatin (LIPITOR) 80 MG tablet Take 1 tablet (80 mg total) by mouth daily. 06/28/14   Lelon Perla, MD  carvedilol (COREG) 3.125 MG tablet Take 1 tablet (3.125 mg total) by mouth 2 (two) times daily with a meal. 07/26/14   Biagio Borg, MD  glucose blood (ONE TOUCH TEST STRIPS) test strip Use as directed twice daily to check blood sugar.  Diagnosis code 250.00 04/21/14   Biagio Borg, MD  levETIRAcetam (KEPPRA) 500 MG tablet Take 1 tablet (500 mg total) by mouth 2 (two) times daily. 06/01/14   Biagio Borg, MD  levothyroxine (SYNTHROID, LEVOTHROID) 100 MCG tablet Take 1 tablet (100 mcg total) by mouth daily before breakfast. 06/01/14   Biagio Borg, MD  lisinopril (PRINIVIL,ZESTRIL) 2.5 MG tablet Take 1 tablet (2.5 mg total) by mouth daily.  07/26/14   Biagio Borg, MD  metFORMIN (GLUCOPHAGE) 1000 MG tablet Take 1 tablet (1,000 mg total) by mouth 2 (two) times daily with a meal. 06/01/14   Biagio Borg, MD  nitroGLYCERIN (NITROSTAT) 0.4 MG SL tablet Place 1 tablet (0.4 mg total) under the tongue every 5 (five) minutes as needed for chest pain. 05/12/14   Brett Canales, PA-C  pioglitazone (ACTOS) 45 MG tablet Take 1 tablet (45 mg total) by mouth daily. 06/01/14   Biagio Borg, MD  ticagrelor (BRILINTA) 90 MG TABS tablet Take 1 tablet (90 mg total) by mouth 2 (two) times daily. 06/07/14   Lelon Perla, MD   SpO2 99% Physical Exam  Constitutional: He is oriented to person, place, and time. He appears well-developed. No distress.  HENT:  Head: Normocephalic and atraumatic.  Eyes: Conjunctivae and EOM are normal.  Cardiovascular: Normal rate and regular rhythm.   Pulmonary/Chest: Effort normal. No stridor. No respiratory distress.  Abdominal: He exhibits no distension.  Musculoskeletal: He exhibits no edema.  Neurological: He is alert and oriented to person, place, and time. He displays no atrophy and no tremor. A cranial nerve deficit is present. He exhibits normal muscle tone. He displays no seizure activity.  Speech slightly slurred, no facial asymmetry Patient has minimal pronator drift deficiency on the right. Patient is awake, alert, oriented 3.   Skin: Skin is warm and dry.  Psychiatric: He has a normal mood and affect.  Nursing note and vitals reviewed.   ED Course  Procedures (including critical care time) Labs Review Labs Reviewed  CBC - Abnormal; Notable for the following:    Hemoglobin 12.3 (*)    HCT 37.6 (*)    All other components within normal limits  COMPREHENSIVE METABOLIC PANEL - Abnormal; Notable for the following:    Glucose, Bld 112 (*)    GFR calc non Af Amer 86 (*)    All other components within normal limits  GLUCOSE, CAPILLARY - Abnormal; Notable for the following:    Glucose-Capillary 108 (*)     All other components within normal limits  CBG MONITORING, ED - Abnormal; Notable for the following:    Glucose-Capillary 120 (*)    All other components within normal limits  MRSA PCR SCREENING  PROTIME-INR  APTT  DIFFERENTIAL  URINE RAPID DRUG SCREEN (HOSP PERFORMED)  URINALYSIS, ROUTINE W REFLEX MICROSCOPIC  I-STAT TROPOININ, ED    Imaging Review Ct Head Wo Contrast  09/06/2014   CLINICAL  DATA:  Slurred speech.  Seizure activity.  EXAM: CT HEAD WITHOUT CONTRAST  TECHNIQUE: Contiguous axial images were obtained from the base of the skull through the vertex without intravenous contrast.  COMPARISON:  04/28/2014  FINDINGS: There is an acute subdural hematoma overlying the left frontal, parietal, and anterior left temporal lobes. This has a maximum thickness of 1 cm, image 21/ series 201. Smaller right frontal subdural hematoma measures 4 mm. No evidence for acute brain infarct. Right anterior parafalcine subdural hematoma is small measuring 7 mm. Right sided parafalcine subdural hygroma is identified. This is new from the previous exam measuring 3 mm. Patchy low attenuation within the subcortical and periventricular white matter is identified compatible with chronic microvascular disease. The paranasal sinuses are clear. The mastoid air cells are clear the calvarium is intact.  IMPRESSION: 1. Bilateral and parafalcine acute subdural hematomas are identified. 2. Small vessel ischemic change and brain atrophy. 3. Critical Value/emergent results were called by telephone at the time of interpretation on 09/06/2014 at 8:42 pm to Dr. Nicole Kindred, who verbally acknowledged these results.   Electronically Signed   By: Kerby Moors M.D.   On: 09/06/2014 20:43   I reviewed the CT with our neurologist, new SDH, left lateral    ECG: NS, 72 nml   Update: Patient's speech difficulty has diminished. I discussed patient's case with our neurosurgical team. Given that the patient is on antiplatelet agent,  and has intracranial hemorrhage, he will be admitted to the ICU.   MDM   Final diagnoses:  Stroke    Patient presents with the acute onset loss of speech, new facial droop.  Though the patient's speech improves over the course of his emergency department course, the patient's intracranial hemorrhage remains a critical concern. Patient's labs were largely reassuring, vitals reassuring, but given the intracranial bleed, he required admission to the ICU for further evaluation and management.  CRITICAL CARE Performed by: Carmin Muskrat Total critical care time: 35 Critical care time was exclusive of separately billable procedures and treating other patients. Critical care was necessary to treat or prevent imminent or life-threatening deterioration. Critical care was time spent personally by me on the following activities: development of treatment plan with patient and/or surrogate as well as nursing, discussions with consultants, evaluation of patient's response to treatment, examination of patient, obtaining history from patient or surrogate, ordering and performing treatments and interventions, ordering and review of laboratory studies, ordering and review of radiographic studies, pulse oximetry and re-evaluation of patient's condition.     Carmin Muskrat, MD 09/07/14 [id number]

## 2014-09-06 NOTE — H&P (Signed)
Admission H&P    Chief Complaint: Mild headache and transient speech output difficulty.  HPI: Allen Myers is an 68 y.o. male history of hypertension, diabetes mellitus, subarachnoid hemorrhage and subdural hematoma June 2015, coronary artery disease with stent placement and seizure disorder, who was brought to the emergency room following onset of speech output difficulty as well as complaint of headache which started at 7:05 PM. There was no confusion and no loss of consciousness. No focal seizure activity noted. He's been experiencing mild headache intermittently over the past several days. He had an episode of transient numbness involving his right cheek and right side of his mouth 5 days ago. CT scan of his head showed bilateral hand parafalcine acute subdural hematomas. No midline shift is noted. Patient has been taking aspirin and Brilinta for antiplatelet therapy. Speech deficit and headache resolved.  Past Medical History  Diagnosis Date  . ALLERGIC RHINITIS 05/07/2007  . HYPERLIPIDEMIA 05/07/2007  . COLONIC POLYPS, HX OF 12/07/2007  . DIVERTICULITIS, HX OF 05/07/2007  . DIVERTICULOSIS, COLON 12/07/2007  . ERECTILE DYSFUNCTION 05/07/2007  . HYPERTENSION 05/16/2008    pt denies  . Cardiomyopathy   . SDH (subdural hematoma)     Following syncopal episode  . SAH (subarachnoid hemorrhage)     Following syncopal episode  . Kidney stones     "passed it"  . Chronic bronchitis     "get it pretty much q yr" (05/11/2014)  . SLEEP APNEA, OBSTRUCTIVE     "don't wear mask" (05/11/2014)  . HYPOTHYROIDISM   . DEPRESSION   . Type II diabetes mellitus   . Seizures     just once a few weeks ago" (05/11/2014)  . CAD (coronary artery disease)     Past Surgical History  Procedure Laterality Date  . Colonoscopy    . Polypectomy    . Coronary angioplasty with stent placement  05/11/2014    "1"    Family History  Problem Relation Age of Onset  . Cancer Brother     prostate cancer  . Cancer  Cousin     lung cancer  . Colon cancer Neg Hx   . Rectal cancer Neg Hx   . Stomach cancer Neg Hx   . Lung cancer Father     dad was a smoker  . Heart disease Father   . Heart disease Mother    Social History:  reports that he quit smoking about 30 years ago. His smoking use included Cigarettes. He has a 30 pack-year smoking history. He has never used smokeless tobacco. He reports that he drinks about 0.6 oz of alcohol per week. He reports that he does not use illicit drugs.  Allergies: No Known Allergies   (Not in a hospital admission)  ROS: History obtained from spouse and the patient  General ROS: negative for - chills, fatigue, fever, night sweats, weight gain or weight loss Psychological ROS: negative for - behavioral disorder, hallucinations, memory difficulties, mood swings or suicidal ideation Ophthalmic ROS: negative for - blurry vision, double vision, eye pain or loss of vision ENT ROS: negative for - epistaxis, nasal discharge, oral lesions, sore throat, tinnitus or vertigo Allergy and Immunology ROS: negative for - hives or itchy/watery eyes Hematological and Lymphatic ROS: negative for - bleeding problems, bruising or swollen lymph nodes Endocrine ROS: negative for - galactorrhea, hair pattern changes, polydipsia/polyuria or temperature intolerance Respiratory ROS: negative for - cough, hemoptysis, shortness of breath or wheezing Cardiovascular ROS: negative for - chest pain, dyspnea  on exertion, edema or irregular heartbeat Gastrointestinal ROS: negative for - abdominal pain, diarrhea, hematemesis, nausea/vomiting or stool incontinence Genito-Urinary ROS: negative for - dysuria, hematuria, incontinence or urinary frequency/urgency Musculoskeletal ROS: negative for - joint swelling or muscular weakness Neurological ROS: as noted in HPI Dermatological ROS: negative for rash and skin lesion changes  Physical Examination: Blood pressure 121/58, pulse 62, temperature 97.8  F (36.6 C), temperature source Oral, resp. rate 16, SpO2 99 %.  HEENT-  Normocephalic, no lesions, without obvious abnormality.  Normal external eye and conjunctiva.  Normal TM's bilaterally.  Normal auditory canals and external ears. Normal external nose, mucus membranes and septum.  Normal pharynx. Neck supple with no masses, nodes, nodules or enlargement. Cardiovascular - regular rate and rhythm, S1, S2 normal, no murmur, click, rub or gallop Lungs - chest clear, no wheezing, rales, normal symmetric air entry Abdomen - soft, non-tender; bowel sounds normal; no masses,  no organomegaly Extremities - no joint deformities, effusion, or inflammation, no edema and no skin discoloration  Neurologic Examination: Mental Status: Alert, oriented, thought content appropriate.  Speech fluent without evidence of aphasia. Able to follow commands without difficulty. Cranial Nerves: II-Visual fields were normal. III/IV/VI-Pupils were equal and reacted. Extraocular movements were full and conjugate.    V/VII-no facial numbness and no facial weakness. VIII-normal. X-normal speech and symmetrical palatal movement. Motor: 5/5 bilaterally with normal tone and bulk Sensory: Normal throughout. Deep Tendon Reflexes: 2+ and symmetric. Plantars: Flexor bilaterally Cerebellar: Normal finger-to-nose testing. Carotid auscultation: Normal  Results for orders placed or performed during the hospital encounter of 09/06/14 (from the past 48 hour(s))  I-stat troponin, ED (not at [facility])     Status: None   Collection Time: 09/06/14  8:23 PM  Result Value Ref Range   Troponin i, poc 0.00 0.00 - 0.08 ng/mL   Comment 3            Comment: Due to the release kinetics of cTnI, a negative result within the first hours of the onset of symptoms does not rule out myocardial infarction with certainty. If myocardial infarction is still suspected, repeat the test at appropriate intervals.   Protime-INR     Status: None    Collection Time: 09/06/14  8:29 PM  Result Value Ref Range   Prothrombin Time 14.0 11.6 - 15.2 seconds   INR 1.07 0.00 - 1.49  APTT     Status: None   Collection Time: 09/06/14  8:29 PM  Result Value Ref Range   aPTT 36 24 - 37 seconds  CBC     Status: Abnormal   Collection Time: 09/06/14  8:29 PM  Result Value Ref Range   WBC 8.2 4.0 - 10.5 K/uL   RBC 4.25 4.22 - 5.81 MIL/uL   Hemoglobin 12.3 (L) 13.0 - 17.0 g/dL   HCT 37.6 (L) 39.0 - 52.0 %   MCV 88.5 78.0 - 100.0 fL   MCH 28.9 26.0 - 34.0 pg   MCHC 32.7 30.0 - 36.0 g/dL   RDW 15.0 11.5 - 15.5 %   Platelets 158 150 - 400 K/uL  Differential     Status: None   Collection Time: 09/06/14  8:29 PM  Result Value Ref Range   Neutrophils Relative % 54 43 - 77 %   Neutro Abs 4.4 1.7 - 7.7 K/uL   Lymphocytes Relative 31 12 - 46 %   Lymphs Abs 2.5 0.7 - 4.0 K/uL   Monocytes Relative 11 3 - 12 %  Monocytes Absolute 0.9 0.1 - 1.0 K/uL   Eosinophils Relative 4 0 - 5 %   Eosinophils Absolute 0.3 0.0 - 0.7 K/uL   Basophils Relative 0 0 - 1 %   Basophils Absolute 0.0 0.0 - 0.1 K/uL  Comprehensive metabolic panel     Status: Abnormal   Collection Time: 09/06/14  8:29 PM  Result Value Ref Range   Sodium 138 137 - 147 mEq/L   Potassium 4.3 3.7 - 5.3 mEq/L   Chloride 100 96 - 112 mEq/L   CO2 23 19 - 32 mEq/L   Glucose, Bld 112 (H) 70 - 99 mg/dL   BUN 18 6 - 23 mg/dL   Creatinine, Ser 0.88 0.50 - 1.35 mg/dL   Calcium 9.1 8.4 - 10.5 mg/dL   Total Protein 7.3 6.0 - 8.3 g/dL   Albumin 3.7 3.5 - 5.2 g/dL   AST 20 0 - 37 U/L   ALT 15 0 - 53 U/L   Alkaline Phosphatase 110 39 - 117 U/L   Total Bilirubin 0.6 0.3 - 1.2 mg/dL   GFR calc non Af Amer 86 (L) >90 mL/min   GFR calc Af Amer >90 >90 mL/min    Comment: (NOTE) The eGFR has been calculated using the CKD EPI equation. This calculation has not been validated in all clinical situations. eGFR's persistently <90 mL/min signify possible Chronic Kidney Disease.    Anion gap 15 5 - 15   CBG monitoring, ED     Status: Abnormal   Collection Time: 09/06/14  8:39 PM  Result Value Ref Range   Glucose-Capillary 120 (H) 70 - 99 mg/dL   Ct Head Wo Contrast  09/06/2014   CLINICAL DATA:  Slurred speech.  Seizure activity.  EXAM: CT HEAD WITHOUT CONTRAST  TECHNIQUE: Contiguous axial images were obtained from the base of the skull through the vertex without intravenous contrast.  COMPARISON:  04/28/2014  FINDINGS: There is an acute subdural hematoma overlying the left frontal, parietal, and anterior left temporal lobes. This has a maximum thickness of 1 cm, image 21/ series 201. Smaller right frontal subdural hematoma measures 4 mm. No evidence for acute brain infarct. Right anterior parafalcine subdural hematoma is small measuring 7 mm. Right sided parafalcine subdural hygroma is identified. This is new from the previous exam measuring 3 mm. Patchy low attenuation within the subcortical and periventricular white matter is identified compatible with chronic microvascular disease. The paranasal sinuses are clear. The mastoid air cells are clear the calvarium is intact.  IMPRESSION: 1. Bilateral and parafalcine acute subdural hematomas are identified. 2. Small vessel ischemic change and brain atrophy. 3. Critical Value/emergent results were called by telephone at the time of interpretation on 09/06/2014 at 8:42 pm to Dr. Nicole Kindred, who verbally acknowledged these results.   Electronically Signed   By: Kerby Moors M.D.   On: 09/06/2014 20:43    Assessment/Plan 68 year old with a history of traumatic subarachnoid hemorrhage and subdural hematoma, as well as coronary artery disease with stent placement, presenting with recurrent nontraumatic bilateral subarachnoid hemorrhage.  Plan: 1. Discontinue aspirin and Brilinta, for now. 2. Will admit to intensive care unit for close monitoring given the acuteness of patient's symptoms unlikely subdural hemorrhaging. 3. Repeat CT scan of the head in the  a.m. 4. Will formally consult neurosurgery if patient shows progression of subdural hemorrhage 5. Cardiology consultation for recommendations for post stent placement management  C.R. Nicole Kindred, MD [facility] [phone]  09/06/2014, 10:12 PM

## 2014-09-06 NOTE — Progress Notes (Addendum)
Pt graduated from cardiac rehab program today with completion of 36 exercise sessions in Phase II. Pt maintained good attendance and progressed nicely during his participation in rehab as evidenced by increased MET level.   Medication list reconciled. Repeat  PHQ2 score- 0 .  Pt has made significant lifestyle changes and should be commended for his success. Pt feels he has achieved his goals during cardiac rehab.   Pt plans to continue exercise in cardiac maintenance program and [employer].

## 2014-09-06 NOTE — Progress Notes (Signed)
[place] Progress Note Patient Name: [name] DOB: Jun 02, 1946 MRN: [id number]   Date of Service  09/06/2014  HPI/Events of Note  63 M with seizure disorder, DM, CAD, HTN and past h/o of SAH/SDH in June 2015 admitted with bilateral SDH without shift.  At this point not felt to be a surgical candidate by NS attending.  Patient is alert, responsive via camera check.  He is HD stable with adequate O2 sats on RA.  eICU Interventions  Plan: Continue plan of care per primary team. Have added glucose checks q4 hours. May require SSI Continue to monitor via [facility]     Intervention Category Evaluation Type: New Patient Evaluation  DETERDING,ELIZABETH 09/06/2014, 11:54 PM

## 2014-09-07 ENCOUNTER — Inpatient Hospital Stay (HOSPITAL_COMMUNITY): Payer: Managed Care, Other (non HMO)

## 2014-09-07 ENCOUNTER — Encounter (HOSPITAL_COMMUNITY): Payer: Managed Care, Other (non HMO)

## 2014-09-07 LAB — GLUCOSE, CAPILLARY
GLUCOSE-CAPILLARY: 108 mg/dL — AB (ref 70–99)
GLUCOSE-CAPILLARY: 108 mg/dL — AB (ref 70–99)
GLUCOSE-CAPILLARY: 118 mg/dL — AB (ref 70–99)
GLUCOSE-CAPILLARY: 136 mg/dL — AB (ref 70–99)
Glucose-Capillary: 115 mg/dL — ABNORMAL HIGH (ref 70–99)
Glucose-Capillary: 117 mg/dL — ABNORMAL HIGH (ref 70–99)
Glucose-Capillary: 132 mg/dL — ABNORMAL HIGH (ref 70–99)

## 2014-09-07 LAB — MRSA PCR SCREENING: MRSA BY PCR: NEGATIVE

## 2014-09-07 MED ORDER — SODIUM CHLORIDE 0.9 % IV SOLN: INTRAVENOUS | Status: DC

## 2014-09-07 NOTE — Progress Notes (Signed)
NEURO HOSPITALIST PROGRESS NOTE   SUBJECTIVE:                                                                                                                        Offers no neurological complains. No further episodes of confusion with trouble speaking. Stated that he few days ago he had a " pretty bad fall" but did not hit his head. CT brain with small bilateral and parafalcine acute subdural hematomas. Aspirin and brilinta on hold.    OBJECTIVE:                                                                                                                           Vital signs in last 24 hours: Temp:  [97.3 F (36.3 C)-97.8 F (36.6 C)] 97.4 F (36.3 C) (11/25 0805) Pulse Rate:  [55-64] 60 (11/25 0800) Resp:  [10-18] 18 (11/25 0800) BP: (97-126)/(49-72) 122/62 mmHg (11/25 0800) SpO2:  [95 %-100 %] 99 % (11/25 0800) Weight:  [102.3 kg (225 lb 8.5 oz)] 102.3 kg (225 lb 8.5 oz) (11/24 2310)  Intake/Output from previous day: 11/24 0701 - 11/25 0700 In: 553.8 [I.V.:553.8] Out: 600 [Urine:600] Intake/Output this shift: Total I/O In: 75 [I.V.:75] Out: -  Nutritional status:    Past Medical History  Diagnosis Date  . ALLERGIC RHINITIS 05/07/2007  . HYPERLIPIDEMIA 05/07/2007  . COLONIC POLYPS, HX OF 12/07/2007  . DIVERTICULITIS, HX OF 05/07/2007  . DIVERTICULOSIS, COLON 12/07/2007  . ERECTILE DYSFUNCTION 05/07/2007  . HYPERTENSION 05/16/2008    pt denies  . Cardiomyopathy   . SDH (subdural hematoma)     Following syncopal episode  . SAH (subarachnoid hemorrhage)     Following syncopal episode  . Kidney stones     "passed it"  . Chronic bronchitis     "get it pretty much q yr" (05/11/2014)  . SLEEP APNEA, OBSTRUCTIVE     "don't wear mask" (05/11/2014)  . HYPOTHYROIDISM   . DEPRESSION   . Type II diabetes mellitus   . Seizures     just once a few weeks ago" (05/11/2014)  . CAD (coronary artery disease)     Physical exam: pleasant male  in no apparent distress. Head: normocephalic. Neck: supple, no bruits, no  JVD. Cardiac: no murmurs. Lungs: clear. Abdomen: soft, no tender, no mass. Extremities: no edema.   Neurologic Exam:  Mental Status: Alert, oriented, thought content appropriate. Speech fluent without evidence of aphasia. Able to follow commands without difficulty. Cranial Nerves: II-Visual fields were normal. III/IV/VI-Pupils were equal and reacted. Extraocular movements were full and conjugate.   V/VII-no facial numbness and no facial weakness. VIII-normal. X-normal speech and symmetrical palatal movement. Motor: 5/5 bilaterally with normal tone and bulk Sensory: Normal throughout. Deep Tendon Reflexes: 2+ and symmetric. Plantars: Flexor bilaterally Cerebellar: Normal finger-to-nose testing.  Lab Results: Lab Results  Component Value Date/Time   CHOL 77 07/13/2014 07:36 AM   Lipid Panel No results for input(s): CHOL, TRIG, HDL, CHOLHDL, VLDL, LDLCALC in the last 72 hours.  Studies/Results: Ct Head Wo Contrast  09/06/2014   CLINICAL DATA:  Slurred speech.  Seizure activity.  EXAM: CT HEAD WITHOUT CONTRAST  TECHNIQUE: Contiguous axial images were obtained from the base of the skull through the vertex without intravenous contrast.  COMPARISON:  04/28/2014  FINDINGS: There is an acute subdural hematoma overlying the left frontal, parietal, and anterior left temporal lobes. This has a maximum thickness of 1 cm, image 21/ series 201. Smaller right frontal subdural hematoma measures 4 mm. No evidence for acute brain infarct. Right anterior parafalcine subdural hematoma is small measuring 7 mm. Right sided parafalcine subdural hygroma is identified. This is new from the previous exam measuring 3 mm. Patchy low attenuation within the subcortical and periventricular white matter is identified compatible with chronic microvascular disease. The paranasal sinuses are clear. The mastoid air cells are clear the  calvarium is intact.  IMPRESSION: 1. Bilateral and parafalcine acute subdural hematomas are identified. 2. Small vessel ischemic change and brain atrophy. 3. Critical Value/emergent results were called by telephone at the time of interpretation on 09/06/2014 at 8:42 pm to Dr. Nicole Kindred, who verbally acknowledged these results.   Electronically Signed   By: Kerby Moors M.D.   On: 09/06/2014 20:43    MEDICATIONS                                                                                                                        Scheduled: . pantoprazole (PROTONIX) IV  40 mg Intravenous QHS  . senna-docusate  1 tablet Oral BID    ASSESSMENT/PLAN:                                                                                                           68 y/o admitted with new onset  transient confusion associated with language impairment for approximately 30 minutes. CT brain with new bilateral and parafalcine small SDH. Concern for possible seizure. Will get EEG. Will follow up.   Dorian Pod, MD [facility] [phone]  09/07/2014, 8:54 AM

## 2014-09-07 NOTE — Procedures (Signed)
ELECTROENCEPHALOGRAM REPORT   Patient: Allen Myers       Room #: 0G86 EEG No. ID: [id number] Age: 68 y.o.        Sex: male Referring Physician: Nicole Kindred Report Date:  09/07/2014        Interpreting Physician: Alexis Goodell D  History: Allen Myers is an 68 y.o. male s/p an episode of headache and difficulty with speech  Medications:  Scheduled: . pantoprazole (PROTONIX) IV  40 mg Intravenous QHS  . senna-docusate  1 tablet Oral BID    Conditions of Recording:  This is a 16 channel EEG carried out with the patient in the awake, drowsy and asleep states.  Description:  The waking background activity consists of a low voltage, symmetrical, fairly well organized, 9 Hz alpha activity, seen from the parieto-occipital and posterior temporal regions.  Low voltage fast activity, poorly organized, is seen anteriorly and is at times superimposed on more posterior regions.  A mixture of theta and alpha rhythms are seen from the central and temporal regions. The patient drowses with slowing to irregular, low voltage theta and beta activity.   The patient goes in to a light sleep with symmetrical sleep spindles, vertex central sharp transients and irregular slow activity.   No epileptiform activity is noted.   Hyperventilation was not performed. Intermittent photic stimulation was performed but failed to illicit any change in the tracing.    IMPRESSION: Normal electroencephalogram, awake, asleep and with activation procedures. There are no focal lateralizing or epileptiform features.   Alexis Goodell, MD [facility] [phone] 09/07/2014, 2:45 PM

## 2014-09-07 NOTE — Evaluation (Signed)
Physical Therapy Evaluation and Discharge Patient Details Name: Allen Myers MRN: [id number] DOB: 02-21-1946 Today's Date: 09/07/2014   History of Present Illness  68 y/o admitted with new onset transient confusion associated with language impairment for approximately 30 minutes. CT brain with new bilateral and parafalcine small SDH.   Clinical Impression  Patient evaluated by Physical Therapy with no further acute PT needs identified. All education has been completed and the patient has no further questions. Pt able to complete challenging dynamic balance tasks without physical assist, ambulating up to 520 feet. States he feels at his baseline with functional mobility. See below for any follow-up Physial Therapy or equipment needs. PT is signing off. Thank you for this referral.     Follow Up Recommendations No PT follow up    Equipment Recommendations  None recommended by PT    Recommendations for Other Services       Precautions / Restrictions Precautions Precautions: None Restrictions Weight Bearing Restrictions: No      Mobility  Bed Mobility Overal bed mobility: Modified Independent                Transfers Overall transfer level: Modified independent                  Ambulation/Gait Ambulation/Gait assistance: Supervision Ambulation Distance (Feet): 520 Feet Assistive device: None Gait Pattern/deviations: Step-through pattern;Drifts right/left   Gait velocity interpretation: at or above normal speed for age/gender General Gait Details: Performed dynamic balance training with pt while ambulating including head turns, high marching, variable speeds, quick turns, and backwards stepping. Pt with only one instance of instability during head turns towards his right side but was able to self correct. Denies any dizziness.  Stairs            Wheelchair Mobility    Modified Rankin (Stroke Patients Only) Modified Rankin (Stroke Patients  Only) Pre-Morbid Rankin Score: No significant disability Modified Rankin: No symptoms     Balance Overall balance assessment: History of Falls;Independent                                           Pertinent Vitals/Pain Pain Assessment: No/denies pain    Home Living Family/patient expects to be discharged to:: Private residence Living Arrangements: Spouse/significant other Available Help at Discharge: Family Type of Home: House Home Access: Level entry       Home Equipment: None      Prior Function Level of Independence: Independent         Comments: works at a desk job, sells [employer]   Dominant Hand: Right    Extremity/Trunk Assessment   Upper Extremity Assessment: Defer to OT evaluation           Lower Extremity Assessment: Overall WFL for tasks assessed         Communication   Communication: No difficulties  Cognition Arousal/Alertness: Awake/alert Behavior During Therapy: WFL for tasks assessed/performed Overall Cognitive Status: Within Functional Limits for tasks assessed                      General Comments General comments (skin integrity, edema, etc.): Pt reports his fall at home was due to not wearing shoes, and slipped on a hardwood floor.  Pt was able to perform cognitive task while ambulating without difficulty.    Exercises  Assessment/Plan    PT Assessment Patent does not need any further PT services  PT Diagnosis Abnormality of gait;Altered mental status (AMS resolved)   PT Problem List    PT Treatment Interventions     PT Goals (Current goals can be found in the Care Plan section) Acute Rehab PT Goals Patient Stated Goal: Go home PT Goal Formulation: All assessment and education complete, DC therapy    Frequency     Barriers to discharge        Co-evaluation               End of Session Equipment Utilized During Treatment: Gait belt Activity Tolerance: Patient  tolerated treatment well Patient left: in chair;with call bell/phone within reach;with chair alarm set Nurse Communication: Mobility status;Other (comment) (Okay to ambulate in room)         Time: 3383-2919 PT Time Calculation (min) (ACUTE ONLY): 14 min   Charges:   PT Evaluation $Initial PT Evaluation Tier I: 1 Procedure     PT G CodesEllouise Newer 09/07/2014, 5:48 PM  Camille Bal [place], [place]

## 2014-09-07 NOTE — Plan of Care (Signed)
Problem: Phase III Progression Outcomes Goal: Voiding independently Outcome: Completed/Met Date Met:  09/07/14     

## 2014-09-07 NOTE — Plan of Care (Signed)
Problem: Phase I Progression Outcomes Goal: Hemodynamically stable Outcome: Completed/Met Date Met:  09/07/14     

## 2014-09-07 NOTE — Plan of Care (Signed)
Problem: Phase I Progression Outcomes Goal: OOB as tolerated unless otherwise ordered Outcome: Completed/Met Date Met:  09/07/14     

## 2014-09-07 NOTE — Plan of Care (Signed)
Problem: Phase III Progression Outcomes Goal: Pain controlled on oral analgesia Outcome: Completed/Met Date Met:  09/07/14

## 2014-09-07 NOTE — Plan of Care (Signed)
Problem: Phase II Progression Outcomes Goal: Vital signs remain stable Outcome: Completed/Met Date Met:  09/07/14

## 2014-09-07 NOTE — Plan of Care (Signed)
Problem: Phase II Progression Outcomes Goal: Progress activity as tolerated unless otherwise ordered Outcome: Completed/Met Date Met:  09/07/14

## 2014-09-07 NOTE — Progress Notes (Signed)
Subjective: Patient reports "What I was thinking and what I was saying were totally different. I'm doing much better now it seems"  Objective: Vital signs in last 24 hours: Temp:  [97.3 F (36.3 C)-97.8 F (36.6 C)] 97.4 F (36.3 C) (11/25 0805) Pulse Rate:  [55-64] 60 (11/25 0800) Resp:  [10-18] 18 (11/25 0800) BP: (97-126)/(49-72) 122/62 mmHg (11/25 0800) SpO2:  [95 %-100 %] 99 % (11/25 0800) Weight:  [102.3 kg (225 lb 8.5 oz)] 102.3 kg (225 lb 8.5 oz) (11/24 2310)  Intake/Output from previous day: 11/24 0701 - 11/25 0700 In: 553.8 [I.V.:553.8] Out: 600 [Urine:600] Intake/Output this shift: Total I/O In: 75 [I.V.:75] Out: -   Alert, conversant, eating breakfast - self care with no apparent deficits. Speaks fluently. Wife present reporting apparent resolution of prior symptoms.  Lab Results:  Recent Labs  09/06/14 2029  WBC 8.2  HGB 12.3*  HCT 37.6*  PLT 158   BMET  Recent Labs  09/06/14 2029  NA 138  K 4.3  CL 100  CO2 23  GLUCOSE 112*  BUN 18  CREATININE 0.88  CALCIUM 9.1    Studies/Results: Ct Head Wo Contrast  09/06/2014   CLINICAL DATA:  Slurred speech.  Seizure activity.  EXAM: CT HEAD WITHOUT CONTRAST  TECHNIQUE: Contiguous axial images were obtained from the base of the skull through the vertex without intravenous contrast.  COMPARISON:  04/28/2014  FINDINGS: There is an acute subdural hematoma overlying the left frontal, parietal, and anterior left temporal lobes. This has a maximum thickness of 1 cm, image 21/ series 201. Smaller right frontal subdural hematoma measures 4 mm. No evidence for acute brain infarct. Right anterior parafalcine subdural hematoma is small measuring 7 mm. Right sided parafalcine subdural hygroma is identified. This is new from the previous exam measuring 3 mm. Patchy low attenuation within the subcortical and periventricular white matter is identified compatible with chronic microvascular disease. The paranasal sinuses are  clear. The mastoid air cells are clear the calvarium is intact.  IMPRESSION: 1. Bilateral and parafalcine acute subdural hematomas are identified. 2. Small vessel ischemic change and brain atrophy. 3. Critical Value/emergent results were called by telephone at the time of interpretation on 09/06/2014 at 8:42 pm to Dr. Nicole Kindred, who verbally acknowledged these results.   Electronically Signed   By: Kerby Moors M.D.   On: 09/06/2014 20:43    Assessment/Plan:   LOS: 1 day  Per DrStern, repeat head CT this am. Ok to be up to chair. Orders entered in [facility].   Verdis Prime 09/07/2014, 8:53 AM

## 2014-09-07 NOTE — Plan of Care (Signed)
Problem: Phase III Progression Outcomes Goal: Foley discontinued Outcome: Completed/Met Date Met:  09/07/14

## 2014-09-07 NOTE — Plan of Care (Signed)
Problem: Phase III Progression Outcomes Goal: Activity at appropriate level-compared to baseline (UP IN CHAIR FOR HEMODIALYSIS)  Outcome: Completed/Met Date Met:  09/07/14     

## 2014-09-07 NOTE — Plan of Care (Signed)
Problem: Phase II Progression Outcomes Goal: Obtain order to discontinue catheter if appropriate Outcome: Completed/Met Date Met:  09/07/14

## 2014-09-07 NOTE — Plan of Care (Signed)
Problem: Phase I Progression Outcomes Goal: Pain controlled with appropriate interventions Outcome: Completed/Met Date Met:  09/07/14     

## 2014-09-07 NOTE — Progress Notes (Signed)
EEG completed; results pending.    

## 2014-09-07 NOTE — Plan of Care (Signed)
Problem: Phase I Progression Outcomes Goal: Voiding-avoid urinary catheter unless indicated Outcome: Completed/Met Date Met:  09/07/14

## 2014-09-07 NOTE — Progress Notes (Signed)
PT Cancellation Note  Patient Details Name: Allen Myers MRN: [id number] DOB: [birth year]   Cancelled Treatment:    Reason Eval/Treat Not Completed: Patient at procedure or test/unavailable Pt having EEG study currently. Will follow up as time allows.   [place], [place]  [name] 09/07/2014, 1:35 PM

## 2014-09-07 NOTE — Progress Notes (Signed)
Patient arrived from [place]. Report revieved from [place]. He denied pain and headache. Safety and orders reviewed with patient. Call light within reach. Alarm activated. TELE applied and confirmed. Will continue to monitor.  Ave Filter, RN

## 2014-09-08 ENCOUNTER — Encounter (HOSPITAL_COMMUNITY): Payer: Self-pay | Admitting: Cardiology

## 2014-09-08 LAB — GLUCOSE, CAPILLARY
GLUCOSE-CAPILLARY: 145 mg/dL — AB (ref 70–99)
GLUCOSE-CAPILLARY: 88 mg/dL (ref 70–99)
Glucose-Capillary: 127 mg/dL — ABNORMAL HIGH (ref 70–99)
Glucose-Capillary: 136 mg/dL — ABNORMAL HIGH (ref 70–99)
Glucose-Capillary: 137 mg/dL — ABNORMAL HIGH (ref 70–99)
Glucose-Capillary: 137 mg/dL — ABNORMAL HIGH (ref 70–99)

## 2014-09-08 MED ORDER — LEVETIRACETAM 750 MG PO TABS
750.0000 mg | ORAL_TABLET | Freq: Two times a day (BID) | ORAL | Status: DC
  Administered 2014-09-08 – 2014-09-11 (×7): 750 mg via ORAL
  Filled 2014-09-08 (×7): qty 1

## 2014-09-08 MED ORDER — ATORVASTATIN CALCIUM 80 MG PO TABS
80.0000 mg | ORAL_TABLET | Freq: Every day | ORAL | Status: DC
  Administered 2014-09-08 – 2014-09-15 (×8): 80 mg via ORAL
  Filled 2014-09-08 (×9): qty 1

## 2014-09-08 MED ORDER — CARVEDILOL 3.125 MG PO TABS
3.1250 mg | ORAL_TABLET | Freq: Two times a day (BID) | ORAL | Status: DC
  Administered 2014-09-08: 3.125 mg via ORAL
  Filled 2014-09-08: qty 1

## 2014-09-08 MED ORDER — LEVETIRACETAM 500 MG PO TABS
500.0000 mg | ORAL_TABLET | Freq: Two times a day (BID) | ORAL | Status: DC
Start: 2014-09-08 — End: 2014-09-08

## 2014-09-08 MED ORDER — LEVOTHYROXINE SODIUM 100 MCG PO TABS
100.0000 ug | ORAL_TABLET | Freq: Every day | ORAL | Status: DC
  Administered 2014-09-08 – 2014-09-16 (×9): 100 ug via ORAL
  Filled 2014-09-08 (×11): qty 1

## 2014-09-08 NOTE — Progress Notes (Signed)
NEURO HOSPITALIST PROGRESS NOTE   SUBJECTIVE:                                                                                                                        Patient feels well today. He had recurrence of speech disturbance with problems finding words. It last about a few minutes and resolved. Patient was on Keppra 500 mg bid at home follow seizures in 3 months ago.  Patient has history of trauma to the head last week where he was accidentally hit a basketball. Since that time he has been experiencing very mild headaches which he has ignored.   OBJECTIVE:                                                                                                                           Vital signs in last 24 hours: Temp:  [97.4 F (36.3 C)-98.6 F (37 C)] 98.3 F (36.8 C) (11/26 0557) Pulse Rate:  [52-73] 61 (11/26 0557) Resp:  [8-20] 16 (11/26 0557) BP: (96-122)/(46-71) 112/61 mmHg (11/26 0557) SpO2:  [95 %-100 %] 95 % (11/26 0557)  Intake/Output from previous day: 11/25 0701 - 11/26 0700 In: 1363.8 [P.O.:480; I.V.:883.8] Out: -  Intake/Output this shift: Total I/O In: 240 [P.O.:240] Out: -  Nutritional status: Diet Carb Modified  Past Medical History  Diagnosis Date  . ALLERGIC RHINITIS 05/07/2007  . HYPERLIPIDEMIA 05/07/2007  . COLONIC POLYPS, HX OF 12/07/2007  . DIVERTICULITIS, HX OF 05/07/2007  . DIVERTICULOSIS, COLON 12/07/2007  . ERECTILE DYSFUNCTION 05/07/2007  . HYPERTENSION 05/16/2008    pt denies  . Cardiomyopathy   . SDH (subdural hematoma)     Following syncopal episode  . SAH (subarachnoid hemorrhage)     Following syncopal episode  . Kidney stones     "passed it"  . Chronic bronchitis     "get it pretty much q yr" (05/11/2014)  . SLEEP APNEA, OBSTRUCTIVE     "don't wear mask" (05/11/2014)  . HYPOTHYROIDISM   . DEPRESSION   . Type II diabetes mellitus   . Seizures     just once a few weeks ago" (05/11/2014)  . CAD (coronary  artery disease)     Physical exam: pleasant male in no apparent  distress. Head: normocephalic. Neck: supple, no bruits, no JVD. Cardiac: no murmurs. Lungs: clear. Abdomen: soft, no tender, no mass. Extremities: no edema.   Neurologic Exam:  Mental Status: Alert, oriented, thought content appropriate. Speech fluent without evidence of aphasia. Able to follow commands without difficulty. Cranial Nerves: II-Visual fields were normal. III/IV/VI-Pupils were equal and reacted. Extraocular movements were full and conjugate.   V/VII-no facial numbness and no facial weakness. VIII-normal. X-normal speech and symmetrical palatal movement. Motor: 5/5 bilaterally with normal tone and bulk Sensory: Normal throughout. Deep Tendon Reflexes: 2+ and symmetric. Plantars: Flexor bilaterally Cerebellar: Normal finger-to-nose testing.  Lab Results: Lab Results  Component Value Date/Time   CHOL 77 07/13/2014 07:36 AM   Lipid Panel No results for input(s): CHOL, TRIG, HDL, CHOLHDL, VLDL, LDLCALC in the last 72 hours.  Studies/Results: Ct Head Wo Contrast  09/07/2014   CLINICAL DATA:  Subdural hematoma followup  EXAM: CT HEAD WITHOUT CONTRAST  TECHNIQUE: Contiguous axial images were obtained from the base of the skull through the vertex without intravenous contrast.  COMPARISON:  CT 09/06/2014  FINDINGS: Left-sided subdural hematoma has mixed density and appears more hypodense compared with yesterday. Hematoma measures approximately 8.4 mm in thickness and is similar to the prior study.  Anterior interhemispheric subdural hematoma unchanged. Small right frontal subdural hematoma unchanged. Interhemispheric low-density hygroma unchanged.  Ventricle size is normal. Mild midline shift to the right of 4 mm is unchanged.  No new hemorrhage.  No acute infarct.  There is generalized atrophy.  IMPRESSION: Bilateral subdural hematomas unchanged. Mild midline shift to the right unchanged. No new hemorrhage.    Electronically Signed   By: Franchot Gallo M.D.   On: 09/07/2014 12:28   Ct Head Wo Contrast  09/06/2014   CLINICAL DATA:  Slurred speech.  Seizure activity.  EXAM: CT HEAD WITHOUT CONTRAST  TECHNIQUE: Contiguous axial images were obtained from the base of the skull through the vertex without intravenous contrast.  COMPARISON:  04/28/2014  FINDINGS: There is an acute subdural hematoma overlying the left frontal, parietal, and anterior left temporal lobes. This has a maximum thickness of 1 cm, image 21/ series 201. Smaller right frontal subdural hematoma measures 4 mm. No evidence for acute brain infarct. Right anterior parafalcine subdural hematoma is small measuring 7 mm. Right sided parafalcine subdural hygroma is identified. This is new from the previous exam measuring 3 mm. Patchy low attenuation within the subcortical and periventricular white matter is identified compatible with chronic microvascular disease. The paranasal sinuses are clear. The mastoid air cells are clear the calvarium is intact.  IMPRESSION: 1. Bilateral and parafalcine acute subdural hematomas are identified. 2. Small vessel ischemic change and brain atrophy. 3. Critical Value/emergent results were called by telephone at the time of interpretation on 09/06/2014 at 8:42 pm to Dr. Nicole Kindred, who verbally acknowledged these results.   Electronically Signed   By: Kerby Moors M.D.   On: 09/06/2014 20:43    MEDICATIONS  Scheduled: . atorvastatin  80 mg Oral Daily  . carvedilol  3.125 mg Oral BID WC  . levETIRAcetam  750 mg Oral BID  . [START ON 09/09/2014] levothyroxine  100 mcg Oral QAC breakfast  . pantoprazole (PROTONIX) IV  40 mg Intravenous QHS  . senna-docusate  1 tablet Oral BID    ASSESSMENT/PLAN:                                                                                                            68 y/o admitted with new onset transient confusion associated with language impairment for approximately 30 minutes. CT brain with new bilateral and parafalcine small SDH. Concern for possible seizure the setting of prior seizures this past summer. He takes Keppra 500 mg bid which has been held since admission. He was found to have a new bleed on ct scan. Patient also on DAPT (aspirin and Brillinta) following coronary stent placement in July 2015. These have been held in the setting of recurrent ICH. EEG is normal.  Plan Will increase Keppra to 750 mg tid as his presentation is concerning for seizure recurrence  Will consult cardiology to advise on DAPT in the setting of recurrent ICH Will be observed for another day to make sure he is stable on his Keppra before discharge.  Discussed with Dr Sharyne Richters, MD PGY-3 Internal Medicine Teaching Service Pager: [phone] 09/08/2014, 9:36 AM    Neurology attending: Patient seen and examined together with Dr Orlene Erm Internal Medicine and I concur with the assessment and plan. Cardiology recommendations greatly appreciated. After initial evaluation this morning, we were called by patient's nurse regarding an episode of sudden " garbled speech" without alteration of consciousness or lateralizing neurological signs. Patient was seen and examined approximately 15 minutes after being notified by the nurse and patient was back to his baseline. We decide to increase his dose of keppra to 750 mg BID and keeping him one more day in the hospital to monitor patient closely. Did not order further neuro-imaging at this moment.    Dorian Pod, MD [facility]

## 2014-09-08 NOTE — Consult Note (Signed)
Cardiology Consult Note  Admit date: 09/06/2014 Name: Allen Myers 68 y.o.  male DOB:  03-12-1946 MRN:  [id number]  Today's date:  09/08/2014  Referring Physician:    Internal medicine  Primary Physician:    Dr. Cathlean Cower  Reason for Consultation:    Management of anticoagulation in the setting of a recent traumatic brain injury   IMPRESSIONS: 1.  Traumatic brain injury with subdural hematoma in the setting of dual antiplatelet therapy for a previous drug-eluting stent 4 months ago. 2.  Placement of a 3.5 x 20 mm drug-eluting stent 4 months ago which is asymptomatic 3.  Hypertensive heart disease. 4.  Hyperlipidemia 5.  History of seizures 6.  Prior history of cardiomyopathy that has resolved 7.  Coronary artery disease with moderate disease in the LAD and circumflex and previous drug-eluting stents to the right coronary artery  RECOMMENDATION:  It has been about 4 months since his placement of a drug-eluting stent.  Although it is desirable to have dual antiplatelet therapy for a year following that, with the recent traumatic brain injury and hemorrhage BRILINTA will need to be discontinued.  When okay with neurosurgery would like to restart low-dose aspirin as soon as possible and depending on recommendations of neurosurgery and the progression of the intracranial hemorrhage decide on whether or not to treat with aspirin alone or whether he could take Plavix in the future at some point which has less inhibition then [facility].  Fortunately, it has been greater than 3 months since the stent implantation and it was a large caliber stent, so at this point, his risk of stent thrombosis has been reduced to a certain extent.  HISTORY: This 68 year old male has a history of coronary artery disease.  He presented with a subarachnoid hemorrhage and subdural hematoma in June following an injury on the roof.  At the time he was found to have a cardiomyopathy with a low ejection fraction.  He had  his subdural treated and eventually had catheterizations the end of July that showed moderate disease in the LAD and moderate to severe circumflex disease with a tight right coronary artery stenosis.  At the time he had placement of a 3.5 x 20 mm drug-eluting stent to the right coronary artery that was dilated to 4.0 mm and he was placed on dual antiplatelet therapy with aspirin and BRILINTA.  He had done well and had completed cardiac rehabilitation.  His left ventricular function improved and his last EF was 50-55%.  He noted that he slipped and had a fall about a week ago but did not have syncope.  He recalls being struck in the head at a basketball game.  I basketball that bounced off the wall several days ago.  He presented with speech difficulties and some transient aphasia and was found to have subdural hematomas that have been managed with observation.  His antiplatelet therapy has been on hold since he was admitted.  He has not had any recurrent cardiac events and did have a transient episode of speech disturbance this morning.  He denies angina and has no PND, orthopnea, or claudication.  Past Medical History  Diagnosis Date  . Hyperlipidemia   . History of colon polyps   . History of diverticulitis   . Erectile dysfunction   . HYPERTENSION   . SDH (subdural hematoma)     Following syncopal episode  . SAH (subarachnoid hemorrhage)     Following syncopal episode  . Kidney stones     "  passed it"  . Sleep apnea in adult   . Hypothyroidism   . Depression   . Type II diabetes mellitus   . Seizures     just once a few weeks ago" (05/11/2014)      Past Surgical History  Procedure Laterality Date  . Colonoscopy    . Polypectomy    . Coronary angioplasty with stent placement  05/11/2014    "1"     Allergies:  has No Known Allergies.   Medications: Prior to Admission medications   Medication Sig Start Date End Date Taking? Authorizing Provider  aspirin EC 81 MG tablet Take 1 tablet  (81 mg total) by mouth daily. 04/28/14  Yes Lelon Perla, MD  atorvastatin (LIPITOR) 80 MG tablet Take 1 tablet (80 mg total) by mouth daily. 06/28/14  Yes Lelon Perla, MD  carvedilol (COREG) 3.125 MG tablet Take 1 tablet (3.125 mg total) by mouth 2 (two) times daily with a meal. 07/26/14  Yes Biagio Borg, MD  levETIRAcetam (KEPPRA) 500 MG tablet Take 1 tablet (500 mg total) by mouth 2 (two) times daily. 06/01/14  Yes Biagio Borg, MD  levothyroxine (SYNTHROID, LEVOTHROID) 100 MCG tablet Take 1 tablet (100 mcg total) by mouth daily before breakfast. 06/01/14  Yes Biagio Borg, MD  linagliptin (TRADJENTA) 5 MG TABS tablet Take 5 mg by mouth daily.   Yes Historical Provider, MD  lisinopril (PRINIVIL,ZESTRIL) 2.5 MG tablet Take 1 tablet (2.5 mg total) by mouth daily. 07/26/14  Yes Biagio Borg, MD  metFORMIN (GLUCOPHAGE) 1000 MG tablet Take 1 tablet (1,000 mg total) by mouth 2 (two) times daily with a meal. 06/01/14  Yes Biagio Borg, MD  pioglitazone (ACTOS) 45 MG tablet Take 1 tablet (45 mg total) by mouth daily. 06/01/14  Yes Biagio Borg, MD  ticagrelor (BRILINTA) 90 MG TABS tablet Take 1 tablet (90 mg total) by mouth 2 (two) times daily. 06/07/14  Yes Lelon Perla, MD  acyclovir (ZOVIRAX) 200 MG capsule Take 1 capsule (200 mg total) by mouth 5 (five) times daily. For 5 days Patient not taking: Reported on 09/06/2014 07/14/14   Biagio Borg, MD  glucose blood (ONE TOUCH TEST STRIPS) test strip Use as directed twice daily to check blood sugar.  Diagnosis code 250.00 04/21/14   Biagio Borg, MD  nitroGLYCERIN (NITROSTAT) 0.4 MG SL tablet Place 1 tablet (0.4 mg total) under the tongue every 5 (five) minutes as needed for chest pain. 05/12/14   Brett Canales, PA-C    Family History: Family Status  Relation Status Death Age  . Father Deceased     PGD and CAD  . Mother Deceased   . Sister Other     aplastic anemia    Social History:   reports that he quit smoking about 30 years ago. His  smoking use included Cigarettes. He has a 30 pack-year smoking history. He has never used smokeless tobacco. He reports that he drinks about 0.6 oz of alcohol per week. He reports that he does not use illicit drugs.   Review of Systems: Unremarkable except as noted above.  Physical Exam: BP 141/75 mmHg  Pulse 65  Temp(Src) 98 F (36.7 C) (Oral)  Resp 18  Ht 6\' 1"  (1.854 m)  Wt 102.3 kg (225 lb 8.5 oz)  BMI 29.76 kg/m2  SpO2 99%  General appearance: Pleasant mildly overweight white male in no acute distress Head: Normocephalic, without obvious abnormality, atraumatic Eyes: conjunctivae/corneas  clear. PERRL, EOM's intact. Fundi benign. Neck: no adenopathy, no carotid bruit, no JVD and supple, symmetrical, trachea midline Lungs: clear to auscultation bilaterally Heart: regular rate and rhythm, S1, S2 normal, no murmur, click, rub or gallop Abdomen: soft, non-tender; bowel sounds normal; no masses,  no organomegaly Rectal: deferred Extremities: extremities normal, atraumatic, no cyanosis or edema Pulses: 2+ and symmetric Skin: Skin color, texture, turgor normal. No rashes or lesions Neurologic: Grossly normal  Labs: CBC  Recent Labs  09/06/14 2029  WBC 8.2  RBC 4.25  HGB 12.3*  HCT 37.6*  PLT 158  MCV 88.5  MCH 28.9  MCHC 32.7  RDW 15.0  LYMPHSABS 2.5  MONOABS 0.9  EOSABS 0.3  BASOSABS 0.0   CMP   Recent Labs  09/06/14 2029  NA 138  K 4.3  CL 100  CO2 23  GLUCOSE 112*  BUN 18  CREATININE 0.88  CALCIUM 9.1  PROT 7.3  ALBUMIN 3.7  AST 20  ALT 15  ALKPHOS 110  BILITOT 0.6  GFRNONAA 86*  GFRAA >90   BNP (last 3 results)  Recent Labs  04/02/14 0833  PROBNP 271.0*    Radiology: No chest x-ray done this admission  EKG: Early transition noted on EKG, no acute ischemic changes  Signed:  W. Doristine Church MD [facility]   Cardiology Consultant  09/08/2014, 11:16 AM

## 2014-09-09 ENCOUNTER — Encounter (HOSPITAL_COMMUNITY): Payer: Managed Care, Other (non HMO)

## 2014-09-09 ENCOUNTER — Inpatient Hospital Stay (HOSPITAL_COMMUNITY): Payer: Managed Care, Other (non HMO)

## 2014-09-09 DIAGNOSIS — I2510 Atherosclerotic heart disease of native coronary artery without angina pectoris: Secondary | ICD-10-CM

## 2014-09-09 LAB — GLUCOSE, CAPILLARY
GLUCOSE-CAPILLARY: 112 mg/dL — AB (ref 70–99)
GLUCOSE-CAPILLARY: 120 mg/dL — AB (ref 70–99)
GLUCOSE-CAPILLARY: 124 mg/dL — AB (ref 70–99)
GLUCOSE-CAPILLARY: 89 mg/dL (ref 70–99)
Glucose-Capillary: 118 mg/dL — ABNORMAL HIGH (ref 70–99)
Glucose-Capillary: 100 mg/dL — ABNORMAL HIGH (ref 70–99)

## 2014-09-09 MED ORDER — LORAZEPAM 2 MG/ML IJ SOLN
INTRAMUSCULAR | Status: AC
  Filled 2014-09-09: qty 1

## 2014-09-09 MED ORDER — LEVETIRACETAM 750 MG PO TABS: 750.0000 mg | ORAL_TABLET | Freq: Two times a day (BID) | ORAL | Status: DC

## 2014-09-09 MED ORDER — LORAZEPAM 2 MG/ML IJ SOLN
1.0000 mg | Freq: Once | INTRAMUSCULAR | Status: AC
  Administered 2014-09-09: 1 mg via INTRAVENOUS
  Filled 2014-09-09: qty 1

## 2014-09-09 MED ORDER — LEVETIRACETAM 500 MG PO TABS
500.0000 mg | ORAL_TABLET | Freq: Once | ORAL | Status: AC
  Administered 2014-09-09: 500 mg via ORAL
  Filled 2014-09-09: qty 1

## 2014-09-09 MED ORDER — PANTOPRAZOLE SODIUM 40 MG PO TBEC
40.0000 mg | DELAYED_RELEASE_TABLET | Freq: Every day | ORAL | Status: DC
  Administered 2014-09-09 – 2014-09-11 (×3): 40 mg via ORAL
  Filled 2014-09-09 (×3): qty 1

## 2014-09-09 NOTE — Discharge Summary (Signed)
Physician Discharge Summary   Patient ID: Allen Myers [id number] 68 y.o. [birth year]  Admit date: 09/06/2014  Discharge date and time: 09/09/2014, 9:06 AM  Admitting Physician: Wallie Char   Discharge Physician: Dr Dorian Pod  Admission Diagnoses: Stroke [I63.9]  Discharge Diagnoses: recurrent Intracranial Hemorrhage, and questionable non-convulsive seizures.  Admission Condition: stable  Discharged Condition: good  Indication for Admission: Allen Myers is an 68 y.o. male history of hypertension, diabetes mellitus, subarachnoid hemorrhage and subdural hematoma June 2015, coronary artery disease with stent placement and seizure disorder, who was brought to the emergency room following onset of speech output difficulty as well as complaint of headache which started at 7:05 PM. There was no confusion and no loss of consciousness. Patient has history of trauma to the head last week where he was accidentally hit a basketball. Since that time he has been experiencing very mild headaches which he has ignored. No focal seizure activity noted. He had an episode of transient numbness involving his right cheek and right side of his mouth 5 days ago. CT scan of his head showed bilateral hand parafalcine acute subdural hematomas. No midline shift is noted. Patient has been taking aspirin and Brilinta for antiplatelet therapy. Speech deficit and headache resolved.  Hospital Course: During his hospitalization, he did well. Throughout his hospital stay, his neurologic exam remained intact. However, he suffered 2 episodes of recurrence of trouble finding words, which were very brief returning to baseline within 1-3 minutes. These were thought to be secondary to recurrence of probable focal seizure without impairment of consciousness. Again, his neurologic exam was stable and normal. His home Keppra dose was increased from 500 mg bid daily to 750 mg twice a day. Cardiology was consulted with  question of antiplatelet therapy in the setting of recent intracranial bleed with recurrence. They recommended holding both aspirin and Brillinta and consider to restarted aspirin as soon as possible if and when neurology feels it is safe. Cardiology also recommended to Consider restarting Plavix at some point - if OK with Neuro. Also neurosurgery evaluated the patient in consultation and they did not recommend any surgical intervention. His blood pressure was aggressively managed to keep it under 140/90 in the setting of intracranial bleed. On the day of his discharge, BP was 99/52. Patient had resumed taking his Coreg at a dose of 3.125 mg twice a day. This was continued on discharge but home lisinopril 2.5 mg daily was held due to soft BP. He will follow-up with his PCP to consider resuming his lisinopril if needed. Patient will follow-up with neurology within 1 week with a plan to repeat a head CT scan and if there is no bleed, they can consider resuming his Aspirin given his significant CAD history. He will also follow-up with cardiology in about 2-3 weeks. I encouraged him to make up a follow-up appointment with his primary care physician in about a month. I will route this discharge summary to his Cathlean Cower, MD, his PCP.    Consults: cardiology and Neurosurgery.  Significant Diagnostic Studies: CT scan of his head showed bilateral hand parafalcine acute subdural hematomas. No midline shift is noted.   Treatments: increased Keppra from 500 mg bid to 750 mg bid Maintained blood pressure <130/90  Discharge Exam: Filed Vitals:   09/09/14 0618  BP: 99/52  Pulse: 60  Temp: 97.5 F (36.4 C)  Resp: 20  Physical exam: pleasant male in no apparent distress. Head: normocephalic. Neck: supple, no bruits, no JVD. Cardiac:  no murmurs. Lungs: clear. Abdomen: soft, no tender, no mass. Extremities: no edema. Neurologic Exam:  Mental Status: Alert, oriented, thought content appropriate. Speech  fluent without evidence of aphasia. Able to follow commands without difficulty. Wife at bedside Cranial Nerves: II-Visual fields were normal. III/IV/VI-Pupils were equal and reacted. Extraocular movements were full and conjugate.   V/VII-no facial numbness and no facial weakness. VIII-normal. X-normal speech and symmetrical palatal movement. Motor: 5/5 bilaterally with normal tone and bulk Sensory: Normal throughout. Deep Tendon Reflexes: 2+ and symmetric. Plantars: Flexor bilaterally Cerebellar: Normal finger-to-nose testing.  Disposition: 01-Home or Self Care   Patient Instructions:   Thank you for allowing [place] to be involved in your healthcare while you were hospitalized at [facility].   Please note that there have been changes to your home medications.  --> PLEASE LOOK AT YOUR DISCHARGE MEDICATION LIST FOR DETAILS.  Please stop taking Aspirin and Brillinta.   Please increase the dose of Keppra from 500 mg twice a day to 750 mg twice a day  Please stop taking Lisinopril until you're evaluated as outpatient. Your blood pressure was somehow low  It is very important and that you follow-up with Dr. Leonie Man within 1 week. Please call to make an appointment. He may want to start you back on Aspirin after head CT scan.  Please call your PCP if you have any questions or concerns, or any difficulty getting any of your medications.  Please return to the ER if you have worsening of your symptoms or new severe symptoms arise.   Medication List    STOP taking these medications        aspirin EC 81 MG tablet     lisinopril 2.5 MG tablet  Commonly known as:  PRINIVIL,ZESTRIL     ticagrelor 90 MG Tabs tablet  Commonly known as:  BRILINTA      TAKE these medications        acyclovir 200 MG capsule  Commonly known as:  ZOVIRAX  Take 1 capsule (200 mg total) by mouth 5 (five) times daily. For 5 days     atorvastatin 80 MG tablet  Commonly known as:  LIPITOR   Take 1 tablet (80 mg total) by mouth daily.     carvedilol 3.125 MG tablet  Commonly known as:  COREG  Take 1 tablet (3.125 mg total) by mouth 2 (two) times daily with a meal.     glucose blood test strip  Commonly known as:  ONE TOUCH TEST STRIPS  Use as directed twice daily to check blood sugar.  Diagnosis code 250.00     levETIRAcetam 750 MG tablet  Commonly known as:  KEPPRA  Take 1 tablet (750 mg total) by mouth 2 (two) times daily.     levothyroxine 100 MCG tablet  Commonly known as:  SYNTHROID, LEVOTHROID  Take 1 tablet (100 mcg total) by mouth daily before breakfast.     linagliptin 5 MG Tabs tablet  Commonly known as:  TRADJENTA  Take 5 mg by mouth daily.     metFORMIN 1000 MG tablet  Commonly known as:  GLUCOPHAGE  Take 1 tablet (1,000 mg total) by mouth 2 (two) times daily with a meal.     nitroGLYCERIN 0.4 MG SL tablet  Commonly known as:  NITROSTAT  Place 1 tablet (0.4 mg total) under the tongue every 5 (five) minutes as needed for chest pain.     pioglitazone 45 MG tablet  Commonly known as:  ACTOS  Take 1 tablet (45 mg total) by mouth daily.        Follow-up Information    Follow up with Cathlean Cower, MD. Schedule an appointment as soon as possible for a visit in 1 month.   Specialties:  Internal Medicine, Radiology   Contact information:   [address] [place] [address] [phone]       Follow up with SETHI,PRAMOD, MD. Schedule an appointment as soon as possible for a visit in 1 week.   Specialties:  Neurology, Radiology   Contact information:   [address] [place] [address] [phone]       Follow up with Kirk Ruths, MD. Schedule an appointment as soon as possible for a visit in 1 month.   Specialty:  Cardiology   Contact information:   [address] [place] [address] [phone]       Activity: activity as tolerated. No lifting, driving, or strenuous exercise for 2-3 weeks. Diet:  cardiac diet Wound Care: none needed  Follow-up with Dr Theresia Lo in 1 week.  Discharge plan discussed with Dr Armida Sans.  Signed:  Jessee Avers, MD PGY-3 Internal Medicine Teaching Service Pager: [phone] 09/09/2014, 9:20 AM   Patient seen and examined, and I agree with the assessment and plan delineated in the discharge summary.  Dorian Pod, MD [facility]

## 2014-09-09 NOTE — Progress Notes (Signed)
NEURO HOSPITALIST PROGRESS NOTE   SUBJECTIVE:                                                                                                                        Patient feels well today. However, he reports an episode of difficulty finding words which happened last evening at around 7 PM. He also describes sensation of failure to use his upper left extremity when he attempted to make a phone call. This episode was very brief less than 2 minutes and improved when he took a walk. His Keppra has been increased to 750 mg twice a day.  OBJECTIVE:                                                                                                                           Vital signs in last 24 hours: Temp:  [97.5 F (36.4 C)-98.2 F (36.8 C)] 98 F (36.7 C) (11/27 0926) Pulse Rate:  [56-66] 56 (11/27 0926) Resp:  [16-20] 18 (11/27 0926) BP: (99-141)/(52-75) 108/68 mmHg (11/27 0926) SpO2:  [94 %-99 %] 96 % (11/27 0926)  Intake/Output from previous day: 11/26 0701 - 11/27 0700 In: 240 [P.O.:240] Out: -  Intake/Output this shift:   Nutritional status: Diet Carb Modified Diet - low sodium heart healthy  Past Medical History  Diagnosis Date  . Hyperlipidemia   . History of colon polyps   . History of diverticulitis   . Erectile dysfunction   . HYPERTENSION   . SDH (subdural hematoma)     Following syncopal episode  . SAH (subarachnoid hemorrhage)     Following syncopal episode  . Kidney stones     "passed it"  . Sleep apnea in adult   . Hypothyroidism   . Depression   . Type II diabetes mellitus   . Seizures     just once a few weeks ago" (05/11/2014)    Physical exam: pleasant male in no apparent distress. Head: normocephalic. Neck: supple, no bruits, no JVD. Cardiac: no murmurs. Lungs: clear. Abdomen: soft, no tender, no mass. Extremities: no edema.   Neurologic Exam:  Mental Status: Alert, oriented, thought content  appropriate. Speech fluent without evidence of aphasia. Able to follow commands without difficulty. Wife at bedside  Cranial Nerves: II-Visual fields were normal. III/IV/VI-Pupils were equal and reacted. Extraocular movements were full and conjugate.   V/VII-no facial numbness and no facial weakness. VIII-normal. X-normal speech and symmetrical palatal movement. Motor: 5/5 bilaterally with normal tone and bulk Sensory: Normal throughout. Deep Tendon Reflexes: 2+ and symmetric. Plantars: Flexor bilaterally Cerebellar: Normal finger-to-nose testing.  Lab Results: Lab Results  Component Value Date/Time   CHOL 77 07/13/2014 07:36 AM   Lipid Panel No results for input(s): CHOL, TRIG, HDL, CHOLHDL, VLDL, LDLCALC in the last 72 hours.  Studies/Results: Ct Head Wo Contrast  09/07/2014   CLINICAL DATA:  Subdural hematoma followup  EXAM: CT HEAD WITHOUT CONTRAST  TECHNIQUE: Contiguous axial images were obtained from the base of the skull through the vertex without intravenous contrast.  COMPARISON:  CT 09/06/2014  FINDINGS: Left-sided subdural hematoma has mixed density and appears more hypodense compared with yesterday. Hematoma measures approximately 8.4 mm in thickness and is similar to the prior study.  Anterior interhemispheric subdural hematoma unchanged. Small right frontal subdural hematoma unchanged. Interhemispheric low-density hygroma unchanged.  Ventricle size is normal. Mild midline shift to the right of 4 mm is unchanged.  No new hemorrhage.  No acute infarct.  There is generalized atrophy.  IMPRESSION: Bilateral subdural hematomas unchanged. Mild midline shift to the right unchanged. No new hemorrhage.   Electronically Signed   By: Franchot Gallo M.D.   On: 09/07/2014 12:28    MEDICATIONS                                                                                                                        Scheduled: . atorvastatin  80 mg Oral q1800  . carvedilol  3.125 mg Oral  BID WC  . levETIRAcetam  750 mg Oral BID  . levothyroxine  100 mcg Oral QAC breakfast  . pantoprazole (PROTONIX) IV  40 mg Intravenous QHS  . senna-docusate  1 tablet Oral BID    ASSESSMENT/PLAN:                                                                                                           68 y/o admitted with new onset transient confusion associated with language impairment for approximately 30 minutes. CT brain with new bilateral and parafalcine small SDH. Concern for possible seizure the setting of prior seizures this past summer. At home he takes Keppra 500 mg bid was held for 1 day. He was found to have a new bleed on ct scan. Patient also on DAPT (aspirin and Brillinta) following coronary  stent placement in July 2015. These have been held in the setting of recurrent ICH. EEG is normal. Cardiology recommended stopping breathing to as the rest of stent stenosis is low. Patient has been on duo platelet therapy for 4 months already. His recurrent dysarthric episodes are most likely focal seizures without impairment of consciousness with intracranial bleeds likely focus for the seizures. Plan  Will continue Keppra to 750 mg bid. This dose can be titrated up in the outpatient setting if he has recurrence of symptoms.  The plan will be to repeat a head CT scan in about one week. If there is no more bleeding, consideration to resume aspirin. Patient will be discharged today with follow-up with neurology in 1 week, cardiology in about 2 weeks, and his PCP in about a month.  Discussed with Dr Sharyne Richters, MD PGY-3 Internal Medicine Teaching Service Pager: [phone] 09/09/2014, 9:32 AM   Patient seen and examined together with Dr. Alice Rieger, PGY-3 internal medicine resident, and I concur with the assessment and plan.  Dorian Pod, MD

## 2014-09-09 NOTE — Progress Notes (Signed)
PROGRESS NOTE  Subjective:   This 68 year old male has a history of coronary artery disease. He presented with a subarachnoid hemorrhage and subdural hematoma in June following an injury on the roof. At the time he was found to have a cardiomyopathy with a low ejection fraction. He had his subdural treated and eventually had catheterizations the end of July that showed moderate disease in the LAD and moderate to severe circumflex disease with a tight right coronary artery stenosis. At the time he had placement of a 3.5 x 20 mm drug-eluting stent to the right coronary artery that was dilated to 4.0 mm and he was placed on dual antiplatelet therapy with aspirin and BRILINTA. He had done well and had completed cardiac rehabilitation. His left ventricular function improved and his last EF was 50-55%. He noted that he slipped and had a fall about a week ago but did not have syncope. He recalls being struck in the head at a basketball game. He presented with speech difficulties and some transient aphasia and was found to have subdural hematomas that have been managed with observation  He is scheduled to be discharged today.   Objective:    Vital Signs:   Temp:  [97.5 F (36.4 C)-98.2 F (36.8 C)] 97.5 F (36.4 C) (11/27 0618) Pulse Rate:  [57-66] 60 (11/27 0618) Resp:  [16-20] 20 (11/27 0618) BP: (99-141)/(52-75) 99/52 mmHg (11/27 0618) SpO2:  [94 %-99 %] 96 % (11/27 0618)  Last BM Date: 09/08/14   24-hour weight change: Weight change:   Weight trends: Filed Weights   09/06/14 2310  Weight: 225 lb 8.5 oz (102.3 kg)    Intake/Output:  11/26 0701 - 11/27 0700 In: 240 [P.O.:240] Out: -      Physical Exam: BP 99/52 mmHg  Pulse 60  Temp(Src) 97.5 F (36.4 C) (Oral)  Resp 20  Ht 6\' 1"  (1.854 m)  Wt 225 lb 8.5 oz (102.3 kg)  BMI 29.76 kg/m2  SpO2 96%  Wt Readings from Last 3 Encounters:  09/06/14 225 lb 8.5 oz (102.3 kg)  07/28/14 234 lb 12.8 oz (106.505 kg)    07/26/14 235 lb 8 oz (106.822 kg)    General: Vital signs reviewed and noted.   Head: Normocephalic, atraumatic.  Eyes: conjunctivae/corneas clear.  EOM's intact.   Throat: normal  Neck:  normal   Lungs:     Clear   Heart:   RR, normal  S1S2   Abdomen:  Soft, non-tender, non-distended    Extremities: No edema    Neurologic: A&O X3, CN II - XII are grossly intact.   Psych: Normal     Labs: BMET:  Recent Labs  09/06/14 2029  NA 138  K 4.3  CL 100  CO2 23  GLUCOSE 112*  BUN 18  CREATININE 0.88  CALCIUM 9.1    Liver function tests:  Recent Labs  09/06/14 2029  AST 20  ALT 15  ALKPHOS 110  BILITOT 0.6  PROT 7.3  ALBUMIN 3.7   No results for input(s): LIPASE, AMYLASE in the last 72 hours.  CBC:  Recent Labs  09/06/14 2029  WBC 8.2  NEUTROABS 4.4  HGB 12.3*  HCT 37.6*  MCV 88.5  PLT 158    Cardiac Enzymes: No results for input(s): CKTOTAL, CKMB, TROPONINI in the last 72 hours.  Coagulation Studies:  Recent Labs  09/06/14 2029  LABPROT 14.0  INR 1.07    Other: Invalid input(s): POCBNP No results for input(s):  DDIMER in the last 72 hours. No results for input(s): HGBA1C in the last 72 hours. No results for input(s): CHOL, HDL, LDLCALC, TRIG, CHOLHDL in the last 72 hours. No results for input(s): TSH, T4TOTAL, T3FREE, THYROIDAB in the last 72 hours.  Invalid input(s): FREET3 No results for input(s): VITAMINB12, FOLATE, FERRITIN, TIBC, IRON, RETICCTPCT in the last 72 hours.   Other results:    Medications:    Infusions: . sodium chloride 1,000 mL (09/08/14 0504)    Scheduled Medications: . atorvastatin  80 mg Oral q1800  . carvedilol  3.125 mg Oral BID WC  . levETIRAcetam  750 mg Oral BID  . levothyroxine  100 mcg Oral QAC breakfast  . pantoprazole (PROTONIX) IV  40 mg Intravenous QHS  . senna-docusate  1 tablet Oral BID    Assessment/ Plan:   Active Problems:   Diabetes mellitus type 2, noninsulin dependent    Hypertensive heart disease   Seizure   Coronary atherosclerosis of native coronary artery   SDH (subdural hematoma)  1. CAD - s/p stenting with a DES 4 months ago. Brilinta has been stopped.   A.  I would recommend restarting ASA as soon as possible.  The family mentioned that starting ASA  in a week after the visit with Dr. Leonie Man  B.  Consider restarting Plavix at some point - if OK with Neuro.    Please have him see Dr. Stanford Breed ( or our PA / NP) in ~1 month  He and his wife know to call us sooner if he has any angina   Disposition:  Ok for DC today Length of Stay: 3  Thayer Headings, Brooke Bonito., MD, [facility] 09/09/2014, 8:49 AM Office [phone] Pager [phone]

## 2014-09-09 NOTE — Progress Notes (Signed)
I was asked to see this patient. He has been seen by Drs. NundKumar and Vertell Limber for a left subdural hematoma. There was plans for him to be discharged home but he started having episodic feelings of weakness in the right upper extremity. He has had difficulty with word finding but that has not progressed or gotten worse. CT scan on 1125 showed an 8.5 mm extended density left extra-axial fluid collection consistent with subacute/chronic subdural hematoma. They did an MRI today to rule out stroke since he was having episodic weakness in the right arm and this showed what it measured to be 11 mm left extra-axial fluid collection. They have increased his Keppra thinking this may be related to seizures related to the subdural hematoma.  He is awake and alert. He moves all extremities equally. He does have some dysphasic errors and some difficulty with word finding. He denies significant headache. He looks to be comfortable.  I have reviewed his films. I have examined the patient. I have spoken to the patient and 3 family members. I think this is most likely secondary to cortical irritation from the extra-axial blood. This is quite common. Otherwise he looks quite good. I do not believe that this hastens the need for surgical intervention. Do not believe the change in the size of the subdural hematoma comparing MRI to CT scan is a cause of concern at this point. That can simply be differences in CT versus MRI and the sensitivity differences between the 2 studies. I agree with increasing the Keppra. I would keep him at least one more night here in the hospital for observation. The question is whether or not he is going to need bur holes or a small craniotomy for evacuation of the subdural hematoma. At this point I think he deserves further observation but I have spoken at length with the family about the decision making process and the serial imaging/serial follow-up that is used to help determine this decision making.  They seem satisfied

## 2014-09-09 NOTE — Progress Notes (Signed)
S I was called by patient's wife to evaluate the patient with an episode of right arm weakness and difficulty in finding words. He had two episodes within 15 minutes at around 1015am.  PE Neurologic exam with weakness in 4/5 in the RUE and could not left with hand otherwise exam unchanged. Speech is slow but normal.  Assessment/plan: Likely symptoms are due to recurrent seizures with extremity weakness vs stroke.   - dicussed with Dr Armida Sans who recommended an additional Keppra 500 mg  - cont with Keppra 500 mg bid - order brain MRI/ MRA - hold coreg  - hold discharge for now. discussed with patient and his wife.  - may consider EEG if episodes continue   Jessee Avers, MD PGY-3 Internal Medicine Teaching Service Pager: [phone] 09/09/2014, 10:28 AM   Addendum: 2:42 PM 09/09/2014 Reviewed the brain MRI brain MRA which did not show a stroke but revealed subacute subdural hematomas bilaterally are stable, measuring up to 11 mm in thickness on the left. No new hemorrhage. The midline shift is unchanged at 3 mm. Discussed results over the phone with Dr Ronnald Ramp of neurosurgery. Given his RUE weakness, there is concern for compressive manifestation of the left subdural hematoma (measuring up to 74mm) which could explain his symtoms Plan  - Dr Ronnald Ramp to evaluate patient later today to determine if surgical evacuation of the hematoma might be required.   - patient will be kept for at least another day  - discussed with family about plan  - discussed with Dr Armida Sans   Jessee Avers, MD PGY-3 Internal Medicine Teaching Service Pager: [phone] 09/09/2014, 2:46 PM     Patient seen and examined together with PGY-3 internal medicine resident Dr Alice Rieger and I concur with the assessment and plan.  Dorian Pod, MD

## 2014-09-09 NOTE — Discharge Instructions (Signed)
·   Thank you for allowing [place] to be involved in your healthcare while you were hospitalized at [facility].   Please note that there have been changes to your home medications.  --> PLEASE LOOK AT YOUR DISCHARGE MEDICATION LIST FOR DETAILS.  Please stop taking Aspirin and Brillinta.   Please increase the dose of Keppra from 500 mg twice a day to 750 mg twice a day  Please stop taking Lisinopril until you're evaluated as outpatient. Your blood pressure was somehow low  It is very important and that you follow-up with Dr. Leonie Man within 1 week. Please call to make an appointment. He may want to start you back on Aspirin after head CT scan.  Please call your PCP if you have any questions or concerns, or any difficulty getting any of your medications.  Please return to the ER if you have worsening of your symptoms or new severe symptoms arise.

## 2014-09-10 ENCOUNTER — Inpatient Hospital Stay (HOSPITAL_COMMUNITY): Payer: Managed Care, Other (non HMO)

## 2014-09-10 LAB — GLUCOSE, CAPILLARY
GLUCOSE-CAPILLARY: 123 mg/dL — AB (ref 70–99)
GLUCOSE-CAPILLARY: 121 mg/dL — AB (ref 70–99)
GLUCOSE-CAPILLARY: 116 mg/dL — AB (ref 70–99)
Glucose-Capillary: 102 mg/dL — ABNORMAL HIGH (ref 70–99)

## 2014-09-10 NOTE — Progress Notes (Signed)
Overall stable. No headache. No further seizures. Still with a small amount of weakness in his right hand. Up ambulating without difficulty. No speech or word frogging difficulties today.  Follow-up head CT scan essentially stable. Patient with a significant left convexity mixed density collection. Overall this does not cause significant mass effect. Recommend continued efforts at treatment with Keppra as an antiepileptic and observation of this extra-axial fluid collection.

## 2014-09-10 NOTE — Evaluation (Signed)
Occupational Therapy Evaluation and Discharge Patient Details Name: Allen Myers MRN: [id number] DOB: [birth year] Today's Date: 09/10/2014    History of Present Illness 68 y/o admitted with new onset transient confusion associated with language impairment for approximately 30 minutes. CT brain with new bilateral and parafalcine small SDH. Was to be D/C'd a couple of days ago but developed increased issues with use of RUE and increased speech issues--no change in MRI   Clinical Impression   [name] admitted with above presents to acute OT with all education completed, we will D/C from acute OT.    Follow Up Recommendations  No OT follow up    Equipment Recommendations  None recommended by OT       Precautions / Restrictions Precautions Precautions: None Restrictions Weight Bearing Restrictions: No      Mobility Bed Mobility Overal bed mobility: Independent                Transfers Overall transfer level: Independent                    Balance Overall balance assessment: No apparent balance deficits (not formally assessed)                                          ADL Overall ADL's : Needs assistance/impaired Eating/Feeding: Modified independent (uses LUE more than right)   Grooming: Modified independent;Standing (increased time and focus on what RUE is doing)   Upper Body Bathing: Modified independent (increased time and focus on what RUE is doing; advised to use squeeze soap v. bar to hopefully lessen the chance of dropping it)   Lower Body Bathing: Modified independent (increased time and focus on what RUE is doing)   Upper Body Dressing : Minimal assistance (for FM (such as buttons))   Lower Body Dressing: Minimal assistance (for FM (such as buttons and/or snaps, tying shoes)   Toilet Transfer: Independent   Toileting- Clothing Manipulation and Hygiene: Modified independent (increased time and focus on what RUE is  doing)   Tub/ Banker: Modified independent                     Pertinent Vitals/Pain Pain Assessment: No/denies pain     Hand Dominance Right   Extremity/Trunk Assessment Upper Extremity Assessment Upper Extremity Assessment: RUE deficits/detail RUE Deficits / Details: Decreased FM and GM with all movements being slower compared to the LUE RUE Coordination: decreased fine motor;decreased gross motor           Communication Communication Communication: No difficulties   Cognition Arousal/Alertness: Awake/alert Behavior During Therapy: WFL for tasks assessed/performed Overall Cognitive Status: Within Functional Limits for tasks assessed                        Exercises   Other Exercises Other Exercises: Gave pt handout of FM/GM activities as well as theraputty handout for him to keep working on his RUE strength and coordination. I went over each of the activities with him, had him return demonstrate, and his wife and daughter were there to so they can cue him if needed.        Home Living Family/patient expects to be discharged to:: Private residence Living Arrangements: Spouse/significant other Available Help at Discharge: Family;Available 24 hours/day Type of Home: House Home Access: Level entry  Home Layout: Two level;Bed/bath upstairs Alternate Level Stairs-Number of Steps: 12 Alternate Level Stairs-Rails: Right;Left Bathroom Shower/Tub: Tub/shower unit         Home Equipment: None          Prior Functioning/Environment Level of Independence: Independent        Comments: works at a desk job, Radio broadcast assistant Diagnosis: Hemiplegia dominant side   OT Problem List: Decreased strength;Impaired UE functional use;Decreased coordination      OT Goals(Current goals can be found in the care plan section) Acute Rehab OT Goals Patient Stated Goal: go home today  OT Frequency:                End of Session Nurse  Communication:  (Pt is safe to be up and about with family)  Activity Tolerance: Patient tolerated treatment well Patient left: in chair;with family/visitor present   Time: 1205-1254 OT Time Calculation (min): 49 min Charges:  OT General Charges $OT Visit: 1 Procedure OT Evaluation $Initial OT Evaluation Tier I: 1 Procedure OT Treatments $Therapeutic Activity: 38-52 mins  Almon Register [phone] 09/10/2014, 2:33 PM

## 2014-09-10 NOTE — Progress Notes (Signed)
NEURO HOSPITALIST PROGRESS NOTE   SUBJECTIVE:                                                                                                                        Complains of worsening weakness right hand and mild HA. Wife is at the bedside and said that she thinks " everything is about the same, the speech is actually better this morning". Stated that he couldn't use his right hand when having breakfast this morning. Yesterday sustained another episode of speech disturbance that this time was associated with right arm weakness and thus MRI brain was obtained and showed no acute intracranial abnormality, stable subacute bilateral SDH. Neurosurgery input greatly appreciated. On keppra 750 mg BID without reported side effects.  OBJECTIVE:                                                                                                                           Vital signs in last 24 hours: Temp:  [97.6 F (36.4 C)-98.1 F (36.7 C)] 98.1 F (36.7 C) (11/28 0552) Pulse Rate:  [56-61] 61 (11/28 0552) Resp:  [16-20] 16 (11/28 0552) BP: (104-121)/(53-70) 109/60 mmHg (11/28 0552) SpO2:  [95 %-98 %] 95 % (11/28 0552)  Intake/Output from previous day:   Intake/Output this shift:   Nutritional status: Diet Carb Modified Diet - low sodium heart healthy  Past Medical History  Diagnosis Date  . Hyperlipidemia   . History of colon polyps   . History of diverticulitis   . Erectile dysfunction   . HYPERTENSION   . SDH (subdural hematoma)     Following syncopal episode  . SAH (subarachnoid hemorrhage)     Following syncopal episode  . Kidney stones     "passed it"  . Sleep apnea in adult   . Hypothyroidism   . Depression   . Type II diabetes mellitus   . Seizures     just once a few weeks ago" (05/11/2014)   Physical exam: pleasant male in no apparent distress. Head: normocephalic. Neck: supple, no bruits, no JVD. Cardiac: no murmurs. Lungs:  clear. Abdomen: soft, no tender, no mass. Extremities: no edema.  Neurologic Exam:  General: Mental Status: Alert, oriented, thought content appropriate.  Speech fluent  without evidence of aphasia.  Able to follow 3 step commands without difficulty. Cranial Nerves: II: Discs flat bilaterally; Visual fields grossly normal, pupils equal, round, reactive to light and accommodation III,IV, VI: ptosis not present, extra-ocular motions intact bilaterally V,VII: smile symmetric, facial light touch sensation normal bilaterally VIII: hearing normal bilaterally IX,X: gag reflex present XI: bilateral shoulder shrug XII: midline tongue extension without atrophy or fasciculations Motor: Significant for mild weakness distal right upper extremity Tone and bulk:normal tone throughout; no atrophy noted Sensory: Pinprick and light touch intact throughout, bilaterally Deep Tendon Reflexes:  Right: Upper Extremity   Left: Upper extremity   biceps (C-5 to C-6) 2/4   biceps (C-5 to C-6) 2/4 tricep (C7) 2/4    triceps (C7) 2/4 Brachioradialis (C6) 2/4  Brachioradialis (C6) 2/4  Lower Extremity Lower Extremity  quadriceps (L-2 to L-4) 2/4   quadriceps (L-2 to L-4) 2/4 Achilles (S1) 2/4   Achilles (S1) 2/4  Plantars: Right: downgoing   Left: downgoing Cerebellar: normal finger-to-nose,  normal heel-to-shin test Gait:  No ataxia.  Lab Results: Lab Results  Component Value Date/Time   CHOL 77 07/13/2014 07:36 AM   Lipid Panel No results for input(s): CHOL, TRIG, HDL, CHOLHDL, VLDL, LDLCALC in the last 72 hours.  Studies/Results: Mr Virgel Paling Wo Contrast  09/09/2014   CLINICAL DATA:  Stroke.  Follow-up subdural hematomas  EXAM: MRI HEAD WITHOUT CONTRAST  MRA HEAD WITHOUT CONTRAST  TECHNIQUE: Multiplanar, multiecho pulse sequences of the brain and surrounding structures were obtained without intravenous contrast. Angiographic images of the head were obtained using MRA technique without contrast.   COMPARISON:  CT head 09/07/2014  FINDINGS: MRI HEAD FINDINGS  Subacute subdural hematomas are present bilaterally left greater than right. These are unchanged from the prior CT. Left-sided subdural hematoma measures approximately 11 mm in thickness with layering blood products. Small right frontal subdural hematoma unchanged. Small interhemispheric subdural hematoma unchanged  Mild midline shift to the right of approximately 3 mm is unchanged. Negative for hydrocephalus.  Mild chronic microvascular ischemic change in the white matter. Negative for acute infarct. Negative for mass lesion.  MRA HEAD FINDINGS  Both vertebral arteries are patent to the basilar. The basilar is widely patent. Superior cerebellar and posterior cerebral arteries are patent  Internal carotid artery widely patent bilaterally. Anterior and middle cerebral arteries widely patent without stenosis  Negative for cerebral aneurysm.  IMPRESSION: Subacute subdural hematomas bilaterally are stable, measuring up to 11 mm in thickness on the left. No new hemorrhage.  No acute infarct  Negative MRA head.   Electronically Signed   By: Franchot Gallo M.D.   On: 09/09/2014 13:16   Mr Brain Wo Contrast  09/09/2014   CLINICAL DATA:  Stroke.  Follow-up subdural hematomas  EXAM: MRI HEAD WITHOUT CONTRAST  MRA HEAD WITHOUT CONTRAST  TECHNIQUE: Multiplanar, multiecho pulse sequences of the brain and surrounding structures were obtained without intravenous contrast. Angiographic images of the head were obtained using MRA technique without contrast.  COMPARISON:  CT head 09/07/2014  FINDINGS: MRI HEAD FINDINGS  Subacute subdural hematomas are present bilaterally left greater than right. These are unchanged from the prior CT. Left-sided subdural hematoma measures approximately 11 mm in thickness with layering blood products. Small right frontal subdural hematoma unchanged. Small interhemispheric subdural hematoma unchanged  Mild midline shift to the right of  approximately 3 mm is unchanged. Negative for hydrocephalus.  Mild chronic microvascular ischemic change in the white matter. Negative for acute infarct. Negative for mass  lesion.  MRA HEAD FINDINGS  Both vertebral arteries are patent to the basilar. The basilar is widely patent. Superior cerebellar and posterior cerebral arteries are patent  Internal carotid artery widely patent bilaterally. Anterior and middle cerebral arteries widely patent without stenosis  Negative for cerebral aneurysm.  IMPRESSION: Subacute subdural hematomas bilaterally are stable, measuring up to 11 mm in thickness on the left. No new hemorrhage.  No acute infarct  Negative MRA head.   Electronically Signed   By: Franchot Gallo M.D.   On: 09/09/2014 13:16    MEDICATIONS                                                                                                                        Scheduled: . atorvastatin  80 mg Oral q1800  . levETIRAcetam  750 mg Oral BID  . levothyroxine  100 mcg Oral QAC breakfast  . pantoprazole  40 mg Oral Q supper  . senna-docusate  1 tablet Oral BID    ASSESSMENT/PLAN:                                                                                                            68 y/o with subacute bilateral SDH and episodic speech impairment who complains today of worsening right hand weakness and mild HA. MRI yesterday unchanged, no new intracranial abnormality.' Get follow up CT brain today. Continue keppra. Will follow up.  Dorian Pod, MD [facility] [phone]  09/10/2014, 9:57 AM

## 2014-09-11 ENCOUNTER — Inpatient Hospital Stay (HOSPITAL_COMMUNITY): Payer: Managed Care, Other (non HMO)

## 2014-09-11 LAB — GLUCOSE, CAPILLARY
GLUCOSE-CAPILLARY: 132 mg/dL — AB (ref 70–99)
GLUCOSE-CAPILLARY: 220 mg/dL — AB (ref 70–99)
Glucose-Capillary: 122 mg/dL — ABNORMAL HIGH (ref 70–99)
Glucose-Capillary: 134 mg/dL — ABNORMAL HIGH (ref 70–99)
Glucose-Capillary: 141 mg/dL — ABNORMAL HIGH (ref 70–99)

## 2014-09-11 MED ORDER — LEVETIRACETAM 500 MG PO TABS
1000.0000 mg | ORAL_TABLET | Freq: Two times a day (BID) | ORAL | Status: DC
  Administered 2014-09-11 – 2014-09-12 (×2): 1000 mg via ORAL
  Filled 2014-09-11 (×4): qty 2

## 2014-09-11 MED ORDER — LEVETIRACETAM IN NACL 500 MG/100ML IV SOLN
500.0000 mg | INTRAVENOUS | Status: AC
  Administered 2014-09-11: 500 mg via INTRAVENOUS
  Filled 2014-09-11: qty 100

## 2014-09-11 MED ORDER — DEXAMETHASONE SODIUM PHOSPHATE 4 MG/ML IJ SOLN
4.0000 mg | Freq: Four times a day (QID) | INTRAMUSCULAR | Status: DC
  Administered 2014-09-11 – 2014-09-15 (×14): 4 mg via INTRAVENOUS
  Filled 2014-09-11 (×22): qty 1

## 2014-09-11 NOTE — Progress Notes (Addendum)
NEURO HOSPITALIST PROGRESS NOTE   SUBJECTIVE:                                                                                                                        Mr. Kemppainen had an episode of right arm weakness, speech difficulty and right face tingling without alteration of consciousness lasting for about one hour this morning. STAT CT brain showed that left SDH is unchanged in size and midline shift remains the same. He just returned from CT and is having another event in which he is weak in the right arm and can not even use the right hand to eat his food.  On keppra 750 mg BID. Stated that he is concerned he could have MS or " something permanent".   OBJECTIVE:                                                                                                                           Vital signs in last 24 hours: Temp:  [97.7 F (36.5 C)-101.5 F (38.6 C)] 97.7 F (36.5 C) (11/29 1029) Pulse Rate:  [55-62] 62 (11/29 1025) Resp:  [16-22] 22 (11/29 1025) BP: (104-137)/(53-72) 137/72 mmHg (11/29 1025) SpO2:  [97 %-98 %] 97 % (11/29 1025)  Intake/Output from previous day:   Intake/Output this shift:   Nutritional status: Diet Carb Modified Diet - low sodium heart healthy  Past Medical History  Diagnosis Date  . Hyperlipidemia   . History of colon polyps   . History of diverticulitis   . Erectile dysfunction   . HYPERTENSION   . SDH (subdural hematoma)     Following syncopal episode  . SAH (subarachnoid hemorrhage)     Following syncopal episode  . Kidney stones     "passed it"  . Sleep apnea in adult   . Hypothyroidism   . Depression   . Type II diabetes mellitus   . Seizures     just once a few weeks ago" (05/11/2014)  Physical exam: pleasant male in no apparent distress. Head: normocephalic. Neck: supple, no bruits, no JVD. Cardiac: no murmurs. Lungs: clear. Abdomen: soft, no tender, no mass. Extremities: no  edema.  Neurologic Exam:  General: Mental Status: Alert, oriented, thought content appropriate.Mild  dysarthria without evidence of aphasia. Able to follow 3 step commands without difficulty. Cranial Nerves: II: Discs flat bilaterally; Visual fields grossly normal, pupils equal, round, reactive to light and accommodation III,IV, VI: ptosis not present, extra-ocular motions intact bilaterally V,VII: smile symmetric, facial light touch sensation normal bilaterally VIII: hearing normal bilaterally IX,X: gag reflex present XI: bilateral shoulder shrug XII: midline tongue extension without atrophy or fasciculations Motor: Significant for  weakness right upper extremity Tone and bulk:normal tone throughout; no atrophy noted Sensory: Pinprick and light touch intact throughout, bilaterally Deep Tendon Reflexes:  Right: Upper Extremity Left: Upper extremity   biceps (C-5 to C-6) 2/4 biceps (C-5 to C-6) 2/4 tricep (C7) 2/4triceps (C7) 2/4 Brachioradialis (C6) 2/4Brachioradialis (C6) 2/4  Lower Extremity Lower Extremity  quadriceps (L-2 to L-4) 2/4 quadriceps (L-2 to L-4) 2/4 Achilles (S1) 2/4Achilles (S1) 2/4  Plantars: Right: downgoingLeft: downgoing Cerebellar: normal finger-to-nose, normal heel-to-shin test Gait:  No ataxia.   Lab Results: Lab Results  Component Value Date/Time   CHOL 77 07/13/2014 07:36 AM   Lipid Panel No results for input(s): CHOL, TRIG, HDL, CHOLHDL, VLDL, LDLCALC in the last 72 hours.  Studies/Results: Ct Head Wo Contrast  09/11/2014   CLINICAL DATA:  Followup subdural hematoma.  EXAM: CT HEAD WITHOUT CONTRAST  TECHNIQUE: Contiguous axial images were obtained from the base of the skull through the vertex without intravenous contrast.  COMPARISON:  CT 09/10/2014   FINDINGS: Left-sided subdural hematoma is unchanged measuring approximately 11 mm in thickness. Continued evolution with decreased density in the subdural hematoma compared with prior studies. No new area of hemorrhage. Small area of high density subdural hematoma in the left temporal lobe anteriorly unchanged. Small right frontal subdural hematoma unchanged. Interhemispheric small subdural hematoma and hygroma unchanged.  Midline shift to the right measures 6 mm and is unchanged  No new area of hemorrhage or infarction.  Negative for mass lesion.  IMPRESSION: Bilateral subdural hematomas are stable in size. Continued liquefaction of left-sided subdural hematoma. 6 mm midline shift to the right is stable.   Electronically Signed   By: Franchot Gallo M.D.   On: 09/11/2014 11:43   Ct Head Wo Contrast  09/10/2014   CLINICAL DATA:  Followup subdural hematoma. Worsening right hand weakness.  EXAM: CT HEAD WITHOUT CONTRAST  TECHNIQUE: Contiguous axial images were obtained from the base of the skull through the vertex without intravenous contrast.  COMPARISON:  CT head 09/07/2014  FINDINGS: Mixed density subdural hematoma on the left appears slightly larger compared with the prior study. This measures 10.7 mm in the frontal area and 9 mm over the convexity region. Anterior component is hypodense, posterior layering blood products or hyperdense as noted previously. No new hemorrhage. Small left temporal subdural hematoma unchanged.  Small right frontal subdural hematoma unchanged. Interhemispheric subdural hematoma and hygroma slightly improved.  No new hemorrhage. No acute infarct. Ventricle size normal. Slight increase in midline shift to the right now measuring 5.5 mm compared with 4.4 mm previously.  IMPRESSION: Left-sided subdural hematoma slightly larger. No definite repeat hemorrhage is identified. 1 mm more of midline shift to the right compared with the prior study.  Small right frontal subdural hematoma  unchanged. Interhemispheric subdural hematoma slightly improved.   Electronically Signed   By: Franchot Gallo M.D.   On: 09/10/2014 11:27   Mr Jodene Nam Head Wo Contrast  09/09/2014   CLINICAL DATA:  Stroke.  Follow-up subdural hematomas  EXAM: MRI HEAD WITHOUT CONTRAST  MRA HEAD WITHOUT CONTRAST  TECHNIQUE: Multiplanar, multiecho pulse sequences of the brain and surrounding structures were obtained without intravenous contrast. Angiographic images of the head were obtained using MRA technique without contrast.  COMPARISON:  CT head 09/07/2014  FINDINGS: MRI HEAD FINDINGS  Subacute subdural hematomas are present bilaterally left greater than right. These are unchanged from the prior CT. Left-sided subdural hematoma measures approximately 11 mm in thickness with layering blood products. Small right frontal subdural hematoma unchanged. Small interhemispheric subdural hematoma unchanged  Mild midline shift to the right of approximately 3 mm is unchanged. Negative for hydrocephalus.  Mild chronic microvascular ischemic change in the white matter. Negative for acute infarct. Negative for mass lesion.  MRA HEAD FINDINGS  Both vertebral arteries are patent to the basilar. The basilar is widely patent. Superior cerebellar and posterior cerebral arteries are patent  Internal carotid artery widely patent bilaterally. Anterior and middle cerebral arteries widely patent without stenosis  Negative for cerebral aneurysm.  IMPRESSION: Subacute subdural hematomas bilaterally are stable, measuring up to 11 mm in thickness on the left. No new hemorrhage.  No acute infarct  Negative MRA head.   Electronically Signed   By: Franchot Gallo M.D.   On: 09/09/2014 13:16   Mr Brain Wo Contrast  09/09/2014   CLINICAL DATA:  Stroke.  Follow-up subdural hematomas  EXAM: MRI HEAD WITHOUT CONTRAST  MRA HEAD WITHOUT CONTRAST  TECHNIQUE: Multiplanar, multiecho pulse sequences of the brain and surrounding structures were obtained without  intravenous contrast. Angiographic images of the head were obtained using MRA technique without contrast.  COMPARISON:  CT head 09/07/2014  FINDINGS: MRI HEAD FINDINGS  Subacute subdural hematomas are present bilaterally left greater than right. These are unchanged from the prior CT. Left-sided subdural hematoma measures approximately 11 mm in thickness with layering blood products. Small right frontal subdural hematoma unchanged. Small interhemispheric subdural hematoma unchanged  Mild midline shift to the right of approximately 3 mm is unchanged. Negative for hydrocephalus.  Mild chronic microvascular ischemic change in the white matter. Negative for acute infarct. Negative for mass lesion.  MRA HEAD FINDINGS  Both vertebral arteries are patent to the basilar. The basilar is widely patent. Superior cerebellar and posterior cerebral arteries are patent  Internal carotid artery widely patent bilaterally. Anterior and middle cerebral arteries widely patent without stenosis  Negative for cerebral aneurysm.  IMPRESSION: Subacute subdural hematomas bilaterally are stable, measuring up to 11 mm in thickness on the left. No new hemorrhage.  No acute infarct  Negative MRA head.   Electronically Signed   By: Franchot Gallo M.D.   On: 09/09/2014 13:16    MEDICATIONS                                                                                                                        Scheduled: . atorvastatin  80 mg Oral q1800  . levETIRAcetam  500 mg Intravenous STAT  . levETIRAcetam  1,000 mg Oral BID  . levothyroxine  100 mcg Oral QAC  breakfast  . pantoprazole  40 mg Oral Q supper  . senna-docusate  1 tablet Oral BID    ASSESSMENT/PLAN:                                                                                                           68 y/o with subacute bilateral SDH. Remains experiencing episodes of right arm weakness with slurred speech that can last up to one hour. Repeat CT demonstrated  unchanged significant left convexity mixed density SDH that doesn't appear to be causing mass effect . ?? Sub-optimally controlled right focal seizure manifesting with ictal atonia right arm; however very prolonged duration of the event is atypical for a seizure. Will give additional 500 mg IV keppra as a loading dose now, and also increase the daily maintenance dose to 1 gram BID. May require prolonged EEG to catch these events and search for EEG correlate (although clinical phenomenon without associated EEG correlate is not unusual in focal seizures).  Will continue to follow.  Dorian Pod, MD [facility] [phone]  09/11/2014, 12:08 PM

## 2014-09-11 NOTE — Progress Notes (Signed)
Patient with transient worsening of his speech and right distal upper extremity function earlier today. Follow-up head CT scan unchanged.  Currently without headache. Minimal movement of his right hand. Speech is improved but has some residual dysarthria.  Patient with a significant left convexity mixed density subdural hematoma with mass effect. Previously it was hoped this could be managed nonoperatively. I would add steroids for cortical irritation. Dr. Vertell Limber to assess tomorrow with regard to possible need for surgical evacuation.

## 2014-09-12 ENCOUNTER — Inpatient Hospital Stay (HOSPITAL_COMMUNITY): Payer: Managed Care, Other (non HMO) | Admitting: Certified Registered"

## 2014-09-12 ENCOUNTER — Encounter (HOSPITAL_COMMUNITY): Payer: Self-pay

## 2014-09-12 ENCOUNTER — Encounter (HOSPITAL_COMMUNITY): Payer: Self-pay | Admitting: Anesthesiology

## 2014-09-12 ENCOUNTER — Encounter (HOSPITAL_COMMUNITY)
Admission: EM | Disposition: A | Discharge status: Home / Self Care | Payer: Self-pay | Source: Home / Self Care | Attending: Neurology

## 2014-09-12 DIAGNOSIS — S065X9A Traumatic subdural hemorrhage with loss of consciousness of unspecified duration, initial encounter: Secondary | ICD-10-CM | POA: Diagnosis present

## 2014-09-12 DIAGNOSIS — S065XAA Traumatic subdural hemorrhage with loss of consciousness status unknown, initial encounter: Secondary | ICD-10-CM | POA: Diagnosis present

## 2014-09-12 HISTORY — PX: CRANIOTOMY: SHX93

## 2014-09-12 LAB — SURGICAL PCR SCREEN
MRSA, PCR: NEGATIVE
STAPHYLOCOCCUS AUREUS: POSITIVE — AB

## 2014-09-12 LAB — GLUCOSE, CAPILLARY
GLUCOSE-CAPILLARY: 175 mg/dL — AB (ref 70–99)
GLUCOSE-CAPILLARY: 146 mg/dL — AB (ref 70–99)
Glucose-Capillary: 189 mg/dL — ABNORMAL HIGH (ref 70–99)
Glucose-Capillary: 200 mg/dL — ABNORMAL HIGH (ref 70–99)
Glucose-Capillary: 232 mg/dL — ABNORMAL HIGH (ref 70–99)

## 2014-09-12 SURGERY — CRANIOTOMY HEMATOMA EVACUATION SUBDURAL
Anesthesia: General | Laterality: Left

## 2014-09-12 MED ORDER — LEVETIRACETAM IN NACL 500 MG/100ML IV SOLN
500.0000 mg | Freq: Two times a day (BID) | INTRAVENOUS | Status: DC
  Filled 2014-09-12 (×2): qty 100

## 2014-09-12 MED ORDER — PANTOPRAZOLE SODIUM 40 MG IV SOLR
40.0000 mg | Freq: Every day | INTRAVENOUS | Status: DC
  Administered 2014-09-12 – 2014-09-14 (×3): 40 mg via INTRAVENOUS
  Filled 2014-09-12 (×5): qty 40

## 2014-09-12 MED ORDER — PROMETHAZINE HCL 25 MG PO TABS: 12.5000 mg | ORAL_TABLET | ORAL | Status: DC | PRN

## 2014-09-12 MED ORDER — ACETAMINOPHEN 325 MG PO TABS
650.0000 mg | ORAL_TABLET | ORAL | Status: DC | PRN
  Administered 2014-09-13 (×2): 650 mg via ORAL

## 2014-09-12 MED ORDER — LIDOCAINE-EPINEPHRINE 1 %-1:100000 IJ SOLN
INTRAMUSCULAR | Status: DC | PRN
  Administered 2014-09-12: 3.5 mL

## 2014-09-12 MED ORDER — NITROGLYCERIN 0.4 MG SL SUBL: 0.4000 mg | SUBLINGUAL_TABLET | SUBLINGUAL | Status: DC | PRN

## 2014-09-12 MED ORDER — POTASSIUM CHLORIDE IN NACL 20-0.9 MEQ/L-% IV SOLN
INTRAVENOUS | Status: DC
  Administered 2014-09-12: 19:00:00 via INTRAVENOUS
  Filled 2014-09-12 (×3): qty 1000

## 2014-09-12 MED ORDER — ROCURONIUM BROMIDE 50 MG/5ML IV SOLN
INTRAVENOUS | Status: AC
  Filled 2014-09-12: qty 1

## 2014-09-12 MED ORDER — LEVETIRACETAM IN NACL 500 MG/100ML IV SOLN: 500.0000 mg | Freq: Two times a day (BID) | INTRAVENOUS | Status: DC

## 2014-09-12 MED ORDER — SODIUM CHLORIDE 0.9 % IJ SOLN
INTRAMUSCULAR | Status: AC
  Filled 2014-09-12: qty 10

## 2014-09-12 MED ORDER — DOCUSATE SODIUM 100 MG PO CAPS
100.0000 mg | ORAL_CAPSULE | Freq: Two times a day (BID) | ORAL | Status: DC
  Administered 2014-09-14 – 2014-09-15 (×3): 100 mg via ORAL
  Filled 2014-09-12 (×9): qty 1

## 2014-09-12 MED ORDER — PNEUMOCOCCAL 13-VAL CONJ VACC IM SUSP
0.5000 mL | INTRAMUSCULAR | Status: DC
Start: 2014-09-13 — End: 2014-09-16
  Filled 2014-09-12: qty 0.5

## 2014-09-12 MED ORDER — PROPOFOL 10 MG/ML IV BOLUS
INTRAVENOUS | Status: AC
  Filled 2014-09-12: qty 20

## 2014-09-12 MED ORDER — ROCURONIUM BROMIDE 100 MG/10ML IV SOLN
INTRAVENOUS | Status: DC | PRN
  Administered 2014-09-12: 50 mg via INTRAVENOUS

## 2014-09-12 MED ORDER — ASPIRIN EC 81 MG PO TBEC
81.0000 mg | DELAYED_RELEASE_TABLET | Freq: Every day | ORAL | Status: DC
  Filled 2014-09-12 (×2): qty 1

## 2014-09-12 MED ORDER — TICAGRELOR 90 MG PO TABS
90.0000 mg | ORAL_TABLET | Freq: Two times a day (BID) | ORAL | Status: DC
  Filled 2014-09-12 (×3): qty 1

## 2014-09-12 MED ORDER — BACITRACIN ZINC 500 UNIT/GM EX OINT
TOPICAL_OINTMENT | CUTANEOUS | Status: DC | PRN
Start: 2014-09-12 — End: 2014-09-12
  Administered 2014-09-12: 1 via TOPICAL

## 2014-09-12 MED ORDER — FLEET ENEMA 7-19 GM/118ML RE ENEM
1.0000 | ENEMA | Freq: Once | RECTAL | Status: AC | PRN
  Filled 2014-09-12: qty 1

## 2014-09-12 MED ORDER — CEFAZOLIN SODIUM-DEXTROSE 2-3 GM-% IV SOLR
2.0000 g | Freq: Three times a day (TID) | INTRAVENOUS | Status: AC
  Administered 2014-09-12 – 2014-09-13 (×2): 2 g via INTRAVENOUS
  Filled 2014-09-12 (×2): qty 50

## 2014-09-12 MED ORDER — GLYCOPYRROLATE 0.2 MG/ML IJ SOLN
INTRAMUSCULAR | Status: DC | PRN
  Administered 2014-09-12: 0.6 mg via INTRAVENOUS

## 2014-09-12 MED ORDER — OXYCODONE HCL 5 MG/5ML PO SOLN: 5.0000 mg | Freq: Once | ORAL | Status: DC | PRN

## 2014-09-12 MED ORDER — ONDANSETRON HCL 4 MG/2ML IJ SOLN: 4.0000 mg | INTRAMUSCULAR | Status: DC | PRN

## 2014-09-12 MED ORDER — METFORMIN HCL 500 MG PO TABS
1000.0000 mg | ORAL_TABLET | Freq: Two times a day (BID) | ORAL | Status: DC
  Administered 2014-09-12 – 2014-09-16 (×8): 1000 mg via ORAL
  Filled 2014-09-12 (×10): qty 2

## 2014-09-12 MED ORDER — SUCCINYLCHOLINE CHLORIDE 20 MG/ML IJ SOLN
INTRAMUSCULAR | Status: AC
  Filled 2014-09-12: qty 1

## 2014-09-12 MED ORDER — FENTANYL CITRATE 0.05 MG/ML IJ SOLN
INTRAMUSCULAR | Status: AC
  Filled 2014-09-12: qty 5

## 2014-09-12 MED ORDER — NEOSTIGMINE METHYLSULFATE 10 MG/10ML IV SOLN
INTRAVENOUS | Status: DC | PRN
  Administered 2014-09-12: 4 mg via INTRAVENOUS

## 2014-09-12 MED ORDER — GLYCOPYRROLATE 0.2 MG/ML IJ SOLN
INTRAMUSCULAR | Status: AC
  Filled 2014-09-12: qty 3

## 2014-09-12 MED ORDER — CEFAZOLIN SODIUM-DEXTROSE 2-3 GM-% IV SOLR
INTRAVENOUS | Status: DC | PRN
  Administered 2014-09-12: 2 g via INTRAVENOUS

## 2014-09-12 MED ORDER — ACETAMINOPHEN 650 MG RE SUPP: 650.0000 mg | RECTAL | Status: DC | PRN

## 2014-09-12 MED ORDER — LISINOPRIL 2.5 MG PO TABS
2.5000 mg | ORAL_TABLET | Freq: Every day | ORAL | Status: DC
  Administered 2014-09-12 – 2014-09-16 (×4): 2.5 mg via ORAL
  Filled 2014-09-12 (×5): qty 1

## 2014-09-12 MED ORDER — LIDOCAINE HCL (CARDIAC) 20 MG/ML IV SOLN
INTRAVENOUS | Status: DC | PRN
  Administered 2014-09-12: 80 mg via INTRAVENOUS

## 2014-09-12 MED ORDER — EPHEDRINE SULFATE 50 MG/ML IJ SOLN
INTRAMUSCULAR | Status: AC
  Filled 2014-09-12: qty 1

## 2014-09-12 MED ORDER — FENTANYL CITRATE 0.05 MG/ML IJ SOLN
INTRAMUSCULAR | Status: DC | PRN
  Administered 2014-09-12: 100 ug via INTRAVENOUS
  Administered 2014-09-12 (×3): 50 ug via INTRAVENOUS

## 2014-09-12 MED ORDER — SENNA 8.6 MG PO TABS
1.0000 | ORAL_TABLET | Freq: Two times a day (BID) | ORAL | Status: DC
  Administered 2014-09-14 – 2014-09-15 (×3): 8.6 mg via ORAL
  Filled 2014-09-12 (×9): qty 1

## 2014-09-12 MED ORDER — LINAGLIPTIN 5 MG PO TABS
5.0000 mg | ORAL_TABLET | Freq: Every day | ORAL | Status: DC
  Administered 2014-09-12 – 2014-09-16 (×5): 5 mg via ORAL
  Filled 2014-09-12 (×5): qty 1

## 2014-09-12 MED ORDER — ONDANSETRON HCL 4 MG/2ML IJ SOLN
INTRAMUSCULAR | Status: DC | PRN
  Administered 2014-09-12: 4 mg via INTRAVENOUS

## 2014-09-12 MED ORDER — ONDANSETRON HCL 4 MG PO TABS: 4.0000 mg | ORAL_TABLET | ORAL | Status: DC | PRN

## 2014-09-12 MED ORDER — LACTATED RINGERS IV SOLN
INTRAVENOUS | Status: DC | PRN
  Administered 2014-09-12: 15:00:00 via INTRAVENOUS

## 2014-09-12 MED ORDER — FENTANYL CITRATE 0.05 MG/ML IJ SOLN
INTRAMUSCULAR | Status: AC
  Filled 2014-09-12: qty 2

## 2014-09-12 MED ORDER — ARTIFICIAL TEARS OP OINT
TOPICAL_OINTMENT | OPHTHALMIC | Status: AC
  Filled 2014-09-12: qty 3.5

## 2014-09-12 MED ORDER — NEOSTIGMINE METHYLSULFATE 10 MG/10ML IV SOLN
INTRAVENOUS | Status: AC
  Filled 2014-09-12: qty 2

## 2014-09-12 MED ORDER — ONDANSETRON HCL 4 MG/2ML IJ SOLN
INTRAMUSCULAR | Status: AC
  Filled 2014-09-12: qty 2

## 2014-09-12 MED ORDER — ONDANSETRON HCL 4 MG/2ML IJ SOLN: 4.0000 mg | Freq: Four times a day (QID) | INTRAMUSCULAR | Status: DC | PRN

## 2014-09-12 MED ORDER — LIDOCAINE HCL (CARDIAC) 20 MG/ML IV SOLN
INTRAVENOUS | Status: AC
  Filled 2014-09-12: qty 5

## 2014-09-12 MED ORDER — MORPHINE SULFATE 2 MG/ML IJ SOLN: 1.0000 mg | INTRAMUSCULAR | Status: DC | PRN

## 2014-09-12 MED ORDER — THROMBIN 20000 UNITS EX SOLR
CUTANEOUS | Status: DC | PRN
  Administered 2014-09-12: 16:00:00 via TOPICAL

## 2014-09-12 MED ORDER — DEXAMETHASONE SODIUM PHOSPHATE 4 MG/ML IJ SOLN
INTRAMUSCULAR | Status: DC | PRN
  Administered 2014-09-12: 8 mg via INTRAVENOUS

## 2014-09-12 MED ORDER — 0.9 % SODIUM CHLORIDE (POUR BTL) OPTIME
TOPICAL | Status: DC | PRN
  Administered 2014-09-12 (×2): 1000 mL

## 2014-09-12 MED ORDER — PROPOFOL 10 MG/ML IV BOLUS
INTRAVENOUS | Status: DC | PRN
  Administered 2014-09-12: 40 mg via INTRAVENOUS
  Administered 2014-09-12: 120 mg via INTRAVENOUS

## 2014-09-12 MED ORDER — BISACODYL 10 MG RE SUPP: 10.0000 mg | Freq: Every day | RECTAL | Status: DC | PRN

## 2014-09-12 MED ORDER — POLYETHYLENE GLYCOL 3350 17 G PO PACK
17.0000 g | PACK | Freq: Every day | ORAL | Status: DC | PRN
  Filled 2014-09-12: qty 1

## 2014-09-12 MED ORDER — DEXAMETHASONE SODIUM PHOSPHATE 4 MG/ML IJ SOLN
INTRAMUSCULAR | Status: AC
  Filled 2014-09-12: qty 2

## 2014-09-12 MED ORDER — PIOGLITAZONE HCL 45 MG PO TABS
45.0000 mg | ORAL_TABLET | Freq: Every day | ORAL | Status: DC
  Administered 2014-09-12 – 2014-09-16 (×5): 45 mg via ORAL
  Filled 2014-09-12 (×5): qty 1

## 2014-09-12 MED ORDER — BUPIVACAINE HCL (PF) 0.25 % IJ SOLN
INTRAMUSCULAR | Status: DC | PRN
  Administered 2014-09-12: 3.5 mL

## 2014-09-12 MED ORDER — LABETALOL HCL 5 MG/ML IV SOLN: 10.0000 mg | INTRAVENOUS | Status: DC | PRN

## 2014-09-12 MED ORDER — OXYCODONE HCL 5 MG PO TABS: 5.0000 mg | ORAL_TABLET | Freq: Once | ORAL | Status: DC | PRN

## 2014-09-12 MED ORDER — STERILE WATER FOR INJECTION IJ SOLN
INTRAMUSCULAR | Status: AC
  Filled 2014-09-12: qty 10

## 2014-09-12 MED ORDER — HYDROCODONE-ACETAMINOPHEN 5-325 MG PO TABS: 1.0000 | ORAL_TABLET | ORAL | Status: DC | PRN

## 2014-09-12 MED ORDER — FENTANYL CITRATE 0.05 MG/ML IJ SOLN
25.0000 ug | INTRAMUSCULAR | Status: DC | PRN
  Administered 2014-09-12: 25 ug via INTRAVENOUS

## 2014-09-12 SURGICAL SUPPLY — 77 items
BANDAGE GAUZE 4  KLING STR (GAUZE/BANDAGES/DRESSINGS) IMPLANT
BENZOIN TINCTURE PRP APPL 2/3 (GAUZE/BANDAGES/DRESSINGS) IMPLANT
BIT DRILL WIRE PASS 1.3MM (BIT) IMPLANT
BNDG GAUZE ELAST 4 BULKY (GAUZE/BANDAGES/DRESSINGS) IMPLANT
BRUSH SCRUB EZ 1% IODOPHOR (MISCELLANEOUS) ×2 IMPLANT
BRUSH SCRUB EZ PLAIN DRY (MISCELLANEOUS) ×2 IMPLANT
BUR ACORN 6.0 PRECISION (BURR) ×2 IMPLANT
BUR ROUTER D-58 CRANI (BURR) IMPLANT
CANISTER SUCT 3000ML (MISCELLANEOUS) ×2 IMPLANT
CATH ROBINSON RED A/P 12FR (CATHETERS) IMPLANT
CLIP TI MEDIUM 6 (CLIP) IMPLANT
CONT SPEC 4OZ CLIKSEAL STRL BL (MISCELLANEOUS) ×2 IMPLANT
DECANTER SPIKE VIAL GLASS SM (MISCELLANEOUS) ×2 IMPLANT
DRAIN JACKSON PRATT 10MM FLAT (MISCELLANEOUS) ×2 IMPLANT
DRAIN PENROSE 1/2X12 LTX STRL (WOUND CARE) IMPLANT
DRAIN SNY WOU 7FLT (WOUND CARE) IMPLANT
DRAPE NEUROLOGICAL W/INCISE (DRAPES) ×2 IMPLANT
DRAPE SURG IRRIG POUCH 19X23 (DRAPES) IMPLANT
DRAPE WARM FLUID 44X44 (DRAPE) ×2 IMPLANT
DRILL WIRE PASS 1.3MM (BIT)
DRSG OPSITE 4X5.5 SM (GAUZE/BANDAGES/DRESSINGS) ×2 IMPLANT
DRSG PAD ABDOMINAL 8X10 ST (GAUZE/BANDAGES/DRESSINGS) IMPLANT
DRSG TELFA 3X8 NADH (GAUZE/BANDAGES/DRESSINGS) ×2 IMPLANT
DURAPREP 6ML APPLICATOR 50/CS (WOUND CARE) ×2 IMPLANT
ELECT CAUTERY BLADE 6.4 (BLADE) ×2 IMPLANT
ELECT REM PT RETURN 9FT ADLT (ELECTROSURGICAL) ×2
ELECTRODE REM PT RTRN 9FT ADLT (ELECTROSURGICAL) ×1 IMPLANT
EVACUATOR SILICONE 100CC (DRAIN) ×2 IMPLANT
GAUZE SPONGE 4X4 12PLY STRL (GAUZE/BANDAGES/DRESSINGS) IMPLANT
GAUZE SPONGE 4X4 16PLY XRAY LF (GAUZE/BANDAGES/DRESSINGS) IMPLANT
GLOVE BIO SURGEON STRL SZ8 (GLOVE) ×2 IMPLANT
GLOVE BIOGEL PI IND STRL 8 (GLOVE) ×1 IMPLANT
GLOVE BIOGEL PI IND STRL 8.5 (GLOVE) ×1 IMPLANT
GLOVE BIOGEL PI INDICATOR 8 (GLOVE) ×1
GLOVE BIOGEL PI INDICATOR 8.5 (GLOVE) ×1
GLOVE ECLIPSE 7.5 STRL STRAW (GLOVE) ×2 IMPLANT
GLOVE EXAM NITRILE LRG STRL (GLOVE) IMPLANT
GLOVE EXAM NITRILE MD LF STRL (GLOVE) IMPLANT
GLOVE EXAM NITRILE XL STR (GLOVE) IMPLANT
GLOVE EXAM NITRILE XS STR PU (GLOVE) IMPLANT
GOWN STRL REUS W/ TWL LRG LVL3 (GOWN DISPOSABLE) IMPLANT
GOWN STRL REUS W/ TWL XL LVL3 (GOWN DISPOSABLE) IMPLANT
GOWN STRL REUS W/TWL 2XL LVL3 (GOWN DISPOSABLE) IMPLANT
GOWN STRL REUS W/TWL LRG LVL3 (GOWN DISPOSABLE)
GOWN STRL REUS W/TWL XL LVL3 (GOWN DISPOSABLE)
HEMOSTAT SURGICEL 2X14 (HEMOSTASIS) ×2 IMPLANT
KIT BASIN OR (CUSTOM PROCEDURE TRAY) ×2 IMPLANT
KIT ROOM TURNOVER OR (KITS) ×2 IMPLANT
NEEDLE HYPO 25X1 1.5 SAFETY (NEEDLE) ×2 IMPLANT
NS IRRIG 1000ML POUR BTL (IV SOLUTION) ×2 IMPLANT
PACK CRANIOTOMY (CUSTOM PROCEDURE TRAY) ×2 IMPLANT
PAD ARMBOARD 7.5X6 YLW CONV (MISCELLANEOUS) ×2 IMPLANT
PATTIES SURGICAL .5 X.5 (GAUZE/BANDAGES/DRESSINGS) IMPLANT
PATTIES SURGICAL .5 X3 (DISPOSABLE) IMPLANT
PATTIES SURGICAL 1X1 (DISPOSABLE) IMPLANT
PIN MAYFIELD SKULL DISP (PIN) IMPLANT
PLATE 1.5  2HOLE LNG NEURO (Plate) ×2 IMPLANT
PLATE 1.5  2HOLE MED NEURO (Plate) ×1 IMPLANT
PLATE 1.5 2HOLE LNG NEURO (Plate) ×2 IMPLANT
PLATE 1.5 2HOLE MED NEURO (Plate) ×1 IMPLANT
SCREW SELF DRILL HT 1.5/4MM (Screw) ×12 IMPLANT
SPECIMEN JAR SMALL (MISCELLANEOUS) IMPLANT
SPONGE NEURO XRAY DETECT 1X3 (DISPOSABLE) IMPLANT
SPONGE SURGIFOAM ABS GEL 12-7 (HEMOSTASIS) IMPLANT
STAPLER SKIN PROX WIDE 3.9 (STAPLE) ×2 IMPLANT
SUT ETHILON 3 0 FSL (SUTURE) ×2 IMPLANT
SUT ETHILON 3 0 PS 1 (SUTURE) IMPLANT
SUT NURALON 4 0 TR CR/8 (SUTURE) ×4 IMPLANT
SUT VIC AB 2-0 CP2 18 (SUTURE) ×4 IMPLANT
SUT VIC AB 3-0 SH 8-18 (SUTURE) IMPLANT
SYR CONTROL 10ML LL (SYRINGE) ×2 IMPLANT
TOWEL OR 17X24 6PK STRL BLUE (TOWEL DISPOSABLE) ×2 IMPLANT
TOWEL OR 17X26 10 PK STRL BLUE (TOWEL DISPOSABLE) ×2 IMPLANT
TRAP SPECIMEN MUCOUS 40CC (MISCELLANEOUS) IMPLANT
TRAY FOLEY CATH 14FRSI W/METER (CATHETERS) IMPLANT
UNDERPAD 30X30 INCONTINENT (UNDERPADS AND DIAPERS) IMPLANT
WATER STERILE IRR 1000ML POUR (IV SOLUTION) ×2 IMPLANT

## 2014-09-12 NOTE — Progress Notes (Signed)
Attempted twice to do PCR nasal swab-patient not in room and ambulating around hospital.  Will try to do as soon as he returns.  Kizzie Bane, RN

## 2014-09-12 NOTE — Progress Notes (Signed)
      PROGRESS NOTE  Subjective:   This 68 year old male has a history of coronary artery disease. He presented with a subarachnoid hemorrhage and subdural hematoma in June following an injury on the roof. At the time he was found to have a cardiomyopathy with a low ejection fraction. He had his subdural treated and eventually had cardiac catheterization the end of July that showed moderate disease in the LAD and moderate to severe circumflex disease with a tight right coronary artery stenosis. At the time he had placement of a 3.5 x 20 mm drug-eluting stent to the right coronary artery that was dilated to 4.0 mm and he was placed on dual antiplatelet therapy with aspirin and BRILINTA. He had done well and had completed cardiac rehabilitation. His left ventricular function improved and his last EF was 50-55%. He noted that he slipped and had a fall about a week ago but did not have syncope. He recalls being struck in the head at a basketball game.He presented with speech difficulties and some transient aphasia and was found to have subdural hematomas that have been managed with observation; however, symptoms have worsened and surgical evacuation is planned for later today. He presently denies CP or dyspnea.  Objective:    Vital Signs:   Temp:  [97.7 F (36.5 C)-98.2 F (36.8 C)] 98 F (36.7 C) (11/30 0505) Pulse Rate:  [62-77] 68 (11/30 0505) Resp:  [17-22] 18 (11/30 0505) BP: (109-137)/(65-72) 130/72 mmHg (11/30 0505) SpO2:  [92 %-98 %] 98 % (11/30 0505)  Last BM Date: 09/11/14   24-hour weight change: Weight change:   Weight trends: Filed Weights   09/06/14 2310  Weight: 225 lb 8.5 oz (102.3 kg)    Intake/Output:        Physical Exam: BP 130/72 mmHg  Pulse 68  Temp(Src) 98 F (36.7 C) (Oral)  Resp 18  Ht 6\' 1"  (1.854 m)  Wt 225 lb 8.5 oz (102.3 kg)  BMI 29.76 kg/m2  SpO2 98%  Wt Readings from Last 3 Encounters:  09/06/14 225 lb 8.5 oz (102.3 kg)  07/28/14  234 lb 12.8 oz (106.505 kg)  07/26/14 235 lb 8 oz (106.822 kg)    General: Vital signs reviewed and noted.   Head: Normocephalic, atraumatic.        Neck:  supple  Lungs:     Clear   Heart:   RRR  Abdomen:  Soft, non-tender, non-distended    Extremities: No edema    Neurologic: Dysarthria and weakness RUE        Medications:    Infusions: . sodium chloride 1,000 mL (09/08/14 0504)    Scheduled Medications: . atorvastatin  80 mg Oral q1800  . dexamethasone  4 mg Intravenous 4 times per day  . levETIRAcetam  1,000 mg Oral BID  . levothyroxine  100 mcg Oral QAC breakfast  . pantoprazole  40 mg Oral Q supper  . senna-docusate  1 tablet Oral BID    Assessment/ Plan:   Active Problems:   Diabetes mellitus type 2, noninsulin dependent   Hypertensive heart disease   Seizure   Coronary atherosclerosis of native coronary artery   SDH (subdural hematoma)  1. CAD - s/p stenting with a DES 4 months ago. Brilinta has been stopped. Would resume aspirin when ok with neurosurgery.  2. SAH/SDH-for evacuation today; management per neurosurgery.  3. Hyperlipidemia-continue statin.  Kirk Ruths, MD, [facility]: Lambert Rhonda W 09/12/2014, 9:23 AM

## 2014-09-12 NOTE — Anesthesia Procedure Notes (Signed)
Procedure Name: Intubation Date/Time: 09/12/2014 3:32 PM Performed by: Maude Leriche D Pre-anesthesia Checklist: Patient identified, Emergency Drugs available, Suction available, Patient being monitored and Timeout performed Patient Re-evaluated:Patient Re-evaluated prior to inductionOxygen Delivery Method: Circle system utilized Preoxygenation: Pre-oxygenation with 100% oxygen Intubation Type: IV induction Ventilation: Mask ventilation without difficulty and Oral airway inserted - appropriate to patient size Laryngoscope Size: Miller and 2 Grade View: Grade I Tube type: Oral Tube size: 8.0 mm Number of attempts: 1 Airway Equipment and Method: Stylet Placement Confirmation: ETT inserted through vocal cords under direct vision,  positive ETCO2 and breath sounds checked- equal and bilateral Secured at: 23 cm Tube secured with: Tape Dental Injury: Teeth and Oropharynx as per pre-operative assessment

## 2014-09-12 NOTE — Op Note (Signed)
09/06/2014 - 09/12/2014  4:23 PM  PATIENT:  Allen Myers  68 y.o. male  PRE-OPERATIVE DIAGNOSIS:  Left Subdural Hematoma with aphasia and hemiparesis  POST-OPERATIVE DIAGNOSIS:  Left Subdural Hematoma with aphasia and hemiparesis  PROCEDURE:  Procedure(s): CRANIOTOMY HEMATOMA EVACUATION SUBDURAL (Left)  SURGEON:  Surgeon(s) and Role:    * Erline Levine, MD - Primary  PHYSICIAN ASSISTANT: Arnoldo Morale  ASSISTANTS: Poteat, RN   ANESTHESIA:   general  EBL:  Total I/O In: 700 [I.V.:700] Out: 75 [Blood:75]  BLOOD ADMINISTERED:none  DRAINS: (10) Jackson-Pratt drain(s) with closed bulb suction in the subdural space   LOCAL MEDICATIONS USED:  LIDOCAINE   SPECIMEN:  No Specimen  DISPOSITION OF SPECIMEN:  N/A  COUNTS:  YES  TOURNIQUET:  * No tourniquets in log *  DICTATION: Patient is 68 year old man who fell and struck his head and developed a left subdural hematoma.  He developed aphasia and right hand and arm weakness.   It was elected to take patient to surgery for left craniotomy for SDH.  Procedure:  Following smooth intubation, patient was placed in right semi-lateral position with blanket roll.  Head was placed on donut head holder and left frontal scalp was shaved and prepped and draped in usual sterile fashion.  Area of planned incision was infiltrated with lidocaine. A curvilinear incision was made and carried through temporalis fascia and muscle to expose calvarium.  Skull flap was elevated exposing subdural hematoma.  Dura was opened and subdural was evacuated.  The subdural space was irrigated with copious saline.    Hemostasis was assured.  The brain was considerably more relaxed after hematoma evacuation.  A 10 JP drain was placed, bone flap was replaced with plates, the fascia and galea were closed with 2-0 vicryl sutures and the skin was re approximated with staples.  A sterile occlusive dressing was placed.  Patient was returned to a supine position and extubated in the  operating room and taken to recovery in stable and satisfactory condition having tolerated his surgery well.  PLAN OF CARE: Admit to inpatient   PATIENT DISPOSITION:  PACU - hemodynamically stable.   Delay start of Pharmacological VTE agent (>24hrs) due to surgical blood loss or risk of bleeding: yes

## 2014-09-12 NOTE — Progress Notes (Signed)
Awake, alert, speech clearer and right hand strength improved.  Doing well.

## 2014-09-12 NOTE — Transfer of Care (Signed)
Immediate Anesthesia Transfer of Care Note  Patient: Allen Myers  Procedure(s) Performed: Procedure(s): CRANIOTOMY HEMATOMA EVACUATION SUBDURAL (Left)  Patient Location: PACU  Anesthesia Type:General  Level of Consciousness: awake  Airway & Oxygen Therapy: Patient Spontanous Breathing and Patient connected to face mask oxygen  Post-op Assessment: Report given to PACU RN and Post -op Vital signs reviewed and stable  Post vital signs: Reviewed and stable  Complications: No apparent anesthesia complications

## 2014-09-12 NOTE — Progress Notes (Signed)
Subjective: Patient reports continued aphasia and right hand and arm weakness  Objective: Vital signs in last 24 hours: Temp:  [97.7 F (36.5 C)-98.2 F (36.8 C)] 98 F (36.7 C) (11/30 0505) Pulse Rate:  [62-77] 68 (11/30 0505) Resp:  [17-22] 18 (11/30 0505) BP: (109-137)/(65-72) 130/72 mmHg (11/30 0505) SpO2:  [92 %-98 %] 98 % (11/30 0505)  Intake/Output from previous day:   Intake/Output this shift:    Physical Exam: Patient has dysarthria, slow initiation of speech, intact comprehension.  Right arm numbness, right hand clumsiness and weakness  Lab Results: No results for input(s): WBC, HGB, HCT, PLT in the last 72 hours. BMET No results for input(s): NA, K, CL, CO2, GLUCOSE, BUN, CREATININE, CALCIUM in the last 72 hours.  Studies/Results: Ct Head Wo Contrast  09/11/2014   CLINICAL DATA:  Followup subdural hematoma.  EXAM: CT HEAD WITHOUT CONTRAST  TECHNIQUE: Contiguous axial images were obtained from the base of the skull through the vertex without intravenous contrast.  COMPARISON:  CT 09/10/2014  FINDINGS: Left-sided subdural hematoma is unchanged measuring approximately 11 mm in thickness. Continued evolution with decreased density in the subdural hematoma compared with prior studies. No new area of hemorrhage. Small area of high density subdural hematoma in the left temporal lobe anteriorly unchanged. Small right frontal subdural hematoma unchanged. Interhemispheric small subdural hematoma and hygroma unchanged.  Midline shift to the right measures 6 mm and is unchanged  No new area of hemorrhage or infarction.  Negative for mass lesion.  IMPRESSION: Bilateral subdural hematomas are stable in size. Continued liquefaction of left-sided subdural hematoma. 6 mm midline shift to the right is stable.   Electronically Signed   By: Franchot Gallo M.D.   On: 09/11/2014 11:43   Ct Head Wo Contrast  09/10/2014   CLINICAL DATA:  Followup subdural hematoma. Worsening right hand weakness.   EXAM: CT HEAD WITHOUT CONTRAST  TECHNIQUE: Contiguous axial images were obtained from the base of the skull through the vertex without intravenous contrast.  COMPARISON:  CT head 09/07/2014  FINDINGS: Mixed density subdural hematoma on the left appears slightly larger compared with the prior study. This measures 10.7 mm in the frontal area and 9 mm over the convexity region. Anterior component is hypodense, posterior layering blood products or hyperdense as noted previously. No new hemorrhage. Small left temporal subdural hematoma unchanged.  Small right frontal subdural hematoma unchanged. Interhemispheric subdural hematoma and hygroma slightly improved.  No new hemorrhage. No acute infarct. Ventricle size normal. Slight increase in midline shift to the right now measuring 5.5 mm compared with 4.4 mm previously.  IMPRESSION: Left-sided subdural hematoma slightly larger. No definite repeat hemorrhage is identified. 1 mm more of midline shift to the right compared with the prior study.  Small right frontal subdural hematoma unchanged. Interhemispheric subdural hematoma slightly improved.   Electronically Signed   By: Franchot Gallo M.D.   On: 09/10/2014 11:27    Assessment/Plan: I reviewed Head CT and met with patient, his wife, and Dr. Janann Colonel.  Options are long term monitoring with EEG and possible surgery or proceeding with surgical drainage of SDH.  Patient and his wife are inclined to progress with surgery.  This has been scheduled this afternoon.    LOS: 6 days    Peggyann Shoals, MD 09/12/2014, 7:10 AM

## 2014-09-12 NOTE — Progress Notes (Signed)
Inpatient Diabetes Program Recommendations  AACE/ADA: New Consensus Statement on Inpatient Glycemic Control (2013)  Target Ranges:  Prepandial:   less than 140 mg/dL      Peak postprandial:   less than 180 mg/dL (1-2 hours)      Critically ill patients:  140 - 180 mg/dL    Inpatient Diabetes Program Recommendations Correction (SSI): Please use sensitive correction whiile on decadron Diet: Please add carb modified to diet orders once eating and while taking decadron  Thank you, Rosita Kea, RN, CNS, Diabetes Coordinator [phone])

## 2014-09-12 NOTE — Progress Notes (Signed)
NEURO HOSPITALIST PROGRESS NOTE   SUBJECTIVE:                                                                                                                        Continues to have aphasia and right hand weakness. Notes slight improvement with start of IV steroids.   History: Mr. Wilemon had an episode of right arm weakness, speech difficulty and right face tingling without alteration of consciousness lasting for about one hour this morning. STAT CT brain showed that left SDH is unchanged in size and midline shift remains the same. He just returned from CT and is having another event in which he is weak in the right arm and can not even use the right hand to eat his food.  On keppra 750 mg BID.   OBJECTIVE:                                                                                                                           Vital signs in last 24 hours: Temp:  [97.7 F (36.5 C)-98.2 F (36.8 C)] 98 F (36.7 C) (11/30 0505) Pulse Rate:  [62-77] 68 (11/30 0505) Resp:  [17-22] 18 (11/30 0505) BP: (109-137)/(65-72) 130/72 mmHg (11/30 0505) SpO2:  [92 %-98 %] 98 % (11/30 0505)  Intake/Output from previous day:   Intake/Output this shift:   Nutritional status: Diet - low sodium heart healthy Diet NPO time specified  Past Medical History  Diagnosis Date  . Hyperlipidemia   . History of colon polyps   . History of diverticulitis   . Erectile dysfunction   . HYPERTENSION   . SDH (subdural hematoma)     Following syncopal episode  . SAH (subarachnoid hemorrhage)     Following syncopal episode  . Kidney stones     "passed it"  . Sleep apnea in adult   . Hypothyroidism   . Depression   . Type II diabetes mellitus   . Seizures     just once a few weeks ago" (05/11/2014)  Physical exam: pleasant male in no apparent distress. Head: normocephalic. Neck: supple, no bruits, no JVD. Cardiac: no murmurs. Lungs: clear. Abdomen: soft, no  tender, no mass. Extremities: no edema.  Neurologic Exam:  General:  Mental Status: Alert, oriented, thought content appropriate.Mild dysarthria without evidence of aphasia. Able to follow 3 step commands without difficulty. Cranial Nerves: II:  Visual fields grossly normal, pupils equal, round, reactive to light and accommodation III,IV, VI: ptosis not present, extra-ocular motions intact bilaterally V,VII: smile symmetric, facial light touch sensation normal bilaterally VIII: hearing normal bilaterally Motor: Significant for  weakness right upper extremity Tone and bulk:normal tone throughout; no atrophy noted Sensory: Pinprick and light touch intact throughout, bilaterally Plantars: Right: downgoingLeft: downgoing Cerebellar: normal finger-to-nose, normal heel-to-shin test Gait:  No ataxia.   Lab Results: Lab Results  Component Value Date/Time   CHOL 77 07/13/2014 07:36 AM   Lipid Panel No results for input(s): CHOL, TRIG, HDL, CHOLHDL, VLDL, LDLCALC in the last 72 hours.  Studies/Results: Ct Head Wo Contrast  09/11/2014   CLINICAL DATA:  Followup subdural hematoma.  EXAM: CT HEAD WITHOUT CONTRAST  TECHNIQUE: Contiguous axial images were obtained from the base of the skull through the vertex without intravenous contrast.  COMPARISON:  CT 09/10/2014  FINDINGS: Left-sided subdural hematoma is unchanged measuring approximately 11 mm in thickness. Continued evolution with decreased density in the subdural hematoma compared with prior studies. No new area of hemorrhage. Small area of high density subdural hematoma in the left temporal lobe anteriorly unchanged. Small right frontal subdural hematoma unchanged. Interhemispheric small subdural hematoma and hygroma unchanged.  Midline shift to the right measures 6 mm and is unchanged  No new area of hemorrhage or infarction.  Negative for mass lesion.  IMPRESSION: Bilateral subdural hematomas are stable  in size. Continued liquefaction of left-sided subdural hematoma. 6 mm midline shift to the right is stable.   Electronically Signed   By: Franchot Gallo M.D.   On: 09/11/2014 11:43   Ct Head Wo Contrast  09/10/2014   CLINICAL DATA:  Followup subdural hematoma. Worsening right hand weakness.  EXAM: CT HEAD WITHOUT CONTRAST  TECHNIQUE: Contiguous axial images were obtained from the base of the skull through the vertex without intravenous contrast.  COMPARISON:  CT head 09/07/2014  FINDINGS: Mixed density subdural hematoma on the left appears slightly larger compared with the prior study. This measures 10.7 mm in the frontal area and 9 mm over the convexity region. Anterior component is hypodense, posterior layering blood products or hyperdense as noted previously. No new hemorrhage. Small left temporal subdural hematoma unchanged.  Small right frontal subdural hematoma unchanged. Interhemispheric subdural hematoma and hygroma slightly improved.  No new hemorrhage. No acute infarct. Ventricle size normal. Slight increase in midline shift to the right now measuring 5.5 mm compared with 4.4 mm previously.  IMPRESSION: Left-sided subdural hematoma slightly larger. No definite repeat hemorrhage is identified. 1 mm more of midline shift to the right compared with the prior study.  Small right frontal subdural hematoma unchanged. Interhemispheric subdural hematoma slightly improved.   Electronically Signed   By: Franchot Gallo M.D.   On: 09/10/2014 11:27    MEDICATIONS  Scheduled: . atorvastatin  80 mg Oral q1800  . dexamethasone  4 mg Intravenous 4 times per day  . levETIRAcetam  1,000 mg Oral BID  . levothyroxine  100 mcg Oral QAC breakfast  . pantoprazole  40 mg Oral Q supper  . senna-docusate  1 tablet Oral BID    ASSESSMENT/PLAN:                                                                                                            68 y/o with subacute bilateral SDH. Having continuous fluctuating aphasia and RUE weakness. Concern is that this is related to mass effect from SDH, may also be causing focal seizure activity though this is less likely.   Discussed plans with patient, spouse and neurosurgery (Dr Vertell Limber).   -will continue keppra 1000mg  BID -tentative plan for surgical drainage today -post-op will need to discuss antiplatelet therapy with cardiology as patient was on brilinta for stents    [name], DO [facility] [phone]  If 7pm- 7am, please page neurology on call as listed in [place].   09/12/2014, 8:09 AM

## 2014-09-12 NOTE — Brief Op Note (Signed)
09/06/2014 - 09/12/2014  4:23 PM  PATIENT:  Allen Myers  68 y.o. male  PRE-OPERATIVE DIAGNOSIS:  Left Subdural Hematoma with aphasia and hemiparesis  POST-OPERATIVE DIAGNOSIS:  Left Subdural Hematoma with aphasia and hemiparesis  PROCEDURE:  Procedure(s): CRANIOTOMY HEMATOMA EVACUATION SUBDURAL (Left)  SURGEON:  Surgeon(s) and Role:    * Erline Levine, MD - Primary  PHYSICIAN ASSISTANT: Arnoldo Morale  ASSISTANTS: Poteat, RN   ANESTHESIA:   general  EBL:  Total I/O In: 700 [I.V.:700] Out: 75 [Blood:75]  BLOOD ADMINISTERED:none  DRAINS: (10) Jackson-Pratt drain(s) with closed bulb suction in the subdural space   LOCAL MEDICATIONS USED:  LIDOCAINE   SPECIMEN:  No Specimen  DISPOSITION OF SPECIMEN:  N/A  COUNTS:  YES  TOURNIQUET:  * No tourniquets in log *  DICTATION: Patient is 68 year old man who fell and struck his head and developed a left subdural hematoma.  He developed aphasia and right hand and arm weakness.   It was elected to take patient to surgery for left craniotomy for SDH.  Procedure:  Following smooth intubation, patient was placed in right semi-lateral position with blanket roll.  Head was placed on donut head holder and left frontal scalp was shaved and prepped and draped in usual sterile fashion.  Area of planned incision was infiltrated with lidocaine. A curvilinear incision was made and carried through temporalis fascia and muscle to expose calvarium.  Skull flap was elevated exposing subdural hematoma.  Dura was opened and subdural was evacuated.  The subdural space was irrigated with copious saline.    Hemostasis was assured.  The brain was considerably more relaxed after hematoma evacuation.  A 10 JP drain was placed, bone flap was replaced with plates, the fascia and galea were closed with 2-0 vicryl sutures and the skin was re approximated with staples.  A sterile occlusive dressing was placed.  Patient was returned to a supine position and extubated in the  operating room and taken to recovery in stable and satisfactory condition having tolerated his surgery well.  PLAN OF CARE: Admit to inpatient   PATIENT DISPOSITION:  PACU - hemodynamically stable.   Delay start of Pharmacological VTE agent (>24hrs) due to surgical blood loss or risk of bleeding: yes

## 2014-09-12 NOTE — Anesthesia Postprocedure Evaluation (Signed)
  Anesthesia Post-op Note  Patient: Allen Myers  Procedure(s) Performed: Procedure(s): CRANIOTOMY HEMATOMA EVACUATION SUBDURAL (Left)  Patient Location: PACU  Anesthesia Type:General  Level of Consciousness: awake, alert  and oriented  Airway and Oxygen Therapy: Patient Spontanous Breathing and Patient connected to nasal cannula oxygen  Post-op Pain: mild  Post-op Assessment: Post-op Vital signs reviewed, Patient's Cardiovascular Status Stable, Respiratory Function Stable, Patent Airway, No signs of Nausea or vomiting and Pain level controlled  Post-op Vital Signs: stable  Last Vitals:  Filed Vitals:   09/12/14 1730  BP: 137/82  Pulse: 60  Temp:   Resp: 10    Complications: No apparent anesthesia complications

## 2014-09-12 NOTE — Progress Notes (Signed)
Patient transported to surgery.  Kizzie Bane, RN

## 2014-09-12 NOTE — Anesthesia Preprocedure Evaluation (Addendum)
Anesthesia Evaluation  Patient identified by MRN, date of birth, ID band Patient awake    Reviewed: Allergy & Precautions, H&P , NPO status , Patient's Chart, lab work & pertinent test results  Airway Mallampati: II  TM Distance: >3 FB Neck ROM: full    Dental  (+) Teeth Intact, Dental Advisory Given   Pulmonary sleep apnea , former smoker,          Cardiovascular hypertension, + CAD, + Cardiac Stents and + Peripheral Vascular Disease     Neuro/Psych Seizures -,  Depression Pt with SDH.     GI/Hepatic   Endo/Other  diabetes, Type 2Hypothyroidism   Renal/GU      Musculoskeletal   Abdominal   Peds  Hematology   Anesthesia Other Findings   Reproductive/Obstetrics                            Anesthesia Physical Anesthesia Plan  ASA: III  Anesthesia Plan: General   Post-op Pain Management:    Induction: Intravenous  Airway Management Planned: Oral ETT  Additional Equipment:   Intra-op Plan:   Post-operative Plan: Extubation in OR  Informed Consent: I have reviewed the patients History and Physical, chart, labs and discussed the procedure including the risks, benefits and alternatives for the proposed anesthesia with the patient or authorized representative who has indicated his/her understanding and acceptance.   Dental advisory given  Plan Discussed with: CRNA, Anesthesiologist and Surgeon  Anesthesia Plan Comments:        Anesthesia Quick Evaluation

## 2014-09-13 ENCOUNTER — Encounter (HOSPITAL_COMMUNITY): Payer: Self-pay | Admitting: Neurosurgery

## 2014-09-13 ENCOUNTER — Encounter (HOSPITAL_COMMUNITY): Payer: Self-pay

## 2014-09-13 LAB — GLUCOSE, CAPILLARY
GLUCOSE-CAPILLARY: 192 mg/dL — AB (ref 70–99)
Glucose-Capillary: 180 mg/dL — ABNORMAL HIGH (ref 70–99)
Glucose-Capillary: 186 mg/dL — ABNORMAL HIGH (ref 70–99)
Glucose-Capillary: 178 mg/dL — ABNORMAL HIGH (ref 70–99)
Glucose-Capillary: 169 mg/dL — ABNORMAL HIGH (ref 70–99)
Glucose-Capillary: 168 mg/dL — ABNORMAL HIGH (ref 70–99)
Glucose-Capillary: 150 mg/dL — ABNORMAL HIGH (ref 70–99)

## 2014-09-13 MED ORDER — LEVETIRACETAM 500 MG PO TABS
1000.0000 mg | ORAL_TABLET | Freq: Two times a day (BID) | ORAL | Status: DC
  Administered 2014-09-13 – 2014-09-16 (×7): 1000 mg via ORAL
  Filled 2014-09-13 (×9): qty 2

## 2014-09-13 NOTE — Progress Notes (Signed)
Occupational Therapy Re-Evaluation Patient Details Name: Allen Myers MRN: [id number] DOB: 1946/09/17 Today's Date: 09/13/2014    History of Present Illness 68 y/o admitted with new onset transient confusion associated with language impairment for approximately 30 minutes. CT brain with new bilateral and parafalcine small SDH. Was to be D/C'd a couple of days ago but developed increased issues with use of RUE and increased speech issues-. Underwent craniotomy for L SDH 11/30.    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 Pt presents with RU distal weakness and decreased fine motor/gross motor skills. Recommend pt follow up with OT at the neuro outpt center. Also recommend speech consult as pt demonstrates expressive language difficulties. Will follow acutely to increase functional use of RUE.     Follow Up Recommendations  Outpatient OT;Supervision - Intermittent (neuro outpt OT)    Equipment Recommendations  None recommended by OT    Recommendations for Other Services Speech consult     Precautions / Restrictions Precautions Precautions: None Restrictions Weight Bearing Restrictions: No      Mobility Bed Mobility Overal bed mobility: Modified Independent                Transfers Overall transfer level: Needs assistance   Transfers: Sit to/from Stand Sit to Stand: Supervision         General transfer comment: per nursing pt ambulating without difficulty. no apparent difficulty observed during eval    Balance Overall balance assessment: No apparent balance deficits (not formally assessed)                                          ADL Overall ADL's : Needs assistance/impaired Eating/Feeding: Set up (needs assistance to open containers and use built up tubing )   Grooming: Set up;Sitting (increased time and focus on what RUE is doing)   Upper Body Bathing: Set up;Sitting  (increased time and focus on what RUE is doing; advised to use squeeze soap v. bar to hopefully lessen the chance of dropping it)   Lower Body Bathing: Set up;Sit to/from stand (increased time and focus on what RUE is doing)   Upper Body Dressing : Minimal assistance (for FM (such as buttons))   Lower Body Dressing: Minimal assistance (for FM (such as buttons and/or snaps)   Toilet Transfer: Supervision/safety   Toileting- Clothing Manipulation and Hygiene: Supervision/safety;Sit to/from stand (increased time and focus on what RUE is doing)       Functional mobility during ADLs: Supervision/safety (only bathroom priviledges. no apparent mobility issues) General ADL Comments: ADL affected by decreased functional use RUE     Vision      at baseline. No new deficits               Perception     Praxis      Pertinent Vitals/Pain Pain Assessment: 0-10 Pain Score: 3  Pain Location: head Pain Descriptors / Indicators: Aching Pain Intervention(s): Limited activity within patient's tolerance;Monitored during session     Hand Dominance Right   Extremity/Trunk Assessment Upper Extremity Assessment Upper Extremity Assessment: RUE deficits/detail RUE Deficits / Details: Increased distal weakness and poor isloated joint movement. shoulder/elbow and wrist AROM WFL. Strength WFL. decreased grip and pinch strength - @ 3+/5. Unable to isolate finger movement. unable to point or give 2 fingers RUE  Sensation: decreased light touch (c/o R hand/forearm feeling "numb") RUE Coordination: decreased fine motor;decreased gross motor   Lower Extremity Assessment Lower Extremity Assessment: Overall WFL for tasks assessed   Cervical / Trunk Assessment Cervical / Trunk Assessment: Normal   Communication Communication Communication: Expressive difficulties   Cognition Arousal/Alertness: Awake/alert Behavior During Therapy: WFL for tasks assessed/performed Overall Cognitive Status: Within  Functional Limits for tasks assessed                     General Comments       Exercises   Other Exercises Other Exercises: Pt has HEP from previous visit. Pt presetns with greater deficits with R hand at this time. Additional exercises given to increase isolated joint movement, fine motor and coordination. Pt observed compensating for weakness by "hiking" shoulder. Given cues to "quiet" shoulder. Pt able to return demonstrate.    Shoulder Instructions      Home Living Family/patient expects to be discharged to:: Private residence Living Arrangements: Spouse/significant other Available Help at Discharge: Family;Available 24 hours/day Type of Home: House Home Access: Level entry     Home Layout: Two level;Bed/bath upstairs Alternate Level Stairs-Number of Steps: 12 Alternate Level Stairs-Rails: Right;Left Bathroom Shower/Tub: International aid/development worker Accessibility: Yes How Accessible: Accessible via walker Home Equipment: None          Prior Functioning/Environment Level of Independence: Independent        Comments: works at a desk job, Radio broadcast assistant Diagnosis: Hemiplegia dominant side   OT Problem List: Decreased strength;Impaired UE functional use;Decreased coordination   OT Treatment/Interventions:      OT Goals(Current goals can be found in the care plan section) Acute Rehab OT Goals Patient Stated Goal: for my arm to work normally OT Goal Formulation: With patient Time For Goal Achievement: 09/27/14 Potential to Achieve Goals: Good  OT Frequency:     Barriers to D/C:            Co-evaluation              End of Session Nurse Communication: Mobility status (need for neuro outpt OT)  Activity Tolerance: Patient tolerated treatment well Patient left: in bed;with call bell/phone within reach   Time: 0240-9735 OT Time Calculation (min): 19 min Charges:  OT General Charges $OT Visit: 1 Procedure OT Evaluation $OT Re-eval: 1  Procedure OT Treatments $Therapeutic Activity: 8-22 mins G-Codes:    Yetunde Leis,HILLARY Sep 14, 2014, 2:27 PM   [facility], OTR/L  [phone] 09/14/2014

## 2014-09-13 NOTE — Plan of Care (Signed)
Problem: Consults Goal: Craniotomy Patient Education See Patient Education Module for education specifics.  Outcome: Completed/Met Date Met:  09/13/14 Goal: Diagnosis - Craniotomy Outcome: Completed/Met Date Met:  09/13/14 Goal: Nutrition Consult-if indicated Outcome: Not Applicable Date Met:  11/65/79 Goal: Diabetes Guidelines if Diabetic/Glucose > 140 If diabetic or lab glucose is > 140 mg/dl - Initiate Diabetes/Hyperglycemia Guidelines & Document Interventions  Outcome: Completed/Met Date Met:  09/13/14  Problem: Phase I Progression Outcomes Goal: Neuro exam at baseline or improved Outcome: Completed/Met Date Met:  09/13/14 Goal: Hemodynamically stable Outcome: Completed/Met Date Met:  09/13/14 Goal: Pain controlled with appropriate interventions Outcome: Completed/Met Date Met:  09/13/14 Goal: Bedrest HOB at 30 degrees Outcome: Completed/Met Date Met:  09/13/14 Goal: Initial discharge plan identified Outcome: Completed/Met Date Met:  09/13/14 Goal: Other Phase I Outcomes/Goals Outcome: Completed/Met Date Met:  09/13/14  Problem: Phase II Progression Outcomes Goal: Neuro exam at baseline or improved Outcome: Completed/Met Date Met:  09/13/14 Goal: Extubated and maintains O2 sats > 92% Outcome: Completed/Met Date Met:  09/13/14 Goal: Hemodynamically stable Outcome: Completed/Met Date Met:  09/13/14 Goal: Pain controlled Outcome: Completed/Met Date Met:  09/13/14 Goal: Progress activity as tolerated unless otherwise ordered Outcome: Completed/Met Date Met:  09/13/14 Goal: Discharge plan established Outcome: Completed/Met Date Met:  09/13/14 Goal: Tolerating diet Outcome: Completed/Met Date Met:  09/13/14 Goal: Other Phase II Outcomes/Goals Outcome: Completed/Met Date Met:  09/13/14

## 2014-09-13 NOTE — Progress Notes (Signed)
Clarified with Dr. Vertell Limber and Dr. Stanford Breed that both Brilinta and Asprin are NOT TO BE GIVEN AT THIS TIME.

## 2014-09-13 NOTE — Progress Notes (Signed)
PROGRESS NOTE  Subjective:   This 68 year old male has a history of coronary artery disease. He presented with a subarachnoid hemorrhage and subdural hematoma in June following an injury on the roof. At the time he was found to have a cardiomyopathy with a low ejection fraction. He had his subdural treated and eventually had cardiac catheterization the end of July that showed moderate disease in the LAD and moderate to severe circumflex disease with a tight right coronary artery stenosis. At the time he had placement of a 3.5 x 20 mm drug-eluting stent to the right coronary artery that was dilated to 4.0 mm and he was placed on dual antiplatelet therapy with aspirin and BRILINTA. He had done well and had completed cardiac rehabilitation. His left ventricular function improved and his last EF was 50-55%. He noted that he slipped and had a fall about a week ago but did not have syncope. He recalls being struck in the head at a basketball game.He presented with speech difficulties and some transient aphasia and was found to have subdural hematomas that have been managed with observation; however, symptoms have worsened and surgical evacuation is planned for later today. He presently denies CP or dyspnea.  Objective:    Vital Signs:   Temp:  [97.5 F (36.4 C)-98.1 F (36.7 C)] 97.9 F (36.6 C) (12/01 0747) Pulse Rate:  [44-71] 44 (12/01 0700) Resp:  [9-22] 11 (12/01 0700) BP: (96-157)/(46-82) 106/56 mmHg (12/01 0700) SpO2:  [93 %-100 %] 96 % (12/01 0700) Weight:  [225 lb 8.5 oz (102.3 kg)] 225 lb 8.5 oz (102.3 kg) (11/30 1744)  Last BM Date: 09/12/14   24-hour weight change: Weight change:   Weight trends: Filed Weights   09/06/14 2310 09/12/14 1744  Weight: 225 lb 8.5 oz (102.3 kg) 225 lb 8.5 oz (102.3 kg)    Intake/Output:  11/30 0701 - 12/01 0700 In: 1577.5 [I.V.:1477.5; IV Piggyback:100] Out: 1040 [Urine:800; Drains:165; Blood:75]     Physical Exam: BP 106/56  mmHg  Pulse 44  Temp(Src) 97.9 F (36.6 C) (Oral)  Resp 11  Ht 6\' 1"  (1.854 m)  Wt 225 lb 8.5 oz (102.3 kg)  BMI 29.76 kg/m2  SpO2 96%  Wt Readings from Last 3 Encounters:  09/12/14 225 lb 8.5 oz (102.3 kg)  07/28/14 234 lb 12.8 oz (106.505 kg)  07/26/14 235 lb 8 oz (106.822 kg)    General: Vital signs reviewed and noted.   Head: S/p craniotomy for SDH        Neck:  supple  Lungs:     Clear   Heart:   RRR  Abdomen:  Soft, non-tender, non-distended    Extremities: No edema    Neurologic: Dysarthria and weakness RUE        Medications:    Infusions: . sodium chloride 1,000 mL (09/08/14 0504)  . 0.9 % NaCl with KCl 20 mEq / L 75 mL/hr at 09/13/14 0700    Scheduled Medications: . aspirin EC  81 mg Oral Daily  . atorvastatin  80 mg Oral q1800  . dexamethasone  4 mg Intravenous 4 times per day  . docusate sodium  100 mg Oral BID  . levETIRAcetam  1,000 mg Oral BID  . levothyroxine  100 mcg Oral QAC breakfast  . linagliptin  5 mg Oral Daily  . lisinopril  2.5 mg Oral Daily  . metFORMIN  1,000 mg Oral BID WC  . pantoprazole (PROTONIX) IV  40 mg Intravenous QHS  .  pioglitazone  45 mg Oral Daily  . pneumococcal 13-valent conjugate vaccine  0.5 mL Intramuscular Tomorrow-1000  . senna  1 tablet Oral BID    Assessment/ Plan:   Active Problems:   Diabetes mellitus type 2, noninsulin dependent   Hypertensive heart disease   Seizure   Coronary atherosclerosis of native coronary artery   SDH (subdural hematoma)   Subdural hematoma  1. CAD - s/p stenting with a DES 4 months ago. Brilinta has been stopped. ASA has been resumed. Remains in sinus.  2. SAH/SDH-s/p evacuation; management per neurosurgery.  3. Hyperlipidemia-continue statin.  Kirk Ruths, MD, [facility] 09/13/2014, 10:26 AM

## 2014-09-13 NOTE — Progress Notes (Signed)
Subjective: Patient reports "Better...just a little last night (headache).Marland KitchenMarland KitchenI couldn't do this before (RUE over head)"  Objective: Vital signs in last 24 hours: Temp:  [97.4 F (36.3 C)-98.1 F (36.7 C)] 97.6 F (36.4 C) (12/01 0400) Pulse Rate:  [44-71] 44 (12/01 0700) Resp:  [9-22] 11 (12/01 0700) BP: (96-157)/(46-82) 106/56 mmHg (12/01 0700) SpO2:  [93 %-100 %] 96 % (12/01 0700) Weight:  [102.3 kg (225 lb 8.5 oz)] 102.3 kg (225 lb 8.5 oz) (11/30 1744)  Intake/Output from previous day: 11/30 0701 - 12/01 0700 In: 1577.5 [I.V.:1477.5; IV Piggyback:100] Out: 5621 [Urine:800; Drains:165; Blood:75] Intake/Output this shift:    Alert, conversant with improving enunciation and word finding. Improved strength right hand and arm. He reports decreased numbness and tingling - now only right forearm to hand, not entire RUE. PEARL. JP patent, drainage from insertion site has stopped. Incision drsg remains intact with minimal blood stain.   Lab Results: No results for input(s): WBC, HGB, HCT, PLT in the last 72 hours. BMET No results for input(s): NA, K, CL, CO2, GLUCOSE, BUN, CREATININE, CALCIUM in the last 72 hours.  Studies/Results: Ct Head Wo Contrast  09/11/2014   CLINICAL DATA:  Followup subdural hematoma.  EXAM: CT HEAD WITHOUT CONTRAST  TECHNIQUE: Contiguous axial images were obtained from the base of the skull through the vertex without intravenous contrast.  COMPARISON:  CT 09/10/2014  FINDINGS: Left-sided subdural hematoma is unchanged measuring approximately 11 mm in thickness. Continued evolution with decreased density in the subdural hematoma compared with prior studies. No new area of hemorrhage. Small area of high density subdural hematoma in the left temporal lobe anteriorly unchanged. Small right frontal subdural hematoma unchanged. Interhemispheric small subdural hematoma and hygroma unchanged.  Midline shift to the right measures 6 mm and is unchanged  No new area of  hemorrhage or infarction.  Negative for mass lesion.  IMPRESSION: Bilateral subdural hematomas are stable in size. Continued liquefaction of left-sided subdural hematoma. 6 mm midline shift to the right is stable.   Electronically Signed   By: Franchot Gallo M.D.   On: 09/11/2014 11:43    Assessment/Plan: Improving   LOS: 7 days  Per Dr.Stern, will continue to hold Brilinta and resume Keppra 1000mg  BID.  Appreciate Dr. Hazle Quant (neurology) input as well.    Verdis Prime 09/13/2014, 7:31 AM

## 2014-09-13 NOTE — Progress Notes (Signed)
NEURO HOSPITALIST PROGRESS NOTE   SUBJECTIVE:                                                                                                                        Post-op craniotomy and left hematoma evacuation. He feels better this morning, notes improved strength in RUE. Currently on keppra 1000mg  BID.   History: Allen Myers had an episode of right arm weakness, speech difficulty and right face tingling without alteration of consciousness lasting for about one hour this morning. STAT CT brain showed that left SDH is unchanged in size and midline shift remains the same. He just returned from CT and is having another event in which he is weak in the right arm and can not even use the right hand to eat his food.  On keppra 750 mg BID.   OBJECTIVE:                                                                                                                           Vital signs in last 24 hours: Temp:  [97.4 F (36.3 C)-98.1 F (36.7 C)] 97.9 F (36.6 C) (12/01 0747) Pulse Rate:  [44-71] 44 (12/01 0700) Resp:  [9-22] 11 (12/01 0700) BP: (96-157)/(46-82) 106/56 mmHg (12/01 0700) SpO2:  [93 %-100 %] 96 % (12/01 0700) Weight:  [102.3 kg (225 lb 8.5 oz)] 102.3 kg (225 lb 8.5 oz) (11/30 1744)  Intake/Output from previous day: 11/30 0701 - 12/01 0700 In: 1577.5 [I.V.:1477.5; IV Piggyback:100] Out: 1040 [Urine:800; Drains:165; Blood:75] Intake/Output this shift:   Nutritional status: Diet - low sodium heart healthy Diet Carb Modified  Past Medical History  Diagnosis Date  . Hyperlipidemia   . History of colon polyps   . History of diverticulitis   . Erectile dysfunction   . HYPERTENSION   . SDH (subdural hematoma)     Following syncopal episode  . SAH (subarachnoid hemorrhage)     Following syncopal episode  . Kidney stones     "passed it"  . Sleep apnea in adult   . Hypothyroidism   . Depression   . Type II diabetes mellitus   .  Seizures     just once a few weeks ago" (05/11/2014)  Physical exam:  pleasant male in no apparent distress. Head: normocephalic. Neck: supple, no bruits, no JVD. Cardiac: no murmurs. Lungs: clear. Abdomen: soft, no tender, no mass. Extremities: no edema.  Neurologic Exam:  General: Mental Status: Alert, oriented, thought content appropriate.Mild dysarthria with minimal expressive aphasia. Able to follow 3 step commands without difficulty. Cranial Nerves: II:  Visual fields grossly normal, pupils equal, round, reactive to light and accommodation III,IV, VI: ptosis not present, extra-ocular motions intact bilaterally V,VII: smile symmetric, facial light touch sensation normal bilaterally VIII: hearing normal bilaterally Motor: Improved strength/dexterity in RUE, continued minimal weakness distal RUE Tone and bulk:normal tone throughout; no atrophy noted Sensory: Pinprick and light touch intact throughout, bilaterally Plantars: Right: downgoingLeft: downgoing Cerebellar: normal finger-to-nose, normal heel-to-shin test Gait:  No ataxia.   Lab Results: Lab Results  Component Value Date/Time   CHOL 77 07/13/2014 07:36 AM   Lipid Panel No results for input(s): CHOL, TRIG, HDL, CHOLHDL, VLDL, LDLCALC in the last 72 hours.  Studies/Results: Ct Head Wo Contrast  09/11/2014   CLINICAL DATA:  Followup subdural hematoma.  EXAM: CT HEAD WITHOUT CONTRAST  TECHNIQUE: Contiguous axial images were obtained from the base of the skull through the vertex without intravenous contrast.  COMPARISON:  CT 09/10/2014  FINDINGS: Left-sided subdural hematoma is unchanged measuring approximately 11 mm in thickness. Continued evolution with decreased density in the subdural hematoma compared with prior studies. No new area of hemorrhage. Small area of high density subdural hematoma in the left temporal lobe anteriorly unchanged. Small right frontal subdural hematoma  unchanged. Interhemispheric small subdural hematoma and hygroma unchanged.  Midline shift to the right measures 6 mm and is unchanged  No new area of hemorrhage or infarction.  Negative for mass lesion.  IMPRESSION: Bilateral subdural hematomas are stable in size. Continued liquefaction of left-sided subdural hematoma. 6 mm midline shift to the right is stable.   Electronically Signed   By: Franchot Gallo M.D.   On: 09/11/2014 11:43    MEDICATIONS                                                                                                                        Scheduled: . aspirin EC  81 mg Oral Daily  . atorvastatin  80 mg Oral q1800  . dexamethasone  4 mg Intravenous 4 times per day  . docusate sodium  100 mg Oral BID  . levETIRAcetam  1,000 mg Oral BID  . levothyroxine  100 mcg Oral QAC breakfast  . linagliptin  5 mg Oral Daily  . lisinopril  2.5 mg Oral Daily  . metFORMIN  1,000 mg Oral BID WC  . pantoprazole (PROTONIX) IV  40 mg Intravenous QHS  . pioglitazone  45 mg Oral Daily  . pneumococcal 13-valent conjugate vaccine  0.5 mL Intramuscular Tomorrow-1000  . senna  1 tablet Oral BID    ASSESSMENT/PLAN:  68 y/o with subacute bilateral SDH. Having continuous fluctuating aphasia and RUE weakness. S/P craniotomy and drainage of hematoma on 09/12/2014. Note good improvement in speech and strength of RUE. Would continue keppra at current dose.   Discussed plans with patient, spouse and neurosurgery (Dr Vertell Limber).   -continue keppra 1000mg  BID -defer restarting antiplatelet to cardiology and neurosurgery -will continue to follow   [name], DO [facility] [phone]  If 7pm- 7am, please page neurology on call as listed in [place].   09/13/2014, 9:18 AM

## 2014-09-13 NOTE — Plan of Care (Signed)
Problem: Consults Goal: Skin Care Protocol Initiated - if Braden Score 18 or less If consults are not indicated, leave blank or document N/A  Outcome: Completed/Met Date Met:  09/13/14  Problem: Progression Outcomes Goal: If vent dependent, tolerates weaning Outcome: Not Applicable Date Met:  63/01/60  Problem: Consults Goal: Skin Care Protocol Initiated - if Braden Score 18 or less If consults are not indicated, leave blank or document N/A  Outcome: Completed/Met Date Met:  09/13/14  Problem: Phase I Progression Outcomes Goal: Neuro exam at baseline or improved Outcome: Progressing Goal: Respiratory status stable Outcome: Completed/Met Date Met:  09/13/14 Goal: Voiding-avoid urinary catheter unless indicated Outcome: Completed/Met Date Met:  09/13/14

## 2014-09-14 LAB — GLUCOSE, CAPILLARY
GLUCOSE-CAPILLARY: 165 mg/dL — AB (ref 70–99)
GLUCOSE-CAPILLARY: 157 mg/dL — AB (ref 70–99)
GLUCOSE-CAPILLARY: 140 mg/dL — AB (ref 70–99)
Glucose-Capillary: 160 mg/dL — ABNORMAL HIGH (ref 70–99)
Glucose-Capillary: 194 mg/dL — ABNORMAL HIGH (ref 70–99)
Glucose-Capillary: 196 mg/dL — ABNORMAL HIGH (ref 70–99)

## 2014-09-14 MED ORDER — ASPIRIN 81 MG PO CHEW
81.0000 mg | CHEWABLE_TABLET | Freq: Every day | ORAL | Status: DC
  Administered 2014-09-14 – 2014-09-16 (×3): 81 mg via ORAL
  Filled 2014-09-14 (×3): qty 1

## 2014-09-14 NOTE — Progress Notes (Signed)
NEURO HOSPITALIST PROGRESS NOTE   SUBJECTIVE:                                                                                                                        Post-op craniotomy and left hematoma evacuation. He feels better this morning, notes improved strength in RUE. Continues to have transient episodes of RUE weakness and speech difficulties.  Currently on keppra 1000mg  BID.   History: Allen Myers had an episode of right arm weakness, speech difficulty and right face tingling without alteration of consciousness lasting for about one hour this morning. STAT CT brain showed that left SDH is unchanged in size and midline shift remains the same. He just returned from CT and is having another event in which he is weak in the right arm and can not even use the right hand to eat his food.     OBJECTIVE:                                                                                                                           Vital signs in last 24 hours: Temp:  [97.3 F (36.3 C)-98.1 F (36.7 C)] 97.3 F (36.3 C) (12/02 0800) Pulse Rate:  [48-67] 55 (12/02 0900) Resp:  [8-17] 12 (12/02 0900) BP: (90-131)/(46-70) 131/70 mmHg (12/02 0900) SpO2:  [93 %-99 %] 95 % (12/02 0900)  Intake/Output from previous day: 12/01 0701 - 12/02 0700 In: 21 [P.O.:960] Out: 25 [Drains:25] Intake/Output this shift:   Nutritional status: Diet - low sodium heart healthy Diet Carb Modified  Past Medical History  Diagnosis Date  . Hyperlipidemia   . History of colon polyps   . History of diverticulitis   . Erectile dysfunction   . HYPERTENSION   . SDH (subdural hematoma)     Following syncopal episode  . SAH (subarachnoid hemorrhage)     Following syncopal episode  . Kidney stones     "passed it"  . Sleep apnea in adult   . Hypothyroidism   . Depression   . Type II diabetes mellitus   . Seizures     just once a few weeks ago" (05/11/2014)  Physical  exam: pleasant male in no apparent distress. Head: normocephalic. Neck: supple, no bruits,  no JVD. Cardiac: no murmurs. Lungs: clear. Abdomen: soft, no tender, no mass. Extremities: no edema.  Neurologic Exam:  General: Mental Status: Alert, oriented, thought content appropriate.Mild dysarthria with minimal expressive aphasia. Able to follow 3 step commands without difficulty. Cranial Nerves: II:  Visual fields grossly normal, pupils equal, round, reactive to light and accommodation III,IV, VI: ptosis not present, extra-ocular motions intact bilaterally V,VII: smile symmetric, facial light touch sensation normal bilaterally VIII: hearing normal bilaterally Motor: Improved strength/dexterity in RUE, continued minimal weakness distal RUE Tone and bulk:normal tone throughout; no atrophy noted Sensory: Pinprick and light touch intact throughout, bilaterally Plantars: Right: downgoingLeft: downgoing Cerebellar: normal finger-to-nose, normal heel-to-shin test Gait:  No ataxia.   Lab Results: Lab Results  Component Value Date/Time   CHOL 77 07/13/2014 07:36 AM   Lipid Panel No results for input(s): CHOL, TRIG, HDL, CHOLHDL, VLDL, LDLCALC in the last 72 hours.  Studies/Results: No results found.  MEDICATIONS                                                                                                                        Scheduled: . aspirin  81 mg Oral Daily  . atorvastatin  80 mg Oral q1800  . dexamethasone  4 mg Intravenous 4 times per day  . docusate sodium  100 mg Oral BID  . levETIRAcetam  1,000 mg Oral BID  . levothyroxine  100 mcg Oral QAC breakfast  . linagliptin  5 mg Oral Daily  . lisinopril  2.5 mg Oral Daily  . metFORMIN  1,000 mg Oral BID WC  . pantoprazole (PROTONIX) IV  40 mg Intravenous QHS  . pioglitazone  45 mg Oral Daily  . pneumococcal 13-valent conjugate vaccine  0.5 mL Intramuscular Tomorrow-1000  . senna  1  tablet Oral BID    ASSESSMENT/PLAN:                                                                                                           68 y/o with subacute bilateral SDH. Having continuous fluctuating aphasia and RUE weakness. S/P craniotomy and drainage of hematoma on 09/12/2014. Note good improvement in speech and strength of RUE. Would continue keppra at current dose.   Discussed plans with patient, spouse and neurosurgery (Dr Vertell Limber).   -continue keppra 1000mg  BID -per neurosurgery will restart ASA today -will continue to follow   Allen Myers, Allen Myers [facility] [phone]  If 7pm- 7am, please page neurology on call as listed in [place].   09/14/2014, 10:08 AM

## 2014-09-14 NOTE — Plan of Care (Signed)
Problem: Phase III Progression Outcomes Goal: Pain controlled on oral analgesia Outcome: Completed/Met Date Met:  09/14/14

## 2014-09-14 NOTE — Progress Notes (Signed)
Subjective: Patient reports speech much improved, still minimally halting.  Arm and hand strength near normal.  Objective: Vital signs in last 24 hours: Temp:  [97.3 F (36.3 C)-98.1 F (36.7 C)] 97.3 F (36.3 C) (12/02 0800) Pulse Rate:  [48-67] 57 (12/02 0700) Resp:  [8-17] 8 (12/02 0700) BP: (90-121)/(46-64) 117/64 mmHg (12/02 0700) SpO2:  [93 %-100 %] 97 % (12/02 0700)  Intake/Output from previous day: 12/01 0701 - 12/02 0700 In: 960 [P.O.:960] Out: 10 [Drains:10] Intake/Output this shift:    Physical Exam: Patient improving without continuing episodes of weakness.  Continued bloody CSF drainage from JP drain.  Lab Results: No results for input(s): WBC, HGB, HCT, PLT in the last 72 hours. BMET No results for input(s): NA, K, CL, CO2, GLUCOSE, BUN, CREATININE, CALCIUM in the last 72 hours.  Studies/Results: No results found.  Assessment/Plan: Observe in ICU today and continue JP drain.  If minimal output today, will D/C in AM.  Restart baby ASA today.  Mobilize with PT.    LOS: 8 days    Manreet Kiernan D, MD 09/14/2014, 8:16 AM

## 2014-09-14 NOTE — Plan of Care (Signed)
Problem: Acute Treatment Outcomes Goal: 02 Sats > 94% Outcome: Completed/Met Date Met:  09/14/14     

## 2014-09-15 ENCOUNTER — Encounter (HOSPITAL_COMMUNITY): Payer: Self-pay

## 2014-09-15 LAB — GLUCOSE, CAPILLARY
GLUCOSE-CAPILLARY: 152 mg/dL — AB (ref 70–99)
GLUCOSE-CAPILLARY: 180 mg/dL — AB (ref 70–99)
Glucose-Capillary: 142 mg/dL — ABNORMAL HIGH (ref 70–99)
Glucose-Capillary: 152 mg/dL — ABNORMAL HIGH (ref 70–99)
Glucose-Capillary: 116 mg/dL — ABNORMAL HIGH (ref 70–99)
Glucose-Capillary: 159 mg/dL — ABNORMAL HIGH (ref 70–99)

## 2014-09-15 MED ORDER — PANTOPRAZOLE SODIUM 40 MG PO TBEC
40.0000 mg | DELAYED_RELEASE_TABLET | Freq: Every day | ORAL | Status: DC
  Administered 2014-09-15 – 2014-09-16 (×2): 40 mg via ORAL
  Filled 2014-09-15: qty 1

## 2014-09-15 NOTE — Progress Notes (Signed)
Pt transferred to 4N10, VS WNL, pt w/o c/o pain. AAO, neuro assessment unchanged. Family with pt, belongings transferred. Pt safety maintained, charge RN in room upon arrival.

## 2014-09-15 NOTE — Progress Notes (Signed)
Subjective: Patient reports "I think I'm better. They walked with me around the unit"  Objective: Vital signs in last 24 hours: Temp:  [97.3 F (36.3 C)-98.1 F (36.7 C)] 98.1 F (36.7 C) (12/03 0410) Pulse Rate:  [51-64] 51 (12/03 0300) Resp:  [10-14] 14 (12/03 0700) BP: (101-131)/(46-71) 106/53 mmHg (12/03 0700) SpO2:  [93 %-98 %] 94 % (12/03 0300)  Intake/Output from previous day: 12/02 0701 - 12/03 0700 In: 680 [P.O.:680] Out: 8 [Drains:8] Intake/Output this shift:    Alert, conversant, up in chair. PEARL. MAEW, improved strength RUE. Speech fluency and word finding improving, but continue to frustrate pt. JP ~12ml overnight. Incision without erythema, swelling or drainage. Pt tolerates drain removal very well. No bleeding from site.   Lab Results: No results for input(s): WBC, HGB, HCT, PLT in the last 72 hours. BMET No results for input(s): NA, K, CL, CO2, GLUCOSE, BUN, CREATININE, CALCIUM in the last 72 hours.  Studies/Results: No results found.  Assessment/Plan: Improving   LOS: 9 days  Drain pulled per DrStern's order. Incisions open to air, dry. May apply DSD prn. Per DrStern, d/c Decadron and transfer to floor.    Verdis Prime 09/15/2014, 7:39 AM

## 2014-09-15 NOTE — Progress Notes (Signed)
Occupational Therapy Treatment Patient Details Name: Allen Myers MRN: [id number] DOB: 06-26-1946 Today's Date: 09/15/2014    History of present illness 68 y/o admitted with new onset transient confusion associated with language impairment for approximately 30 minutes. CT brain with new bilateral and parafalcine small SDH. Was to be D/C'd a couple of days ago but developed increased issues with use of RUE and increased speech issues-. Underwent craniotomy for L SDH 11/30.    OT comments  Pt making excellent improvement with RUE. Given additional exercises for in hand manipulation skills and fine motor/coordination. Continue to recommend pt follow up with the neuro outpt center. Recommend speech eval due to expressive difficulties. Pt also asking for help with his speech. Will follow acutely to monitor rehab  Process as pt has intermittent occurances of R UE involvement.   Follow Up Recommendations  Outpatient OT;Supervision - Intermittent    Equipment Recommendations  None recommended by OT    Recommendations for Other Services Speech consult    Precautions / Restrictions Precautions Precautions: None Restrictions Weight Bearing Restrictions: No       Mobility Bed Mobility Overal bed mobility: Modified Independent                Transfers Overall transfer level: Modified independent                    Balance Overall balance assessment: No apparent balance deficits (not formally assessed)                                 ADL                                       Functional mobility during ADLs: Modified independent General ADL Comments: overall mod I with basic ADL Pt very excited about tying his own shoes today.      Vision                     Perception     Praxis      Cognition   Behavior During Therapy: [facility] for tasks assessed/performed Overall Cognitive Status: Within Functional Limits for tasks  assessed                       Extremity/Trunk Assessment     Improved RUE strength and fine motor skills. Isolated finger movement has improved. Pt able to point, show 2 fingers and give thumbs up, which he was unable to do in previous session. Pt states he has been doing his exercises "religiously"          Exercises Other Exercises Other Exercises: isolated movement of digits Other Exercises: in hand manipulation skills Other Exercises: pinch strengthening Other Exercises: copying lines/diagrams - pre writing and writing activities Other Exercises: encouraged typing on computer; coloring   Shoulder Instructions  less shoulder "hiking" observed this session     General Comments      Pertinent Vitals/ Pain       Pain Assessment: No/denies pain  Home Living                                          Prior Functioning/Environment  Frequency Min 2X/week     Progress Toward Goals  OT Goals(current goals can now be found in the care plan section)  Progress towards OT goals: Progressing toward goals  Acute Rehab OT Goals Patient Stated Goal: for my arm to work normally OT Goal Formulation: With patient Time For Goal Achievement: 09/27/14 Potential to Achieve Goals: Good ADL Goals Pt/caregiver will Perform Home Exercise Program: Increased ROM;Increased strength;Right Upper extremity;With written HEP provided Additional ADL Goal #1: Pt will verbalize understanding of safety concerns with decreased sensation of RUE and need to be aware of R hand/UE at all times to prevent injury  Plan Discharge plan remains appropriate    Co-evaluation                 End of Session     Activity Tolerance Patient tolerated treatment well   Patient Left in chair;with call bell/phone within reach   Nurse Communication Mobility status;Other (comment) (need for speech eval)        Time: 0240-9735 OT Time Calculation (min): 30  min  Charges: OT General Charges $OT Visit: 1 Procedure OT Treatments $Therapeutic Activity: 23-37 mins  Meko Bellanger,HILLARY 09/15/2014, 1:27 PM  [facility], OTR/L  [phone] 09/15/2014

## 2014-09-15 NOTE — Progress Notes (Signed)
PROGRESS NOTE  Subjective:   This 68 year old male has a history of coronary artery disease. He presented with a subarachnoid hemorrhage and subdural hematoma in June following an injury on the roof. At the time he was found to have a cardiomyopathy with a low ejection fraction. He had his subdural treated and eventually had cardiac catheterization the end of July that showed moderate disease in the LAD and moderate to severe circumflex disease with a tight right coronary artery stenosis. At the time he had placement of a 3.5 x 20 mm drug-eluting stent to the right coronary artery that was dilated to 4.0 mm and he was placed on dual antiplatelet therapy with aspirin and BRILINTA. He had done well and had completed cardiac rehabilitation. His left ventricular function improved and his last EF was 50-55%. He noted that he slipped and had a fall about a week ago but did not have syncope. He recalls being struck in the head at a basketball game.He presented with speech difficulties and some transient aphasia and was found to have subdural hematomas that were initially managed with observation; however, symptoms worsened and surgical evacuation was performed. He presently denies CP or dyspnea. Had 1 min "indigestion" after eating yesterday.  Objective:    Vital Signs:   Temp:  [97.6 F (36.4 C)-98.1 F (36.7 C)] 97.9 F (36.6 C) (12/03 0759) Pulse Rate:  [51-64] 55 (12/03 0759) Resp:  [10-14] 11 (12/03 0800) BP: (101-117)/(46-62) 105/53 mmHg (12/03 0915) SpO2:  [93 %-98 %] 97 % (12/03 0800)  Last BM Date: 09/12/14   24-hour weight change: Weight change:   Weight trends: Filed Weights   09/06/14 2310 09/12/14 1744  Weight: 225 lb 8.5 oz (102.3 kg) 225 lb 8.5 oz (102.3 kg)    Intake/Output:  12/02 0701 - 12/03 0700 In: 680 [P.O.:680] Out: 8 [Drains:8] Total I/O In: 100 [P.O.:100] Out: 0    Physical Exam: BP 105/53 mmHg  Pulse 55  Temp(Src) 97.9 F (36.6 C) (Oral)   Resp 11  Ht 6\' 1"  (1.854 m)  Wt 225 lb 8.5 oz (102.3 kg)  BMI 29.76 kg/m2  SpO2 97%  Wt Readings from Last 3 Encounters:  09/12/14 225 lb 8.5 oz (102.3 kg)  07/28/14 234 lb 12.8 oz (106.505 kg)  07/26/14 235 lb 8 oz (106.822 kg)    General: Vital signs reviewed and noted.   Head: S/p craniotomy for SDH        Neck:  supple  Lungs:     Clear   Heart:   RRR  Abdomen:  Soft, non-tender, non-distended    Extremities: No edema    Neurologic: Dysarthria and weakness RUE; improving        Medications:    Infusions:    Scheduled Medications: . aspirin  81 mg Oral Daily  . atorvastatin  80 mg Oral q1800  . docusate sodium  100 mg Oral BID  . levETIRAcetam  1,000 mg Oral BID  . levothyroxine  100 mcg Oral QAC breakfast  . linagliptin  5 mg Oral Daily  . lisinopril  2.5 mg Oral Daily  . metFORMIN  1,000 mg Oral BID WC  . pantoprazole  40 mg Oral Daily  . pioglitazone  45 mg Oral Daily  . pneumococcal 13-valent conjugate vaccine  0.5 mL Intramuscular Tomorrow-1000  . senna  1 tablet Oral BID    Assessment/ Plan:   Active Problems:   Diabetes mellitus type 2, noninsulin dependent   Hypertensive heart  disease   Seizure   Coronary atherosclerosis of native coronary artery   SDH (subdural hematoma)   Subdural hematoma  1. CAD - s/p stenting with a DES 4 months ago. Brilinta has been stopped. ASA has been resumed. Remains in sinus.  2. SAH/SDH-s/p evacuation; management per neurosurgery.  3. Hyperlipidemia-continue statin.  Kirk Ruths, MD, [facility] 09/15/2014, 10:06 AM

## 2014-09-15 NOTE — Progress Notes (Signed)
NEURO HOSPITALIST PROGRESS NOTE   SUBJECTIVE:                                                                                                                        Post-op craniotomy and left hematoma evacuation.Doing well, continues to have transient episodes of speech difficulty and RUE paresthesias but feels well overall.   Currently on keppra 1000mg  BID.   History: Allen Myers had an episode of right arm weakness, speech difficulty and right face tingling without alteration of consciousness lasting for about one hour this morning. STAT CT brain showed that left SDH is unchanged in size and midline shift remains the same. He just returned from CT and is having another event in which he is weak in the right arm and can not even use the right hand to eat his food.     OBJECTIVE:                                                                                                                           Vital signs in last 24 hours: Temp:  [97.6 F (36.4 C)-98.1 F (36.7 C)] 97.9 F (36.6 C) (12/03 0759) Pulse Rate:  [51-64] 55 (12/03 0759) Resp:  [10-14] 12 (12/03 0759) BP: (101-131)/(46-71) 117/62 mmHg (12/03 0759) SpO2:  [93 %-98 %] 97 % (12/03 0759)  Intake/Output from previous day: 12/02 0701 - 12/03 0700 In: 32 [P.O.:680] Out: 8 [Drains:8] Intake/Output this shift:   Nutritional status: Diet - low sodium heart healthy Diet Carb Modified  Past Medical History  Diagnosis Date  . Hyperlipidemia   . History of colon polyps   . History of diverticulitis   . Erectile dysfunction   . HYPERTENSION   . SDH (subdural hematoma)     Following syncopal episode  . SAH (subarachnoid hemorrhage)     Following syncopal episode  . Kidney stones     "passed it"  . Sleep apnea in adult   . Hypothyroidism   . Depression   . Type II diabetes mellitus   . Seizures     just once a few weeks ago" (05/11/2014)  Physical exam: pleasant male in no  apparent distress. Head: normocephalic. Neck: supple, no bruits, no JVD. Cardiac: no  murmurs. Lungs: clear. Abdomen: soft, no tender, no mass. Extremities: no edema.  Neurologic Exam:  General: Mental Status: Alert, oriented, thought content appropriate.Mild dysarthria with minimal expressive aphasia. Able to follow 3 step commands without difficulty. Cranial Nerves: II:  Visual fields grossly normal, pupils equal, round, reactive to light and accommodation III,IV, VI: ptosis not present, extra-ocular motions intact bilaterally V,VII: smile symmetric, facial light touch sensation normal bilaterally VIII: hearing normal bilaterally Motor: Improved strength/dexterity in RUE, continued minimal weakness distal RUE Tone and bulk:normal tone throughout; no atrophy noted Sensory: Pinprick and light touch intact throughout, bilaterally Plantars: Right: downgoingLeft: downgoing Cerebellar: normal finger-to-nose, normal heel-to-shin test Gait:  No ataxia.   Lab Results: Lab Results  Component Value Date/Time   CHOL 77 07/13/2014 07:36 AM   Lipid Panel No results for input(s): CHOL, TRIG, HDL, CHOLHDL, VLDL, LDLCALC in the last 72 hours.  Studies/Results: No results found.  MEDICATIONS                                                                                                                        Scheduled: . aspirin  81 mg Oral Daily  . atorvastatin  80 mg Oral q1800  . docusate sodium  100 mg Oral BID  . levETIRAcetam  1,000 mg Oral BID  . levothyroxine  100 mcg Oral QAC breakfast  . linagliptin  5 mg Oral Daily  . lisinopril  2.5 mg Oral Daily  . metFORMIN  1,000 mg Oral BID WC  . pantoprazole (PROTONIX) IV  40 mg Intravenous QHS  . pioglitazone  45 mg Oral Daily  . pneumococcal 13-valent conjugate vaccine  0.5 mL Intramuscular Tomorrow-1000  . senna  1 tablet Oral BID    ASSESSMENT/PLAN:                                                                                                            68 y/o with subacute bilateral SDH. Having continuous fluctuating aphasia and RUE weakness. S/P craniotomy and drainage of hematoma on 09/12/2014. Note good improvement in speech and strength of RUE. Would continue keppra at current dose.    -continue keppra 1000mg  BID -per neurosurgery will restart ASA today -will continue to follow -will have speech therapy evaluate patient   [name], DO [facility] [phone]  If 7pm- 7am, please page neurology on call as listed in [place].   09/15/2014, 8:25 AM

## 2014-09-16 LAB — GLUCOSE, CAPILLARY
GLUCOSE-CAPILLARY: 108 mg/dL — AB (ref 70–99)
GLUCOSE-CAPILLARY: 92 mg/dL (ref 70–99)
Glucose-Capillary: 137 mg/dL — ABNORMAL HIGH (ref 70–99)

## 2014-09-16 MED ORDER — HYDROCODONE-ACETAMINOPHEN 5-325 MG PO TABS: 1.0000 | ORAL_TABLET | ORAL | Status: DC | PRN

## 2014-09-16 MED ORDER — LEVETIRACETAM 1000 MG PO TABS: 1000.0000 mg | ORAL_TABLET | Freq: Two times a day (BID) | ORAL | Status: DC

## 2014-09-16 NOTE — Progress Notes (Signed)
Chart reviewed. No NCM needs identified. Elizabeht Suto RN CCM Case Mgmt phone [phone] 

## 2014-09-16 NOTE — Progress Notes (Signed)
NEURO HOSPITALIST PROGRESS NOTE   SUBJECTIVE:                                                                                                                        Post-op craniotomy and left hematoma evacuation.Doing well, continues to have transient episodes of speech difficulty and RUE paresthesias but feels well overall.   Currently on keppra 1000mg  BID.   History: Mr. Loya had an episode of right arm weakness, speech difficulty and right face tingling without alteration of consciousness lasting for about one hour this morning. STAT CT brain showed that left SDH is unchanged in size and midline shift remains the same. He just returned from CT and is having another event in which he is weak in the right arm and can not even use the right hand to eat his food.     OBJECTIVE:                                                                                                                           Vital signs in last 24 hours: Temp:  [97.5 F (36.4 C)-98.4 F (36.9 C)] 98.4 F (36.9 C) (12/04 0500) Pulse Rate:  [57-66] 66 (12/04 0500) Resp:  [16] 16 (12/04 0500) BP: (105-112)/(53-63) 112/62 mmHg (12/04 0500) SpO2:  [96 %-98 %] 98 % (12/04 0500)  Intake/Output from previous day: 12/03 0701 - 12/04 0700 In: 100 [P.O.:100] Out: 0  Intake/Output this shift:   Nutritional status: Diet - low sodium heart healthy Diet Carb Modified  Past Medical History  Diagnosis Date  . Hyperlipidemia   . History of colon polyps   . History of diverticulitis   . Erectile dysfunction   . HYPERTENSION   . SDH (subdural hematoma)     Following syncopal episode  . SAH (subarachnoid hemorrhage)     Following syncopal episode  . Kidney stones     "passed it"  . Sleep apnea in adult   . Hypothyroidism   . Depression   . Type II diabetes mellitus   . Seizures     just once a few weeks ago" (05/11/2014)  Physical exam: pleasant male in no apparent  distress. Head: normocephalic. Neck: supple, no bruits, no JVD. Cardiac: no  murmurs. Lungs: clear. Abdomen: soft, no tender, no mass. Extremities: no edema.  Neurologic Exam:  General: Mental Status: Alert, oriented, thought content appropriate.Mild dysarthria with minimal expressive aphasia. Able to follow 3 step commands without difficulty. Cranial Nerves: II:  Visual fields grossly normal, pupils equal, round, reactive to light and accommodation III,IV, VI: ptosis not present, extra-ocular motions intact bilaterally V,VII: smile symmetric, facial light touch sensation normal bilaterally VIII: hearing normal bilaterally Motor: Improved strength/dexterity in RUE, continued minimal weakness distal RUE Tone and bulk:normal tone throughout; no atrophy noted Sensory: Pinprick and light touch intact throughout, bilaterally Plantars: Right: downgoingLeft: downgoing Cerebellar: normal finger-to-nose, normal heel-to-shin test Gait:  No ataxia.   Lab Results: Lab Results  Component Value Date/Time   CHOL 77 07/13/2014 07:36 AM   Lipid Panel No results for input(s): CHOL, TRIG, HDL, CHOLHDL, VLDL, LDLCALC in the last 72 hours.  Studies/Results: No results found.  MEDICATIONS                                                                                                                        Scheduled: . aspirin  81 mg Oral Daily  . atorvastatin  80 mg Oral q1800  . docusate sodium  100 mg Oral BID  . levETIRAcetam  1,000 mg Oral BID  . levothyroxine  100 mcg Oral QAC breakfast  . linagliptin  5 mg Oral Daily  . lisinopril  2.5 mg Oral Daily  . metFORMIN  1,000 mg Oral BID WC  . pantoprazole  40 mg Oral Daily  . pioglitazone  45 mg Oral Daily  . pneumococcal 13-valent conjugate vaccine  0.5 mL Intramuscular Tomorrow-1000  . senna  1 tablet Oral BID    ASSESSMENT/PLAN:                                                                                                            68 y/o with subacute bilateral SDH. Having continuous fluctuating aphasia and RUE weakness. S/P craniotomy and drainage of hematoma on 09/12/2014. Note good improvement in speech and strength of RUE. Would continue keppra at current dose.    -continue keppra 1000mg  BID -per neurosurgery back on ASA 81mg  daily -Follow up with Dr Leonie Man at [facility] in  2 to 4 weeks. Continue keppra pending follow up with outpatient neurology -will have speech therapy evaluate patient -no further inpatient neurological workup at this time. Please call with further questions   [name], DO [facility] [phone]  If 7pm- 7am, please page neurology on call as listed in [place].  09/16/2014, 8:47 AM

## 2014-09-16 NOTE — Discharge Summary (Signed)
Physician Discharge Summary  Patient ID: MARQUIZE SEIB MRN: [id number] DOB/AGE: [birth year] 68 y.o.  Admit date: 09/06/2014 Discharge date: 09/16/2014  Admission Diagnoses: Left Subdural Hematoma with aphasia and hemiparesis   Discharge Diagnoses: Left Subdural Hematoma with aphasia and hemiparesis s/p CRANIOTOMY HEMATOMA EVACUATION SUBDURAL (Left)  Active Problems:   Diabetes mellitus type 2, noninsulin dependent   Hypertensive heart disease   Seizure   Coronary atherosclerosis of native coronary artery   SDH (subdural hematoma)   Subdural hematoma   Discharged Condition: good  Hospital Course: Allen Myers was admitted through the ED after presenting with speech difficulties and facial numbness after a fall a week prior. CT revealed subdural hematoma on the left. Pt underwent craniotomy for SDH 74/2/59, uncomplicated, and recovered nicely. He has been followed by neurology and cardiology as well. He has improved steadily. Brilinta remains on hold.   Consults: cardiology, neurology  Significant Diagnostic Studies: radiology: CT scan: pre- & post-op  Treatments: surgery: CRANIOTOMY HEMATOMA EVACUATION SUBDURAL (Left)   Discharge Exam: Blood pressure 112/62, pulse 66, temperature 98.4 F (36.9 C), temperature source Oral, resp. rate 16, height 6\' 1"  (1.854 m), weight 102.3 kg (225 lb 8.5 oz), SpO2 98 %. Up in chair talking with family.  Speech improved even today. Pt reports a brief episode of right side facial numbness/tingling and right hand numbness/tingling last night. PEARL.  Strength remains good BUE, hand intrinsics. Incisions without erythema, swelling, drainage. Staples secure.    Disposition: 01-Home or Self Care Pt and family verbalize understanding of plan to continue Keppra 1000mg  BID, Baby aspirin 1/day, Norco 5/325 prn pain, and continuing to [employer]. He will call office to schedule 2 week f/u for staple removal & 1 month post-op CT scan with office visit to  review.    Discharge Instructions    Diet - low sodium heart healthy    Complete by:  As directed      Increase activity slowly    Complete by:  As directed             Medication List    STOP taking these medications        aspirin EC 81 MG tablet     lisinopril 2.5 MG tablet  Commonly known as:  PRINIVIL,ZESTRIL     ticagrelor 90 MG Tabs tablet  Commonly known as:  BRILINTA      TAKE these medications        acyclovir 200 MG capsule  Commonly known as:  ZOVIRAX  Take 1 capsule (200 mg total) by mouth 5 (five) times daily. For 5 days     atorvastatin 80 MG tablet  Commonly known as:  LIPITOR  Take 1 tablet (80 mg total) by mouth daily.     carvedilol 3.125 MG tablet  Commonly known as:  COREG  Take 1 tablet (3.125 mg total) by mouth 2 (two) times daily with a meal.     glucose blood test strip  Commonly known as:  ONE TOUCH TEST STRIPS  Use as directed twice daily to check blood sugar.  Diagnosis code 250.00     HYDROcodone-acetaminophen 5-325 MG per tablet  Commonly known as:  NORCO/VICODIN  Take 1-2 tablets by mouth every 4 (four) hours as needed for moderate pain.     levETIRAcetam 750 MG tablet  Commonly known as:  KEPPRA  Take 1 tablet (750 mg total) by mouth 2 (two) times daily.     levETIRAcetam 1000 MG tablet  Commonly known as:  KEPPRA  Take 1 tablet (1,000 mg total) by mouth 2 (two) times daily.     levothyroxine 100 MCG tablet  Commonly known as:  SYNTHROID, LEVOTHROID  Take 1 tablet (100 mcg total) by mouth daily before breakfast.     linagliptin 5 MG Tabs tablet  Commonly known as:  TRADJENTA  Take 5 mg by mouth daily.     metFORMIN 1000 MG tablet  Commonly known as:  GLUCOPHAGE  Take 1 tablet (1,000 mg total) by mouth 2 (two) times daily with a meal.     nitroGLYCERIN 0.4 MG SL tablet  Commonly known as:  NITROSTAT  Place 1 tablet (0.4 mg total) under the tongue every 5 (five) minutes as needed for chest pain.     pioglitazone 45  MG tablet  Commonly known as:  ACTOS  Take 1 tablet (45 mg total) by mouth daily.           Follow-up Information    Follow up with Cathlean Cower, MD. Schedule an appointment as soon as possible for a visit in 1 month.   Specialties:  Internal Medicine, Radiology   Contact information:   [place] [address] [phone]       Follow up with SETHI,PRAMOD, MD. Schedule an appointment as soon as possible for a visit in 1 week.   Specialties:  Neurology, Radiology   Contact information:   [address] [place] [address] [phone]       Follow up with Kirk Ruths, MD. Schedule an appointment as soon as possible for a visit in 1 month.   Specialty:  Cardiology   Contact information:   [address] [place] [address] [phone]       Signed: Verdis Prime 09/16/2014, 9:13 AM

## 2014-09-16 NOTE — Evaluation (Signed)
Speech Language Pathology Evaluation Patient Details Name: Allen Myers MRN: [id number] DOB: September 01, 1946 Today's Date: 09/16/2014 Time: 0350-0938 SLP Time Calculation (min) (ACUTE ONLY): 23 min  Problem List:  Patient Active Problem List   Diagnosis Date Noted  . Subdural hematoma 09/12/2014  . SDH (subdural hematoma) 09/06/2014  . Traumatic subdural hemorrhage with loss of consciousness 05/16/2014  . Coronary atherosclerosis of native coronary artery 05/11/2014  . Abdominal aortic aneurysm 04/21/2014  . Obesity (BMI 30.0-34.9) 04/04/2014  . Seizure 04/02/2014  . Syncope 04/02/2014  . SAH (subarachnoid hemorrhage) 04/01/2014  . OSA (obstructive sleep apnea) 07/22/2013  . Hypertensive heart disease   . History of kidney stones   . Diabetes mellitus type 2, noninsulin dependent   . Hypothyroidism 05/07/2007   Past Medical History:  Past Medical History  Diagnosis Date  . Hyperlipidemia   . History of colon polyps   . History of diverticulitis   . Erectile dysfunction   . HYPERTENSION   . SDH (subdural hematoma)     Following syncopal episode  . SAH (subarachnoid hemorrhage)     Following syncopal episode  . Kidney stones     "passed it"  . Sleep apnea in adult   . Hypothyroidism   . Depression   . Type II diabetes mellitus   . Seizures     just once a few weeks ago" (05/11/2014)   Past Surgical History:  Past Surgical History  Procedure Laterality Date  . Colonoscopy    . Polypectomy    . Coronary angioplasty with stent placement  05/11/2014    "1"  . Craniotomy Left 09/12/2014    Procedure: CRANIOTOMY HEMATOMA EVACUATION SUBDURAL;  Surgeon: Erline Levine, MD;  Location: [facility];  Service: Neurosurgery;  Laterality: Left;   HPI:  68 y/o admitted with new onset transient confusion associated with language impairment for approximately 30 minutes. CT brain with new bilateral and parafalcine small SDH. Was to be D/C'd a couple of days ago but developed increased  issues with use of RUE and increased speech issues-. Underwent craniotomy for L SDH 11/30.    Assessment / Plan / Recommendation Clinical Impression  Pt presents with impaired motor speech, characterized by slow rate and discoordinated movement of his articulators for alternating speech sounds. Recommend outpatient SLP services to maximize communication for this patient who works in a field that is highly dependent on communication.     SLP Assessment  All further Speech Lanaguage Pathology  needs can be addressed in the next venue of care    Follow Up Recommendations  Outpatient SLP    Frequency and Duration        Pertinent Vitals/Pain Pain Assessment: No/denies pain   SLP Goals  Patient/Family Stated Goal: thinks his speech is getting better, but wants tokeepprogressing as he uses communication for work  SLP Evaluation Prior Functioning  Cognitive/Linguistic Baseline: Within functional limits Type of Home: House  Lives With: Spouse;Other (Comment) (grandchild) Available Help at Discharge: Family;Available 24 hours/day 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)   Cognition  Overall Cognitive Status: Within Functional Limits for tasks assessed Orientation Level: Oriented X4    Comprehension  Auditory Comprehension Overall Auditory Comprehension: Appears within functional limits for tasks assessed Visual Recognition/Discrimination Discrimination: Within Function Limits Reading Comprehension Reading Status: Within funtional limits    Expression Expression Primary Mode of Expression: Verbal Verbal Expression Overall Verbal Expression: Appears within functional limits for tasks assessed Written Expression Written Expression: Not tested   Oral / Motor  Motor Speech Overall Motor Speech: Impaired Respiration: Within functional limits Phonation: Other (comment) (mild rough quality) Resonance: Within functional limits Articulation: Impaired Level of Impairment:  Conversation Intelligibility: Intelligible   GO      Germain Osgood, M.A. CCC-SLP [phone]  Germain Osgood 09/16/2014, 1:16 PM

## 2014-09-16 NOTE — Progress Notes (Signed)
Subjective: Patient reports "I had one episode last night...the only one in 36 hours...numbness in my hand and cheek...didn't last very long"  Objective: Vital signs in last 24 hours: Temp:  [97.5 F (36.4 C)-98.4 F (36.9 C)] 98.4 F (36.9 C) (12/04 0500) Pulse Rate:  [57-66] 66 (12/04 0500) Resp:  [16] 16 (12/04 0500) BP: (105-112)/(53-63) 112/62 mmHg (12/04 0500) SpO2:  [96 %-98 %] 98 % (12/04 0500)  Intake/Output from previous day: 12/03 0701 - 12/04 0700 In: 100 [P.O.:100] Out: 0  Intake/Output this shift:    Up in chair talking with family.  Speech improved even today. Pt reports a brief episode of right side facial numbness/tingling and right hand numbness/tingling last night. PEARL.  Strength remains good BUE, hand intrinsics. Incisions without erythema, swelling, drainage. Staples secure.   Lab Results: No results for input(s): WBC, HGB, HCT, PLT in the last 72 hours. BMET No results for input(s): NA, K, CL, CO2, GLUCOSE, BUN, CREATININE, CALCIUM in the last 72 hours.  Studies/Results: No results found.  Assessment/Plan: Improved   LOS: 10 days  Ok to d/c to home per DrStern. Pt and family verbalize understanding of plan to continue Keppra 1000mg  BID, Baby aspirin 1/day, Norco 5/325 prn pain, and continuing to hold Brilinta. He will call office to schedule 2 week f/u for staple removal & 1 month post-op CT scan with office visit to review.    Verdis Prime 09/16/2014, 9:02 AM

## 2014-09-16 NOTE — Progress Notes (Signed)
Discharge orders received, pt for discharge home today, IV D/C with staples OTA L side of head..  D/C instructions and Rx given with verbalized understanding.  Family at bedside to assist pt with discharge. Pt walked downstairs with RN and wife.

## 2014-09-16 NOTE — Plan of Care (Signed)
Problem: Phase III Progression Outcomes Goal: Activity at appropriate level-compared to baseline (UP IN CHAIR FOR HEMODIALYSIS)  Outcome: Completed/Met Date Met:  09/16/14 Goal: Encourage independence in ADLs Outcome: Completed/Met Date Met:  09/16/14 Goal: Tolerating diet Outcome: Completed/Met Date Met:  09/16/14 Goal: Discharge plan remains appropriate-arrangements made Outcome: Completed/Met Date Met:  09/16/14

## 2014-09-19 ENCOUNTER — Telehealth: Payer: Self-pay | Admitting: Neurology

## 2014-09-19 ENCOUNTER — Encounter (HOSPITAL_COMMUNITY): Payer: Self-pay

## 2014-09-19 LAB — GLUCOSE, CAPILLARY: Glucose-Capillary: 104 mg/dL — ABNORMAL HIGH (ref 70–99)

## 2014-09-19 NOTE — Telephone Encounter (Signed)
Called patient and left voice message to call office back.

## 2014-09-19 NOTE — Telephone Encounter (Signed)
Request Dr Erlinda Hong to see next avalible.

## 2014-09-19 NOTE — Telephone Encounter (Signed)
Spoke with patient and informed him that Dr Leonie Man is out of the office, patient states that per Dr Kirk Ruths patient will need to be seen within a week, patient also states that he is needing a speech and physical evaluation from Neuro rehab. Please advise

## 2014-09-19 NOTE — Telephone Encounter (Signed)
Patient stated he needs a 1 week  appointment to discuss getting back on blood thinner after Brain bleed surgery last week.  Please call and advise.

## 2014-09-19 NOTE — Telephone Encounter (Signed)
Patient returning Allen Myers, [place] call.  Please return call on cell # [phone].

## 2014-09-20 ENCOUNTER — Encounter (HOSPITAL_COMMUNITY): Payer: Self-pay

## 2014-09-20 NOTE — Telephone Encounter (Signed)
Dr Erlinda Hong, Dr Leonie Man has requested for you to see his patient, you have no available openings next week, please advise.

## 2014-09-21 NOTE — Telephone Encounter (Signed)
Patient returning Allen Myers's call.  Please call mobile # [phone].

## 2014-09-22 ENCOUNTER — Encounter (HOSPITAL_COMMUNITY): Payer: Self-pay | Admitting: Interventional Cardiology

## 2014-09-22 ENCOUNTER — Encounter (HOSPITAL_COMMUNITY): Payer: Self-pay

## 2014-09-22 NOTE — Telephone Encounter (Signed)
thanks

## 2014-09-22 NOTE — Telephone Encounter (Signed)
Hi, Tashia, please put her on Monday 09/26/14 11:30am. Thanks.  Rosalin Hawking, MD PhD Stroke Neurology 09/22/2014 12:27 AM

## 2014-09-22 NOTE — Telephone Encounter (Signed)
Called patient and scheduled appointment for this Monday 12/14 at 11:30 am.

## 2014-09-26 ENCOUNTER — Encounter: Payer: Self-pay | Admitting: Neurology

## 2014-09-26 ENCOUNTER — Ambulatory Visit (INDEPENDENT_AMBULATORY_CARE_PROVIDER_SITE_OTHER): Payer: Managed Care, Other (non HMO) | Admitting: Neurology

## 2014-09-26 ENCOUNTER — Encounter (HOSPITAL_COMMUNITY): Payer: Self-pay

## 2014-09-26 VITALS — BP 95/62 | HR 60 | Ht 73.0 in | Wt 239.0 lb

## 2014-09-26 DIAGNOSIS — I609 Nontraumatic subarachnoid hemorrhage, unspecified: Secondary | ICD-10-CM

## 2014-09-26 DIAGNOSIS — S065XAA Traumatic subdural hemorrhage with loss of consciousness status unknown, initial encounter: Secondary | ICD-10-CM

## 2014-09-26 DIAGNOSIS — S065X9A Traumatic subdural hemorrhage with loss of consciousness of unspecified duration, initial encounter: Secondary | ICD-10-CM

## 2014-09-26 DIAGNOSIS — R569 Unspecified convulsions: Secondary | ICD-10-CM

## 2014-09-26 DIAGNOSIS — I6200 Nontraumatic subdural hemorrhage, unspecified: Secondary | ICD-10-CM

## 2014-09-26 NOTE — Patient Instructions (Signed)
-   continue ASA 81mg  for cardiac prevention for now - will repeat CT head to evaluate SDH - will consider plavix alone if blood is absorbed on CT head - Follow up with your primary care physician for stroke risk factor modification. Recommend maintain blood pressure goal <130/80, diabetes with hemoglobin A1c goal below 6.5% and lipids with LDL cholesterol goal below 70 mg/dL.  - follow up with cardiology and PCP as scheduled - gradually increase exercise to the point you feel comfortable - continue keppra 1000mg  twice a day - According to [place] law, you can not drive until seizure free for 6 months and under physician's care.  - Please maintain seizure precautions. Do not participate in activities where a loss of awareness could hurt you or someone else.  No swimming alone, no tub bathing, no hot tubs, no driving, no operating motorized vehicles(cars, ATVs, motorbikes, etc), lawnmowers or power tools.  No standing at heights, such as rooftops, ladders or stairs. For children, no sliding boards, monkey bars or swings, or climbing trees.  Avoid hot objects such as stoves, heaters, open fires.  Wear a helmet when riding a bicycle, scooter, skateboard, etc. and avoid areas of traffic.  Set your water heater to 120 degrees or less. - follow up in 2 months.

## 2014-09-27 ENCOUNTER — Encounter (HOSPITAL_COMMUNITY): Payer: Self-pay

## 2014-09-27 NOTE — Progress Notes (Signed)
STROKE NEUROLOGY FOLLOW UP NOTE  NAME: Allen Myers DOB: August 14, 1946  REASON FOR VISIT: stroke follow up HISTORY FROM: chart  Today we had the pleasure of seeing Allen Myers in follow-up at our Neurology Clinic. Pt was accompanied by wife.   History Summary 68 year Caucasian male who had episode of brief loss of consciousness on 04/01/14. He was raking some leaves on the roof of his house when he states that he felt dizzy and lost balance and fell down and hit his head. He must have lost consciousness for a while. His wife called his neighbor who climbed on the roof and noticed that the patient was all pale and bluish and had some jerking. He was found to have multiple rib fractures and L1 transverse process fracture. CT scan of the head showed a small right convexity subdural hematoma and superficial left temporoparietal occipital subarachnoid hemorrhage. He was treated conservatively and remained stable. He subsequently had a witnessed generalized tonic-clonic seizure the next after admission and required temporary intubation for her for protection. He was started on Keppra and had no further seizures during hospitalization. Followup CT scan of the brain showed stable appearance. Followup CT scan done on 04/28/14 showed complete resolution of the tiny subdural and somewhat subarachnoid bleeds. He states is done well and has had no further seizures. His headaches have resolved and he has not taken any headache medications for more than 2-3 weeks. He has no focal neurological symptoms or deficits. He does however complain of orthostatic dizziness and mild orthostasis symptoms when he stands up. He denies gait imbalance, double vision or vertigo. He has not had any prior history of strokes or TIAs. He seems to be tolerating Keppra 500 twice daily well without major side effects. He had cardiac catheterization done by Dr. Daneen Schick and had a stent placed. He is currently on aspirin 81 mg.   Follow  up 07/28/14 (PS) - He has not had any recurrent seizures. He has been tolerating Keppra quite well without any side effects. He states his orthostatic dizziness symptoms are much improved and probably intermittently. He does do orthostatic problems exercises. He underwent followup MRI scan of the brain on 05/28/14 shows only a thin rim of resolving subdural hematoma over the right convexity. MRA of the brain shows no large vessel stenosis, occlusion or aneurysms . EEG done on 05/17/14 was normal without epileptiform activity noted. Keppra was continued.  Interval History During the interval time, the patient had a fall mid Nov. One week after the fall, he was admitted to [facility] 09/06/14 due to speech difficulties and right cheek numbness. It comes and goes and episodic. Pt was brought to ED and CT showed left hemispheric SDH. At the beginning it not very frequent and lasting 90min but as time goes by, the episodes more frequent and lasting longer up to 64min. His ASA and brilinta were discontinued and his keppar was increased to 1000mg  bid. Repeat CT showed slight enlargement of SDH, and pt continued with aphasia and right arm weakness episodes. Therefore, left SDH evacuation was done on 09/12/14 by NSG. Pt condition getting better and symptoms resolved. He was discharged on 09/16/14 with resumption of baby ASA.   He had cardiac stent in July and since then he was on ASA and brilinta. Due to the SDH, he is currently not on brilinta. A early referral was requested for management of antiplatelet. He is on ASA 81mg  now.  REVIEW OF SYSTEMS: Full 14 system  review of systems performed and notable only for those listed below and in HPI above, all others are negative:  Constitutional: N/A  Cardiovascular: N/A  Ear/Nose/Throat: N/A  Skin: N/A  Eyes: N/A  Respiratory: N/A  Gastroitestinal: N/A  Genitourinary: N/A Hematology/Lymphatic: N/A  Endocrine: N/A  Musculoskeletal: N/A  Allergy/Immunology: N/A    Neurological: N/A  Psychiatric: N/A  The following represents the patient's updated allergies and side effects list: No Known Allergies  The neurologically relevant items on the patient's problem list were reviewed on today's visit.  Neurologic Examination  A problem focused neurological exam (12 or more points of the single system neurologic examination, vital signs counts as 1 point, cranial nerves count for 8 points) was performed.  Blood pressure 95/62, pulse 60, height 6\' 1"  (1.854 m), weight 239 lb (108.41 kg).  General - Well nourished, well developed, in no apparent distress.  Ophthalmologic - not able to see through.  Cardiovascular - Regular rate and rhythm with no murmur.  Mental Status -  Level of arousal and orientation to time, place, and person were intact. Language including expression, naming, repetition, comprehension was assessed and found intact.  Cranial Nerves II - XII - II - Visual field intact OU. III, IV, VI - Extraocular movements intact. V - Facial sensation intact bilaterally. VII - Facial movement intact bilaterally. VIII - Hearing & vestibular intact bilaterally. X - Palate elevates symmetrically. XI - Chin turning & shoulder shrug intact bilaterally. XII - Tongue protrusion intact.  Motor Strength - The patient's strength was normal in all extremities and pronator drift was absent.  Bulk was normal and fasciculations were absent.   Motor Tone - Muscle tone was assessed at the neck and appendages and was normal.  Reflexes - The patient's reflexes were normal in all extremities and he had no pathological reflexes.  Sensory - Light touch, temperature/pinprick, vibration and proprioception, and Romberg testing were assessed and were normal.    Coordination - The patient had normal movements in the hands and feet with no ataxia or dysmetria.  Tremor was absent.  Gait and Station - The patient's transfers, posture, gait, station, and turns were  observed as normal.  Data reviewed: I personally reviewed the images and agree with the radiology interpretations.  Ct Head Wo Contrast  09/11/2014  IMPRESSION: Bilateral subdural hematomas are stable in size. Continued liquefaction of left-sided subdural hematoma. 6 mm midline shift to the right is stable.  09/10/2014   IMPRESSION: Left-sided subdural hematoma slightly larger. No definite repeat hemorrhage is identified. 1 mm more of midline shift to the right compared with the prior study.  Small right frontal subdural hematoma unchanged. Interhemispheric subdural hematoma slightly improved.      09/07/2014   IMPRESSION: Bilateral subdural hematomas unchanged. Mild midline shift to the right unchanged. No new hemorrhage.     09/06/2014   IMPRESSION: 1. Bilateral and parafalcine acute subdural hematomas are identified. 2. Small vessel ischemic change and brain atrophy.    Mri and Mra Head Wo Contrast  09/09/2014   IMPRESSION: Subacute subdural hematomas bilaterally are stable, measuring up to 11 mm in thickness on the left. No new hemorrhage.  No acute infarct  Negative MRA head.    2D echo 07/19/14 - - Normal biventricular size and function. Moderate concentric LVH with abnormal relaxation and normal filling pressures. Mild tricuspid regurgitation. Normal RVSP.  EEG 09/07/14 - Normal electroencephalogram, awake, asleep and with activation  procedures. There are no focal lateralizing or epileptiform features.  Assessment: As you may recall, he is a 68 y.o. Caucasian male with PMH of CAD s/p stent in 04/2014 on ASA and brilinta, right SDH s/p fall with seizure in 03/2014 put on keppra was admitted again in 08/2014 for left SDH s/p fall. Had slight enlargement of left SDH over days and frequent focal seizure with aphasia and right cheek numbness. Increased keppra dose to 1000mg  bid. Had SDH evacuation on 09/12/14. Discharged with ASA 81mg  but brilinta stopped. He came in today for hospital  follow up.  Plan:  - repeat CT head to evaluate SDH - continue ASA 81mg  now but if SDH resolved on CT, will start plavix for cardiac prevention with stent. - continue keppra 1000mg  bid for now. - According to [place] law, you can not drive until seizure free for 6 months and under physician's care.  - Please maintain seizure precautions.  - Follow up with your primary care physician for stroke risk factor modification. Recommend maintain blood pressure goal <130/80, diabetes with hemoglobin A1c goal below 6.5% and lipids with LDL cholesterol goal below 70 mg/dL.  - RTC in 2 months.   Orders Placed This Encounter  Procedures  . CT Head Wo Contrast    Standing Status: Future     Number of Occurrences:      Standing Expiration Date: 12/26/2015    Order Specific Question:  Reason for Exam (SYMPTOM  OR DIAGNOSIS REQUIRED)    Answer:  SDH follow up    Order Specific Question:  Preferred imaging location?    Answer:  Internal    Meds ordered this encounter  Medications  . lisinopril (PRINIVIL,ZESTRIL) 2.5 MG tablet    Sig: Take 2.5 mg by mouth daily.    Patient Instructions  - continue ASA 81mg  for cardiac prevention for now - will repeat CT head to evaluate SDH - will consider plavix alone if blood is absorbed on CT head - Follow up with your primary care physician for stroke risk factor modification. Recommend maintain blood pressure goal <130/80, diabetes with hemoglobin A1c goal below 6.5% and lipids with LDL cholesterol goal below 70 mg/dL.  - follow up with cardiology and PCP as scheduled - gradually increase exercise to the point you feel comfortable - continue keppra 1000mg  twice a day - According to [place] law, you can not drive until seizure free for 6 months and under physician's care.  - Please maintain seizure precautions. Do not participate in activities where a loss of awareness could hurt you or someone else.  No swimming alone, no tub bathing, no hot tubs, no driving, no  operating motorized vehicles(cars, ATVs, motorbikes, etc), lawnmowers or power tools.  No standing at heights, such as rooftops, ladders or stairs. For children, no sliding boards, monkey bars or swings, or climbing trees.  Avoid hot objects such as stoves, heaters, open fires.  Wear a helmet when riding a bicycle, scooter, skateboard, etc. and avoid areas of traffic.  Set your water heater to 120 degrees or less. - follow up in 2 months.     Rosalin Hawking, MD PhD [facility] [address] [place], [place] [address] [phone]

## 2014-09-29 ENCOUNTER — Encounter (HOSPITAL_COMMUNITY): Payer: Self-pay

## 2014-09-29 ENCOUNTER — Ambulatory Visit
Admission: RE | Admit: 2014-09-29 | Discharge: 2014-09-29 | Disposition: A | Discharge status: Home / Self Care | Payer: Managed Care, Other (non HMO) | Source: Ambulatory Visit | Attending: Neurology | Admitting: Neurology

## 2014-09-29 DIAGNOSIS — I609 Nontraumatic subarachnoid hemorrhage, unspecified: Secondary | ICD-10-CM

## 2014-09-29 DIAGNOSIS — I6200 Nontraumatic subdural hemorrhage, unspecified: Secondary | ICD-10-CM

## 2014-09-30 ENCOUNTER — Other Ambulatory Visit (INDEPENDENT_AMBULATORY_CARE_PROVIDER_SITE_OTHER): Payer: Managed Care, Other (non HMO)

## 2014-09-30 ENCOUNTER — Encounter: Payer: Self-pay | Admitting: Cardiology

## 2014-09-30 ENCOUNTER — Ambulatory Visit (INDEPENDENT_AMBULATORY_CARE_PROVIDER_SITE_OTHER): Payer: Managed Care, Other (non HMO) | Admitting: Cardiology

## 2014-09-30 VITALS — BP 120/60 | HR 60 | Ht 73.0 in | Wt 232.0 lb

## 2014-09-30 DIAGNOSIS — S065X9A Traumatic subdural hemorrhage with loss of consciousness of unspecified duration, initial encounter: Secondary | ICD-10-CM

## 2014-09-30 DIAGNOSIS — I2510 Atherosclerotic heart disease of native coronary artery without angina pectoris: Secondary | ICD-10-CM | POA: Diagnosis not present

## 2014-09-30 DIAGNOSIS — I6200 Nontraumatic subdural hemorrhage, unspecified: Secondary | ICD-10-CM | POA: Diagnosis not present

## 2014-09-30 DIAGNOSIS — I639 Cerebral infarction, unspecified: Secondary | ICD-10-CM

## 2014-09-30 DIAGNOSIS — E119 Type 2 diabetes mellitus without complications: Secondary | ICD-10-CM | POA: Diagnosis not present

## 2014-09-30 DIAGNOSIS — S065XAA Traumatic subdural hemorrhage with loss of consciousness status unknown, initial encounter: Secondary | ICD-10-CM

## 2014-09-30 DIAGNOSIS — I119 Hypertensive heart disease without heart failure: Secondary | ICD-10-CM | POA: Diagnosis not present

## 2014-09-30 LAB — HEPATIC FUNCTION PANEL
ALBUMIN: 3.6 g/dL (ref 3.5–5.2)
ALK PHOS: 86 U/L (ref 39–117)
ALT: 19 U/L (ref 0–53)
AST: 19 U/L (ref 0–37)
BILIRUBIN TOTAL: 0.6 mg/dL (ref 0.2–1.2)
Bilirubin, Direct: 0.1 mg/dL (ref 0.0–0.3)
Total Protein: 6.7 g/dL (ref 6.0–8.3)

## 2014-09-30 LAB — LIPID PANEL
CHOL/HDL RATIO: 3
Cholesterol: 89 mg/dL (ref 0–200)
HDL: 32.8 mg/dL — ABNORMAL LOW (ref >39.00)
LDL CALC: 46 mg/dL (ref 0–99)
NONHDL: 56.2
Triglycerides: 51 mg/dL (ref 0.0–149.0)
VLDL: 10.2 mg/dL (ref 0.0–40.0)

## 2014-09-30 LAB — BASIC METABOLIC PANEL
BUN: 14 mg/dL (ref 6–23)
CO2: 27 mEq/L (ref 19–32)
Calcium: 8.7 mg/dL (ref 8.4–10.5)
Chloride: 104 mEq/L (ref 96–112)
Creatinine, Ser: 0.9 mg/dL (ref 0.4–1.5)
GFR: 93.88 mL/min (ref >60.00)
Glucose, Bld: 121 mg/dL — ABNORMAL HIGH (ref 70–99)
POTASSIUM: 4.3 meq/L (ref 3.5–5.1)
SODIUM: 140 meq/L (ref 135–145)

## 2014-09-30 LAB — HEMOGLOBIN A1C: Hgb A1c MFr Bld: 6.5 % (ref 4.6–6.5)

## 2014-09-30 NOTE — Assessment & Plan Note (Signed)
04/2914/- normal LM, calcified LAD stenosis, 40-50%, 50-70% circumflex, 95% distal RCA stenosis-  Treated with RCA DES He has been off Brilinta since 09/06/14. No angina

## 2014-09-30 NOTE — Assessment & Plan Note (Signed)
After a fall in June 2015 and again 09/06/14- s/p craniotomy 09/16/14.

## 2014-09-30 NOTE — Assessment & Plan Note (Signed)
Normal LVF with moderate LVH and grade 1 diastolic dysfunction by Oct 2015 echo

## 2014-09-30 NOTE — Progress Notes (Signed)
09/30/2014 Allen Myers   14-Jun-1946  [id number]  Primary Physician Cathlean Cower, MD Primary Cardiologist: Dr Stanford Breed  HPI:  75 year Caucasian male who had episode of brief loss of consciousness on 04/01/14. He was raking some leaves on the roof of his house when he states that he felt dizzy and lost balance and fell down and hit his head. He must have lost consciousness for a while. His wife called his neighbor who climbed on the roof and noticed that the patient was all pale and bluish and had some jerking. He was found to have multiple rib fractures and L1 transverse process fracture. CT scan of the head showed a small right convexity subdural hematoma and superficial left temporoparietal occipital subarachnoid hemorrhage. During that admission he was found to have cardiomyopathy by echo. A cath, and PCI was done 05/11/14 with a DES placed to his RCA and did well after this.           The patient had a fall mid Nov. One week after the fall, he was admitted to [facility] 09/06/14 due to speech difficulties and right cheek numbness. It comes and goes and episodic. Pt was brought to ED and CT showed left hemispheric SDH.  His ASA and brilinta were discontinued and his keppar was increased to 1000mg  bid. Repeat CT showed slight enlargement of SDH, and pt continued with aphasia and right arm weakness episodes. Therefore, left SDH evacuation was done on 09/12/14 by NSG.             He is in the office today for cardiac follow up. There was consideration of starting back Plavix alone pending a follow up CT which was done yesterday. The pt has been stable since from a cardiac standpoint. He appears to have made a remarkable recovery from a neurologic standpoint.    Current Outpatient Prescriptions  Medication Sig Dispense Refill  . aspirin EC 81 MG tablet Take 81 mg by mouth daily.    Marland Kitchen atorvastatin (LIPITOR) 80 MG tablet Take 1 tablet (80 mg total) by mouth daily. 90 tablet 1  . carvedilol (COREG) 3.125 MG  tablet Take 1 tablet (3.125 mg total) by mouth 2 (two) times daily with a meal. 180 tablet 3  . glucose blood (ONE TOUCH TEST STRIPS) test strip Use as directed twice daily to check blood sugar.  Diagnosis code 250.00 100 each 11  . levETIRAcetam (KEPPRA) 1000 MG tablet Take 1 tablet (1,000 mg total) by mouth 2 (two) times daily. 60 tablet 1  . levothyroxine (SYNTHROID, LEVOTHROID) 100 MCG tablet Take 1 tablet (100 mcg total) by mouth daily before breakfast. 90 tablet 3  . linagliptin (TRADJENTA) 5 MG TABS tablet Take 5 mg by mouth daily.    Marland Kitchen lisinopril (PRINIVIL,ZESTRIL) 2.5 MG tablet Take 2.5 mg by mouth daily.    . metFORMIN (GLUCOPHAGE) 1000 MG tablet Take 1 tablet (1,000 mg total) by mouth 2 (two) times daily with a meal. 180 tablet 3  . nitroGLYCERIN (NITROSTAT) 0.4 MG SL tablet Place 1 tablet (0.4 mg total) under the tongue every 5 (five) minutes as needed for chest pain. 25 tablet 12  . pioglitazone (ACTOS) 45 MG tablet Take 1 tablet (45 mg total) by mouth daily. 90 tablet 3   Current Facility-Administered Medications  Medication Dose Route Frequency Provider Last Rate Last Dose  . pneumococcal 13-valent conjugate vaccine (PREVNAR 13) injection 0.5 mL  0.5 mL Intramuscular Tomorrow-1000 Biagio Borg, MD  No Known Allergies  History   Social History  . Marital Status: Married    Spouse Name: Allen Myers    Number of Children: 3  . Years of Education: 12   Occupational History  . 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 [employer] based in [place]   . [phone] (CELL)    Social History Main Topics  . Smoking status: Former Smoker -- 1.00 packs/day for 30 years    Types: Cigarettes    Quit date: 10/15/1983  . Smokeless tobacco: Never Used  . Alcohol Use: No  . Drug Use: No  . Sexual Activity: Yes   Other Topics Concern  . Not on file   Social History Narrative   Patient is married with 3 daughters 19, 22, 25 and 2 grandchildren.   Patient is right handed.   Patient has hs  education.   Patient drinks decaf, and soda occasionally.     Review of Systems: General: negative for chills, fever, night sweats or weight changes.  Cardiovascular: negative for chest pain, dyspnea on exertion, edema, orthopnea, palpitations, paroxysmal nocturnal dyspnea or shortness of breath Dermatological: negative for rash Respiratory: negative for cough or wheezing Urologic: negative for hematuria Abdominal: negative for nausea, vomiting, diarrhea, bright red blood per rectum, melena, or hematemesis Neurologic: negative for visual changes, syncope, or dizziness All other systems reviewed and are otherwise negative except as noted above.    Blood pressure 120/60, pulse 60, height 6\' 1"  (1.854 m), weight 232 lb (105.235 kg).  General appearance: alert, cooperative and no distress Lungs: clear to auscultation bilaterally Heart: regular rate and rhythm   ASSESSMENT AND PLAN:   SDH (subdural hematoma) After a fall in June 2015 and again 09/06/14- s/p craniotomy 09/16/14.  Coronary atherosclerosis of native coronary artery 04/2914/- normal LM, calcified LAD stenosis, 40-50%, 50-70% circumflex, 95% distal RCA stenosis-  Treated with RCA DES He has been off Brilinta since 09/06/14. No angina  Hypertensive heart disease Normal LVF with moderate LVH and grade 1 diastolic dysfunction by Oct 2015 echo  Diabetes mellitus type 2, noninsulin dependent .   PLAN  Discussed with Dr Stanford Breed today in the office. At this point we feel the pt should just remain on ASA 81 mg daily- No Plavix. We will see him in f/u in 3 months.   Amauris Debois KPA-C 09/30/2014 5:00 PM

## 2014-09-30 NOTE — Patient Instructions (Signed)
Kerin Ransom, PA-C, recommends that you schedule a follow-up appointment in 3 months with Dr Stanford Breed. You will receive a reminder letter in the mail two months in advance. If you don't receive a letter, please call our office to schedule the follow-up appointment.

## 2014-10-03 ENCOUNTER — Encounter (HOSPITAL_COMMUNITY): Payer: Self-pay

## 2014-10-04 ENCOUNTER — Telehealth: Payer: Self-pay | Admitting: *Deleted

## 2014-10-04 ENCOUNTER — Encounter (HOSPITAL_COMMUNITY): Payer: Self-pay

## 2014-10-04 ENCOUNTER — Ambulatory Visit (INDEPENDENT_AMBULATORY_CARE_PROVIDER_SITE_OTHER): Payer: Managed Care, Other (non HMO) | Admitting: Internal Medicine

## 2014-10-04 ENCOUNTER — Encounter: Payer: Self-pay | Admitting: Internal Medicine

## 2014-10-04 VITALS — BP 110/62 | HR 59 | Temp 97.7°F | Ht 73.0 in | Wt 232.5 lb

## 2014-10-04 DIAGNOSIS — Z0189 Encounter for other specified special examinations: Secondary | ICD-10-CM | POA: Diagnosis not present

## 2014-10-04 DIAGNOSIS — I119 Hypertensive heart disease without heart failure: Secondary | ICD-10-CM

## 2014-10-04 DIAGNOSIS — E785 Hyperlipidemia, unspecified: Secondary | ICD-10-CM | POA: Diagnosis not present

## 2014-10-04 DIAGNOSIS — I639 Cerebral infarction, unspecified: Secondary | ICD-10-CM

## 2014-10-04 DIAGNOSIS — Z0000 Encounter for general adult medical examination without abnormal findings: Secondary | ICD-10-CM

## 2014-10-04 DIAGNOSIS — E119 Type 2 diabetes mellitus without complications: Secondary | ICD-10-CM

## 2014-10-04 MED ORDER — PIOGLITAZONE HCL 30 MG PO TABS: 30.0000 mg | ORAL_TABLET | Freq: Every day | ORAL | Status: DC

## 2014-10-04 NOTE — Patient Instructions (Signed)
OK to decrease the actos to 30 mg per day  Please continue all other medications as before, and refills have been done if requested.  Please have the pharmacy call with any other refills you may need.  Please continue your efforts at being more active, low cholesterol diet, and weight control.  Please keep your appointments with your specialists as you may have planned  Please return in 6 months, or sooner if needed, with Lab testing done 3-5 days before

## 2014-10-04 NOTE — Assessment & Plan Note (Signed)
stable overall by history and exam, recent data reviewed with pt, and pt to continue medical treatment as before,  to f/u any worsening symptoms or concerns  Lab Results  Component Value Date   LDLCALC 46 09/30/2014

## 2014-10-04 NOTE — Telephone Encounter (Signed)
Discussed with pt over the phone. Informed him about CT results which showed improvement of SDH but blood not completely absorbed yet. Will keep him on ASA for now and on next clinic visit will consider switch to plavix. See Dr. Jacalyn Lefevre note, he also recommend pt on ASA for now. Pt expressed understanding and appreciation.  Rosalin Hawking, MD PhD Stroke Neurology 10/04/2014 4:15 PM

## 2014-10-04 NOTE — Telephone Encounter (Signed)
Spoke with patient's wife and r/s appointment to 11/23/14 at 11:15 am. Patient's wife were requesting CT results, which are in the chart but are abnormal. Dr Erlinda Hong please call patient about results at [phone]

## 2014-10-04 NOTE — Progress Notes (Signed)
Pre visit review using our clinic review tool, if applicable. No additional management support is needed unless otherwise documented below in the visit note. 

## 2014-10-04 NOTE — Assessment & Plan Note (Addendum)
Recent better control, to decr the actos to 30 mg qd, o/w stable overall by history and exam, recent data reviewed with pt, and pt to continue medical treatment as before,  to f/u any worsening symptoms or concerns Lab Results  Component Value Date   HGBA1C 6.5 09/30/2014

## 2014-10-04 NOTE — Progress Notes (Signed)
Subjective:    Patient ID: Allen Myers, male    DOB: 01-Feb-1946, 68 y.o.   MRN: [id number]  HPI  Here to f/u; overall doing ok,  Pt denies chest pain, increased sob or doe, wheezing, orthopnea, PND, increased LE swelling, palpitations, dizziness or syncope.  Pt denies polydipsia, polyuria, or low sugar symptoms such as weakness or confusion improved with po intake.  Pt denies new neurological symptoms such as new headache, or facial or extremity weakness or numbness.   Pt states overall good compliance with meds, has been trying to follow lower cholesterol, diabetic diet, with wt overall stable,  but little exercise however.  S/p recent SDH with aphasia and right hemiparesis, now improved. Wt down from start approx 265, now down to 232 with illness and now trying to lose more wt. Plans to go back to work jan 10 Past Medical History  Diagnosis Date  . Hyperlipidemia   . History of colon polyps   . History of diverticulitis   . Erectile dysfunction   . HYPERTENSION   . SDH (subdural hematoma)     Following syncopal episode  . SAH (subarachnoid hemorrhage)     Following syncopal episode  . Kidney stones     "passed it"  . Sleep apnea in adult   . Hypothyroidism   . Depression   . Type II diabetes mellitus   . Seizures     just once a few weeks ago" (05/11/2014)   Past Surgical History  Procedure Laterality Date  . Colonoscopy    . Polypectomy    . Coronary angioplasty with stent placement  05/11/2014    "1"  . Craniotomy Left 09/12/2014    Procedure: CRANIOTOMY HEMATOMA EVACUATION SUBDURAL;  Surgeon: Erline Levine, MD;  Location: [place] [facility];  Service: Neurosurgery;  Laterality: Left;  . Left heart catheterization with coronary angiogram N/A 05/11/2014    Procedure: LEFT HEART CATHETERIZATION WITH CORONARY ANGIOGRAM;  Surgeon: [name], MD;  Location: [facility] CATH LAB;  Service: Cardiovascular;  Laterality: N/A;  . Fractional flow reserve wire  05/11/2014    Procedure:  FRACTIONAL FLOW RESERVE WIRE;  Surgeon: [name], MD;  Location: [facility] CATH LAB;  Service: Cardiovascular;;    reports that he quit smoking about 30 years ago. His smoking use included Cigarettes. He has a 30 pack-year smoking history. He has never used smokeless tobacco. He reports that he does not drink alcohol or use illicit drugs. family history includes Cancer in his brother and cousin; Heart disease in his father and mother; Lung cancer in his father. There is no history of Colon cancer, Rectal cancer, or Stomach cancer. No Known Allergies Current Outpatient Prescriptions on File Prior to Visit  Medication Sig Dispense Refill  . aspirin EC 81 MG tablet Take 81 mg by mouth daily.    Marland Kitchen atorvastatin (LIPITOR) 80 MG tablet Take 1 tablet (80 mg total) by mouth daily. 90 tablet 1  . carvedilol (COREG) 3.125 MG tablet Take 1 tablet (3.125 mg total) by mouth 2 (two) times daily with a meal. 180 tablet 3  . glucose blood (ONE TOUCH TEST STRIPS) test strip Use as directed twice daily to check blood sugar.  Diagnosis code 250.00 100 each 11  . levETIRAcetam (KEPPRA) 1000 MG tablet Take 1 tablet (1,000 mg total) by mouth 2 (two) times daily. 60 tablet 1  . levothyroxine (SYNTHROID, LEVOTHROID) 100 MCG tablet Take 1 tablet (100 mcg total) by mouth daily before breakfast.  90 tablet 3  . linagliptin (TRADJENTA) 5 MG TABS tablet Take 5 mg by mouth daily.    Marland Kitchen lisinopril (PRINIVIL,ZESTRIL) 2.5 MG tablet Take 2.5 mg by mouth daily.    . metFORMIN (GLUCOPHAGE) 1000 MG tablet Take 1 tablet (1,000 mg total) by mouth 2 (two) times daily with a meal. 180 tablet 3  . nitroGLYCERIN (NITROSTAT) 0.4 MG SL tablet Place 1 tablet (0.4 mg total) under the tongue every 5 (five) minutes as needed for chest pain. 25 tablet 12   Current Facility-Administered Medications on File Prior to Visit  Medication Dose Route Frequency Provider Last Rate Last Dose  . pneumococcal 13-valent conjugate vaccine (PREVNAR 13) injection  0.5 mL  0.5 mL Intramuscular Tomorrow-1000 Biagio Borg, MD       Review of Systems  Constitutional: Negative for unusual diaphoresis or other sweats  HENT: Negative for ringing in ear Eyes: Negative for double vision or worsening visual disturbance.  Respiratory: Negative for choking and stridor.   Gastrointestinal: Negative for vomiting or other signifcant bowel change Genitourinary: Negative for hematuria or decreased urine volume.  Musculoskeletal: Negative for other MSK pain or swelling Skin: Negative for color change and worsening wound.  Neurological: Negative for tremors and numbness other than noted  Psychiatric/Behavioral: Negative for decreased concentration or agitation other than above       Objective:   Physical Exam BP 110/62 mmHg  Pulse 59  Temp(Src) 97.7 F (36.5 C) (Oral)  Ht 6\' 1"  (1.854 m)  Wt 232 lb 8 oz (105.461 kg)  BMI 30.68 kg/m2  SpO2 97% VS noted,  Constitutional: Pt appears well-developed, well-nourished.  HENT: Head: NCAT.  Right Ear: External ear normal.  Left Ear: External ear normal.  Eyes: . Pupils are equal, round, and reactive to light. Conjunctivae and EOM are normal Neck: Normal range of motion. Neck supple.  Cardiovascular: Normal rate and regular rhythm.   Pulmonary/Chest: Effort normal and breath sounds normal.  Neurological: Pt is alert. Not confused , motor grossly intact Skin: Skin is warm. No rash Psychiatric: Pt behavior is normal. No agitation.  Wt Readings from Last 3 Encounters:  10/04/14 232 lb 8 oz (105.461 kg)  09/30/14 232 lb (105.235 kg)  09/26/14 239 lb (108.41 kg)       Assessment & Plan:

## 2014-10-04 NOTE — Addendum Note (Signed)
Addended by: Sharon Seller B on: 10/04/2014 10:11 AM   Modules accepted: Orders

## 2014-10-04 NOTE — Assessment & Plan Note (Signed)
stable overall by history and exam, recent data reviewed with pt, and pt to continue medical treatment as before,  to f/u any worsening symptoms or concerns BP Readings from Last 3 Encounters:  10/04/14 110/62  09/30/14 120/60  09/26/14 95/62

## 2014-10-06 ENCOUNTER — Encounter (HOSPITAL_COMMUNITY): Payer: Self-pay

## 2014-10-10 ENCOUNTER — Encounter (HOSPITAL_COMMUNITY): Payer: Self-pay

## 2014-10-11 ENCOUNTER — Telehealth: Payer: Self-pay | Admitting: Cardiology

## 2014-10-11 ENCOUNTER — Encounter (HOSPITAL_COMMUNITY): Payer: Self-pay

## 2014-10-11 NOTE — Telephone Encounter (Signed)
We will get Dr. Stanford Breed to sign release form and fax it back to cardiac rehab on the 5th of Jan.

## 2014-10-11 NOTE — Telephone Encounter (Signed)
Pt wants to get back in  Cardiac Rehab,would you please help enroll him in this asap.

## 2014-10-13 ENCOUNTER — Encounter (HOSPITAL_COMMUNITY): Payer: Self-pay

## 2014-10-17 ENCOUNTER — Telehealth: Payer: Self-pay | Admitting: Cardiology

## 2014-10-17 ENCOUNTER — Encounter (HOSPITAL_COMMUNITY): Payer: Self-pay

## 2014-10-17 NOTE — Telephone Encounter (Signed)
Left message for patient, aware I have the paperwork for dr Stanford Breed to sign tomorrow when he is back in the office.

## 2014-10-17 NOTE — Telephone Encounter (Signed)
Pt called in wanting to f/u with our office to see if Dr. Stanford Breed received paper work from Card[facility] so that he may continue with the program. Please call  Thanks

## 2014-10-18 ENCOUNTER — Encounter (HOSPITAL_COMMUNITY): Payer: Self-pay

## 2014-10-19 ENCOUNTER — Other Ambulatory Visit: Payer: Self-pay

## 2014-10-19 MED ORDER — LISINOPRIL 2.5 MG PO TABS: 2.5000 mg | ORAL_TABLET | Freq: Every day | ORAL | Status: DC

## 2014-10-20 ENCOUNTER — Encounter (HOSPITAL_COMMUNITY): Payer: Self-pay

## 2014-10-24 ENCOUNTER — Encounter (HOSPITAL_COMMUNITY): Payer: Self-pay

## 2014-10-24 ENCOUNTER — Encounter (HOSPITAL_COMMUNITY)
Admission: RE | Admit: 2014-10-24 | Discharge: 2014-10-24 | Disposition: A | Discharge status: Home / Self Care | Payer: Managed Care, Other (non HMO) | Source: Ambulatory Visit | Attending: Cardiology | Admitting: Cardiology

## 2014-10-24 DIAGNOSIS — I714 Abdominal aortic aneurysm, without rupture: Secondary | ICD-10-CM | POA: Insufficient documentation

## 2014-10-24 DIAGNOSIS — Z5189 Encounter for other specified aftercare: Secondary | ICD-10-CM | POA: Insufficient documentation

## 2014-10-24 DIAGNOSIS — I4891 Unspecified atrial fibrillation: Secondary | ICD-10-CM | POA: Diagnosis not present

## 2014-10-24 DIAGNOSIS — I252 Old myocardial infarction: Secondary | ICD-10-CM | POA: Diagnosis not present

## 2014-10-24 DIAGNOSIS — I2510 Atherosclerotic heart disease of native coronary artery without angina pectoris: Secondary | ICD-10-CM | POA: Insufficient documentation

## 2014-10-24 DIAGNOSIS — I429 Cardiomyopathy, unspecified: Secondary | ICD-10-CM | POA: Diagnosis not present

## 2014-10-25 ENCOUNTER — Encounter (HOSPITAL_COMMUNITY): Payer: Self-pay

## 2014-10-25 ENCOUNTER — Encounter (HOSPITAL_COMMUNITY)
Admission: RE | Admit: 2014-10-25 | Discharge: 2014-10-25 | Disposition: A | Discharge status: Home / Self Care | Payer: Managed Care, Other (non HMO) | Source: Ambulatory Visit | Attending: Cardiology | Admitting: Cardiology

## 2014-10-26 ENCOUNTER — Ambulatory Visit: Payer: Managed Care, Other (non HMO) | Admitting: Internal Medicine

## 2014-10-27 ENCOUNTER — Encounter (HOSPITAL_COMMUNITY): Payer: Self-pay

## 2014-10-27 ENCOUNTER — Encounter (HOSPITAL_COMMUNITY)
Admission: RE | Admit: 2014-10-27 | Discharge: 2014-10-27 | Disposition: A | Discharge status: Home / Self Care | Payer: Managed Care, Other (non HMO) | Source: Ambulatory Visit | Attending: Cardiology | Admitting: Cardiology

## 2014-10-31 ENCOUNTER — Encounter (HOSPITAL_COMMUNITY): Payer: Self-pay

## 2014-11-01 ENCOUNTER — Encounter (HOSPITAL_COMMUNITY): Payer: Self-pay

## 2014-11-01 ENCOUNTER — Encounter (HOSPITAL_COMMUNITY)
Admission: RE | Admit: 2014-11-01 | Discharge: 2014-11-01 | Disposition: A | Discharge status: Home / Self Care | Payer: Managed Care, Other (non HMO) | Source: Ambulatory Visit | Attending: Cardiology | Admitting: Cardiology

## 2014-11-01 ENCOUNTER — Other Ambulatory Visit: Payer: Self-pay

## 2014-11-01 MED ORDER — LISINOPRIL 2.5 MG PO TABS: 2.5000 mg | ORAL_TABLET | Freq: Every day | ORAL | Status: DC

## 2014-11-02 ENCOUNTER — Encounter (HOSPITAL_COMMUNITY)
Admission: RE | Admit: 2014-11-02 | Discharge: 2014-11-02 | Disposition: A | Discharge status: Home / Self Care | Payer: Managed Care, Other (non HMO) | Source: Ambulatory Visit | Attending: Cardiology | Admitting: Cardiology

## 2014-11-03 ENCOUNTER — Encounter (HOSPITAL_COMMUNITY): Payer: Self-pay

## 2014-11-03 ENCOUNTER — Encounter (HOSPITAL_COMMUNITY): Payer: Managed Care, Other (non HMO)

## 2014-11-07 ENCOUNTER — Encounter (HOSPITAL_COMMUNITY): Payer: Self-pay

## 2014-11-07 ENCOUNTER — Encounter (HOSPITAL_COMMUNITY)
Admission: RE | Admit: 2014-11-07 | Discharge: 2014-11-07 | Disposition: A | Discharge status: Home / Self Care | Payer: Managed Care, Other (non HMO) | Source: Ambulatory Visit | Attending: Cardiology | Admitting: Cardiology

## 2014-11-08 ENCOUNTER — Encounter (HOSPITAL_COMMUNITY): Payer: Self-pay

## 2014-11-08 ENCOUNTER — Encounter (HOSPITAL_COMMUNITY)
Admission: RE | Admit: 2014-11-08 | Discharge: 2014-11-08 | Disposition: A | Discharge status: Home / Self Care | Payer: Managed Care, Other (non HMO) | Source: Ambulatory Visit | Attending: Cardiology | Admitting: Cardiology

## 2014-11-10 ENCOUNTER — Encounter (HOSPITAL_COMMUNITY): Payer: Self-pay

## 2014-11-10 ENCOUNTER — Encounter (HOSPITAL_COMMUNITY): Payer: Managed Care, Other (non HMO)

## 2014-11-11 ENCOUNTER — Telehealth: Payer: Self-pay | Admitting: Cardiology

## 2014-11-11 NOTE — Telephone Encounter (Signed)
Pt need a new prescription for his Lisinopril. Please call to [place].

## 2014-11-11 NOTE — Telephone Encounter (Signed)
This was sent in 11/01/14 for #90 with 1 refill.

## 2014-11-14 ENCOUNTER — Encounter (HOSPITAL_COMMUNITY)
Admission: RE | Admit: 2014-11-14 | Discharge: 2014-11-14 | Disposition: A | Discharge status: Home / Self Care | Payer: Managed Care, Other (non HMO) | Source: Ambulatory Visit | Attending: Cardiology | Admitting: Cardiology

## 2014-11-14 ENCOUNTER — Encounter (HOSPITAL_COMMUNITY): Payer: Managed Care, Other (non HMO)

## 2014-11-14 ENCOUNTER — Encounter (HOSPITAL_COMMUNITY): Payer: Self-pay

## 2014-11-14 DIAGNOSIS — I2510 Atherosclerotic heart disease of native coronary artery without angina pectoris: Secondary | ICD-10-CM | POA: Diagnosis not present

## 2014-11-14 DIAGNOSIS — I429 Cardiomyopathy, unspecified: Secondary | ICD-10-CM | POA: Diagnosis not present

## 2014-11-14 DIAGNOSIS — I252 Old myocardial infarction: Secondary | ICD-10-CM | POA: Diagnosis not present

## 2014-11-14 DIAGNOSIS — I714 Abdominal aortic aneurysm, without rupture: Secondary | ICD-10-CM | POA: Diagnosis not present

## 2014-11-14 DIAGNOSIS — I4891 Unspecified atrial fibrillation: Secondary | ICD-10-CM | POA: Insufficient documentation

## 2014-11-14 DIAGNOSIS — Z5189 Encounter for other specified aftercare: Secondary | ICD-10-CM | POA: Insufficient documentation

## 2014-11-15 ENCOUNTER — Encounter (HOSPITAL_COMMUNITY): Payer: Managed Care, Other (non HMO)

## 2014-11-15 ENCOUNTER — Encounter (HOSPITAL_COMMUNITY): Payer: Self-pay

## 2014-11-17 ENCOUNTER — Encounter (HOSPITAL_COMMUNITY): Payer: Managed Care, Other (non HMO)

## 2014-11-17 ENCOUNTER — Encounter (HOSPITAL_COMMUNITY): Payer: Self-pay

## 2014-11-17 ENCOUNTER — Encounter (HOSPITAL_COMMUNITY)
Admission: RE | Admit: 2014-11-17 | Discharge: 2014-11-17 | Disposition: A | Discharge status: Home / Self Care | Payer: Managed Care, Other (non HMO) | Source: Ambulatory Visit | Attending: Cardiology | Admitting: Cardiology

## 2014-11-21 ENCOUNTER — Encounter (HOSPITAL_COMMUNITY): Payer: Self-pay

## 2014-11-21 ENCOUNTER — Encounter (HOSPITAL_COMMUNITY)
Admission: RE | Admit: 2014-11-21 | Discharge: 2014-11-21 | Disposition: A | Discharge status: Home / Self Care | Payer: Managed Care, Other (non HMO) | Source: Ambulatory Visit | Attending: Cardiology | Admitting: Cardiology

## 2014-11-21 ENCOUNTER — Encounter (HOSPITAL_COMMUNITY): Payer: Managed Care, Other (non HMO)

## 2014-11-22 ENCOUNTER — Encounter (HOSPITAL_COMMUNITY): Payer: Self-pay

## 2014-11-22 ENCOUNTER — Encounter (HOSPITAL_COMMUNITY)
Admission: RE | Admit: 2014-11-22 | Discharge: 2014-11-22 | Disposition: A | Discharge status: Home / Self Care | Payer: Managed Care, Other (non HMO) | Source: Ambulatory Visit | Attending: Cardiology | Admitting: Cardiology

## 2014-11-22 ENCOUNTER — Encounter (HOSPITAL_COMMUNITY): Payer: Managed Care, Other (non HMO)

## 2014-11-23 ENCOUNTER — Ambulatory Visit (INDEPENDENT_AMBULATORY_CARE_PROVIDER_SITE_OTHER): Payer: Managed Care, Other (non HMO) | Admitting: Neurology

## 2014-11-23 ENCOUNTER — Encounter: Payer: Self-pay | Admitting: Neurology

## 2014-11-23 VITALS — BP 96/62 | HR 66 | Ht 73.0 in | Wt 236.6 lb

## 2014-11-23 DIAGNOSIS — E785 Hyperlipidemia, unspecified: Secondary | ICD-10-CM

## 2014-11-23 DIAGNOSIS — S065X9A Traumatic subdural hemorrhage with loss of consciousness of unspecified duration, initial encounter: Secondary | ICD-10-CM

## 2014-11-23 DIAGNOSIS — I10 Essential (primary) hypertension: Secondary | ICD-10-CM

## 2014-11-23 DIAGNOSIS — I6200 Nontraumatic subdural hemorrhage, unspecified: Secondary | ICD-10-CM

## 2014-11-23 DIAGNOSIS — R569 Unspecified convulsions: Secondary | ICD-10-CM

## 2014-11-23 DIAGNOSIS — S065XAA Traumatic subdural hemorrhage with loss of consciousness status unknown, initial encounter: Secondary | ICD-10-CM

## 2014-11-23 NOTE — Patient Instructions (Signed)
-   continue ASA 81 mg for stroke prevention - continue lipitor for stroke prevention - continue keppra 1000mg  for seizure control - Follow up with your primary care physician for stroke risk factor modification. Recommend maintain blood pressure goal <130/80, diabetes with hemoglobin A1c goal below 6.5% and lipids with LDL cholesterol goal below 70 mg/dL.  - check BP and glucose at home - According to [facility] law, you can not drive until seizure free for 6 months and under physician's care.  - Please maintain seizure precautions. Do not participate in activities where a loss of awareness could hurt you or someone else.  No swimming alone, no tub bathing, no hot tubs, no driving, no operating motorized vehicles(cars, ATVs, motorbikes, etc), lawnmowers or power tools.  No standing at heights, such as rooftops, ladders or stairs. No sliding boards, monkey bars or swings, or climbing trees.  Avoid hot objects such as stoves, heaters, open fires.  Wear a helmet when riding a bicycle, scooter, skateboard, etc. and avoid areas of traffic.  Set water heater to 120 degrees or less. - follow up in 4 months

## 2014-11-23 NOTE — Progress Notes (Signed)
STROKE NEUROLOGY FOLLOW UP NOTE  NAME: MAXTEN SHULER DOB: [birth year]  REASON FOR VISIT: stroke follow up HISTORY FROM: chart  Today we had the pleasure of seeing CASON LUFFMAN in follow-up at our Neurology Clinic. Pt was accompanied by wife.   History Summary 69 year Caucasian male who had episode of brief loss of consciousness on 04/01/14. He was raking some leaves on the roof of his house when he states that he felt dizzy and lost balance and fell down and hit his head. He must have lost consciousness for a while. His wife called his neighbor who climbed on the roof and noticed that the patient was all pale and bluish and had some jerking. He was found to have multiple rib fractures and L1 transverse process fracture. CT scan of the head showed a small right convexity subdural hematoma and superficial left temporoparietal occipital subarachnoid hemorrhage. He was treated conservatively and remained stable. He subsequently had a witnessed generalized tonic-clonic seizure the next after admission and required temporary intubation for her for protection. He was started on Keppra and had no further seizures during hospitalization. Followup CT scan of the brain showed stable appearance. Followup CT scan done on 04/28/14 showed complete resolution of the tiny subdural and somewhat subarachnoid bleeds. He states is done well and has had no further seizures. His headaches have resolved and he has not taken any headache medications for more than 2-3 weeks. He has no focal neurological symptoms or deficits. He does however complain of orthostatic dizziness and mild orthostasis symptoms when he stands up. He denies gait imbalance, double vision or vertigo. He has not had any prior history of strokes or TIAs. He seems to be tolerating Keppra 500 twice daily well without major side effects. He had cardiac catheterization done by Dr. Daneen Schick and had a stent placed. He is currently on aspirin 81 mg.   Follow  up 07/28/14 (PS) - He has not had any recurrent seizures. He has been tolerating Keppra quite well without any side effects. He states his orthostatic dizziness symptoms are much improved and probably intermittently. He does do orthostatic problems exercises. He underwent followup MRI scan of the brain on 05/28/14 shows only a thin rim of resolving subdural hematoma over the right convexity. MRA of the brain shows no large vessel stenosis, occlusion or aneurysms . EEG done on 05/17/14 was normal without epileptiform activity noted. Keppra was continued.  Follow up 09/26/14 - the patient had a fall mid Nov. One week after the fall, he was admitted to [facility] 09/06/14 due to speech difficulties and right cheek numbness. It comes and goes and episodic. Pt was brought to ED and CT showed left hemispheric SDH. At the beginning it not very frequent and lasting 20min but as time goes by, the episodes more frequent and lasting longer up to 47min. His ASA and brilinta were discontinued and his keppar was increased to 1000mg  bid. Repeat CT showed slight enlargement of SDH, and pt continued with aphasia and right arm weakness episodes. Therefore, left SDH evacuation was done on 09/12/14 by NSG. Pt condition getting better and symptoms resolved. He was discharged on 09/16/14 with resumption of baby ASA.  He had cardiac stent in July and since then he was on ASA and brilinta. Due to the SDH, he is currently not on brilinta. A early referral was requested for management of antiplatelet. He is on ASA 81mg  now.  Interval History During the interval time, he was doing  well. No seizures and no neurological changes. He is on cardio rehab and he feels great. He had CT repeat showed evolving left SDH but not completely resolved. He followed with Dr. Stanford Breed in cardiology and OK with just on ASA 81mg  for CAD. He also saw NSG for follow up and was told in good shape. He continued on baby ASA and lipitor and keppra. He admit that he did  drive a couple of time short distance to grocery store. He was again instructed not to drive until seizure free 6 months. His BP 96/62 in clinic today but he stated that is his normal BP at home. He stated that his glucose controlled well at home, glucose 100-120s.  REVIEW OF SYSTEMS: Full 14 system review of systems performed and notable only for those listed below and in HPI above, all others are negative:  Constitutional: N/A  Cardiovascular: N/A  Ear/Nose/Throat: Runny nose  Skin: rash  Eyes: N/A  Respiratory: N/A  Gastroitestinal: N/A  Genitourinary: N/A Hematology/Lymphatic: N/A  Endocrine: N/A  Musculoskeletal: Neck pain  Allergy/Immunology: N/A  Neurological: N/A  Psychiatric: frequent waking, snoring  The following represents the patient's updated allergies and side effects list: No Known Allergies  The neurologically relevant items on the patient's problem list were reviewed on today's visit.  Neurologic Examination  A problem focused neurological exam (12 or more points of the single system neurologic examination, vital signs counts as 1 point, cranial nerves count for 8 points) was performed.  Blood pressure 96/62, pulse 66, height 6\' 1"  (1.854 m), weight 236 lb 9.6 oz (107.321 kg).  General - Well nourished, well developed, in no apparent distress.  Ophthalmologic - not able to see through.  Cardiovascular - Regular rate and rhythm with no murmur.  Mental Status -  Level of arousal and orientation to time, place, and person were intact. Language including expression, naming, repetition, comprehension was assessed and found intact.  Cranial Nerves II - XII - II - Visual field intact OU. III, IV, VI - Extraocular movements intact. V - Facial sensation intact bilaterally. VII - Facial movement intact bilaterally. VIII - Hearing & vestibular intact bilaterally. X - Palate elevates symmetrically. XI - Chin turning & shoulder shrug intact bilaterally. XII -  Tongue protrusion intact.  Motor Strength - The patient's strength was normal in all extremities and pronator drift was absent.  Bulk was normal and fasciculations were absent.   Motor Tone - Muscle tone was assessed at the neck and appendages and was normal.  Reflexes - The patient's reflexes were normal in all extremities and he had no pathological reflexes.  Sensory - Light touch, temperature/pinprick, vibration and proprioception, and Romberg testing were assessed and were normal.    Coordination - The patient had normal movements in the hands and feet with no ataxia or dysmetria.  Tremor was absent.  Gait and Station - The patient's transfers, posture, gait, station, and turns were observed as normal.  Data reviewed: I personally reviewed the images and agree with the radiology interpretations.  Ct Head Wo Contrast  09/30/14 - Abnormal CT head (without) demonstrating: 1. Improving bifrontal and left anterior temporal subdural hematomas, with overlying left frontal craniotomy. This measures maximum of 7-41mm in thickness over the left frontal regions.  2. Compared to prior CT on 09/11/14, there has been interval left frontal craniotomy and drainage of subdural hematoma, with slight interval reduction in size of subdural hematomas with expected evolutional changes.  09/11/2014  IMPRESSION: Bilateral subdural hematomas are  stable in size. Continued liquefaction of left-sided subdural hematoma. 6 mm midline shift to the right is stable.  09/10/2014   IMPRESSION: Left-sided subdural hematoma slightly larger. No definite repeat hemorrhage is identified. 1 mm more of midline shift to the right compared with the prior study.  Small right frontal subdural hematoma unchanged. Interhemispheric subdural hematoma slightly improved.      09/07/2014   IMPRESSION: Bilateral subdural hematomas unchanged. Mild midline shift to the right unchanged. No new hemorrhage.     09/06/2014   IMPRESSION: 1.  Bilateral and parafalcine acute subdural hematomas are identified. 2. Small vessel ischemic change and brain atrophy.    Mri and Mra Head Wo Contrast  09/09/2014   IMPRESSION: Subacute subdural hematomas bilaterally are stable, measuring up to 11 mm in thickness on the left. No new hemorrhage.  No acute infarct  Negative MRA head.    2D echo 07/19/14 - - Normal biventricular size and function. Moderate concentric LVH with abnormal relaxation and normal filling pressures. Mild tricuspid regurgitation. Normal RVSP.  EEG 09/07/14 - Normal electroencephalogram, awake, asleep and with activation  procedures. There are no focal lateralizing or epileptiform features.  Component     Latest Ref Rng 09/30/2014  Cholesterol     0 - 200 mg/dL 89  Triglycerides     0.0 - 149.0 mg/dL 51.0  HDL     >39.00 mg/dL 32.80 (L)  VLDL     0.0 - 40.0 mg/dL 10.2  LDL (calc)     0 - 99 mg/dL 46  Total CHOL/HDL Ratio      3  NonHDL      56.20  Hgb A1c MFr Bld     4.6 - 6.5 % 6.5    Assessment: As you may recall, he is a 69 y.o. Caucasian male with PMH of CAD s/p stent in 04/2014 on ASA and brilinta, right SDH s/p fall with seizure in 03/2014 put on keppra was admitted again in 08/2014 for left SDH s/p fall. Had slight enlargement of left SDH over days and frequent focal seizure with aphasia and right cheek numbness. Increased keppra dose to 1000mg  bid. Had SDH evacuation on 09/12/14. Discharged with ASA 81mg  but brilinta stopped. He is clinically doing well after discharge and followed up in clinic in 09/2014, repeat CT showed evolving left SDH. Followed up with cardiology and OK with only on baby ASA. No seizures after discharge.  Plan:  - continue ASA 81mg  and lipitor for stroke and cardiac prevention. - continue keppra 1000mg  bid for seizure control. - According to [place] law, he can not drive until seizure free for 6 months and under physician's care.  - Maintain seizure precautions.  - Follow up with  your primary care physician for stroke risk factor modification. Recommend maintain blood pressure goal <130/80, diabetes with hemoglobin A1c goal below 6.5% and lipids with LDL cholesterol goal below 70 mg/dL.  - check BP and glucose at home - RTC in 4 months.    No orders of the defined types were placed in this encounter.    No orders of the defined types were placed in this encounter.    Patient Instructions  - continue ASA 81 mg for stroke prevention - continue lipitor for stroke prevention - continue keppra 1000mg  for seizure control - Follow up with your primary care physician for stroke risk factor modification. Recommend maintain blood pressure goal <130/80, diabetes with hemoglobin A1c goal below 6.5% and lipids with LDL cholesterol goal below 70  mg/dL.  - check BP and glucose at home - According to [facility] law, you can not drive until seizure free for 6 months and under physician's care.  - Please maintain seizure precautions. Do not participate in activities where a loss of awareness could hurt you or someone else.  No swimming alone, no tub bathing, no hot tubs, no driving, no operating motorized vehicles(cars, ATVs, motorbikes, etc), lawnmowers or power tools.  No standing at heights, such as rooftops, ladders or stairs. No sliding boards, monkey bars or swings, or climbing trees.  Avoid hot objects such as stoves, heaters, open fires.  Wear a helmet when riding a bicycle, scooter, skateboard, etc. and avoid areas of traffic.  Set water heater to 120 degrees or less. - follow up in 4 months     Rosalin Hawking, MD PhD [facility] [address] [place], [place] [address] [phone]

## 2014-11-24 ENCOUNTER — Encounter (HOSPITAL_COMMUNITY): Payer: Self-pay

## 2014-11-24 ENCOUNTER — Encounter (HOSPITAL_COMMUNITY): Payer: Managed Care, Other (non HMO)

## 2014-11-24 ENCOUNTER — Encounter (HOSPITAL_COMMUNITY)
Admission: RE | Admit: 2014-11-24 | Discharge: 2014-11-24 | Disposition: A | Discharge status: Home / Self Care | Payer: Managed Care, Other (non HMO) | Source: Ambulatory Visit | Attending: Cardiology | Admitting: Cardiology

## 2014-11-25 ENCOUNTER — Ambulatory Visit: Payer: Managed Care, Other (non HMO) | Admitting: Neurology

## 2014-11-28 ENCOUNTER — Encounter (HOSPITAL_COMMUNITY): Payer: Managed Care, Other (non HMO)

## 2014-11-28 ENCOUNTER — Encounter (HOSPITAL_COMMUNITY): Payer: Self-pay

## 2014-11-29 ENCOUNTER — Encounter (HOSPITAL_COMMUNITY): Payer: Self-pay

## 2014-11-29 ENCOUNTER — Encounter (HOSPITAL_COMMUNITY): Payer: Managed Care, Other (non HMO)

## 2014-12-01 ENCOUNTER — Encounter (HOSPITAL_COMMUNITY)
Admission: RE | Admit: 2014-12-01 | Discharge: 2014-12-01 | Disposition: A | Discharge status: Home / Self Care | Payer: Managed Care, Other (non HMO) | Source: Ambulatory Visit | Attending: Cardiology | Admitting: Cardiology

## 2014-12-01 ENCOUNTER — Encounter (HOSPITAL_COMMUNITY): Payer: Managed Care, Other (non HMO)

## 2014-12-01 ENCOUNTER — Encounter (HOSPITAL_COMMUNITY): Payer: Self-pay

## 2014-12-02 ENCOUNTER — Encounter (HOSPITAL_COMMUNITY)
Admission: RE | Admit: 2014-12-02 | Discharge: 2014-12-02 | Disposition: A | Discharge status: Home / Self Care | Payer: Managed Care, Other (non HMO) | Source: Ambulatory Visit | Attending: Cardiology | Admitting: Cardiology

## 2014-12-05 ENCOUNTER — Encounter (HOSPITAL_COMMUNITY): Payer: Managed Care, Other (non HMO)

## 2014-12-05 ENCOUNTER — Encounter (HOSPITAL_COMMUNITY)
Admission: RE | Admit: 2014-12-05 | Discharge: 2014-12-05 | Disposition: A | Discharge status: Home / Self Care | Payer: Managed Care, Other (non HMO) | Source: Ambulatory Visit | Attending: Cardiology | Admitting: Cardiology

## 2014-12-05 ENCOUNTER — Encounter (HOSPITAL_COMMUNITY): Payer: Self-pay

## 2014-12-06 ENCOUNTER — Encounter (HOSPITAL_COMMUNITY)
Admission: RE | Admit: 2014-12-06 | Discharge: 2014-12-06 | Disposition: A | Discharge status: Home / Self Care | Payer: Managed Care, Other (non HMO) | Source: Ambulatory Visit | Attending: Cardiology | Admitting: Cardiology

## 2014-12-06 ENCOUNTER — Encounter (HOSPITAL_COMMUNITY): Payer: Managed Care, Other (non HMO)

## 2014-12-06 ENCOUNTER — Encounter (HOSPITAL_COMMUNITY): Payer: Self-pay

## 2014-12-08 ENCOUNTER — Encounter (HOSPITAL_COMMUNITY): Payer: Self-pay

## 2014-12-08 ENCOUNTER — Encounter (HOSPITAL_COMMUNITY): Payer: Managed Care, Other (non HMO)

## 2014-12-08 ENCOUNTER — Encounter (HOSPITAL_COMMUNITY)
Admission: RE | Admit: 2014-12-08 | Discharge: 2014-12-08 | Disposition: A | Discharge status: Home / Self Care | Payer: Managed Care, Other (non HMO) | Source: Ambulatory Visit | Attending: Cardiology | Admitting: Cardiology

## 2014-12-12 ENCOUNTER — Encounter (HOSPITAL_COMMUNITY): Payer: Managed Care, Other (non HMO)

## 2014-12-12 ENCOUNTER — Encounter (HOSPITAL_COMMUNITY): Payer: Self-pay

## 2014-12-13 ENCOUNTER — Encounter (HOSPITAL_COMMUNITY): Payer: Managed Care, Other (non HMO)

## 2014-12-13 ENCOUNTER — Encounter (HOSPITAL_COMMUNITY)
Admission: RE | Admit: 2014-12-13 | Discharge: 2014-12-13 | Disposition: A | Discharge status: Home / Self Care | Payer: Managed Care, Other (non HMO) | Source: Ambulatory Visit | Attending: Cardiology | Admitting: Cardiology

## 2014-12-13 ENCOUNTER — Encounter (HOSPITAL_COMMUNITY): Payer: Self-pay

## 2014-12-13 DIAGNOSIS — I2510 Atherosclerotic heart disease of native coronary artery without angina pectoris: Secondary | ICD-10-CM | POA: Insufficient documentation

## 2014-12-13 DIAGNOSIS — I4891 Unspecified atrial fibrillation: Secondary | ICD-10-CM | POA: Insufficient documentation

## 2014-12-13 DIAGNOSIS — I252 Old myocardial infarction: Secondary | ICD-10-CM | POA: Diagnosis not present

## 2014-12-13 DIAGNOSIS — Z5189 Encounter for other specified aftercare: Secondary | ICD-10-CM | POA: Diagnosis present

## 2014-12-13 DIAGNOSIS — I429 Cardiomyopathy, unspecified: Secondary | ICD-10-CM | POA: Diagnosis not present

## 2014-12-13 DIAGNOSIS — I714 Abdominal aortic aneurysm, without rupture: Secondary | ICD-10-CM | POA: Insufficient documentation

## 2014-12-15 ENCOUNTER — Encounter (HOSPITAL_COMMUNITY): Payer: Self-pay

## 2014-12-15 ENCOUNTER — Encounter (HOSPITAL_COMMUNITY): Payer: Managed Care, Other (non HMO)

## 2014-12-15 ENCOUNTER — Encounter (HOSPITAL_COMMUNITY)
Admission: RE | Admit: 2014-12-15 | Discharge: 2014-12-15 | Disposition: A | Discharge status: Home / Self Care | Payer: Managed Care, Other (non HMO) | Source: Ambulatory Visit | Attending: Cardiology | Admitting: Cardiology

## 2014-12-19 ENCOUNTER — Encounter (HOSPITAL_COMMUNITY): Payer: Self-pay

## 2014-12-19 ENCOUNTER — Encounter (HOSPITAL_COMMUNITY): Payer: Managed Care, Other (non HMO)

## 2014-12-19 ENCOUNTER — Encounter (HOSPITAL_COMMUNITY)
Admission: RE | Admit: 2014-12-19 | Discharge: 2014-12-19 | Disposition: A | Discharge status: Home / Self Care | Payer: Managed Care, Other (non HMO) | Source: Ambulatory Visit | Attending: Cardiology | Admitting: Cardiology

## 2014-12-20 ENCOUNTER — Encounter (HOSPITAL_COMMUNITY): Payer: Managed Care, Other (non HMO)

## 2014-12-20 ENCOUNTER — Encounter (HOSPITAL_COMMUNITY): Payer: Self-pay

## 2014-12-22 ENCOUNTER — Encounter (HOSPITAL_COMMUNITY): Payer: Self-pay

## 2014-12-22 ENCOUNTER — Encounter (HOSPITAL_COMMUNITY): Payer: Managed Care, Other (non HMO)

## 2014-12-22 ENCOUNTER — Encounter (HOSPITAL_COMMUNITY)
Admission: RE | Admit: 2014-12-22 | Discharge: 2014-12-22 | Disposition: A | Discharge status: Home / Self Care | Payer: Managed Care, Other (non HMO) | Source: Ambulatory Visit | Attending: Cardiology | Admitting: Cardiology

## 2014-12-26 ENCOUNTER — Encounter (HOSPITAL_COMMUNITY): Payer: Managed Care, Other (non HMO)

## 2014-12-26 ENCOUNTER — Encounter (HOSPITAL_COMMUNITY): Payer: Self-pay

## 2014-12-27 ENCOUNTER — Encounter (HOSPITAL_COMMUNITY): Payer: Managed Care, Other (non HMO)

## 2014-12-27 ENCOUNTER — Encounter (HOSPITAL_COMMUNITY): Payer: Self-pay

## 2014-12-27 ENCOUNTER — Encounter (HOSPITAL_COMMUNITY)
Admission: RE | Admit: 2014-12-27 | Discharge: 2014-12-27 | Disposition: A | Discharge status: Home / Self Care | Payer: Managed Care, Other (non HMO) | Source: Ambulatory Visit | Attending: Cardiology | Admitting: Cardiology
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
## 2014-12-29 ENCOUNTER — Encounter (HOSPITAL_COMMUNITY): Payer: Managed Care, Other (non HMO)

## 2014-12-29 ENCOUNTER — Encounter (HOSPITAL_COMMUNITY): Payer: Self-pay

## 2014-12-29 ENCOUNTER — Encounter (HOSPITAL_COMMUNITY)
Admission: RE | Admit: 2014-12-29 | Discharge: 2014-12-29 | Disposition: A | Discharge status: Home / Self Care | Payer: Managed Care, Other (non HMO) | Source: Ambulatory Visit | Attending: Cardiology | Admitting: Cardiology

## 2014-12-30 ENCOUNTER — Encounter: Payer: Self-pay | Admitting: Cardiology

## 2014-12-30 ENCOUNTER — Encounter: Payer: Self-pay | Admitting: *Deleted

## 2014-12-30 ENCOUNTER — Encounter (HOSPITAL_COMMUNITY)
Admission: RE | Admit: 2014-12-30 | Discharge: 2014-12-30 | Disposition: A | Discharge status: Home / Self Care | Payer: Managed Care, Other (non HMO) | Source: Ambulatory Visit | Attending: Cardiology | Admitting: Cardiology

## 2014-12-30 ENCOUNTER — Ambulatory Visit (INDEPENDENT_AMBULATORY_CARE_PROVIDER_SITE_OTHER): Payer: Managed Care, Other (non HMO) | Admitting: Cardiology

## 2014-12-30 VITALS — BP 92/60 | HR 50 | Ht 73.0 in | Wt 234.0 lb

## 2014-12-30 DIAGNOSIS — I420 Dilated cardiomyopathy: Secondary | ICD-10-CM | POA: Insufficient documentation

## 2014-12-30 DIAGNOSIS — I714 Abdominal aortic aneurysm, without rupture, unspecified: Secondary | ICD-10-CM

## 2014-12-30 DIAGNOSIS — E785 Hyperlipidemia, unspecified: Secondary | ICD-10-CM | POA: Diagnosis not present

## 2014-12-30 DIAGNOSIS — I2510 Atherosclerotic heart disease of native coronary artery without angina pectoris: Secondary | ICD-10-CM

## 2014-12-30 NOTE — Patient Instructions (Signed)
Your physician wants you to follow-up in: [place] will receive a reminder letter in the mail two months in advance. If you don't receive a letter, please call our office to schedule the follow-up appointment.   Your physician has requested that you have an abdominal aorta duplex. During this test, an ultrasound is used to evaluate the aorta. Allow 30 minutes for this exam. Do not eat after midnight the day before and avoid carbonated beverages

## 2014-12-30 NOTE — Assessment & Plan Note (Signed)
Continue statin. 

## 2014-12-30 NOTE — Assessment & Plan Note (Signed)
LV function improved on most recent echocardiogram. His blood pressure is borderline but he states his systolic typically runs approximately 100. He is not having problems with dizziness. Continue present dose of carvedilol and lisinopril. If he has more significant issues with dizziness in the future we will plan to discontinue these medications.

## 2014-12-30 NOTE — Assessment & Plan Note (Signed)
Continue aspirin and statin. Given prior intracranial hemorrhages I have elected not to resume brilinta.

## 2014-12-30 NOTE — Assessment & Plan Note (Signed)
Repeat abdominal ultrasound June 2016.

## 2015-01-02 ENCOUNTER — Encounter (HOSPITAL_COMMUNITY): Payer: Self-pay

## 2015-01-02 ENCOUNTER — Encounter (HOSPITAL_COMMUNITY): Payer: Managed Care, Other (non HMO)

## 2015-01-03 ENCOUNTER — Encounter (HOSPITAL_COMMUNITY): Payer: Managed Care, Other (non HMO)

## 2015-01-03 ENCOUNTER — Encounter (HOSPITAL_COMMUNITY): Payer: Self-pay

## 2015-01-05 ENCOUNTER — Telehealth (HOSPITAL_COMMUNITY): Payer: Self-pay | Admitting: *Deleted

## 2015-01-05 ENCOUNTER — Encounter (HOSPITAL_COMMUNITY): Payer: Managed Care, Other (non HMO)

## 2015-01-05 ENCOUNTER — Encounter (HOSPITAL_COMMUNITY): Payer: Self-pay

## 2015-01-09 ENCOUNTER — Encounter (HOSPITAL_COMMUNITY): Payer: Self-pay

## 2015-01-09 ENCOUNTER — Encounter (HOSPITAL_COMMUNITY): Payer: Managed Care, Other (non HMO)

## 2015-01-10 ENCOUNTER — Encounter (HOSPITAL_COMMUNITY): Payer: Managed Care, Other (non HMO)

## 2015-01-10 ENCOUNTER — Encounter (HOSPITAL_COMMUNITY): Payer: Self-pay

## 2015-01-12 ENCOUNTER — Encounter (HOSPITAL_COMMUNITY): Payer: Managed Care, Other (non HMO)

## 2015-01-12 ENCOUNTER — Encounter (HOSPITAL_COMMUNITY): Payer: Self-pay

## 2015-01-16 ENCOUNTER — Encounter (HOSPITAL_COMMUNITY): Payer: Self-pay

## 2015-01-16 ENCOUNTER — Encounter (HOSPITAL_COMMUNITY)
Admission: RE | Admit: 2015-01-16 | Discharge: 2015-01-16 | Disposition: A | Discharge status: Home / Self Care | Payer: Managed Care, Other (non HMO) | Source: Ambulatory Visit | Attending: Cardiology | Admitting: Cardiology

## 2015-01-16 ENCOUNTER — Encounter (HOSPITAL_COMMUNITY): Payer: Managed Care, Other (non HMO)

## 2015-01-16 DIAGNOSIS — I4891 Unspecified atrial fibrillation: Secondary | ICD-10-CM | POA: Insufficient documentation

## 2015-01-16 DIAGNOSIS — I252 Old myocardial infarction: Secondary | ICD-10-CM | POA: Diagnosis not present

## 2015-01-16 DIAGNOSIS — Z5189 Encounter for other specified aftercare: Secondary | ICD-10-CM | POA: Insufficient documentation

## 2015-01-16 DIAGNOSIS — I429 Cardiomyopathy, unspecified: Secondary | ICD-10-CM | POA: Insufficient documentation

## 2015-01-16 DIAGNOSIS — I2510 Atherosclerotic heart disease of native coronary artery without angina pectoris: Secondary | ICD-10-CM | POA: Diagnosis not present

## 2015-01-16 DIAGNOSIS — I714 Abdominal aortic aneurysm, without rupture: Secondary | ICD-10-CM | POA: Diagnosis not present

## 2015-01-17 ENCOUNTER — Encounter (HOSPITAL_COMMUNITY): Payer: Self-pay

## 2015-01-17 ENCOUNTER — Encounter (HOSPITAL_COMMUNITY): Payer: Managed Care, Other (non HMO)

## 2015-01-17 ENCOUNTER — Encounter (HOSPITAL_COMMUNITY)
Admission: RE | Admit: 2015-01-17 | Discharge: 2015-01-17 | Disposition: A | Discharge status: Home / Self Care | Payer: Managed Care, Other (non HMO) | Source: Ambulatory Visit | Attending: Cardiology | Admitting: Cardiology

## 2015-01-19 ENCOUNTER — Encounter (HOSPITAL_COMMUNITY): Payer: Managed Care, Other (non HMO)

## 2015-01-19 ENCOUNTER — Encounter (HOSPITAL_COMMUNITY): Payer: Self-pay

## 2015-01-20 ENCOUNTER — Ambulatory Visit (HOSPITAL_COMMUNITY)
Admission: RE | Admit: 2015-01-20 | Discharge: 2015-01-20 | Disposition: A | Discharge status: Home / Self Care | Payer: Managed Care, Other (non HMO) | Source: Ambulatory Visit | Attending: Cardiology | Admitting: Cardiology

## 2015-01-20 DIAGNOSIS — I714 Abdominal aortic aneurysm, without rupture, unspecified: Secondary | ICD-10-CM

## 2015-01-20 NOTE — Progress Notes (Signed)
Abdominal aortic duplex completed. °Brianna L Mazza,RVT °

## 2015-01-23 ENCOUNTER — Encounter (HOSPITAL_COMMUNITY): Payer: Managed Care, Other (non HMO)

## 2015-01-23 ENCOUNTER — Encounter (HOSPITAL_COMMUNITY): Payer: Self-pay

## 2015-01-24 ENCOUNTER — Encounter (HOSPITAL_COMMUNITY)
Admission: RE | Admit: 2015-01-24 | Discharge: 2015-01-24 | Disposition: A | Discharge status: Home / Self Care | Payer: Managed Care, Other (non HMO) | Source: Ambulatory Visit | Attending: Cardiology | Admitting: Cardiology

## 2015-01-24 ENCOUNTER — Encounter (HOSPITAL_COMMUNITY): Payer: Managed Care, Other (non HMO)

## 2015-01-24 ENCOUNTER — Encounter (HOSPITAL_COMMUNITY): Payer: Self-pay

## 2015-01-26 ENCOUNTER — Encounter (HOSPITAL_COMMUNITY): Payer: Self-pay

## 2015-01-26 ENCOUNTER — Encounter (HOSPITAL_COMMUNITY): Payer: Managed Care, Other (non HMO)

## 2015-01-26 ENCOUNTER — Encounter (HOSPITAL_COMMUNITY)
Admission: RE | Admit: 2015-01-26 | Discharge: 2015-01-26 | Disposition: A | Discharge status: Home / Self Care | Payer: Managed Care, Other (non HMO) | Source: Ambulatory Visit | Attending: Cardiology | Admitting: Cardiology

## 2015-01-30 ENCOUNTER — Encounter (HOSPITAL_COMMUNITY): Payer: Managed Care, Other (non HMO)

## 2015-01-30 ENCOUNTER — Other Ambulatory Visit: Payer: Self-pay | Admitting: Cardiology

## 2015-01-30 ENCOUNTER — Encounter (HOSPITAL_COMMUNITY): Payer: Self-pay

## 2015-01-30 ENCOUNTER — Ambulatory Visit: Payer: Managed Care, Other (non HMO) | Admitting: Nurse Practitioner

## 2015-01-31 ENCOUNTER — Encounter (HOSPITAL_COMMUNITY): Payer: Managed Care, Other (non HMO)

## 2015-01-31 ENCOUNTER — Encounter (HOSPITAL_COMMUNITY)
Admission: RE | Admit: 2015-01-31 | Discharge: 2015-01-31 | Disposition: A | Discharge status: Home / Self Care | Payer: Managed Care, Other (non HMO) | Source: Ambulatory Visit | Attending: Cardiology | Admitting: Cardiology

## 2015-01-31 ENCOUNTER — Encounter (HOSPITAL_COMMUNITY): Payer: Self-pay

## 2015-02-02 ENCOUNTER — Encounter (HOSPITAL_COMMUNITY): Payer: Managed Care, Other (non HMO)

## 2015-02-02 ENCOUNTER — Encounter (HOSPITAL_COMMUNITY): Payer: Self-pay

## 2015-02-02 ENCOUNTER — Encounter (HOSPITAL_COMMUNITY)
Admission: RE | Admit: 2015-02-02 | Discharge: 2015-02-02 | Disposition: A | Discharge status: Home / Self Care | Payer: Managed Care, Other (non HMO) | Source: Ambulatory Visit | Attending: Cardiology | Admitting: Cardiology

## 2015-02-03 ENCOUNTER — Encounter (HOSPITAL_COMMUNITY)
Admission: RE | Admit: 2015-02-03 | Discharge: 2015-02-03 | Disposition: A | Discharge status: Home / Self Care | Payer: Managed Care, Other (non HMO) | Source: Ambulatory Visit | Attending: Cardiology | Admitting: Cardiology

## 2015-02-06 ENCOUNTER — Encounter (HOSPITAL_COMMUNITY): Payer: Managed Care, Other (non HMO)

## 2015-02-06 ENCOUNTER — Encounter (HOSPITAL_COMMUNITY): Payer: Self-pay

## 2015-02-06 ENCOUNTER — Encounter (HOSPITAL_COMMUNITY): Admission: RE | Admit: 2015-02-06 | Payer: Managed Care, Other (non HMO) | Source: Ambulatory Visit

## 2015-02-06 ENCOUNTER — Ambulatory Visit: Payer: Managed Care, Other (non HMO) | Admitting: Nurse Practitioner

## 2015-02-07 ENCOUNTER — Encounter (HOSPITAL_COMMUNITY)
Admission: RE | Admit: 2015-02-07 | Discharge: 2015-02-07 | Disposition: A | Discharge status: Home / Self Care | Payer: Managed Care, Other (non HMO) | Source: Ambulatory Visit | Attending: Cardiology | Admitting: Cardiology

## 2015-02-07 ENCOUNTER — Encounter (HOSPITAL_COMMUNITY): Payer: Managed Care, Other (non HMO)

## 2015-02-07 ENCOUNTER — Encounter (HOSPITAL_COMMUNITY): Payer: Self-pay

## 2015-02-09 ENCOUNTER — Encounter (HOSPITAL_COMMUNITY): Payer: Self-pay

## 2015-02-09 ENCOUNTER — Encounter (HOSPITAL_COMMUNITY): Payer: Managed Care, Other (non HMO)

## 2015-02-10 ENCOUNTER — Encounter (HOSPITAL_COMMUNITY)
Admission: RE | Admit: 2015-02-10 | Discharge: 2015-02-10 | Disposition: A | Discharge status: Home / Self Care | Payer: Managed Care, Other (non HMO) | Source: Ambulatory Visit | Attending: Cardiology | Admitting: Cardiology

## 2015-02-13 ENCOUNTER — Encounter (HOSPITAL_COMMUNITY): Payer: Self-pay

## 2015-02-13 ENCOUNTER — Encounter (HOSPITAL_COMMUNITY): Payer: Managed Care, Other (non HMO)

## 2015-02-13 DIAGNOSIS — Z5189 Encounter for other specified aftercare: Secondary | ICD-10-CM | POA: Insufficient documentation

## 2015-02-13 DIAGNOSIS — I252 Old myocardial infarction: Secondary | ICD-10-CM | POA: Insufficient documentation

## 2015-02-13 DIAGNOSIS — I2510 Atherosclerotic heart disease of native coronary artery without angina pectoris: Secondary | ICD-10-CM | POA: Insufficient documentation

## 2015-02-13 DIAGNOSIS — I714 Abdominal aortic aneurysm, without rupture: Secondary | ICD-10-CM | POA: Insufficient documentation

## 2015-02-13 DIAGNOSIS — I429 Cardiomyopathy, unspecified: Secondary | ICD-10-CM | POA: Insufficient documentation

## 2015-02-13 DIAGNOSIS — I4891 Unspecified atrial fibrillation: Secondary | ICD-10-CM | POA: Insufficient documentation

## 2015-02-14 ENCOUNTER — Encounter (HOSPITAL_COMMUNITY): Payer: Self-pay

## 2015-02-14 ENCOUNTER — Encounter (HOSPITAL_COMMUNITY): Payer: Managed Care, Other (non HMO)

## 2015-02-16 ENCOUNTER — Encounter (HOSPITAL_COMMUNITY)
Admission: RE | Admit: 2015-02-16 | Discharge: 2015-02-16 | Disposition: A | Discharge status: Home / Self Care | Payer: Self-pay | Source: Ambulatory Visit | Attending: Cardiology | Admitting: Cardiology

## 2015-02-16 ENCOUNTER — Encounter (HOSPITAL_COMMUNITY): Payer: Self-pay

## 2015-02-16 ENCOUNTER — Encounter (HOSPITAL_COMMUNITY): Payer: Managed Care, Other (non HMO)

## 2015-02-17 ENCOUNTER — Encounter (HOSPITAL_COMMUNITY)
Admission: RE | Admit: 2015-02-17 | Discharge: 2015-02-17 | Disposition: A | Discharge status: Home / Self Care | Payer: Self-pay | Source: Ambulatory Visit | Attending: Cardiology | Admitting: Cardiology

## 2015-02-20 ENCOUNTER — Encounter (HOSPITAL_COMMUNITY): Payer: Managed Care, Other (non HMO)

## 2015-02-20 ENCOUNTER — Encounter (HOSPITAL_COMMUNITY): Payer: Self-pay

## 2015-02-21 ENCOUNTER — Encounter (HOSPITAL_COMMUNITY)
Admission: RE | Admit: 2015-02-21 | Discharge: 2015-02-21 | Disposition: A | Discharge status: Home / Self Care | Payer: Self-pay | Source: Ambulatory Visit | Attending: Cardiology | Admitting: Cardiology

## 2015-02-21 ENCOUNTER — Encounter (HOSPITAL_COMMUNITY): Payer: Managed Care, Other (non HMO)

## 2015-02-21 ENCOUNTER — Encounter (HOSPITAL_COMMUNITY): Payer: Self-pay

## 2015-02-23 ENCOUNTER — Encounter (HOSPITAL_COMMUNITY): Payer: Self-pay

## 2015-02-23 ENCOUNTER — Encounter (HOSPITAL_COMMUNITY): Payer: Managed Care, Other (non HMO)

## 2015-02-27 ENCOUNTER — Encounter (HOSPITAL_COMMUNITY): Payer: Self-pay

## 2015-02-27 ENCOUNTER — Encounter (HOSPITAL_COMMUNITY): Payer: Managed Care, Other (non HMO)

## 2015-02-28 ENCOUNTER — Encounter (HOSPITAL_COMMUNITY): Payer: Self-pay

## 2015-02-28 ENCOUNTER — Encounter (HOSPITAL_COMMUNITY): Payer: Managed Care, Other (non HMO)

## 2015-03-01 ENCOUNTER — Encounter (HOSPITAL_COMMUNITY)
Admission: RE | Admit: 2015-03-01 | Discharge: 2015-03-01 | Disposition: A | Discharge status: Home / Self Care | Payer: Self-pay | Source: Ambulatory Visit | Attending: Cardiology | Admitting: Cardiology

## 2015-03-02 ENCOUNTER — Encounter (HOSPITAL_COMMUNITY): Payer: Managed Care, Other (non HMO)

## 2015-03-02 ENCOUNTER — Encounter (HOSPITAL_COMMUNITY): Payer: Self-pay

## 2015-03-02 ENCOUNTER — Encounter (HOSPITAL_COMMUNITY)
Admission: RE | Admit: 2015-03-02 | Discharge: 2015-03-02 | Disposition: A | Discharge status: Home / Self Care | Payer: Self-pay | Source: Ambulatory Visit | Attending: Cardiology | Admitting: Cardiology

## 2015-03-03 ENCOUNTER — Encounter (HOSPITAL_COMMUNITY)
Admission: RE | Admit: 2015-03-03 | Discharge: 2015-03-03 | Disposition: A | Discharge status: Home / Self Care | Payer: Self-pay | Source: Ambulatory Visit | Attending: Cardiology | Admitting: Cardiology

## 2015-03-06 ENCOUNTER — Encounter (HOSPITAL_COMMUNITY): Payer: Managed Care, Other (non HMO)

## 2015-03-06 ENCOUNTER — Encounter (HOSPITAL_COMMUNITY): Payer: Self-pay

## 2015-03-07 ENCOUNTER — Encounter (HOSPITAL_COMMUNITY): Payer: Self-pay

## 2015-03-07 ENCOUNTER — Encounter (HOSPITAL_COMMUNITY): Payer: Managed Care, Other (non HMO)

## 2015-03-07 ENCOUNTER — Encounter (HOSPITAL_COMMUNITY)
Admission: RE | Admit: 2015-03-07 | Discharge: 2015-03-07 | Disposition: A | Discharge status: Home / Self Care | Payer: Self-pay | Source: Ambulatory Visit | Attending: Cardiology | Admitting: Cardiology

## 2015-03-09 ENCOUNTER — Encounter (HOSPITAL_COMMUNITY): Payer: Self-pay

## 2015-03-09 ENCOUNTER — Encounter (HOSPITAL_COMMUNITY)
Admission: RE | Admit: 2015-03-09 | Discharge: 2015-03-09 | Disposition: A | Discharge status: Home / Self Care | Payer: Self-pay | Source: Ambulatory Visit | Attending: Cardiology | Admitting: Cardiology

## 2015-03-09 ENCOUNTER — Encounter (HOSPITAL_COMMUNITY): Payer: Managed Care, Other (non HMO)

## 2015-03-10 ENCOUNTER — Encounter (HOSPITAL_COMMUNITY)
Admission: RE | Admit: 2015-03-10 | Discharge: 2015-03-10 | Disposition: A | Discharge status: Home / Self Care | Payer: Self-pay | Source: Ambulatory Visit | Attending: Cardiology | Admitting: Cardiology

## 2015-03-14 ENCOUNTER — Encounter (HOSPITAL_COMMUNITY): Payer: Managed Care, Other (non HMO)

## 2015-03-14 ENCOUNTER — Encounter (HOSPITAL_COMMUNITY): Payer: Self-pay

## 2015-03-14 ENCOUNTER — Encounter (HOSPITAL_COMMUNITY)
Admission: RE | Admit: 2015-03-14 | Discharge: 2015-03-14 | Disposition: A | Discharge status: Home / Self Care | Payer: Self-pay | Source: Ambulatory Visit | Attending: Cardiology | Admitting: Cardiology

## 2015-03-16 ENCOUNTER — Telehealth: Payer: Self-pay | Admitting: Internal Medicine

## 2015-03-16 ENCOUNTER — Encounter (HOSPITAL_COMMUNITY)
Admission: RE | Admit: 2015-03-16 | Discharge: 2015-03-16 | Disposition: A | Discharge status: Home / Self Care | Payer: Self-pay | Source: Ambulatory Visit | Attending: Cardiology | Admitting: Cardiology

## 2015-03-16 DIAGNOSIS — I714 Abdominal aortic aneurysm, without rupture: Secondary | ICD-10-CM | POA: Insufficient documentation

## 2015-03-16 DIAGNOSIS — I4891 Unspecified atrial fibrillation: Secondary | ICD-10-CM | POA: Insufficient documentation

## 2015-03-16 DIAGNOSIS — Z5189 Encounter for other specified aftercare: Secondary | ICD-10-CM | POA: Insufficient documentation

## 2015-03-16 DIAGNOSIS — I429 Cardiomyopathy, unspecified: Secondary | ICD-10-CM | POA: Insufficient documentation

## 2015-03-16 DIAGNOSIS — I252 Old myocardial infarction: Secondary | ICD-10-CM | POA: Insufficient documentation

## 2015-03-16 DIAGNOSIS — I2510 Atherosclerotic heart disease of native coronary artery without angina pectoris: Secondary | ICD-10-CM | POA: Insufficient documentation

## 2015-03-16 NOTE — Telephone Encounter (Signed)
unfortunately pt will need an appointment as he has not been seen for this by Dr. Jenny Reichmann. Please advise pt of same, thanks!
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
## 2015-03-16 NOTE — Telephone Encounter (Signed)
Left message for patient with response.

## 2015-03-17 ENCOUNTER — Encounter (HOSPITAL_COMMUNITY)
Admission: RE | Admit: 2015-03-17 | Discharge: 2015-03-17 | Disposition: A | Discharge status: Home / Self Care | Payer: Self-pay | Source: Ambulatory Visit | Attending: Cardiology | Admitting: Cardiology

## 2015-03-20 ENCOUNTER — Encounter (HOSPITAL_COMMUNITY): Payer: Self-pay

## 2015-03-21 ENCOUNTER — Encounter (HOSPITAL_COMMUNITY)
Admission: RE | Admit: 2015-03-21 | Discharge: 2015-03-21 | Disposition: A | Discharge status: Home / Self Care | Payer: Self-pay | Source: Ambulatory Visit | Attending: Cardiology | Admitting: Cardiology

## 2015-03-21 ENCOUNTER — Other Ambulatory Visit (INDEPENDENT_AMBULATORY_CARE_PROVIDER_SITE_OTHER): Payer: Managed Care, Other (non HMO)

## 2015-03-21 DIAGNOSIS — E039 Hypothyroidism, unspecified: Secondary | ICD-10-CM

## 2015-03-21 DIAGNOSIS — Z0000 Encounter for general adult medical examination without abnormal findings: Secondary | ICD-10-CM

## 2015-03-21 DIAGNOSIS — E785 Hyperlipidemia, unspecified: Secondary | ICD-10-CM | POA: Diagnosis not present

## 2015-03-21 DIAGNOSIS — E119 Type 2 diabetes mellitus without complications: Secondary | ICD-10-CM | POA: Diagnosis not present

## 2015-03-21 DIAGNOSIS — Z0189 Encounter for other specified special examinations: Secondary | ICD-10-CM | POA: Diagnosis not present

## 2015-03-21 LAB — URINALYSIS, ROUTINE W REFLEX MICROSCOPIC
BILIRUBIN URINE: NEGATIVE
HGB URINE DIPSTICK: NEGATIVE
Ketones, ur: NEGATIVE
LEUKOCYTES UA: NEGATIVE
Nitrite: NEGATIVE
Specific Gravity, Urine: 1.025 (ref 1.000–1.030)
Total Protein, Urine: NEGATIVE
URINE GLUCOSE: NEGATIVE
Urobilinogen, UA: 0.2 (ref 0.0–1.0)
pH: 6 (ref 5.0–8.0)

## 2015-03-21 LAB — CBC WITH DIFFERENTIAL/PLATELET
BASOS ABS: 0 10*3/uL (ref 0.0–0.1)
BASOS PCT: 0.6 % (ref 0.0–3.0)
EOS PCT: 3.4 % (ref 0.0–5.0)
Eosinophils Absolute: 0.3 10*3/uL (ref 0.0–0.7)
HEMATOCRIT: 43.8 % (ref 39.0–52.0)
HEMOGLOBIN: 14.5 g/dL (ref 13.0–17.0)
LYMPHS ABS: 1.9 10*3/uL (ref 0.7–4.0)
LYMPHS PCT: 25.8 % (ref 12.0–46.0)
MCHC: 33.1 g/dL (ref 30.0–36.0)
MCV: 89.6 fl (ref 78.0–100.0)
MONOS PCT: 8.5 % (ref 3.0–12.0)
Monocytes Absolute: 0.6 10*3/uL (ref 0.1–1.0)
NEUTROS ABS: 4.6 10*3/uL (ref 1.4–7.7)
NEUTROS PCT: 61.7 % (ref 43.0–77.0)
Platelets: 139 10*3/uL — ABNORMAL LOW (ref 150.0–400.0)
RBC: 4.88 Mil/uL (ref 4.22–5.81)
RDW: 15.8 % — ABNORMAL HIGH (ref 11.5–15.5)
WBC: 7.5 10*3/uL (ref 4.0–10.5)

## 2015-03-21 LAB — LIPID PANEL
CHOL/HDL RATIO: 2
Cholesterol: 91 mg/dL (ref 0–200)
HDL: 41.3 mg/dL (ref >39.00)
LDL Cholesterol: 40 mg/dL (ref 0–99)
NonHDL: 49.7
TRIGLYCERIDES: 51 mg/dL (ref 0.0–149.0)
VLDL: 10.2 mg/dL (ref 0.0–40.0)

## 2015-03-21 LAB — PSA: PSA: 1.84 ng/mL (ref 0.10–4.00)

## 2015-03-21 LAB — HEPATIC FUNCTION PANEL
ALT: 19 U/L (ref 0–53)
AST: 19 U/L (ref 0–37)
Albumin: 4.2 g/dL (ref 3.5–5.2)
Alkaline Phosphatase: 82 U/L (ref 39–117)
Bilirubin, Direct: 0.2 mg/dL (ref 0.0–0.3)
Total Bilirubin: 0.6 mg/dL (ref 0.2–1.2)
Total Protein: 6.8 g/dL (ref 6.0–8.3)

## 2015-03-21 LAB — TSH: TSH: 5.61 u[IU]/mL — AB (ref 0.35–4.50)

## 2015-03-21 LAB — BASIC METABOLIC PANEL
BUN: 18 mg/dL (ref 6–23)
CHLORIDE: 104 meq/L (ref 96–112)
CO2: 29 meq/L (ref 19–32)
Calcium: 8.8 mg/dL (ref 8.4–10.5)
Creatinine, Ser: 0.95 mg/dL (ref 0.40–1.50)
GFR: 83.57 mL/min (ref >60.00)
Glucose, Bld: 129 mg/dL — ABNORMAL HIGH (ref 70–99)
Potassium: 4.4 mEq/L (ref 3.5–5.1)
SODIUM: 139 meq/L (ref 135–145)

## 2015-03-21 LAB — MICROALBUMIN / CREATININE URINE RATIO
Creatinine,U: 132.6 mg/dL
Microalb Creat Ratio: 0.6 mg/g (ref 0.0–30.0)
Microalb, Ur: 0.8 mg/dL (ref 0.0–1.9)

## 2015-03-21 LAB — HEMOGLOBIN A1C: HEMOGLOBIN A1C: 6.4 % (ref 4.6–6.5)

## 2015-03-23 ENCOUNTER — Encounter (HOSPITAL_COMMUNITY)
Admission: RE | Admit: 2015-03-23 | Discharge: 2015-03-23 | Disposition: A | Discharge status: Home / Self Care | Payer: Self-pay | Source: Ambulatory Visit | Attending: Cardiology | Admitting: Cardiology

## 2015-03-24 ENCOUNTER — Encounter (HOSPITAL_COMMUNITY)
Admission: RE | Admit: 2015-03-24 | Discharge: 2015-03-24 | Disposition: A | Discharge status: Home / Self Care | Payer: Self-pay | Source: Ambulatory Visit | Attending: Cardiology | Admitting: Cardiology

## 2015-03-27 ENCOUNTER — Encounter (HOSPITAL_COMMUNITY): Payer: Self-pay

## 2015-03-28 ENCOUNTER — Ambulatory Visit: Payer: Managed Care, Other (non HMO) | Admitting: Internal Medicine

## 2015-03-28 ENCOUNTER — Encounter (HOSPITAL_COMMUNITY): Payer: Self-pay

## 2015-03-29 ENCOUNTER — Encounter: Payer: Self-pay | Admitting: Internal Medicine

## 2015-03-29 ENCOUNTER — Ambulatory Visit (INDEPENDENT_AMBULATORY_CARE_PROVIDER_SITE_OTHER): Payer: Managed Care, Other (non HMO) | Admitting: Internal Medicine

## 2015-03-29 VITALS — BP 118/70 | HR 75 | Temp 97.5°F | Ht 73.0 in | Wt 242.0 lb

## 2015-03-29 DIAGNOSIS — M25511 Pain in right shoulder: Secondary | ICD-10-CM

## 2015-03-29 DIAGNOSIS — E119 Type 2 diabetes mellitus without complications: Secondary | ICD-10-CM

## 2015-03-29 DIAGNOSIS — E039 Hypothyroidism, unspecified: Secondary | ICD-10-CM | POA: Diagnosis not present

## 2015-03-29 DIAGNOSIS — M25512 Pain in left shoulder: Secondary | ICD-10-CM | POA: Diagnosis not present

## 2015-03-29 DIAGNOSIS — Z0000 Encounter for general adult medical examination without abnormal findings: Secondary | ICD-10-CM

## 2015-03-29 MED ORDER — LEVOTHYROXINE SODIUM 112 MCG PO TABS: 112.0000 ug | ORAL_TABLET | Freq: Every day | ORAL | Status: DC

## 2015-03-29 MED ORDER — LINAGLIPTIN 5 MG PO TABS: 5.0000 mg | ORAL_TABLET | Freq: Every day | ORAL | Status: DC

## 2015-03-29 NOTE — Progress Notes (Signed)
Subjective:    Patient ID: Allen Myers, male    DOB: May 23, 1946, 69 y.o.   MRN: [id number]  HPI  Here for wellness and f/u;  Overall doing ok;  Pt denies Chest pain, worsening SOB, DOE, wheezing, orthopnea, PND, worsening LE edema, palpitations, dizziness or syncope.  Pt denies neurological change such as new headache, facial or extremity weakness.  Pt denies polydipsia, polyuria, or low sugar symptoms. Pt states overall good compliance with treatment and medications, good tolerability, and has been trying to follow appropriate diet.  Pt denies worsening depressive symptoms, suicidal ideation or panic. No fever, night sweats, wt loss, loss of appetite, or other constitutional symptoms.  Pt states good ability with ADL's, has low fall risk, home safety reviewed and adequate, no other significant changes in hearing or vision, and daily active with exercise.  Hard to lose wt though Wt Readings from Last 3 Encounters:  03/29/15 242 lb (109.77 kg)  12/30/14 234 lb (106.142 kg)  11/23/14 236 lb 9.6 oz (107.321 kg)  mentions today he has been taking the tradjenta and actos 45 mg until the psat wk, then ran out of tradjenta (since we had discussed stopping last visit); has rx for actos 30 mg pending (down from the 45 mg).  CBG's has been 150's and higher off the tradjenta, wants to re-start.  Also Pt continues to have recurring LBP without change in severity, bowel or bladder change, fever, wt loss,  worsening LE pain/numbness/weakness, gait change or falls.  Saw [place] ortho yesterday, plain films done, plan is for [place] spine mRI per pt. Pain worse to sit, did have compression fx with fall involving the [facility].  Also is wondering about statin causing reduced ROM of bilat shoulders such that he could be able to put hands behind his back.  A TV doctor mentions to ask about muscle pain on statins.   Past Medical History  Diagnosis Date  . Hyperlipidemia   . History of colon polyps   . History of diverticulitis   .  Erectile dysfunction   . HYPERTENSION   . SDH (subdural hematoma)     Following syncopal episode  . SAH (subarachnoid hemorrhage)     Following syncopal episode  . Kidney stones     "passed it"  . Sleep apnea in adult   . Hypothyroidism   . Depression   . Type II diabetes mellitus   . Seizures     just once a few weeks ago" (05/11/2014)   Past Surgical History  Procedure Laterality Date  . Colonoscopy    . Polypectomy    . Coronary angioplasty with stent placement  05/11/2014    "1"  . Craniotomy Left 09/12/2014    Procedure: CRANIOTOMY HEMATOMA EVACUATION SUBDURAL;  Surgeon: Erline Levine, MD;  Location: [facility];  Service: Neurosurgery;  Laterality: Left;  . Left heart catheterization with coronary angiogram N/A 05/11/2014    Procedure: LEFT HEART CATHETERIZATION WITH CORONARY ANGIOGRAM;  Surgeon: [name], MD;  Location: [facility] CATH LAB;  Service: Cardiovascular;  Laterality: N/A;  . Fractional flow reserve wire  05/11/2014    Procedure: FRACTIONAL FLOW RESERVE WIRE;  Surgeon: [name], MD;  Location: [facility] CATH LAB;  Service: Cardiovascular;;    reports that he quit smoking about 31 years ago. His smoking use included Cigarettes. He has a 30 pack-year smoking history. He has never used smokeless tobacco. He reports that he does not drink alcohol or use illicit drugs.  family history includes Cancer in his brother and cousin; Heart disease in his father and mother; Lung cancer in his father. There is no history of Colon cancer, Rectal cancer, or Stomach cancer. No Known Allergies Current Outpatient Prescriptions on File Prior to Visit  Medication Sig Dispense Refill  . aspirin EC 81 MG tablet Take 81 mg by mouth daily.    Marland Kitchen atorvastatin (LIPITOR) 80 MG tablet TAKE 1 TABLET BY MOUTH DAILY 90 tablet 1  . carvedilol (COREG) 3.125 MG tablet Take 1 tablet (3.125 mg total) by mouth 2 (two) times daily with a meal. 180 tablet 3  . glucose blood (ONE TOUCH TEST STRIPS) test  strip Use as directed twice daily to check blood sugar.  Diagnosis code 250.00 100 each 11  . levETIRAcetam (KEPPRA) 1000 MG tablet Take 1 tablet (1,000 mg total) by mouth 2 (two) times daily. 60 tablet 1  . levothyroxine (SYNTHROID, LEVOTHROID) 100 MCG tablet Take 1 tablet (100 mcg total) by mouth daily before breakfast. 90 tablet 3  . linagliptin (TRADJENTA) 5 MG TABS tablet Take 5 mg by mouth daily.    Marland Kitchen lisinopril (PRINIVIL,ZESTRIL) 2.5 MG tablet Take 1 tablet (2.5 mg total) by mouth daily. 90 tablet 1  . metFORMIN (GLUCOPHAGE) 1000 MG tablet Take 1 tablet (1,000 mg total) by mouth 2 (two) times daily with a meal. 180 tablet 3  . nitroGLYCERIN (NITROSTAT) 0.4 MG SL tablet Place 1 tablet (0.4 mg total) under the tongue every 5 (five) minutes as needed for chest pain. 25 tablet 12  . pioglitazone (ACTOS) 30 MG tablet Take 1 tablet (30 mg total) by mouth daily. 90 tablet 3   No current facility-administered medications on file prior to visit.    Review of Systems Constitutional: Negative for increased diaphoresis, other activity, appetite or siginficant weight change other than noted HENT: Negative for worsening hearing loss, ear pain, facial swelling, mouth sores and neck stiffness.   Eyes: Negative for other worsening pain, redness or visual disturbance.  Respiratory: Negative for shortness of breath and wheezing  Cardiovascular: Negative for chest pain and palpitations.  Gastrointestinal: Negative for diarrhea, blood in stool, abdominal distention or other pain Genitourinary: Negative for hematuria, flank pain or change in urine volume.  Musculoskeletal: Negative for myalgias or other joint complaints.  Skin: Negative for color change and wound or drainage.  Neurological: Negative for syncope and numbness. other than noted Hematological: Negative for adenopathy. or other swelling Psychiatric/Behavioral: Negative for hallucinations, SI, self-injury, decreased concentration or other  worsening agitation.      Objective:   Physical Exam BP 118/70 mmHg  Pulse 75  Temp(Src) 97.5 F (36.4 C) (Oral)  Ht 6\' 1"  (1.854 m)  Wt 242 lb (109.77 kg)  BMI 31.93 kg/m2  SpO2 94% VS noted,  Constitutional: Pt is oriented to person, place, and time. Appears well-developed and well-nourished, in no significant distress Head: Normocephalic and atraumatic.  Right Ear: External ear normal.  Left Ear: External ear normal.  Nose: Nose normal.  Mouth/Throat: Oropharynx is clear and moist.  Eyes: Conjunctivae and EOM are normal. Pupils are equal, round, and reactive to light.  Neck: Normal range of motion. Neck supple. No JVD present. No tracheal deviation present or significant neck LA or mass Cardiovascular: Normal rate, regular rhythm, normal heart sounds and intact distal pulses.   Pulmonary/Chest: Effort normal and breath sounds without rales or wheezing  Abdominal: Soft. Bowel sounds are normal. NT. No HSM  Musculoskeletal: Normal range of motion. Exhibits no  edema.  Lymphadenopathy:  Has no cervical adenopathy.  Neurological: Pt is alert and oriented to person, place, and time. Pt has normal reflexes. No cranial nerve deficit. Motor grossly intact Skin: Skin is warm and dry. No rash noted.  Psychiatric:  Has normal mood and affect. Behavior is normal.  Spine nontender excpet for low mid coccyx Bilat shoulders with reducedROM posterirly    Assessment & Plan:

## 2015-03-29 NOTE — Assessment & Plan Note (Signed)
Mild uncontrolled o/w stable overall by history and exam, recent data reviewed with pt, and pt to continue medical treatment as before except incr levothyr to 112 mcg qd,,  to f/u any worsening symptoms or concerns Lab Results  Component Value Date   TSH 5.61* 03/21/2015   For f/u lab next visit

## 2015-03-29 NOTE — Assessment & Plan Note (Signed)

## 2015-03-29 NOTE — Assessment & Plan Note (Signed)
D/w pt, very unlikely to be statin related, ok to cont statin, for referral Dr Smith/sport med

## 2015-03-29 NOTE — Patient Instructions (Signed)
OK to increase the levothyroxine to 112 mcg per day  OK to re-start the tradjenta at 5 mg per day  Please continue all other medications as before, and refills have been done if requested, includiing the actos at 30 mg per day  Please have the pharmacy call with any other refills you may need.  Please continue your efforts at being more active, low cholesterol diet, and weight control.  You are otherwise up to date with prevention measures today.  Please keep your appointments with your specialists as you may have planned - [facility] for the lower back  Please ask at Scheduling desk as you leave about an appt with Dr Smith/Sport Medicine regarding the shoulder pain  Please return in 6 months, or sooner if needed, with Lab testing done 3-5 days before

## 2015-03-29 NOTE — Assessment & Plan Note (Signed)
Very well controlled except for the past wk when pt made changes to his regimen, ok to cont metformin 1000 bid, actos at 30 mg, and re-start the tradjenta at 5 mg per day, cont diet, monitor cbg's, wt loss efforts, f/u labs next visiit  Lab Results  Component Value Date   HGBA1C 6.4 03/21/2015

## 2015-03-29 NOTE — Progress Notes (Signed)
Pre visit review using our clinic review tool, if applicable. No additional management support is needed unless otherwise documented below in the visit note. 

## 2015-03-30 ENCOUNTER — Telehealth: Payer: Self-pay | Admitting: Internal Medicine

## 2015-03-30 ENCOUNTER — Encounter (HOSPITAL_COMMUNITY)
Admission: RE | Admit: 2015-03-30 | Discharge: 2015-03-30 | Disposition: A | Discharge status: Home / Self Care | Payer: Self-pay | Source: Ambulatory Visit | Attending: Cardiology | Admitting: Cardiology

## 2015-03-30 NOTE — Telephone Encounter (Signed)
Pt called in and said that [facility] is needs a PA for linagliptin (TRADJENTA) 5 MG TABS tablet [[id number]]  That was sent in yesterday    [phone] Option 4  [employer]   He would rather have this sent though the [place] home Delivery.  90 day supply

## 2015-04-03 ENCOUNTER — Ambulatory Visit: Payer: Managed Care, Other (non HMO) | Admitting: Family Medicine

## 2015-04-03 ENCOUNTER — Encounter (HOSPITAL_COMMUNITY): Payer: Self-pay

## 2015-04-04 ENCOUNTER — Encounter (HOSPITAL_COMMUNITY)
Admission: RE | Admit: 2015-04-04 | Discharge: 2015-04-04 | Disposition: A | Discharge status: Home / Self Care | Payer: Self-pay | Source: Ambulatory Visit | Attending: Cardiology | Admitting: Cardiology

## 2015-04-05 NOTE — Telephone Encounter (Signed)
Ok for PA, but no guarantees to pt regarding this; ultimately his drug coverage is between him and his insurance, and we may be forced to change

## 2015-04-05 NOTE — Telephone Encounter (Signed)
Patient would like to keep taking the Tradjenta, and nothing else because it works for him, it just need a Prior Auth, please advise

## 2015-04-05 NOTE — Telephone Encounter (Signed)
Per pt's insurance company Allen Myers is not covered, alternative medications are: Janumet, Janumet ER, and Januvia

## 2015-04-06 ENCOUNTER — Encounter (HOSPITAL_COMMUNITY): Payer: Self-pay

## 2015-04-07 ENCOUNTER — Encounter (HOSPITAL_COMMUNITY)
Admission: RE | Admit: 2015-04-07 | Discharge: 2015-04-07 | Disposition: A | Discharge status: Home / Self Care | Payer: Self-pay | Source: Ambulatory Visit | Attending: Cardiology | Admitting: Cardiology

## 2015-04-10 ENCOUNTER — Encounter (HOSPITAL_COMMUNITY): Payer: Self-pay

## 2015-04-11 ENCOUNTER — Encounter (HOSPITAL_COMMUNITY): Payer: Self-pay

## 2015-04-11 ENCOUNTER — Ambulatory Visit: Payer: Managed Care, Other (non HMO) | Admitting: Family Medicine

## 2015-04-11 MED ORDER — SITAGLIPTIN PHOSPHATE 100 MG PO TABS: 100.0000 mg | ORAL_TABLET | Freq: Every day | ORAL | Status: DC

## 2015-04-11 NOTE — Telephone Encounter (Signed)
Janumet, Janumet ER, Januvia

## 2015-04-11 NOTE — Telephone Encounter (Signed)
Per [employer], because pt has not tried and failed on formulary drugs, Arty Baumgartner will not be covered. Pt options are to pay out of pocket or switch to one of the formulary drugs

## 2015-04-11 NOTE — Telephone Encounter (Signed)
Ok for change to [place] - done erx to [place] home delivery

## 2015-04-11 NOTE — Telephone Encounter (Signed)
Sounds good, but I would need to know what the formulary drugs are, thanks

## 2015-04-12 NOTE — Telephone Encounter (Signed)
Pt advised and agreed to alternative medication

## 2015-04-13 ENCOUNTER — Ambulatory Visit (INDEPENDENT_AMBULATORY_CARE_PROVIDER_SITE_OTHER): Payer: Managed Care, Other (non HMO) | Admitting: Family Medicine

## 2015-04-13 ENCOUNTER — Encounter (HOSPITAL_COMMUNITY): Payer: Self-pay

## 2015-04-13 ENCOUNTER — Encounter: Payer: Self-pay | Admitting: Family Medicine

## 2015-04-13 VITALS — BP 104/60 | HR 60 | Ht 73.0 in | Wt 246.0 lb

## 2015-04-13 DIAGNOSIS — M25511 Pain in right shoulder: Secondary | ICD-10-CM

## 2015-04-13 DIAGNOSIS — I2510 Atherosclerotic heart disease of native coronary artery without angina pectoris: Secondary | ICD-10-CM

## 2015-04-13 DIAGNOSIS — M25512 Pain in left shoulder: Secondary | ICD-10-CM

## 2015-04-13 NOTE — Progress Notes (Signed)
Pre visit review using our clinic review tool, if applicable. No additional management support is needed unless otherwise documented below in the visit note. 

## 2015-04-13 NOTE — Assessment & Plan Note (Signed)
Patient does have signs and symptoms that are consistent with more of a bursitis. Patient has elected try oral anti-inflammatory's and we'll do this for short course of the next 6 days. We also discussed topical anti-inflammatory's and patient was given a trial size. We discussed home exercises and patient work with Product/process development scientist today. Differential also includes polymyalgia rheumatica and if patient continues to have pain I would like to get labs. Patient also could have some cervical radiculopathy with patient's history of having osteophytic changes of the cervical spine. Patient does exam does not seem to be consistent with this. We discussed over-the-counter vitamin D supplementation to help as well. Patient has already made changes to his thyroid but this could also be contributing to some of the discomfort. Patient come back again in 3 weeks. At that time if continuing have pain I would consider injections in the shoulder.

## 2015-04-13 NOTE — Patient Instructions (Signed)
Good to meet you Ice your shoulders at least 2x/day, after activity and before bedtime Complete the exercises 3x/week Try the Pennsaid (topical anti-inflammatory) 2x/day Take the Duexis for 6days (1pill 3x/day) Vitamin D 2000 IU daily See me again in 3 weeks and if not better we will do injections.

## 2015-04-13 NOTE — Progress Notes (Signed)
[facility] [place], [place] [address] Phone: [phone] Subjective:    I'm seeing this patient by the request  of:  Cathlean Cower, MD   CC: bilateral shoulder pain .   Allen Myers is a 69 y.o. male coming in with complaint of bilateral shoulder pain. Patient says that the shoulder pain seems to be getting worse over time. Patient states it is more the arms bilaterally. Describes it worse with certain movements such as reaching behind his back or trying to get something out of the back seat. Patient denies any weakness but states that even trying to do a throwing motion can be difficult. Patient has noticed some mild worsening since he's been doing the statin medication but does not know if this was causing a. Patient recently saw primary care provider and also had to change his thyroid medication. Patient denies any numbness in the fingertips. States that he always has neck pain is but does not think it is associated. Patient denies any nighttime awakening.     Past Medical History  Diagnosis Date  . Hyperlipidemia   . History of colon polyps   . History of diverticulitis   . Erectile dysfunction   . HYPERTENSION   . SDH (subdural hematoma)     Following syncopal episode  . SAH (subarachnoid hemorrhage)     Following syncopal episode  . Kidney stones     "passed it"  . Sleep apnea in adult   . Hypothyroidism   . Depression   . Type II diabetes mellitus   . Seizures     just once a few weeks ago" (05/11/2014)   Past Surgical History  Procedure Laterality Date  . Colonoscopy    . Polypectomy    . Coronary angioplasty with stent placement  05/11/2014    "1"  . Craniotomy Left 09/12/2014    Procedure: CRANIOTOMY HEMATOMA EVACUATION SUBDURAL;  Surgeon: Erline Levine, MD;  Location: [facility];  Service: Neurosurgery;  Laterality: Left;  . Left heart catheterization with coronary angiogram N/A 05/11/2014    Procedure:  LEFT HEART CATHETERIZATION WITH CORONARY ANGIOGRAM;  Surgeon: [name], MD;  Location: [facility] CATH LAB;  Service: Cardiovascular;  Laterality: N/A;  . Fractional flow reserve wire  05/11/2014    Procedure: FRACTIONAL FLOW RESERVE WIRE;  Surgeon: [name], MD;  Location: [facility] CATH LAB;  Service: Cardiovascular;;   History  Substance Use Topics  . Smoking status: Former Smoker -- 1.00 packs/day for 30 years    Types: Cigarettes    Quit date: 10/15/1983  . Smokeless tobacco: Never Used  . Alcohol Use: No   No Known Allergies  Family History  Problem Relation Age of Onset  . Cancer Brother     prostate cancer  . Cancer Cousin     lung cancer  . Colon cancer Neg Hx   . Rectal cancer Neg Hx   . Stomach cancer Neg Hx   . Lung cancer Father     dad was a smoker  . Heart disease Father   . Heart disease Mother      Past medical history, social, surgical and family history all reviewed in electronic medical record.   Review of Systems: No headache, visual changes, nausea, vomiting, diarrhea, constipation, dizziness, abdominal pain, skin rash, fevers, chills, night sweats, weight loss, swollen lymph nodes, body aches, joint swelling, muscle aches, chest pain, shortness of breath, mood changes.  Objective Blood pressure 104/60, pulse 60, height 6\' 1"  (1.854 m), weight 246 lb (111.585 kg), SpO2 96 %.  General: No apparent distress alert and oriented x3 mood and affect normal, dressed appropriately.  HEENT: Pupils equal, extraocular movements intact  Respiratory: Patient's speak in full sentences and does not appear short of breath  Cardiovascular: No lower extremity edema, non tender, no erythema  Skin: Warm dry intact with no signs of infection or rash on extremities or on axial skeleton.  Abdomen: Soft nontender  Neuro: Cranial nerves II through XII are intact, neurovascularly intact in all extremities with 2+ DTRs and 2+ pulses.  Lymph: No lymphadenopathy of posterior or  anterior cervical chain or axillae bilaterally.  Gait normal with good balance and coordination.  MSK:  Non tender with full range of motion and good stability and symmetric strength and tone of  elbows, wrist, hip, knee and ankles bilaterally.  Neck: Inspection unremarkable. No palpable stepoffs. Negative Spurling's maneuver. Mild decrease in range of motion especially lacking the last 10 of extension as well as 10 side bending bilaterally Grip strength and sensation normal in bilateral hands Strength good C4 to T1 distribution No sensory change to C4 to T1 Negative Hoffman sign bilaterally Reflexes normal  Shoulder: Bilateral Inspection reveals no abnormalities, atrophy or asymmetry. Palpation is normal with no tenderness over AC joint or bicipital groove. ROM is full in all planes. Rotator cuff strength normal throughout. Mild impingement bilaterally with positive Neer's and Hawkins Speeds and Yergason's tests normal. No labral pathology noted with negative Obrien's, negative clunk and good stability. Normal scapular function observed. No painful arc and no drop arm sign. No apprehension sign  Procedure note 97110; 15 minutes spent for Therapeutic exercises as stated in above notes.  This included exercises focusing on stretching, strengthening, with significant focus on eccentric aspects. Shoulder Exercises that included:  Basic scapular stabilization to include adduction and depression of scapula Scaption, focusing on proper movement and good control Internal and External rotation utilizing a theraband, with elbow tucked at side entire time Rows with theraband  Proper technique shown and discussed handout in great detail with ATC.  All questions were discussed and answered.      Impression and Recommendations:     This case required medical decision making of moderate complexity.

## 2015-04-14 ENCOUNTER — Encounter (HOSPITAL_COMMUNITY)
Admission: RE | Admit: 2015-04-14 | Discharge: 2015-04-14 | Disposition: A | Discharge status: Home / Self Care | Payer: Self-pay | Source: Ambulatory Visit | Attending: Cardiology | Admitting: Cardiology

## 2015-04-14 DIAGNOSIS — I252 Old myocardial infarction: Secondary | ICD-10-CM | POA: Insufficient documentation

## 2015-04-14 DIAGNOSIS — I714 Abdominal aortic aneurysm, without rupture: Secondary | ICD-10-CM | POA: Insufficient documentation

## 2015-04-14 DIAGNOSIS — I429 Cardiomyopathy, unspecified: Secondary | ICD-10-CM | POA: Insufficient documentation

## 2015-04-14 DIAGNOSIS — I2510 Atherosclerotic heart disease of native coronary artery without angina pectoris: Secondary | ICD-10-CM | POA: Insufficient documentation

## 2015-04-14 DIAGNOSIS — I4891 Unspecified atrial fibrillation: Secondary | ICD-10-CM | POA: Insufficient documentation

## 2015-04-14 DIAGNOSIS — Z5189 Encounter for other specified aftercare: Secondary | ICD-10-CM | POA: Insufficient documentation

## 2015-04-18 ENCOUNTER — Encounter (HOSPITAL_COMMUNITY)
Admission: RE | Admit: 2015-04-18 | Discharge: 2015-04-18 | Disposition: A | Discharge status: Home / Self Care | Payer: Self-pay | Source: Ambulatory Visit | Attending: Cardiology | Admitting: Cardiology

## 2015-04-18 ENCOUNTER — Encounter: Payer: Self-pay | Admitting: Family Medicine

## 2015-04-20 ENCOUNTER — Encounter (HOSPITAL_COMMUNITY): Payer: Self-pay

## 2015-04-21 ENCOUNTER — Encounter (HOSPITAL_COMMUNITY)
Admission: RE | Admit: 2015-04-21 | Discharge: 2015-04-21 | Disposition: A | Discharge status: Home / Self Care | Payer: Self-pay | Source: Ambulatory Visit | Attending: Cardiology | Admitting: Cardiology

## 2015-04-22 ENCOUNTER — Other Ambulatory Visit: Payer: Self-pay | Admitting: Internal Medicine

## 2015-04-24 ENCOUNTER — Encounter: Payer: Self-pay | Admitting: Neurology

## 2015-04-24 ENCOUNTER — Encounter (HOSPITAL_COMMUNITY): Payer: Self-pay

## 2015-04-24 ENCOUNTER — Ambulatory Visit: Payer: Managed Care, Other (non HMO) | Admitting: Neurology

## 2015-04-24 ENCOUNTER — Ambulatory Visit (INDEPENDENT_AMBULATORY_CARE_PROVIDER_SITE_OTHER): Payer: Managed Care, Other (non HMO) | Admitting: Neurology

## 2015-04-24 VITALS — BP 95/61 | HR 60 | Ht 73.0 in | Wt 245.6 lb

## 2015-04-24 DIAGNOSIS — I10 Essential (primary) hypertension: Secondary | ICD-10-CM

## 2015-04-24 DIAGNOSIS — E785 Hyperlipidemia, unspecified: Secondary | ICD-10-CM

## 2015-04-24 DIAGNOSIS — S065X9A Traumatic subdural hemorrhage with loss of consciousness of unspecified duration, initial encounter: Secondary | ICD-10-CM | POA: Insufficient documentation

## 2015-04-24 DIAGNOSIS — I6200 Nontraumatic subdural hemorrhage, unspecified: Secondary | ICD-10-CM

## 2015-04-24 DIAGNOSIS — R569 Unspecified convulsions: Secondary | ICD-10-CM | POA: Diagnosis not present

## 2015-04-24 DIAGNOSIS — S065XAA Traumatic subdural hemorrhage with loss of consciousness status unknown, initial encounter: Secondary | ICD-10-CM

## 2015-04-24 NOTE — Patient Instructions (Signed)
-   continue ASA 81mg  and lipitor for stroke and cardiac prevention. - continue keppra 1000mg  bid for seizure control. - you have seizure free for more than 6 months and you can resume driving provided you are on the seizure medication and under physician's care - Maintain seizure precautions - Do not participate in activities where a loss of awareness could hurt you or someone else.  No swimming alone, no tub bathing, no hot tubs.  No standing at heights, such as rooftops, ladders or stairs. No sliding boards, monkey bars or swings, or climbing trees.  Avoid hot objects such as stoves, heaters, open fires.  Wear a helmet when riding a bicycle, scooter, skateboard, etc. and avoid areas of traffic.  Set water heater to 120 degrees or less. - Follow up with your primary care physician for stroke risk factor modification. Recommend maintain blood pressure goal <130/80, diabetes with hemoglobin A1c goal below 6.5% and lipids with LDL cholesterol goal below 70 mg/dL.  - check BP and glucose at home - follow up in 6 months.

## 2015-04-24 NOTE — Progress Notes (Signed)
STROKE NEUROLOGY FOLLOW UP NOTE  NAME: ARIAS WEINERT DOB: 12-06-1945  REASON FOR VISIT: stroke follow up HISTORY FROM: chart  Today we had the pleasure of seeing AERON DONAGHEY in follow-up at our Neurology Clinic. Pt was accompanied by wife.   History Summary 69 year Caucasian male who had episode of brief loss of consciousness on 04/01/14. He was raking some leaves on the roof of his house when he states that he felt dizzy and lost balance and fell down and hit his head. He must have lost consciousness for a while. His wife called his neighbor who climbed on the roof and noticed that the patient was all pale and bluish and had some jerking. He was found to have multiple rib fractures and L1 transverse process fracture. CT scan of the head showed a small right convexity subdural hematoma and superficial left temporoparietal occipital subarachnoid hemorrhage. He was treated conservatively and remained stable. He subsequently had a witnessed generalized tonic-clonic seizure the next after admission and required temporary intubation for her for protection. He was started on Keppra and had no further seizures during hospitalization. Followup CT scan of the brain showed stable appearance. Followup CT scan done on 04/28/14 showed complete resolution of the tiny subdural and somewhat subarachnoid bleeds. He states is done well and has had no further seizures. His headaches have resolved and he has not taken any headache medications for more than 2-3 weeks. He has no focal neurological symptoms or deficits. He does however complain of orthostatic dizziness and mild orthostasis symptoms when he stands up. He denies gait imbalance, double vision or vertigo. He has not had any prior history of strokes or TIAs. He seems to be tolerating Keppra 500 twice daily well without major side effects. He had cardiac catheterization done by Dr. Daneen Schick and had a stent placed. He is currently on aspirin 81 mg.   Follow  up 07/28/14 (PS) - He has not had any recurrent seizures. He has been tolerating Keppra quite well without any side effects. He states his orthostatic dizziness symptoms are much improved and probably intermittently. He does do orthostatic problems exercises. He underwent followup MRI scan of the brain on 05/28/14 shows only a thin rim of resolving subdural hematoma over the right convexity. MRA of the brain shows no large vessel stenosis, occlusion or aneurysms . EEG done on 05/17/14 was normal without epileptiform activity noted. Keppra was continued.  Follow up 09/26/14 - the patient had a fall mid Nov. One week after the fall, he was admitted to [facility] 09/06/14 due to speech difficulties and right cheek numbness. It comes and goes and episodic. Pt was brought to ED and CT showed left hemispheric SDH. At the beginning it not very frequent and lasting 27min but as time goes by, the episodes more frequent and lasting longer up to 57min. His ASA and brilinta were discontinued and his keppar was increased to 1000mg  bid. Repeat CT showed slight enlargement of SDH, and pt continued with aphasia and right arm weakness episodes. Therefore, left SDH evacuation was done on 09/12/14 by NSG. Pt condition getting better and symptoms resolved. He was discharged on 09/16/14 with resumption of baby ASA.  He had cardiac stent in July and since then he was on ASA and brilinta. Due to the SDH, he is currently not on brilinta. A early referral was requested for management of antiplatelet. He is on ASA 81mg  now.  Follow up 11/23/14 - he was doing well. No  seizures and no neurological changes. He is on cardio rehab and he feels great. He had CT repeat showed evolving left SDH but not completely resolved. He followed with Dr. Stanford Breed in cardiology and OK with just on ASA 81mg  for CAD. He also saw NSG for follow up and was told in good shape. He continued on baby ASA and lipitor and keppra. He admit that he did drive a couple of time  short distance to grocery store. He was again instructed not to drive until seizure free 6 months. His BP 96/62 in clinic today but he stated that is his normal BP at home. He stated that his glucose controlled well at home, glucose 100-120s.  Interval History During the interval time, he has been doing well and no recurrent seizures. He has resumed driving and continued on keppra and ASA. No acute issues. He is wondering whether he is able to taper off keppra. Told him for taper off keppra, he also needs to refrain from driving for 6 months after taper to monitor seizure. He then prefer to be on the medication to allow him continued driving. BP 95/61 today.   REVIEW OF SYSTEMS: Full 14 system review of systems performed and notable only for those listed below and in HPI above, all others are negative:  Constitutional: N/A  Cardiovascular: N/A  Ear/Nose/Throat:  Skin: rash  Eyes: N/A  Respiratory: N/A  Gastroitestinal: N/A  Genitourinary: N/A Hematology/Lymphatic: N/A  Endocrine: N/A  Musculoskeletal: Neck pain, back pain, neck stiffness  Allergy/Immunology: N/A  Neurological: N/A  Psychiatric: frequent waking, snoring  The following represents the patient's updated allergies and side effects list: No Known Allergies  The neurologically relevant items on the patient's problem list were reviewed on today's visit.  Neurologic Examination  A problem focused neurological exam (12 or more points of the single system neurologic examination, vital signs counts as 1 point, cranial nerves count for 8 points) was performed.  Blood pressure 95/61, pulse 60, height 6\' 1"  (1.854 m), weight 245 lb 9.6 oz (111.403 kg).  General - Well nourished, well developed, in no apparent distress.  Ophthalmologic - not able to see through.  Cardiovascular - Regular rate and rhythm with no murmur.  Mental Status -  Level of arousal and orientation to time, place, and person were intact. Language  including expression, naming, repetition, comprehension was assessed and found intact.  Cranial Nerves II - XII - II - Visual field intact OU. III, IV, VI - Extraocular movements intact. V - Facial sensation intact bilaterally. VII - Facial movement intact bilaterally. VIII - Hearing & vestibular intact bilaterally. X - Palate elevates symmetrically. XI - Chin turning & shoulder shrug intact bilaterally. XII - Tongue protrusion intact.  Motor Strength - The patient's strength was normal in all extremities and pronator drift was absent.  Bulk was normal and fasciculations were absent.   Motor Tone - Muscle tone was assessed at the neck and appendages and was normal.  Reflexes - The patient's reflexes were normal in all extremities and he had no pathological reflexes.  Sensory - Light touch, temperature/pinprick, vibration and proprioception, and Romberg testing were assessed and were normal.    Coordination - The patient had normal movements in the hands and feet with no ataxia or dysmetria.  Tremor was absent.  Gait and Station - The patient's transfers, posture, gait, station, and turns were observed as normal.  Data reviewed: I personally reviewed the images and agree with the radiology interpretations.  Ct  Head Wo Contrast  09/30/14 - Abnormal CT head (without) demonstrating: 1. Improving bifrontal and left anterior temporal subdural hematomas, with overlying left frontal craniotomy. This measures maximum of 7-25mm in thickness over the left frontal regions.  2. Compared to prior CT on 09/11/14, there has been interval left frontal craniotomy and drainage of subdural hematoma, with slight interval reduction in size of subdural hematomas with expected evolutional changes.  09/11/2014  IMPRESSION: Bilateral subdural hematomas are stable in size. Continued liquefaction of left-sided subdural hematoma. 6 mm midline shift to the right is stable.  09/10/2014   IMPRESSION: Left-sided  subdural hematoma slightly larger. No definite repeat hemorrhage is identified. 1 mm more of midline shift to the right compared with the prior study.  Small right frontal subdural hematoma unchanged. Interhemispheric subdural hematoma slightly improved.      09/07/2014   IMPRESSION: Bilateral subdural hematomas unchanged. Mild midline shift to the right unchanged. No new hemorrhage.     09/06/2014   IMPRESSION: 1. Bilateral and parafalcine acute subdural hematomas are identified. 2. Small vessel ischemic change and brain atrophy.    Mri and Mra Head Wo Contrast  09/09/2014   IMPRESSION: Subacute subdural hematomas bilaterally are stable, measuring up to 11 mm in thickness on the left. No new hemorrhage.  No acute infarct  Negative MRA head.    2D echo 07/19/14 - - Normal biventricular size and function. Moderate concentric LVH with abnormal relaxation and normal filling pressures. Mild tricuspid regurgitation. Normal RVSP.  EEG 09/07/14 - Normal electroencephalogram, awake, asleep and with activation  procedures. There are no focal lateralizing or epileptiform features.  Component     Latest Ref Rng 09/30/2014  Cholesterol     0 - 200 mg/dL 89  Triglycerides     0.0 - 149.0 mg/dL 51.0  HDL     >39.00 mg/dL 32.80 (L)  VLDL     0.0 - 40.0 mg/dL 10.2  LDL (calc)     0 - 99 mg/dL 46  Total CHOL/HDL Ratio      3  NonHDL      56.20  Hgb A1c MFr Bld     4.6 - 6.5 % 6.5    Assessment: As you may recall, he is a 69 y.o. Caucasian male with PMH of CAD s/p stent in 04/2014 on ASA and brilinta, right SDH s/p fall with seizure in 03/2014 put on keppra was admitted again in 08/2014 for left SDH s/p fall. Had slight enlargement of left SDH over days and frequent focal seizure with aphasia and right cheek numbness. Increased keppra dose to 1000mg  bid. Had SDH evacuation on 09/12/14. Discharged with ASA 81mg  but brilinta stopped. He is clinically doing well after discharge and followed up in  clinic in 09/2014, repeat CT showed evolving left SDH. Followed up with cardiology and OK with only on baby ASA. No seizures after discharge. During the interval time, he is doing OK, no recurrent seizure and resumed driving.   Plan:  - continue ASA 81mg  and lipitor for stroke and cardiac prevention. - continue keppra 1000mg  bid. - OK to resume driving provided he is on the seizure medication and under physician's care.  - Maintain seizure precautions.  - Follow up with your primary care physician for stroke risk factor modification. Recommend maintain blood pressure goal <130/80, diabetes with hemoglobin A1c goal below 6.5% and lipids with LDL cholesterol goal below 70 mg/dL.  - check BP and glucose at home - RTC in 6 months.  No orders of the defined types were placed in this encounter.    No orders of the defined types were placed in this encounter.    Patient Instructions  - continue ASA 81mg  and lipitor for stroke and cardiac prevention. - continue keppra 1000mg  bid for seizure control. - you have seizure free for more than 6 months and you can resume driving provided you are on the seizure medication and under physician's care - Maintain seizure precautions - Do not participate in activities where a loss of awareness could hurt you or someone else.  No swimming alone, no tub bathing, no hot tubs.  No standing at heights, such as rooftops, ladders or stairs. No sliding boards, monkey bars or swings, or climbing trees.  Avoid hot objects such as stoves, heaters, open fires.  Wear a helmet when riding a bicycle, scooter, skateboard, etc. and avoid areas of traffic.  Set water heater to 120 degrees or less. - Follow up with your primary care physician for stroke risk factor modification. Recommend maintain blood pressure goal <130/80, diabetes with hemoglobin A1c goal below 6.5% and lipids with LDL cholesterol goal below 70 mg/dL.  - check BP and glucose at home - follow up in 6  months.     Rosalin Hawking, MD PhD [facility] [address] [place], [place] [address] [phone]

## 2015-04-25 ENCOUNTER — Encounter (HOSPITAL_COMMUNITY)
Admission: RE | Admit: 2015-04-25 | Discharge: 2015-04-25 | Disposition: A | Discharge status: Home / Self Care | Payer: Self-pay | Source: Ambulatory Visit | Attending: Cardiology | Admitting: Cardiology

## 2015-04-26 ENCOUNTER — Encounter (HOSPITAL_COMMUNITY)
Admission: RE | Admit: 2015-04-26 | Discharge: 2015-04-26 | Disposition: A | Discharge status: Home / Self Care | Payer: Self-pay | Source: Ambulatory Visit | Attending: Cardiology | Admitting: Cardiology

## 2015-04-27 ENCOUNTER — Encounter (HOSPITAL_COMMUNITY)
Admission: RE | Admit: 2015-04-27 | Discharge: 2015-04-27 | Disposition: A | Discharge status: Home / Self Care | Payer: Self-pay | Source: Ambulatory Visit | Attending: Cardiology | Admitting: Cardiology

## 2015-04-28 ENCOUNTER — Encounter (HOSPITAL_COMMUNITY)
Admission: RE | Admit: 2015-04-28 | Discharge: 2015-04-28 | Disposition: A | Discharge status: Home / Self Care | Payer: Self-pay | Source: Ambulatory Visit | Attending: Cardiology | Admitting: Cardiology

## 2015-05-01 ENCOUNTER — Encounter (HOSPITAL_COMMUNITY)
Admission: RE | Admit: 2015-05-01 | Discharge: 2015-05-01 | Disposition: A | Discharge status: Home / Self Care | Payer: Self-pay | Source: Ambulatory Visit | Attending: Cardiology | Admitting: Cardiology

## 2015-05-02 ENCOUNTER — Encounter (HOSPITAL_COMMUNITY): Payer: Self-pay

## 2015-05-02 ENCOUNTER — Telehealth: Payer: Self-pay | Admitting: *Deleted

## 2015-05-02 ENCOUNTER — Ambulatory Visit: Payer: Managed Care, Other (non HMO) | Admitting: Family Medicine

## 2015-05-02 NOTE — Telephone Encounter (Signed)
Maintenance orders faxed

## 2015-05-04 ENCOUNTER — Encounter (HOSPITAL_COMMUNITY): Payer: Self-pay

## 2015-05-08 ENCOUNTER — Encounter (HOSPITAL_COMMUNITY): Payer: Self-pay

## 2015-05-09 ENCOUNTER — Encounter (HOSPITAL_COMMUNITY)
Admission: RE | Admit: 2015-05-09 | Discharge: 2015-05-09 | Disposition: A | Discharge status: Home / Self Care | Payer: Self-pay | Source: Ambulatory Visit | Attending: Cardiology | Admitting: Cardiology

## 2015-05-10 DIAGNOSIS — C44629 Squamous cell carcinoma of skin of left upper limb, including shoulder: Secondary | ICD-10-CM | POA: Diagnosis not present

## 2015-05-10 DIAGNOSIS — L570 Actinic keratosis: Secondary | ICD-10-CM | POA: Diagnosis not present

## 2015-05-11 ENCOUNTER — Encounter: Payer: Self-pay | Admitting: Family Medicine

## 2015-05-11 ENCOUNTER — Other Ambulatory Visit (INDEPENDENT_AMBULATORY_CARE_PROVIDER_SITE_OTHER): Payer: Managed Care, Other (non HMO)

## 2015-05-11 ENCOUNTER — Ambulatory Visit (INDEPENDENT_AMBULATORY_CARE_PROVIDER_SITE_OTHER): Payer: Managed Care, Other (non HMO) | Admitting: Family Medicine

## 2015-05-11 ENCOUNTER — Encounter (HOSPITAL_COMMUNITY)
Admission: RE | Admit: 2015-05-11 | Discharge: 2015-05-11 | Disposition: A | Discharge status: Home / Self Care | Payer: Self-pay | Source: Ambulatory Visit | Attending: Cardiology | Admitting: Cardiology

## 2015-05-11 VITALS — BP 94/66 | HR 72 | Ht 73.0 in | Wt 247.0 lb

## 2015-05-11 DIAGNOSIS — M25512 Pain in left shoulder: Secondary | ICD-10-CM

## 2015-05-11 DIAGNOSIS — I2510 Atherosclerotic heart disease of native coronary artery without angina pectoris: Secondary | ICD-10-CM

## 2015-05-11 DIAGNOSIS — M25511 Pain in right shoulder: Secondary | ICD-10-CM | POA: Diagnosis not present

## 2015-05-11 NOTE — Patient Instructions (Signed)
Good to see you Ice is your friend Continue exercises 3 times a week You can tyr the KT tape Try the pennsaid twice daily to tailbone See me again in 3 weeks

## 2015-05-11 NOTE — Assessment & Plan Note (Signed)
She was given an injection and tolerated the procedure very well with increasing range of motion of the right shoulder immediately. I think that this is very beneficial. I think patient will do very well. Patient encouraged to do home exercises and continue with the anti-inflammatory's as needed. Patient will try these different changes and come back and see me again in 3 weeks and at that time if continuing have trouble we will try injection on the contralateral shoulder.

## 2015-05-11 NOTE — Progress Notes (Signed)
Pre visit review using our clinic review tool, if applicable. No additional management support is needed unless otherwise documented below in the visit note. 

## 2015-05-11 NOTE — Progress Notes (Signed)
[name] [address], [place] [address] Phone: [phone] Subjective:     CC: bilateral shoulder pain . Follow up  [id number] Allen Myers is a 69 y.o. male coming in with complaint of bilateral shoulder pain. Patient has had this pain for quite some time. Patient was found to have some mild bursitis of the shoulders bilaterally. Patient did have adjustment of his thyroid medication by primary care provider. Patient was given home exercises, icing protocol, and patient states he is not made any significant improvement at this time. Patient has been trying over-the-counter medications and has been doing the exercises but just states that they seem to be sore. States that he continues to have more weakness of the shoulders bilaterally. Patient denies any radiation down the arm or any numbness. Patient states though that could be uncomfortable at night.     Past Medical History  Diagnosis Date  . Hyperlipidemia   . History of colon polyps   . History of diverticulitis   . Erectile dysfunction   . HYPERTENSION   . SDH (subdural hematoma)     Following syncopal episode  . SAH (subarachnoid hemorrhage)     Following syncopal episode  . Kidney stones     "passed it"  . Sleep apnea in adult   . Hypothyroidism   . Depression   . Type II diabetes mellitus   . Seizures     just once a few weeks ago" (05/11/2014)   Past Surgical History  Procedure Laterality Date  . Colonoscopy    . Polypectomy    . Coronary angioplasty with stent placement  05/11/2014    "1"  . Craniotomy Left 09/12/2014    Procedure: CRANIOTOMY HEMATOMA EVACUATION SUBDURAL;  Surgeon: Erline Levine, MD;  Location: [facility];  Service: Neurosurgery;  Laterality: Left;  . Left heart catheterization with coronary angiogram N/A 05/11/2014    Procedure: LEFT HEART CATHETERIZATION WITH CORONARY ANGIOGRAM;  Surgeon: [name], MD;  Location: [facility] CATH LAB;  Service:  Cardiovascular;  Laterality: N/A;  . Fractional flow reserve wire  05/11/2014    Procedure: FRACTIONAL FLOW RESERVE WIRE;  Surgeon: [name], MD;  Location: [facility] CATH LAB;  Service: Cardiovascular;;   History  Substance Use Topics  . Smoking status: Former Smoker -- 1.00 packs/day for 30 years    Types: Cigarettes    Quit date: 10/15/1983  . Smokeless tobacco: Never Used  . Alcohol Use: No   No Known Allergies  Family History  Problem Relation Age of Onset  . Cancer Brother     prostate cancer  . Cancer Cousin     lung cancer  . Colon cancer Neg Hx   . Rectal cancer Neg Hx   . Stomach cancer Neg Hx   . Lung cancer Father     dad was a smoker  . Heart disease Father   . Heart disease Mother      Past medical history, social, surgical and family history all reviewed in electronic medical record.   Review of Systems: No headache, visual changes, nausea, vomiting, diarrhea, constipation, dizziness, abdominal pain, skin rash, fevers, chills, night sweats, weight loss, swollen lymph nodes, body aches, joint swelling, muscle aches, chest pain, shortness of breath, mood changes.   Objective Blood pressure 94/66, pulse 72, height 6\' 1"  (1.854 m), weight 247 lb (112.038 kg), SpO2 94 %.  General: No apparent distress alert and oriented x3 mood and affect  normal, dressed appropriately.  HEENT: Pupils equal, extraocular movements intact  Respiratory: Patient's speak in full sentences and does not appear short of breath  Cardiovascular: No lower extremity edema, non tender, no erythema  Skin: Warm dry intact with no signs of infection or rash on extremities or on axial skeleton.  Abdomen: Soft nontender  Neuro: Cranial nerves II through XII are intact, neurovascularly intact in all extremities with 2+ DTRs and 2+ pulses.  Lymph: No lymphadenopathy of posterior or anterior cervical chain or axillae bilaterally.  Gait normal with good balance and coordination.  MSK:  Non tender  with full range of motion and good stability and symmetric strength and tone of  elbows, wrist, hip, knee and ankles bilaterally.  Neck: Inspection unremarkable. No palpable stepoffs. Negative Spurling's maneuver. Mild decrease in range of motion especially lacking the last 10 of extension as well as 10 side bending bilaterally Grip strength and sensation normal in bilateral hands Strength good C4 to T1 distribution No sensory change to C4 to T1 Negative Hoffman sign bilaterally Reflexes normal  Shoulder: Bilateral Inspection reveals no abnormalities, atrophy or asymmetry. Palpation is normal with no tenderness over AC joint or bicipital groove. Limited range of motion with internal rotation bilaterally right greater than left as is new from previous exam. Rotator cuff strength normal throughout. Mild impingement bilaterally with positive Neer's and Hawkins Speeds and Yergason's tests normal. No labral pathology noted with negative Obrien's, negative clunk and good stability. Normal scapular function observed. No painful arc and no drop arm sign. No apprehension sign  Procedure: Real-time Ultrasound Guided Injection of right glenohumeral joint Device: GE Logiq E  Ultrasound guided injection is preferred based studies that show increased duration, increased effect, greater accuracy, decreased procedural pain, increased response rate with ultrasound guided versus blind injection.  Verbal informed consent obtained.  Time-out conducted.  Noted no overlying erythema, induration, or other signs of local infection.  Skin prepped in a sterile fashion.  Local anesthesia: Topical Ethyl chloride.  With sterile technique and under real time ultrasound guidance:  Joint visualized.  23g 1  inch needle inserted posterior approach. Pictures taken for needle placement. Patient did have injection of 2 cc of 1% lidocaine, 2 cc of 0.5% Marcaine, and 1.0 cc of Kenalog 40 mg/dL. Completed without  difficulty  Pain immediately resolved suggesting accurate placement of the medication.  Advised to call if fevers/chills, erythema, induration, drainage, or persistent bleeding.  Images permanently stored and available for review in the ultrasound unit.  Impression: Technically successful ultrasound guided injection.     Impression and Recommendations:     This case required medical decision making of moderate complexity.

## 2015-05-12 ENCOUNTER — Encounter (HOSPITAL_COMMUNITY)
Admission: RE | Admit: 2015-05-12 | Discharge: 2015-05-12 | Disposition: A | Discharge status: Home / Self Care | Payer: Self-pay | Source: Ambulatory Visit | Attending: Cardiology | Admitting: Cardiology

## 2015-05-15 ENCOUNTER — Encounter (HOSPITAL_COMMUNITY): Payer: Self-pay

## 2015-05-15 DIAGNOSIS — Z48812 Encounter for surgical aftercare following surgery on the circulatory system: Secondary | ICD-10-CM | POA: Insufficient documentation

## 2015-05-15 DIAGNOSIS — Z955 Presence of coronary angioplasty implant and graft: Secondary | ICD-10-CM | POA: Insufficient documentation

## 2015-05-16 ENCOUNTER — Encounter (HOSPITAL_COMMUNITY)
Admission: RE | Admit: 2015-05-16 | Discharge: 2015-05-16 | Disposition: A | Discharge status: Home / Self Care | Payer: Self-pay | Source: Ambulatory Visit | Attending: Cardiology | Admitting: Cardiology

## 2015-05-18 ENCOUNTER — Encounter (HOSPITAL_COMMUNITY): Payer: Self-pay

## 2015-05-22 ENCOUNTER — Encounter (HOSPITAL_COMMUNITY): Payer: Self-pay

## 2015-05-23 ENCOUNTER — Encounter (HOSPITAL_COMMUNITY): Payer: Self-pay

## 2015-05-24 ENCOUNTER — Encounter (HOSPITAL_COMMUNITY)
Admission: RE | Admit: 2015-05-24 | Discharge: 2015-05-24 | Disposition: A | Discharge status: Home / Self Care | Payer: Self-pay | Source: Ambulatory Visit | Attending: Cardiology | Admitting: Cardiology

## 2015-05-25 ENCOUNTER — Encounter (HOSPITAL_COMMUNITY)
Admission: RE | Admit: 2015-05-25 | Discharge: 2015-05-25 | Disposition: A | Discharge status: Home / Self Care | Payer: Self-pay | Source: Ambulatory Visit | Attending: Cardiology | Admitting: Cardiology

## 2015-05-26 ENCOUNTER — Encounter (HOSPITAL_COMMUNITY)
Admission: RE | Admit: 2015-05-26 | Discharge: 2015-05-26 | Disposition: A | Discharge status: Home / Self Care | Payer: Self-pay | Source: Ambulatory Visit | Attending: Cardiology | Admitting: Cardiology

## 2015-05-29 ENCOUNTER — Encounter (HOSPITAL_COMMUNITY): Payer: Self-pay

## 2015-05-30 ENCOUNTER — Encounter (HOSPITAL_COMMUNITY): Payer: Self-pay

## 2015-05-31 ENCOUNTER — Encounter (HOSPITAL_COMMUNITY)
Admission: RE | Admit: 2015-05-31 | Discharge: 2015-05-31 | Disposition: A | Discharge status: Home / Self Care | Payer: Self-pay | Source: Ambulatory Visit | Attending: Cardiology | Admitting: Cardiology

## 2015-06-01 ENCOUNTER — Ambulatory Visit: Payer: Managed Care, Other (non HMO) | Admitting: Family Medicine

## 2015-06-01 ENCOUNTER — Ambulatory Visit: Payer: Managed Care, Other (non HMO) | Admitting: Internal Medicine

## 2015-06-01 ENCOUNTER — Encounter (HOSPITAL_COMMUNITY): Payer: Self-pay

## 2015-06-05 ENCOUNTER — Encounter (HOSPITAL_COMMUNITY): Payer: Self-pay

## 2015-06-06 ENCOUNTER — Encounter (HOSPITAL_COMMUNITY)
Admission: RE | Admit: 2015-06-06 | Discharge: 2015-06-06 | Disposition: A | Discharge status: Home / Self Care | Payer: Self-pay | Source: Ambulatory Visit | Attending: Cardiology | Admitting: Cardiology

## 2015-06-07 ENCOUNTER — Ambulatory Visit (INDEPENDENT_AMBULATORY_CARE_PROVIDER_SITE_OTHER): Payer: Managed Care, Other (non HMO) | Admitting: Family Medicine

## 2015-06-07 VITALS — BP 96/60 | HR 64 | Ht 73.0 in | Wt 245.0 lb

## 2015-06-07 DIAGNOSIS — M25511 Pain in right shoulder: Secondary | ICD-10-CM | POA: Diagnosis not present

## 2015-06-07 DIAGNOSIS — I2510 Atherosclerotic heart disease of native coronary artery without angina pectoris: Secondary | ICD-10-CM

## 2015-06-07 DIAGNOSIS — M25512 Pain in left shoulder: Secondary | ICD-10-CM

## 2015-06-08 ENCOUNTER — Encounter (HOSPITAL_COMMUNITY)
Admission: RE | Admit: 2015-06-08 | Discharge: 2015-06-08 | Disposition: A | Discharge status: Home / Self Care | Payer: Self-pay | Source: Ambulatory Visit | Attending: Cardiology | Admitting: Cardiology

## 2015-06-08 ENCOUNTER — Encounter: Payer: Self-pay | Admitting: Family Medicine

## 2015-06-08 NOTE — Patient Instructions (Signed)
Removal instructions given

## 2015-06-08 NOTE — Assessment & Plan Note (Signed)
Patient given injection of the left shoulder. Encourage patient to continue the exercises for the shoulders bilaterally 3 times a week. We discussed monitoring her blood sugars closely over the course the next 3 days. We discussed the icing regimen. We discussed why we do not want to do the injections any greater than a repeat 3-4 months if necessary. If patient continues to have discomfort I do feel further imaging such as x-rays may be necessary. Patient did respond well to the injections which likely means that this is not cervical radiculopathy but we'll keep her within the differential. Patient declined formal physical therapy. Patient will come back and see me again in 3-4 weeks for further evaluation and treatment.  Spent  25 minutes with patient face-to-face and had greater than 50% of counseling including as described above in assessment and plan.

## 2015-06-08 NOTE — Progress Notes (Signed)
[name] [address] [place], [place] [address] Phone: [phone] Subjective:     CC: bilateral shoulder pain . Follow up  [id number] Allen Myers is a 69 y.o. male coming in with complaint of bilateral shoulder pain.  Patient was seen previously and patient did have significant amount pain more right than the left. Patient was given an injection in the right shoulder. Patient states that the pain and almost completely resolved now. States that he is about 90% better shoulder. States that he has more range of motion and states that he is sleeping more comfortable. Still some discomfort with certain movements. Patient states now the left side seems to be worse. More tightness than anything else. Patient would like to be near pain-free and continues to remain very active. States that the left side is what is affecting his daily activities at this time.     Past Medical History  Diagnosis Date  . Hyperlipidemia   . History of colon polyps   . History of diverticulitis   . Erectile dysfunction   . HYPERTENSION   . SDH (subdural hematoma)     Following syncopal episode  . SAH (subarachnoid hemorrhage)     Following syncopal episode  . Kidney stones     "passed it"  . Sleep apnea in adult   . Hypothyroidism   . Depression   . Type II diabetes mellitus   . Seizures     just once a few weeks ago" (05/11/2014)   Past Surgical History  Procedure Laterality Date  . Colonoscopy    . Polypectomy    . Coronary angioplasty with stent placement  05/11/2014    "1"  . Craniotomy Left 09/12/2014    Procedure: CRANIOTOMY HEMATOMA EVACUATION SUBDURAL;  Surgeon: Erline Levine, MD;  Location: [facility];  Service: Neurosurgery;  Laterality: Left;  . Left heart catheterization with coronary angiogram N/A 05/11/2014    Procedure: LEFT HEART CATHETERIZATION WITH CORONARY ANGIOGRAM;  Surgeon: [name], MD;  Location: [facility] CATH LAB;  Service:  Cardiovascular;  Laterality: N/A;  . Fractional flow reserve wire  05/11/2014    Procedure: FRACTIONAL FLOW RESERVE WIRE;  Surgeon: [name], MD;  Location: [facility] CATH LAB;  Service: Cardiovascular;;   Social History  Substance Use Topics  . Smoking status: Former Smoker -- 1.00 packs/day for 30 years    Types: Cigarettes    Quit date: 10/15/1983  . Smokeless tobacco: Never Used  . Alcohol Use: No   No Known Allergies  Family History  Problem Relation Age of Onset  . Cancer Brother     prostate cancer  . Cancer Cousin     lung cancer  . Colon cancer Neg Hx   . Rectal cancer Neg Hx   . Stomach cancer Neg Hx   . Lung cancer Father     dad was a smoker  . Heart disease Father   . Heart disease Mother      Past medical history, social, surgical and family history all reviewed in electronic medical record.   Review of Systems: No headache, visual changes, nausea, vomiting, diarrhea, constipation, dizziness, abdominal pain, skin rash, fevers, chills, night sweats, weight loss, swollen lymph nodes, body aches, joint swelling, muscle aches, chest pain, shortness of breath, mood changes.   Objective Blood pressure 96/60, pulse 64, height 6\' 1"  (1.854 m), weight 245 lb (111.131 kg), SpO2 97 %.  General: No apparent distress alert and  oriented x3 mood and affect normal, dressed appropriately.  HEENT: Pupils equal, extraocular movements intact  Respiratory: Patient's speak in full sentences and does not appear short of breath  Cardiovascular: No lower extremity edema, non tender, no erythema  Skin: Warm dry intact with no signs of infection or rash on extremities or on axial skeleton.  Abdomen: Soft nontender  Neuro: Cranial nerves II through XII are intact, neurovascularly intact in all extremities with 2+ DTRs and 2+ pulses.  Lymph: No lymphadenopathy of posterior or anterior cervical chain or axillae bilaterally.  Gait normal with good balance and coordination.  MSK:  Non  tender with full range of motion and good stability and symmetric strength and tone of  elbows, wrist, hip, knee and ankles bilaterally.  Neck: Inspection unremarkable. No palpable stepoffs. Negative Spurling's maneuver. Mild decrease in range of motion especially lacking the last 10 of extension as well as 10 side bending bilaterally Grip strength and sensation normal in bilateral hands Strength good C4 to T1 distribution No sensory change to C4 to T1 Negative Hoffman sign bilaterally Reflexes normal  Shoulder: Bilateral Inspection reveals no abnormalities, atrophy or asymmetry. Palpation is normal with no tenderness over AC joint or bicipital groove. Limited range of motion with internal rotation bilaterally but now left greater than right Rotator cuff strength normal throughout. Mild impingement bilaterally with positive Neer's and Hawkins only on left side with near full range of motion on right side. Speeds and Yergason's tests normal. No labral pathology noted with negative Obrien's, negative clunk and good stability. Normal scapular function observed. No painful arc and no drop arm sign. No apprehension sign  After informed written and verbal consent, patient was seated on exam table. Left shoulder was prepped with alcohol swab and utilizing posterior approach, patient's right glenohumeral space was injected with 4:1  marcaine 0.5%: Kenalog 40mg /dL. Patient tolerated the procedure well without immediate complications.    Impression and Recommendations:     This case required medical decision making of moderate complexity.

## 2015-06-08 NOTE — Progress Notes (Signed)
Pre visit review using our clinic review tool, if applicable. No additional management support is needed unless otherwise documented below in the visit note. 

## 2015-06-09 ENCOUNTER — Encounter (HOSPITAL_COMMUNITY)
Admission: RE | Admit: 2015-06-09 | Discharge: 2015-06-09 | Disposition: A | Discharge status: Home / Self Care | Payer: Self-pay | Source: Ambulatory Visit | Attending: Cardiology | Admitting: Cardiology

## 2015-06-12 ENCOUNTER — Encounter (HOSPITAL_COMMUNITY): Payer: Self-pay

## 2015-06-13 ENCOUNTER — Encounter (HOSPITAL_COMMUNITY)
Admission: RE | Admit: 2015-06-13 | Discharge: 2015-06-13 | Disposition: A | Discharge status: Home / Self Care | Payer: Self-pay | Source: Ambulatory Visit | Attending: Cardiology | Admitting: Cardiology

## 2015-06-15 ENCOUNTER — Encounter (HOSPITAL_COMMUNITY)
Admission: RE | Admit: 2015-06-15 | Discharge: 2015-06-15 | Disposition: A | Discharge status: Home / Self Care | Payer: Self-pay | Source: Ambulatory Visit | Attending: Cardiology | Admitting: Cardiology

## 2015-06-15 DIAGNOSIS — Z48812 Encounter for surgical aftercare following surgery on the circulatory system: Secondary | ICD-10-CM | POA: Insufficient documentation

## 2015-06-15 DIAGNOSIS — Z955 Presence of coronary angioplasty implant and graft: Secondary | ICD-10-CM | POA: Insufficient documentation

## 2015-06-16 ENCOUNTER — Encounter (HOSPITAL_COMMUNITY)
Admission: RE | Admit: 2015-06-16 | Discharge: 2015-06-16 | Disposition: A | Discharge status: Home / Self Care | Payer: Self-pay | Source: Ambulatory Visit | Attending: Cardiology | Admitting: Cardiology

## 2015-06-20 ENCOUNTER — Encounter (HOSPITAL_COMMUNITY)
Admission: RE | Admit: 2015-06-20 | Discharge: 2015-06-20 | Disposition: A | Discharge status: Home / Self Care | Payer: Self-pay | Source: Ambulatory Visit | Attending: Cardiology | Admitting: Cardiology

## 2015-06-22 ENCOUNTER — Encounter (HOSPITAL_COMMUNITY)
Admission: RE | Admit: 2015-06-22 | Discharge: 2015-06-22 | Disposition: A | Discharge status: Home / Self Care | Payer: Self-pay | Source: Ambulatory Visit | Attending: Cardiology | Admitting: Cardiology

## 2015-06-23 ENCOUNTER — Encounter (HOSPITAL_COMMUNITY)
Admission: RE | Admit: 2015-06-23 | Discharge: 2015-06-23 | Disposition: A | Discharge status: Home / Self Care | Payer: Self-pay | Source: Ambulatory Visit | Attending: Cardiology | Admitting: Cardiology

## 2015-06-26 ENCOUNTER — Encounter (HOSPITAL_COMMUNITY): Payer: Self-pay

## 2015-06-27 ENCOUNTER — Encounter (HOSPITAL_COMMUNITY)
Admission: RE | Admit: 2015-06-27 | Discharge: 2015-06-27 | Disposition: A | Discharge status: Home / Self Care | Payer: Self-pay | Source: Ambulatory Visit | Attending: Cardiology | Admitting: Cardiology

## 2015-06-29 ENCOUNTER — Encounter (HOSPITAL_COMMUNITY)
Admission: RE | Admit: 2015-06-29 | Discharge: 2015-06-29 | Disposition: A | Discharge status: Home / Self Care | Payer: Self-pay | Source: Ambulatory Visit | Attending: Cardiology | Admitting: Cardiology

## 2015-06-29 ENCOUNTER — Other Ambulatory Visit: Payer: Self-pay | Admitting: Cardiology

## 2015-06-29 ENCOUNTER — Other Ambulatory Visit: Payer: Self-pay | Admitting: Internal Medicine

## 2015-06-30 ENCOUNTER — Encounter (HOSPITAL_COMMUNITY)
Admission: RE | Admit: 2015-06-30 | Discharge: 2015-06-30 | Disposition: A | Discharge status: Home / Self Care | Payer: Self-pay | Source: Ambulatory Visit | Attending: Cardiology | Admitting: Cardiology

## 2015-07-03 ENCOUNTER — Encounter (HOSPITAL_COMMUNITY): Payer: Self-pay

## 2015-07-04 ENCOUNTER — Encounter (HOSPITAL_COMMUNITY)
Admission: RE | Admit: 2015-07-04 | Discharge: 2015-07-04 | Disposition: A | Discharge status: Home / Self Care | Payer: Self-pay | Source: Ambulatory Visit | Attending: Cardiology | Admitting: Cardiology

## 2015-07-06 ENCOUNTER — Encounter (HOSPITAL_COMMUNITY)
Admission: RE | Admit: 2015-07-06 | Discharge: 2015-07-06 | Disposition: A | Discharge status: Home / Self Care | Payer: Self-pay | Source: Ambulatory Visit | Attending: Cardiology | Admitting: Cardiology
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
## 2015-07-10 ENCOUNTER — Encounter (HOSPITAL_COMMUNITY): Payer: Self-pay

## 2015-07-11 ENCOUNTER — Encounter (HOSPITAL_COMMUNITY)
Admission: RE | Admit: 2015-07-11 | Discharge: 2015-07-11 | Disposition: A | Discharge status: Home / Self Care | Payer: Self-pay | Source: Ambulatory Visit | Attending: Cardiology | Admitting: Cardiology

## 2015-07-12 ENCOUNTER — Encounter (HOSPITAL_COMMUNITY)
Admission: RE | Admit: 2015-07-12 | Discharge: 2015-07-12 | Disposition: A | Discharge status: Home / Self Care | Payer: Self-pay | Source: Ambulatory Visit | Attending: Cardiology | Admitting: Cardiology

## 2015-07-13 ENCOUNTER — Ambulatory Visit (INDEPENDENT_AMBULATORY_CARE_PROVIDER_SITE_OTHER): Payer: Managed Care, Other (non HMO) | Admitting: Cardiology

## 2015-07-13 ENCOUNTER — Encounter (HOSPITAL_COMMUNITY): Payer: Self-pay

## 2015-07-13 ENCOUNTER — Encounter: Payer: Self-pay | Admitting: Cardiology

## 2015-07-13 VITALS — BP 96/50 | HR 66 | Ht 73.0 in | Wt 246.8 lb

## 2015-07-13 DIAGNOSIS — E785 Hyperlipidemia, unspecified: Secondary | ICD-10-CM

## 2015-07-13 DIAGNOSIS — I714 Abdominal aortic aneurysm, without rupture, unspecified: Secondary | ICD-10-CM

## 2015-07-13 DIAGNOSIS — I2510 Atherosclerotic heart disease of native coronary artery without angina pectoris: Secondary | ICD-10-CM

## 2015-07-13 DIAGNOSIS — I420 Dilated cardiomyopathy: Secondary | ICD-10-CM | POA: Diagnosis not present

## 2015-07-13 DIAGNOSIS — I10 Essential (primary) hypertension: Secondary | ICD-10-CM

## 2015-07-13 NOTE — Assessment & Plan Note (Signed)
Continue statin. 

## 2015-07-13 NOTE — Assessment & Plan Note (Signed)
Follow-up abdominal ultrasound April 2017.

## 2015-07-13 NOTE — Assessment & Plan Note (Signed)
LV function improved on most recent echocardiogram. Continue present dose of carvedilol and lisinopril.

## 2015-07-13 NOTE — Patient Instructions (Signed)
Your physician wants you to follow-up in: ONE YEAR WITH DR CRENSHAW You will receive a reminder letter in the mail two months in advance. If you don't receive a letter, please call our office to schedule the follow-up appointment.  

## 2015-07-13 NOTE — Assessment & Plan Note (Signed)
Continue aspirin and statin. 

## 2015-07-13 NOTE — Assessment & Plan Note (Signed)
Blood pressure controlled. Continue present medications. 

## 2015-07-14 ENCOUNTER — Encounter (HOSPITAL_COMMUNITY)
Admission: RE | Admit: 2015-07-14 | Discharge: 2015-07-14 | Disposition: A | Discharge status: Home / Self Care | Payer: Self-pay | Source: Ambulatory Visit | Attending: Cardiology | Admitting: Cardiology

## 2015-07-17 ENCOUNTER — Encounter (HOSPITAL_COMMUNITY): Payer: Self-pay

## 2015-07-17 DIAGNOSIS — Z48812 Encounter for surgical aftercare following surgery on the circulatory system: Secondary | ICD-10-CM | POA: Insufficient documentation

## 2015-07-17 DIAGNOSIS — Z955 Presence of coronary angioplasty implant and graft: Secondary | ICD-10-CM | POA: Insufficient documentation

## 2015-07-18 ENCOUNTER — Encounter (HOSPITAL_COMMUNITY)
Admission: RE | Admit: 2015-07-18 | Discharge: 2015-07-18 | Disposition: A | Discharge status: Home / Self Care | Payer: Self-pay | Source: Ambulatory Visit | Attending: Cardiology | Admitting: Cardiology

## 2015-07-20 ENCOUNTER — Encounter (HOSPITAL_COMMUNITY): Payer: Self-pay

## 2015-07-21 ENCOUNTER — Encounter (HOSPITAL_COMMUNITY)
Admission: RE | Admit: 2015-07-21 | Discharge: 2015-07-21 | Disposition: A | Discharge status: Home / Self Care | Payer: Self-pay | Source: Ambulatory Visit | Attending: Cardiology | Admitting: Cardiology

## 2015-07-24 ENCOUNTER — Encounter (HOSPITAL_COMMUNITY): Payer: Self-pay

## 2015-07-25 ENCOUNTER — Encounter (HOSPITAL_COMMUNITY)
Admission: RE | Admit: 2015-07-25 | Discharge: 2015-07-25 | Disposition: A | Discharge status: Home / Self Care | Payer: Self-pay | Source: Ambulatory Visit | Attending: Cardiology | Admitting: Cardiology

## 2015-07-27 ENCOUNTER — Encounter (HOSPITAL_COMMUNITY)
Admission: RE | Admit: 2015-07-27 | Discharge: 2015-07-27 | Disposition: A | Discharge status: Home / Self Care | Payer: Self-pay | Source: Ambulatory Visit | Attending: Cardiology | Admitting: Cardiology

## 2015-07-31 ENCOUNTER — Encounter (HOSPITAL_COMMUNITY): Payer: Self-pay

## 2015-08-01 ENCOUNTER — Ambulatory Visit (INDEPENDENT_AMBULATORY_CARE_PROVIDER_SITE_OTHER): Payer: Managed Care, Other (non HMO) | Admitting: Family Medicine

## 2015-08-01 ENCOUNTER — Encounter: Payer: Self-pay | Admitting: Family Medicine

## 2015-08-01 ENCOUNTER — Other Ambulatory Visit (INDEPENDENT_AMBULATORY_CARE_PROVIDER_SITE_OTHER): Payer: Managed Care, Other (non HMO)

## 2015-08-01 ENCOUNTER — Encounter (HOSPITAL_COMMUNITY)
Admission: RE | Admit: 2015-08-01 | Discharge: 2015-08-01 | Disposition: A | Discharge status: Home / Self Care | Payer: Self-pay | Source: Ambulatory Visit | Attending: Cardiology | Admitting: Cardiology

## 2015-08-01 VITALS — BP 112/64 | HR 63 | Wt 249.0 lb

## 2015-08-01 DIAGNOSIS — M25512 Pain in left shoulder: Secondary | ICD-10-CM | POA: Diagnosis not present

## 2015-08-01 DIAGNOSIS — M25511 Pain in right shoulder: Secondary | ICD-10-CM | POA: Diagnosis not present

## 2015-08-01 NOTE — Progress Notes (Signed)
[name] [place], [place] [address] Phone: [phone] Subjective:     CC: bilateral shoulder pain . Follow up  [id number] LEMUEL BOODRAM is a 69 y.o. male coming in with complaint of bilateral shoulder pain. Patient 2 months ago did have an injection in his left shoulder. Patient's right shoulder was doing significantly better for quite some time. Patient states overall he is still doing significant a better. Patient though has noticed some mild increasing pain in the right shoulder. Patient had an injection greater than 3 months ago. Patient states that only pain he really is having his when he started to throw a ball and when he releases he has pain.      Past Medical History  Diagnosis Date  . Hyperlipidemia   . History of colon polyps   . History of diverticulitis   . Erectile dysfunction   . HYPERTENSION   . SDH (subdural hematoma) ([facility])     Following syncopal episode  . SAH (subarachnoid hemorrhage) ([place])     Following syncopal episode  . Kidney stones     "passed it"  . Sleep apnea in adult   . Hypothyroidism   . Depression   . Type II diabetes mellitus ([facility])   . Seizures ([facility])     just once a few weeks ago" (05/11/2014)   Past Surgical History  Procedure Laterality Date  . Colonoscopy    . Polypectomy    . Coronary angioplasty with stent placement  05/11/2014    "1"  . Craniotomy Left 09/12/2014    Procedure: CRANIOTOMY HEMATOMA EVACUATION SUBDURAL;  Surgeon: Erline Levine, MD;  Location: [facility];  Service: Neurosurgery;  Laterality: Left;  . Left heart catheterization with coronary angiogram N/A 05/11/2014    Procedure: LEFT HEART CATHETERIZATION WITH CORONARY ANGIOGRAM;  Surgeon: [name], MD;  Location: [facility] CATH LAB;  Service: Cardiovascular;  Laterality: N/A;  . Fractional flow reserve wire  05/11/2014    Procedure: FRACTIONAL FLOW RESERVE WIRE;  Surgeon: [name], MD;  Location: [facility] CATH LAB;   Service: Cardiovascular;;   Social History  Substance Use Topics  . Smoking status: Former Smoker -- 1.00 packs/day for 30 years    Types: Cigarettes    Quit date: 10/15/1983  . Smokeless tobacco: Never Used  . Alcohol Use: No   No Known Allergies  Family History  Problem Relation Age of Onset  . Cancer Brother     prostate cancer  . Cancer Cousin     lung cancer  . Colon cancer Neg Hx   . Rectal cancer Neg Hx   . Stomach cancer Neg Hx   . Lung cancer Father     dad was a smoker  . Heart disease Father   . Heart disease Mother      Past medical history, social, surgical and family history all reviewed in electronic medical record.   Review of Systems: No headache, visual changes, nausea, vomiting, diarrhea, constipation, dizziness, abdominal pain, skin rash, fevers, chills, night sweats, weight loss, swollen lymph nodes, body aches, joint swelling, muscle aches, chest pain, shortness of breath, mood changes.   Objective Blood pressure 112/64, pulse 63, weight 249 lb (112.946 kg), SpO2 94 %.  General: No apparent distress alert and oriented x3 mood and affect normal, dressed appropriately.  HEENT: Pupils equal, extraocular movements intact  Respiratory: Patient's speak in full sentences and does not appear short of breath  Cardiovascular:  No lower extremity edema, non tender, no erythema  Skin: Warm dry intact with no signs of infection or rash on extremities or on axial skeleton.  Abdomen: Soft nontender  Neuro: Cranial nerves II through XII are intact, neurovascularly intact in all extremities with 2+ DTRs and 2+ pulses.  Lymph: No lymphadenopathy of posterior or anterior cervical chain or axillae bilaterally.  Gait normal with good balance and coordination.  MSK:  Non tender with full range of motion and good stability and symmetric strength and tone of  elbows, wrist, hip, knee and ankles bilaterally.  Neck: Inspection unremarkable. No palpable stepoffs. Negative  Spurling's maneuver. Mild decrease in range of motion especially lacking the last 10 of extension as well as 10 side bending bilaterally Grip strength and sensation normal in bilateral hands Strength good C4 to T1 distribution No sensory change to C4 to T1 Negative Hoffman sign bilaterally Reflexes normal  Shoulder: Bilateral Inspection reveals no abnormalities, atrophy or asymmetry. Palpation is normal with no tenderness over AC joint or bicipital groove. Increasing range of motion from previous exam the patient is having more positive impingement on the right side. Rotator cuff strength normal throughout. Mild impingement bilaterally with positive Neer's and Hawkins only on left side with near full range of motion on right side. Speeds and Yergason's tests normal. No labral pathology noted with negative Obrien's, negative clunk and good stability. Normal scapular function observed. No painful arc and no drop arm sign. No apprehension sign  Procedure: Real-time Ultrasound Guided Injection of right glenohumeral joint Device: GE Logiq E  Ultrasound guided injection is preferred based studies that show increased duration, increased effect, greater accuracy, decreased procedural pain, increased response rate with ultrasound guided versus blind injection.  Verbal informed consent obtained.  Time-out conducted.  Noted no overlying erythema, induration, or other signs of local infection.  Skin prepped in a sterile fashion.  Local anesthesia: Topical Ethyl chloride.  With sterile technique and under real time ultrasound guidance:  Joint visualized.  23g 1  inch needle inserted posterior approach. Pictures taken for needle placement. Patient did have injection of 2 cc of 1% lidocaine, 2 cc of 0.5% Marcaine, and 1.0 cc of Kenalog 40 mg/dL. Completed without difficulty  Pain immediately resolved suggesting accurate placement of the medication.  Advised to call if fevers/chills, erythema,  induration, drainage, or persistent bleeding.  Images permanently stored and available for review in the ultrasound unit.  Impression: Technically successful ultrasound guided injection.     Impression and Recommendations:     This case required medical decision making of moderate complexity.

## 2015-08-01 NOTE — Progress Notes (Signed)
Pre visit review using our clinic review tool, if applicable. No additional management support is needed unless otherwise documented below in the visit note. 

## 2015-08-01 NOTE — Patient Instructions (Signed)
Good to see you You are doing great Take a little zip off the ball See me when you need me.

## 2015-08-01 NOTE — Assessment & Plan Note (Signed)
Patient given a right shoulder injection. I do think the patient has likely some rotator cuff arthropathy. Patient was doing very well and he do not think that further imaging would change management. Discussed with patient we can repeat injections every 3-4 months if necessary. Patient will remain active and continue do the exercises on a regular basis. Patient continue with the vitamin supplementation as well. Patient will come back and see me again  On  As needed.   Spent  25 minutes with patient face-to-face and had greater than 50% of counseling including as described above in assessment and plan.

## 2015-08-03 ENCOUNTER — Encounter (HOSPITAL_COMMUNITY)
Admission: RE | Admit: 2015-08-03 | Discharge: 2015-08-03 | Disposition: A | Discharge status: Home / Self Care | Payer: Self-pay | Source: Ambulatory Visit | Attending: Cardiology | Admitting: Cardiology

## 2015-08-07 ENCOUNTER — Ambulatory Visit (INDEPENDENT_AMBULATORY_CARE_PROVIDER_SITE_OTHER): Payer: Managed Care, Other (non HMO)

## 2015-08-07 ENCOUNTER — Encounter (HOSPITAL_COMMUNITY): Payer: Self-pay

## 2015-08-07 DIAGNOSIS — Z23 Encounter for immunization: Secondary | ICD-10-CM | POA: Diagnosis not present

## 2015-08-08 ENCOUNTER — Encounter (HOSPITAL_COMMUNITY): Payer: Self-pay

## 2015-08-09 ENCOUNTER — Encounter (HOSPITAL_COMMUNITY)
Admission: RE | Admit: 2015-08-09 | Discharge: 2015-08-09 | Disposition: A | Discharge status: Home / Self Care | Payer: Self-pay | Source: Ambulatory Visit | Attending: Cardiology | Admitting: Cardiology

## 2015-08-10 ENCOUNTER — Encounter (HOSPITAL_COMMUNITY): Payer: Self-pay

## 2015-08-11 ENCOUNTER — Encounter (HOSPITAL_COMMUNITY)
Admission: RE | Admit: 2015-08-11 | Discharge: 2015-08-11 | Disposition: A | Discharge status: Home / Self Care | Payer: Self-pay | Source: Ambulatory Visit | Attending: Cardiology | Admitting: Cardiology

## 2015-08-14 ENCOUNTER — Encounter (HOSPITAL_COMMUNITY)
Admission: RE | Admit: 2015-08-14 | Discharge: 2015-08-14 | Disposition: A | Discharge status: Home / Self Care | Payer: Self-pay | Source: Ambulatory Visit | Attending: Cardiology | Admitting: Cardiology

## 2015-08-15 ENCOUNTER — Encounter (HOSPITAL_COMMUNITY)
Admission: RE | Admit: 2015-08-15 | Discharge: 2015-08-15 | Disposition: A | Discharge status: Home / Self Care | Payer: Self-pay | Source: Ambulatory Visit | Attending: Cardiology | Admitting: Cardiology

## 2015-08-15 DIAGNOSIS — Z48812 Encounter for surgical aftercare following surgery on the circulatory system: Secondary | ICD-10-CM | POA: Insufficient documentation

## 2015-08-15 DIAGNOSIS — Z955 Presence of coronary angioplasty implant and graft: Secondary | ICD-10-CM | POA: Insufficient documentation

## 2015-08-17 ENCOUNTER — Encounter (HOSPITAL_COMMUNITY)
Admission: RE | Admit: 2015-08-17 | Discharge: 2015-08-17 | Disposition: A | Discharge status: Home / Self Care | Payer: Self-pay | Source: Ambulatory Visit | Attending: Cardiology | Admitting: Cardiology

## 2015-08-21 ENCOUNTER — Encounter (HOSPITAL_COMMUNITY): Payer: Self-pay

## 2015-08-22 ENCOUNTER — Encounter (HOSPITAL_COMMUNITY): Payer: Self-pay

## 2015-08-23 ENCOUNTER — Ambulatory Visit (INDEPENDENT_AMBULATORY_CARE_PROVIDER_SITE_OTHER): Payer: Managed Care, Other (non HMO)

## 2015-08-23 DIAGNOSIS — Z23 Encounter for immunization: Secondary | ICD-10-CM | POA: Diagnosis not present

## 2015-08-23 NOTE — Addendum Note (Signed)
Addended by: Lyman Bishop on: 08/23/2015 08:11 AM   Modules accepted: Orders

## 2015-08-24 ENCOUNTER — Encounter (HOSPITAL_COMMUNITY): Payer: Self-pay

## 2015-08-24 ENCOUNTER — Other Ambulatory Visit (INDEPENDENT_AMBULATORY_CARE_PROVIDER_SITE_OTHER): Payer: Managed Care, Other (non HMO)

## 2015-08-24 DIAGNOSIS — Z0000 Encounter for general adult medical examination without abnormal findings: Secondary | ICD-10-CM

## 2015-08-24 DIAGNOSIS — E039 Hypothyroidism, unspecified: Secondary | ICD-10-CM | POA: Diagnosis not present

## 2015-08-24 DIAGNOSIS — E119 Type 2 diabetes mellitus without complications: Secondary | ICD-10-CM

## 2015-08-24 LAB — BASIC METABOLIC PANEL
BUN: 15 mg/dL (ref 6–23)
CALCIUM: 9.3 mg/dL (ref 8.4–10.5)
CO2: 30 mEq/L (ref 19–32)
CREATININE: 1.06 mg/dL (ref 0.40–1.50)
Chloride: 104 mEq/L (ref 96–112)
GFR: 73.56 mL/min (ref >60.00)
GLUCOSE: 131 mg/dL — AB (ref 70–99)
Potassium: 4.5 mEq/L (ref 3.5–5.1)
Sodium: 141 mEq/L (ref 135–145)

## 2015-08-24 LAB — HEPATIC FUNCTION PANEL
ALBUMIN: 4 g/dL (ref 3.5–5.2)
ALT: 20 U/L (ref 0–53)
AST: 19 U/L (ref 0–37)
Alkaline Phosphatase: 88 U/L (ref 39–117)
BILIRUBIN TOTAL: 0.6 mg/dL (ref 0.2–1.2)
Bilirubin, Direct: 0.1 mg/dL (ref 0.0–0.3)
Total Protein: 7 g/dL (ref 6.0–8.3)

## 2015-08-24 LAB — LIPID PANEL
CHOL/HDL RATIO: 2
CHOLESTEROL: 94 mg/dL (ref 0–200)
HDL: 41.8 mg/dL (ref >39.00)
LDL CALC: 33 mg/dL (ref 0–99)
NonHDL: 52.32
TRIGLYCERIDES: 98 mg/dL (ref 0.0–149.0)
VLDL: 19.6 mg/dL (ref 0.0–40.0)

## 2015-08-24 LAB — TSH: TSH: 5.83 u[IU]/mL — AB (ref 0.35–4.50)

## 2015-08-24 LAB — HEMOGLOBIN A1C: Hgb A1c MFr Bld: 6.5 % (ref 4.6–6.5)

## 2015-08-25 LAB — HEPATITIS C ANTIBODY: HCV AB: NEGATIVE

## 2015-08-28 ENCOUNTER — Encounter (HOSPITAL_COMMUNITY): Payer: Self-pay

## 2015-08-29 ENCOUNTER — Encounter (HOSPITAL_COMMUNITY)
Admission: RE | Admit: 2015-08-29 | Discharge: 2015-08-29 | Disposition: A | Discharge status: Home / Self Care | Payer: Self-pay | Source: Ambulatory Visit | Attending: Cardiology | Admitting: Cardiology

## 2015-08-31 ENCOUNTER — Encounter (HOSPITAL_COMMUNITY)
Admission: RE | Admit: 2015-08-31 | Discharge: 2015-08-31 | Disposition: A | Discharge status: Home / Self Care | Payer: Self-pay | Source: Ambulatory Visit | Attending: Cardiology | Admitting: Cardiology

## 2015-09-01 ENCOUNTER — Encounter (HOSPITAL_COMMUNITY)
Admission: RE | Admit: 2015-09-01 | Discharge: 2015-09-01 | Disposition: A | Discharge status: Home / Self Care | Payer: Self-pay | Source: Ambulatory Visit | Attending: Cardiology | Admitting: Cardiology

## 2015-09-04 ENCOUNTER — Encounter (HOSPITAL_COMMUNITY)
Admission: RE | Admit: 2015-09-04 | Discharge: 2015-09-04 | Disposition: A | Discharge status: Home / Self Care | Payer: Self-pay | Source: Ambulatory Visit | Attending: Cardiology | Admitting: Cardiology

## 2015-09-05 ENCOUNTER — Encounter (HOSPITAL_COMMUNITY): Payer: Self-pay

## 2015-09-06 ENCOUNTER — Encounter (HOSPITAL_COMMUNITY)
Admission: RE | Admit: 2015-09-06 | Discharge: 2015-09-06 | Disposition: A | Discharge status: Home / Self Care | Payer: Self-pay | Source: Ambulatory Visit | Attending: Cardiology | Admitting: Cardiology

## 2015-09-11 ENCOUNTER — Encounter (HOSPITAL_COMMUNITY): Payer: Self-pay

## 2015-09-12 ENCOUNTER — Encounter (HOSPITAL_COMMUNITY)
Admission: RE | Admit: 2015-09-12 | Discharge: 2015-09-12 | Disposition: A | Discharge status: Home / Self Care | Payer: Self-pay | Source: Ambulatory Visit | Attending: Cardiology | Admitting: Cardiology

## 2015-09-14 ENCOUNTER — Encounter (HOSPITAL_COMMUNITY)
Admission: RE | Admit: 2015-09-14 | Discharge: 2015-09-14 | Disposition: A | Discharge status: Home / Self Care | Payer: Self-pay | Source: Ambulatory Visit | Attending: Cardiology | Admitting: Cardiology

## 2015-09-14 DIAGNOSIS — Z955 Presence of coronary angioplasty implant and graft: Secondary | ICD-10-CM | POA: Insufficient documentation

## 2015-09-14 DIAGNOSIS — Z48812 Encounter for surgical aftercare following surgery on the circulatory system: Secondary | ICD-10-CM | POA: Insufficient documentation

## 2015-09-18 ENCOUNTER — Encounter (HOSPITAL_COMMUNITY): Payer: Self-pay

## 2015-09-19 ENCOUNTER — Encounter (HOSPITAL_COMMUNITY): Payer: Self-pay

## 2015-09-21 ENCOUNTER — Encounter (HOSPITAL_COMMUNITY): Payer: Self-pay

## 2015-09-25 ENCOUNTER — Encounter (HOSPITAL_COMMUNITY): Payer: Self-pay

## 2015-09-26 ENCOUNTER — Encounter (HOSPITAL_COMMUNITY): Payer: Self-pay

## 2015-09-28 ENCOUNTER — Encounter (HOSPITAL_COMMUNITY): Payer: Self-pay

## 2015-09-28 ENCOUNTER — Ambulatory Visit: Payer: Managed Care, Other (non HMO) | Admitting: Internal Medicine

## 2015-10-02 ENCOUNTER — Encounter (HOSPITAL_COMMUNITY)
Admission: RE | Admit: 2015-10-02 | Discharge: 2015-10-02 | Disposition: A | Discharge status: Home / Self Care | Payer: Self-pay | Source: Ambulatory Visit | Attending: Cardiology | Admitting: Cardiology

## 2015-10-03 ENCOUNTER — Encounter (HOSPITAL_COMMUNITY): Payer: Self-pay

## 2015-10-04 ENCOUNTER — Encounter (HOSPITAL_COMMUNITY)
Admission: RE | Admit: 2015-10-04 | Discharge: 2015-10-04 | Disposition: A | Discharge status: Home / Self Care | Payer: Self-pay | Source: Ambulatory Visit | Attending: Cardiology | Admitting: Cardiology

## 2015-10-05 ENCOUNTER — Encounter (HOSPITAL_COMMUNITY): Payer: Self-pay

## 2015-10-10 ENCOUNTER — Encounter (HOSPITAL_COMMUNITY): Payer: Self-pay

## 2015-10-12 ENCOUNTER — Encounter (HOSPITAL_COMMUNITY): Payer: Self-pay

## 2015-10-17 ENCOUNTER — Encounter (HOSPITAL_COMMUNITY)
Admission: RE | Admit: 2015-10-17 | Discharge: 2015-10-17 | Disposition: A | Discharge status: Home / Self Care | Payer: Self-pay | Source: Ambulatory Visit | Attending: Cardiology | Admitting: Cardiology

## 2015-10-17 DIAGNOSIS — Z48812 Encounter for surgical aftercare following surgery on the circulatory system: Secondary | ICD-10-CM | POA: Insufficient documentation

## 2015-10-17 DIAGNOSIS — Z955 Presence of coronary angioplasty implant and graft: Secondary | ICD-10-CM | POA: Insufficient documentation

## 2015-10-18 ENCOUNTER — Encounter: Payer: Self-pay | Admitting: Internal Medicine

## 2015-10-18 ENCOUNTER — Ambulatory Visit (INDEPENDENT_AMBULATORY_CARE_PROVIDER_SITE_OTHER): Payer: Medicare Other | Admitting: Internal Medicine

## 2015-10-18 VITALS — BP 116/72 | HR 60 | Temp 98.8°F | Ht 73.0 in | Wt 248.0 lb

## 2015-10-18 DIAGNOSIS — E039 Hypothyroidism, unspecified: Secondary | ICD-10-CM

## 2015-10-18 DIAGNOSIS — W19XXXA Unspecified fall, initial encounter: Secondary | ICD-10-CM

## 2015-10-18 DIAGNOSIS — Y92009 Unspecified place in unspecified non-institutional (private) residence as the place of occurrence of the external cause: Secondary | ICD-10-CM | POA: Insufficient documentation

## 2015-10-18 DIAGNOSIS — Z0000 Encounter for general adult medical examination without abnormal findings: Secondary | ICD-10-CM

## 2015-10-18 DIAGNOSIS — T148 Other injury of unspecified body region: Secondary | ICD-10-CM | POA: Diagnosis not present

## 2015-10-18 DIAGNOSIS — S51012A Laceration without foreign body of left elbow, initial encounter: Secondary | ICD-10-CM

## 2015-10-18 DIAGNOSIS — M25532 Pain in left wrist: Secondary | ICD-10-CM

## 2015-10-18 DIAGNOSIS — T07XXXA Unspecified multiple injuries, initial encounter: Secondary | ICD-10-CM | POA: Insufficient documentation

## 2015-10-18 DIAGNOSIS — I10 Essential (primary) hypertension: Secondary | ICD-10-CM | POA: Diagnosis not present

## 2015-10-18 DIAGNOSIS — S51019A Laceration without foreign body of unspecified elbow, initial encounter: Secondary | ICD-10-CM | POA: Insufficient documentation

## 2015-10-18 DIAGNOSIS — Z0189 Encounter for other specified special examinations: Secondary | ICD-10-CM

## 2015-10-18 DIAGNOSIS — E119 Type 2 diabetes mellitus without complications: Secondary | ICD-10-CM

## 2015-10-18 DIAGNOSIS — Y92099 Unspecified place in other non-institutional residence as the place of occurrence of the external cause: Secondary | ICD-10-CM

## 2015-10-18 MED ORDER — LEVOTHYROXINE SODIUM 137 MCG PO CAPS: ORAL_CAPSULE | ORAL | Status: DC

## 2015-10-18 NOTE — Patient Instructions (Signed)
OK to use the neosporin and gauze covering to left elbow until healed  OK to increase the thyroid medication to 137 mcg per day  Please continue all other medications as before, including the aspirin  Please have the pharmacy call with any other refills you may need.  Please continue your efforts at being more active, low cholesterol diet, and weight control.  Please keep your appointments with your specialists as you may have planned including the Neurology on Jan 11  Please go to the XRAY Department in the Basement (go straight as you get off the elevator) for the x-ray testing  You will be contacted by phone if any changes need to be made immediately.  Otherwise, you will receive a letter about your results with an explanation, but please check with MyChart first.  Please remember to sign up for MyChart if you have not done so, as this will be important to you in the future with finding out test results, communicating by private email, and scheduling acute appointments online when needed.  Please return in 6 months, or sooner if needed, with Lab testing done 3-5 days before

## 2015-10-18 NOTE — Progress Notes (Signed)
Subjective:    Patient ID: Allen Myers, male    DOB: 10/23/1945, 70 y.o.   MRN: [id number]  HPI  Here to f/u; overall doing ok,  Pt denies chest pain, increasing sob or doe, wheezing, orthopnea, PND, increased LE swelling, palpitations, or syncope.  Pt denies new neurological symptoms such as new headache, or facial or extremity weakness or numbness.  Pt denies polydipsia, polyuria, or low sugar episode.   Pt denies new neurological symptoms such as new headache, or facial or extremity weakness or numbness.   Pt states overall good compliance with meds, mostly trying to follow appropriate diet, with wt overall stable,  but little exercise however Wt Readings from Last 3 Encounters:  10/18/15 248 lb (112.492 kg)  08/01/15 249 lb (112.946 kg)  07/13/15 246 lb 12.8 oz (111.948 kg)  Was playing basketball with just shooting the ball from a standing position and somewhat lost his balance it seems, couldn't stop or control himself, ended up on the concrete with abrations to left later upper let, left elbow and also with new swelling/pain to left wrist and hand.  Took several minutes to get up due to weakness. No HA or head injury. No neck pain or back pain but sore all over. Was wtinessed by a young family member but no shaking or siezure.  Did have 3 other falls in the past year due to loss of balance, but no injuries there.  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  Still driving, can see blind spots without dizziness with turning head.  No new medications, taking dz med - keppra. Blood sugar just the fall after 135.  Has neurologist appt jan 11.   Past Medical History  Diagnosis Date  . Hyperlipidemia   . History of colon polyps   . History of diverticulitis   . Erectile dysfunction   . HYPERTENSION   . SDH (subdural hematoma) ([facility])     Following syncopal episode  . SAH (subarachnoid hemorrhage) ([place])     Following syncopal episode  . Kidney stones     "passed it"  . Sleep apnea in adult    . Hypothyroidism   . Depression   . Type II diabetes mellitus ([place])   . Seizures ([place])     just once a few weeks ago" (05/11/2014)   Past Surgical History  Procedure Laterality Date  . Colonoscopy    . Polypectomy    . Coronary angioplasty with stent placement  05/11/2014    "1"  . Craniotomy Left 09/12/2014    Procedure: CRANIOTOMY HEMATOMA EVACUATION SUBDURAL;  Surgeon: Erline Levine, MD;  Location: [place] [facility];  Service: Neurosurgery;  Laterality: Left;  . Left heart catheterization with coronary angiogram N/A 05/11/2014    Procedure: LEFT HEART CATHETERIZATION WITH CORONARY ANGIOGRAM;  Surgeon: [name], MD;  Location: [facility] CATH LAB;  Service: Cardiovascular;  Laterality: N/A;  . Fractional flow reserve wire  05/11/2014    Procedure: FRACTIONAL FLOW RESERVE WIRE;  Surgeon: [name], MD;  Location: [facility] CATH LAB;  Service: Cardiovascular;;    reports that he quit smoking about 32 years ago. His smoking use included Cigarettes. He has a 30 pack-year smoking history. He has never used smokeless tobacco. He reports that he does not drink alcohol or use illicit drugs. family history includes Cancer in his brother and cousin; Heart disease in his father and mother; Lung cancer in his father. There is no history of Colon cancer,  Rectal cancer, or Stomach cancer. No Known Allergies Current Outpatient Prescriptions on File Prior to Visit  Medication Sig Dispense Refill  . aspirin EC 81 MG tablet Take 81 mg by mouth daily.    Marland Kitchen atorvastatin (LIPITOR) 80 MG tablet TAKE 1 TABLET BY MOUTH DAILY 90 tablet 1  . carvedilol (COREG) 3.125 MG tablet TAKE 1 TABLET BY MOUTH TWO TIMES DAILY WITH A MEAL 180 tablet 2  . levETIRAcetam (KEPPRA) 1000 MG tablet Take 1 tablet (1,000 mg total) by mouth 2 (two) times daily. 60 tablet 1  . lisinopril (PRINIVIL,ZESTRIL) 2.5 MG tablet TAKE 1 TABLET BY MOUTH DAILY 90 tablet 1  . metFORMIN (GLUCOPHAGE) 1000 MG tablet TAKE 1 TABLET BY MOUTH TWICE DAILY WITH  A MEAL 180 tablet 2  . nitroGLYCERIN (NITROSTAT) 0.4 MG SL tablet Place 1 tablet (0.4 mg total) under the tongue every 5 (five) minutes as needed for chest pain. 25 tablet 12  . ONE TOUCH ULTRA TEST test strip USE TO CHECK BLOOD SUGAR TWICE A DAY AS DIRECTED 100 each 3  . pioglitazone (ACTOS) 30 MG tablet Take 1 tablet (30 mg total) by mouth daily. 90 tablet 3  . sitaGLIPtin (JANUVIA) 100 MG tablet Take 1 tablet (100 mg total) by mouth daily. 90 tablet 3   No current facility-administered medications on file prior to visit.   Review of Systems  Constitutional: Negative for unusual diaphoresis or night sweats HENT: Negative for ringing in ear or discharge Eyes: Negative for double vision or worsening visual disturbance.  Respiratory: Negative for choking and stridor.   Gastrointestinal: Negative for vomiting or other signifcant bowel change Genitourinary: Negative for hematuria or change in urine volume.  Musculoskeletal: Negative for other MSK pain or swelling Skin: Negative for color change and worsening wound.  Neurological: Negative for tremors and numbness other than noted  Psychiatric/Behavioral: Negative for decreased concentration or agitation other than above       Objective:   Physical Exam BP 116/72 mmHg  Pulse 60  Temp(Src) 98.8 F (37.1 C) (Oral)  Ht 6\' 1"  (1.854 m)  Wt 248 lb (112.492 kg)  BMI 32.73 kg/m2  SpO2 95% VS noted,  Constitutional: Pt appears in no significant distress HENT: Head: NCAT.  Right Ear: External ear normal.  Left Ear: External ear normal.  Eyes: . Pupils are equal, round, and reactive to light. Conjunctivae and EOM are normal Neck: Normal range of motion. Neck supple.  Cardiovascular: Normal rate and regular rhythm.   Pulmonary/Chest: Effort normal and breath sounds without rales or wheezing.  Abd:  Soft, NT, ND, + BS Neurological: Pt is alert. Not confused , motor 5/5 intact Skin: Skin is warm. No rash except small skin tear left elbow  with abrasion, abrasion to left lateral upper leg above the knee and tender mild over the greater trochanter,  no LE edema Left wrist with 1-2+ edema/tender/pain to the area of thumb and wrist mostly at the first dorsal compartment region, decreased ROM to wrist, small bruising noted as well, o/w neurovasc intact Psychiatric: Pt behavior is normal. No agitation.     Assessment & Plan:

## 2015-10-18 NOTE — Progress Notes (Signed)
Pre visit review using our clinic review tool, if applicable. No additional management support is needed unless otherwise documented below in the visit note. 

## 2015-10-19 ENCOUNTER — Encounter (HOSPITAL_COMMUNITY): Payer: Self-pay

## 2015-10-21 NOTE — Assessment & Plan Note (Signed)
S/p fall, for wrist xray, pain control,  to f/u any worsening symptoms or concerns

## 2015-10-21 NOTE — Assessment & Plan Note (Signed)
stable overall by history and exam, recent data reviewed with pt, and pt to continue medical treatment as before,  to f/u any worsening symptoms or concerns Lab Results  Component Value Date   HGBA1C 6.5 08/24/2015

## 2015-10-21 NOTE — Assessment & Plan Note (Addendum)
stable overall by history and exam, recent data reviewed with pt, and pt to continue medical treatment as before except to incr the levothyroxine to 127 mcg,  to f/u any worsening symptoms or concerns Lab Results  Component Value Date   TSH 5.83* 08/24/2015

## 2015-10-21 NOTE — Assessment & Plan Note (Signed)
Suspect related to neuro residual from previous TBI, will hold on imaging at this time,  to f/u any worsening symptoms or concerns, as f/u with neuro as planned  Note:  Total time for pt hx, exam, review of record with pt in the room, determination of diagnoses and plan for further eval and tx is > 40 min, with over 50% spent in coordination and counseling of patient

## 2015-10-21 NOTE — Assessment & Plan Note (Signed)
[place] for tylenol prn,  to f/u any worsening symptoms or concerns

## 2015-10-21 NOTE — Assessment & Plan Note (Signed)
stable overall by history and exam, recent data reviewed with pt, and pt to continue medical treatment as before,  to f/u any worsening symptoms or concerns BP Readings from Last 3 Encounters:  10/18/15 116/72  08/01/15 112/64  07/13/15 96/50

## 2015-10-21 NOTE — Assessment & Plan Note (Signed)
[place] for neosporin/gauze,  to f/u any worsening symptoms or concerns

## 2015-10-23 ENCOUNTER — Encounter (HOSPITAL_COMMUNITY)
Admission: RE | Admit: 2015-10-23 | Discharge: 2015-10-23 | Disposition: A | Discharge status: Home / Self Care | Payer: Self-pay | Source: Ambulatory Visit | Attending: Cardiology | Admitting: Cardiology

## 2015-10-24 ENCOUNTER — Encounter (HOSPITAL_COMMUNITY)
Admission: RE | Admit: 2015-10-24 | Discharge: 2015-10-24 | Disposition: A | Discharge status: Home / Self Care | Payer: Self-pay | Source: Ambulatory Visit | Attending: Cardiology | Admitting: Cardiology

## 2015-10-24 ENCOUNTER — Encounter: Payer: Self-pay | Admitting: Neurology

## 2015-10-24 ENCOUNTER — Ambulatory Visit (INDEPENDENT_AMBULATORY_CARE_PROVIDER_SITE_OTHER): Payer: Medicare Other | Admitting: Neurology

## 2015-10-24 VITALS — BP 119/67 | HR 61 | Ht 73.0 in | Wt 251.0 lb

## 2015-10-24 DIAGNOSIS — S065XAA Traumatic subdural hemorrhage with loss of consciousness status unknown, initial encounter: Secondary | ICD-10-CM

## 2015-10-24 DIAGNOSIS — I10 Essential (primary) hypertension: Secondary | ICD-10-CM | POA: Diagnosis not present

## 2015-10-24 DIAGNOSIS — R569 Unspecified convulsions: Secondary | ICD-10-CM | POA: Diagnosis not present

## 2015-10-24 DIAGNOSIS — Y92009 Unspecified place in unspecified non-institutional (private) residence as the place of occurrence of the external cause: Principal | ICD-10-CM

## 2015-10-24 DIAGNOSIS — I6200 Nontraumatic subdural hemorrhage, unspecified: Secondary | ICD-10-CM | POA: Diagnosis not present

## 2015-10-24 DIAGNOSIS — E785 Hyperlipidemia, unspecified: Secondary | ICD-10-CM

## 2015-10-24 DIAGNOSIS — S065X9A Traumatic subdural hemorrhage with loss of consciousness of unspecified duration, initial encounter: Secondary | ICD-10-CM

## 2015-10-24 DIAGNOSIS — W19XXXA Unspecified fall, initial encounter: Secondary | ICD-10-CM

## 2015-10-24 NOTE — Progress Notes (Signed)
STROKE NEUROLOGY FOLLOW UP NOTE  NAME: Allen Myers DOB: 12-Dec-1945  REASON FOR VISIT: stroke follow up HISTORY FROM: chart and pt  Today we had the pleasure of seeing Allen Myers in follow-up at our Neurology Clinic. Pt was accompanied by wife.   History Summary 70 year Caucasian male who had episode of brief loss of consciousness on 04/01/14. He was raking some leaves on the roof of his house when he states that he felt dizzy and lost balance and fell down and hit his head. He must have lost consciousness for a while. His wife called his neighbor who climbed on the roof and noticed that the patient was all pale and bluish and had some jerking. He was found to have multiple rib fractures and L1 transverse process fracture. CT scan of the head showed a small right convexity subdural hematoma and superficial left temporoparietal occipital subarachnoid hemorrhage. He was treated conservatively and remained stable. He subsequently had a witnessed generalized tonic-clonic seizure the next after admission and required temporary intubation for her for protection. He was started on Keppra and had no further seizures during hospitalization. Followup CT scan of the brain showed stable appearance. Followup CT scan done on 04/28/14 showed complete resolution of the tiny subdural and somewhat subarachnoid bleeds. He states is done well and has had no further seizures. His headaches have resolved and he has not taken any headache medications for more than 2-3 weeks. He has no focal neurological symptoms or deficits. He does however complain of orthostatic dizziness and mild orthostasis symptoms when he stands up. He denies gait imbalance, double vision or vertigo. He has not had any prior history of strokes or TIAs. He seems to be tolerating Keppra 500 twice daily well without major side effects. He had cardiac catheterization done by Dr. Daneen Myers and had a stent placed. He is currently on aspirin 81 mg.    Follow up 07/28/14 (PS) - He has not had any recurrent seizures. He has been tolerating Keppra quite well without any side effects. He states his orthostatic dizziness symptoms are much improved and probably intermittently. He does do orthostatic problems exercises. He underwent followup MRI scan of the brain on 05/28/14 shows only a thin rim of resolving subdural hematoma over the right convexity. MRA of the brain shows no large vessel stenosis, occlusion or aneurysms . EEG done on 05/17/14 was normal without epileptiform activity noted. Keppra was continued.  Follow up 09/26/14 - the patient had a fall mid Nov. One week after the fall, he was admitted to [facility]14 due to speech difficulties and right cheek numbness. It comes and goes and episodic. Pt was brought to ED and CT showed left hemispheric SDH. At the beginning it not very frequent and lasting 27min but as time goes by, the episodes more frequent and lasting longer up to 3min. His ASA and brilinta were discontinued and his keppar was increased to 1000mg  bid. Repeat CT showed slight enlargement of SDH, and pt continued with aphasia and right arm weakness episodes. Therefore, left SDH evacuation was done on 09/12/14 by NSG. Pt condition getting better and symptoms resolved. He was discharged on 09/16/14 with resumption of baby ASA.  He had cardiac stent in July and since then he was on ASA and brilinta. Due to the SDH, he is currently not on brilinta. A early referral was requested for management of antiplatelet. He is on ASA 81mg  now.  Follow up 11/23/14 - he was doing  well. No seizures and no neurological changes. He is on cardio rehab and he feels great. He had CT repeat showed evolving left SDH but not completely resolved. He followed with Dr. Stanford Myers in cardiology and OK with just on ASA 81mg  for CAD. He also saw NSG for follow up and was told in good shape. He continued on baby ASA and lipitor and keppra. He admit that he did drive a couple  of time short distance to grocery store. He was again instructed not to drive until seizure free 6 months. His BP 96/62 in clinic today but he stated that is his normal BP at home. He stated that his glucose controlled well at home, glucose 100-120s.  Follow up 04/24/15 - he has been doing well and no recurrent seizures. He has resumed driving and continued on keppra and ASA. No acute issues. He is wondering whether he is able to taper off keppra. Told him for taper off keppra, he also needs to refrain from driving for 6 months after taper to monitor seizure. He then prefer to be on the medication to allow him continued driving. BP 95/61 today.  Interval History During the interval time, he was doing well except still falling at home. He had one fall one week ago at home playing basketball with his grandson. He stated that he just lost his balance and fall and not able to get up. He denies any head injury but he did have bruise at left arm due to the fall. He felt his balancing and coordination were not as good as before. He sometimes has slurry speech when he was tired and fatigue. He continues to have cardio rehab and his HTN, HLD and DM are in good control. Checked two months ago with LDL and A1C were in good numbers. No seizure during the interval time. Still on keppra and still driving. BP today 119/67.    REVIEW OF SYSTEMS: Full 14 system review of systems performed and notable only for those listed below and in HPI above, all others are negative:  Constitutional: N/A  Cardiovascular: N/A  Ear/Nose/Throat:  Skin: itching  Eyes: N/A  Respiratory: N/A  Gastroitestinal: N/A  Genitourinary: N/A Hematology/Lymphatic: bruise easily  Endocrine: N/A  Musculoskeletal: Neck pain, back pain, neck stiffness  Allergy/Immunology: N/A  Neurological: poor balance  Psychiatric: Sleep: daytime sleepiness  The following represents the patient's updated allergies and side effects list: No Known  Allergies  The neurologically relevant items on the patient's problem list were reviewed on today's visit.  Neurologic Examination  A problem focused neurological exam (12 or more points of the single system neurologic examination, vital signs counts as 1 point, cranial nerves count for 8 points) was performed.  Blood pressure 119/67, pulse 61, height 6\' 1"  (1.854 m), weight 251 lb (113.853 kg).  General - Well nourished, well developed, in no apparent distress.  Ophthalmologic - not able to see through.  Cardiovascular - Regular rate and rhythm with no murmur.  Mental Status -  Level of arousal and orientation to time, place, and person were intact. Language including expression, naming, repetition, comprehension was assessed and found intact.  Cranial Nerves II - XII - II - Visual field intact OU. III, IV, VI - Extraocular movements intact. V - Facial sensation intact bilaterally. VII - Facial movement intact bilaterally. VIII - Hearing & vestibular intact bilaterally. X - Palate elevates symmetrically. XI - Chin turning & shoulder shrug intact bilaterally. XII - Tongue protrusion intact.  Motor Strength -  The patient's strength was normal in all extremities and pronator drift was absent.  Bulk was normal and fasciculations were absent.   Motor Tone - Muscle tone was assessed at the neck and appendages and was normal.  Reflexes - The patient's reflexes were normal in all extremities and he had no pathological reflexes.  Sensory - Light touch, temperature/pinprick, vibration and proprioception, and Romberg testing were assessed and were normal.    Coordination - The patient had normal movements in the hands and feet with no ataxia or dysmetria.  Tremor was absent.  Gait and Station - The patient's transfers, posture, gait, station, and turns were observed as normal.  Data reviewed: I personally reviewed the images and agree with the radiology interpretations.  Ct Head Wo  Contrast  09/30/14 - Abnormal CT head (without) demonstrating: 1. Improving bifrontal and left anterior temporal subdural hematomas, with overlying left frontal craniotomy. This measures maximum of 7-70mm in thickness over the left frontal regions.  2. Compared to prior CT on 09/11/14, there has been interval left frontal craniotomy and drainage of subdural hematoma, with slight interval reduction in size of subdural hematomas with expected evolutional changes.  09/11/2014  IMPRESSION: Bilateral subdural hematomas are stable in size. Continued liquefaction of left-sided subdural hematoma. 6 mm midline shift to the right is stable.  09/10/2014   IMPRESSION: Left-sided subdural hematoma slightly larger. No definite repeat hemorrhage is identified. 1 mm more of midline shift to the right compared with the prior study.  Small right frontal subdural hematoma unchanged. Interhemispheric subdural hematoma slightly improved.      09/07/2014   IMPRESSION: Bilateral subdural hematomas unchanged. Mild midline shift to the right unchanged. No new hemorrhage.     09/06/2014   IMPRESSION: 1. Bilateral and parafalcine acute subdural hematomas are identified. 2. Small vessel ischemic change and brain atrophy.    Mri and Mra Head Wo Contrast  09/09/2014   IMPRESSION: Subacute subdural hematomas bilaterally are stable, measuring up to 11 mm in thickness on the left. No new hemorrhage.  No acute infarct  Negative MRA head.    2D echo 07/19/14 - - Normal biventricular size and function. Moderate concentric LVH with abnormal relaxation and normal filling pressures. Mild tricuspid regurgitation. Normal RVSP.  EEG 09/07/14 - Normal electroencephalogram, awake, asleep and with activation  procedures. There are no focal lateralizing or epileptiform features.  Component     Latest Ref Rng 09/30/2014 03/21/2015 08/24/2015  Cholesterol     0 - 200 mg/dL 89 91 94  Triglycerides     0.0 - 149.0 mg/dL 51.0 51.0 98.0   HDL Cholesterol     >39.00 mg/dL 32.80 (L) 41.30 41.80  VLDL     0.0 - 40.0 mg/dL 10.2 10.2 19.6  LDL (calc)     0 - 99 mg/dL 46 40 33  Total CHOL/HDL Ratio      3 2 2   NonHDL      56.20 49.70 52.32  Hemoglobin A1C     4.6 - 6.5 % 6.5 6.4 6.5  TSH     0.35 - 4.50 uIU/mL  5.61 (H) 5.83 (H)    Assessment: As you may recall, he is a 70 y.o. Caucasian male with PMH of CAD s/p stent in 04/2014 on ASA and brilinta, right SDH s/p fall with seizure in 03/2014 put on keppra was admitted again in 08/2014 for left SDH s/p fall. Had slight enlargement of left SDH over days and frequent focal seizure with aphasia and right  cheek numbness. Increased keppra dose to 1000mg  bid. Had SDH evacuation on 09/12/14. Discharged with ASA 81mg  but brilinta stopped. He was clinically doing well after discharge and followed up in clinic in 09/2014, repeat CT showed evolving left SDH. Followed up with cardiology and OK with only on baby ASA. No seizures after discharge. During the interval time, he has been doing OK, no recurrent seizure and resumed driving. However, he complains of poor balance and occasional fall at home.  Plan:  - continue ASA 81mg  and lipitor for stroke and cardiac prevention. - continue keppra 1000mg  bid. - OK to drive with seizure medication and under physician's care.  - Maintain seizure precautions.  - Follow up with your primary care physician for stroke risk factor modification. Recommend maintain blood pressure goal around 130/80, diabetes with hemoglobin A1c goal below 6.5% and lipids with LDL cholesterol goal below 70 mg/dL.  - check BP and glucose at home - outpt physical therapy referral for balance and gait - be cautious about gait and balance, avoid fall, no over exertion.  - follow up in 6 months.  I spent more than 25 minutes of face to face time with the patient. Greater than 50% of time was spent in counseling and coordination of care. We have discussed about seizure  precautions, fall avoidance and no overexertion.    Orders Placed This Encounter  Procedures  . Ambulatory referral to Physical Therapy    Referral Priority:  Routine    Referral Type:  Physical Medicine    Referral Reason:  Specialty Services Required    Requested Specialty:  Physical Therapy    Number of Visits Requested:  1    No orders of the defined types were placed in this encounter.    Patient Instructions  - continue ASA 81mg  and lipitor for stroke and cardiac prevention. - continue keppra 1000mg  bid. - OK to drive with seizure medication and under physician's care.  - Maintain seizure precautions.  - Follow up with your primary care physician for stroke risk factor modification. Recommend maintain blood pressure goal around 130/80, diabetes with hemoglobin A1c goal below 6.5% and lipids with LDL cholesterol goal below 70 mg/dL.  - check BP and glucose at home - outpt physical therapy referral for balance and gait - be cautious about gait and balance, avoid fall, no over exertion.  - follow up in 6 months.    Rosalin Hawking, MD PhD [facility] [address], [place], [place] [address] [phone]

## 2015-10-24 NOTE — Patient Instructions (Addendum)
-   continue ASA 81mg  and lipitor for stroke and cardiac prevention. - continue keppra 1000mg  bid. - OK to drive with seizure medication and under physician's care.  - Maintain seizure precautions.  - Follow up with your primary care physician for stroke risk factor modification. Recommend maintain blood pressure goal around 130/80, diabetes with hemoglobin A1c goal below 6.5% and lipids with LDL cholesterol goal below 70 mg/dL.  - check BP and glucose at home - outpt physical therapy referral for balance and gait - be cautious about gait and balance, avoid fall, no over exertion.  - follow up in 6 months.

## 2015-10-25 ENCOUNTER — Ambulatory Visit: Payer: Managed Care, Other (non HMO) | Admitting: Neurology

## 2015-10-25 ENCOUNTER — Encounter (HOSPITAL_COMMUNITY)
Admission: RE | Admit: 2015-10-25 | Discharge: 2015-10-25 | Disposition: A | Discharge status: Home / Self Care | Payer: Self-pay | Source: Ambulatory Visit | Attending: Cardiology | Admitting: Cardiology

## 2015-10-26 ENCOUNTER — Encounter (HOSPITAL_COMMUNITY)
Admission: RE | Admit: 2015-10-26 | Discharge: 2015-10-26 | Disposition: A | Discharge status: Home / Self Care | Payer: Self-pay | Source: Ambulatory Visit | Attending: Cardiology | Admitting: Cardiology

## 2015-10-27 ENCOUNTER — Encounter (HOSPITAL_COMMUNITY)
Admission: RE | Admit: 2015-10-27 | Discharge: 2015-10-27 | Disposition: A | Discharge status: Home / Self Care | Payer: Self-pay | Source: Ambulatory Visit | Attending: Cardiology | Admitting: Cardiology

## 2015-10-30 ENCOUNTER — Encounter (HOSPITAL_COMMUNITY): Payer: Self-pay

## 2015-10-31 ENCOUNTER — Encounter (HOSPITAL_COMMUNITY): Payer: Self-pay

## 2015-11-02 ENCOUNTER — Encounter (HOSPITAL_COMMUNITY)
Admission: RE | Admit: 2015-11-02 | Discharge: 2015-11-02 | Disposition: A | Discharge status: Home / Self Care | Payer: Self-pay | Source: Ambulatory Visit | Attending: Cardiology | Admitting: Cardiology

## 2015-11-06 ENCOUNTER — Encounter (HOSPITAL_COMMUNITY)
Admission: RE | Admit: 2015-11-06 | Discharge: 2015-11-06 | Disposition: A | Discharge status: Home / Self Care | Payer: Self-pay | Source: Ambulatory Visit | Attending: Cardiology | Admitting: Cardiology

## 2015-11-07 ENCOUNTER — Encounter (HOSPITAL_COMMUNITY)
Admission: RE | Admit: 2015-11-07 | Discharge: 2015-11-07 | Disposition: A | Discharge status: Home / Self Care | Payer: Self-pay | Source: Ambulatory Visit | Attending: Cardiology | Admitting: Cardiology

## 2015-11-09 ENCOUNTER — Encounter (HOSPITAL_COMMUNITY)
Admission: RE | Admit: 2015-11-09 | Discharge: 2015-11-09 | Disposition: A | Discharge status: Home / Self Care | Payer: Self-pay | Source: Ambulatory Visit | Attending: Cardiology | Admitting: Cardiology

## 2015-11-10 ENCOUNTER — Encounter (HOSPITAL_COMMUNITY)
Admission: RE | Admit: 2015-11-10 | Discharge: 2015-11-10 | Disposition: A | Discharge status: Home / Self Care | Payer: Self-pay | Source: Ambulatory Visit | Attending: Cardiology | Admitting: Cardiology

## 2015-11-13 ENCOUNTER — Encounter (HOSPITAL_COMMUNITY): Payer: Self-pay

## 2015-11-14 ENCOUNTER — Encounter (HOSPITAL_COMMUNITY)
Admission: RE | Admit: 2015-11-14 | Discharge: 2015-11-14 | Disposition: A | Discharge status: Home / Self Care | Payer: Self-pay | Source: Ambulatory Visit | Attending: Cardiology | Admitting: Cardiology

## 2015-11-15 ENCOUNTER — Encounter (HOSPITAL_COMMUNITY)
Admission: RE | Admit: 2015-11-15 | Discharge: 2015-11-15 | Disposition: A | Discharge status: Home / Self Care | Payer: Self-pay | Source: Ambulatory Visit | Attending: Cardiology | Admitting: Cardiology

## 2015-11-15 ENCOUNTER — Ambulatory Visit: Payer: Medicare Other | Attending: Neurology | Admitting: Physical Therapy

## 2015-11-15 DIAGNOSIS — I420 Dilated cardiomyopathy: Secondary | ICD-10-CM | POA: Insufficient documentation

## 2015-11-15 DIAGNOSIS — R269 Unspecified abnormalities of gait and mobility: Secondary | ICD-10-CM

## 2015-11-15 DIAGNOSIS — R293 Abnormal posture: Secondary | ICD-10-CM | POA: Diagnosis not present

## 2015-11-15 DIAGNOSIS — I2510 Atherosclerotic heart disease of native coronary artery without angina pectoris: Secondary | ICD-10-CM | POA: Insufficient documentation

## 2015-11-16 ENCOUNTER — Encounter (HOSPITAL_COMMUNITY)
Admission: RE | Admit: 2015-11-16 | Discharge: 2015-11-16 | Disposition: A | Discharge status: Home / Self Care | Payer: Self-pay | Source: Ambulatory Visit | Attending: Cardiology | Admitting: Cardiology

## 2015-11-16 NOTE — Therapy (Signed)
[facility] [address] [place], [place], [address] Phone: [phone]   Fax:  [phone]  Physical Therapy Evaluation  Patient Details  Name: Allen Myers MRN: [id number] Date of Birth: 22-Mar-1946 Referring Provider: Rosalin Hawking  Encounter Date: 11/15/2015      PT End of Session - 11/16/15 1303    Visit Number 1   Number of Visits 9   Date for PT Re-Evaluation 01/14/16   Authorization Type Medicare-Gcode every 10th visit   PT Start Time 0806   PT Stop Time 0854   PT Time Calculation (min) 48 min   Activity Tolerance Patient tolerated treatment well   Behavior During Therapy [facility] for tasks assessed/performed      Past Medical History  Diagnosis Date  . Hyperlipidemia   . History of colon polyps   . History of diverticulitis   . Erectile dysfunction   . HYPERTENSION   . SDH (subdural hematoma) (HCC)     Following syncopal episode  . SAH (subarachnoid hemorrhage) ([place])     Following syncopal episode  . Kidney stones     "passed it"  . Sleep apnea in adult   . Hypothyroidism   . Depression   . Type II diabetes mellitus ([place])   . Seizures ([place])     just once a few weeks ago" (05/11/2014)    Past Surgical History  Procedure Laterality Date  . Colonoscopy    . Polypectomy    . Coronary angioplasty with stent placement  05/11/2014    "1"  . Craniotomy Left 09/12/2014    Procedure: CRANIOTOMY HEMATOMA EVACUATION SUBDURAL;  Surgeon: Erline Levine, MD;  Location: [facility] NEURO ORS;  Service: Neurosurgery;  Laterality: Left;  . Left heart catheterization with coronary angiogram N/A 05/11/2014    Procedure: LEFT HEART CATHETERIZATION WITH CORONARY ANGIOGRAM;  Surgeon: [name], MD;  Location: [facility] CATH LAB;  Service: Cardiovascular;  Laterality: N/A;  . Fractional flow reserve wire  05/11/2014    Procedure: FRACTIONAL FLOW RESERVE WIRE;  Surgeon: [name], MD;  Location: [facility] CATH LAB;  Service:  Cardiovascular;;    There were no vitals filed for this visit.  Visit Diagnosis:  Abnormality of gait  Postural instability      Subjective Assessment - 11/15/15 0808    Subjective Pt is a 70 year old male who presents to OP PT with history of falls-beginning June 2015 with a fall from the roof due to passing out.  Pt has had several additional falls, with history of hematoma/craniotomy late 2015.  He reports "not being able to catch balance"-he notes if he trips, he cannot stop from falling.  Most recent fall was several weeks ago while playing basketball and he had difficulty getting up from that fall.  He does not use assistive device with gait.   Pertinent History History of falls since June 2015, hematoma s/p craniotomy   Patient Stated Goals Pt's goal for therapy is to stop falling and to be able to get up if he falls.   Currently in Pain? No/denies            [facility] PT Assessment - 11/15/15 0815    Assessment   Medical Diagnosis falls   Referring Provider Rosalin Hawking   Precautions   Precautions Fall   Balance Screen   Has the patient fallen in the past 6 months Yes   How many times? 2   Has the patient had a decrease  in activity level because of a fear of falling?  No   Is the patient reluctant to leave their home because of a fear of falling?  No   Home Environment   Living Environment Private residence   Living Arrangements Spouse/significant other   Available Help at Discharge Family   Type of [place] to enter   Entrance Stairs-Number of Steps 2   Entrance Stairs-Rails None   Home Layout Two level;Bed/bath upstairs   Home Equipment None   Additional Comments Pt describes difficulty when carrying items up the steps.  Pt had one fall coming down the steps.   Prior Function   Level of Independence Independent with basic ADLs;Independent with household mobility without device;Independent with community mobility without device   Vocation  Retired;Part time employment  computer work   Leisure Has been going to cardiac rehab or the gym   Observation/Other Assessments   Focus on Therapeutic Outcomes (FOTO)  Primary intake measure:  81; ABC scale score:  65.6%   ROM / Strength   AROM / PROM / Strength Strength   Strength   Overall Strength Within functional limits for tasks performed   Overall Strength Comments Grossly tested bilateral lower extremities at least 4+/5.  Pt does describe occasional pain in R knee with stairs   Ambulation/Gait   Ambulation/Gait Yes   Ambulation/Gait Assistance 7: Independent   Ambulation Distance (Feet) 200 Feet   Assistive device None   Gait Pattern Step-through pattern  slowed gait with environmental scanning   Ambulation Surface Indoor;Level   Gait velocity 8.47 sec = 3.87 ft/sec   Standardized Balance Assessment   Standardized Balance Assessment Dynamic Gait Index;Timed Up and Go Test   Balance Master Testing Sensory Organization Test   Dynamic Gait Index   Level Surface Mild Impairment   Change in Gait Speed Normal   Gait with Horizontal Head Turns Mild Impairment   Gait with Vertical Head Turns Mild Impairment   Gait and Pivot Turn Normal   Step Over Obstacle Moderate Impairment   Step Around Obstacles Normal   Steps Mild Impairment   Total Score 18   DGI comment: Scores <19/24 indicate increased fall risk.   Timed Up and Go Test   Normal TUG (seconds) 12.84   Balance Master Testing    Results Increased sway noted on coniditions 2 and 3.  Composite balance score 73; somatosensory, visual, vestibular system use appear WFL on SOT.                                PT Long Term Goals - 11/16/15 1310    PT LONG TERM GOAL #1   Title Pt will be independent with HEP to address balance and dynamic gait.  TARGET 12/15/15   Time 4   Period Weeks   Status New   PT LONG TERM GOAL #2   Title Pt will improve Dynamic Gait Index score to at least 21/24 for decreased  fall risk.   Time 4   Period Weeks   Status New   PT LONG TERM GOAL #3   Title Pt will report improvement on ABC scale score by at least 10% for improved confidence with functional activities.   PT LONG TERM GOAL #4   Title Pt will perform floor>stand transfer modified independently, for safe fall recovery.   Time 4   Period Weeks   Status New  PT LONG TERM GOAL #5   Title Pt will verbalize understanding of fall prevention within the home environment.   Time 4   Period Weeks   Status New   Additional Long Term Goals   Additional Long Term Goals Yes   PT LONG TERM GOAL #6   Title Pt will negotiate at least 4 steps with no rails, carrying objects, modified independent, for safe stair negotiation.   Time 4   Period Weeks   Status New               Plan - 12/04/2015 1304    Clinical Impression Statement Pt is a 70 year old male who presents to OP PT with history of falls beginning in 2015.  Pt has had 2 falls in the past 6 months, with most recent fall several weeks ago while playing with grandson.  Pt notes difficulty getting up from the floor after a fall and difficulty negotiating steps while carrying objects.  He is very active with grandson, but notes decreased activity level while with him at [facility] in December due to motion sensitivity.  Pt is noted to be at increased fall risk on Dynamic Gait Index.  Sensory Orgnanization test performed, with pt scoring WFL; however, pt noted to have increased sway on several conditions.  Pt  would benefit from skilled PT to address balance deficits, fall recovery and HEP for balance.   Pt will benefit from skilled therapeutic intervention in order to improve on the following deficits Abnormal gait;Decreased balance   Rehab Potential Good   PT Frequency 2x / week   PT Duration 4 weeks  plus eval   PT Treatment/Interventions ADLs/Self Care Home Management;Therapeutic exercise;Therapeutic activities;Functional mobility training;Gait  training;Balance training;Neuromuscular re-education;Patient/family education   PT Next Visit Plan Initiate counter balance and corner balance exercises; work on compliant surfaces, floor>stand transfers.   Consulted and Agree with Plan of Care Patient          G-Codes - Dec 04, 2015 1313    Functional Assessment Tool Used Dynamic Gait Index 19/24, 2 falls in past 6 months, pt reported unable to get up from floor last fall   Functional Limitation Mobility: Walking and moving around   Mobility: Walking and Moving Around Current Status [phone]) At least 20 percent but less than 40 percent impaired, limited or restricted   Mobility: Walking and Moving Around Goal Status [phone]) At least 1 percent but less than 20 percent impaired, limited or restricted       Problem List Patient Active Problem List   Diagnosis Date Noted  . Left wrist pain 10/18/2015  . Skin tear of elbow without complication [id number]  . Abrasions of multiple sites 10/18/2015  . Fall at home 10/18/2015  . Subdural hematoma ([place]) 04/24/2015  . HLD (hyperlipidemia) 04/24/2015  . Preventative health care 03/29/2015  . Bilateral shoulder pain 03/29/2015  . Abdominal aortic aneurysm ([place]) 12/30/2014  . Congestive dilated cardiomyopathy ([place]) 12/30/2014  . Essential hypertension 11/23/2014  . Hyperlipidemia 10/04/2014  . SDH (subdural hematoma) ([place]) 09/06/2014  . Coronary atherosclerosis of native coronary artery 05/11/2014  . Obesity (BMI 30.0-34.9) 04/04/2014  . Seizure ([place]) 04/02/2014  . Syncope 04/02/2014  . SAH (subarachnoid hemorrhage) ([place]) 04/01/2014  . OSA (obstructive sleep apnea) 07/22/2013  . Hypertensive heart disease   . History of kidney stones   . Diabetes mellitus type 2, noninsulin dependent ([place])   . Hypothyroidism 05/07/2007    MARRIOTT,AMY W. 12-04-15, 1:16 PM Frazier Butt., PT  [facility]  [place] [address] [place], [place],  [address] Phone: [phone]   Fax:  [phone]  Name: Allen Myers MRN: [id number] Date of Birth: 1946-01-11

## 2015-11-17 ENCOUNTER — Ambulatory Visit: Payer: Medicare Other | Admitting: Physical Therapy

## 2015-11-17 DIAGNOSIS — R293 Abnormal posture: Secondary | ICD-10-CM

## 2015-11-17 DIAGNOSIS — R269 Unspecified abnormalities of gait and mobility: Secondary | ICD-10-CM

## 2015-11-17 NOTE — Patient Instructions (Signed)
Feet Apart, Head Motion - Eyes Open    With eyes open, feet apart, move head slowly: up and down 5-10 times, then move head side to side 5-10 times.   Do _1-2___ sessions per day.  Copyright  VHI. All rights reserved.  Feet Apart (Compliant Surface) Head Motion - Eyes Open    With eyes open, standing on compliant surface: _pillow or folded towel_______, feet shoulder width apart, move head slowly: up and down5-10 times, then side to side 5-10 times.  Do _1-2___ sessions per day.  Copyright  VHI. All rights reserved.  Feet Apart, Varied Arm Positions - Eyes Closed    Stand with feet shoulder width apart and arms by your sides. Close eyes and visualize upright position. Hold __10__ seconds. Repeat ___3_ times per session. Do __1-2__ sessions per day.  YOU WILL NOW REPEAT ALL OF THE ABOVE EXERCISES WITH YOUR FEET TOGETHER.  Copyright  VHI. All rights reserved.  Single Leg - Eyes Open    Holding support, lift right leg while maintaining balance over other leg.  Look ahead and stand tall, tighten through your abdominal muscles. Progress to removing hands from support surface for longer periods of time. Hold__10__ seconds. Repeat _3___ times per session. Do __1-2__ sessions per day.  Copyright  VHI. All rights reserved.  Feet Heel-Toe "Tandem", Varied Arm Positions - Eyes Open    Standing at counter, hold to support if needed.  With eyes open, right foot directly in front of the other, arms out, look straight ahead at a stationary object. Hold __10__ seconds. Repeat _3___ times per session. Do _1-2___ sessions per day.  Copyright  VHI. All rights reserved.  HIP: Hamstrings - Short Sitting    Rest leg on raised surface. Keep knee straight. Sit tall and keep chest lifted. Hold _30__ seconds.  Repeat with other leg. _3__ reps per set, _1-2__ sets per day.  Copyright  VHI. All rights reserved.

## 2015-11-17 NOTE — Therapy (Signed)
[facility] [address] [place], [place], [address] Phone: [phone]   Fax:  [phone]  Physical Therapy Treatment  Patient Details  Name: Allen Myers MRN: [id number] Date of Birth: Mar 01, 1946 Referring Provider: Rosalin Hawking  Encounter Date: 11/17/2015      PT End of Session - 11/17/15 0859    Visit Number 2   Number of Visits 9   Date for PT Re-Evaluation 01/14/16   Authorization Type Medicare-Gcode every 10th visit   PT Start Time 0804   PT Stop Time 0843   PT Time Calculation (min) 39 min   Activity Tolerance Patient tolerated treatment well   Behavior During Therapy [facility] for tasks assessed/performed      Past Medical History  Diagnosis Date  . Hyperlipidemia   . History of colon polyps   . History of diverticulitis   . Erectile dysfunction   . HYPERTENSION   . SDH (subdural hematoma) (HCC)     Following syncopal episode  . SAH (subarachnoid hemorrhage) ([place])     Following syncopal episode  . Kidney stones     "passed it"  . Sleep apnea in adult   . Hypothyroidism   . Depression   . Type II diabetes mellitus ([place])   . Seizures ([place])     just once a few weeks ago" (05/11/2014)    Past Surgical History  Procedure Laterality Date  . Colonoscopy    . Polypectomy    . Coronary angioplasty with stent placement  05/11/2014    "1"  . Craniotomy Left 09/12/2014    Procedure: CRANIOTOMY HEMATOMA EVACUATION SUBDURAL;  Surgeon: Erline Levine, MD;  Location: [facility] NEURO ORS;  Service: Neurosurgery;  Laterality: Left;  . Left heart catheterization with coronary angiogram N/A 05/11/2014    Procedure: LEFT HEART CATHETERIZATION WITH CORONARY ANGIOGRAM;  Surgeon: [name], MD;  Location: [facility] CATH LAB;  Service: Cardiovascular;  Laterality: N/A;  . Fractional flow reserve wire  05/11/2014    Procedure: FRACTIONAL FLOW RESERVE WIRE;  Surgeon: [name], MD;  Location: [facility] CATH LAB;  Service:  Cardiovascular;;    There were no vitals filed for this visit.  Visit Diagnosis:  Abnormality of gait  Postural instability      Subjective Assessment - 11/17/15 0806    Subjective No changes, no falls since eval the other day.   Currently in Pain? No/denies                  MMT performed to bilateral hip abductors due to noticeable lateral lean/Trendelenburg type gait pattern: R hip abduction 3+/5 L hip abduction 3+/5       [facility] Adult PT Treatment/Exercise - 11/17/15 0001    Exercises   Exercises Knee/Hip   Knee/Hip Exercises: Stretches   Active Hamstring Stretch Right;Left;3 reps;30 seconds  seated position             Balance Exercises - 11/17/15 0809    Balance Exercises: Standing   Standing Eyes Opened Wide (BOA);Narrow base of support (BOS);Head turns;Foam/compliant surface;Solid surface;5 reps  Head nods   Standing Eyes Closed Wide (BOA);Narrow base of support (BOS);Foam/compliant surface;Solid surface  1 episode of forward lean with EC feet together on foam   Tandem Stance Eyes open;2 reps;10 secs  solid surface   SLS Eyes open;Solid surface;2 reps;10 secs  cues for upright posture, visual target   Other Standing Exercises Heel/toe raises x 10 reps, then wall bumps x 10 reps  for hip/ankle strategy work.  Alternating step taps to 6" and 12" step with UE support, cues for upright posture and abdominal activation.  Pt noted to have knee flexion with wall bumps-?tight HS           PT Education - 11/17/15 0858    Education provided Yes   Education Details HEP-see instructions   Person(s) Educated Patient   Methods Explanation;Demonstration;Handout   Comprehension Verbalized understanding;Returned demonstration             PT Long Term Goals - 11/16/15 1310    PT LONG TERM GOAL #1   Title Pt will be independent with HEP to address balance and dynamic gait.  TARGET 12/15/15   Time 4   Period Weeks   Status New   PT LONG TERM GOAL  #2   Title Pt will improve Dynamic Gait Index score to at least 21/24 for decreased fall risk.   Time 4   Period Weeks   Status New   PT LONG TERM GOAL #3   Title Pt will report improvement on ABC scale score by at least 10% for improved confidence with functional activities.   PT LONG TERM GOAL #4   Title Pt will perform floor>stand transfer modified independently, for safe fall recovery.   Time 4   Period Weeks   Status New   PT LONG TERM GOAL #5   Title Pt will verbalize understanding of fall prevention within the home environment.   Time 4   Period Weeks   Status New   Additional Long Term Goals   Additional Long Term Goals Yes   PT LONG TERM GOAL #6   Title Pt will negotiate at least 4 steps with no rails, carrying objects, modified independent, for safe stair negotiation.   Time 4   Period Weeks   Status New               Plan - 11/17/15 0859    Clinical Impression Statement Treatment session focused today on initiating exercises to address balance/vestibular system, with corner balance exercises initiated on solid surface and on foam.  Pt does have increased sway with narrow BOS on foam with increased forward lean.  With wall bump exercises, pt noted to have slightly flexed knees, indicating possible hamstring tightness.  Pt also demonstrates Trendelenburg pattern with gait and with MMT of hip abductors is noted to have bilateral hip weakness.  Pt will continue to benefit from further skilled PT to address balance and dynamic gait.   Pt will benefit from skilled therapeutic intervention in order to improve on the following deficits Abnormal gait;Decreased balance   Rehab Potential Good   PT Frequency 2x / week   PT Duration 4 weeks  plus eval   PT Treatment/Interventions ADLs/Self Care Home Management;Therapeutic exercise;Therapeutic activities;Functional mobility training;Gait training;Balance training;Neuromuscular re-education;Patient/family education   PT Next  Visit Plan Initiate counter balance and corner balance exercises; work on compliant surfaces, floor>stand transfers.   Consulted and Agree with Plan of Care Patient       Problem List Patient Active Problem List   Diagnosis Date Noted  . Left wrist pain 10/18/2015  . Skin tear of elbow without complication [id number]  . Abrasions of multiple sites 10/18/2015  . Fall at home 10/18/2015  . Subdural hematoma ([place]) 04/24/2015  . HLD (hyperlipidemia) 04/24/2015  . Preventative health care 03/29/2015  . Bilateral shoulder pain 03/29/2015  . Abdominal aortic aneurysm ([place]) 12/30/2014  . Congestive dilated cardiomyopathy ([place])  12/30/2014  . Essential hypertension 11/23/2014  . Hyperlipidemia 10/04/2014  . SDH (subdural hematoma) ([place]) 09/06/2014  . Coronary atherosclerosis of native coronary artery 05/11/2014  . Obesity (BMI 30.0-34.9) 04/04/2014  . Seizure ([place]) 04/02/2014  . Syncope 04/02/2014  . SAH (subarachnoid hemorrhage) ([place]) 04/01/2014  . OSA (obstructive sleep apnea) 07/22/2013  . Hypertensive heart disease   . History of kidney stones   . Diabetes mellitus type 2, noninsulin dependent ([place])   . Hypothyroidism 05/07/2007    Dominique Calvey W. 11/17/2015, 9:07 AM  Frazier Butt., PT  [facility] [address] [place], [place], [address] Phone: [phone]   Fax:  [phone]  Name: NATION KACKLEY MRN: [id number] Date of Birth: Feb 19, 1946

## 2015-11-20 ENCOUNTER — Encounter (HOSPITAL_COMMUNITY): Payer: Self-pay

## 2015-11-21 ENCOUNTER — Encounter (HOSPITAL_COMMUNITY): Payer: Self-pay

## 2015-11-22 ENCOUNTER — Ambulatory Visit: Payer: Medicare Other | Admitting: Physical Therapy

## 2015-11-22 DIAGNOSIS — R269 Unspecified abnormalities of gait and mobility: Secondary | ICD-10-CM

## 2015-11-22 DIAGNOSIS — R293 Abnormal posture: Secondary | ICD-10-CM

## 2015-11-22 NOTE — Therapy (Signed)
[facility] [address] [place], [place], [address] Phone: [phone]   Fax:  [phone]  Physical Therapy Treatment  Patient Details  Name: Allen Myers MRN: [id number] Date of Birth: 1946-05-02 Referring Provider: Rosalin Hawking  Encounter Date: 11/22/2015      PT End of Session - 11/22/15 1311    Visit Number 3   Number of Visits 9   Date for PT Re-Evaluation 01/14/16   Authorization Type Medicare-Gcode every 10th visit   PT Start Time 1156   PT Stop Time 1236   PT Time Calculation (min) 40 min   Equipment Utilized During Treatment Gait belt   Activity Tolerance Patient tolerated treatment well   Behavior During Therapy [facility] for tasks assessed/performed      Past Medical History  Diagnosis Date  . Hyperlipidemia   . History of colon polyps   . History of diverticulitis   . Erectile dysfunction   . HYPERTENSION   . SDH (subdural hematoma) (HCC)     Following syncopal episode  . SAH (subarachnoid hemorrhage) ([place])     Following syncopal episode  . Kidney stones     "passed it"  . Sleep apnea in adult   . Hypothyroidism   . Depression   . Type II diabetes mellitus ([place])   . Seizures ([place])     just once a few weeks ago" (05/11/2014)    Past Surgical History  Procedure Laterality Date  . Colonoscopy    . Polypectomy    . Coronary angioplasty with stent placement  05/11/2014    "1"  . Craniotomy Left 09/12/2014    Procedure: CRANIOTOMY HEMATOMA EVACUATION SUBDURAL;  Surgeon: Erline Levine, MD;  Location: [place]S;  Service: Neurosurgery;  Laterality: Left;  . Left heart catheterization with coronary angiogram N/A 05/11/2014    Procedure: LEFT HEART CATHETERIZATION WITH CORONARY ANGIOGRAM;  Surgeon: [name], MD;  Location: [facility] CATH LAB;  Service: Cardiovascular;  Laterality: N/A;  . Fractional flow reserve wire  05/11/2014    Procedure: FRACTIONAL FLOW RESERVE WIRE;  Surgeon: [name], MD;  Location: [facility] C[facility] LAB;  Service: Cardiovascular;;    There were no vitals filed for this visit.  Visit Diagnosis:  Postural instability  Abnormality of gait      Subjective Assessment - 11/22/15 1158    Subjective Just having trouble getting here on time in the middle of the day.  [facility] of [facility].   Pertinent History History of falls since June 2015, hematoma s/p craniotomy   Patient Stated Goals Pt's goal for therapy is to stop falling and to be able to get up if he falls.   Currently in Pain? No/denies                              Balance Exercises - 11/22/15 1302    Balance Exercises: Standing   Standing Eyes Opened Narrow base of support (BOS);Wide (BOA);Head turns;Solid surface;Foam/compliant surface;Other reps (comment)  head nods, each x 10 reps   Standing Eyes Closed Narrow base of support (BOS);Wide (BOA);Head turns;Solid surface;Foam/compliant surface  also head nods, 10 reps each; fingertip support at chair   Tandem Stance Eyes open;2 reps;10 secs  solid surface   SLS Eyes open;Solid surface;2 reps;10 secs   Rockerboard Anterior/posterior;EO  hip/ankle strategy work x 15 reps each; arm swing  Balance Beam foam balance beam-marching in place x 15 reps, then forward kicks 10 reps, forward step taps, alternating legs 10 reps with UE support in parallel bars   Gait with Head Turns Forward;2 reps  40 ft; also gait with head nods 2 reps 40 ft, close S of PT   Sidestepping 3 reps;Other (comment)  Up and down ramp with min guard assist   Other Standing Exercises On incline/decline of ramp-head turns/head nods x 5 reps each, then eyes closed head steady x 10 sec; marching in place x 10, forward step taps x 10.  Pt reports harder on incline than decline   Overall Comments --  gait x 230 ft with ball toss/catch with slight lateral veer     Reviewed HEP-pt return demo understanding.  Pt performs  standing hip abduction x 10 reps each leg for improved hip stability in single limb stance.  Cues provided for upright posture/abdominal activation.      PT Education - 11/22/15 1310    Education provided Yes   Education Details Addition to HEP-closing eyes with head turns/nods for previously issued HEP   Person(s) Educated Patient   Methods Explanation;Demonstration   Comprehension Verbalized understanding;Returned demonstration             PT Long Term Goals - 11/16/15 1310    PT LONG TERM GOAL #1   Title Pt will be independent with HEP to address balance and dynamic gait.  TARGET 12/15/15   Time 4   Period Weeks   Status New   PT LONG TERM GOAL #2   Title Pt will improve Dynamic Gait Index score to at least 21/24 for decreased fall risk.   Time 4   Period Weeks   Status New   PT LONG TERM GOAL #3   Title Pt will report improvement on ABC scale score by at least 10% for improved confidence with functional activities.   PT LONG TERM GOAL #4   Title Pt will perform floor>stand transfer modified independently, for safe fall recovery.   Time 4   Period Weeks   Status New   PT LONG TERM GOAL #5   Title Pt will verbalize understanding of fall prevention within the home environment.   Time 4   Period Weeks   Status New   Additional Long Term Goals   Additional Long Term Goals Yes   PT LONG TERM GOAL #6   Title Pt will negotiate at least 4 steps with no rails, carrying objects, modified independent, for safe stair negotiation.   Time 4   Period Weeks   Status New               Plan - 11/22/15 1311    Clinical Impression Statement Pt has increased difficulty maintaining balance without UE support on compliant surfaces with head movements and closed eyes.  With gait with head turns/nods and gait with ball toss, pt has wide BOS and veers laterally at times, but does not lose balance.  Pt will continue to benefit from further skilled PT to address balance and gait.    Pt will benefit from skilled therapeutic intervention in order to improve on the following deficits Abnormal gait;Decreased balance   Rehab Potential Good   PT Frequency 2x / week   PT Duration 4 weeks  plus eval   PT Treatment/Interventions ADLs/Self Care Home Management;Therapeutic exercise;Therapeutic activities;Functional mobility training;Gait training;Balance training;Neuromuscular re-education;Patient/family education   PT Next Visit Plan Review corner balance ex with eyes  closed, continue compliant surfaces, progression with gait/compliant surfaces with head movements   Consulted and Agree with Plan of Care Patient        Problem List Patient Active Problem List   Diagnosis Date Noted  . Left wrist pain 10/18/2015  . Skin tear of elbow without complication [id number]  . Abrasions of multiple sites 10/18/2015  . Fall at home 10/18/2015  . Subdural hematoma ([place]) 04/24/2015  . HLD (hyperlipidemia) 04/24/2015  . Preventative health care 03/29/2015  . Bilateral shoulder pain 03/29/2015  . Abdominal aortic aneurysm ([place]) 12/30/2014  . Congestive dilated cardiomyopathy ([place]) 12/30/2014  . Essential hypertension 11/23/2014  . Hyperlipidemia 10/04/2014  . SDH (subdural hematoma) ([place]) 09/06/2014  . Coronary atherosclerosis of native coronary artery 05/11/2014  . Obesity (BMI 30.0-34.9) 04/04/2014  . Seizure ([place]) 04/02/2014  . Syncope 04/02/2014  . SAH (subarachnoid hemorrhage) ([place]) 04/01/2014  . OSA (obstructive sleep apnea) 07/22/2013  . Hypertensive heart disease   . History of kidney stones   . Diabetes mellitus type 2, noninsulin dependent ([place])   . Hypothyroidism 05/07/2007    Zari Cly W. 11/22/2015, 1:13 PM  Frazier Butt., PT  [facility] [address] [place], [place], [address] Phone: [phone]   Fax:  [phone]  Name: Allen Myers MRN: [id number] Date of Birth: Sep 08, 1946

## 2015-11-22 NOTE — Patient Instructions (Signed)
For previously issued corner balance exercises on solid surface (feet apart and feet together with head turns)>progress head turns and head nods to eyes closed.  You can try this for the compliant surfaces as well, but you will need to hold onto support at the chair in front of you to steady yourself.

## 2015-11-23 ENCOUNTER — Encounter (HOSPITAL_COMMUNITY)
Admission: RE | Admit: 2015-11-23 | Discharge: 2015-11-23 | Disposition: A | Discharge status: Home / Self Care | Payer: Self-pay | Source: Ambulatory Visit | Attending: Cardiology | Admitting: Cardiology

## 2015-11-24 ENCOUNTER — Ambulatory Visit: Payer: Medicare Other | Admitting: Physical Therapy

## 2015-11-24 DIAGNOSIS — R269 Unspecified abnormalities of gait and mobility: Secondary | ICD-10-CM | POA: Diagnosis not present

## 2015-11-24 DIAGNOSIS — R293 Abnormal posture: Secondary | ICD-10-CM | POA: Diagnosis not present

## 2015-11-25 NOTE — Therapy (Signed)
[facility] [address] [place], [place], [address] Phone: [phone]   Fax:  [phone]  Physical Therapy Treatment  Patient Details  Name: Allen Myers MRN: [id number] Date of Birth: July 12, 1946 Referring Provider: Rosalin Hawking  Encounter Date: 11/24/2015      PT End of Session - 11/25/15 1424    Visit Number 4   Number of Visits 9   Date for PT Re-Evaluation 01/14/16   Authorization Type Medicare-Gcode every 10th visit   PT Start Time 0933   PT Stop Time 1016   PT Time Calculation (min) 43 min   Equipment Utilized During Treatment Gait belt   Activity Tolerance Patient tolerated treatment well   Behavior During Therapy [facility] for tasks assessed/performed      Past Medical History  Diagnosis Date  . Hyperlipidemia   . History of colon polyps   . History of diverticulitis   . Erectile dysfunction   . HYPERTENSION   . SDH (subdural hematoma) (HCC)     Following syncopal episode  . SAH (subarachnoid hemorrhage) ([place])     Following syncopal episode  . Kidney stones     "passed it"  . Sleep apnea in adult   . Hypothyroidism   . Depression   . Type II diabetes mellitus ([place])   . Seizures ([place])     just once a few weeks ago" (05/11/2014)    Past Surgical History  Procedure Laterality Date  . Colonoscopy    . Polypectomy    . Coronary angioplasty with stent placement  05/11/2014    "1"  . Craniotomy Left 09/12/2014    Procedure: CRANIOTOMY HEMATOMA EVACUATION SUBDURAL;  Surgeon: Erline Levine, MD;  Location: [facility]UR[facility]S;  Service: Neurosurgery;  Laterality: Left;  . Left heart catheterization with coronary angiogram N/A 05/11/2014    Procedure: LEFT HEART CATHETERIZATION WITH CORONARY ANGIOGRAM;  Surgeon: [name], MD;  Location: [facility] C[facility] LAB;  Service: Cardiovascular;  Laterality: N/A;  . Fractional flow reserve wire  05/11/2014    Procedure: FRACTIONAL FLOW RESERVE WIRE;  Surgeon: [name], MD;  Location: [facility] CATH LAB;  Service: Cardiovascular;;    There were no vitals filed for this visit.  Visit Diagnosis:  Postural instability  Abnormality of gait      Subjective Assessment - 11/24/15 0936    Subjective Did the exercises with eyes closed-had to hold onto counter.  No other changes.   Currently in Pain? No/denies                         [facility] Adult PT Treatment/Exercise - 11/25/15 0001    Ambulation/Gait   Ambulation/Gait Yes   Ambulation/Gait Assistance 5: Supervision   Ambulation Distance (Feet) 230 Feet  x 2, then400 ft, then 150 ft   Assistive device None   Gait Comments Gait activities with head turns, using cards, ball reach, environmental scanning with cards, then gait with ball toss and catch.  No LOB, but pt does tend to widen BOS.   High Level Balance   High Level Balance Activities Side stepping;Backward walking;Head turns;Tandem walking;Marching forwards;Marching backwards  forwards walk on red mat surface, 3 reps each ex   High Level Balance Comments With red mat compliant surface activities, pt requires min guard assistance.  Also pt performs side stepping taps and kicks to cones on red compliant mat surface with min guard assistance, 2 reps length of mat.  Balance Exercises - 11/24/15 0938    Balance Exercises: Standing   Standing Eyes Opened Wide (BOA);Narrow base of support (BOS);Head turns;Foam/compliant surface  Head nods 10 reps each   Standing Eyes Closed Narrow base of support (BOS);Wide (BOA);Head turns;Foam/compliant surface  Head nods, 10 reps each   Sidestepping 3 reps;Other (comment)  up and down ramp   Other Standing Exercises On incline/decline of ramp-head turns/head nods x 5 reps each, then eyes closed head steady x 10 sec; marching in place x 10, forward step taps x 10.                PT Long Term Goals - 11/16/15 1310    PT LONG TERM GOAL #1   Title Pt will be independent with  HEP to address balance and dynamic gait.  TARGET 12/15/15   Time 4   Period Weeks   Status New   PT LONG TERM GOAL #2   Title Pt will improve Dynamic Gait Index score to at least 21/24 for decreased fall risk.   Time 4   Period Weeks   Status New   PT LONG TERM GOAL #3   Title Pt will report improvement on ABC scale score by at least 10% for improved confidence with functional activities.   PT LONG TERM GOAL #4   Title Pt will perform floor>stand transfer modified independently, for safe fall recovery.   Time 4   Period Weeks   Status New   PT LONG TERM GOAL #5   Title Pt will verbalize understanding of fall prevention within the home environment.   Time 4   Period Weeks   Status New   Additional Long Term Goals   Additional Long Term Goals Yes   PT LONG TERM GOAL #6   Title Pt will negotiate at least 4 steps with no rails, carrying objects, modified independent, for safe stair negotiation.   Time 4   Period Weeks   Status New               Plan - 11/25/15 1425    Clinical Impression Statement Pt is tolerating progression of balance exercises well.  He does prefer stance with wider BOS with challenging gait tasks.   Pt will benefit from skilled therapeutic intervention in order to improve on the following deficits Abnormal gait;Decreased balance   Rehab Potential Good   PT Frequency 2x / week   PT Duration 4 weeks  plus eval   PT Treatment/Interventions ADLs/Self Care Home Management;Therapeutic exercise;Therapeutic activities;Functional mobility training;Gait training;Balance training;Neuromuscular re-education;Patient/family education   PT Next Visit Plan  continue compliant surfaces, progression with gait/compliant surfaces with head movements   Consulted and Agree with Plan of Care Patient        Problem List Patient Active Problem List   Diagnosis Date Noted  . Left wrist pain 10/18/2015  . Skin tear of elbow without complication Q000111Q  . Abrasions of  multiple sites 10/18/2015  . Fall at home 10/18/2015  . Subdural hematoma ([place]) 04/24/2015  . HLD (hyperlipidemia) 04/24/2015  . Preventative health care 03/29/2015  . Bilateral shoulder pain 03/29/2015  . Abdominal aortic aneurysm ([place]) 12/30/2014  . Congestive dilated cardiomyopathy ([place]) 12/30/2014  . Essential hypertension 11/23/2014  . Hyperlipidemia 10/04/2014  . SDH (subdural hematoma) ([place]) 09/06/2014  . Coronary atherosclerosis of native coronary artery 05/11/2014  . Obesity (BMI 30.0-34.9) 04/04/2014  . Seizure ([place]) 04/02/2014  . Syncope 04/02/2014  . SAH (subarachnoid hemorrhage) ([place]) 04/01/2014  . OSA (  obstructive sleep apnea) 07/22/2013  . Hypertensive heart disease   . History of kidney stones   . Diabetes mellitus type 2, noninsulin dependent ([place])   . Hypothyroidism 05/07/2007    Jaylyn Iyer W. 11/25/2015, 2:26 PM  Frazier Butt., PT  [facility] [address] [place], [place], [address] Phone: [phone]   Fax:  [phone]  Name: Allen Myers MRN: [id number] Date of Birth: 1946-02-08

## 2015-11-27 ENCOUNTER — Encounter (HOSPITAL_COMMUNITY)
Admission: RE | Admit: 2015-11-27 | Discharge: 2015-11-27 | Disposition: A | Discharge status: Home / Self Care | Payer: Self-pay | Source: Ambulatory Visit | Attending: Cardiology | Admitting: Cardiology

## 2015-11-28 ENCOUNTER — Encounter (HOSPITAL_COMMUNITY): Payer: Self-pay

## 2015-11-29 ENCOUNTER — Encounter (HOSPITAL_COMMUNITY)
Admission: RE | Admit: 2015-11-29 | Discharge: 2015-11-29 | Disposition: A | Discharge status: Home / Self Care | Payer: Self-pay | Source: Ambulatory Visit | Attending: Cardiology | Admitting: Cardiology

## 2015-11-29 ENCOUNTER — Ambulatory Visit: Payer: Medicare Other | Admitting: Physical Therapy

## 2015-11-29 DIAGNOSIS — R269 Unspecified abnormalities of gait and mobility: Secondary | ICD-10-CM

## 2015-11-29 DIAGNOSIS — R293 Abnormal posture: Secondary | ICD-10-CM | POA: Diagnosis not present

## 2015-11-29 NOTE — Therapy (Signed)
[facility] [address] [place], [place], [address] Phone: [phone]   Fax:  [phone]  Physical Therapy Treatment  Patient Details  Name: Allen Myers MRN: [id number] Date of Birth: [birth year] Referring Provider: Rosalin Hawking  Encounter Date: 11/29/2015      PT End of Session - 11/29/15 1413    Visit Number 5   Number of Visits 9   Date for PT Re-Evaluation 01/14/16   PT Start Time 1316   PT Stop Time 1357   PT Time Calculation (min) 41 min   Equipment Utilized During Treatment Gait belt   Activity Tolerance Patient tolerated treatment well   Behavior During Therapy [facility] for tasks assessed/performed      Past Medical History  Diagnosis Date  . Hyperlipidemia   . History of colon polyps   . History of diverticulitis   . Erectile dysfunction   . HYPERTENSION   . SDH (subdural hematoma) (HCC)     Following syncopal episode  . SAH (subarachnoid hemorrhage) ([place])     Following syncopal episode  . Kidney stones     "passed it"  . Sleep apnea in adult   . Hypothyroidism   . Depression   . Type II diabetes mellitus ([place])   . Seizures ([place])     just once a few weeks ago" (05/11/2014)    Past Surgical History  Procedure Laterality Date  . Colonoscopy    . Polypectomy    . Coronary angioplasty with stent placement  05/11/2014    "1"  . Craniotomy Left 09/12/2014    Procedure: CRANIOTOMY HEMATOMA EVACUATION SUBDURAL;  Surgeon: Erline Levine, MD;  Location: [facility]URO ORS;  Service: Neurosurgery;  Laterality: Left;  . Left heart catheterization with coronary angiogram N/A 05/11/2014    Procedure: LEFT HEART CATHETERIZATION WITH CORONARY ANGIOGRAM;  Surgeon: [name], MD;  Location: [facility] CATH LAB;  Service: Cardiovascular;  Laterality: N/A;  . Fractional flow reserve wire  05/11/2014    Procedure: FRACTIONAL FLOW RESERVE WIRE;  Surgeon: [name], MD;  Location: [facility] CATH LAB;  Service:  Cardiovascular;;    There were no vitals filed for this visit.  Visit Diagnosis:  Postural instability  Abnormality of gait      Subjective Assessment - 11/29/15 1318    Subjective (p) no complaints   Patient Stated Goals (p) Pt's goal for therapy is to stop falling and to be able to get up if he falls.   Currently in Pain? (p) No/denies         Neuro Re-ed   Dynamic gait/marching on compliant surface (with bean bags under mats): forwards/backwards with and without horizontal/vertical head turns; SLS to cones: single tap, double tap, kicking over/picking up all with minguard A  SLS on ramp with minguard A: single tap/double tap kicking over/picking up cones  Balance Master: ant/post postural sway to targets with varying surround and support; min A needed with LOB x3 with surround movement at 60%  Resistive walking forwards/backwards/sideways with lateral perturbations and supervision                           PT Long Term Goals - 11/16/15 1310    PT LONG TERM GOAL #1   Title Pt will be independent with HEP to address balance and dynamic gait.  TARGET 12/15/15   Time 4   Period Weeks   Status New   PT LONG  TERM GOAL #2   Title Pt will improve Dynamic Gait Index score to at least 21/24 for decreased fall risk.   Time 4   Period Weeks   Status New   PT LONG TERM GOAL #3   Title Pt will report improvement on ABC scale score by at least 10% for improved confidence with functional activities.   PT LONG TERM GOAL #4   Title Pt will perform floor>stand transfer modified independently, for safe fall recovery.   Time 4   Period Weeks   Status New   PT LONG TERM GOAL #5   Title Pt will verbalize understanding of fall prevention within the home environment.   Time 4   Period Weeks   Status New   Additional Long Term Goals   Additional Long Term Goals Yes   PT LONG TERM GOAL #6   Title Pt will negotiate at least 4 steps with no rails, carrying objects,  modified independent, for safe stair negotiation.   Time 4   Period Weeks   Status New               Plan - 11/29/15 1414    Clinical Impression Statement Pt tolerated exercises well with min difficulty with single limb stance activities.  Progressing well with activities and towards goals.   PT Next Visit Plan  continue compliant surfaces, progression with gait/compliant surfaces with head movements   Consulted and Agree with Plan of Care Patient        Problem List Patient Active Problem List   Diagnosis Date Noted  . Left wrist pain 10/18/2015  . Skin tear of elbow without complication Q000111Q  . Abrasions of multiple sites 10/18/2015  . Fall at home 10/18/2015  . Subdural hematoma ([place]) 04/24/2015  . HLD (hyperlipidemia) 04/24/2015  . Preventative health care 03/29/2015  . Bilateral shoulder pain 03/29/2015  . Abdominal aortic aneurysm ([place]) 12/30/2014  . Congestive dilated cardiomyopathy ([place]) 12/30/2014  . Essential hypertension 11/23/2014  . Hyperlipidemia 10/04/2014  . SDH (subdural hematoma) ([place]) 09/06/2014  . Coronary atherosclerosis of native coronary artery 05/11/2014  . Obesity (BMI 30.0-34.9) 04/04/2014  . Seizure ([place]) 04/02/2014  . Syncope 04/02/2014  . SAH (subarachnoid hemorrhage) ([place]) 04/01/2014  . OSA (obstructive sleep apnea) 07/22/2013  . Hypertensive heart disease   . History of kidney stones   . Diabetes mellitus type 2, noninsulin dependent ([place])   . Hypothyroidism 05/07/2007   Laureen Abrahams, PT, DPT 11/29/2015 2:16 PM  [facility] [address] [place], [place], [address] Phone: [phone]   Fax:  [phone]  Name: DEMETRY PARMETER MRN: [id number] Date of Birth: [birth year]

## 2015-11-30 ENCOUNTER — Encounter (HOSPITAL_COMMUNITY)
Admission: RE | Admit: 2015-11-30 | Discharge: 2015-11-30 | Disposition: A | Discharge status: Home / Self Care | Payer: Self-pay | Source: Ambulatory Visit | Attending: Cardiology | Admitting: Cardiology

## 2015-12-04 ENCOUNTER — Encounter (HOSPITAL_COMMUNITY): Payer: Self-pay

## 2015-12-05 ENCOUNTER — Ambulatory Visit: Payer: Medicare Other | Admitting: Physical Therapy

## 2015-12-05 ENCOUNTER — Encounter (HOSPITAL_COMMUNITY)
Admission: RE | Admit: 2015-12-05 | Discharge: 2015-12-05 | Disposition: A | Discharge status: Home / Self Care | Payer: Self-pay | Source: Ambulatory Visit | Attending: Cardiology | Admitting: Cardiology

## 2015-12-05 DIAGNOSIS — R293 Abnormal posture: Secondary | ICD-10-CM | POA: Diagnosis not present

## 2015-12-05 DIAGNOSIS — R269 Unspecified abnormalities of gait and mobility: Secondary | ICD-10-CM

## 2015-12-05 NOTE — Therapy (Signed)
[facility] [address] [place], [place], [address] Phone: [phone]   Fax:  [phone]  Physical Therapy Treatment  Patient Details  Name: Allen Myers MRN: [id number] Date of Birth: [birth year] Referring Provider: Rosalin Hawking  Encounter Date: 12/05/2015      PT End of Session - 12/05/15 1525    Visit Number 6   Number of Visits 9   Date for PT Re-Evaluation 01/14/16   Authorization Type Medicare-Gcode every 10th visit   PT Start Time 1410   PT Stop Time 1450   PT Time Calculation (min) 40 min      Past Medical History  Diagnosis Date  . Hyperlipidemia   . History of colon polyps   . History of diverticulitis   . Erectile dysfunction   . HYPERTENSION   . SDH (subdural hematoma) (H[facility])     Following syncopal episode  . SAH (subarachnoid hemorrhage) ([place])     Following syncopal episode  . Kidney stones     "passed it"  . Sleep apnea in adult   . Hypothyroidism   . Depression   . Type II diabetes mellitus ([place])   . Seizures ([place])     just once a few weeks ago" (05/11/2014)    Past Surgical History  Procedure Laterality Date  . Colonoscopy    . Polypectomy    . Coronary angioplasty with stent placement  05/11/2014    "1"  . Craniotomy Left 09/12/2014    Procedure: CRANIOTOMY HEMATOMA EVACUATION SUBDURAL;  Surgeon: Erline Levine, MD;  Location: [facility];  Service: Neurosurgery;  Laterality: Left;  . Left heart catheterization with coronary angiogram N/A 05/11/2014    Procedure: LEFT HEART CATHETERIZATION WITH CORONARY ANGIOGRAM;  Surgeon: [name], MD;  Location: [facility] CATH LAB;  Service: Cardiovascular;  Laterality: N/A;  . Fractional flow reserve wire  05/11/2014    Procedure: FRACTIONAL FLOW RESERVE WIRE;  Surgeon: [name], MD;  Location: [facility] CATH LAB;  Service: Cardiovascular;;    There were no vitals filed for this visit.  Visit Diagnosis:  Abnormality of gait       Subjective Assessment - 12/05/15 1522    Subjective Pt reports balance is much better - ready to finish up next session   Pertinent History History of falls since June 2015, hematoma s/p craniotomy   Patient Stated Goals Pt's goal for therapy is to stop falling and to be able to get up if he falls.   Currently in Pain? No/denies                              Balance Exercises - 12/05/15 1523    Balance Exercises: Standing   Standing Eyes Opened Wide (BOA);Narrow base of support (BOS);Head turns;Foam/compliant surface  Head nods 10 reps each   Standing Eyes Closed Narrow base of support (BOS);Wide (BOA);Head turns;Foam/compliant surface  Head nods, 10 reps each   Tandem Stance Eyes open;1 rep;10 secs;Foam/compliant surface   SLS Eyes open;Foam/compliant surface   Stepping Strategy Anterior;Posterior;10 reps   Rockerboard Anterior/posterior;Lateral;Head turns;30 seconds   Gait with Head Turns Forward;Retro;Foam/compliant surface   Cone Rotation Foam/compliant surface   Other Standing Exercises Cone taps alternating feet; tipping over and standing up      Resisted amb. With sports cord 240' (2 laps around track) with moderate perturbations - no LOB occurrred  PT Long Term Goals - 11/16/15 1310    PT LONG TERM GOAL #1   Title Pt will be independent with HEP to address balance and dynamic gait.  TARGET 12/15/15   Time 4   Period Weeks   Status New   PT LONG TERM GOAL #2   Title Pt will improve Dynamic Gait Index score to at least 21/24 for decreased fall risk.   Time 4   Period Weeks   Status New   PT LONG TERM GOAL #3   Title Pt will report improvement on ABC scale score by at least 10% for improved confidence with functional activities.   PT LONG TERM GOAL #4   Title Pt will perform floor>stand transfer modified independently, for safe fall recovery.   Time 4   Period Weeks   Status New   PT LONG TERM GOAL #5   Title Pt will verbalize  understanding of fall prevention within the home environment.   Time 4   Period Weeks   Status New   Additional Long Term Goals   Additional Long Term Goals Yes   PT LONG TERM GOAL #6   Title Pt will negotiate at least 4 steps with no rails, carrying objects, modified independent, for safe stair negotiation.   Time 4   Period Weeks   Status New               Plan - 12/05/15 1525    Clinical Impression Statement Pt had minimal difficulty with SLS activities on mat; tolerated resisted ambulation well with no LOB   Pt will benefit from skilled therapeutic intervention in order to improve on the following deficits Abnormal gait;Decreased balance   Rehab Potential Good   PT Frequency 2x / week   PT Duration 4 weeks   PT Treatment/Interventions ADLs/Self Care Home Management;Therapeutic exercise;Therapeutic activities;Functional mobility training;Gait training;Balance training;Neuromuscular re-education;Patient/family education   PT Next Visit Plan check LTG's - D/C?   Consulted and Agree with Plan of Care Patient        Problem List Patient Active Problem List   Diagnosis Date Noted  . Left wrist pain 10/18/2015  . Skin tear of elbow without complication [id number]  . Abrasions of multiple sites 10/18/2015  . Fall at home 10/18/2015  . Subdural hematoma ([place]) 04/24/2015  . HLD (hyperlipidemia) 04/24/2015  . Preventative health care 03/29/2015  . Bilateral shoulder pain 03/29/2015  . Abdominal aortic aneurysm ([place]) 12/30/2014  . Congestive dilated cardiomyopathy ([place]) 12/30/2014  . Essential hypertension 11/23/2014  . Hyperlipidemia 10/04/2014  . SDH (subdural hematoma) ([place]) 09/06/2014  . Coronary atherosclerosis of native coronary artery 05/11/2014  . Obesity (BMI 30.0-34.9) 04/04/2014  . Seizure ([place]) 04/02/2014  . Syncope 04/02/2014  . SAH (subarachnoid hemorrhage) ([place]) 04/01/2014  . OSA (obstructive sleep apnea) 07/22/2013  . Hypertensive heart disease   .  History of kidney stones   . Diabetes mellitus type 2, noninsulin dependent ([place])   . Hypothyroidism 05/07/2007    Alda Lea, PT 12/05/2015, 3:33 PM  [facility] [address] [place], [place], [address] Phone: [phone]   Fax:  [phone]  Name: Allen Myers MRN: [id number] Date of Birth: 16-Jan-1946

## 2015-12-07 ENCOUNTER — Encounter (HOSPITAL_COMMUNITY)
Admission: RE | Admit: 2015-12-07 | Discharge: 2015-12-07 | Disposition: A | Discharge status: Home / Self Care | Payer: Self-pay | Source: Ambulatory Visit | Attending: Cardiology | Admitting: Cardiology

## 2015-12-08 ENCOUNTER — Encounter (HOSPITAL_COMMUNITY)
Admission: RE | Admit: 2015-12-08 | Discharge: 2015-12-08 | Disposition: A | Discharge status: Home / Self Care | Payer: Self-pay | Source: Ambulatory Visit | Attending: Cardiology | Admitting: Cardiology

## 2015-12-11 ENCOUNTER — Encounter (HOSPITAL_COMMUNITY)
Admission: RE | Admit: 2015-12-11 | Discharge: 2015-12-11 | Disposition: A | Discharge status: Home / Self Care | Payer: Self-pay | Source: Ambulatory Visit | Attending: Cardiology | Admitting: Cardiology

## 2015-12-11 ENCOUNTER — Ambulatory Visit: Payer: Medicare Other | Admitting: *Deleted

## 2015-12-11 ENCOUNTER — Encounter: Payer: Self-pay | Admitting: *Deleted

## 2015-12-11 DIAGNOSIS — R293 Abnormal posture: Secondary | ICD-10-CM | POA: Diagnosis not present

## 2015-12-11 DIAGNOSIS — R269 Unspecified abnormalities of gait and mobility: Secondary | ICD-10-CM

## 2015-12-11 NOTE — Therapy (Signed)
[facility] [address] [place], [place], [address] Phone: [phone]   Fax:  [phone]  Physical Therapy Treatment  Patient Details  Name: Allen Myers MRN: [id number] Date of Birth: 1946-08-06 Referring Provider: Rosalin Hawking  Encounter Date: 12/11/2015      PT End of Session - 12/11/15 1022    Visit Number 7   Number of Visits 9   Date for PT Re-Evaluation 01/14/16   Authorization Type Medicare-Gcode every 10th visit   PT Start Time 1017   PT Stop Time 1057   PT Time Calculation (min) 40 min   Activity Tolerance Patient tolerated treatment well   Behavior During Therapy [facility] for tasks assessed/performed      Past Medical History  Diagnosis Date  . Hyperlipidemia   . History of colon polyps   . History of diverticulitis   . Erectile dysfunction   . HYPERTENSION   . SDH (subdural hematoma) (HCC)     Following syncopal episode  . SAH (subarachnoid hemorrhage) ([place])     Following syncopal episode  . Kidney stones     "passed it"  . Sleep apnea in adult   . Hypothyroidism   . Depression   . Type II diabetes mellitus ([place])   . Seizures ([place])     just once a few weeks ago" (05/11/2014)    Past Surgical History  Procedure Laterality Date  . Colonoscopy    . Polypectomy    . Coronary angioplasty with stent placement  05/11/2014    "1"  . Craniotomy Left 09/12/2014    Procedure: CRANIOTOMY HEMATOMA EVACUATION SUBDURAL;  Surgeon: Erline Levine, MD;  Location: [facility]UR[facility]S;  Service: Neurosurgery;  Laterality: Left;  . Left heart catheterization with coronary angiogram N/A 05/11/2014    Procedure: LEFT HEART CATHETERIZATION WITH CORONARY ANGIOGRAM;  Surgeon: [name], MD;  Location[facility] CATH LAB;  Service: Cardiovascular;  Laterality: N/A;  . Fractional flow reserve wire  05/11/2014    Procedure: FRACTIONAL FLOW RESERVE WIRE;  Surgeon: [name], MD;  Location: [facility] CATH LAB;  Service:  Cardiovascular;;    There were no vitals filed for this visit.  Visit Diagnosis:  Abnormality of gait  Postural instability      Subjective Assessment - 12/11/15 1020    Subjective Plans for this session to be his last visit. Agrees to discharge. Feels like therapy has helped him be more aware of what he can and cannot do.   Pertinent History History of falls since June 2015, hematoma s/p craniotomy   Patient Stated Goals Pt's goal for therapy is to stop falling and to be able to get up if he falls.   Currently in Pain? No/denies            [facility] PT Assessment - 12/11/15 0001    Dynamic Gait Index   Level Surface Normal   Change in Gait Speed Normal   Gait with Horizontal Head Turns Normal   Gait with Vertical Head Turns Normal   Gait and Pivot Turn Normal   Step Over Obstacle Normal   Step Around Obstacles Normal   Steps Normal   Total Score 24                             PT Education - 12/11/15 1141    Education provided Yes   Education Details Reviewed goals checked.   Person(s) Educated  Patient   Methods Explanation   Comprehension Verbalized understanding             PT Long Term Goals - 12/24/15 1022    PT LONG TERM GOAL #1   Title Pt will be independent with HEP to address balance and dynamic gait.  TARGET 12/15/15   Baseline Reports Independence with HEP understanding.   Time 4   Period Weeks   Status Achieved   PT LONG TERM GOAL #2   Title Pt will improve Dynamic Gait Index score to at least 21/24 for decreased fall risk.   Baseline 24/24   Time 4   Period Weeks   Status Achieved   PT LONG TERM GOAL #3   Title Pt will report improvement on ABC scale score by at least 10% for improved confidence with functional activities.   Status Achieved   PT LONG TERM GOAL #4   Title Pt will perform floor>stand transfer modified independently, for safe fall recovery.   Baseline met   Time 4   Period Weeks   Status Achieved   PT  LONG TERM GOAL #5   Title Pt will verbalize understanding of fall prevention within the home environment.   Baseline Verbalized understanding.   Time 4   Period Weeks   Status Achieved   PT LONG TERM GOAL #6   Title Pt will negotiate at least 4 steps with no rails, carrying objects, modified independent, for safe stair negotiation.   Time 4   Period Weeks   Status Achieved               Plan - 2015/12/24 1049    Clinical Impression Statement Pt met all LTGs and verbalized that he felt that therapy exercises and HEP has helped his balance, awareness, and confidence with functional mobility.   Pt will benefit from skilled therapeutic intervention in order to improve on the following deficits Abnormal gait;Decreased balance   Rehab Potential Good   PT Frequency 2x / week   PT Duration 4 weeks   PT Treatment/Interventions ADLs/Self Care Home Management;Therapeutic exercise;Therapeutic activities;Functional mobility training;Gait training;Balance training;Neuromuscular re-education;Patient/family education   PT Next Visit Plan D/C'ed pt today.   Consulted and Agree with Plan of Care Patient          G-Codes - 12-24-15 1152    Functional Assessment Tool Used DGI 24/24; no falls since starting PT;  Is able to perform floor transfer Mod I.       Problem List Patient Active Problem List   Diagnosis Date Noted  . Left wrist pain 10/18/2015  . Skin tear of elbow without complication 93/79/0240  . Abrasions of multiple sites 10/18/2015  . Fall at home 10/18/2015  . Subdural hematoma ([place]) 04/24/2015  . HLD (hyperlipidemia) 04/24/2015  . Preventative health care 03/29/2015  . Bilateral shoulder pain 03/29/2015  . Abdominal aortic aneurysm ([place]) 12/30/2014  . Congestive dilated cardiomyopathy ([place]) 12/30/2014  . Essential hypertension 11/23/2014  . Hyperlipidemia 10/04/2014  . SDH (subdural hematoma) ([place]) 09/06/2014  . Coronary atherosclerosis of native coronary artery  05/11/2014  . Obesity (BMI 30.0-34.9) 04/04/2014  . Seizure ([place]) 04/02/2014  . Syncope 04/02/2014  . SAH (subarachnoid hemorrhage) ([place]) 04/01/2014  . OSA (obstructive sleep apnea) 07/22/2013  . Hypertensive heart disease   . History of kidney stones   . Diabetes mellitus type 2, noninsulin dependent ([place])   . Hypothyroidism 05/07/2007    Bjorn Loser, PTA  24-Dec-2015, 11:54 AM [facility] [address] [place], [place], [address] Phone: [phone]   Fax:  [phone]   G code completed by Guido Sander, PT    Name: Allen Myers MRN: [id number] Date of Birth: 08-06-1946

## 2015-12-12 ENCOUNTER — Encounter: Payer: Self-pay | Admitting: Physical Therapy

## 2015-12-12 ENCOUNTER — Encounter (HOSPITAL_COMMUNITY)
Admission: RE | Admit: 2015-12-12 | Discharge: 2015-12-12 | Disposition: A | Discharge status: Home / Self Care | Payer: Self-pay | Source: Ambulatory Visit | Attending: Cardiology | Admitting: Cardiology

## 2015-12-12 NOTE — Therapy (Signed)
[facility] [address] [place], [place], [address] Phone: [phone]   Fax:  [phone]  Patient Details  Name: Allen Myers MRN: [id number] Date of Birth: [birth year] Referring Provider:  No ref. provider found  Encounter Date: 12/12/2015  PHYSICAL THERAPY DISCHARGE SUMMARY  Visits from Start of Care: 7  Current functional level related to goals / functional outcomes:     PT Long Term Goals - 12/11/15 1022    PT LONG TERM GOAL #1   Title Pt will be independent with HEP to address balance and dynamic gait.  TARGET 12/15/15   Baseline Reports Independence with HEP understanding.   Time 4   Period Weeks   Status Achieved   PT LONG TERM GOAL #2   Title Pt will improve Dynamic Gait Index score to at least 21/24 for decreased fall risk.   Baseline 24/24   Time 4   Period Weeks   Status Achieved   PT LONG TERM GOAL #3   Title Pt will report improvement on ABC scale score by at least 10% for improved confidence with functional activities.   Status Achieved   PT LONG TERM GOAL #4   Title Pt will perform floor>stand transfer modified independently, for safe fall recovery.   Baseline met   Time 4   Period Weeks   Status Achieved   PT LONG TERM GOAL #5   Title Pt will verbalize understanding of fall prevention within the home environment.   Baseline Verbalized understanding.   Time 4   Period Weeks   Status Achieved   PT LONG TERM GOAL #6   Title Pt will negotiate at least 4 steps with no rails, carrying objects, modified independent, for safe stair negotiation.   Time 4   Period Weeks   Status Achieved    Pt has met all long term goals, reports increased awareness of what he can and cannot do.   Remaining deficits: High level balance   Education / Equipment: HEP, fall prevention, with pt verbalizing/demonstrating understanding.  Plan: Patient agrees to discharge.  Patient goals were met. Patient is being  discharged due to meeting the stated rehab goals.  ?????      Kasee Hantz W. 12/12/2015, 1:12 PM  Frazier Butt., PT [facility] [address] [place], [place], [address] Phone: [phone]   Fax:  [phone]

## 2015-12-13 ENCOUNTER — Ambulatory Visit: Payer: Medicare Other | Admitting: Physical Therapy

## 2015-12-14 ENCOUNTER — Encounter (HOSPITAL_COMMUNITY): Payer: Self-pay

## 2015-12-14 DIAGNOSIS — I2510 Atherosclerotic heart disease of native coronary artery without angina pectoris: Secondary | ICD-10-CM | POA: Insufficient documentation

## 2015-12-14 DIAGNOSIS — I420 Dilated cardiomyopathy: Secondary | ICD-10-CM | POA: Insufficient documentation

## 2015-12-18 ENCOUNTER — Encounter (HOSPITAL_COMMUNITY): Payer: Self-pay

## 2015-12-19 ENCOUNTER — Encounter (HOSPITAL_COMMUNITY): Payer: Self-pay

## 2015-12-21 ENCOUNTER — Encounter (HOSPITAL_COMMUNITY): Payer: Self-pay

## 2015-12-25 ENCOUNTER — Encounter (HOSPITAL_COMMUNITY)
Admission: RE | Admit: 2015-12-25 | Discharge: 2015-12-25 | Disposition: A | Discharge status: Home / Self Care | Payer: Self-pay | Source: Ambulatory Visit | Attending: Cardiology | Admitting: Cardiology

## 2015-12-26 ENCOUNTER — Encounter (HOSPITAL_COMMUNITY): Payer: Self-pay

## 2015-12-28 ENCOUNTER — Encounter (HOSPITAL_COMMUNITY)
Admission: RE | Admit: 2015-12-28 | Discharge: 2015-12-28 | Disposition: A | Discharge status: Home / Self Care | Payer: Self-pay | Source: Ambulatory Visit | Attending: Cardiology | Admitting: Cardiology

## 2015-12-29 ENCOUNTER — Other Ambulatory Visit: Payer: Self-pay | Admitting: Cardiology

## 2015-12-29 ENCOUNTER — Other Ambulatory Visit: Payer: Self-pay | Admitting: Internal Medicine

## 2015-12-29 ENCOUNTER — Encounter (HOSPITAL_COMMUNITY)
Admission: RE | Admit: 2015-12-29 | Discharge: 2015-12-29 | Disposition: A | Discharge status: Home / Self Care | Payer: Self-pay | Source: Ambulatory Visit | Attending: Cardiology | Admitting: Cardiology

## 2016-01-01 ENCOUNTER — Other Ambulatory Visit: Payer: Self-pay | Admitting: Internal Medicine

## 2016-01-01 ENCOUNTER — Encounter (HOSPITAL_COMMUNITY): Payer: Self-pay

## 2016-01-01 ENCOUNTER — Other Ambulatory Visit: Payer: Self-pay

## 2016-01-01 ENCOUNTER — Telehealth: Payer: Self-pay

## 2016-01-01 MED ORDER — METFORMIN HCL 1000 MG PO TABS: 1000.0000 mg | ORAL_TABLET | Freq: Two times a day (BID) | ORAL | Status: DC

## 2016-01-01 MED ORDER — ATORVASTATIN CALCIUM 80 MG PO TABS: 80.0000 mg | ORAL_TABLET | Freq: Every day | ORAL | Status: DC

## 2016-01-01 MED ORDER — LEVETIRACETAM 1000 MG PO TABS: 1000.0000 mg | ORAL_TABLET | Freq: Two times a day (BID) | ORAL | Status: DC

## 2016-01-01 MED ORDER — LEVOTHYROXINE SODIUM 137 MCG PO CAPS: 137.0000 ug | ORAL_CAPSULE | Freq: Every day | ORAL | Status: DC

## 2016-01-01 MED ORDER — CARVEDILOL 3.125 MG PO TABS: 3.1250 mg | ORAL_TABLET | Freq: Two times a day (BID) | ORAL | Status: DC

## 2016-01-01 MED ORDER — LISINOPRIL 2.5 MG PO TABS: 2.5000 mg | ORAL_TABLET | Freq: Every day | ORAL | Status: DC

## 2016-01-01 NOTE — Telephone Encounter (Signed)
Lisinopril 2.5mg  refilled by PCP 01/01/16

## 2016-01-01 NOTE — Telephone Encounter (Signed)
Pt came into the office today. He wanted to verify and confirm his meds. Pts med list has been cleaned up and correct med dosage has been sent to pts new pharmacy.   Pt would like to go back on the [facility]? Please advise.  Med is no longer on his med list and I was not able to send in a refill.

## 2016-01-02 ENCOUNTER — Encounter (HOSPITAL_COMMUNITY)
Admission: RE | Admit: 2016-01-02 | Discharge: 2016-01-02 | Disposition: A | Discharge status: Home / Self Care | Payer: Self-pay | Source: Ambulatory Visit | Attending: Cardiology | Admitting: Cardiology

## 2016-01-02 NOTE — Telephone Encounter (Signed)
No need for this if he is taking the [place]

## 2016-01-03 MED ORDER — LINAGLIPTIN 5 MG PO TABS: 5.0000 mg | ORAL_TABLET | Freq: Every day | ORAL | Status: DC

## 2016-01-03 NOTE — Telephone Encounter (Signed)
He does not want to take the [place] anymore, he would prefer the [place].

## 2016-01-03 NOTE — Telephone Encounter (Signed)
Reserve for the tradjenta - done erx

## 2016-01-04 ENCOUNTER — Encounter (HOSPITAL_COMMUNITY)
Admission: RE | Admit: 2016-01-04 | Discharge: 2016-01-04 | Disposition: A | Discharge status: Home / Self Care | Payer: Self-pay | Source: Ambulatory Visit | Attending: Cardiology | Admitting: Cardiology

## 2016-01-04 NOTE — Telephone Encounter (Signed)
Pt informed of script being sent to the pharmacy. (sarah self, [employer] student  documented this record)

## 2016-01-05 ENCOUNTER — Encounter (HOSPITAL_COMMUNITY)
Admission: RE | Admit: 2016-01-05 | Discharge: 2016-01-05 | Disposition: A | Discharge status: Home / Self Care | Payer: Self-pay | Source: Ambulatory Visit | Attending: Cardiology | Admitting: Cardiology

## 2016-01-08 ENCOUNTER — Encounter (HOSPITAL_COMMUNITY): Payer: Self-pay

## 2016-01-09 ENCOUNTER — Encounter (HOSPITAL_COMMUNITY): Payer: Self-pay

## 2016-01-11 ENCOUNTER — Encounter (HOSPITAL_COMMUNITY)
Admission: RE | Admit: 2016-01-11 | Discharge: 2016-01-11 | Disposition: A | Discharge status: Home / Self Care | Payer: Self-pay | Source: Ambulatory Visit | Attending: Cardiology | Admitting: Cardiology

## 2016-01-12 ENCOUNTER — Encounter (HOSPITAL_COMMUNITY)
Admission: RE | Admit: 2016-01-12 | Discharge: 2016-01-12 | Disposition: A | Discharge status: Home / Self Care | Payer: Self-pay | Source: Ambulatory Visit | Attending: Cardiology | Admitting: Cardiology

## 2016-01-15 ENCOUNTER — Encounter (HOSPITAL_COMMUNITY): Payer: Self-pay

## 2016-01-15 DIAGNOSIS — I2510 Atherosclerotic heart disease of native coronary artery without angina pectoris: Secondary | ICD-10-CM | POA: Insufficient documentation

## 2016-01-15 DIAGNOSIS — I420 Dilated cardiomyopathy: Secondary | ICD-10-CM | POA: Insufficient documentation

## 2016-01-16 ENCOUNTER — Other Ambulatory Visit: Payer: Self-pay

## 2016-01-16 ENCOUNTER — Emergency Department (HOSPITAL_COMMUNITY): Payer: Medicare Other

## 2016-01-16 ENCOUNTER — Encounter (HOSPITAL_COMMUNITY): Payer: Self-pay | Admitting: Emergency Medicine

## 2016-01-16 ENCOUNTER — Emergency Department (HOSPITAL_COMMUNITY)
Admission: EM | Admit: 2016-01-16 | Discharge: 2016-01-16 | Disposition: A | Discharge status: Home / Self Care | Payer: Medicare Other | Attending: Emergency Medicine | Admitting: Emergency Medicine

## 2016-01-16 ENCOUNTER — Encounter (HOSPITAL_COMMUNITY)
Admission: RE | Admit: 2016-01-16 | Discharge: 2016-01-16 | Disposition: A | Discharge status: Home / Self Care | Payer: Self-pay | Source: Ambulatory Visit | Attending: Cardiology | Admitting: Cardiology

## 2016-01-16 DIAGNOSIS — F329 Major depressive disorder, single episode, unspecified: Secondary | ICD-10-CM | POA: Diagnosis not present

## 2016-01-16 DIAGNOSIS — Z79899 Other long term (current) drug therapy: Secondary | ICD-10-CM | POA: Insufficient documentation

## 2016-01-16 DIAGNOSIS — S8992XA Unspecified injury of left lower leg, initial encounter: Secondary | ICD-10-CM | POA: Diagnosis not present

## 2016-01-16 DIAGNOSIS — Y92410 Unspecified street and highway as the place of occurrence of the external cause: Secondary | ICD-10-CM | POA: Insufficient documentation

## 2016-01-16 DIAGNOSIS — I10 Essential (primary) hypertension: Secondary | ICD-10-CM | POA: Insufficient documentation

## 2016-01-16 DIAGNOSIS — Z7982 Long term (current) use of aspirin: Secondary | ICD-10-CM | POA: Insufficient documentation

## 2016-01-16 DIAGNOSIS — S8002XA Contusion of left knee, initial encounter: Secondary | ICD-10-CM | POA: Insufficient documentation

## 2016-01-16 DIAGNOSIS — S3993XA Unspecified injury of pelvis, initial encounter: Secondary | ICD-10-CM | POA: Diagnosis not present

## 2016-01-16 DIAGNOSIS — S3991XA Unspecified injury of abdomen, initial encounter: Secondary | ICD-10-CM | POA: Diagnosis not present

## 2016-01-16 DIAGNOSIS — E039 Hypothyroidism, unspecified: Secondary | ICD-10-CM | POA: Diagnosis not present

## 2016-01-16 DIAGNOSIS — Z87442 Personal history of urinary calculi: Secondary | ICD-10-CM | POA: Diagnosis not present

## 2016-01-16 DIAGNOSIS — S299XXA Unspecified injury of thorax, initial encounter: Secondary | ICD-10-CM | POA: Insufficient documentation

## 2016-01-16 DIAGNOSIS — Z86010 Personal history of colonic polyps: Secondary | ICD-10-CM | POA: Insufficient documentation

## 2016-01-16 DIAGNOSIS — Z87891 Personal history of nicotine dependence: Secondary | ICD-10-CM | POA: Diagnosis not present

## 2016-01-16 DIAGNOSIS — S279XXA Injury of unspecified intrathoracic organ, initial encounter: Secondary | ICD-10-CM | POA: Diagnosis not present

## 2016-01-16 DIAGNOSIS — Z7984 Long term (current) use of oral hypoglycemic drugs: Secondary | ICD-10-CM | POA: Diagnosis not present

## 2016-01-16 DIAGNOSIS — E785 Hyperlipidemia, unspecified: Secondary | ICD-10-CM | POA: Diagnosis not present

## 2016-01-16 DIAGNOSIS — Z8669 Personal history of other diseases of the nervous system and sense organs: Secondary | ICD-10-CM | POA: Insufficient documentation

## 2016-01-16 DIAGNOSIS — Z8719 Personal history of other diseases of the digestive system: Secondary | ICD-10-CM | POA: Diagnosis not present

## 2016-01-16 DIAGNOSIS — Y998 Other external cause status: Secondary | ICD-10-CM | POA: Diagnosis not present

## 2016-01-16 DIAGNOSIS — S60511A Abrasion of right hand, initial encounter: Secondary | ICD-10-CM | POA: Diagnosis not present

## 2016-01-16 DIAGNOSIS — Y9389 Activity, other specified: Secondary | ICD-10-CM | POA: Diagnosis not present

## 2016-01-16 DIAGNOSIS — Z87438 Personal history of other diseases of male genital organs: Secondary | ICD-10-CM | POA: Insufficient documentation

## 2016-01-16 DIAGNOSIS — E119 Type 2 diabetes mellitus without complications: Secondary | ICD-10-CM | POA: Diagnosis not present

## 2016-01-16 DIAGNOSIS — Z9889 Other specified postprocedural states: Secondary | ICD-10-CM | POA: Diagnosis not present

## 2016-01-16 DIAGNOSIS — R0781 Pleurodynia: Secondary | ICD-10-CM | POA: Diagnosis not present

## 2016-01-16 DIAGNOSIS — S40012A Contusion of left shoulder, initial encounter: Secondary | ICD-10-CM | POA: Insufficient documentation

## 2016-01-16 DIAGNOSIS — R0789 Other chest pain: Secondary | ICD-10-CM

## 2016-01-16 DIAGNOSIS — M25562 Pain in left knee: Secondary | ICD-10-CM | POA: Diagnosis not present

## 2016-01-16 DIAGNOSIS — R079 Chest pain, unspecified: Secondary | ICD-10-CM | POA: Diagnosis not present

## 2016-01-16 LAB — CBC WITH DIFFERENTIAL/PLATELET
Basophils Absolute: 0 10*3/uL (ref 0.0–0.1)
Basophils Relative: 0 %
Eosinophils Absolute: 0.2 10*3/uL (ref 0.0–0.7)
Eosinophils Relative: 2 %
HCT: 42.1 % (ref 39.0–52.0)
Hemoglobin: 13.6 g/dL (ref 13.0–17.0)
Lymphocytes Relative: 17 %
Lymphs Abs: 1.8 10*3/uL (ref 0.7–4.0)
MCH: 28.6 pg (ref 26.0–34.0)
MCHC: 32.3 g/dL (ref 30.0–36.0)
MCV: 88.6 fL (ref 78.0–100.0)
Monocytes Absolute: 0.8 10*3/uL (ref 0.1–1.0)
Monocytes Relative: 7 %
Neutro Abs: 7.8 10*3/uL — ABNORMAL HIGH (ref 1.7–7.7)
Neutrophils Relative %: 74 %
Platelets: 127 10*3/uL — ABNORMAL LOW (ref 150–400)
RBC: 4.75 MIL/uL (ref 4.22–5.81)
RDW: 14.8 % (ref 11.5–15.5)
WBC: 10.5 10*3/uL (ref 4.0–10.5)

## 2016-01-16 LAB — I-STAT TROPONIN, ED
Troponin i, poc: 0 ng/mL (ref 0.00–0.08)
Troponin i, poc: 0 ng/mL (ref 0.00–0.08)

## 2016-01-16 LAB — I-STAT CHEM 8, ED
BUN: 21 mg/dL — ABNORMAL HIGH (ref 6–20)
Calcium, Ion: 1.17 mmol/L (ref 1.13–1.30)
Chloride: 102 mmol/L (ref 101–111)
Creatinine, Ser: 1 mg/dL (ref 0.61–1.24)
Glucose, Bld: 125 mg/dL — ABNORMAL HIGH (ref 65–99)
HCT: 45 % (ref 39.0–52.0)
Hemoglobin: 15.3 g/dL (ref 13.0–17.0)
Potassium: 5 mmol/L (ref 3.5–5.1)
Sodium: 139 mmol/L (ref 135–145)
TCO2: 26 mmol/L (ref 0–100)

## 2016-01-16 MED ORDER — METHOCARBAMOL 500 MG PO TABS: 500.0000 mg | ORAL_TABLET | Freq: Two times a day (BID) | ORAL | Status: DC

## 2016-01-16 MED ORDER — HYDROCODONE-ACETAMINOPHEN 5-325 MG PO TABS
1.0000 | ORAL_TABLET | Freq: Once | ORAL | Status: AC
  Administered 2016-01-16: 2 via ORAL
  Filled 2016-01-16: qty 2

## 2016-01-16 MED ORDER — SODIUM CHLORIDE 0.9 % IV BOLUS (SEPSIS)
1000.0000 mL | Freq: Once | INTRAVENOUS | Status: AC
  Administered 2016-01-16: 1000 mL via INTRAVENOUS

## 2016-01-16 MED ORDER — GI COCKTAIL ~~LOC~~
30.0000 mL | Freq: Once | ORAL | Status: AC
  Administered 2016-01-16: 30 mL via ORAL
  Filled 2016-01-16: qty 30

## 2016-01-16 MED ORDER — ACETAMINOPHEN 500 MG PO TABS: 1000.0000 mg | ORAL_TABLET | Freq: Once | ORAL | Status: DC

## 2016-01-16 MED ORDER — HYDROCODONE-ACETAMINOPHEN 5-325 MG PO TABS: 1.0000 | ORAL_TABLET | ORAL | Status: DC | PRN

## 2016-01-16 MED ORDER — IOPAMIDOL (ISOVUE-300) INJECTION 61%
INTRAVENOUS | Status: AC
  Administered 2016-01-16: 100 mL
  Filled 2016-01-16: qty 100

## 2016-01-16 NOTE — ED Notes (Signed)
Pt complaining of dizziness when changing positions (Laying, Sitting, Standing) and states he is very "sore".
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
## 2016-01-16 NOTE — ED Notes (Signed)
Pt stable, ambulatory, states understanding of discharge instructions 

## 2016-01-17 ENCOUNTER — Encounter: Payer: Self-pay | Admitting: Internal Medicine

## 2016-01-17 DIAGNOSIS — R9389 Abnormal findings on diagnostic imaging of other specified body structures: Secondary | ICD-10-CM

## 2016-01-18 ENCOUNTER — Encounter: Payer: Self-pay | Admitting: Internal Medicine

## 2016-01-18 ENCOUNTER — Encounter (HOSPITAL_COMMUNITY): Payer: Self-pay

## 2016-01-22 ENCOUNTER — Encounter (HOSPITAL_COMMUNITY): Payer: Self-pay

## 2016-01-22 NOTE — Telephone Encounter (Signed)
Dr Henrene Pastor  -   More hx indicates a recent imaging study with thickened esophagus, cant r/o malignancy  Can the pt be seen sooner than June 1?  Thanks Cathlean Cower

## 2016-01-23 ENCOUNTER — Encounter (HOSPITAL_COMMUNITY): Payer: Self-pay

## 2016-01-23 NOTE — Telephone Encounter (Signed)
Pt scheduled for previsit 01/31/16@10am , EGD scheduled in the [facility] 02/02/16@11 :30am. Left message for pt to call back.

## 2016-01-23 NOTE — Telephone Encounter (Signed)
Vaughan Basta,     I reviewed this patient's chart after receiving the message below from Dr. Jenny Reichmann. Let's set the patient up for direct EGD in [place] "abnormal CT of the esophagus". We can forgo the office visit to move things along for this gentleman. Covert all to phone note so I might refer back when I see him. Thanks     Dr. Henrene Pastor

## 2016-01-24 NOTE — Telephone Encounter (Signed)
Spoke with pt and he is aware of appts. 

## 2016-01-25 ENCOUNTER — Encounter (HOSPITAL_COMMUNITY): Payer: Self-pay

## 2016-01-29 ENCOUNTER — Encounter (HOSPITAL_COMMUNITY): Payer: Self-pay

## 2016-01-30 ENCOUNTER — Encounter (HOSPITAL_COMMUNITY): Payer: Self-pay

## 2016-01-31 ENCOUNTER — Ambulatory Visit (AMBULATORY_SURGERY_CENTER): Payer: Self-pay | Admitting: *Deleted

## 2016-01-31 VITALS — Ht 73.0 in | Wt 248.8 lb

## 2016-01-31 DIAGNOSIS — K228 Other specified diseases of esophagus: Secondary | ICD-10-CM

## 2016-01-31 DIAGNOSIS — K2289 Other specified disease of esophagus: Secondary | ICD-10-CM

## 2016-01-31 NOTE — Progress Notes (Signed)
Patient denies any allergies to egg or soy products. Patient denies complications with anesthesia/sedation.  Patient denies oxygen use at home and denies diet medications. Emmi instructions for endoscopy but patient denied.  Brochure given to patient.

## 2016-02-01 ENCOUNTER — Encounter (HOSPITAL_COMMUNITY): Payer: Self-pay

## 2016-02-02 ENCOUNTER — Encounter: Payer: Self-pay | Admitting: Internal Medicine

## 2016-02-02 ENCOUNTER — Ambulatory Visit (AMBULATORY_SURGERY_CENTER): Payer: Medicare Other | Admitting: Internal Medicine

## 2016-02-02 VITALS — BP 108/64 | HR 62 | Temp 102.0°F | Resp 12 | Ht 73.0 in | Wt 248.0 lb

## 2016-02-02 DIAGNOSIS — K222 Esophageal obstruction: Secondary | ICD-10-CM

## 2016-02-02 DIAGNOSIS — K2100 Gastro-esophageal reflux disease with esophagitis, without bleeding: Secondary | ICD-10-CM

## 2016-02-02 DIAGNOSIS — E119 Type 2 diabetes mellitus without complications: Secondary | ICD-10-CM | POA: Diagnosis not present

## 2016-02-02 DIAGNOSIS — K317 Polyp of stomach and duodenum: Secondary | ICD-10-CM | POA: Diagnosis not present

## 2016-02-02 DIAGNOSIS — K2289 Other specified disease of esophagus: Secondary | ICD-10-CM

## 2016-02-02 DIAGNOSIS — G4733 Obstructive sleep apnea (adult) (pediatric): Secondary | ICD-10-CM | POA: Diagnosis not present

## 2016-02-02 DIAGNOSIS — K228 Other specified diseases of esophagus: Secondary | ICD-10-CM

## 2016-02-02 DIAGNOSIS — I10 Essential (primary) hypertension: Secondary | ICD-10-CM | POA: Diagnosis not present

## 2016-02-02 DIAGNOSIS — R933 Abnormal findings on diagnostic imaging of other parts of digestive tract: Secondary | ICD-10-CM

## 2016-02-02 LAB — GLUCOSE, CAPILLARY
GLUCOSE-CAPILLARY: 102 mg/dL — AB (ref 65–99)
Glucose-Capillary: 90 mg/dL (ref 65–99)

## 2016-02-02 MED ORDER — SODIUM CHLORIDE 0.9 % IV SOLN: 500.0000 mL | INTRAVENOUS | Status: DC

## 2016-02-02 MED ORDER — OMEPRAZOLE 40 MG PO CPDR: 40.0000 mg | DELAYED_RELEASE_CAPSULE | Freq: Every day | ORAL | Status: DC
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
## 2016-02-02 NOTE — Patient Instructions (Addendum)
YOU HAD AN ENDOSCOPIC PROCEDURE TODAY AT [facility]:   Refer to the procedure report that was given to you for any specific questions about what was found during the examination.  If the procedure report does not answer your questions, please call your gastroenterologist to clarify.  If you requested that your care partner not be given the details of your procedure findings, then the procedure report has been included in a sealed envelope for you to review at your convenience later.  YOU SHOULD EXPECT: Some feelings of bloating in the abdomen. Passage of more gas than usual.  Walking can help get rid of the air that was put into your GI tract during the procedure and reduce the bloating. If you had a lower endoscopy (such as a colonoscopy or flexible sigmoidoscopy) you may notice spotting of blood in your stool or on the toilet paper. If you underwent a bowel prep for your procedure, you may not have a normal bowel movement for a few days.  Please Note:  You might notice some irritation and congestion in your nose or some drainage.  This is from the oxygen used during your procedure.  There is no need for concern and it should clear up in a day or so.  SYMPTOMS TO REPORT IMMEDIATELY:   Following upper endoscopy (EGD)  Vomiting of blood or coffee ground material  New chest pain or pain under the shoulder blades  Painful or persistently difficult swallowing  New shortness of breath  Fever of 100F or higher  Black, tarry-looking stools  For urgent or emergent issues, a gastroenterologist can be reached at any hour by calling [phone].   DIET: Your first meal following the procedure should be a small meal and then it is ok to progress to your normal diet. Heavy or fried foods are harder to digest and may make you feel nauseous or bloated.  Likewise, meals heavy in dairy and vegetables can increase bloating.  Drink plenty of fluids but you should avoid alcoholic beverages for  24 hours.  ACTIVITY:  You should plan to take it easy for the rest of today and you should NOT DRIVE or use heavy machinery until tomorrow (because of the sedation medicines used during the test).    FOLLOW UP: Our staff will call the number listed on your records the next business day following your procedure to check on you and address any questions or concerns that you may have regarding the information given to you following your procedure. If we do not reach you, we will leave a message.  However, if you are feeling well and you are not experiencing any problems, there is no need to return our call.  We will assume that you have returned to your regular daily activities without incident.  If any biopsies were taken you will be contacted by phone or by letter within the next 1-3 weeks.  Please call us at [phone] if you have not heard about the biopsies in 3 weeks.    SIGNATURES/CONFIDENTIALITY: You and/or your care partner have signed paperwork which will be entered into your electronic medical record.  These signatures attest to the fact that that the information above on your After Visit Summary has been reviewed and is understood.  Full responsibility of the confidentiality of this discharge information lies with you and/or your care-partner.  Dilation diet instructions given and handout provided. Prilosec 40 mg daily. Dilation diet and stricture handouts provided. 8 week follow  up appointment.

## 2016-02-02 NOTE — Progress Notes (Signed)
  [facility] Post-op Note  Patient: Allen Myers  Procedure(s) Performed: endoscopy with dilatation  Patient Location: [facility] - Recovery Area  Anesthesia Type: Deep Sedation/Propofol  Level of Consciousness: awake, oriented and patient cooperative  Airway and Oxygen Therapy: Patient Spontanous Breathing  Post-op Pain: none  Post-op Assessment:  Post-op Vital signs reviewed, Patient's Cardiovascular Status Stable, Respiratory Function Stable, Patent Airway, No signs of Nausea or vomiting and Pain level controlled  Post-op Vital Signs: Reviewed and stable  Complications: No apparent anesthesia complications  Kassadi Presswood E 12:29 PM

## 2016-02-05 ENCOUNTER — Encounter (HOSPITAL_COMMUNITY): Admission: RE | Admit: 2016-02-05 | Payer: Self-pay | Source: Ambulatory Visit

## 2016-02-05 ENCOUNTER — Telehealth: Payer: Self-pay | Admitting: *Deleted

## 2016-02-05 NOTE — Telephone Encounter (Signed)
  Follow up Call-  Call back number 02/02/2016  Post procedure Call Back phone  # [phone]  Permission to leave phone message Yes     Patient questions:  Do you have a fever, pain , or abdominal swelling? No. Pain Score  0 *  Have you tolerated food without any problems? Yes.    Have you been able to return to your normal activities? Yes.    Do you have any questions about your discharge instructions: Diet   No. Medications  No. Follow up visit  No.  Do you have questions or concerns about your Care? No.  Actions: * If pain score is 4 or above: No action needed, pain <4.

## 2016-02-06 ENCOUNTER — Encounter (HOSPITAL_COMMUNITY): Payer: Self-pay

## 2016-02-07 ENCOUNTER — Encounter: Payer: Self-pay | Admitting: Internal Medicine

## 2016-02-08 ENCOUNTER — Encounter: Payer: Self-pay | Admitting: Cardiology

## 2016-02-08 ENCOUNTER — Ambulatory Visit (INDEPENDENT_AMBULATORY_CARE_PROVIDER_SITE_OTHER): Payer: Medicare Other | Admitting: Cardiology

## 2016-02-08 ENCOUNTER — Encounter (HOSPITAL_COMMUNITY): Payer: Self-pay

## 2016-02-08 VITALS — BP 104/70 | HR 71 | Ht 73.0 in | Wt 247.0 lb

## 2016-02-08 DIAGNOSIS — E785 Hyperlipidemia, unspecified: Secondary | ICD-10-CM | POA: Diagnosis not present

## 2016-02-08 DIAGNOSIS — I2510 Atherosclerotic heart disease of native coronary artery without angina pectoris: Secondary | ICD-10-CM | POA: Diagnosis not present

## 2016-02-08 DIAGNOSIS — R55 Syncope and collapse: Secondary | ICD-10-CM

## 2016-02-08 DIAGNOSIS — I10 Essential (primary) hypertension: Secondary | ICD-10-CM | POA: Diagnosis not present

## 2016-02-08 DIAGNOSIS — I420 Dilated cardiomyopathy: Secondary | ICD-10-CM

## 2016-02-08 NOTE — Patient Instructions (Signed)
Medication Instructions:   NO CHANGE  Testing/Procedures:  Your physician has requested that you have an echocardiogram. Echocardiography is a painless test that uses sound waves to create images of your heart. It provides your doctor with information about the size and shape of your heart and how well your heart's chambers and valves are working. This procedure takes approximately one hour. There are no restrictions for this procedure.    Follow-Up:  Your physician recommends that you schedule a follow-up appointment in: 3 MONTHS WITH DR CRENSHAW      
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
## 2016-02-08 NOTE — Assessment & Plan Note (Signed)
Continue aspirin and statin. 

## 2016-02-08 NOTE — Assessment & Plan Note (Signed)
LV function improved on most recent echo.Continue present medications.

## 2016-02-08 NOTE — Assessment & Plan Note (Addendum)
Patient had a recent syncopal episode.This was likely secondary to esophageal spasm followed by a vagal episode. We will plan to repeat echocardiogram. I have instructed him not to drive for 6 months from previous syncopal episode. Patient may return to cardiac rehabilitation.

## 2016-02-08 NOTE — Assessment & Plan Note (Signed)
Continue statin. 

## 2016-02-08 NOTE — Assessment & Plan Note (Signed)
But pressure controlled. Continue present medications.

## 2016-02-08 NOTE — Assessment & Plan Note (Signed)
Plan repeat abdominal ultrasoundApril 2018.

## 2016-02-12 ENCOUNTER — Encounter (HOSPITAL_COMMUNITY): Payer: Self-pay

## 2016-02-12 DIAGNOSIS — I420 Dilated cardiomyopathy: Secondary | ICD-10-CM | POA: Insufficient documentation

## 2016-02-12 DIAGNOSIS — I2510 Atherosclerotic heart disease of native coronary artery without angina pectoris: Secondary | ICD-10-CM | POA: Insufficient documentation

## 2016-02-13 ENCOUNTER — Encounter (HOSPITAL_COMMUNITY)
Admission: RE | Admit: 2016-02-13 | Discharge: 2016-02-13 | Disposition: A | Discharge status: Home / Self Care | Payer: Self-pay | Source: Ambulatory Visit | Attending: Cardiology | Admitting: Cardiology

## 2016-02-15 ENCOUNTER — Encounter (HOSPITAL_COMMUNITY): Payer: Self-pay

## 2016-02-19 ENCOUNTER — Encounter (HOSPITAL_COMMUNITY): Payer: Self-pay

## 2016-02-20 ENCOUNTER — Encounter (HOSPITAL_COMMUNITY): Payer: Self-pay

## 2016-02-21 ENCOUNTER — Encounter (HOSPITAL_COMMUNITY)
Admission: RE | Admit: 2016-02-21 | Discharge: 2016-02-21 | Disposition: A | Discharge status: Home / Self Care | Payer: Self-pay | Source: Ambulatory Visit | Attending: Cardiology | Admitting: Cardiology

## 2016-02-22 ENCOUNTER — Encounter (HOSPITAL_COMMUNITY): Payer: Self-pay

## 2016-02-23 ENCOUNTER — Ambulatory Visit (HOSPITAL_COMMUNITY): Payer: Medicare Other | Attending: Cardiovascular Disease

## 2016-02-23 ENCOUNTER — Telehealth: Payer: Self-pay | Admitting: *Deleted

## 2016-02-23 ENCOUNTER — Other Ambulatory Visit: Payer: Self-pay

## 2016-02-23 DIAGNOSIS — G4730 Sleep apnea, unspecified: Secondary | ICD-10-CM | POA: Insufficient documentation

## 2016-02-23 DIAGNOSIS — R55 Syncope and collapse: Secondary | ICD-10-CM

## 2016-02-23 DIAGNOSIS — I119 Hypertensive heart disease without heart failure: Secondary | ICD-10-CM | POA: Insufficient documentation

## 2016-02-23 DIAGNOSIS — E785 Hyperlipidemia, unspecified: Secondary | ICD-10-CM | POA: Diagnosis not present

## 2016-02-23 DIAGNOSIS — E119 Type 2 diabetes mellitus without complications: Secondary | ICD-10-CM | POA: Insufficient documentation

## 2016-02-23 NOTE — Telephone Encounter (Signed)
Left message to call back  -in regards to echo report Release to my chart

## 2016-02-23 NOTE — Telephone Encounter (Signed)
-----   Message from Lelon Perla, MD sent at 02/23/2016  2:49 PM EDT ----- [facility]

## 2016-02-26 ENCOUNTER — Encounter (HOSPITAL_COMMUNITY)
Admission: RE | Admit: 2016-02-26 | Discharge: 2016-02-26 | Disposition: A | Discharge status: Home / Self Care | Payer: Self-pay | Source: Ambulatory Visit | Attending: Cardiology | Admitting: Cardiology

## 2016-02-27 ENCOUNTER — Encounter (HOSPITAL_COMMUNITY)
Admission: RE | Admit: 2016-02-27 | Discharge: 2016-02-27 | Disposition: A | Discharge status: Home / Self Care | Payer: Self-pay | Source: Ambulatory Visit | Attending: Cardiology | Admitting: Cardiology

## 2016-02-29 ENCOUNTER — Encounter: Payer: Self-pay | Admitting: Family Medicine

## 2016-02-29 ENCOUNTER — Encounter (HOSPITAL_COMMUNITY)
Admission: RE | Admit: 2016-02-29 | Discharge: 2016-02-29 | Disposition: A | Discharge status: Home / Self Care | Payer: Self-pay | Source: Ambulatory Visit | Attending: Cardiology | Admitting: Cardiology

## 2016-02-29 NOTE — Telephone Encounter (Signed)
Pt returned call, upset unable to get through scheduling to correct Trixie Dredge, RN at [facility] office keeps getting blind transferred to [facility] x 2, pt asked if I would take message and just have her call him back on cell number regarding results.  Please call pt back, Thanks.

## 2016-02-29 NOTE — Telephone Encounter (Signed)
Spoke to patient. Result given . Verbalized understanding Apologize to patient -trouble in getting in touch with RN.

## 2016-03-04 ENCOUNTER — Encounter (HOSPITAL_COMMUNITY): Payer: Self-pay

## 2016-03-04 ENCOUNTER — Other Ambulatory Visit: Payer: Self-pay

## 2016-03-04 ENCOUNTER — Ambulatory Visit (INDEPENDENT_AMBULATORY_CARE_PROVIDER_SITE_OTHER): Payer: Medicare Other | Admitting: Family Medicine

## 2016-03-04 ENCOUNTER — Encounter: Payer: Self-pay | Admitting: Family Medicine

## 2016-03-04 VITALS — BP 102/64 | HR 65 | Ht 73.0 in | Wt 246.0 lb

## 2016-03-04 DIAGNOSIS — S51001A Unspecified open wound of right elbow, initial encounter: Secondary | ICD-10-CM | POA: Insufficient documentation

## 2016-03-04 DIAGNOSIS — M75101 Unspecified rotator cuff tear or rupture of right shoulder, not specified as traumatic: Secondary | ICD-10-CM | POA: Insufficient documentation

## 2016-03-04 DIAGNOSIS — I2510 Atherosclerotic heart disease of native coronary artery without angina pectoris: Secondary | ICD-10-CM

## 2016-03-04 DIAGNOSIS — M25511 Pain in right shoulder: Secondary | ICD-10-CM

## 2016-03-04 DIAGNOSIS — M7711 Lateral epicondylitis, right elbow: Secondary | ICD-10-CM

## 2016-03-04 DIAGNOSIS — S53104A Unspecified dislocation of right ulnohumeral joint, initial encounter: Secondary | ICD-10-CM

## 2016-03-04 DIAGNOSIS — IMO0002 Reserved for concepts with insufficient information to code with codable children: Secondary | ICD-10-CM | POA: Insufficient documentation

## 2016-03-04 NOTE — Assessment & Plan Note (Signed)
Small avulsion noted on ultrasound today. Patient does have a lateral epicondylitis. We discussed wrist brace which he was given work with athletic trainer to learn home exercises in greater detail. We discussed icing regimen. Discussed objective is to do in which ones to avoid. Follow up with me again in 3-4 weeks. If symptoms patient may need formal physical therapy.

## 2016-03-04 NOTE — Assessment & Plan Note (Signed)
Tolerated procedure well. We discussed icing regimen and home exercises. We discussed which activities to do in which ones to afford. Patient and will come back and see me again in 3-4 weeks for further evaluation and treatment.

## 2016-03-04 NOTE — Progress Notes (Signed)
Pre visit review using our clinic review tool, if applicable. No additional management support is needed unless otherwise documented below in the visit note. 
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
## 2016-03-04 NOTE — Patient Instructions (Signed)
Good to see you  You have 2 problems Did injection on the shoulder today  For the wrist wear the brace day and night for 1 week then nightly for 2 weeks.  Ice 20 minutes 2 times daily. Usually after activity and before bed. Exercises 3 times a week.  See me again in 4 weeks to make sure it is doing well.

## 2016-03-05 ENCOUNTER — Encounter (HOSPITAL_COMMUNITY)
Admission: RE | Admit: 2016-03-05 | Discharge: 2016-03-05 | Disposition: A | Discharge status: Home / Self Care | Payer: Self-pay | Source: Ambulatory Visit | Attending: Cardiology | Admitting: Cardiology

## 2016-03-07 ENCOUNTER — Encounter (HOSPITAL_COMMUNITY)
Admission: RE | Admit: 2016-03-07 | Discharge: 2016-03-07 | Disposition: A | Discharge status: Home / Self Care | Payer: Self-pay | Source: Ambulatory Visit | Attending: Cardiology | Admitting: Cardiology

## 2016-03-08 ENCOUNTER — Encounter (HOSPITAL_COMMUNITY)
Admission: RE | Admit: 2016-03-08 | Discharge: 2016-03-08 | Disposition: A | Discharge status: Home / Self Care | Payer: Self-pay | Source: Ambulatory Visit | Attending: Cardiology | Admitting: Cardiology

## 2016-03-12 ENCOUNTER — Encounter (HOSPITAL_COMMUNITY)
Admission: RE | Admit: 2016-03-12 | Discharge: 2016-03-12 | Disposition: A | Discharge status: Home / Self Care | Payer: Self-pay | Source: Ambulatory Visit | Attending: Cardiology | Admitting: Cardiology

## 2016-03-14 ENCOUNTER — Encounter (HOSPITAL_COMMUNITY)
Admission: RE | Admit: 2016-03-14 | Discharge: 2016-03-14 | Disposition: A | Discharge status: Home / Self Care | Payer: Self-pay | Source: Ambulatory Visit | Attending: Cardiology | Admitting: Cardiology

## 2016-03-14 ENCOUNTER — Ambulatory Visit: Payer: Medicare Other | Admitting: Internal Medicine

## 2016-03-14 DIAGNOSIS — I420 Dilated cardiomyopathy: Secondary | ICD-10-CM | POA: Insufficient documentation

## 2016-03-14 DIAGNOSIS — I2510 Atherosclerotic heart disease of native coronary artery without angina pectoris: Secondary | ICD-10-CM | POA: Insufficient documentation

## 2016-03-18 ENCOUNTER — Encounter (HOSPITAL_COMMUNITY): Payer: Self-pay

## 2016-03-19 ENCOUNTER — Encounter (HOSPITAL_COMMUNITY): Payer: Self-pay

## 2016-03-20 ENCOUNTER — Encounter (HOSPITAL_COMMUNITY)
Admission: RE | Admit: 2016-03-20 | Discharge: 2016-03-20 | Disposition: A | Discharge status: Home / Self Care | Payer: Self-pay | Source: Ambulatory Visit | Attending: Cardiology | Admitting: Cardiology

## 2016-03-21 ENCOUNTER — Encounter (HOSPITAL_COMMUNITY): Payer: Self-pay

## 2016-03-25 ENCOUNTER — Encounter (HOSPITAL_COMMUNITY): Payer: Self-pay

## 2016-03-26 ENCOUNTER — Encounter (HOSPITAL_COMMUNITY): Payer: Self-pay

## 2016-03-28 ENCOUNTER — Encounter (HOSPITAL_COMMUNITY): Payer: Self-pay

## 2016-03-29 ENCOUNTER — Encounter (HOSPITAL_COMMUNITY)
Admission: RE | Admit: 2016-03-29 | Discharge: 2016-03-29 | Disposition: A | Discharge status: Home / Self Care | Payer: Self-pay | Source: Ambulatory Visit | Attending: Cardiology | Admitting: Cardiology

## 2016-04-01 ENCOUNTER — Encounter: Payer: Self-pay | Admitting: Family Medicine

## 2016-04-01 ENCOUNTER — Encounter (HOSPITAL_COMMUNITY): Payer: Self-pay

## 2016-04-01 ENCOUNTER — Ambulatory Visit (INDEPENDENT_AMBULATORY_CARE_PROVIDER_SITE_OTHER): Payer: Medicare Other | Admitting: Family Medicine

## 2016-04-01 VITALS — BP 114/68 | HR 66 | Ht 73.0 in | Wt 243.0 lb

## 2016-04-01 DIAGNOSIS — I2510 Atherosclerotic heart disease of native coronary artery without angina pectoris: Secondary | ICD-10-CM

## 2016-04-01 DIAGNOSIS — M75101 Unspecified rotator cuff tear or rupture of right shoulder, not specified as traumatic: Secondary | ICD-10-CM

## 2016-04-01 DIAGNOSIS — M7711 Lateral epicondylitis, right elbow: Secondary | ICD-10-CM | POA: Diagnosis not present

## 2016-04-01 NOTE — Progress Notes (Signed)
Pre visit review using our clinic review tool, if applicable. No additional management support is needed unless otherwise documented below in the visit note. 

## 2016-04-01 NOTE — Assessment & Plan Note (Signed)
Overall doing relatively well. Continue conservative therapy. Can repeat injection every 3 months necessary. Follow-up at that time.

## 2016-04-01 NOTE — Assessment & Plan Note (Signed)
Healing well. Encourage patient to decrease the brace. Patient will start decreasing the rehabilitation exercises and start increasing regular strength exercises. Follow-up with me again as needed.

## 2016-04-01 NOTE — Progress Notes (Signed)
[name] [place], [place] [address] Phone: [phone] Subjective:     CC: Right shoulder pain, radiation down right arm f/u  [id number] Allen Myers is a 70 y.o. male coming in with complaint of right shoulder pain. Patient does have known rotator cuff arthropathy bilaterally. Patient given injection in the right side. An states that the pain overall has improved. Still unable to throw a ball. Continues to work out on a regular basis. Doing heavier weights with no significant increase in pain.  Patient was also found to have an avulsion fracture of the lateral epicondylar region. Patient has been wearing the brace, exercising, icing, and states that he is feeling 90% better. No longer having any swelling or point tenderness. Denies any radiation down the arm. Overall feeling much better.      Past Medical History  Diagnosis Date  . Hyperlipidemia   . History of colon polyps   . History of diverticulitis   . Erectile dysfunction   . HYPERTENSION   . SDH (subdural hematoma) ([facility])     Following syncopal episode  . SAH (subarachnoid hemorrhage) ([facility])     Following syncopal episode  . Kidney stones     "passed it"  . Sleep apnea in adult   . Hypothyroidism   . Depression   . Sleep apnea     "mild"  does not use CPAP  . Type II diabetes mellitus ([facility])     type 2  . Seizures ([facility])     08/2014 - controlled   Past Surgical History  Procedure Laterality Date  . Polypectomy    . Coronary angioplasty with stent placement  05/11/2014    "1"  . Craniotomy Left 09/12/2014    Procedure: CRANIOTOMY HEMATOMA EVACUATION SUBDURAL;  Surgeon: Erline Levine, MD;  Location: [facility];  Service: Neurosurgery;  Laterality: Left;  . Left heart catheterization with coronary angiogram N/A 05/11/2014    Procedure: LEFT HEART CATHETERIZATION WITH CORONARY ANGIOGRAM;  Surgeon: [name], MD;  Location[facility] C[facility] LAB;  Service: Cardiovascular;   Laterality: N/A;  . Fractional flow reserve wire  05/11/2014    Procedure: FRACTIONAL FLOW RESERVE WIRE;  Surgeon: [name], MD;  Location: [facility] CATH LAB;  Service: Cardiovascular;;  . Colonoscopy  2014    polyp   Social History  Substance Use Topics  . Smoking status: Former Smoker -- 1.00 packs/day for 30 years    Types: Cigarettes    Quit date: 10/15/1983  . Smokeless tobacco: Never Used  . Alcohol Use: No   No Known Allergies  Family History  Problem Relation Age of Onset  . Cancer Brother     prostate cancer  . Cancer Cousin     lung cancer  . Colon cancer Neg Hx   . Rectal cancer Neg Hx   . Stomach cancer Neg Hx   . Colon polyps Neg Hx   . Esophageal cancer Neg Hx   . Lung cancer Father     dad was a smoker  . Heart disease Father   . Heart disease Mother   . Ataxia Maternal Grandmother   . Dementia Maternal Grandmother      Past medical history, social, surgical and family history all reviewed in electronic medical record.   Review of Systems: No headache, visual changes, nausea, vomiting, diarrhea, constipation, dizziness, abdominal pain, skin rash, fevers, chills, night sweats, weight loss, swollen lymph nodes, body aches, joint  swelling, muscle aches, chest pain, shortness of breath, mood changes.   Objective Blood pressure 114/68, pulse 66, height 6\' 1"  (1.854 m), weight 243 lb (110.224 kg), SpO2 93 %.  General: No apparent distress alert and oriented x3 mood and affect normal, dressed appropriately.  HEENT: Pupils equal, extraocular movements intact  Respiratory: Patient's speak in full sentences and does not appear short of breath  Cardiovascular: No lower extremity edema, non tender, no erythema  Skin: Warm dry intact with no signs of infection or rash on extremities or on axial skeleton.  Abdomen: Soft nontender  Neuro: Cranial nerves II through XII are intact, neurovascularly intact in all extremities with 2+ DTRs and 2+ pulses.  Lymph: No  lymphadenopathy of posterior or anterior cervical chain or axillae bilaterally.  Gait normal with good balance and coordination.  MSK:  Non tender with full range of motion and good stability and symmetric strength and tone of  elbows, wrist, hip, knee and ankles bilaterally.  Neck: Inspection unremarkable. No palpable stepoffs. Negative Spurling's maneuver. Mild decrease in range of motion especially lacking the last 10 of extension as well as 10 side bending bilaterally about stable from patient's previous exam Grip strength and sensation normal in bilateral hands Strength good C4 to T1 distribution No sensory change to C4 to T1 Negative Hoffman sign bilaterally Reflexes normal Stable from previous exam  Shoulder: Right Inspection reveals no abnormalities, atrophy or asymmetry. Palpation is normal with no tenderness over AC joint or bicipital groove. Minimal impingement still remaining Rotator cuff strength normal throughout. Speeds and Yergason's tests normal. No labral pathology noted with negative Obrien's, negative clunk and good stability. Mild positive crossover sign. Normal scapular function observed. No painful arc and no drop arm sign. No apprehension sign Contralateral shoulder unremarkable  Elbow: Right Unremarkable to inspection. Range of motion full pronation, supination, flexion, extension. Strength is full to all of the above directions Stable to varus, valgus stress. Negative moving valgus stress test. Nontender over the lateral epicondylar region Ulnar nerve does not sublux. Negative cubital tunnel Tinel's.      Impression and Recommendations:     This case required medical decision making of moderate complexity.

## 2016-04-02 ENCOUNTER — Encounter (HOSPITAL_COMMUNITY): Payer: Self-pay

## 2016-04-03 ENCOUNTER — Encounter (HOSPITAL_COMMUNITY)
Admission: RE | Admit: 2016-04-03 | Discharge: 2016-04-03 | Disposition: A | Discharge status: Home / Self Care | Payer: Self-pay | Source: Ambulatory Visit | Attending: Cardiology | Admitting: Cardiology

## 2016-04-04 ENCOUNTER — Encounter: Payer: Self-pay | Admitting: Internal Medicine

## 2016-04-04 ENCOUNTER — Encounter (HOSPITAL_COMMUNITY)
Admission: RE | Admit: 2016-04-04 | Discharge: 2016-04-04 | Disposition: A | Discharge status: Home / Self Care | Payer: Self-pay | Source: Ambulatory Visit | Attending: Cardiology | Admitting: Cardiology

## 2016-04-04 ENCOUNTER — Other Ambulatory Visit: Payer: Self-pay

## 2016-04-04 MED ORDER — OMEPRAZOLE 40 MG PO CPDR: 40.0000 mg | DELAYED_RELEASE_CAPSULE | Freq: Every day | ORAL | Status: DC

## 2016-04-05 ENCOUNTER — Other Ambulatory Visit: Payer: Self-pay | Admitting: Internal Medicine

## 2016-04-05 ENCOUNTER — Encounter (HOSPITAL_COMMUNITY)
Admission: RE | Admit: 2016-04-05 | Discharge: 2016-04-05 | Disposition: A | Discharge status: Home / Self Care | Payer: Self-pay | Source: Ambulatory Visit | Attending: Cardiology | Admitting: Cardiology

## 2016-04-08 ENCOUNTER — Encounter (HOSPITAL_COMMUNITY): Payer: Self-pay

## 2016-04-09 ENCOUNTER — Encounter (HOSPITAL_COMMUNITY): Payer: Self-pay

## 2016-04-10 ENCOUNTER — Ambulatory Visit: Payer: Medicare Other | Admitting: Internal Medicine

## 2016-04-11 ENCOUNTER — Encounter (HOSPITAL_COMMUNITY): Payer: Self-pay

## 2016-04-15 ENCOUNTER — Encounter (HOSPITAL_COMMUNITY): Payer: Self-pay

## 2016-04-15 DIAGNOSIS — I2510 Atherosclerotic heart disease of native coronary artery without angina pectoris: Secondary | ICD-10-CM | POA: Insufficient documentation

## 2016-04-15 DIAGNOSIS — I420 Dilated cardiomyopathy: Secondary | ICD-10-CM | POA: Insufficient documentation

## 2016-04-18 ENCOUNTER — Encounter (HOSPITAL_COMMUNITY)
Admission: RE | Admit: 2016-04-18 | Discharge: 2016-04-18 | Disposition: A | Discharge status: Home / Self Care | Payer: Self-pay | Source: Ambulatory Visit | Attending: Cardiology | Admitting: Cardiology

## 2016-04-19 ENCOUNTER — Encounter (HOSPITAL_COMMUNITY)
Admission: RE | Admit: 2016-04-19 | Discharge: 2016-04-19 | Disposition: A | Discharge status: Home / Self Care | Payer: Self-pay | Source: Ambulatory Visit | Attending: Cardiology | Admitting: Cardiology

## 2016-04-22 ENCOUNTER — Ambulatory Visit: Payer: Medicare Other | Admitting: Neurology

## 2016-04-22 ENCOUNTER — Encounter (HOSPITAL_COMMUNITY): Payer: Self-pay

## 2016-04-23 ENCOUNTER — Encounter (HOSPITAL_COMMUNITY): Payer: Self-pay

## 2016-04-24 IMAGING — CT CT HEAD W/O CM
1 series · 15 of 30 positions shown, 19 images · non-contrast
Comparison: 04/01/2014

CLINICAL DATA: Followup tube lead, patient suffered seizure and
cardiac arrest on the way 2 CT department this morning, worrisome
for worsening bleed

EXAM:
CT HEAD WITHOUT CONTRAST
TECHNIQUE: Contiguous axial images were obtained from the base of the skull
through the vertex without intravenous contrast.

[Series 2: head 5.0 h30s · axial · 0.46mm/px · z∈[-115,+35]mm · 15 of 34 slices shown, 19 images]
[im 2/34  brain]
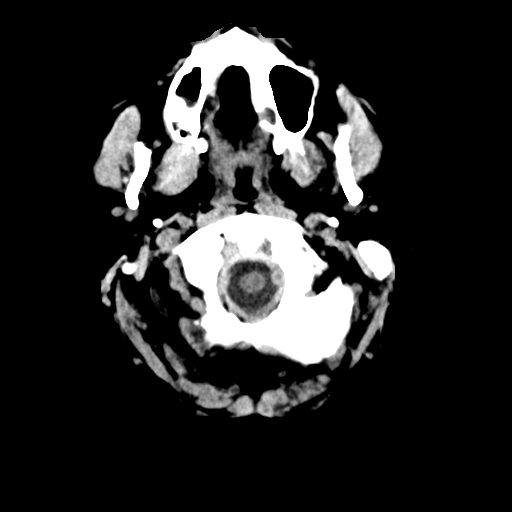
[im 2/34  bone]
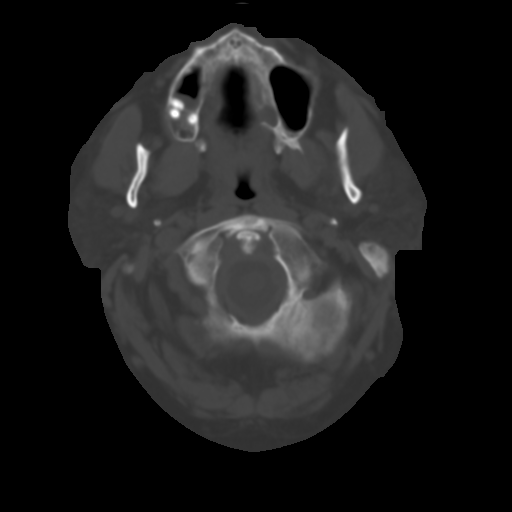
[im 4/34  brain]
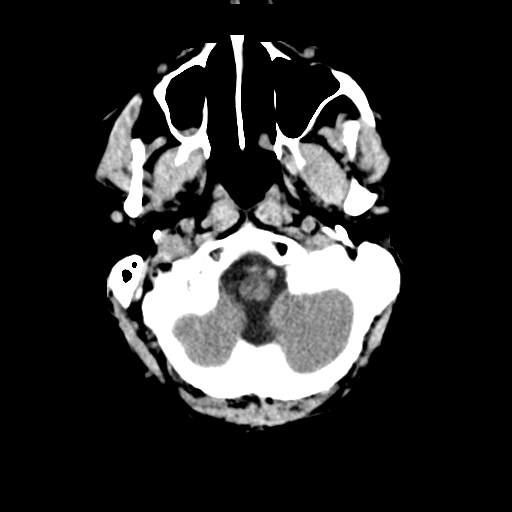
[im 6/34  brain]
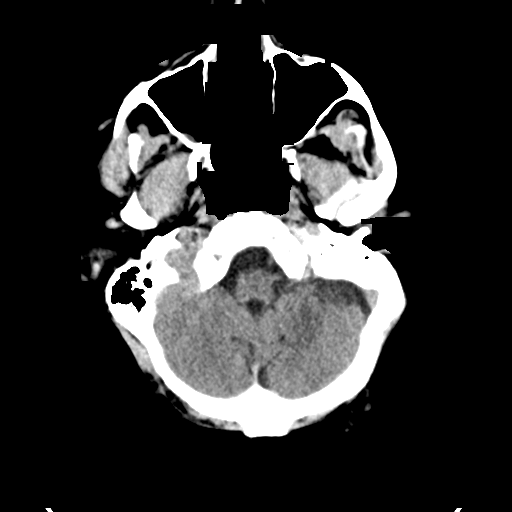
[im 8/34  brain]
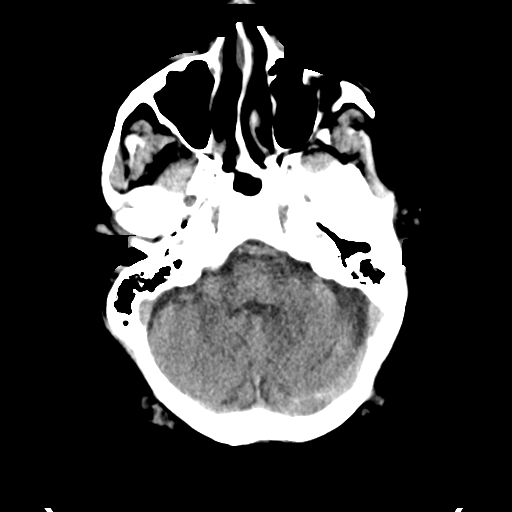
[im 11/34  brain]
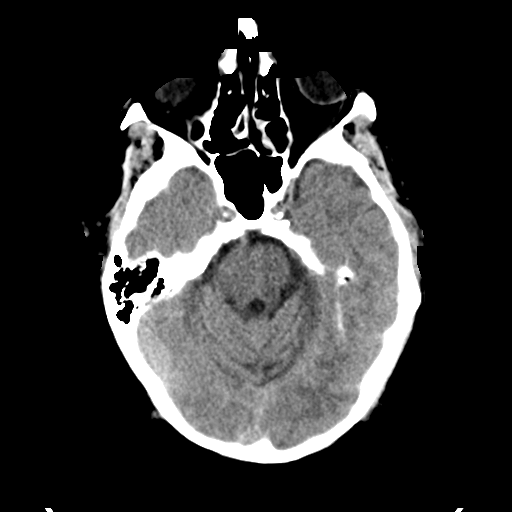
[im 11/34  bone]
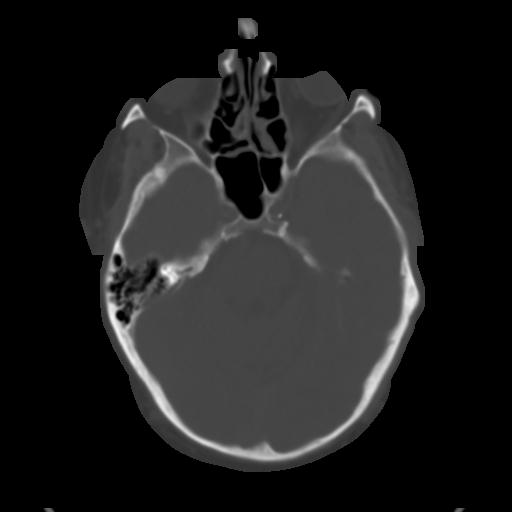
[im 13/34  brain]
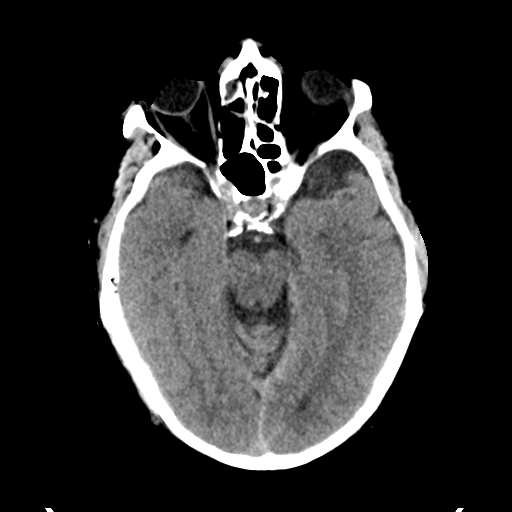
[im 15/34  brain]
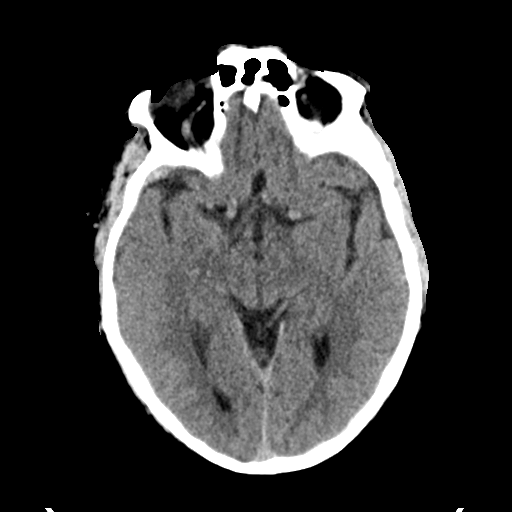
[im 18/34  brain]
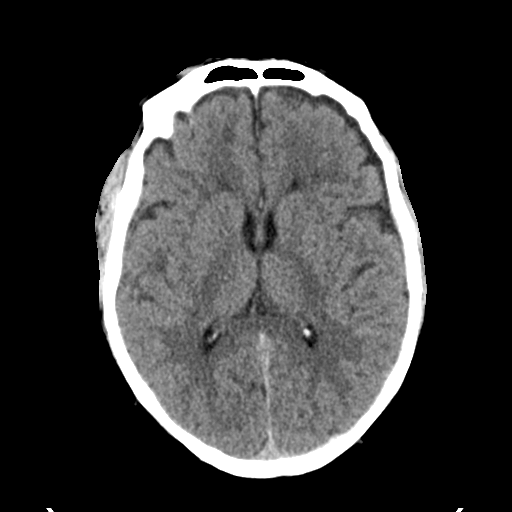
[im 19/34  brain]
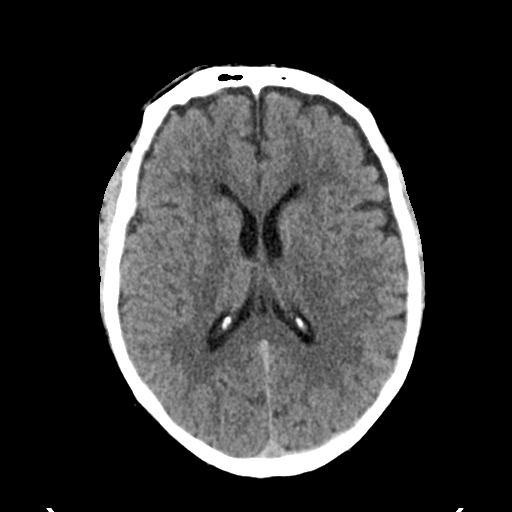
[im 19/34  bone]
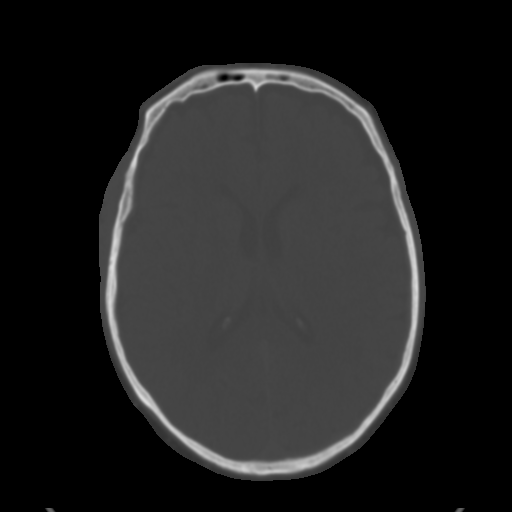
[im 21/34  brain]
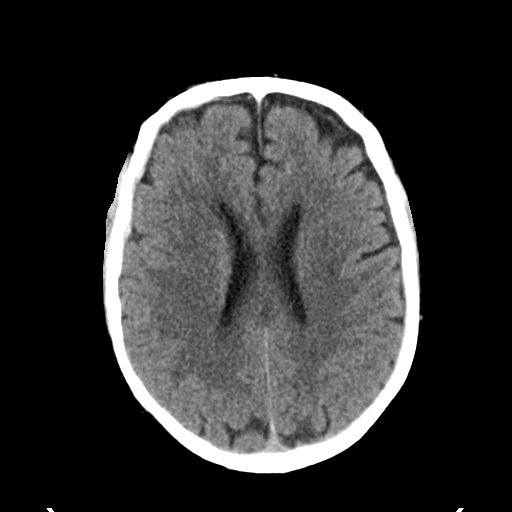
[im 23/34  brain]
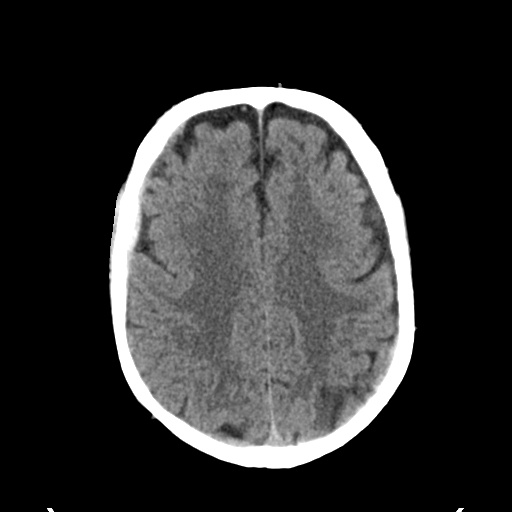
[im 26/34  brain]
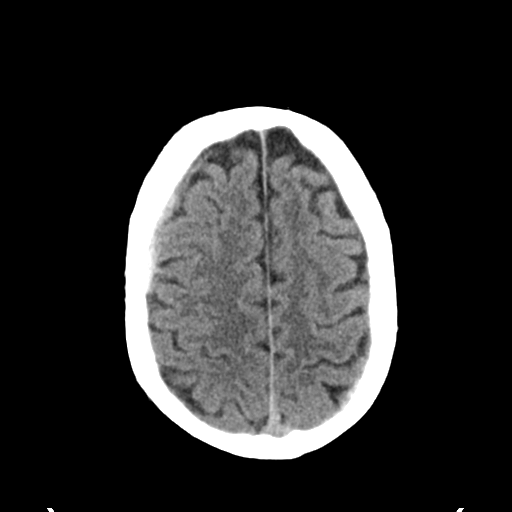
[im 28/34  brain]
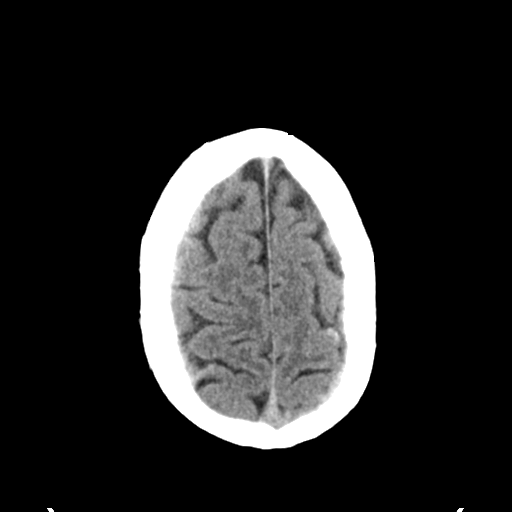
[im 28/34  bone]
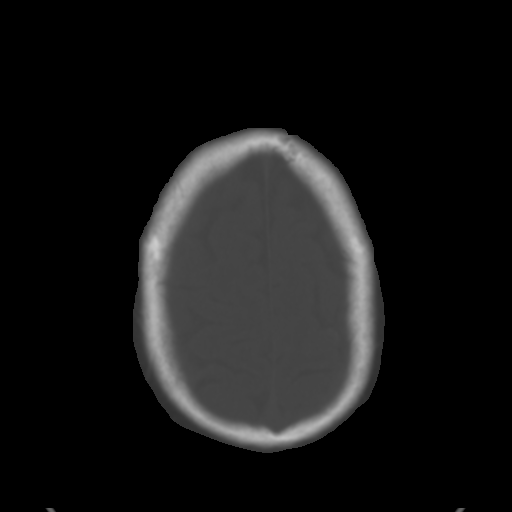
[im 30/34  brain]
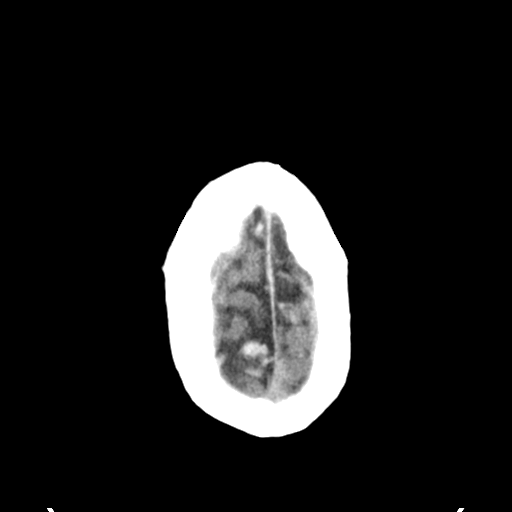
[im 32/34  brain]
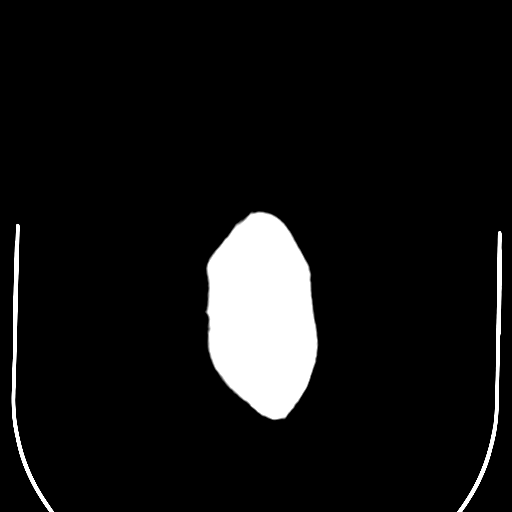

[15 of 30 positions shown; findings below may reference images not displayed]

FINDINGS: Again identified is acute subarachnoid hemorrhage in the left
temporal and occipital lobes. Acute subdural hematoma over the right
frontal and parietal lobes is unchanged at about 6 mm. Posteriorly
at the left vertex there is in unchanged acute subdural hematoma
measuring 6 mm. A few foci of parenchymal contusions seen
bilaterally at the vertex, measuring 13 mm on the right and 7 mm on
the left, unchanged. No evidence of vascular territory infarct.
Stable cortical atrophy. No hydrocephalus. No intraventricular
hemorrhage.
IMPRESSION: Stable intracranial hemorrhage including left-sided temporal and
occipital subarachnoid hemorrhage, bilateral subdural hematomas, and
bilateral parenchymal contusions at the vertex. No significant mass
effect or midline shift. No hydrocephalus.

## 2016-04-25 ENCOUNTER — Encounter (HOSPITAL_COMMUNITY): Payer: Self-pay

## 2016-04-25 IMAGING — CR DG CHEST 1V PORT
1 series · 1 of 1 positions shown · non-contrast
Comparison: 04/02/2014

CLINICAL DATA: Extubation

EXAM:
PORTABLE CHEST - 1 VIEW

[AP]
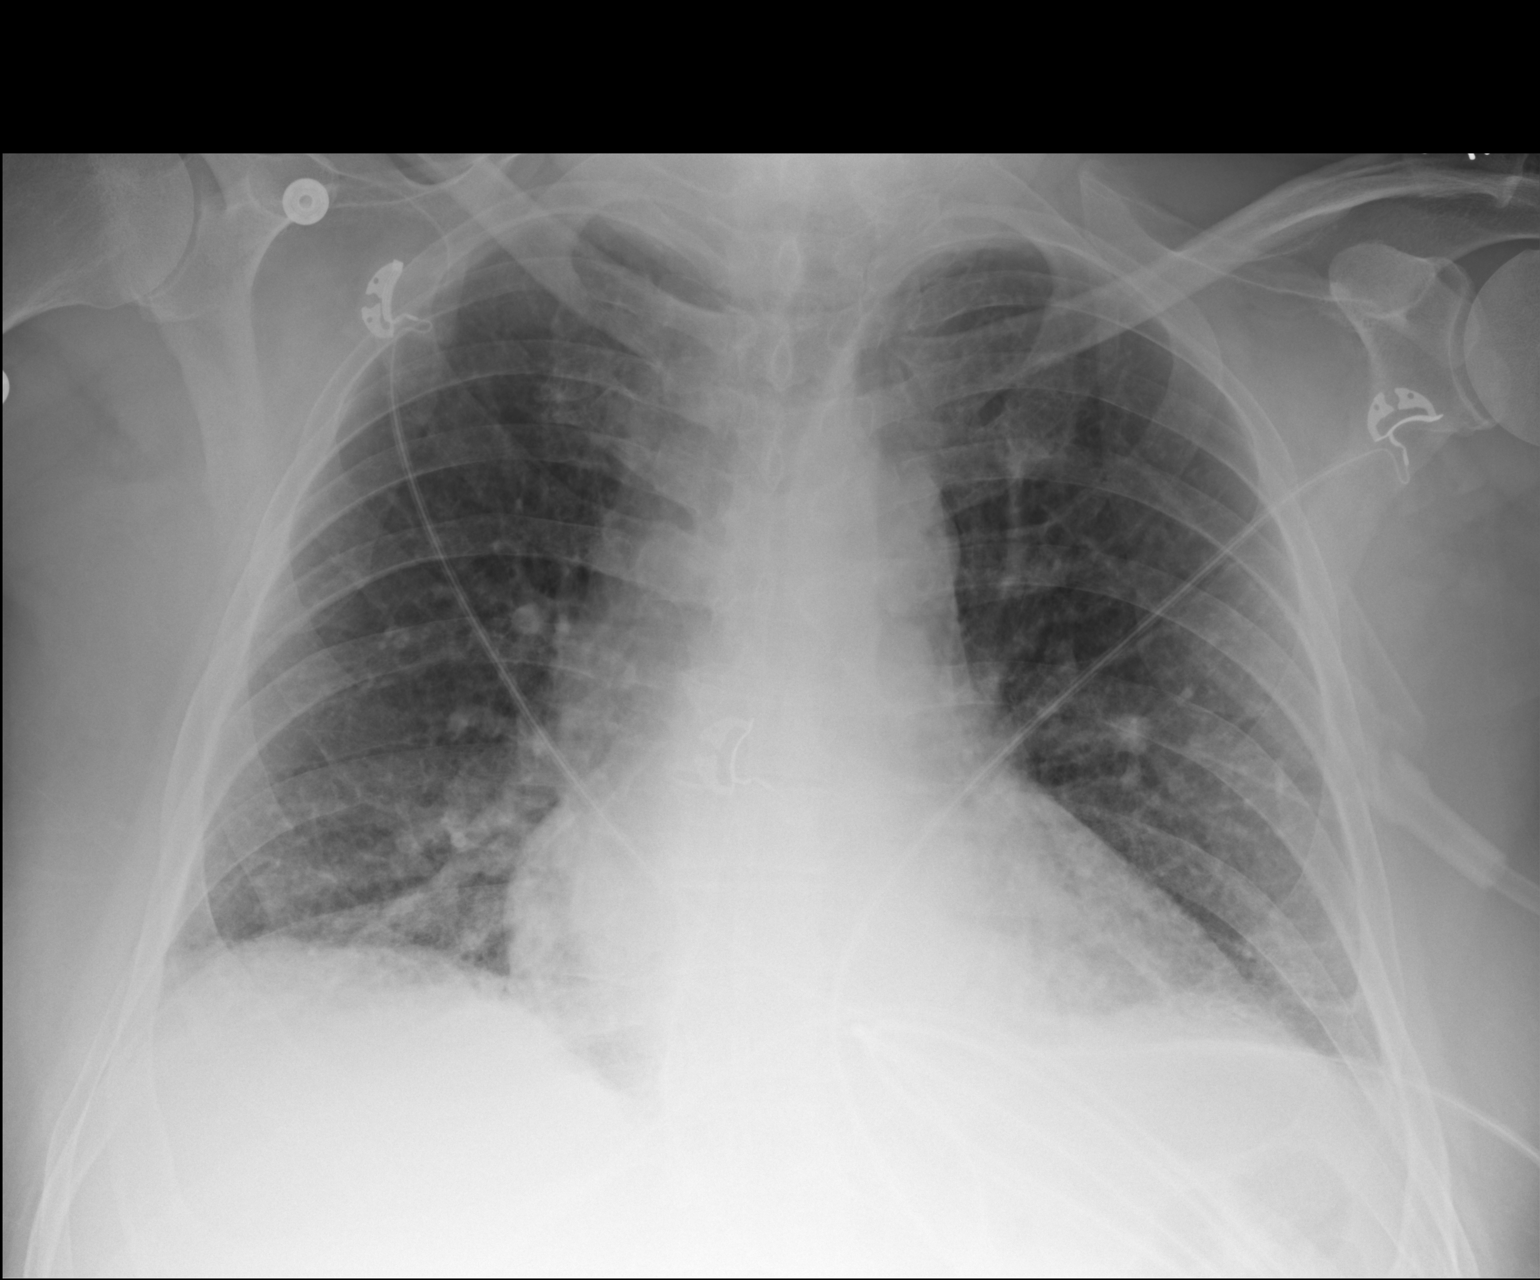

[1 of 1 positions shown; findings below may reference images not displayed]

FINDINGS: Endotracheal tube has been removed. Slightly improved lung volume.
Bibasilar atelectasis/ infiltrate is unchanged. Negative for edema.
IMPRESSION: Bibasilar atelectasis/infiltrate is unchanged. Endotracheal tube
removed.

## 2016-04-29 ENCOUNTER — Encounter (HOSPITAL_COMMUNITY): Payer: Self-pay

## 2016-04-30 ENCOUNTER — Encounter (HOSPITAL_COMMUNITY): Payer: Self-pay
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
## 2016-05-01 ENCOUNTER — Encounter (HOSPITAL_COMMUNITY)
Admission: RE | Admit: 2016-05-01 | Discharge: 2016-05-01 | Disposition: A | Discharge status: Home / Self Care | Payer: Self-pay | Source: Ambulatory Visit | Attending: Cardiology | Admitting: Cardiology

## 2016-05-02 ENCOUNTER — Encounter: Payer: Self-pay | Admitting: Cardiology

## 2016-05-02 ENCOUNTER — Encounter (HOSPITAL_COMMUNITY)
Admission: RE | Admit: 2016-05-02 | Discharge: 2016-05-02 | Disposition: A | Discharge status: Home / Self Care | Payer: Self-pay | Source: Ambulatory Visit | Attending: Cardiology | Admitting: Cardiology

## 2016-05-02 ENCOUNTER — Other Ambulatory Visit: Payer: Self-pay

## 2016-05-02 ENCOUNTER — Ambulatory Visit (INDEPENDENT_AMBULATORY_CARE_PROVIDER_SITE_OTHER): Payer: Medicare Other | Admitting: Cardiology

## 2016-05-02 VITALS — BP 90/60 | HR 78 | Ht 73.0 in | Wt 246.0 lb

## 2016-05-02 DIAGNOSIS — I2510 Atherosclerotic heart disease of native coronary artery without angina pectoris: Secondary | ICD-10-CM | POA: Diagnosis not present

## 2016-05-02 DIAGNOSIS — I10 Essential (primary) hypertension: Secondary | ICD-10-CM | POA: Diagnosis not present

## 2016-05-02 DIAGNOSIS — E785 Hyperlipidemia, unspecified: Secondary | ICD-10-CM

## 2016-05-02 DIAGNOSIS — I714 Abdominal aortic aneurysm, without rupture, unspecified: Secondary | ICD-10-CM

## 2016-05-02 MED ORDER — LEVOTHYROXINE SODIUM 137 MCG PO CAPS: 137.0000 ug | ORAL_CAPSULE | Freq: Every day | ORAL | Status: DC

## 2016-05-02 NOTE — Patient Instructions (Addendum)
Medication Instructions:   Your physician recommends that you continue on your current medications as directed. Please refer to the Current Medication list given to you today.   If you need a refill on your cardiac medications before your next appointment, please call your pharmacy.  Labwork: NONE ORDER TODAY    Testing/Procedures: NONE ORDER TODAY    Follow-Up: Your physician wants you to follow-up in:  IN  6  MONTHS WITH DR CRENSHAW   You will receive a reminder letter in the mail two months in advance. If you don't receive a letter, please call our office to schedule the follow-up appointment.       Any Other Special Instructions Will Be Listed Below (If Applicable).                                                                                                                                                   

## 2016-05-05 ENCOUNTER — Encounter: Payer: Self-pay | Admitting: Internal Medicine

## 2016-05-05 ENCOUNTER — Other Ambulatory Visit: Payer: Self-pay | Admitting: Internal Medicine

## 2016-05-06 ENCOUNTER — Encounter (HOSPITAL_COMMUNITY): Payer: Self-pay

## 2016-05-07 ENCOUNTER — Encounter (HOSPITAL_COMMUNITY): Payer: Self-pay

## 2016-05-09 ENCOUNTER — Encounter (HOSPITAL_COMMUNITY): Payer: Self-pay

## 2016-05-13 ENCOUNTER — Encounter (HOSPITAL_COMMUNITY): Payer: Self-pay

## 2016-05-14 ENCOUNTER — Encounter (HOSPITAL_COMMUNITY)
Admission: RE | Admit: 2016-05-14 | Discharge: 2016-05-14 | Disposition: A | Discharge status: Home / Self Care | Payer: Self-pay | Source: Ambulatory Visit | Attending: Cardiology | Admitting: Cardiology

## 2016-05-14 DIAGNOSIS — I2510 Atherosclerotic heart disease of native coronary artery without angina pectoris: Secondary | ICD-10-CM | POA: Insufficient documentation

## 2016-05-14 DIAGNOSIS — I420 Dilated cardiomyopathy: Secondary | ICD-10-CM | POA: Insufficient documentation

## 2016-05-15 ENCOUNTER — Telehealth: Payer: Self-pay

## 2016-05-15 NOTE — Telephone Encounter (Signed)
[facility] Medical Report completed and placed on MD's desk for signature

## 2016-05-16 ENCOUNTER — Ambulatory Visit (INDEPENDENT_AMBULATORY_CARE_PROVIDER_SITE_OTHER): Payer: Medicare Other | Admitting: Nurse Practitioner

## 2016-05-16 ENCOUNTER — Encounter: Payer: Self-pay | Admitting: Nurse Practitioner

## 2016-05-16 ENCOUNTER — Encounter (HOSPITAL_COMMUNITY)
Admission: RE | Admit: 2016-05-16 | Discharge: 2016-05-16 | Disposition: A | Discharge status: Home / Self Care | Payer: Self-pay | Source: Ambulatory Visit | Attending: Cardiology | Admitting: Cardiology

## 2016-05-16 VITALS — BP 95/62 | HR 72 | Ht 73.0 in | Wt 249.4 lb

## 2016-05-16 DIAGNOSIS — I609 Nontraumatic subarachnoid hemorrhage, unspecified: Secondary | ICD-10-CM | POA: Diagnosis not present

## 2016-05-16 DIAGNOSIS — E785 Hyperlipidemia, unspecified: Secondary | ICD-10-CM

## 2016-05-16 DIAGNOSIS — R569 Unspecified convulsions: Secondary | ICD-10-CM

## 2016-05-16 DIAGNOSIS — I2510 Atherosclerotic heart disease of native coronary artery without angina pectoris: Secondary | ICD-10-CM

## 2016-05-16 DIAGNOSIS — I10 Essential (primary) hypertension: Secondary | ICD-10-CM | POA: Diagnosis not present

## 2016-05-16 NOTE — Telephone Encounter (Signed)
Paperwork signed, copy sent to scan, original placed in cabinet for pt pick up. Pt advised of same via personal VM

## 2016-05-16 NOTE — Patient Instructions (Addendum)
continue ASA 81mg  and lipitor for stroke and cardiac prevention. continue keppra 1000mg  bid. Follow up with your primary care physician for stroke risk factor modification. Recommend maintain blood pressure goal around 130/80,  diabetes with hemoglobin A1c goal below 6.5%  and lipids with LDL cholesterol goal below 70 mg/dL.  check BP and glucose at home Continue cardiac rehabilitation be careful with ambulation to  avoid falls  Follow up in 8 months
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
## 2016-05-20 ENCOUNTER — Encounter (HOSPITAL_COMMUNITY): Payer: Self-pay

## 2016-05-20 IMAGING — CT CT HEAD W/O CM
1 series · 16 of 30 positions shown, 20 images · non-contrast
Comparison: 04/02/2014

CLINICAL DATA: Subdural hematoma, followup

EXAM:
CT HEAD WITHOUT CONTRAST
TECHNIQUE: Contiguous axial images were obtained from the base of the skull
through the vertex without intravenous contrast.

[Series 2: head_seq -c 4.5 h37s st · axial · 0.48mm/px · z∈[-103,+59]mm · 16 of 40 slices shown, 20 images]
[im 2/40  brain]
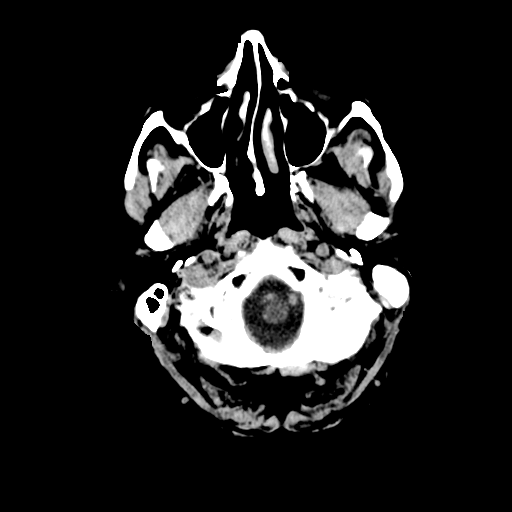
[im 2/40  bone]
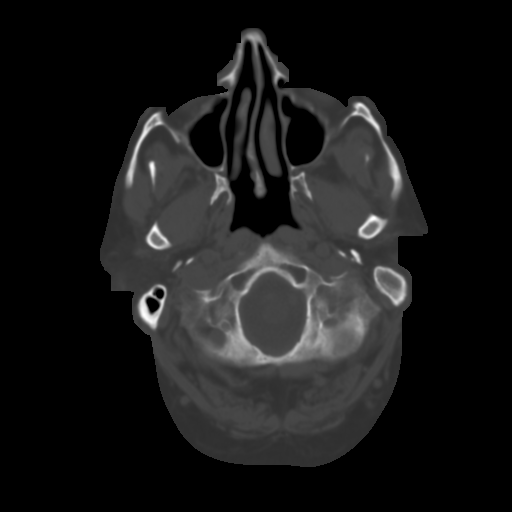
[im 5/40  brain]
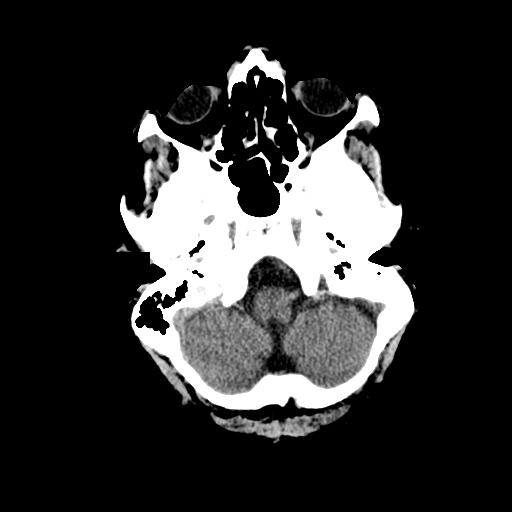
[im 7/40  brain]
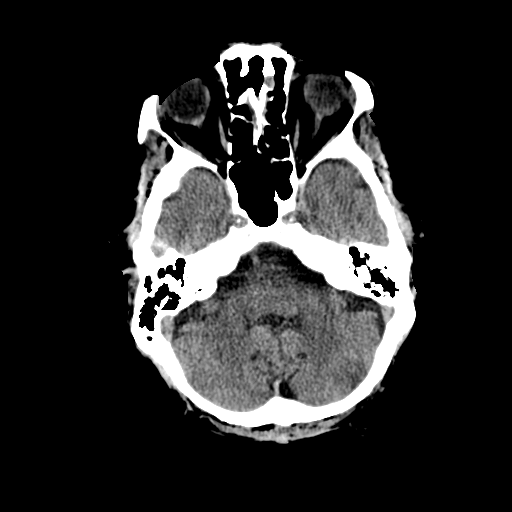
[im 10/40  brain]
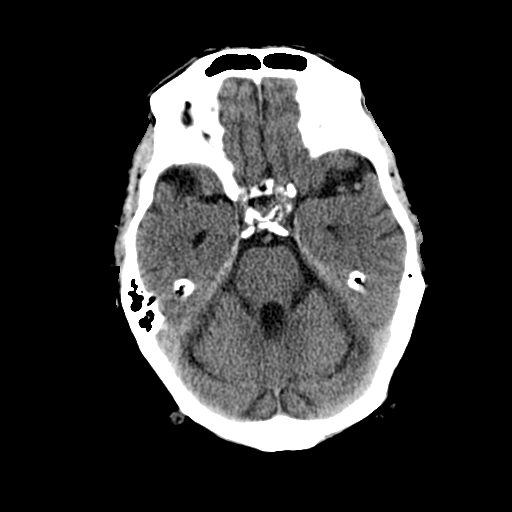
[im 11/40  brain]
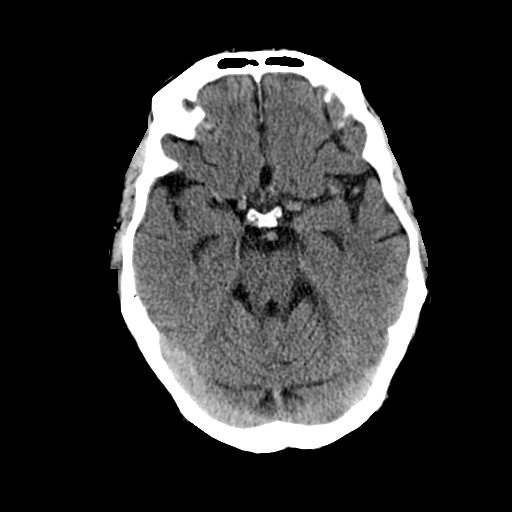
[im 11/40  bone]
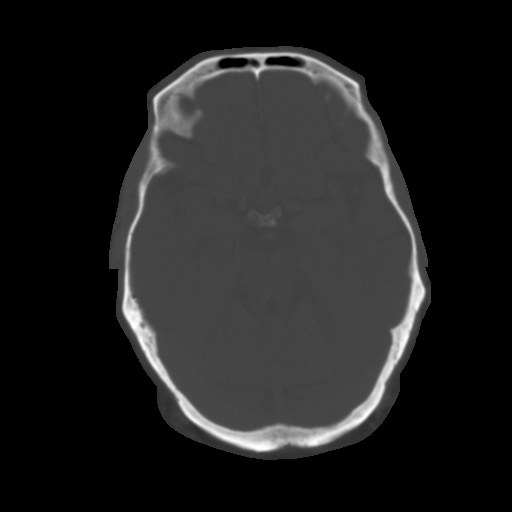
[im 14/40  brain]
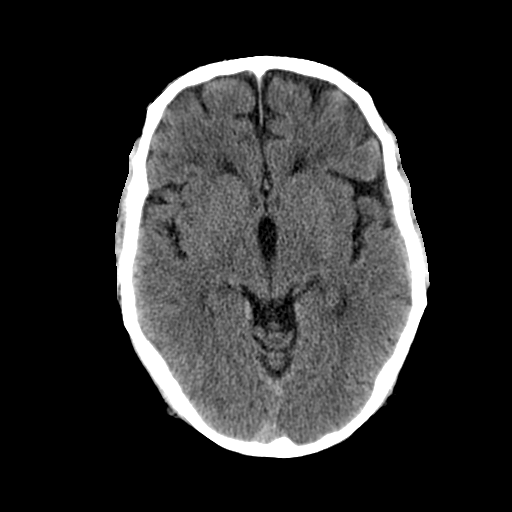
[im 17/40  brain]
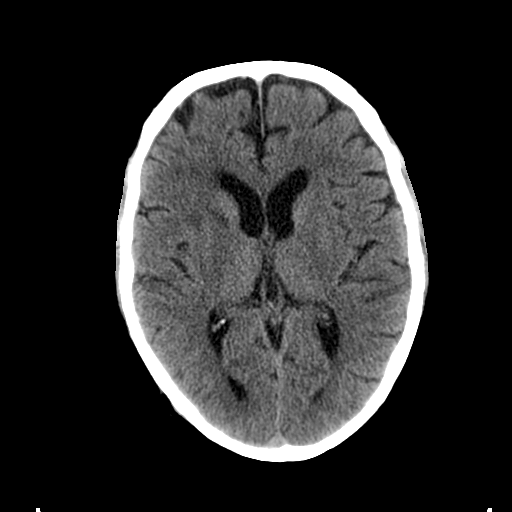
[im 19/40  brain]
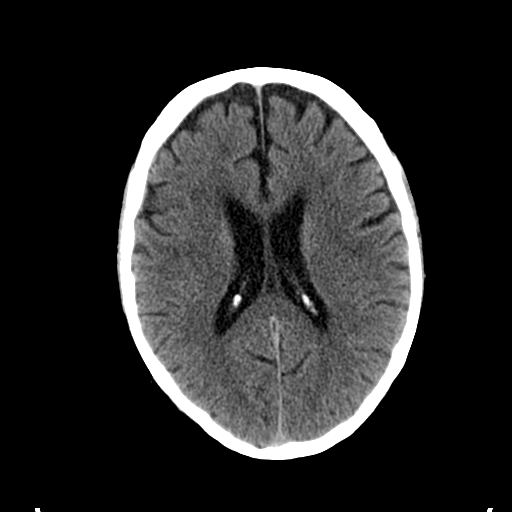
[im 21/40  brain]
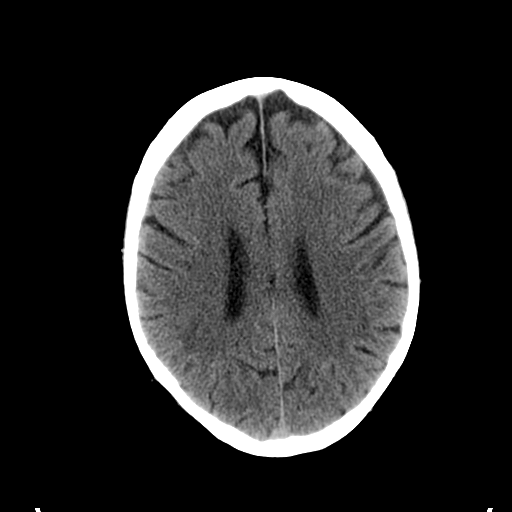
[im 21/40  bone]
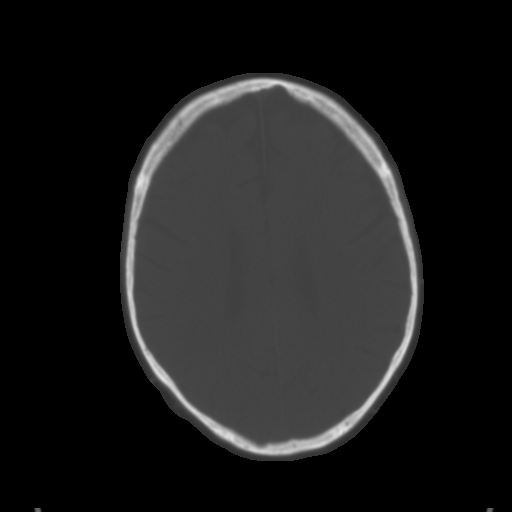
[im 23/40  brain]
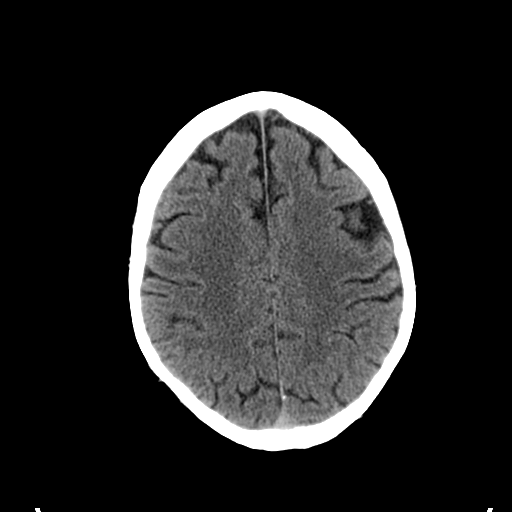
[im 26/40  brain]
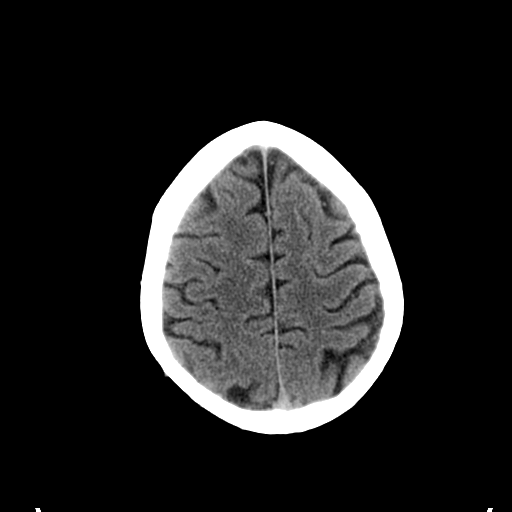
[im 29/40  brain]
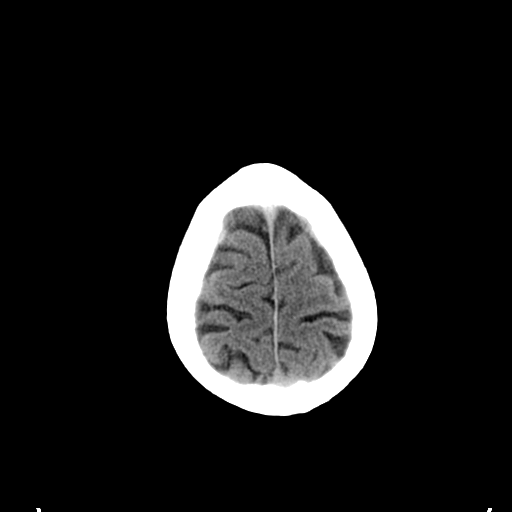
[im 30/40  brain]
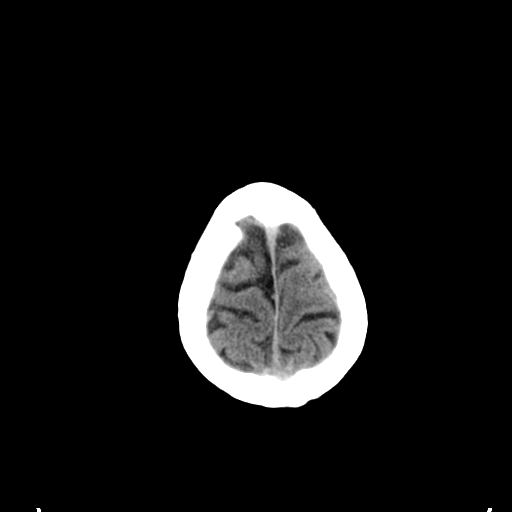
[im 30/40  bone]
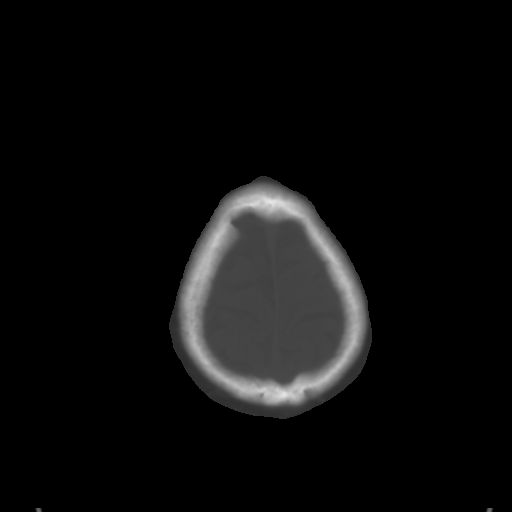
[im 33/40  brain]
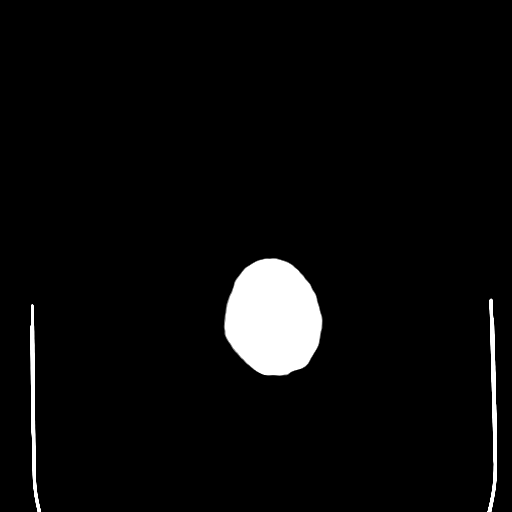
[im 35/40  brain]
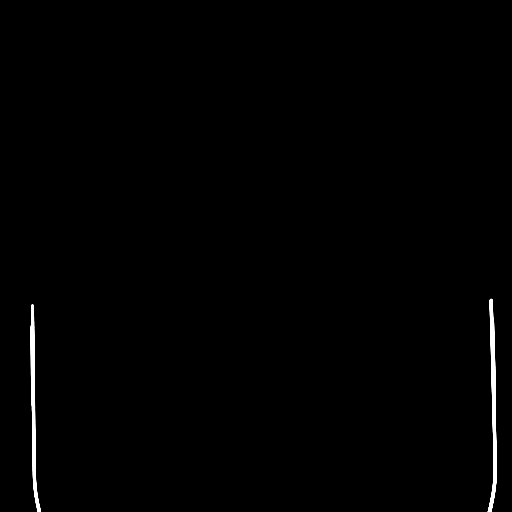
[im 38/40  brain]
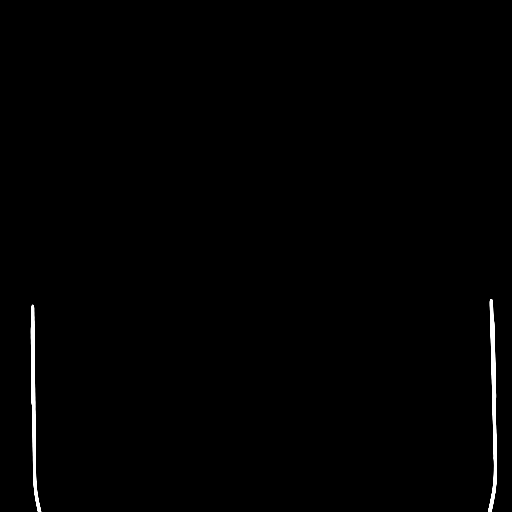

[16 of 30 positions shown; findings below may reference images not displayed]

FINDINGS: Mild generalized atrophy.

Normal ventricular morphology.

No midline shift or mass effect.

Minimal small vessel chronic ischemic changes of deep cerebral white
matter.

Resolution of the RIGHT parietal and posterior LEFT parietal
subdural hematomas identified on the previous study.

Resolution of scattered foci of subarachnoid hemorrhage seen on
previous exam.

No intracranial hemorrhage, mass lesion or evidence acute
infarction.

Few streak artifacts at skull base and posterior fossa.

Atherosclerotic calcifications at skullbase.

No new bone or sinus abnormalities.
IMPRESSION: Resolution of previously identified subdural and subarachnoid
hemorrhages.

Atrophy with small vessel chronic ischemic changes of deep cerebral
white matter.

No new intracranial abnormalities.

## 2016-05-21 ENCOUNTER — Encounter (HOSPITAL_COMMUNITY)
Admission: RE | Admit: 2016-05-21 | Discharge: 2016-05-21 | Disposition: A | Discharge status: Home / Self Care | Payer: Self-pay | Source: Ambulatory Visit | Attending: Cardiology | Admitting: Cardiology

## 2016-05-22 ENCOUNTER — Ambulatory Visit: Payer: Medicare Other | Admitting: Neurology

## 2016-05-23 ENCOUNTER — Encounter (HOSPITAL_COMMUNITY): Payer: Self-pay

## 2016-05-27 ENCOUNTER — Encounter (HOSPITAL_COMMUNITY): Payer: Self-pay

## 2016-05-28 ENCOUNTER — Encounter (HOSPITAL_COMMUNITY): Payer: Self-pay

## 2016-05-30 ENCOUNTER — Encounter (HOSPITAL_COMMUNITY)
Admission: RE | Admit: 2016-05-30 | Discharge: 2016-05-30 | Disposition: A | Discharge status: Home / Self Care | Payer: Self-pay | Source: Ambulatory Visit | Attending: Cardiology | Admitting: Cardiology

## 2016-05-31 ENCOUNTER — Encounter (HOSPITAL_COMMUNITY)
Admission: RE | Admit: 2016-05-31 | Discharge: 2016-05-31 | Disposition: A | Discharge status: Home / Self Care | Payer: Self-pay | Source: Ambulatory Visit | Attending: Cardiology | Admitting: Cardiology

## 2016-06-03 ENCOUNTER — Encounter (HOSPITAL_COMMUNITY)
Admission: RE | Admit: 2016-06-03 | Discharge: 2016-06-03 | Disposition: A | Discharge status: Home / Self Care | Payer: Self-pay | Source: Ambulatory Visit | Attending: Cardiology | Admitting: Cardiology

## 2016-06-04 ENCOUNTER — Encounter (HOSPITAL_COMMUNITY): Payer: Self-pay

## 2016-06-06 ENCOUNTER — Encounter (HOSPITAL_COMMUNITY): Payer: Self-pay

## 2016-06-10 ENCOUNTER — Encounter (HOSPITAL_COMMUNITY)
Admission: RE | Admit: 2016-06-10 | Discharge: 2016-06-10 | Disposition: A | Discharge status: Home / Self Care | Payer: Self-pay | Source: Ambulatory Visit | Attending: Cardiology | Admitting: Cardiology

## 2016-06-11 ENCOUNTER — Encounter: Payer: Self-pay | Admitting: Cardiology

## 2016-06-11 ENCOUNTER — Encounter (HOSPITAL_COMMUNITY)
Admission: RE | Admit: 2016-06-11 | Discharge: 2016-06-11 | Disposition: A | Discharge status: Home / Self Care | Payer: Self-pay | Source: Ambulatory Visit | Attending: Cardiology | Admitting: Cardiology

## 2016-06-13 ENCOUNTER — Encounter (HOSPITAL_COMMUNITY)
Admission: RE | Admit: 2016-06-13 | Discharge: 2016-06-13 | Disposition: A | Discharge status: Home / Self Care | Payer: Self-pay | Source: Ambulatory Visit | Attending: Cardiology | Admitting: Cardiology

## 2016-06-18 ENCOUNTER — Encounter (HOSPITAL_COMMUNITY)
Admission: RE | Admit: 2016-06-18 | Discharge: 2016-06-18 | Disposition: A | Discharge status: Home / Self Care | Payer: Self-pay | Source: Ambulatory Visit | Attending: Cardiology | Admitting: Cardiology

## 2016-06-18 DIAGNOSIS — I420 Dilated cardiomyopathy: Secondary | ICD-10-CM | POA: Insufficient documentation

## 2016-06-18 DIAGNOSIS — I2510 Atherosclerotic heart disease of native coronary artery without angina pectoris: Secondary | ICD-10-CM | POA: Insufficient documentation

## 2016-06-20 ENCOUNTER — Encounter (HOSPITAL_COMMUNITY)
Admission: RE | Admit: 2016-06-20 | Discharge: 2016-06-20 | Disposition: A | Discharge status: Home / Self Care | Payer: Self-pay | Source: Ambulatory Visit | Attending: Cardiology | Admitting: Cardiology

## 2016-06-21 ENCOUNTER — Encounter (HOSPITAL_COMMUNITY)
Admission: RE | Admit: 2016-06-21 | Discharge: 2016-06-21 | Disposition: A | Discharge status: Home / Self Care | Payer: Self-pay | Source: Ambulatory Visit | Attending: Cardiology | Admitting: Cardiology

## 2016-06-24 ENCOUNTER — Encounter (HOSPITAL_COMMUNITY)
Admission: RE | Admit: 2016-06-24 | Discharge: 2016-06-24 | Disposition: A | Discharge status: Home / Self Care | Payer: Self-pay | Source: Ambulatory Visit | Attending: Cardiology | Admitting: Cardiology

## 2016-06-24 ENCOUNTER — Encounter: Payer: Self-pay | Admitting: Cardiology

## 2016-06-25 ENCOUNTER — Encounter (HOSPITAL_COMMUNITY)
Admission: RE | Admit: 2016-06-25 | Discharge: 2016-06-25 | Disposition: A | Discharge status: Home / Self Care | Payer: Self-pay | Source: Ambulatory Visit | Attending: Cardiology | Admitting: Cardiology

## 2016-06-27 ENCOUNTER — Encounter (HOSPITAL_COMMUNITY)
Admission: RE | Admit: 2016-06-27 | Discharge: 2016-06-27 | Disposition: A | Discharge status: Home / Self Care | Payer: Self-pay | Source: Ambulatory Visit | Attending: Cardiology | Admitting: Cardiology

## 2016-06-28 ENCOUNTER — Encounter (HOSPITAL_COMMUNITY)
Admission: RE | Admit: 2016-06-28 | Discharge: 2016-06-28 | Disposition: A | Discharge status: Home / Self Care | Payer: Self-pay | Source: Ambulatory Visit | Attending: Cardiology | Admitting: Cardiology

## 2016-06-30 ENCOUNTER — Other Ambulatory Visit: Payer: Self-pay | Admitting: Internal Medicine

## 2016-07-01 ENCOUNTER — Encounter (HOSPITAL_COMMUNITY): Payer: Self-pay

## 2016-07-02 ENCOUNTER — Encounter (HOSPITAL_COMMUNITY): Payer: Self-pay

## 2016-07-03 ENCOUNTER — Encounter (HOSPITAL_COMMUNITY)
Admission: RE | Admit: 2016-07-03 | Discharge: 2016-07-03 | Disposition: A | Discharge status: Home / Self Care | Payer: Self-pay | Source: Ambulatory Visit | Attending: Cardiology | Admitting: Cardiology

## 2016-07-03 ENCOUNTER — Encounter: Payer: Self-pay | Admitting: *Deleted

## 2016-07-03 ENCOUNTER — Telehealth: Payer: Self-pay | Admitting: *Deleted

## 2016-07-03 NOTE — Telephone Encounter (Signed)
Letter for DOT generated per dr Stanford Breed and placed at the front desk for pt pick up

## 2016-07-04 ENCOUNTER — Encounter (HOSPITAL_COMMUNITY)
Admission: RE | Admit: 2016-07-04 | Discharge: 2016-07-04 | Disposition: A | Discharge status: Home / Self Care | Payer: Self-pay | Source: Ambulatory Visit | Attending: Cardiology | Admitting: Cardiology

## 2016-07-05 ENCOUNTER — Encounter (HOSPITAL_COMMUNITY)
Admission: RE | Admit: 2016-07-05 | Discharge: 2016-07-05 | Disposition: A | Discharge status: Home / Self Care | Payer: Self-pay | Source: Ambulatory Visit | Attending: Cardiology | Admitting: Cardiology

## 2016-07-05 ENCOUNTER — Telehealth: Payer: Self-pay | Admitting: Cardiology

## 2016-07-05 NOTE — Telephone Encounter (Signed)
Spoke with pt, questions regarding last office note answered. He will call if other paperwork is needed

## 2016-07-05 NOTE — Telephone Encounter (Signed)
New message    Pt verbalized that he is retuning call for rn because he needs to go over the paperwork that she gave him

## 2016-07-08 ENCOUNTER — Encounter (HOSPITAL_COMMUNITY)
Admission: RE | Admit: 2016-07-08 | Discharge: 2016-07-08 | Disposition: A | Discharge status: Home / Self Care | Payer: Self-pay | Source: Ambulatory Visit | Attending: Cardiology | Admitting: Cardiology

## 2016-07-09 ENCOUNTER — Encounter (HOSPITAL_COMMUNITY)
Admission: RE | Admit: 2016-07-09 | Discharge: 2016-07-09 | Disposition: A | Discharge status: Home / Self Care | Payer: Self-pay | Source: Ambulatory Visit | Attending: Cardiology | Admitting: Cardiology

## 2016-07-11 ENCOUNTER — Encounter (HOSPITAL_COMMUNITY)
Admission: RE | Admit: 2016-07-11 | Discharge: 2016-07-11 | Disposition: A | Discharge status: Home / Self Care | Payer: Self-pay | Source: Ambulatory Visit | Attending: Cardiology | Admitting: Cardiology

## 2016-07-12 ENCOUNTER — Encounter (HOSPITAL_COMMUNITY)
Admission: RE | Admit: 2016-07-12 | Discharge: 2016-07-12 | Disposition: A | Discharge status: Home / Self Care | Payer: Self-pay | Source: Ambulatory Visit | Attending: Cardiology | Admitting: Cardiology

## 2016-07-15 ENCOUNTER — Encounter (HOSPITAL_COMMUNITY)
Admission: RE | Admit: 2016-07-15 | Discharge: 2016-07-15 | Disposition: A | Discharge status: Home / Self Care | Payer: Self-pay | Source: Ambulatory Visit | Attending: Cardiology | Admitting: Cardiology

## 2016-07-15 DIAGNOSIS — I2510 Atherosclerotic heart disease of native coronary artery without angina pectoris: Secondary | ICD-10-CM | POA: Insufficient documentation

## 2016-07-15 DIAGNOSIS — I420 Dilated cardiomyopathy: Secondary | ICD-10-CM | POA: Insufficient documentation

## 2016-07-16 ENCOUNTER — Encounter (HOSPITAL_COMMUNITY)
Admission: RE | Admit: 2016-07-16 | Discharge: 2016-07-16 | Disposition: A | Discharge status: Home / Self Care | Payer: Self-pay | Source: Ambulatory Visit | Attending: Cardiology | Admitting: Cardiology

## 2016-07-16 DIAGNOSIS — L570 Actinic keratosis: Secondary | ICD-10-CM | POA: Diagnosis not present

## 2016-07-18 ENCOUNTER — Encounter (HOSPITAL_COMMUNITY)
Admission: RE | Admit: 2016-07-18 | Discharge: 2016-07-18 | Disposition: A | Discharge status: Home / Self Care | Payer: Self-pay | Source: Ambulatory Visit | Attending: Cardiology | Admitting: Cardiology

## 2016-07-19 ENCOUNTER — Other Ambulatory Visit: Payer: Self-pay | Admitting: Internal Medicine

## 2016-07-22 ENCOUNTER — Encounter (HOSPITAL_COMMUNITY)
Admission: RE | Admit: 2016-07-22 | Discharge: 2016-07-22 | Disposition: A | Discharge status: Home / Self Care | Payer: Self-pay | Source: Ambulatory Visit | Attending: Cardiology | Admitting: Cardiology

## 2016-07-23 ENCOUNTER — Encounter (HOSPITAL_COMMUNITY)
Admission: RE | Admit: 2016-07-23 | Discharge: 2016-07-23 | Disposition: A | Discharge status: Home / Self Care | Payer: Self-pay | Source: Ambulatory Visit | Attending: Cardiology | Admitting: Cardiology

## 2016-07-24 ENCOUNTER — Ambulatory Visit (INDEPENDENT_AMBULATORY_CARE_PROVIDER_SITE_OTHER): Payer: Medicare Other

## 2016-07-24 DIAGNOSIS — Z23 Encounter for immunization: Secondary | ICD-10-CM | POA: Diagnosis not present

## 2016-07-25 ENCOUNTER — Encounter (HOSPITAL_COMMUNITY)
Admission: RE | Admit: 2016-07-25 | Discharge: 2016-07-25 | Disposition: A | Discharge status: Home / Self Care | Payer: Self-pay | Source: Ambulatory Visit | Attending: Cardiology | Admitting: Cardiology

## 2016-07-29 ENCOUNTER — Encounter (HOSPITAL_COMMUNITY): Payer: Self-pay

## 2016-07-30 ENCOUNTER — Encounter (HOSPITAL_COMMUNITY)
Admission: RE | Admit: 2016-07-30 | Discharge: 2016-07-30 | Disposition: A | Discharge status: Home / Self Care | Payer: Self-pay | Source: Ambulatory Visit | Attending: Cardiology | Admitting: Cardiology

## 2016-07-31 ENCOUNTER — Other Ambulatory Visit: Payer: Self-pay | Admitting: Internal Medicine

## 2016-08-01 ENCOUNTER — Encounter (HOSPITAL_COMMUNITY)
Admission: RE | Admit: 2016-08-01 | Discharge: 2016-08-01 | Disposition: A | Discharge status: Home / Self Care | Payer: Self-pay | Source: Ambulatory Visit | Attending: Cardiology | Admitting: Cardiology

## 2016-08-02 ENCOUNTER — Encounter (HOSPITAL_COMMUNITY)
Admission: RE | Admit: 2016-08-02 | Discharge: 2016-08-02 | Disposition: A | Discharge status: Home / Self Care | Payer: Self-pay | Source: Ambulatory Visit | Attending: Cardiology | Admitting: Cardiology

## 2016-08-05 ENCOUNTER — Encounter (HOSPITAL_COMMUNITY): Payer: Self-pay

## 2016-08-06 ENCOUNTER — Encounter (HOSPITAL_COMMUNITY)
Admission: RE | Admit: 2016-08-06 | Discharge: 2016-08-06 | Disposition: A | Discharge status: Home / Self Care | Payer: Self-pay | Source: Ambulatory Visit | Attending: Cardiology | Admitting: Cardiology

## 2016-08-07 ENCOUNTER — Encounter (HOSPITAL_COMMUNITY)
Admission: RE | Admit: 2016-08-07 | Discharge: 2016-08-07 | Disposition: A | Discharge status: Home / Self Care | Payer: Self-pay | Source: Ambulatory Visit | Attending: Cardiology | Admitting: Cardiology

## 2016-08-08 ENCOUNTER — Encounter (HOSPITAL_COMMUNITY): Payer: Self-pay

## 2016-08-08 ENCOUNTER — Other Ambulatory Visit: Payer: Self-pay | Admitting: Internal Medicine

## 2016-08-12 ENCOUNTER — Encounter (HOSPITAL_COMMUNITY): Payer: Self-pay

## 2016-08-13 ENCOUNTER — Encounter (HOSPITAL_COMMUNITY)
Admission: RE | Admit: 2016-08-13 | Discharge: 2016-08-13 | Disposition: A | Discharge status: Home / Self Care | Payer: Self-pay | Source: Ambulatory Visit | Attending: Cardiology | Admitting: Cardiology

## 2016-08-15 ENCOUNTER — Encounter (HOSPITAL_COMMUNITY): Payer: Self-pay

## 2016-08-15 DIAGNOSIS — I420 Dilated cardiomyopathy: Secondary | ICD-10-CM | POA: Insufficient documentation

## 2016-08-15 DIAGNOSIS — I2510 Atherosclerotic heart disease of native coronary artery without angina pectoris: Secondary | ICD-10-CM | POA: Insufficient documentation

## 2016-08-16 ENCOUNTER — Encounter (HOSPITAL_COMMUNITY)
Admission: RE | Admit: 2016-08-16 | Discharge: 2016-08-16 | Disposition: A | Discharge status: Home / Self Care | Payer: Self-pay | Source: Ambulatory Visit | Attending: Cardiology | Admitting: Cardiology

## 2016-08-19 ENCOUNTER — Encounter (HOSPITAL_COMMUNITY): Payer: Self-pay

## 2016-08-20 ENCOUNTER — Encounter (HOSPITAL_COMMUNITY)
Admission: RE | Admit: 2016-08-20 | Discharge: 2016-08-20 | Disposition: A | Discharge status: Home / Self Care | Payer: Self-pay | Source: Ambulatory Visit | Attending: Cardiology | Admitting: Cardiology

## 2016-08-22 ENCOUNTER — Encounter (HOSPITAL_COMMUNITY): Payer: Self-pay

## 2016-08-23 ENCOUNTER — Encounter (HOSPITAL_COMMUNITY)
Admission: RE | Admit: 2016-08-23 | Discharge: 2016-08-23 | Disposition: A | Discharge status: Home / Self Care | Payer: Self-pay | Source: Ambulatory Visit | Attending: Cardiology | Admitting: Cardiology

## 2016-08-26 ENCOUNTER — Encounter (HOSPITAL_COMMUNITY): Payer: Self-pay

## 2016-08-27 ENCOUNTER — Encounter (HOSPITAL_COMMUNITY)
Admission: RE | Admit: 2016-08-27 | Discharge: 2016-08-27 | Disposition: A | Discharge status: Home / Self Care | Payer: Self-pay | Source: Ambulatory Visit | Attending: Cardiology | Admitting: Cardiology

## 2016-08-29 ENCOUNTER — Encounter (HOSPITAL_COMMUNITY)
Admission: RE | Admit: 2016-08-29 | Discharge: 2016-08-29 | Disposition: A | Discharge status: Home / Self Care | Payer: Self-pay | Source: Ambulatory Visit | Attending: Cardiology | Admitting: Cardiology

## 2016-09-02 ENCOUNTER — Encounter (HOSPITAL_COMMUNITY): Payer: Self-pay

## 2016-09-03 ENCOUNTER — Encounter (HOSPITAL_COMMUNITY)
Admission: RE | Admit: 2016-09-03 | Discharge: 2016-09-03 | Disposition: A | Discharge status: Home / Self Care | Payer: Self-pay | Source: Ambulatory Visit | Attending: Cardiology | Admitting: Cardiology

## 2016-09-09 ENCOUNTER — Encounter (HOSPITAL_COMMUNITY)
Admission: RE | Admit: 2016-09-09 | Discharge: 2016-09-09 | Disposition: A | Discharge status: Home / Self Care | Payer: Self-pay | Source: Ambulatory Visit | Attending: Cardiology | Admitting: Cardiology

## 2016-09-10 ENCOUNTER — Encounter (HOSPITAL_COMMUNITY)
Admission: RE | Admit: 2016-09-10 | Discharge: 2016-09-10 | Disposition: A | Discharge status: Home / Self Care | Payer: Self-pay | Source: Ambulatory Visit | Attending: Cardiology | Admitting: Cardiology

## 2016-09-12 ENCOUNTER — Encounter (HOSPITAL_COMMUNITY)
Admission: RE | Admit: 2016-09-12 | Discharge: 2016-09-12 | Disposition: A | Discharge status: Home / Self Care | Payer: Self-pay | Source: Ambulatory Visit | Attending: Cardiology | Admitting: Cardiology

## 2016-09-16 ENCOUNTER — Encounter (HOSPITAL_COMMUNITY): Payer: Self-pay

## 2016-09-16 DIAGNOSIS — I420 Dilated cardiomyopathy: Secondary | ICD-10-CM | POA: Insufficient documentation

## 2016-09-16 DIAGNOSIS — I2510 Atherosclerotic heart disease of native coronary artery without angina pectoris: Secondary | ICD-10-CM | POA: Insufficient documentation

## 2016-09-17 ENCOUNTER — Encounter: Payer: Self-pay | Admitting: Internal Medicine

## 2016-09-17 ENCOUNTER — Encounter (HOSPITAL_COMMUNITY)
Admission: RE | Admit: 2016-09-17 | Discharge: 2016-09-17 | Disposition: A | Discharge status: Home / Self Care | Payer: Self-pay | Source: Ambulatory Visit | Attending: Cardiology | Admitting: Cardiology

## 2016-09-17 NOTE — Telephone Encounter (Signed)
Tammy, can we contact pt to see if we can help make appt with Dr Tamala Julian for bilat feet pain

## 2016-09-18 ENCOUNTER — Telehealth: Payer: Self-pay | Admitting: Internal Medicine

## 2016-09-18 MED ORDER — GABAPENTIN 100 MG PO CAPS: 100.0000 mg | ORAL_CAPSULE | Freq: Three times a day (TID) | ORAL | 5 refills | Status: DC

## 2016-09-18 NOTE — Telephone Encounter (Signed)
OK to try the gabapentin 100 mg three times per day - this has been sent to your pharmacy, thanks

## 2016-09-18 NOTE — Telephone Encounter (Signed)
Patient states he does not have plantar fasciitis.  States has seen Dr. Tamala Julian before and does not want to see Dr. Tamala Julian.  States he wants to see a specialist in diabetes but does not want to wait three weeks to get in with a specialist.  Also, states he wants Dr. Jenny Reichmann to start treatment for his foot.  Please follow up in regard.

## 2016-09-19 ENCOUNTER — Encounter (HOSPITAL_COMMUNITY)
Admission: RE | Admit: 2016-09-19 | Discharge: 2016-09-19 | Disposition: A | Discharge status: Home / Self Care | Payer: Self-pay | Source: Ambulatory Visit | Attending: Cardiology | Admitting: Cardiology

## 2016-09-20 ENCOUNTER — Encounter (HOSPITAL_COMMUNITY)
Admission: RE | Admit: 2016-09-20 | Discharge: 2016-09-20 | Disposition: A | Discharge status: Home / Self Care | Payer: Self-pay | Source: Ambulatory Visit | Attending: Cardiology | Admitting: Cardiology

## 2016-09-23 ENCOUNTER — Encounter (HOSPITAL_COMMUNITY): Payer: Self-pay

## 2016-09-24 ENCOUNTER — Encounter (HOSPITAL_COMMUNITY): Payer: Self-pay

## 2016-09-26 ENCOUNTER — Encounter (HOSPITAL_COMMUNITY)
Admission: RE | Admit: 2016-09-26 | Discharge: 2016-09-26 | Disposition: A | Discharge status: Home / Self Care | Payer: Self-pay | Source: Ambulatory Visit | Attending: Cardiology | Admitting: Cardiology

## 2016-09-27 ENCOUNTER — Encounter (HOSPITAL_COMMUNITY)
Admission: RE | Admit: 2016-09-27 | Discharge: 2016-09-27 | Disposition: A | Discharge status: Home / Self Care | Payer: Self-pay | Source: Ambulatory Visit | Attending: Cardiology | Admitting: Cardiology

## 2016-09-30 ENCOUNTER — Encounter (HOSPITAL_COMMUNITY)
Admission: RE | Admit: 2016-09-30 | Discharge: 2016-09-30 | Disposition: A | Discharge status: Home / Self Care | Payer: Self-pay | Source: Ambulatory Visit | Attending: Cardiology | Admitting: Cardiology

## 2016-10-01 ENCOUNTER — Encounter (HOSPITAL_COMMUNITY): Payer: Self-pay

## 2016-10-03 ENCOUNTER — Encounter (HOSPITAL_COMMUNITY): Payer: Self-pay

## 2016-10-08 ENCOUNTER — Encounter (HOSPITAL_COMMUNITY): Payer: Self-pay

## 2016-10-10 ENCOUNTER — Encounter (HOSPITAL_COMMUNITY): Payer: Self-pay

## 2016-10-15 ENCOUNTER — Encounter (HOSPITAL_COMMUNITY): Payer: Self-pay

## 2016-10-15 DIAGNOSIS — I2510 Atherosclerotic heart disease of native coronary artery without angina pectoris: Secondary | ICD-10-CM | POA: Insufficient documentation

## 2016-10-15 DIAGNOSIS — I420 Dilated cardiomyopathy: Secondary | ICD-10-CM | POA: Insufficient documentation

## 2016-10-16 ENCOUNTER — Other Ambulatory Visit: Payer: Self-pay | Admitting: Internal Medicine

## 2016-10-17 ENCOUNTER — Encounter (HOSPITAL_COMMUNITY): Payer: Self-pay

## 2016-10-21 ENCOUNTER — Encounter (HOSPITAL_COMMUNITY)
Admission: RE | Admit: 2016-10-21 | Discharge: 2016-10-21 | Disposition: A | Discharge status: Home / Self Care | Payer: Self-pay | Source: Ambulatory Visit | Attending: Cardiology | Admitting: Cardiology

## 2016-10-22 ENCOUNTER — Encounter (HOSPITAL_COMMUNITY)
Admission: RE | Admit: 2016-10-22 | Discharge: 2016-10-22 | Disposition: A | Discharge status: Home / Self Care | Payer: Self-pay | Source: Ambulatory Visit | Attending: Cardiology | Admitting: Cardiology

## 2016-10-23 ENCOUNTER — Other Ambulatory Visit: Payer: Self-pay | Admitting: Internal Medicine

## 2016-10-23 ENCOUNTER — Other Ambulatory Visit: Payer: Self-pay | Admitting: *Deleted

## 2016-10-24 ENCOUNTER — Encounter (HOSPITAL_COMMUNITY)
Admission: RE | Admit: 2016-10-24 | Discharge: 2016-10-24 | Disposition: A | Discharge status: Home / Self Care | Payer: Self-pay | Source: Ambulatory Visit | Attending: Cardiology | Admitting: Cardiology
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
## 2016-10-28 ENCOUNTER — Encounter (HOSPITAL_COMMUNITY)
Admission: RE | Admit: 2016-10-28 | Discharge: 2016-10-28 | Disposition: A | Discharge status: Home / Self Care | Payer: Self-pay | Source: Ambulatory Visit | Attending: Cardiology | Admitting: Cardiology

## 2016-10-29 ENCOUNTER — Encounter (HOSPITAL_COMMUNITY)
Admission: RE | Admit: 2016-10-29 | Discharge: 2016-10-29 | Disposition: A | Discharge status: Home / Self Care | Payer: Self-pay | Source: Ambulatory Visit | Attending: Cardiology | Admitting: Cardiology

## 2016-10-31 ENCOUNTER — Encounter (HOSPITAL_COMMUNITY): Payer: Self-pay

## 2016-11-01 ENCOUNTER — Other Ambulatory Visit: Payer: Self-pay | Admitting: Internal Medicine

## 2016-11-04 ENCOUNTER — Encounter: Payer: Self-pay | Admitting: Cardiology

## 2016-11-04 ENCOUNTER — Encounter (HOSPITAL_COMMUNITY)
Admission: RE | Admit: 2016-11-04 | Discharge: 2016-11-04 | Disposition: A | Discharge status: Home / Self Care | Payer: Self-pay | Source: Ambulatory Visit | Attending: Cardiology | Admitting: Cardiology

## 2016-11-04 ENCOUNTER — Ambulatory Visit (INDEPENDENT_AMBULATORY_CARE_PROVIDER_SITE_OTHER): Payer: Medicare Other | Admitting: Cardiology

## 2016-11-04 VITALS — BP 100/58 | HR 69 | Ht 73.0 in

## 2016-11-04 DIAGNOSIS — E7800 Pure hypercholesterolemia, unspecified: Secondary | ICD-10-CM

## 2016-11-04 DIAGNOSIS — I714 Abdominal aortic aneurysm, without rupture, unspecified: Secondary | ICD-10-CM

## 2016-11-04 DIAGNOSIS — I10 Essential (primary) hypertension: Secondary | ICD-10-CM | POA: Diagnosis not present

## 2016-11-04 DIAGNOSIS — I2510 Atherosclerotic heart disease of native coronary artery without angina pectoris: Secondary | ICD-10-CM

## 2016-11-04 NOTE — Patient Instructions (Signed)
Medication Instructions:   NO CHANGE  Labwork:  Your physician recommends that you return for lab work WHEN FASTING  Follow-Up:  Your physician wants you to follow-up in: ONE YEAR WITH DR CRENSHAW You will receive a reminder letter in the mail two months in advance. If you don't receive a letter, please call our office to schedule the follow-up appointment.   If you need a refill on your cardiac medications before your next appointment, please call your pharmacy.    

## 2016-11-05 ENCOUNTER — Encounter (HOSPITAL_COMMUNITY)
Admission: RE | Admit: 2016-11-05 | Discharge: 2016-11-05 | Disposition: A | Discharge status: Home / Self Care | Payer: Self-pay | Source: Ambulatory Visit | Attending: Cardiology | Admitting: Cardiology

## 2016-11-05 ENCOUNTER — Telehealth: Payer: Self-pay | Admitting: Internal Medicine

## 2016-11-05 MED ORDER — LINAGLIPTIN 5 MG PO TABS: 5.0000 mg | ORAL_TABLET | Freq: Every day | ORAL | 3 refills | Status: DC

## 2016-11-05 NOTE — Telephone Encounter (Signed)
Medication sent to pharmacy  

## 2016-11-05 NOTE — Addendum Note (Signed)
Addended by: Della Goo C on: 11/05/2016 04:56 PM   Modules accepted: Orders

## 2016-11-05 NOTE — Telephone Encounter (Signed)
Patient would like his new pharmacy to be updated as [facility].  Patient is requesting all meds to be sent over.   Patient really needs tradjenta.  But wants all scripts to have pharmacy keep on file.

## 2016-11-07 ENCOUNTER — Encounter (HOSPITAL_COMMUNITY)
Admission: RE | Admit: 2016-11-07 | Discharge: 2016-11-07 | Disposition: A | Discharge status: Home / Self Care | Payer: Self-pay | Source: Ambulatory Visit | Attending: Cardiology | Admitting: Cardiology

## 2016-11-11 ENCOUNTER — Encounter (HOSPITAL_COMMUNITY)
Admission: RE | Admit: 2016-11-11 | Discharge: 2016-11-11 | Disposition: A | Discharge status: Home / Self Care | Payer: Self-pay | Source: Ambulatory Visit | Attending: Cardiology | Admitting: Cardiology

## 2016-11-11 ENCOUNTER — Encounter: Payer: Self-pay | Admitting: Internal Medicine

## 2016-11-12 ENCOUNTER — Encounter (HOSPITAL_COMMUNITY): Payer: Self-pay

## 2016-11-12 MED ORDER — CARVEDILOL 3.125 MG PO TABS: ORAL_TABLET | ORAL | 1 refills | Status: DC

## 2016-11-12 MED ORDER — ATORVASTATIN CALCIUM 80 MG PO TABS
80.0000 mg | ORAL_TABLET | Freq: Every day | ORAL | 1 refills | Status: DC
Start: 2016-11-12 — End: 2017-02-04

## 2016-11-12 MED ORDER — LEVOTHYROXINE SODIUM 137 MCG PO TABS: 137.0000 ug | ORAL_TABLET | Freq: Every day | ORAL | 1 refills | Status: DC

## 2016-11-12 MED ORDER — PIOGLITAZONE HCL 30 MG PO TABS
30.0000 mg | ORAL_TABLET | Freq: Every day | ORAL | 1 refills | Status: DC
Start: 2016-11-12 — End: 2017-02-04

## 2016-11-12 MED ORDER — LISINOPRIL 2.5 MG PO TABS: 2.5000 mg | ORAL_TABLET | Freq: Every day | ORAL | 1 refills | Status: DC

## 2016-11-13 ENCOUNTER — Encounter (HOSPITAL_COMMUNITY)
Admission: RE | Admit: 2016-11-13 | Discharge: 2016-11-13 | Disposition: A | Discharge status: Home / Self Care | Payer: Self-pay | Source: Ambulatory Visit | Attending: Cardiology | Admitting: Cardiology

## 2016-11-14 ENCOUNTER — Encounter (HOSPITAL_COMMUNITY): Payer: Self-pay

## 2016-11-14 DIAGNOSIS — I2510 Atherosclerotic heart disease of native coronary artery without angina pectoris: Secondary | ICD-10-CM | POA: Diagnosis not present

## 2016-11-14 DIAGNOSIS — I420 Dilated cardiomyopathy: Secondary | ICD-10-CM | POA: Insufficient documentation

## 2016-11-14 LAB — HEPATIC FUNCTION PANEL
ALBUMIN: 4.1 g/dL (ref 3.6–5.1)
ALK PHOS: 85 U/L (ref 40–115)
ALT: 20 U/L (ref 9–46)
AST: 21 U/L (ref 10–35)
Bilirubin, Direct: 0.1 mg/dL (ref <=0.2)
Indirect Bilirubin: 0.4 mg/dL (ref 0.2–1.2)
TOTAL PROTEIN: 7.2 g/dL (ref 6.1–8.1)
Total Bilirubin: 0.5 mg/dL (ref 0.2–1.2)

## 2016-11-14 LAB — BASIC METABOLIC PANEL
BUN: 20 mg/dL (ref 7–25)
CHLORIDE: 104 mmol/L (ref 98–110)
CO2: 28 mmol/L (ref 20–31)
CREATININE: 0.89 mg/dL (ref 0.70–1.18)
Calcium: 8.8 mg/dL (ref 8.6–10.3)
Glucose, Bld: 123 mg/dL — ABNORMAL HIGH (ref 65–99)
POTASSIUM: 4.6 mmol/L (ref 3.5–5.3)
SODIUM: 139 mmol/L (ref 135–146)

## 2016-11-14 LAB — LIPID PANEL
Cholesterol: 98 mg/dL (ref <200)
HDL: 35 mg/dL — AB (ref >40)
LDL CALC: 41 mg/dL (ref <100)
TRIGLYCERIDES: 110 mg/dL (ref <150)
Total CHOL/HDL Ratio: 2.8 Ratio (ref <5.0)
VLDL: 22 mg/dL (ref <30)

## 2016-11-15 ENCOUNTER — Encounter: Payer: Self-pay | Admitting: *Deleted

## 2016-11-18 ENCOUNTER — Encounter (HOSPITAL_COMMUNITY)
Admission: RE | Admit: 2016-11-18 | Discharge: 2016-11-18 | Disposition: A | Discharge status: Home / Self Care | Payer: Self-pay | Source: Ambulatory Visit | Attending: Cardiology | Admitting: Cardiology

## 2016-11-19 ENCOUNTER — Encounter (HOSPITAL_COMMUNITY)
Admission: RE | Admit: 2016-11-19 | Discharge: 2016-11-19 | Disposition: A | Discharge status: Home / Self Care | Payer: Self-pay | Source: Ambulatory Visit | Attending: Cardiology | Admitting: Cardiology

## 2016-11-21 ENCOUNTER — Encounter (HOSPITAL_COMMUNITY): Payer: Self-pay

## 2016-11-25 ENCOUNTER — Encounter (HOSPITAL_COMMUNITY)
Admission: RE | Admit: 2016-11-25 | Discharge: 2016-11-25 | Disposition: A | Discharge status: Home / Self Care | Payer: Self-pay | Source: Ambulatory Visit | Attending: Cardiology | Admitting: Cardiology

## 2016-11-26 ENCOUNTER — Encounter: Payer: Self-pay | Admitting: Neurology

## 2016-11-26 ENCOUNTER — Encounter (HOSPITAL_COMMUNITY)
Admission: RE | Admit: 2016-11-26 | Discharge: 2016-11-26 | Disposition: A | Discharge status: Home / Self Care | Payer: Self-pay | Source: Ambulatory Visit | Attending: Cardiology | Admitting: Cardiology

## 2016-11-26 ENCOUNTER — Encounter: Payer: Self-pay | Admitting: Internal Medicine

## 2016-11-26 LAB — HM DIABETES EYE EXAM

## 2016-11-27 ENCOUNTER — Encounter: Payer: Self-pay | Admitting: Internal Medicine

## 2016-11-28 ENCOUNTER — Encounter (HOSPITAL_COMMUNITY)
Admission: RE | Admit: 2016-11-28 | Discharge: 2016-11-28 | Disposition: A | Discharge status: Home / Self Care | Payer: Self-pay | Source: Ambulatory Visit | Attending: Cardiology | Admitting: Cardiology

## 2016-11-29 ENCOUNTER — Encounter (HOSPITAL_COMMUNITY)
Admission: RE | Admit: 2016-11-29 | Discharge: 2016-11-29 | Disposition: A | Discharge status: Home / Self Care | Payer: Self-pay | Source: Ambulatory Visit | Attending: Cardiology | Admitting: Cardiology

## 2016-12-02 ENCOUNTER — Encounter (HOSPITAL_COMMUNITY): Payer: Self-pay

## 2016-12-03 ENCOUNTER — Encounter (HOSPITAL_COMMUNITY): Payer: Self-pay

## 2016-12-04 ENCOUNTER — Other Ambulatory Visit: Payer: Self-pay

## 2016-12-04 MED ORDER — OMEPRAZOLE 40 MG PO CPDR: 40.0000 mg | DELAYED_RELEASE_CAPSULE | Freq: Every day | ORAL | 3 refills | Status: DC

## 2016-12-05 ENCOUNTER — Encounter (HOSPITAL_COMMUNITY): Payer: Self-pay

## 2016-12-07 ENCOUNTER — Other Ambulatory Visit: Payer: Self-pay | Admitting: Internal Medicine

## 2016-12-09 ENCOUNTER — Encounter (HOSPITAL_COMMUNITY): Payer: Self-pay

## 2016-12-09 ENCOUNTER — Telehealth: Payer: Self-pay | Admitting: Internal Medicine

## 2016-12-09 ENCOUNTER — Encounter: Payer: Self-pay | Admitting: *Deleted

## 2016-12-09 DIAGNOSIS — E119 Type 2 diabetes mellitus without complications: Secondary | ICD-10-CM

## 2016-12-09 DIAGNOSIS — N320 Bladder-neck obstruction: Secondary | ICD-10-CM

## 2016-12-09 DIAGNOSIS — Z0001 Encounter for general adult medical examination with abnormal findings: Secondary | ICD-10-CM

## 2016-12-09 NOTE — Telephone Encounter (Signed)
Pt called request lab work put in a week prior to his appt on 12/26/2016. Please advise, pt has medicare but he said [employer] [place] should cover and he has been doing it for years.

## 2016-12-10 ENCOUNTER — Encounter (HOSPITAL_COMMUNITY)
Admission: RE | Admit: 2016-12-10 | Discharge: 2016-12-10 | Disposition: A | Discharge status: Home / Self Care | Payer: Self-pay | Source: Ambulatory Visit | Attending: Cardiology | Admitting: Cardiology

## 2016-12-10 NOTE — Telephone Encounter (Signed)
Ok, labs are ordered 

## 2016-12-11 NOTE — Telephone Encounter (Signed)
Left detail massage on vm inform pt. Advise to call our office back if there is any questions.

## 2016-12-12 ENCOUNTER — Other Ambulatory Visit: Payer: Self-pay | Admitting: Internal Medicine

## 2016-12-16 ENCOUNTER — Encounter (HOSPITAL_COMMUNITY): Payer: Self-pay

## 2016-12-16 DIAGNOSIS — Z955 Presence of coronary angioplasty implant and graft: Secondary | ICD-10-CM | POA: Insufficient documentation

## 2016-12-16 DIAGNOSIS — Z9889 Other specified postprocedural states: Secondary | ICD-10-CM | POA: Insufficient documentation

## 2016-12-16 DIAGNOSIS — I2510 Atherosclerotic heart disease of native coronary artery without angina pectoris: Secondary | ICD-10-CM | POA: Insufficient documentation

## 2016-12-17 ENCOUNTER — Other Ambulatory Visit: Payer: Self-pay | Admitting: Internal Medicine

## 2016-12-17 ENCOUNTER — Encounter (HOSPITAL_COMMUNITY): Payer: Self-pay

## 2016-12-17 MED ORDER — LEVETIRACETAM 1000 MG PO TABS: 1000.0000 mg | ORAL_TABLET | Freq: Two times a day (BID) | ORAL | 0 refills | Status: DC

## 2016-12-17 NOTE — Telephone Encounter (Signed)
Received request for keppra refill per Rosie Fate  Ok to let pt know, 90 days was refilled, but since he was last seen here jan 2017, pt to please make ROV for further refills

## 2016-12-17 NOTE — Telephone Encounter (Signed)
Called pt about Rx keppra been sent to the pharmacy and Dr. Jenny Reichmann instructions

## 2016-12-19 ENCOUNTER — Encounter (HOSPITAL_COMMUNITY): Payer: Self-pay

## 2016-12-23 ENCOUNTER — Encounter (HOSPITAL_COMMUNITY): Payer: Self-pay

## 2016-12-24 ENCOUNTER — Encounter (HOSPITAL_COMMUNITY): Payer: Self-pay

## 2016-12-25 ENCOUNTER — Encounter (HOSPITAL_COMMUNITY)
Admission: RE | Admit: 2016-12-25 | Discharge: 2016-12-25 | Disposition: A | Discharge status: Home / Self Care | Payer: Self-pay | Source: Ambulatory Visit | Attending: Cardiology | Admitting: Cardiology

## 2016-12-26 ENCOUNTER — Encounter: Payer: Self-pay | Admitting: Internal Medicine

## 2016-12-26 ENCOUNTER — Encounter (HOSPITAL_COMMUNITY)
Admission: RE | Admit: 2016-12-26 | Discharge: 2016-12-26 | Disposition: A | Discharge status: Home / Self Care | Payer: Self-pay | Source: Ambulatory Visit | Attending: Cardiology | Admitting: Cardiology

## 2016-12-30 ENCOUNTER — Encounter (HOSPITAL_COMMUNITY): Payer: Self-pay

## 2016-12-31 ENCOUNTER — Encounter (HOSPITAL_COMMUNITY): Payer: Self-pay

## 2017-01-02 ENCOUNTER — Encounter (HOSPITAL_COMMUNITY): Payer: Self-pay

## 2017-01-06 ENCOUNTER — Encounter (HOSPITAL_COMMUNITY): Payer: Self-pay

## 2017-01-07 ENCOUNTER — Encounter: Payer: Medicare Other | Admitting: Internal Medicine

## 2017-01-07 ENCOUNTER — Encounter (HOSPITAL_COMMUNITY): Payer: Self-pay

## 2017-01-08 ENCOUNTER — Encounter (HOSPITAL_COMMUNITY)
Admission: RE | Admit: 2017-01-08 | Discharge: 2017-01-08 | Disposition: A | Discharge status: Home / Self Care | Payer: Self-pay | Source: Ambulatory Visit | Attending: Cardiology | Admitting: Cardiology

## 2017-01-09 ENCOUNTER — Encounter (HOSPITAL_COMMUNITY): Payer: Self-pay

## 2017-01-11 ENCOUNTER — Other Ambulatory Visit: Payer: Self-pay | Admitting: Internal Medicine

## 2017-01-11 DIAGNOSIS — K2289 Other specified disease of esophagus: Secondary | ICD-10-CM

## 2017-01-11 DIAGNOSIS — K228 Other specified diseases of esophagus: Secondary | ICD-10-CM

## 2017-01-13 ENCOUNTER — Encounter: Payer: Self-pay | Admitting: Nurse Practitioner

## 2017-01-13 ENCOUNTER — Encounter (HOSPITAL_COMMUNITY): Payer: Medicare Other

## 2017-01-13 DIAGNOSIS — Z955 Presence of coronary angioplasty implant and graft: Secondary | ICD-10-CM | POA: Insufficient documentation

## 2017-01-13 DIAGNOSIS — I2510 Atherosclerotic heart disease of native coronary artery without angina pectoris: Secondary | ICD-10-CM | POA: Insufficient documentation

## 2017-01-13 DIAGNOSIS — Z9889 Other specified postprocedural states: Secondary | ICD-10-CM | POA: Diagnosis not present

## 2017-01-14 ENCOUNTER — Ambulatory Visit: Payer: Medicare Other | Admitting: Nurse Practitioner

## 2017-01-14 ENCOUNTER — Encounter (HOSPITAL_COMMUNITY): Payer: Medicare Other

## 2017-01-15 ENCOUNTER — Ambulatory Visit: Payer: Medicare Other | Admitting: Internal Medicine

## 2017-01-16 ENCOUNTER — Encounter (HOSPITAL_COMMUNITY): Payer: Medicare Other

## 2017-01-17 ENCOUNTER — Other Ambulatory Visit: Payer: Self-pay | Admitting: *Deleted

## 2017-01-17 DIAGNOSIS — I714 Abdominal aortic aneurysm, without rupture, unspecified: Secondary | ICD-10-CM

## 2017-01-20 ENCOUNTER — Encounter (HOSPITAL_COMMUNITY): Payer: Medicare Other

## 2017-01-21 ENCOUNTER — Encounter (HOSPITAL_COMMUNITY): Payer: Medicare Other

## 2017-01-22 ENCOUNTER — Other Ambulatory Visit: Payer: Self-pay | Admitting: Internal Medicine

## 2017-01-23 ENCOUNTER — Encounter (HOSPITAL_COMMUNITY)
Admission: RE | Admit: 2017-01-23 | Discharge: 2017-01-23 | Disposition: A | Discharge status: Home / Self Care | Payer: Medicare Other | Source: Ambulatory Visit | Attending: Cardiology | Admitting: Cardiology

## 2017-01-27 ENCOUNTER — Encounter (HOSPITAL_COMMUNITY): Payer: Medicare Other

## 2017-01-28 ENCOUNTER — Other Ambulatory Visit (INDEPENDENT_AMBULATORY_CARE_PROVIDER_SITE_OTHER): Payer: Medicare Other

## 2017-01-28 ENCOUNTER — Encounter (HOSPITAL_COMMUNITY)
Admission: RE | Admit: 2017-01-28 | Discharge: 2017-01-28 | Disposition: A | Discharge status: Home / Self Care | Payer: Medicare Other | Source: Ambulatory Visit | Attending: Cardiology | Admitting: Cardiology

## 2017-01-28 DIAGNOSIS — E119 Type 2 diabetes mellitus without complications: Secondary | ICD-10-CM | POA: Diagnosis not present

## 2017-01-28 DIAGNOSIS — Z0001 Encounter for general adult medical examination with abnormal findings: Secondary | ICD-10-CM

## 2017-01-28 DIAGNOSIS — N320 Bladder-neck obstruction: Secondary | ICD-10-CM | POA: Diagnosis not present

## 2017-01-28 LAB — CBC WITH DIFFERENTIAL/PLATELET
BASOS ABS: 0.1 10*3/uL (ref 0.0–0.1)
BASOS PCT: 0.8 % (ref 0.0–3.0)
EOS ABS: 0.3 10*3/uL (ref 0.0–0.7)
Eosinophils Relative: 3.9 % (ref 0.0–5.0)
HEMATOCRIT: 44.1 % (ref 39.0–52.0)
Hemoglobin: 14.6 g/dL (ref 13.0–17.0)
Lymphocytes Relative: 25.5 % (ref 12.0–46.0)
Lymphs Abs: 1.9 10*3/uL (ref 0.7–4.0)
MCHC: 33.1 g/dL (ref 30.0–36.0)
MCV: 88 fl (ref 78.0–100.0)
Monocytes Absolute: 0.9 10*3/uL (ref 0.1–1.0)
Monocytes Relative: 11.4 % (ref 3.0–12.0)
NEUTROS ABS: 4.4 10*3/uL (ref 1.4–7.7)
Neutrophils Relative %: 58.4 % (ref 43.0–77.0)
Platelets: 152 10*3/uL (ref 150.0–400.0)
RBC: 5.01 Mil/uL (ref 4.22–5.81)
RDW: 15.2 % (ref 11.5–15.5)
WBC: 7.5 10*3/uL (ref 4.0–10.5)

## 2017-01-28 LAB — HEPATIC FUNCTION PANEL
ALK PHOS: 93 U/L (ref 39–117)
ALT: 18 U/L (ref 0–53)
AST: 18 U/L (ref 0–37)
Albumin: 4.2 g/dL (ref 3.5–5.2)
BILIRUBIN DIRECT: 0.1 mg/dL (ref 0.0–0.3)
BILIRUBIN TOTAL: 0.5 mg/dL (ref 0.2–1.2)
TOTAL PROTEIN: 7 g/dL (ref 6.0–8.3)

## 2017-01-28 LAB — LIPID PANEL
CHOLESTEROL: 102 mg/dL (ref 0–200)
HDL: 37.1 mg/dL — ABNORMAL LOW (ref >39.00)
LDL Cholesterol: 43 mg/dL (ref 0–99)
NonHDL: 65.31
TRIGLYCERIDES: 113 mg/dL (ref 0.0–149.0)
Total CHOL/HDL Ratio: 3
VLDL: 22.6 mg/dL (ref 0.0–40.0)

## 2017-01-28 LAB — PSA: PSA: 2.13 ng/mL (ref 0.10–4.00)

## 2017-01-28 LAB — BASIC METABOLIC PANEL
BUN: 18 mg/dL (ref 6–23)
CHLORIDE: 102 meq/L (ref 96–112)
CO2: 29 mEq/L (ref 19–32)
Calcium: 9.2 mg/dL (ref 8.4–10.5)
Creatinine, Ser: 0.91 mg/dL (ref 0.40–1.50)
GFR: 87.35 mL/min (ref >60.00)
GLUCOSE: 150 mg/dL — AB (ref 70–99)
POTASSIUM: 4.8 meq/L (ref 3.5–5.1)
Sodium: 139 mEq/L (ref 135–145)

## 2017-01-28 LAB — URINALYSIS, ROUTINE W REFLEX MICROSCOPIC
Bilirubin Urine: NEGATIVE
Hgb urine dipstick: NEGATIVE
Ketones, ur: NEGATIVE
Leukocytes, UA: NEGATIVE
Nitrite: NEGATIVE
PH: 5.5 (ref 5.0–8.0)
RBC / HPF: NONE SEEN (ref 0–?)
SPECIFIC GRAVITY, URINE: 1.025 (ref 1.000–1.030)
Total Protein, Urine: NEGATIVE
URINE GLUCOSE: NEGATIVE
UROBILINOGEN UA: 0.2 (ref 0.0–1.0)

## 2017-01-28 LAB — MICROALBUMIN / CREATININE URINE RATIO
Creatinine,U: 136.4 mg/dL
MICROALB/CREAT RATIO: 0.7 mg/g (ref 0.0–30.0)
Microalb, Ur: 1 mg/dL (ref 0.0–1.9)

## 2017-01-28 LAB — HEMOGLOBIN A1C: Hgb A1c MFr Bld: 7.4 % — ABNORMAL HIGH (ref 4.6–6.5)

## 2017-01-28 LAB — TSH: TSH: 5.14 u[IU]/mL — ABNORMAL HIGH (ref 0.35–4.50)

## 2017-01-29 ENCOUNTER — Ambulatory Visit: Payer: Medicare Other | Admitting: Nurse Practitioner

## 2017-01-29 ENCOUNTER — Encounter: Payer: Self-pay | Admitting: Internal Medicine

## 2017-01-29 ENCOUNTER — Ambulatory Visit (INDEPENDENT_AMBULATORY_CARE_PROVIDER_SITE_OTHER): Payer: Medicare Other | Admitting: Internal Medicine

## 2017-01-29 VITALS — BP 128/74 | HR 74 | Ht 73.0 in | Wt 258.5 lb

## 2017-01-29 DIAGNOSIS — K219 Gastro-esophageal reflux disease without esophagitis: Secondary | ICD-10-CM | POA: Diagnosis not present

## 2017-01-29 DIAGNOSIS — E669 Obesity, unspecified: Secondary | ICD-10-CM

## 2017-01-29 DIAGNOSIS — K222 Esophageal obstruction: Secondary | ICD-10-CM

## 2017-01-29 DIAGNOSIS — I2510 Atherosclerotic heart disease of native coronary artery without angina pectoris: Secondary | ICD-10-CM | POA: Diagnosis not present

## 2017-01-29 MED ORDER — OMEPRAZOLE 40 MG PO CPDR: 40.0000 mg | DELAYED_RELEASE_CAPSULE | Freq: Every day | ORAL | 11 refills | Status: DC
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
## 2017-01-29 NOTE — Patient Instructions (Signed)
We have sent the following medications to your pharmacy for you to pick up at your convenience:  Omeprazole.  Please follow up in one year  

## 2017-01-30 ENCOUNTER — Encounter (HOSPITAL_COMMUNITY)
Admission: RE | Admit: 2017-01-30 | Discharge: 2017-01-30 | Disposition: A | Discharge status: Home / Self Care | Payer: Medicare Other | Source: Ambulatory Visit | Attending: Cardiology | Admitting: Cardiology

## 2017-01-31 ENCOUNTER — Encounter (HOSPITAL_COMMUNITY)
Admission: RE | Admit: 2017-01-31 | Discharge: 2017-01-31 | Disposition: A | Discharge status: Home / Self Care | Payer: Medicare Other | Source: Ambulatory Visit | Attending: Cardiology | Admitting: Cardiology

## 2017-02-01 ENCOUNTER — Other Ambulatory Visit: Payer: Self-pay | Admitting: Internal Medicine

## 2017-02-03 ENCOUNTER — Encounter: Payer: Self-pay | Admitting: Nurse Practitioner

## 2017-02-03 ENCOUNTER — Encounter (HOSPITAL_COMMUNITY)
Admission: RE | Admit: 2017-02-03 | Discharge: 2017-02-03 | Disposition: A | Discharge status: Home / Self Care | Payer: Medicare Other | Source: Ambulatory Visit | Attending: Cardiology | Admitting: Cardiology

## 2017-02-03 ENCOUNTER — Ambulatory Visit (INDEPENDENT_AMBULATORY_CARE_PROVIDER_SITE_OTHER): Payer: Medicare Other | Admitting: Nurse Practitioner

## 2017-02-03 VITALS — BP 104/55 | HR 67 | Wt 253.0 lb

## 2017-02-03 DIAGNOSIS — R569 Unspecified convulsions: Secondary | ICD-10-CM

## 2017-02-03 DIAGNOSIS — I10 Essential (primary) hypertension: Secondary | ICD-10-CM | POA: Diagnosis not present

## 2017-02-03 DIAGNOSIS — I609 Nontraumatic subarachnoid hemorrhage, unspecified: Secondary | ICD-10-CM

## 2017-02-03 DIAGNOSIS — E785 Hyperlipidemia, unspecified: Secondary | ICD-10-CM

## 2017-02-03 DIAGNOSIS — I2510 Atherosclerotic heart disease of native coronary artery without angina pectoris: Secondary | ICD-10-CM

## 2017-02-03 MED ORDER — LEVETIRACETAM 1000 MG PO TABS: 1000.0000 mg | ORAL_TABLET | Freq: Two times a day (BID) | ORAL | 3 refills | Status: DC

## 2017-02-03 NOTE — Patient Instructions (Addendum)
continue ASA 81mg  and lipitor for stroke and cardiac prevention. continue keppra 1000mg  bid. for seizure disorder, call for any seizure activity Continue to follow up with Dr. Jenny Reichmann for stroke risk factor modification.  Recommend maintain blood pressure goal around 130/80, todays reading 104/55 Stay well hydrated diabetes with hemoglobin A1c goal below 6.5% most recent 7.4 on 02/04/17 and lipids with LDL cholesterol goal below 70 mg/dL.Most recent LDL 43  Continue to check BP and glucose at home Continue cardiac rehabilitation Follow up yearly
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
## 2017-02-04 ENCOUNTER — Ambulatory Visit (INDEPENDENT_AMBULATORY_CARE_PROVIDER_SITE_OTHER): Payer: Medicare Other | Admitting: Internal Medicine

## 2017-02-04 ENCOUNTER — Encounter (HOSPITAL_COMMUNITY): Payer: Medicare Other

## 2017-02-04 ENCOUNTER — Encounter: Payer: Self-pay | Admitting: Internal Medicine

## 2017-02-04 DIAGNOSIS — I2510 Atherosclerotic heart disease of native coronary artery without angina pectoris: Secondary | ICD-10-CM

## 2017-02-04 DIAGNOSIS — E7800 Pure hypercholesterolemia, unspecified: Secondary | ICD-10-CM | POA: Diagnosis not present

## 2017-02-04 DIAGNOSIS — E119 Type 2 diabetes mellitus without complications: Secondary | ICD-10-CM | POA: Diagnosis not present

## 2017-02-04 DIAGNOSIS — E039 Hypothyroidism, unspecified: Secondary | ICD-10-CM | POA: Diagnosis not present

## 2017-02-04 DIAGNOSIS — I10 Essential (primary) hypertension: Secondary | ICD-10-CM | POA: Diagnosis not present

## 2017-02-04 MED ORDER — ATORVASTATIN CALCIUM 80 MG PO TABS: 80.0000 mg | ORAL_TABLET | Freq: Every day | ORAL | 3 refills | Status: DC

## 2017-02-04 MED ORDER — NITROGLYCERIN 0.4 MG SL SUBL: 0.4000 mg | SUBLINGUAL_TABLET | SUBLINGUAL | 12 refills | Status: DC | PRN

## 2017-02-04 MED ORDER — METFORMIN HCL 1000 MG PO TABS: ORAL_TABLET | ORAL | 3 refills | Status: DC

## 2017-02-04 MED ORDER — CARVEDILOL 3.125 MG PO TABS: ORAL_TABLET | ORAL | 3 refills | Status: DC

## 2017-02-04 MED ORDER — LISINOPRIL 2.5 MG PO TABS
2.5000 mg | ORAL_TABLET | Freq: Every day | ORAL | 3 refills | Status: DC
Start: 2017-02-04 — End: 2018-04-27

## 2017-02-04 MED ORDER — PIOGLITAZONE HCL 30 MG PO TABS: 30.0000 mg | ORAL_TABLET | Freq: Every day | ORAL | 3 refills | Status: DC

## 2017-02-04 MED ORDER — LEVOTHYROXINE SODIUM 137 MCG PO TABS: 137.0000 ug | ORAL_TABLET | Freq: Every day | ORAL | 3 refills | Status: DC

## 2017-02-04 MED ORDER — LEVETIRACETAM 1000 MG PO TABS: 1000.0000 mg | ORAL_TABLET | Freq: Two times a day (BID) | ORAL | 3 refills | Status: DC

## 2017-02-04 MED ORDER — LINAGLIPTIN 5 MG PO TABS: 5.0000 mg | ORAL_TABLET | Freq: Every day | ORAL | 3 refills | Status: DC

## 2017-02-04 MED ORDER — OMEPRAZOLE 40 MG PO CPDR: 40.0000 mg | DELAYED_RELEASE_CAPSULE | Freq: Every day | ORAL | 3 refills | Status: DC

## 2017-02-04 MED ORDER — LEVOTHYROXINE SODIUM 150 MCG PO TABS: 150.0000 ug | ORAL_TABLET | Freq: Every day | ORAL | 3 refills | Status: DC

## 2017-02-04 NOTE — Progress Notes (Signed)
   Subjective:    Patient ID: Allen Myers, male    DOB: Mar 10, 1946, 71 y.o.   MRN: [id number]  HPI  Here for yearly f/u;  Overall doing ok;  Pt denies Chest pain, worsening SOB, DOE, wheezing, orthopnea, PND, worsening LE edema, palpitations, dizziness or syncope.  Pt denies neurological change such as new headache, facial or extremity weakness.  Pt denies polydipsia, polyuria, or low sugar symptoms. Pt states overall good compliance with treatment and medications, good tolerability, and has been trying to follow appropriate diet.  Pt denies worsening depressive symptoms, suicidal ideation or panic. No fever, night sweats, wt loss, loss of appetite, or other constitutional symptoms.  Pt states good ability with ADL's, has low fall risk, home safety reviewed and adequate, no other significant changes in hearing or vision, and  occasionally active with exercise at the gym several times per wk.  Denies hyper or hypo thyroid symptoms such as voice, skin or hair change.  Did see Gi recently, doing well       Review of Systems     Objective:   Physical Exam BP 120/66   Pulse 61   Ht 6\' 1"  (1.854 m)   Wt 254 lb (115.2 kg)   SpO2 99%   BMI 33.51 kg/m       Assessment & Plan:

## 2017-02-04 NOTE — Assessment & Plan Note (Signed)
Mild uncontrolled, for incresae levothryox to 150 qd, fu lab next visit

## 2017-02-04 NOTE — Progress Notes (Signed)
Pre visit review using our clinic review tool, if applicable. No additional management support is needed unless otherwise documented below in the visit note. 

## 2017-02-04 NOTE — Patient Instructions (Signed)
Please continue all other medications as before, and refills have been done if requested.  Please have the pharmacy call with any other refills you may need.  Please continue your efforts at being more active, low cholesterol diet, and weight control.  You are otherwise up to date with prevention measures today.  Please keep your appointments with your specialists as you may have planned  Please return in 3 months, or sooner if needed, with Lab testing done 3-5 days before  

## 2017-02-04 NOTE — Assessment & Plan Note (Signed)
Mild incresae assoc with incresaed calories and wt gain, for better diet, cont gym workouts, decrease calories, f/u next visit, cont same tx for now

## 2017-02-04 NOTE — Assessment & Plan Note (Signed)
stable overall by history and exam, recent data reviewed with pt, and pt to continue medical treatment as before,  to f/u any worsening symptoms or concerns BP Readings from Last 3 Encounters:  02/04/17 120/66  02/03/17 (!) 104/55  01/29/17 128/74

## 2017-02-04 NOTE — Assessment & Plan Note (Signed)
stable overall by history and exam, recent data reviewed with pt, and pt to continue medical treatment as before,  to f/u any worsening symptoms or concerns Lab Results  Component Value Date   LDLCALC 43 01/28/2017

## 2017-02-06 ENCOUNTER — Encounter (HOSPITAL_COMMUNITY): Payer: Medicare Other

## 2017-02-10 ENCOUNTER — Encounter (HOSPITAL_COMMUNITY)
Admission: RE | Admit: 2017-02-10 | Discharge: 2017-02-10 | Disposition: A | Discharge status: Home / Self Care | Payer: Medicare Other | Source: Ambulatory Visit | Attending: Cardiology | Admitting: Cardiology

## 2017-02-11 ENCOUNTER — Encounter (HOSPITAL_COMMUNITY): Payer: Medicare Other

## 2017-02-11 DIAGNOSIS — I2510 Atherosclerotic heart disease of native coronary artery without angina pectoris: Secondary | ICD-10-CM | POA: Insufficient documentation

## 2017-02-11 DIAGNOSIS — Z955 Presence of coronary angioplasty implant and graft: Secondary | ICD-10-CM | POA: Insufficient documentation

## 2017-02-11 DIAGNOSIS — Z9889 Other specified postprocedural states: Secondary | ICD-10-CM | POA: Insufficient documentation

## 2017-02-12 ENCOUNTER — Ambulatory Visit (HOSPITAL_COMMUNITY)
Admission: RE | Admit: 2017-02-12 | Discharge: 2017-02-12 | Disposition: A | Discharge status: Home / Self Care | Payer: Medicare Other | Source: Ambulatory Visit | Attending: Cardiology | Admitting: Cardiology

## 2017-02-12 DIAGNOSIS — I714 Abdominal aortic aneurysm, without rupture, unspecified: Secondary | ICD-10-CM

## 2017-02-13 ENCOUNTER — Encounter (HOSPITAL_COMMUNITY): Payer: Medicare Other

## 2017-02-17 ENCOUNTER — Encounter (HOSPITAL_COMMUNITY): Payer: Medicare Other

## 2017-02-18 ENCOUNTER — Encounter (HOSPITAL_COMMUNITY): Payer: Medicare Other

## 2017-02-20 ENCOUNTER — Encounter (HOSPITAL_COMMUNITY): Payer: Medicare Other

## 2017-02-24 ENCOUNTER — Encounter (HOSPITAL_COMMUNITY): Payer: Medicare Other

## 2017-02-25 ENCOUNTER — Encounter (HOSPITAL_COMMUNITY): Payer: Medicare Other

## 2017-02-27 ENCOUNTER — Encounter (HOSPITAL_COMMUNITY): Payer: Medicare Other

## 2017-03-03 ENCOUNTER — Encounter (HOSPITAL_COMMUNITY): Payer: Medicare Other

## 2017-03-04 ENCOUNTER — Encounter (HOSPITAL_COMMUNITY): Payer: Medicare Other

## 2017-03-06 ENCOUNTER — Encounter (HOSPITAL_COMMUNITY): Payer: Medicare Other

## 2017-03-11 ENCOUNTER — Encounter (HOSPITAL_COMMUNITY)
Admission: RE | Admit: 2017-03-11 | Discharge: 2017-03-11 | Disposition: A | Discharge status: Home / Self Care | Payer: Medicare Other | Source: Ambulatory Visit | Attending: Cardiology | Admitting: Cardiology

## 2017-03-12 ENCOUNTER — Encounter (HOSPITAL_COMMUNITY)
Admission: RE | Admit: 2017-03-12 | Discharge: 2017-03-12 | Disposition: A | Discharge status: Home / Self Care | Payer: Self-pay | Source: Ambulatory Visit | Attending: Cardiology | Admitting: Cardiology

## 2017-03-13 ENCOUNTER — Encounter (HOSPITAL_COMMUNITY): Payer: Medicare Other

## 2017-03-17 ENCOUNTER — Encounter (HOSPITAL_COMMUNITY)
Admission: RE | Admit: 2017-03-17 | Discharge: 2017-03-17 | Disposition: A | Discharge status: Home / Self Care | Payer: Self-pay | Source: Ambulatory Visit | Attending: Cardiology | Admitting: Cardiology

## 2017-03-17 DIAGNOSIS — I2510 Atherosclerotic heart disease of native coronary artery without angina pectoris: Secondary | ICD-10-CM | POA: Insufficient documentation

## 2017-03-17 DIAGNOSIS — Z955 Presence of coronary angioplasty implant and graft: Secondary | ICD-10-CM | POA: Insufficient documentation

## 2017-03-17 DIAGNOSIS — Z9889 Other specified postprocedural states: Secondary | ICD-10-CM | POA: Insufficient documentation

## 2017-03-18 ENCOUNTER — Encounter (HOSPITAL_COMMUNITY)
Admission: RE | Admit: 2017-03-18 | Discharge: 2017-03-18 | Disposition: A | Discharge status: Home / Self Care | Payer: Self-pay | Source: Ambulatory Visit | Attending: Cardiology | Admitting: Cardiology

## 2017-03-19 ENCOUNTER — Encounter (HOSPITAL_COMMUNITY)
Admission: RE | Admit: 2017-03-19 | Discharge: 2017-03-19 | Disposition: A | Discharge status: Home / Self Care | Payer: Self-pay | Source: Ambulatory Visit | Attending: Cardiology | Admitting: Cardiology

## 2017-03-20 ENCOUNTER — Encounter (HOSPITAL_COMMUNITY): Payer: Self-pay

## 2017-03-24 ENCOUNTER — Encounter (HOSPITAL_COMMUNITY): Payer: Self-pay

## 2017-03-25 ENCOUNTER — Encounter (HOSPITAL_COMMUNITY)
Admission: RE | Admit: 2017-03-25 | Discharge: 2017-03-25 | Disposition: A | Discharge status: Home / Self Care | Payer: Self-pay | Source: Ambulatory Visit | Attending: Cardiology | Admitting: Cardiology

## 2017-03-27 ENCOUNTER — Encounter (HOSPITAL_COMMUNITY): Payer: Self-pay

## 2017-03-28 ENCOUNTER — Encounter (HOSPITAL_COMMUNITY)
Admission: RE | Admit: 2017-03-28 | Discharge: 2017-03-28 | Disposition: A | Discharge status: Home / Self Care | Payer: Self-pay | Source: Ambulatory Visit | Attending: Cardiology | Admitting: Cardiology

## 2017-03-31 ENCOUNTER — Encounter (HOSPITAL_COMMUNITY): Payer: Self-pay

## 2017-04-01 ENCOUNTER — Encounter (HOSPITAL_COMMUNITY): Payer: Self-pay

## 2017-04-03 ENCOUNTER — Encounter (HOSPITAL_COMMUNITY): Payer: Self-pay

## 2017-04-07 ENCOUNTER — Encounter (HOSPITAL_COMMUNITY): Payer: Self-pay

## 2017-04-08 ENCOUNTER — Encounter (HOSPITAL_COMMUNITY): Payer: Self-pay

## 2017-04-10 ENCOUNTER — Encounter (HOSPITAL_COMMUNITY): Payer: Self-pay

## 2017-04-14 ENCOUNTER — Encounter (HOSPITAL_COMMUNITY): Payer: Self-pay

## 2017-04-14 DIAGNOSIS — Z955 Presence of coronary angioplasty implant and graft: Secondary | ICD-10-CM | POA: Insufficient documentation

## 2017-04-14 DIAGNOSIS — Z9889 Other specified postprocedural states: Secondary | ICD-10-CM | POA: Insufficient documentation

## 2017-04-14 DIAGNOSIS — I2510 Atherosclerotic heart disease of native coronary artery without angina pectoris: Secondary | ICD-10-CM | POA: Insufficient documentation

## 2017-04-15 ENCOUNTER — Encounter (HOSPITAL_COMMUNITY): Payer: Self-pay

## 2017-04-17 ENCOUNTER — Encounter (HOSPITAL_COMMUNITY)
Admission: RE | Admit: 2017-04-17 | Discharge: 2017-04-17 | Disposition: A | Discharge status: Home / Self Care | Payer: Self-pay | Source: Ambulatory Visit | Attending: Cardiology | Admitting: Cardiology

## 2017-04-21 ENCOUNTER — Encounter (HOSPITAL_COMMUNITY): Payer: Self-pay

## 2017-04-22 ENCOUNTER — Encounter (HOSPITAL_COMMUNITY): Payer: Self-pay

## 2017-04-24 ENCOUNTER — Encounter (HOSPITAL_COMMUNITY): Payer: Self-pay

## 2017-04-28 ENCOUNTER — Encounter (HOSPITAL_COMMUNITY): Payer: Self-pay

## 2017-04-29 ENCOUNTER — Encounter (HOSPITAL_COMMUNITY)
Admission: RE | Admit: 2017-04-29 | Discharge: 2017-04-29 | Disposition: A | Discharge status: Home / Self Care | Payer: Self-pay | Source: Ambulatory Visit | Attending: Cardiology | Admitting: Cardiology

## 2017-05-01 ENCOUNTER — Encounter (HOSPITAL_COMMUNITY): Payer: Self-pay

## 2017-05-02 ENCOUNTER — Encounter (HOSPITAL_COMMUNITY)
Admission: RE | Admit: 2017-05-02 | Discharge: 2017-05-02 | Disposition: A | Discharge status: Home / Self Care | Payer: Self-pay | Source: Ambulatory Visit | Attending: Cardiology | Admitting: Cardiology

## 2017-05-05 ENCOUNTER — Encounter (HOSPITAL_COMMUNITY)
Admission: RE | Admit: 2017-05-05 | Discharge: 2017-05-05 | Disposition: A | Discharge status: Home / Self Care | Payer: Self-pay | Source: Ambulatory Visit | Attending: Cardiology | Admitting: Cardiology

## 2017-05-06 ENCOUNTER — Encounter (HOSPITAL_COMMUNITY)
Admission: RE | Admit: 2017-05-06 | Discharge: 2017-05-06 | Disposition: A | Discharge status: Home / Self Care | Payer: Self-pay | Source: Ambulatory Visit | Attending: Cardiology | Admitting: Cardiology

## 2017-05-08 ENCOUNTER — Encounter (HOSPITAL_COMMUNITY): Payer: Self-pay

## 2017-05-12 ENCOUNTER — Encounter (HOSPITAL_COMMUNITY): Payer: Self-pay

## 2017-05-13 ENCOUNTER — Encounter (HOSPITAL_COMMUNITY): Payer: Self-pay

## 2017-05-15 ENCOUNTER — Encounter (HOSPITAL_COMMUNITY)
Admission: RE | Admit: 2017-05-15 | Discharge: 2017-05-15 | Disposition: A | Discharge status: Home / Self Care | Payer: Self-pay | Source: Ambulatory Visit | Attending: Cardiology | Admitting: Cardiology

## 2017-05-15 ENCOUNTER — Telehealth: Payer: Self-pay | Admitting: *Deleted

## 2017-05-15 DIAGNOSIS — Z9889 Other specified postprocedural states: Secondary | ICD-10-CM | POA: Insufficient documentation

## 2017-05-15 DIAGNOSIS — Z955 Presence of coronary angioplasty implant and graft: Secondary | ICD-10-CM | POA: Insufficient documentation

## 2017-05-15 DIAGNOSIS — I2510 Atherosclerotic heart disease of native coronary artery without angina pectoris: Secondary | ICD-10-CM | POA: Insufficient documentation

## 2017-05-15 NOTE — Telephone Encounter (Signed)
Maintenance program orders faxed.

## 2017-05-16 ENCOUNTER — Other Ambulatory Visit (INDEPENDENT_AMBULATORY_CARE_PROVIDER_SITE_OTHER): Payer: Medicare Other

## 2017-05-16 DIAGNOSIS — E119 Type 2 diabetes mellitus without complications: Secondary | ICD-10-CM | POA: Diagnosis not present

## 2017-05-16 DIAGNOSIS — E039 Hypothyroidism, unspecified: Secondary | ICD-10-CM

## 2017-05-16 LAB — BASIC METABOLIC PANEL
BUN: 19 mg/dL (ref 6–23)
CHLORIDE: 101 meq/L (ref 96–112)
CO2: 29 meq/L (ref 19–32)
Calcium: 9.2 mg/dL (ref 8.4–10.5)
Creatinine, Ser: 0.91 mg/dL (ref 0.40–1.50)
GFR: 87.28 mL/min (ref >60.00)
GLUCOSE: 148 mg/dL — AB (ref 70–99)
POTASSIUM: 4.3 meq/L (ref 3.5–5.1)
Sodium: 137 mEq/L (ref 135–145)

## 2017-05-16 LAB — HEPATIC FUNCTION PANEL
ALT: 19 U/L (ref 0–53)
AST: 16 U/L (ref 0–37)
Albumin: 4.1 g/dL (ref 3.5–5.2)
Alkaline Phosphatase: 91 U/L (ref 39–117)
Bilirubin, Direct: 0.1 mg/dL (ref 0.0–0.3)
TOTAL PROTEIN: 7 g/dL (ref 6.0–8.3)
Total Bilirubin: 0.6 mg/dL (ref 0.2–1.2)

## 2017-05-16 LAB — LIPID PANEL
CHOLESTEROL: 94 mg/dL (ref 0–200)
HDL: 35.4 mg/dL — ABNORMAL LOW (ref >39.00)
LDL Cholesterol: 30 mg/dL (ref 0–99)
NonHDL: 58.32
TRIGLYCERIDES: 143 mg/dL (ref 0.0–149.0)
Total CHOL/HDL Ratio: 3
VLDL: 28.6 mg/dL (ref 0.0–40.0)

## 2017-05-16 LAB — HEMOGLOBIN A1C: HEMOGLOBIN A1C: 7.1 % — AB (ref 4.6–6.5)

## 2017-05-16 LAB — TSH: TSH: 1.1 u[IU]/mL (ref 0.35–4.50)

## 2017-05-19 ENCOUNTER — Encounter (HOSPITAL_COMMUNITY): Payer: Self-pay

## 2017-05-20 ENCOUNTER — Encounter (HOSPITAL_COMMUNITY): Payer: Self-pay

## 2017-05-20 ENCOUNTER — Encounter: Payer: Self-pay | Admitting: Internal Medicine

## 2017-05-20 ENCOUNTER — Ambulatory Visit (INDEPENDENT_AMBULATORY_CARE_PROVIDER_SITE_OTHER): Payer: Medicare Other | Admitting: Internal Medicine

## 2017-05-20 VITALS — BP 124/68 | HR 65 | Ht 73.0 in | Wt 258.0 lb

## 2017-05-20 DIAGNOSIS — E119 Type 2 diabetes mellitus without complications: Secondary | ICD-10-CM

## 2017-05-20 DIAGNOSIS — E7800 Pure hypercholesterolemia, unspecified: Secondary | ICD-10-CM | POA: Diagnosis not present

## 2017-05-20 DIAGNOSIS — I10 Essential (primary) hypertension: Secondary | ICD-10-CM | POA: Diagnosis not present

## 2017-05-20 DIAGNOSIS — I2510 Atherosclerotic heart disease of native coronary artery without angina pectoris: Secondary | ICD-10-CM | POA: Diagnosis not present

## 2017-05-20 NOTE — Patient Instructions (Signed)
Please continue all other medications as before, and refills have been done if requested.  Please have the pharmacy call with any other refills you may need.  Please continue your efforts at being more active, low cholesterol diet, and weight control..  Please keep your appointments with your specialists as you may have planned  Please return in 6 months, or sooner if needed, with Lab testing done 3-5 days before  

## 2017-05-20 NOTE — Progress Notes (Signed)
Subjective:    Patient ID: Allen Myers, male    DOB: 1945-10-28, 71 y.o.   MRN: [id number]  HPI  Here to f/u; overall doing ok,  Pt denies chest pain, increasing sob or doe, wheezing, orthopnea, PND, increased LE swelling, palpitations, dizziness or syncope.  Pt denies new neurological symptoms such as new headache, or facial or extremity weakness or numbness.  Pt denies polydipsia, polyuria, or low sugar episode.  Pt states overall good compliance with meds, mostly trying to follow appropriate diet, with wt overall stable,  but little exercise however.  No new complaints.  Does c/o ongoing fatigue, but denies signficant daytime hypersomnolence.   Pt denies fever, wt loss, night sweats, loss of appetite, or other constitutional symptoms Past Medical History:  Diagnosis Date  . Depression   . Erectile dysfunction   . History of colon polyps   . History of diverticulitis   . Hyperlipidemia   . HYPERTENSION   . Hypothyroidism   . Kidney stones    "passed it"  . SAH (subarachnoid hemorrhage) ([place])    Following syncopal episode  . SDH (subdural hematoma) ([facility])    Following syncopal episode  . Seizures ([place])    08/2014 - controlled  . Sleep apnea    "mild"  does not use CPAP  . Sleep apnea in adult   . Type II diabetes mellitus ([place])    type 2   Past Surgical History:  Procedure Laterality Date  . COLONOSCOPY  2014   polyp  . CORONARY ANGIOPLASTY WITH STENT PLACEMENT  05/11/2014   "1"  . CRANIOTOMY Left 09/12/2014   Procedure: CRANIOTOMY HEMATOMA EVACUATION SUBDURAL;  Surgeon: Erline Levine, MD;  Location: [place]UR[facility] ORS;  Service: Neurosurgery;  Laterality: Left;  . FRACTIONAL FLOW RESERVE WIRE  05/11/2014   Procedure: FRACTIONAL FLOW RESERVE WIRE;  Surgeon: [name], MD;  Location[facility] CATH LAB;  Service: Cardiovascular;;  . LEFT HEART CATHETERIZATION WITH CORONARY ANGIOGRAM N/A 05/11/2014   Procedure: LEFT HEART CATHETERIZATION WITH CORONARY ANGIOGRAM;  Surgeon: [name], MD;  Location: [facility] CATH LAB;  Service: Cardiovascular;  Laterality: N/A;  . POLYPECTOMY      reports that he quit smoking about 33 years ago. His smoking use included Cigarettes. He has a 30.00 pack-year smoking history. He has never used smokeless tobacco. He reports that he does not drink alcohol or use drugs. family history includes Ataxia in his maternal grandmother; Cancer in his brother and cousin; Dementia in his maternal grandmother; Heart disease in his father and mother; Lung cancer in his father. No Known Allergies Current Outpatient Prescriptions on File Prior to Visit  Medication Sig Dispense Refill  . aspirin EC 81 MG tablet Take 81 mg by mouth daily.    Marland Kitchen atorvastatin (LIPITOR) 80 MG tablet Take 1 tablet (80 mg total) by mouth daily. 90 tablet 3  . carvedilol (COREG) 3.125 MG tablet TAKE 1 TABLET BY MOUTH TWICE A DAY WITH A MEAL 180 tablet 3  . levETIRAcetam (KEPPRA) 1000 MG tablet Take 1 tablet (1,000 mg total) by mouth 2 (two) times daily. 180 tablet 3  . levothyroxine (SYNTHROID, LEVOTHROID) 150 MCG tablet Take 1 tablet (150 mcg total) by mouth daily. 90 tablet 3  . linagliptin (TRADJENTA) 5 MG TABS tablet Take 1 tablet (5 mg total) by mouth daily. 90 tablet 3  . lisinopril (PRINIVIL,ZESTRIL) 2.5 MG tablet Take 1 tablet (2.5 mg total) by mouth daily. 90 tablet 3  .  metFORMIN (GLUCOPHAGE) 1000 MG tablet TAKE 1 TABLET BY MOUTH TWICE A DAY WITH A MEAL 180 tablet 3  . nitroGLYCERIN (NITROSTAT) 0.4 MG SL tablet Place 1 tablet (0.4 mg total) under the tongue every 5 (five) minutes as needed for chest pain. 25 tablet 12  . omeprazole (PRILOSEC) 40 MG capsule Take 1 capsule (40 mg total) by mouth daily. 90 capsule 3  . ONE TOUCH ULTRA TEST test strip USE TO CHECK BLOOD SUGAR TWICE A DAY AS DIRECTED 100 each 0  . pioglitazone (ACTOS) 30 MG tablet Take 1 tablet (30 mg total) by mouth daily. 90 tablet 3   No current facility-administered medications on file prior to visit.     Review of Systems  Constitutional: Negative for other unusual diaphoresis or sweats HENT: Negative for ear discharge or swelling Eyes: Negative for other worsening visual disturbances Respiratory: Negative for stridor or other swelling  Gastrointestinal: Negative for worsening distension or other blood Genitourinary: Negative for retention or other urinary change Musculoskeletal: Negative for other MSK pain or swelling Skin: Negative for color change or other new lesions Neurological: Negative for worsening tremors and other numbness  Psychiatric/Behavioral: Negative for worsening agitation or other fatigue All other system neg per pt    Objective:   Physical Exam BP 124/68   Pulse 65   Ht 6\' 1"  (1.854 m)   Wt 258 lb (117 kg)   SpO2 98%   BMI 34.04 kg/m  VS noted, not ill appearing Constitutional: Pt appears in NAD HENT: Head: NCAT.  Right Ear: External ear normal.  Left Ear: External ear normal.  Eyes: . Pupils are equal, round, and reactive to light. Conjunctivae and EOM are normal Nose: without d/c or deformity Neck: Neck supple. Gross normal ROM Cardiovascular: Normal rate and regular rhythm.   Pulmonary/Chest: Effort normal and breath sounds without rales or wheezing.  Neurological: Pt is alert. At baseline orientation, motor grossly intact Skin: Skin is warm. No rashes, other new lesions, no LE edema Psychiatric: Pt behavior is normal without agitation  No other exam findings Lab Results  Component Value Date   WBC 7.5 01/28/2017   HGB 14.6 01/28/2017   HCT 44.1 01/28/2017   PLT 152.0 01/28/2017   GLUCOSE 148 (H) 05/16/2017   CHOL 94 05/16/2017   TRIG 143.0 05/16/2017   HDL 35.40 (L) 05/16/2017   LDLDIRECT 85.7 06/11/2013   LDLCALC 30 05/16/2017   ALT 19 05/16/2017   AST 16 05/16/2017   NA 137 05/16/2017   K 4.3 05/16/2017   CL 101 05/16/2017   CREATININE 0.91 05/16/2017   BUN 19 05/16/2017   CO2 29 05/16/2017   TSH 1.10 05/16/2017   PSA 2.13  01/28/2017   INR 1.07 09/06/2014   HGBA1C 7.1 (H) 05/16/2017   MICROALBUR 1.0 01/28/2017      Assessment & Plan:

## 2017-05-22 ENCOUNTER — Encounter (HOSPITAL_COMMUNITY)
Admission: RE | Admit: 2017-05-22 | Discharge: 2017-05-22 | Disposition: A | Discharge status: Home / Self Care | Payer: Self-pay | Source: Ambulatory Visit | Attending: Cardiology | Admitting: Cardiology

## 2017-05-23 ENCOUNTER — Encounter (HOSPITAL_COMMUNITY)
Admission: RE | Admit: 2017-05-23 | Discharge: 2017-05-23 | Disposition: A | Discharge status: Home / Self Care | Payer: Self-pay | Source: Ambulatory Visit | Attending: Cardiology | Admitting: Cardiology

## 2017-05-26 ENCOUNTER — Encounter (HOSPITAL_COMMUNITY): Payer: Self-pay

## 2017-05-27 ENCOUNTER — Encounter (HOSPITAL_COMMUNITY): Payer: Self-pay

## 2017-05-29 ENCOUNTER — Encounter (HOSPITAL_COMMUNITY): Payer: Self-pay

## 2017-06-02 ENCOUNTER — Encounter (HOSPITAL_COMMUNITY)
Admission: RE | Admit: 2017-06-02 | Discharge: 2017-06-02 | Disposition: A | Discharge status: Home / Self Care | Payer: Self-pay | Source: Ambulatory Visit | Attending: Cardiology | Admitting: Cardiology

## 2017-06-03 ENCOUNTER — Encounter (HOSPITAL_COMMUNITY)
Admission: RE | Admit: 2017-06-03 | Discharge: 2017-06-03 | Disposition: A | Discharge status: Home / Self Care | Payer: Self-pay | Source: Ambulatory Visit | Attending: Cardiology | Admitting: Cardiology

## 2017-06-05 ENCOUNTER — Encounter (HOSPITAL_COMMUNITY)
Admission: RE | Admit: 2017-06-05 | Discharge: 2017-06-05 | Disposition: A | Discharge status: Home / Self Care | Payer: Self-pay | Source: Ambulatory Visit | Attending: Cardiology | Admitting: Cardiology

## 2017-06-09 ENCOUNTER — Encounter (HOSPITAL_COMMUNITY)
Admission: RE | Admit: 2017-06-09 | Discharge: 2017-06-09 | Disposition: A | Discharge status: Home / Self Care | Payer: Self-pay | Source: Ambulatory Visit | Attending: Cardiology | Admitting: Cardiology

## 2017-06-10 ENCOUNTER — Encounter (HOSPITAL_COMMUNITY)
Admission: RE | Admit: 2017-06-10 | Discharge: 2017-06-10 | Disposition: A | Discharge status: Home / Self Care | Payer: Self-pay | Source: Ambulatory Visit | Attending: Cardiology | Admitting: Cardiology

## 2017-06-12 ENCOUNTER — Encounter (HOSPITAL_COMMUNITY): Payer: Self-pay

## 2017-06-12 ENCOUNTER — Encounter: Payer: Self-pay | Admitting: Internal Medicine

## 2017-06-13 ENCOUNTER — Encounter (HOSPITAL_COMMUNITY)
Admission: RE | Admit: 2017-06-13 | Discharge: 2017-06-13 | Disposition: A | Discharge status: Home / Self Care | Payer: Self-pay | Source: Ambulatory Visit | Attending: Cardiology | Admitting: Cardiology

## 2017-06-17 ENCOUNTER — Ambulatory Visit: Payer: Medicare Other

## 2017-06-17 ENCOUNTER — Encounter (HOSPITAL_COMMUNITY)
Admission: RE | Admit: 2017-06-17 | Discharge: 2017-06-17 | Disposition: A | Discharge status: Home / Self Care | Payer: Self-pay | Source: Ambulatory Visit | Attending: Cardiology | Admitting: Cardiology

## 2017-06-17 DIAGNOSIS — Z9889 Other specified postprocedural states: Secondary | ICD-10-CM | POA: Insufficient documentation

## 2017-06-17 DIAGNOSIS — I2510 Atherosclerotic heart disease of native coronary artery without angina pectoris: Secondary | ICD-10-CM | POA: Insufficient documentation

## 2017-06-17 DIAGNOSIS — Z955 Presence of coronary angioplasty implant and graft: Secondary | ICD-10-CM | POA: Insufficient documentation

## 2017-06-19 ENCOUNTER — Encounter (HOSPITAL_COMMUNITY)
Admission: RE | Admit: 2017-06-19 | Discharge: 2017-06-19 | Disposition: A | Discharge status: Home / Self Care | Payer: Self-pay | Source: Ambulatory Visit | Attending: Cardiology | Admitting: Cardiology

## 2017-06-20 ENCOUNTER — Encounter (HOSPITAL_COMMUNITY)
Admission: RE | Admit: 2017-06-20 | Discharge: 2017-06-20 | Disposition: A | Discharge status: Home / Self Care | Payer: Self-pay | Source: Ambulatory Visit | Attending: Cardiology | Admitting: Cardiology

## 2017-06-23 ENCOUNTER — Encounter (HOSPITAL_COMMUNITY)
Admission: RE | Admit: 2017-06-23 | Discharge: 2017-06-23 | Disposition: A | Discharge status: Home / Self Care | Payer: Self-pay | Source: Ambulatory Visit | Attending: Cardiology | Admitting: Cardiology

## 2017-06-24 ENCOUNTER — Encounter (HOSPITAL_COMMUNITY)
Admission: RE | Admit: 2017-06-24 | Discharge: 2017-06-24 | Disposition: A | Discharge status: Home / Self Care | Payer: Self-pay | Source: Ambulatory Visit | Attending: Cardiology | Admitting: Cardiology

## 2017-06-26 ENCOUNTER — Encounter (HOSPITAL_COMMUNITY)
Admission: RE | Admit: 2017-06-26 | Discharge: 2017-06-26 | Disposition: A | Discharge status: Home / Self Care | Payer: Self-pay | Source: Ambulatory Visit | Attending: Cardiology | Admitting: Cardiology

## 2017-07-03 ENCOUNTER — Encounter (HOSPITAL_COMMUNITY)
Admission: RE | Admit: 2017-07-03 | Discharge: 2017-07-03 | Disposition: A | Discharge status: Home / Self Care | Payer: Self-pay | Source: Ambulatory Visit | Attending: Cardiology | Admitting: Cardiology

## 2017-07-08 ENCOUNTER — Encounter (HOSPITAL_COMMUNITY)
Admission: RE | Admit: 2017-07-08 | Discharge: 2017-07-08 | Disposition: A | Discharge status: Home / Self Care | Payer: Self-pay | Source: Ambulatory Visit | Attending: Cardiology | Admitting: Cardiology

## 2017-07-09 NOTE — Progress Notes (Signed)
Pre visit review using our clinic review tool, if applicable. No additional management support is needed unless otherwise documented below in the visit note. 

## 2017-07-09 NOTE — Progress Notes (Addendum)
Subjective:   Allen Myers is a 71 y.o. male who presents for Medicare Annual/Subsequent preventive examination.  Review of Systems:  No ROS.  Medicare Wellness Visit. Additional risk factors are reflected in the social history.  Cardiac Risk Factors include: advanced age (>39men, >67 women);diabetes mellitus;dyslipidemia;male gender Sleep patterns: feels rested on waking, gets up 2-3 times nightly to void and sleeps 6-7 hours nightly.    Home Safety/Smoke Alarms: Feels safe in home. Smoke alarms in place.  Living environment; residence and Firearm Safety: 2-story house, no firearms.Lives with wife, no needs for DME, good support system Seat Belt Safety/Bike Helmet: Wears seat belt.      Objective:    Vitals: BP 122/62   Pulse (!) 59   Resp 20   Ht 6\' 1"  (1.854 m)   Wt 263 lb (119.3 kg)   SpO2 96%   BMI 34.70 kg/m   Body mass index is 34.7 kg/m.  Tobacco History  Smoking Status  . Former Smoker  . Packs/day: 1.00  . Years: 30.00  . Types: Cigarettes  . Quit date: 10/15/1983  Smokeless Tobacco  . Never Used     Counseling given: Not Answered   Past Medical History:  Diagnosis Date  . Depression   . Erectile dysfunction   . History of colon polyps   . History of diverticulitis   . Hyperlipidemia   . HYPERTENSION   . Hypothyroidism   . Kidney stones    "passed it"  . SAH (subarachnoid hemorrhage) ([place])    Following syncopal episode  . SDH (subdural hematoma) ([facility])    Following syncopal episode  . Seizures ([place])    08/2014 - controlled  . Sleep apnea    "mild"  does not use CPAP  . Sleep apnea in adult   . Type II diabetes mellitus ([place])    type 2   Past Surgical History:  Procedure Laterality Date  . COLONOSCOPY  2014   polyp  . CORONARY ANGIOPLASTY WITH STENT PLACEMENT  05/11/2014   "1"  . CRANIOTOMY Left 09/12/2014   Procedure: CRANIOTOMY HEMATOMA EVACUATION SUBDURAL;  Surgeon: Erline Levine, MD;  Location: [place] NEURO ORS;  Service: Neurosurgery;   Laterality: Left;  . FRACTIONAL FLOW RESERVE WIRE  05/11/2014   Procedure: FRACTIONAL FLOW RESERVE WIRE;  Surgeon: [name], MD;  Location: [facility] CATH LAB;  Service: Cardiovascular;;  . LEFT HEART CATHETERIZATION WITH CORONARY ANGIOGRAM N/A 05/11/2014   Procedure: LEFT HEART CATHETERIZATION WITH CORONARY ANGIOGRAM;  Surgeon: [name], MD;  Location: [facility] CATH LAB;  Service: Cardiovascular;  Laterality: N/A;  . POLYPECTOMY     Family History  Problem Relation Age of Onset  . Lung cancer Father        dad was a smoker  . Heart disease Father   . Heart disease Mother   . Ataxia Maternal Grandmother   . Dementia Maternal Grandmother   . Cancer Brother        prostate cancer  . Cancer Cousin        lung cancer  . Colon cancer Neg Hx   . Rectal cancer Neg Hx   . Stomach cancer Neg Hx   . Colon polyps Neg Hx   . Esophageal cancer Neg Hx    History  Sexual Activity  . Sexual activity: Yes    Outpatient Encounter Prescriptions as of 07/10/2017  Medication Sig  . Ascorbic Acid (VITAMIN C PO) Take 1 tablet by mouth.  Marland Kitchen aspirin  EC 81 MG tablet Take 81 mg by mouth daily.  Marland Kitchen atorvastatin (LIPITOR) 80 MG tablet Take 1 tablet (80 mg total) by mouth daily.  . carvedilol (COREG) 3.125 MG tablet TAKE 1 TABLET BY MOUTH TWICE A DAY WITH A MEAL  . levETIRAcetam (KEPPRA) 1000 MG tablet Take 1 tablet (1,000 mg total) by mouth 2 (two) times daily.  Marland Kitchen levothyroxine (SYNTHROID, LEVOTHROID) 150 MCG tablet Take 1 tablet (150 mcg total) by mouth daily.  Marland Kitchen linagliptin (TRADJENTA) 5 MG TABS tablet Take 1 tablet (5 mg total) by mouth daily.  Marland Kitchen lisinopril (PRINIVIL,ZESTRIL) 2.5 MG tablet Take 1 tablet (2.5 mg total) by mouth daily.  . metFORMIN (GLUCOPHAGE) 1000 MG tablet TAKE 1 TABLET BY MOUTH TWICE A DAY WITH A MEAL  . nitroGLYCERIN (NITROSTAT) 0.4 MG SL tablet Place 1 tablet (0.4 mg total) under the tongue every 5 (five) minutes as needed for chest pain.  Marland Kitchen omeprazole (PRILOSEC) 40 MG capsule  Take 1 capsule (40 mg total) by mouth daily.  . ONE TOUCH ULTRA TEST test strip USE TO CHECK BLOOD SUGAR TWICE A DAY AS DIRECTED  . pioglitazone (ACTOS) 30 MG tablet Take 1 tablet (30 mg total) by mouth daily.   No facility-administered encounter medications on file as of 07/10/2017.     Activities of Daily Living In your present state of health, do you have any difficulty performing the following activities: 07/10/2017  Hearing? N  Vision? N  Difficulty concentrating or making decisions? N  Walking or climbing stairs? N  Dressing or bathing? N  Doing errands, shopping? N  Preparing Food and eating ? N  Using the Toilet? N  In the past six months, have you accidently leaked urine? N  Do you have problems with loss of bowel control? N  Managing your Medications? N  Managing your Finances? N  Housekeeping or managing your Housekeeping? N  Some recent data might be hidden    Patient Care Team: Biagio Borg, MD as PCP - General   Assessment:    Physical assessment deferred to PCP.  Exercise Activities and Dietary recommendations Current Exercise Habits: Structured exercise class, Type of exercise: calisthenics;treadmill;walking, Time (Minutes): 45, Frequency (Times/Week): 5, Weekly Exercise (Minutes/Week): 225, Intensity: Moderate, Exercise limited by: cardiac condition(s)  Diet (meal preparation, eat out, water intake, caffeinated beverages, dairy products, fruits and vegetables): in general, a "healthy" diet  , well balanced   Reviewed heart healthy and diabetic diet, encouraged patient to increase daily water intake.   Goals    . lose weight          Increase the amount water I drink,  watch the amount of carbohydrates, and do portion control      Fall Risk Fall Risk  07/10/2017 02/04/2017 02/03/2017 10/24/2015 03/29/2015  Falls in the past year? Yes No No Yes Yes  Number falls in past yr: 1 - - 2 or more 1  Comment - - - - -  Injury with Fall? - - - Yes Yes  Comment - -  - left arm sprain fall with SAH  Follow up - - - Falls evaluation completed -   Depression Screen PHQ 2/9 Scores 07/10/2017 02/04/2017 03/29/2015 10/04/2014  PHQ - 2 Score 0 0 0 0    Cognitive Function       Ad8 score reviewed for issues:  Issues making decisions: no  Less interest in hobbies / activities: no  Repeats questions, stories (family complaining): no  Trouble using ordinary gadgets (  microwave, computer, phone):no  Forgets the month or year: no  Mismanaging finances: no  Remembering appts: no  Daily problems with thinking and/or memory: no Ad8 score is= 0  Immunization History  Administered Date(s) Administered  . Influenza Whole 12/11/2009, 06/19/2011  . Influenza, High Dose Seasonal PF 08/07/2015, 07/24/2016  . Influenza,inj,Quad PF,6+ Mos 06/15/2013, 07/26/2014  . Pneumococcal Conjugate-13 08/10/2014  . Pneumococcal Polysaccharide-23 05/16/2008, 08/23/2015  . Td 02/20/1996, 11/21/2008, 10/10/2010  . Zoster 05/16/2008   Screening Tests Health Maintenance  Topic Date Due  . INFLUENZA VACCINE  05/14/2017  . COLONOSCOPY  11/12/2017  . HEMOGLOBIN A1C  11/16/2017  . OPHTHALMOLOGY EXAM  11/26/2017  . FOOT EXAM  02/04/2018  . TETANUS/TDAP  10/10/2020  . Hepatitis C Screening  Completed  . PNA vac Low Risk Adult  Completed      Plan:  Continue doing brain stimulating activities (puzzles, reading, adult coloring books, staying active) to keep memory sharp.   Continue to eat heart healthy diet (full of fruits, vegetables, whole grains, lean protein, water--limit salt, fat, and sugar intake) and increase physical activity as tolerated.  I have personally reviewed and noted the following in the patient's chart:   . Medical and social history . Use of alcohol, tobacco or illicit drugs  . Current medications and supplements . Functional ability and status . Nutritional status . Physical activity . Advanced directives . List of other  physicians . Vitals . Screenings to include cognitive, depression, and falls . Referrals and appointments  In addition, I have reviewed and discussed with patient certain preventive protocols, quality metrics, and best practice recommendations. A written personalized care plan for preventive services as well as general preventive health recommendations were provided to patient.     Michiel Cowboy, RN  07/10/2017  Medical screening examination/treatment/procedure(s) were performed by non-physician practitioner and as supervising physician I was immediately available for consultation/collaboration. I agree with above. Cathlean Cower, MD

## 2017-07-10 ENCOUNTER — Ambulatory Visit (INDEPENDENT_AMBULATORY_CARE_PROVIDER_SITE_OTHER): Payer: Medicare Other | Admitting: *Deleted

## 2017-07-10 VITALS — BP 122/62 | HR 59 | Resp 20 | Ht 73.0 in | Wt 263.0 lb

## 2017-07-10 DIAGNOSIS — Z0000 Encounter for general adult medical examination without abnormal findings: Secondary | ICD-10-CM

## 2017-07-10 DIAGNOSIS — Z23 Encounter for immunization: Secondary | ICD-10-CM

## 2017-07-10 DIAGNOSIS — L570 Actinic keratosis: Secondary | ICD-10-CM | POA: Diagnosis not present

## 2017-07-10 NOTE — Patient Instructions (Signed)
Continue doing brain stimulating activities (puzzles, reading, adult coloring books, staying active) to keep memory sharp.   Continue to eat heart healthy diet (full of fruits, vegetables, whole grains, lean protein, water--limit salt, fat, and sugar intake) and increase physical activity as tolerated.   Allen Myers , Thank you for taking time to come for your Medicare Wellness Visit. I appreciate your ongoing commitment to your health goals. Please review the following plan we discussed and let me know if I can assist you in the future.   These are the goals we discussed: Goals    . lose weight          Increase the amount water I drink,  watch the amount of carbohydrates, and do portion control       This is a list of the screening recommended for you and due dates:  Health Maintenance  Topic Date Due  . Flu Shot  05/14/2017  . Colon Cancer Screening  11/12/2017  . Hemoglobin A1C  11/16/2017  . Eye exam for diabetics  11/26/2017  . Complete foot exam   02/04/2018  . Tetanus Vaccine  10/10/2020  .  Hepatitis C: One time screening is recommended by Center for Disease Control  (CDC) for  adults born from [birth year] through 1965.   Completed  . Pneumonia vaccines  Completed   Influenza Virus Vaccine injection What is this medicine? INFLUENZA VIRUS VACCINE (in floo EN zuh VAHY ruhs vak SEEN) helps to reduce the risk of getting influenza also known as the flu. The vaccine only helps protect you against some strains of the flu. This medicine may be used for other purposes; ask your health care provider or pharmacist if you have questions. COMMON BRAND NAME(S): Afluria, Agriflu, Alfuria, FLUAD, Fluarix, Fluarix Quadrivalent, Flublok, Flublok Quadrivalent, FLUCELVAX, Flulaval, Fluvirin, Fluzone, Fluzone High-Dose, Fluzone Intradermal What should I tell my health care provider before I take this medicine? They need to know if you have any of these conditions: -bleeding disorder like  hemophilia -fever or infection -Guillain-Barre syndrome or other neurological problems -immune system problems -infection with the human immunodeficiency virus (HIV) or AIDS -low blood platelet counts -multiple sclerosis -an unusual or allergic reaction to influenza virus vaccine, latex, other medicines, foods, dyes, or preservatives. Different brands of vaccines contain different allergens. Some may contain latex or eggs. Talk to your doctor about your allergies to make sure that you get the right vaccine. -pregnant or trying to get pregnant -breast-feeding How should I use this medicine? This vaccine is for injection into a muscle or under the skin. It is given by a health care professional. A copy of Vaccine Information Statements will be given before each vaccination. Read this sheet carefully each time. The sheet may change frequently. Talk to your healthcare provider to see which vaccines are right for you. Some vaccines should not be used in all age groups. Overdosage: If you think you have taken too much of this medicine contact a poison control center or emergency room at once. NOTE: This medicine is only for you. Do not share this medicine with others. What if I miss a dose? This does not apply. What may interact with this medicine? -chemotherapy or radiation therapy -medicines that lower your immune system like etanercept, anakinra, infliximab, and adalimumab -medicines that treat or prevent blood clots like warfarin -phenytoin -steroid medicines like prednisone or cortisone -theophylline -vaccines This list may not describe all possible interactions. Give your health care provider a list of all the  medicines, herbs, non-prescription drugs, or dietary supplements you use. Also tell them if you smoke, drink alcohol, or use illegal drugs. Some items may interact with your medicine. What should I watch for while using this medicine? Report any side effects that do not go away  within 3 days to your doctor or health care professional. Call your health care provider if any unusual symptoms occur within 6 weeks of receiving this vaccine. You may still catch the flu, but the illness is not usually as bad. You cannot get the flu from the vaccine. The vaccine will not protect against colds or other illnesses that may cause fever. The vaccine is needed every year. What side effects may I notice from receiving this medicine? Side effects that you should report to your doctor or health care professional as soon as possible: -allergic reactions like skin rash, itching or hives, swelling of the face, lips, or tongue Side effects that usually do not require medical attention (report to your doctor or health care professional if they continue or are bothersome): -fever -headache -muscle aches and pains -pain, tenderness, redness, or swelling at the injection site -tiredness This list may not describe all possible side effects. Call your doctor for medical advice about side effects. You may report side effects to FDA at 1-800-FDA-1088. Where should I keep my medicine? The vaccine will be given by a health care professional in a clinic, pharmacy, doctor's office, or other health care setting. You will not be given vaccine doses to store at home. NOTE: This sheet is a summary. It may not cover all possible information. If you have questions about this medicine, talk to your doctor, pharmacist, or health care provider.  2018 Elsevier/Gold Standard (2015-04-21 10:07:28)

## 2017-07-15 ENCOUNTER — Encounter (HOSPITAL_COMMUNITY)
Admission: RE | Admit: 2017-07-15 | Discharge: 2017-07-15 | Disposition: A | Discharge status: Home / Self Care | Payer: Self-pay | Source: Ambulatory Visit | Attending: Cardiology | Admitting: Cardiology

## 2017-07-15 DIAGNOSIS — Z9889 Other specified postprocedural states: Secondary | ICD-10-CM | POA: Insufficient documentation

## 2017-07-15 DIAGNOSIS — Z955 Presence of coronary angioplasty implant and graft: Secondary | ICD-10-CM | POA: Insufficient documentation

## 2017-07-15 DIAGNOSIS — I2510 Atherosclerotic heart disease of native coronary artery without angina pectoris: Secondary | ICD-10-CM | POA: Insufficient documentation

## 2017-07-17 ENCOUNTER — Encounter (HOSPITAL_COMMUNITY)
Admission: RE | Admit: 2017-07-17 | Discharge: 2017-07-17 | Disposition: A | Discharge status: Home / Self Care | Payer: Self-pay | Source: Ambulatory Visit | Attending: Cardiology | Admitting: Cardiology

## 2017-07-24 ENCOUNTER — Encounter (HOSPITAL_COMMUNITY)
Admission: RE | Admit: 2017-07-24 | Discharge: 2017-07-24 | Disposition: A | Discharge status: Home / Self Care | Payer: Self-pay | Source: Ambulatory Visit | Attending: Cardiology | Admitting: Cardiology

## 2017-07-25 ENCOUNTER — Encounter (HOSPITAL_COMMUNITY)
Admission: RE | Admit: 2017-07-25 | Discharge: 2017-07-25 | Disposition: A | Discharge status: Home / Self Care | Payer: Self-pay | Source: Ambulatory Visit | Attending: Cardiology | Admitting: Cardiology

## 2017-07-28 ENCOUNTER — Encounter (HOSPITAL_COMMUNITY)
Admission: RE | Admit: 2017-07-28 | Discharge: 2017-07-28 | Disposition: A | Discharge status: Home / Self Care | Payer: Self-pay | Source: Ambulatory Visit | Attending: Cardiology | Admitting: Cardiology

## 2017-07-29 ENCOUNTER — Encounter (HOSPITAL_COMMUNITY)
Admission: RE | Admit: 2017-07-29 | Discharge: 2017-07-29 | Disposition: A | Discharge status: Home / Self Care | Payer: Self-pay | Source: Ambulatory Visit | Attending: Cardiology | Admitting: Cardiology

## 2017-07-31 ENCOUNTER — Encounter (HOSPITAL_COMMUNITY)
Admission: RE | Admit: 2017-07-31 | Discharge: 2017-07-31 | Disposition: A | Discharge status: Home / Self Care | Payer: Self-pay | Source: Ambulatory Visit | Attending: Cardiology | Admitting: Cardiology

## 2017-08-04 ENCOUNTER — Encounter (HOSPITAL_COMMUNITY): Payer: Self-pay

## 2017-08-05 ENCOUNTER — Encounter (HOSPITAL_COMMUNITY)
Admission: RE | Admit: 2017-08-05 | Discharge: 2017-08-05 | Disposition: A | Discharge status: Home / Self Care | Payer: Self-pay | Source: Ambulatory Visit | Attending: Cardiology | Admitting: Cardiology

## 2017-08-05 ENCOUNTER — Other Ambulatory Visit: Payer: Self-pay | Admitting: *Deleted

## 2017-08-05 DIAGNOSIS — I714 Abdominal aortic aneurysm, without rupture, unspecified: Secondary | ICD-10-CM

## 2017-08-07 ENCOUNTER — Encounter (HOSPITAL_COMMUNITY)
Admission: RE | Admit: 2017-08-07 | Discharge: 2017-08-07 | Disposition: A | Discharge status: Home / Self Care | Payer: Self-pay | Source: Ambulatory Visit | Attending: Cardiology | Admitting: Cardiology

## 2017-08-11 ENCOUNTER — Encounter (HOSPITAL_COMMUNITY)
Admission: RE | Admit: 2017-08-11 | Discharge: 2017-08-11 | Disposition: A | Discharge status: Home / Self Care | Payer: Self-pay | Source: Ambulatory Visit | Attending: Cardiology | Admitting: Cardiology

## 2017-08-12 ENCOUNTER — Encounter (HOSPITAL_COMMUNITY)
Admission: RE | Admit: 2017-08-12 | Discharge: 2017-08-12 | Disposition: A | Discharge status: Home / Self Care | Payer: Self-pay | Source: Ambulatory Visit | Attending: Cardiology | Admitting: Cardiology

## 2017-08-14 ENCOUNTER — Encounter (HOSPITAL_COMMUNITY)
Admission: RE | Admit: 2017-08-14 | Discharge: 2017-08-14 | Disposition: A | Discharge status: Home / Self Care | Payer: Self-pay | Source: Ambulatory Visit | Attending: Cardiology | Admitting: Cardiology

## 2017-08-14 DIAGNOSIS — I2510 Atherosclerotic heart disease of native coronary artery without angina pectoris: Secondary | ICD-10-CM | POA: Insufficient documentation

## 2017-08-14 DIAGNOSIS — Z9889 Other specified postprocedural states: Secondary | ICD-10-CM | POA: Insufficient documentation

## 2017-08-14 DIAGNOSIS — Z955 Presence of coronary angioplasty implant and graft: Secondary | ICD-10-CM | POA: Insufficient documentation

## 2017-08-18 ENCOUNTER — Encounter (HOSPITAL_COMMUNITY): Payer: Self-pay

## 2017-08-19 ENCOUNTER — Encounter (HOSPITAL_COMMUNITY)
Admission: RE | Admit: 2017-08-19 | Discharge: 2017-08-19 | Disposition: A | Discharge status: Home / Self Care | Payer: Self-pay | Source: Ambulatory Visit | Attending: Cardiology | Admitting: Cardiology

## 2017-08-21 ENCOUNTER — Encounter (HOSPITAL_COMMUNITY)
Admission: RE | Admit: 2017-08-21 | Discharge: 2017-08-21 | Disposition: A | Discharge status: Home / Self Care | Payer: Self-pay | Source: Ambulatory Visit | Attending: Cardiology | Admitting: Cardiology

## 2017-08-22 DIAGNOSIS — L570 Actinic keratosis: Secondary | ICD-10-CM | POA: Diagnosis not present

## 2017-08-25 ENCOUNTER — Encounter (HOSPITAL_COMMUNITY): Admission: RE | Admit: 2017-08-25 | Payer: Self-pay | Source: Ambulatory Visit

## 2017-08-26 ENCOUNTER — Encounter (HOSPITAL_COMMUNITY)
Admission: RE | Admit: 2017-08-26 | Discharge: 2017-08-26 | Disposition: A | Discharge status: Home / Self Care | Payer: Self-pay | Source: Ambulatory Visit | Attending: Cardiology | Admitting: Cardiology

## 2017-08-28 ENCOUNTER — Encounter (HOSPITAL_COMMUNITY)
Admission: RE | Admit: 2017-08-28 | Discharge: 2017-08-28 | Disposition: A | Discharge status: Home / Self Care | Payer: Self-pay | Source: Ambulatory Visit | Attending: Cardiology | Admitting: Cardiology

## 2017-08-29 ENCOUNTER — Encounter (HOSPITAL_COMMUNITY)
Admission: RE | Admit: 2017-08-29 | Discharge: 2017-08-29 | Disposition: A | Discharge status: Home / Self Care | Payer: Self-pay | Source: Ambulatory Visit | Attending: Cardiology | Admitting: Cardiology

## 2017-09-01 ENCOUNTER — Encounter (HOSPITAL_COMMUNITY)
Admission: RE | Admit: 2017-09-01 | Discharge: 2017-09-01 | Disposition: A | Discharge status: Home / Self Care | Payer: Self-pay | Source: Ambulatory Visit | Attending: Cardiology | Admitting: Cardiology

## 2017-09-02 ENCOUNTER — Encounter (HOSPITAL_COMMUNITY)
Admission: RE | Admit: 2017-09-02 | Discharge: 2017-09-02 | Disposition: A | Discharge status: Home / Self Care | Payer: Self-pay | Source: Ambulatory Visit | Attending: Cardiology | Admitting: Cardiology

## 2017-09-03 ENCOUNTER — Encounter (HOSPITAL_COMMUNITY)
Admission: RE | Admit: 2017-09-03 | Discharge: 2017-09-03 | Disposition: A | Discharge status: Home / Self Care | Payer: Self-pay | Source: Ambulatory Visit | Attending: Cardiology | Admitting: Cardiology

## 2017-09-08 ENCOUNTER — Encounter (HOSPITAL_COMMUNITY)
Admission: RE | Admit: 2017-09-08 | Discharge: 2017-09-08 | Disposition: A | Discharge status: Home / Self Care | Payer: Self-pay | Source: Ambulatory Visit | Attending: Cardiology | Admitting: Cardiology

## 2017-09-09 ENCOUNTER — Encounter (HOSPITAL_COMMUNITY)
Admission: RE | Admit: 2017-09-09 | Discharge: 2017-09-09 | Disposition: A | Discharge status: Home / Self Care | Payer: Self-pay | Source: Ambulatory Visit | Attending: Cardiology | Admitting: Cardiology

## 2017-09-11 ENCOUNTER — Encounter (HOSPITAL_COMMUNITY): Payer: Self-pay

## 2017-09-15 ENCOUNTER — Encounter (HOSPITAL_COMMUNITY): Payer: Self-pay

## 2017-09-15 DIAGNOSIS — I2510 Atherosclerotic heart disease of native coronary artery without angina pectoris: Secondary | ICD-10-CM | POA: Insufficient documentation

## 2017-09-15 DIAGNOSIS — Z955 Presence of coronary angioplasty implant and graft: Secondary | ICD-10-CM | POA: Insufficient documentation

## 2017-09-15 DIAGNOSIS — Z9889 Other specified postprocedural states: Secondary | ICD-10-CM | POA: Insufficient documentation

## 2017-09-16 ENCOUNTER — Encounter (HOSPITAL_COMMUNITY): Payer: Self-pay

## 2017-09-18 ENCOUNTER — Encounter (HOSPITAL_COMMUNITY): Payer: Self-pay

## 2017-09-19 ENCOUNTER — Encounter (HOSPITAL_COMMUNITY)
Admission: RE | Admit: 2017-09-19 | Discharge: 2017-09-19 | Disposition: A | Discharge status: Home / Self Care | Payer: Medicare Other | Source: Ambulatory Visit | Attending: Cardiology | Admitting: Cardiology

## 2017-09-22 ENCOUNTER — Encounter (HOSPITAL_COMMUNITY): Payer: Self-pay

## 2017-09-23 ENCOUNTER — Encounter (HOSPITAL_COMMUNITY): Payer: Self-pay

## 2017-09-25 ENCOUNTER — Encounter (HOSPITAL_COMMUNITY): Payer: Self-pay

## 2017-09-29 ENCOUNTER — Encounter (HOSPITAL_COMMUNITY): Payer: Self-pay

## 2017-09-30 ENCOUNTER — Encounter (HOSPITAL_COMMUNITY): Payer: Self-pay

## 2017-10-02 ENCOUNTER — Encounter (HOSPITAL_COMMUNITY): Payer: Self-pay

## 2017-10-09 ENCOUNTER — Encounter (HOSPITAL_COMMUNITY): Payer: Self-pay

## 2017-10-13 ENCOUNTER — Encounter (HOSPITAL_COMMUNITY): Payer: Self-pay

## 2017-10-16 ENCOUNTER — Encounter (HOSPITAL_COMMUNITY): Payer: Self-pay

## 2017-10-16 DIAGNOSIS — Z955 Presence of coronary angioplasty implant and graft: Secondary | ICD-10-CM | POA: Insufficient documentation

## 2017-10-16 DIAGNOSIS — Z9889 Other specified postprocedural states: Secondary | ICD-10-CM | POA: Insufficient documentation

## 2017-10-16 DIAGNOSIS — I2510 Atherosclerotic heart disease of native coronary artery without angina pectoris: Secondary | ICD-10-CM | POA: Insufficient documentation

## 2017-10-17 ENCOUNTER — Encounter (HOSPITAL_COMMUNITY)
Admission: RE | Admit: 2017-10-17 | Discharge: 2017-10-17 | Disposition: A | Discharge status: Home / Self Care | Payer: Self-pay | Source: Ambulatory Visit | Attending: Cardiology | Admitting: Cardiology

## 2017-10-20 ENCOUNTER — Encounter (HOSPITAL_COMMUNITY)
Admission: RE | Admit: 2017-10-20 | Discharge: 2017-10-20 | Disposition: A | Discharge status: Home / Self Care | Payer: Self-pay | Source: Ambulatory Visit | Attending: Cardiology | Admitting: Cardiology

## 2017-10-21 ENCOUNTER — Encounter (HOSPITAL_COMMUNITY)
Admission: RE | Admit: 2017-10-21 | Discharge: 2017-10-21 | Disposition: A | Discharge status: Home / Self Care | Payer: Self-pay | Source: Ambulatory Visit | Attending: Cardiology | Admitting: Cardiology

## 2017-10-23 ENCOUNTER — Encounter (HOSPITAL_COMMUNITY)
Admission: RE | Admit: 2017-10-23 | Discharge: 2017-10-23 | Disposition: A | Discharge status: Home / Self Care | Payer: Self-pay | Source: Ambulatory Visit | Attending: Cardiology | Admitting: Cardiology

## 2017-10-24 DIAGNOSIS — L570 Actinic keratosis: Secondary | ICD-10-CM | POA: Diagnosis not present

## 2017-10-27 ENCOUNTER — Encounter (HOSPITAL_COMMUNITY): Payer: Self-pay

## 2017-10-28 ENCOUNTER — Encounter (HOSPITAL_COMMUNITY)
Admission: RE | Admit: 2017-10-28 | Discharge: 2017-10-28 | Disposition: A | Discharge status: Home / Self Care | Payer: Self-pay | Source: Ambulatory Visit | Attending: Cardiology | Admitting: Cardiology

## 2017-10-30 ENCOUNTER — Encounter (HOSPITAL_COMMUNITY): Payer: Self-pay

## 2017-10-31 ENCOUNTER — Encounter (HOSPITAL_COMMUNITY)
Admission: RE | Admit: 2017-10-31 | Discharge: 2017-10-31 | Disposition: A | Discharge status: Home / Self Care | Payer: Self-pay | Source: Ambulatory Visit | Attending: Cardiology | Admitting: Cardiology

## 2017-11-03 ENCOUNTER — Encounter (HOSPITAL_COMMUNITY): Payer: Self-pay

## 2017-11-04 ENCOUNTER — Encounter (HOSPITAL_COMMUNITY)
Admission: RE | Admit: 2017-11-04 | Discharge: 2017-11-04 | Disposition: A | Discharge status: Home / Self Care | Payer: Self-pay | Source: Ambulatory Visit | Attending: Cardiology | Admitting: Cardiology

## 2017-11-06 ENCOUNTER — Encounter (HOSPITAL_COMMUNITY)
Admission: RE | Admit: 2017-11-06 | Discharge: 2017-11-06 | Disposition: A | Discharge status: Home / Self Care | Payer: Self-pay | Source: Ambulatory Visit | Attending: Cardiology | Admitting: Cardiology

## 2017-11-10 ENCOUNTER — Encounter (HOSPITAL_COMMUNITY): Payer: Self-pay

## 2017-11-11 ENCOUNTER — Encounter (HOSPITAL_COMMUNITY)
Admission: RE | Admit: 2017-11-11 | Discharge: 2017-11-11 | Disposition: A | Discharge status: Home / Self Care | Payer: Self-pay | Source: Ambulatory Visit | Attending: Cardiology | Admitting: Cardiology
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
## 2017-11-12 ENCOUNTER — Encounter: Payer: Self-pay | Admitting: *Deleted

## 2017-11-13 ENCOUNTER — Encounter (HOSPITAL_COMMUNITY): Payer: Self-pay

## 2017-11-14 ENCOUNTER — Encounter (HOSPITAL_COMMUNITY)
Admission: RE | Admit: 2017-11-14 | Discharge: 2017-11-14 | Disposition: A | Discharge status: Home / Self Care | Payer: Self-pay | Source: Ambulatory Visit | Attending: Cardiology | Admitting: Cardiology

## 2017-11-14 DIAGNOSIS — I2510 Atherosclerotic heart disease of native coronary artery without angina pectoris: Secondary | ICD-10-CM | POA: Insufficient documentation

## 2017-11-14 DIAGNOSIS — Z9889 Other specified postprocedural states: Secondary | ICD-10-CM | POA: Insufficient documentation

## 2017-11-14 DIAGNOSIS — Z955 Presence of coronary angioplasty implant and graft: Secondary | ICD-10-CM | POA: Insufficient documentation

## 2017-11-17 ENCOUNTER — Encounter (HOSPITAL_COMMUNITY)
Admission: RE | Admit: 2017-11-17 | Discharge: 2017-11-17 | Disposition: A | Discharge status: Home / Self Care | Payer: Self-pay | Source: Ambulatory Visit | Attending: Cardiology | Admitting: Cardiology

## 2017-11-18 ENCOUNTER — Telehealth: Payer: Self-pay

## 2017-11-18 ENCOUNTER — Other Ambulatory Visit (INDEPENDENT_AMBULATORY_CARE_PROVIDER_SITE_OTHER): Payer: Medicare Other

## 2017-11-18 ENCOUNTER — Encounter (HOSPITAL_COMMUNITY)
Admission: RE | Admit: 2017-11-18 | Discharge: 2017-11-18 | Disposition: A | Discharge status: Home / Self Care | Payer: Self-pay | Source: Ambulatory Visit | Attending: Cardiology | Admitting: Cardiology

## 2017-11-18 DIAGNOSIS — E119 Type 2 diabetes mellitus without complications: Secondary | ICD-10-CM

## 2017-11-18 LAB — BASIC METABOLIC PANEL
BUN: 15 mg/dL (ref 6–23)
CALCIUM: 8.8 mg/dL (ref 8.4–10.5)
CO2: 27 mEq/L (ref 19–32)
Chloride: 103 mEq/L (ref 96–112)
Creatinine, Ser: 0.89 mg/dL (ref 0.40–1.50)
GFR: 89.42 mL/min (ref >60.00)
Glucose, Bld: 118 mg/dL — ABNORMAL HIGH (ref 70–99)
POTASSIUM: 4.5 meq/L (ref 3.5–5.1)
Sodium: 138 mEq/L (ref 135–145)

## 2017-11-18 LAB — HEMOGLOBIN A1C: HEMOGLOBIN A1C: 7.5 % — AB (ref 4.6–6.5)

## 2017-11-18 NOTE — Telephone Encounter (Signed)
Lab called stating that patient has a upcoming DM follow up appt. I have entered Dm follow up labs.

## 2017-11-20 ENCOUNTER — Ambulatory Visit (INDEPENDENT_AMBULATORY_CARE_PROVIDER_SITE_OTHER): Payer: Medicare Other | Admitting: Cardiology

## 2017-11-20 ENCOUNTER — Ambulatory Visit (INDEPENDENT_AMBULATORY_CARE_PROVIDER_SITE_OTHER): Payer: Medicare Other | Admitting: Internal Medicine

## 2017-11-20 ENCOUNTER — Encounter: Payer: Self-pay | Admitting: Internal Medicine

## 2017-11-20 ENCOUNTER — Encounter (HOSPITAL_COMMUNITY): Payer: Self-pay

## 2017-11-20 ENCOUNTER — Encounter: Payer: Self-pay | Admitting: Cardiology

## 2017-11-20 VITALS — BP 118/62 | HR 67 | Ht 73.0 in | Wt 262.0 lb

## 2017-11-20 VITALS — BP 118/76 | HR 68 | Temp 97.5°F | Ht 73.0 in | Wt 263.0 lb

## 2017-11-20 DIAGNOSIS — E7800 Pure hypercholesterolemia, unspecified: Secondary | ICD-10-CM | POA: Diagnosis not present

## 2017-11-20 DIAGNOSIS — E119 Type 2 diabetes mellitus without complications: Secondary | ICD-10-CM | POA: Diagnosis not present

## 2017-11-20 DIAGNOSIS — I2510 Atherosclerotic heart disease of native coronary artery without angina pectoris: Secondary | ICD-10-CM

## 2017-11-20 DIAGNOSIS — I10 Essential (primary) hypertension: Secondary | ICD-10-CM

## 2017-11-20 DIAGNOSIS — Z86010 Personal history of colonic polyps: Secondary | ICD-10-CM

## 2017-11-20 DIAGNOSIS — I714 Abdominal aortic aneurysm, without rupture, unspecified: Secondary | ICD-10-CM

## 2017-11-20 NOTE — Patient Instructions (Addendum)
Medication Instructions:   NO CHANGE  Testing/Procedures:  Your physician has requested that you have an abdominal aorta duplex. During this test, an ultrasound is used to evaluate the aorta. Allow 30 minutes for this exam. Do not eat after midnight the day before and avoid carbonated beverages   Follow-Up:  Your physician wants you to follow-up in: ONE YEAR WITH DR CRENSHAW You will receive a reminder letter in the mail two months in advance. If you don't receive a letter, please call our office to schedule the follow-up appointment.   If you need a refill on your cardiac medications before your next appointment, please call your pharmacy.    

## 2017-11-20 NOTE — Patient Instructions (Addendum)
You will be contacted regarding the referral for: colonoscopy  Please call or let us know if you would want the change of generic actos to Invokana or similar medication in the SGLT2 class   Please continue all other medications as before, and refills have been done if requested.  Please have the pharmacy call with any other refills you may need.  Please keep your appointments with your specialists as you may have planned  Please return in 6 months, or sooner if needed (at which time the blood work can be done)

## 2017-11-20 NOTE — Progress Notes (Signed)
Subjective:    Patient ID: Allen Myers, male    DOB: August 18, 1946, 72 y.o.   MRN: [id number]  HPI  Here to f/u; overall doing ok,  Pt denies chest pain, increasing sob or doe, wheezing, orthopnea, PND, increased LE swelling, palpitations, dizziness or syncope.  Pt denies new neurological symptoms such as new headache, or facial or extremity weakness or numbness.  Pt denies polydipsia, polyuria, or low sugar episode.  Pt states overall good compliance with meds, mostly trying to follow appropriate diet, with wt overall stable,  but little exercise however.  Wt Readings from Last 3 Encounters:  11/20/17 263 lb (119.3 kg)  11/20/17 262 lb (118.8 kg)  07/10/17 263 lb (119.3 kg)  No other interval hx or complaints  Due for colonoscopy Past Medical History:  Diagnosis Date  . Depression   . Erectile dysfunction   . History of colon polyps   . History of diverticulitis   . Hyperlipidemia   . HYPERTENSION   . Hypothyroidism   . Kidney stones    "passed it"  . SAH (subarachnoid hemorrhage) ([place])    Following syncopal episode  . SDH (subdural hematoma) ([facility])    Following syncopal episode  . Seizures ([place])    08/2014 - controlled  . Sleep apnea    "mild"  does not use CPAP  . Sleep apnea in adult   . Type II diabetes mellitus ([place])    type 2   Past Surgical History:  Procedure Laterality Date  . COLONOSCOPY  2014   polyp  . CORONARY ANGIOPLASTY WITH STENT PLACEMENT  05/11/2014   "1"  . CRANIOTOMY Left 09/12/2014   Procedure: CRANIOTOMY HEMATOMA EVACUATION SUBDURAL;  Surgeon: Erline Levine, MD;  Location: [facility];  Service: Neurosurgery;  Laterality: Left;  . FRACTIONAL FLOW RESERVE WIRE  05/11/2014   Procedure: FRACTIONAL FLOW RESERVE WIRE;  Surgeon: [name], MD;  Location: [facility] CATH LAB;  Service: Cardiovascular;;  . LEFT HEART CATHETERIZATION WITH CORONARY ANGIOGRAM N/A 05/11/2014   Procedure: LEFT HEART CATHETERIZATION WITH CORONARY ANGIOGRAM;  Surgeon: [name], MD;  Location: [facility] CATH LAB;  Service: Cardiovascular;  Laterality: N/A;  . POLYPECTOMY      reports that he quit smoking about 34 years ago. His smoking use included cigarettes. He has a 30.00 pack-year smoking history. he has never used smokeless tobacco. He reports that he does not drink alcohol or use drugs. family history includes Aplastic anemia in his sister; Ataxia in his maternal grandmother; Dementia in his maternal grandmother; Heart disease in his father and mother; Lung cancer in his cousin and father; Prostate cancer in his brother. No Known Allergies Current Outpatient Medications on File Prior to Visit  Medication Sig Dispense Refill  . Ascorbic Acid (VITAMIN C PO) Take 1 tablet by mouth.    Marland Kitchen aspirin EC 81 MG tablet Take 81 mg by mouth daily.    Marland Kitchen atorvastatin (LIPITOR) 80 MG tablet Take 1 tablet (80 mg total) by mouth daily. 90 tablet 3  . carvedilol (COREG) 3.125 MG tablet TAKE 1 TABLET BY MOUTH TWICE A DAY WITH A MEAL 180 tablet 3  . levETIRAcetam (KEPPRA) 1000 MG tablet Take 1 tablet (1,000 mg total) by mouth 2 (two) times daily. 180 tablet 3  . levothyroxine (SYNTHROID, LEVOTHROID) 150 MCG tablet Take 1 tablet (150 mcg total) by mouth daily. 90 tablet 3  . linagliptin (TRADJENTA) 5 MG TABS tablet Take 1 tablet (5 mg total) by  mouth daily. 90 tablet 3  . lisinopril (PRINIVIL,ZESTRIL) 2.5 MG tablet Take 1 tablet (2.5 mg total) by mouth daily. 90 tablet 3  . metFORMIN (GLUCOPHAGE) 1000 MG tablet TAKE 1 TABLET BY MOUTH TWICE A DAY WITH A MEAL 180 tablet 3  . nitroGLYCERIN (NITROSTAT) 0.4 MG SL tablet Place 1 tablet (0.4 mg total) under the tongue every 5 (five) minutes as needed for chest pain. 25 tablet 12  . omeprazole (PRILOSEC) 40 MG capsule Take 1 capsule (40 mg total) by mouth daily. 90 capsule 3  . ONE TOUCH ULTRA TEST test strip USE TO CHECK BLOOD SUGAR TWICE A DAY AS DIRECTED 100 each 0  . pioglitazone (ACTOS) 30 MG tablet Take 1 tablet (30 mg total) by mouth daily.  90 tablet 3   No current facility-administered medications on file prior to visit.    Review of Systems  Constitutional: Negative for other unusual diaphoresis or sweats HENT: Negative for ear discharge or swelling Eyes: Negative for other worsening visual disturbances Respiratory: Negative for stridor or other swelling  Gastrointestinal: Negative for worsening distension or other blood Genitourinary: Negative for retention or other urinary change Musculoskeletal: Negative for other MSK pain or swelling Skin: Negative for color change or other new lesions Neurological: Negative for worsening tremors and other numbness  Psychiatric/Behavioral: Negative for worsening agitation or other fatigue All other system neg per pt    Objective:   Physical Exam BP 118/76   Pulse 68   Temp (!) 97.5 F (36.4 C) (Oral)   Ht 6\' 1"  (1.854 m)   Wt 263 lb (119.3 kg)   SpO2 98%   BMI 34.70 kg/m  VS noted,  Constitutional: Pt appears in NAD HENT: Head: NCAT.  Right Ear: External ear normal.  Left Ear: External ear normal.  Eyes: . Pupils are equal, round, and reactive to light. Conjunctivae and EOM are normal Nose: without d/c or deformity Neck: Neck supple. Gross normal ROM Cardiovascular: Normal rate and regular rhythm.   Pulmonary/Chest: Effort normal and breath sounds without rales or wheezing.  Neurological: Pt is alert. At baseline orientation, motor grossly intact Skin: Skin is warm. No rashes, other new lesions, no LE edema Psychiatric: Pt behavior is normal without agitation  No other exam findings    Assessment & Plan:

## 2017-11-21 ENCOUNTER — Encounter (HOSPITAL_COMMUNITY)
Admission: RE | Admit: 2017-11-21 | Discharge: 2017-11-21 | Disposition: A | Discharge status: Home / Self Care | Payer: Self-pay | Source: Ambulatory Visit | Attending: Cardiology | Admitting: Cardiology

## 2017-11-23 NOTE — Assessment & Plan Note (Signed)
stable overall by history and exam, recent data reviewed with pt, and pt to continue medical treatment as before,  to f/u any worsening symptoms or concerns Lab Results  Component Value Date   LDLCALC 30 05/16/2017

## 2017-11-23 NOTE — Assessment & Plan Note (Signed)
stable overall by history and exam, recent data reviewed with pt, and pt to continue medical treatment as before,  to f/u any worsening symptoms or concerns BP Readings from Last 3 Encounters:  11/20/17 118/76  11/20/17 118/62  07/10/17 122/62

## 2017-11-23 NOTE — Assessment & Plan Note (Signed)
Lab Results  Component Value Date   HGBA1C 7.5 (H) 11/18/2017  stable overall by history and exam, recent data reviewed with pt, and pt to continue medical treatment as before,  to f/u any worsening symptoms or concerns

## 2017-11-24 ENCOUNTER — Encounter (HOSPITAL_COMMUNITY)
Admission: RE | Admit: 2017-11-24 | Discharge: 2017-11-24 | Disposition: A | Discharge status: Home / Self Care | Payer: Self-pay | Source: Ambulatory Visit | Attending: Cardiology | Admitting: Cardiology

## 2017-11-25 ENCOUNTER — Encounter (HOSPITAL_COMMUNITY)
Admission: RE | Admit: 2017-11-25 | Discharge: 2017-11-25 | Disposition: A | Discharge status: Home / Self Care | Payer: Self-pay | Source: Ambulatory Visit | Attending: Cardiology | Admitting: Cardiology

## 2017-11-27 ENCOUNTER — Encounter (HOSPITAL_COMMUNITY)
Admission: RE | Admit: 2017-11-27 | Discharge: 2017-11-27 | Disposition: A | Discharge status: Home / Self Care | Payer: Self-pay | Source: Ambulatory Visit | Attending: Cardiology | Admitting: Cardiology

## 2017-12-01 ENCOUNTER — Encounter (HOSPITAL_COMMUNITY)
Admission: RE | Admit: 2017-12-01 | Discharge: 2017-12-01 | Disposition: A | Discharge status: Home / Self Care | Payer: Self-pay | Source: Ambulatory Visit | Attending: Cardiology | Admitting: Cardiology

## 2017-12-02 ENCOUNTER — Encounter (HOSPITAL_COMMUNITY): Payer: Self-pay

## 2017-12-04 ENCOUNTER — Encounter (HOSPITAL_COMMUNITY)
Admission: RE | Admit: 2017-12-04 | Discharge: 2017-12-04 | Disposition: A | Discharge status: Home / Self Care | Payer: Self-pay | Source: Ambulatory Visit | Attending: Cardiology | Admitting: Cardiology

## 2017-12-08 ENCOUNTER — Encounter (HOSPITAL_COMMUNITY): Payer: Self-pay

## 2017-12-09 ENCOUNTER — Encounter (HOSPITAL_COMMUNITY): Payer: Self-pay

## 2017-12-11 ENCOUNTER — Telehealth: Payer: Self-pay | Admitting: Internal Medicine

## 2017-12-11 ENCOUNTER — Encounter (HOSPITAL_COMMUNITY): Payer: Self-pay

## 2017-12-11 NOTE — Telephone Encounter (Signed)
Error

## 2017-12-11 NOTE — Telephone Encounter (Signed)
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 Jenny Reichmann.  Once Dr. 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

## 2017-12-12 ENCOUNTER — Encounter (HOSPITAL_COMMUNITY)
Admission: RE | Admit: 2017-12-12 | Discharge: 2017-12-12 | Disposition: A | Discharge status: Home / Self Care | Payer: Self-pay | Source: Ambulatory Visit | Attending: Cardiology | Admitting: Cardiology

## 2017-12-12 DIAGNOSIS — I714 Abdominal aortic aneurysm, without rupture: Secondary | ICD-10-CM | POA: Insufficient documentation

## 2017-12-12 DIAGNOSIS — I10 Essential (primary) hypertension: Secondary | ICD-10-CM | POA: Insufficient documentation

## 2017-12-12 DIAGNOSIS — E7800 Pure hypercholesterolemia, unspecified: Secondary | ICD-10-CM | POA: Insufficient documentation

## 2017-12-12 DIAGNOSIS — I2510 Atherosclerotic heart disease of native coronary artery without angina pectoris: Secondary | ICD-10-CM | POA: Insufficient documentation

## 2017-12-12 NOTE — Telephone Encounter (Signed)
This should be addressed with one of the questions on his form  I think he should stay on the program

## 2017-12-12 NOTE — Telephone Encounter (Signed)
Forms have been completed & placed in MD box to review and sign. Thank you.

## 2017-12-12 NOTE — Telephone Encounter (Signed)
Patient is now request a letter stating that he does not need to be on this program. Please advise if you would like for me to complete.

## 2017-12-12 NOTE — Telephone Encounter (Signed)
Copy has been sent to scan & patient informed it is ready to be picked up.

## 2017-12-12 NOTE — Telephone Encounter (Signed)
Ok this is done 

## 2017-12-15 ENCOUNTER — Encounter: Payer: Self-pay | Admitting: Internal Medicine

## 2017-12-15 ENCOUNTER — Encounter (HOSPITAL_COMMUNITY)
Admission: RE | Admit: 2017-12-15 | Discharge: 2017-12-15 | Disposition: A | Discharge status: Home / Self Care | Payer: Self-pay | Source: Ambulatory Visit | Attending: Cardiology | Admitting: Cardiology

## 2017-12-15 NOTE — Telephone Encounter (Signed)
LVM to inform the patient that Dr.Jalyn thought he should stay on the program.

## 2017-12-16 ENCOUNTER — Encounter: Payer: Self-pay | Admitting: Nurse Practitioner

## 2017-12-16 ENCOUNTER — Encounter (HOSPITAL_COMMUNITY)
Admission: RE | Admit: 2017-12-16 | Discharge: 2017-12-16 | Disposition: A | Discharge status: Home / Self Care | Payer: Self-pay | Source: Ambulatory Visit | Attending: Cardiology | Admitting: Cardiology

## 2017-12-16 ENCOUNTER — Encounter: Payer: Self-pay | Admitting: Cardiology

## 2017-12-16 ENCOUNTER — Other Ambulatory Visit: Payer: Self-pay

## 2017-12-17 ENCOUNTER — Telehealth: Payer: Self-pay | Admitting: *Deleted

## 2017-12-17 ENCOUNTER — Telehealth: Payer: Self-pay | Admitting: Cardiology

## 2017-12-17 DIAGNOSIS — Z0289 Encounter for other administrative examinations: Secondary | ICD-10-CM

## 2017-12-17 NOTE — Telephone Encounter (Signed)
Received signed DOT form from [place], [place]. 12/17/17 ab  Patient picked up DOT form. 12/17/17 ab

## 2017-12-17 NOTE — Telephone Encounter (Signed)
LVM advising patient that one page of [place] DMV forms is missing. It is the physician page which must be filled out in addition to the page for his neurologist. Left office number and fax number to this RN's pod.

## 2017-12-17 NOTE — Telephone Encounter (Signed)
Pt DMV form on Mary C desk. 

## 2017-12-18 ENCOUNTER — Telehealth: Payer: Self-pay | Admitting: Nurse Practitioner

## 2017-12-18 ENCOUNTER — Encounter (HOSPITAL_COMMUNITY)
Admission: RE | Admit: 2017-12-18 | Discharge: 2017-12-18 | Disposition: A | Discharge status: Home / Self Care | Payer: Self-pay | Source: Ambulatory Visit | Attending: Cardiology | Admitting: Cardiology

## 2017-12-18 NOTE — Telephone Encounter (Signed)
Replied to patient's my chart this morning informing him this RN had left a VM yesterday. Advised him Daun Peacock, NP needs the physician page of [place] DMV forms to fill out. Patient came into office, spoke with front desk who then spoke to this RN. Explained to front desk that patient needs to provide page 2, physician's page re: patient's medical history. Front desk explained to patient; he left office. This RN checked back with front desk who stated patient did not agree, stated the page this RN is requesting is for his PCP only. Will call patient to discuss.

## 2017-12-18 NOTE — Telephone Encounter (Signed)
Called patient and advised him that the NP needs to page for physician's to complete re: patient's medical history. Advised him that he is seen in this office for seizures and history of hemorrhage. This RN explained that his PCP who filled out that page sees him for different diagnoses. Also advised him the questions on that page differ from the questions on page 7 which has the neurological section. Patient stated he does not have any more blank pages, that he feels the only page this office needs to fill out is the one for neurology. He stated he will come to the office to pick up papers.  This RN advised him she will inform Daun Peacock, NP. He verbalized understanding.

## 2017-12-18 NOTE — Telephone Encounter (Signed)
Please see telephone note dated 12/17/17.

## 2017-12-18 NOTE — Telephone Encounter (Signed)
Discussed with NP; she stated that patient may have papers that are completed. All [place] DMV papers the patient brought to office are at front desk for pick up. Copies sent to medical records for scanning. Patient has paid the fee.

## 2017-12-22 ENCOUNTER — Encounter (HOSPITAL_COMMUNITY)
Admission: RE | Admit: 2017-12-22 | Discharge: 2017-12-22 | Disposition: A | Discharge status: Home / Self Care | Payer: Self-pay | Source: Ambulatory Visit | Attending: Cardiology | Admitting: Cardiology

## 2017-12-23 ENCOUNTER — Encounter (HOSPITAL_COMMUNITY)
Admission: RE | Admit: 2017-12-23 | Discharge: 2017-12-23 | Disposition: A | Discharge status: Home / Self Care | Payer: Self-pay | Source: Ambulatory Visit | Attending: Cardiology | Admitting: Cardiology

## 2017-12-25 ENCOUNTER — Encounter (HOSPITAL_COMMUNITY): Payer: Self-pay

## 2017-12-29 ENCOUNTER — Encounter (HOSPITAL_COMMUNITY)
Admission: RE | Admit: 2017-12-29 | Discharge: 2017-12-29 | Disposition: A | Discharge status: Home / Self Care | Payer: Self-pay | Source: Ambulatory Visit | Attending: Cardiology | Admitting: Cardiology

## 2017-12-30 ENCOUNTER — Encounter (HOSPITAL_COMMUNITY)
Admission: RE | Admit: 2017-12-30 | Discharge: 2017-12-30 | Disposition: A | Discharge status: Home / Self Care | Payer: Self-pay | Source: Ambulatory Visit | Attending: Cardiology | Admitting: Cardiology

## 2017-12-31 ENCOUNTER — Ambulatory Visit (AMBULATORY_SURGERY_CENTER): Payer: Self-pay | Admitting: *Deleted

## 2017-12-31 ENCOUNTER — Other Ambulatory Visit: Payer: Self-pay

## 2017-12-31 VITALS — Ht 73.0 in | Wt 268.0 lb

## 2017-12-31 DIAGNOSIS — Z86010 Personal history of colonic polyps: Secondary | ICD-10-CM

## 2017-12-31 MED ORDER — NA SULFATE-K SULFATE-MG SULF 17.5-3.13-1.6 GM/177ML PO SOLN: 1.0000 [IU] | Freq: Once | ORAL | 0 refills | Status: AC

## 2017-12-31 NOTE — Progress Notes (Signed)
No egg or soy allergy known to patient  No issues with past sedation with any surgeries  or procedures, no intubation problems  No diet pills per patient No home 02 use per patient  No blood thinners per patient  Pt denies issues with constipation  No A fib or A flutter  EMMI video sent to pt's e mail  

## 2018-01-01 ENCOUNTER — Encounter (HOSPITAL_COMMUNITY)
Admission: RE | Admit: 2018-01-01 | Discharge: 2018-01-01 | Disposition: A | Discharge status: Home / Self Care | Payer: Self-pay | Source: Ambulatory Visit | Attending: Cardiology | Admitting: Cardiology

## 2018-01-05 ENCOUNTER — Encounter (HOSPITAL_COMMUNITY)
Admission: RE | Admit: 2018-01-05 | Discharge: 2018-01-05 | Disposition: A | Discharge status: Home / Self Care | Payer: Self-pay | Source: Ambulatory Visit | Attending: Cardiology | Admitting: Cardiology

## 2018-01-06 ENCOUNTER — Encounter (HOSPITAL_COMMUNITY)
Admission: RE | Admit: 2018-01-06 | Discharge: 2018-01-06 | Disposition: A | Discharge status: Home / Self Care | Payer: Self-pay | Source: Ambulatory Visit | Attending: Cardiology | Admitting: Cardiology

## 2018-01-08 ENCOUNTER — Encounter (HOSPITAL_COMMUNITY)
Admission: RE | Admit: 2018-01-08 | Discharge: 2018-01-08 | Disposition: A | Discharge status: Home / Self Care | Payer: Self-pay | Source: Ambulatory Visit | Attending: Cardiology | Admitting: Cardiology

## 2018-01-12 ENCOUNTER — Encounter (HOSPITAL_COMMUNITY)
Admission: RE | Admit: 2018-01-12 | Discharge: 2018-01-12 | Disposition: A | Discharge status: Home / Self Care | Payer: Self-pay | Source: Ambulatory Visit | Attending: Cardiology | Admitting: Cardiology

## 2018-01-12 DIAGNOSIS — I714 Abdominal aortic aneurysm, without rupture: Secondary | ICD-10-CM | POA: Insufficient documentation

## 2018-01-12 DIAGNOSIS — I2510 Atherosclerotic heart disease of native coronary artery without angina pectoris: Secondary | ICD-10-CM | POA: Insufficient documentation

## 2018-01-12 DIAGNOSIS — E7800 Pure hypercholesterolemia, unspecified: Secondary | ICD-10-CM | POA: Insufficient documentation

## 2018-01-12 DIAGNOSIS — I10 Essential (primary) hypertension: Secondary | ICD-10-CM | POA: Insufficient documentation

## 2018-01-13 ENCOUNTER — Encounter (HOSPITAL_COMMUNITY)
Admission: RE | Admit: 2018-01-13 | Discharge: 2018-01-13 | Disposition: A | Discharge status: Home / Self Care | Payer: Medicare Other | Source: Ambulatory Visit | Attending: Cardiology | Admitting: Cardiology

## 2018-01-14 ENCOUNTER — Ambulatory Visit (AMBULATORY_SURGERY_CENTER): Payer: Medicare Other | Admitting: Internal Medicine

## 2018-01-14 ENCOUNTER — Other Ambulatory Visit: Payer: Self-pay

## 2018-01-14 ENCOUNTER — Encounter: Payer: Self-pay | Admitting: Internal Medicine

## 2018-01-14 VITALS — BP 104/80 | HR 62 | Temp 96.2°F | Resp 14 | Ht 73.0 in | Wt 268.0 lb

## 2018-01-14 DIAGNOSIS — D124 Benign neoplasm of descending colon: Secondary | ICD-10-CM | POA: Diagnosis not present

## 2018-01-14 DIAGNOSIS — Z1211 Encounter for screening for malignant neoplasm of colon: Secondary | ICD-10-CM | POA: Diagnosis not present

## 2018-01-14 DIAGNOSIS — Z86010 Personal history of colonic polyps: Secondary | ICD-10-CM

## 2018-01-14 MED ORDER — SODIUM CHLORIDE 0.9 % IV SOLN: 500.0000 mL | Freq: Once | INTRAVENOUS | Status: DC

## 2018-01-14 NOTE — Progress Notes (Signed)
Pt's states no medical or surgical changes since previsit or office visit. 

## 2018-01-14 NOTE — Patient Instructions (Signed)
YOU HAD AN ENDOSCOPIC PROCEDURE TODAY AT [facility]:   Refer to the procedure report that was given to you for any specific questions about what was found during the examination.  If the procedure report does not answer your questions, please call your gastroenterologist to clarify.  If you requested that your care partner not be given the details of your procedure findings, then the procedure report has been included in a sealed envelope for you to review at your convenience later.  YOU SHOULD EXPECT: Some feelings of bloating in the abdomen. Passage of more gas than usual.  Walking can help get rid of the air that was put into your GI tract during the procedure and reduce the bloating. If you had a lower endoscopy (such as a colonoscopy or flexible sigmoidoscopy) you may notice spotting of blood in your stool or on the toilet paper. If you underwent a bowel prep for your procedure, you may not have a normal bowel movement for a few days.  Please Note:  You might notice some irritation and congestion in your nose or some drainage.  This is from the oxygen used during your procedure.  There is no need for concern and it should clear up in a day or so.  SYMPTOMS TO REPORT IMMEDIATELY:   Following lower endoscopy (colonoscopy or flexible sigmoidoscopy):  Excessive amounts of blood in the stool  Significant tenderness or worsening of abdominal pains  Swelling of the abdomen that is new, acute  Fever of 100F or higher  For urgent or emergent issues, a gastroenterologist can be reached at any hour by calling [phone].   DIET:  We do recommend a small meal at first, but then you may proceed to your regular diet.  Drink plenty of fluids but you should avoid alcoholic beverages for 24 hours.  ACTIVITY:  You should plan to take it easy for the rest of today and you should NOT DRIVE or use heavy machinery until tomorrow (because of the sedation medicines used during the test).     FOLLOW UP: Our staff will call the number listed on your records the next business day following your procedure to check on you and address any questions or concerns that you may have regarding the information given to you following your procedure. If we do not reach you, we will leave a message.  However, if you are feeling well and you are not experiencing any problems, there is no need to return our call.  We will assume that you have returned to your regular daily activities without incident.  If any biopsies were taken you will be contacted by phone or by letter within the next 1-3 weeks.  Please call us at [phone] if you have not heard about the biopsies in 3 weeks.   SIGNATURES/CONFIDENTIALITY: You and/or your care partner have signed paperwork which will be entered into your electronic medical record.  These signatures attest to the fact that that the information above on your After Visit Summary has been reviewed and is understood.  Full responsibility of the confidentiality of this discharge information lies with you and/or your care-partner.  Await pathology  Please read over handouts about polyps, diverticulosis and high fiber diets  Continue your normal medications

## 2018-01-14 NOTE — Progress Notes (Signed)
Called to room to assist during endoscopic procedure.  Patient ID and intended procedure confirmed with present staff. Received instructions for my participation in the procedure from the performing physician.  

## 2018-01-14 NOTE — Op Note (Signed)
[facility] Patient Name: Allen Myers Procedure Date: 01/14/2018 10:35 AM MRN: [id number] Endoscopist: Docia Chuck. Henrene Pastor , MD Age: 72 Referring MD:  Date of Birth: 1946-08-31 Gender: Male Account #: [id number] Procedure:                Colonoscopy, with cold snare polypectomy x 1 Indications:              High risk colon cancer surveillance: Personal                            history of non-advanced adenoma. Previous                            examinations 2003, 2008, 2014 Medicines:                Monitored Anesthesia Care Procedure:                Pre-Anesthesia Assessment:                           - Prior to the procedure, a History and Physical                            was performed, and patient medications and                            allergies were reviewed. The patient's tolerance of                            previous anesthesia was also reviewed. The risks                            and benefits of the procedure and the sedation                            options and risks were discussed with the patient.                            All questions were answered, and informed consent                            was obtained. Prior Anticoagulants: The patient has                            taken no previous anticoagulant or antiplatelet                            agents. ASA Grade Assessment: II - A patient with                            mild systemic disease. After reviewing the risks                            and benefits, the patient was deemed in  satisfactory condition to undergo the procedure.                           After obtaining informed consent, the colonoscope                            was passed under direct vision. Throughout the                            procedure, the patient's blood pressure, pulse, and                            oxygen saturations were monitored continuously. The                            Colonoscope  was introduced through the anus and                            advanced to the the cecum, identified by                            appendiceal orifice and ileocecal valve. The                            ileocecal valve, appendiceal orifice, and rectum                            were photographed. The quality of the bowel                            preparation was good. The colonoscopy was performed                            without difficulty. The patient tolerated the                            procedure well. The bowel preparation used was                            SUPREP. Scope In: 10:38:12 AM Scope Out: 10:53:58 AM Scope Withdrawal Time: 0 hours 9 minutes 5 seconds  Total Procedure Duration: 0 hours 15 minutes 46 seconds  Findings:                 A 2 mm polyp was found in the descending colon. The                            polyp was removed with a cold snare. Resection and                            retrieval were complete.                           Multiple diverticula were found in the left colon.  The exam was otherwise without abnormality on                            direct and retroflexion views. Complications:            No immediate complications. Estimated blood loss:                            None. Estimated Blood Loss:     Estimated blood loss: none. Impression:               - One 2 mm polyp in the descending colon, removed                            with a cold snare. Resected and retrieved.                           - Diverticulosis in the left colon.                           - The examination was otherwise normal on direct                            and retroflexion views. Recommendation:           - Repeat colonoscopy in 5 years for surveillance,                            if adenomatous. Otherwise no routine follow-up                            planned.                           - Patient has a contact number available for                             emergencies. The signs and symptoms of potential                            delayed complications were discussed with the                            patient. Return to normal activities tomorrow.                            Written discharge instructions were provided to the                            patient.                           - Resume previous diet.                           - Continue present medications.                           -  Await pathology results. Docia Chuck. Henrene Pastor, MD 01/14/2018 11:00:16 AM This report has been signed electronically.

## 2018-01-14 NOTE — Progress Notes (Signed)
A and O x3. Report to RN. Tolerated MAC anesthesia well.

## 2018-01-15 ENCOUNTER — Encounter (HOSPITAL_COMMUNITY)
Admission: RE | Admit: 2018-01-15 | Discharge: 2018-01-15 | Disposition: A | Discharge status: Home / Self Care | Payer: Medicare Other | Source: Ambulatory Visit | Attending: Cardiology | Admitting: Cardiology

## 2018-01-15 ENCOUNTER — Telehealth: Payer: Self-pay

## 2018-01-15 NOTE — Telephone Encounter (Signed)
  Follow up Call-  Call back number 01/14/2018 02/02/2016  Post procedure Call Back phone  # [phone]  Permission to leave phone message Yes Yes  Some recent data might be hidden     Patient questions:  Do you have a fever, pain , or abdominal swelling? No. Pain Score  0 *  Have you tolerated food without any problems? Yes.    Have you been able to return to your normal activities? Yes.    Do you have any questions about your discharge instructions: Diet   No. Medications  No. Follow up visit  No.  Do you have questions or concerns about your Care? No.  Actions: * If pain score is 4 or above: No action needed, pain <4.  No problems noted per pt. maw

## 2018-01-19 ENCOUNTER — Encounter (HOSPITAL_COMMUNITY): Payer: Self-pay

## 2018-01-20 ENCOUNTER — Encounter: Payer: Self-pay | Admitting: Internal Medicine

## 2018-01-20 ENCOUNTER — Ambulatory Visit (INDEPENDENT_AMBULATORY_CARE_PROVIDER_SITE_OTHER): Payer: Medicare Other | Admitting: Internal Medicine

## 2018-01-20 ENCOUNTER — Encounter (HOSPITAL_COMMUNITY)
Admission: RE | Admit: 2018-01-20 | Discharge: 2018-01-20 | Disposition: A | Discharge status: Home / Self Care | Payer: Self-pay | Source: Ambulatory Visit | Attending: Cardiology | Admitting: Cardiology

## 2018-01-20 ENCOUNTER — Ambulatory Visit (INDEPENDENT_AMBULATORY_CARE_PROVIDER_SITE_OTHER)
Admission: RE | Admit: 2018-01-20 | Discharge: 2018-01-20 | Disposition: A | Discharge status: Home / Self Care | Payer: Medicare Other | Source: Ambulatory Visit | Attending: Internal Medicine | Admitting: Internal Medicine

## 2018-01-20 VITALS — BP 120/60 | HR 78 | Temp 99.4°F | Resp 16 | Ht 73.0 in | Wt 262.2 lb

## 2018-01-20 DIAGNOSIS — I2510 Atherosclerotic heart disease of native coronary artery without angina pectoris: Secondary | ICD-10-CM

## 2018-01-20 DIAGNOSIS — J988 Other specified respiratory disorders: Secondary | ICD-10-CM | POA: Insufficient documentation

## 2018-01-20 DIAGNOSIS — R05 Cough: Secondary | ICD-10-CM | POA: Diagnosis not present

## 2018-01-20 DIAGNOSIS — J301 Allergic rhinitis due to pollen: Secondary | ICD-10-CM | POA: Diagnosis not present

## 2018-01-20 DIAGNOSIS — R059 Cough, unspecified: Secondary | ICD-10-CM

## 2018-01-20 LAB — POCT EXHALED NITRIC OXIDE: FeNO level (ppb): 25

## 2018-01-20 MED ORDER — HYDROCODONE-HOMATROPINE 5-1.5 MG/5ML PO SYRP: 5.0000 mL | ORAL_SOLUTION | Freq: Three times a day (TID) | ORAL | 0 refills | Status: DC | PRN

## 2018-01-20 MED ORDER — CEFDINIR 300 MG PO CAPS: 300.0000 mg | ORAL_CAPSULE | Freq: Two times a day (BID) | ORAL | 0 refills | Status: DC

## 2018-01-20 MED ORDER — METHYLPREDNISOLONE 4 MG PO TBPK: ORAL_TABLET | ORAL | 0 refills | Status: AC

## 2018-01-20 NOTE — Progress Notes (Signed)
Subjective:  Patient ID: Allen Myers, male    DOB: [birth year]  Age: 72 y.o. MRN: [id number]  CC: Cough   HPI Allen Myers presents for a 4-day history of cough that is productive of thick green phlegm with night sweats, sore throat, sneezing, and severe nasal congestion.  The cough is keeping him awake at night.  Outpatient Medications Prior to Visit  Medication Sig Dispense Refill  . Ascorbic Acid (VITAMIN C PO) Take 1 tablet by mouth.    Marland Kitchen aspirin EC 81 MG tablet Take 81 mg by mouth daily.    Marland Kitchen atorvastatin (LIPITOR) 80 MG tablet Take 1 tablet (80 mg total) by mouth daily. 90 tablet 3  . carvedilol (COREG) 3.125 MG tablet TAKE 1 TABLET BY MOUTH TWICE A DAY WITH A MEAL 180 tablet 3  . levETIRAcetam (KEPPRA) 1000 MG tablet Take 1 tablet (1,000 mg total) by mouth 2 (two) times daily. 180 tablet 3  . levothyroxine (SYNTHROID, LEVOTHROID) 150 MCG tablet Take 1 tablet (150 mcg total) by mouth daily. 90 tablet 3  . linagliptin (TRADJENTA) 5 MG TABS tablet Take 1 tablet (5 mg total) by mouth daily. 90 tablet 3  . lisinopril (PRINIVIL,ZESTRIL) 2.5 MG tablet Take 1 tablet (2.5 mg total) by mouth daily. 90 tablet 3  . metFORMIN (GLUCOPHAGE) 1000 MG tablet TAKE 1 TABLET BY MOUTH TWICE A DAY WITH A MEAL 180 tablet 3  . omeprazole (PRILOSEC) 40 MG capsule Take 1 capsule (40 mg total) by mouth daily. 90 capsule 3  . ONE TOUCH ULTRA TEST test strip USE TO CHECK BLOOD SUGAR TWICE A DAY AS DIRECTED 100 each 0  . pioglitazone (ACTOS) 30 MG tablet Take 1 tablet (30 mg total) by mouth daily. 90 tablet 3  . nitroGLYCERIN (NITROSTAT) 0.4 MG SL tablet Place 1 tablet (0.4 mg total) under the tongue every 5 (five) minutes as needed for chest pain. (Patient not taking: Reported on 01/20/2018) 25 tablet 12   Facility-Administered Medications Prior to Visit  Medication Dose Route Frequency Provider Last Rate Last Dose  . 0.9 %  sodium chloride infusion  500 mL Intravenous Once Irene Shipper, MD         ROS Review of Systems  Constitutional: Negative for chills, fatigue, fever and unexpected weight change.  HENT: Positive for congestion, postnasal drip, rhinorrhea and sore throat. Negative for facial swelling, sinus pressure, sinus pain, trouble swallowing and voice change.   Respiratory: Positive for cough. Negative for chest tightness, shortness of breath and wheezing.   Cardiovascular: Negative for chest pain, palpitations and leg swelling.  Gastrointestinal: Negative for abdominal pain, constipation, diarrhea, nausea and vomiting.  Endocrine: Negative.   Genitourinary: Negative.  Negative for difficulty urinating.  Musculoskeletal: Negative.  Negative for back pain, myalgias and neck pain.  Skin: Negative for color change, pallor and rash.  Neurological: Negative.  Negative for dizziness, weakness, light-headedness and headaches.  Hematological: Negative for adenopathy. Does not bruise/bleed easily.  Psychiatric/Behavioral: Negative.     Objective:  BP 120/60 (BP Location: Left Arm, Patient Position: Sitting, Cuff Size: Large)   Pulse 78   Temp 99.4 F (37.4 C) (Oral)   Resp 16   Ht 6\' 1"  (1.854 m)   Wt 262 lb 4 oz (119 kg)   SpO2 95%   BMI 34.60 kg/m   BP Readings from Last 3 Encounters:  01/20/18 120/60  01/14/18 104/80  11/20/17 118/76    Wt Readings from Last 3 Encounters:  01/20/18 262  lb 4 oz (119 kg)  01/14/18 268 lb (121.6 kg)  12/31/17 268 lb (121.6 kg)    Physical Exam  Constitutional: He is oriented to person, place, and time. No distress.  HENT:  Nose: Mucosal edema and rhinorrhea present. No epistaxis. Right sinus exhibits no maxillary sinus tenderness and no frontal sinus tenderness. Left sinus exhibits no maxillary sinus tenderness and no frontal sinus tenderness.  Mouth/Throat: Mucous membranes are normal. Mucous membranes are not pale and not dry. Posterior oropharyngeal erythema present. No oropharyngeal exudate, posterior oropharyngeal edema  or tonsillar abscesses.  Neck: Normal range of motion. Neck supple. No JVD present. No thyromegaly present.  Cardiovascular: Normal rate, regular rhythm and normal heart sounds. Exam reveals no gallop and no friction rub.  No murmur heard. Pulmonary/Chest: Effort normal and breath sounds normal. No stridor. No respiratory distress. He has no wheezes. He has no rales.  Abdominal: Soft. Bowel sounds are normal. He exhibits no mass. There is no tenderness. No hernia.  Musculoskeletal: Normal range of motion. He exhibits no edema, tenderness or deformity.  Lymphadenopathy:    He has no cervical adenopathy.  Neurological: He is alert and oriented to person, place, and time.  Skin: Skin is warm and dry. No rash noted. He is not diaphoretic.  Vitals reviewed.   Lab Results  Component Value Date   WBC 7.5 01/28/2017   HGB 14.6 01/28/2017   HCT 44.1 01/28/2017   PLT 152.0 01/28/2017   GLUCOSE 118 (H) 11/18/2017   CHOL 94 05/16/2017   TRIG 143.0 05/16/2017   HDL 35.40 (L) 05/16/2017   LDLDIRECT 85.7 06/11/2013   LDLCALC 30 05/16/2017   ALT 19 05/16/2017   AST 16 05/16/2017   NA 138 11/18/2017   K 4.5 11/18/2017   CL 103 11/18/2017   CREATININE 0.89 11/18/2017   BUN 15 11/18/2017   CO2 27 11/18/2017   TSH 1.10 05/16/2017   PSA 2.13 01/28/2017   INR 1.07 09/06/2014   HGBA1C 7.5 (H) 11/18/2017   MICROALBUR 1.0 01/28/2017    Dg Chest 2 View  Result Date: 01/20/2018 CLINICAL DATA:  Cough, congestion, body aches for 3 days, former smoking history EXAM: CHEST - 2 VIEW COMPARISON:  CT chest of 01/16/2016 and chest x-ray of the same day FINDINGS: No active infiltrate or effusion is seen. Mild apical pleural thickening appears stable. Mediastinal and hilar contours are unremarkable. The heart is within upper limits of normal. No acute bony abnormality is seen. IMPRESSION: No active cardiopulmonary disease. Electronically Signed   By: Ivar Drape M.D.   On: 01/20/2018 15:46    Assessment &  Plan:   Allen Myers was seen today for cough.  Diagnoses and all orders for this visit:  Cough- His FeNO score is not significantly elevated so I do not think he has an allergic or eosinophilic condition.  His chest x-ray is negative for mass or infiltrate. -     POCT EXHALED NITRIC OXIDE -     HYDROcodone-homatropine (HYCODAN) 5-1.5 MG/5ML syrup; Take 5 mLs by mouth every 8 (eight) hours as needed for cough. -     DG Chest 2 View; Future  RTI (respiratory tract infection)- I will treat the infection with Omnicef.  Will control the cough with Hycodan. -     HYDROcodone-homatropine (HYCODAN) 5-1.5 MG/5ML syrup; Take 5 mLs by mouth every 8 (eight) hours as needed for cough. -     cefdinir (OMNICEF) 300 MG capsule; Take 1 capsule (300 mg total) by mouth 2 (  two) times daily.  Seasonal allergic rhinitis due to pollen- He is having a severe flareup of his symptoms so will treat with a course of systemic steroids. -     methylPREDNISolone (MEDROL DOSEPAK) 4 MG TBPK tablet; TAKE AS DIRECTED   I am having [name] start on HYDROcodone-homatropine, cefdinir, and methylPREDNISolone. I am also having him maintain his aspirin EC, ONE TOUCH ULTRA TEST, atorvastatin, carvedilol, levETIRAcetam, linagliptin, lisinopril, metFORMIN, nitroGLYCERIN, omeprazole, pioglitazone, levothyroxine, and Ascorbic Acid (VITAMIN C PO). We will continue to administer sodium chloride.  Meds ordered this encounter  Medications  . HYDROcodone-homatropine (HYCODAN) 5-1.5 MG/5ML syrup    Sig: Take 5 mLs by mouth every 8 (eight) hours as needed for cough.    Dispense:  120 mL    Refill:  0  . cefdinir (OMNICEF) 300 MG capsule    Sig: Take 1 capsule (300 mg total) by mouth 2 (two) times daily.    Dispense:  20 capsule    Refill:  0  . methylPREDNISolone (MEDROL DOSEPAK) 4 MG TBPK tablet    Sig: TAKE AS DIRECTED    Dispense:  21 tablet    Refill:  0     Follow-up: Return in about 3 weeks (around 02/10/2018).  Scarlette Calico, MD

## 2018-01-20 NOTE — Patient Instructions (Signed)
Cough, Adult  Coughing is a reflex that clears your throat and your airways. Coughing helps to heal and protect your lungs. It is normal to cough occasionally, but a cough that happens with other symptoms or lasts a long time may be a sign of a condition that needs treatment. A cough may last only 2-3 weeks (acute), or it may last longer than 8 weeks (chronic).  What are the causes?  Coughing is commonly caused by:   Breathing in substances that irritate your lungs.   A viral or bacterial respiratory infection.   Allergies.   Asthma.   Postnasal drip.   Smoking.   Acid backing up from the stomach into the esophagus (gastroesophageal reflux).   Certain medicines.   Chronic lung problems, including COPD (or rarely, lung cancer).   Other medical conditions such as heart failure.    Follow these instructions at home:  Pay attention to any changes in your symptoms. Take these actions to help with your discomfort:   Take medicines only as told by your health care provider.  ? If you were prescribed an antibiotic medicine, take it as told by your health care provider. Do not stop taking the antibiotic even if you start to feel better.  ? Talk with your health care provider before you take a cough suppressant medicine.   Drink enough fluid to keep your urine clear or pale yellow.   If the air is dry, use a cold steam vaporizer or humidifier in your bedroom or your home to help loosen secretions.   Avoid anything that causes you to cough at work or at home.   If your cough is worse at night, try sleeping in a semi-upright position.   Avoid cigarette smoke. If you smoke, quit smoking. If you need help quitting, ask your health care provider.   Avoid caffeine.   Avoid alcohol.   Rest as needed.    Contact a health care provider if:   You have new symptoms.   You cough up pus.   Your cough does not get better after 2-3 weeks, or your cough gets worse.   You cannot control your cough with suppressant  medicines and you are losing sleep.   You develop pain that is getting worse or pain that is not controlled with pain medicines.   You have a fever.   You have unexplained weight loss.   You have night sweats.  Get help right away if:   You cough up blood.   You have difficulty breathing.   Your heartbeat is very fast.  This information is not intended to replace advice given to you by your health care provider. Make sure you discuss any questions you have with your health care provider.  Document Released: 03/29/2011 Document Revised: 03/07/2016 Document Reviewed: 12/07/2014  [employer] Interactive Patient Education  2018 [employer]

## 2018-01-21 ENCOUNTER — Other Ambulatory Visit: Payer: Self-pay | Admitting: Internal Medicine

## 2018-01-21 ENCOUNTER — Encounter: Payer: Self-pay | Admitting: Internal Medicine

## 2018-01-22 ENCOUNTER — Encounter (HOSPITAL_COMMUNITY): Payer: Self-pay

## 2018-01-26 ENCOUNTER — Encounter (HOSPITAL_COMMUNITY)
Admission: RE | Admit: 2018-01-26 | Discharge: 2018-01-26 | Disposition: A | Discharge status: Home / Self Care | Payer: Self-pay | Source: Ambulatory Visit | Attending: Cardiology | Admitting: Cardiology

## 2018-01-27 ENCOUNTER — Encounter (HOSPITAL_COMMUNITY)
Admission: RE | Admit: 2018-01-27 | Discharge: 2018-01-27 | Disposition: A | Discharge status: Home / Self Care | Payer: Self-pay | Source: Ambulatory Visit | Attending: Cardiology | Admitting: Cardiology

## 2018-01-29 ENCOUNTER — Encounter (HOSPITAL_COMMUNITY): Payer: Self-pay

## 2018-02-02 ENCOUNTER — Encounter (HOSPITAL_COMMUNITY): Payer: Self-pay

## 2018-02-03 ENCOUNTER — Encounter (HOSPITAL_COMMUNITY): Payer: Self-pay

## 2018-02-05 ENCOUNTER — Ambulatory Visit: Payer: Medicare Other | Admitting: Nurse Practitioner

## 2018-02-05 ENCOUNTER — Encounter (HOSPITAL_COMMUNITY): Payer: Self-pay

## 2018-02-09 ENCOUNTER — Encounter (HOSPITAL_COMMUNITY): Payer: Self-pay
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
## 2018-02-10 ENCOUNTER — Encounter: Payer: Self-pay | Admitting: Nurse Practitioner

## 2018-02-10 ENCOUNTER — Encounter (HOSPITAL_COMMUNITY): Payer: Self-pay

## 2018-02-10 ENCOUNTER — Ambulatory Visit (INDEPENDENT_AMBULATORY_CARE_PROVIDER_SITE_OTHER): Payer: Medicare Other | Admitting: Nurse Practitioner

## 2018-02-10 VITALS — BP 104/65 | HR 71 | Ht 73.0 in | Wt 268.0 lb

## 2018-02-10 DIAGNOSIS — I10 Essential (primary) hypertension: Secondary | ICD-10-CM

## 2018-02-10 DIAGNOSIS — E119 Type 2 diabetes mellitus without complications: Secondary | ICD-10-CM | POA: Diagnosis not present

## 2018-02-10 DIAGNOSIS — R569 Unspecified convulsions: Secondary | ICD-10-CM

## 2018-02-10 DIAGNOSIS — Z8679 Personal history of other diseases of the circulatory system: Secondary | ICD-10-CM | POA: Insufficient documentation

## 2018-02-10 DIAGNOSIS — E785 Hyperlipidemia, unspecified: Secondary | ICD-10-CM | POA: Diagnosis not present

## 2018-02-10 DIAGNOSIS — I2510 Atherosclerotic heart disease of native coronary artery without angina pectoris: Secondary | ICD-10-CM

## 2018-02-10 MED ORDER — LEVETIRACETAM 1000 MG PO TABS: 1000.0000 mg | ORAL_TABLET | Freq: Two times a day (BID) | ORAL | 3 refills | Status: DC

## 2018-02-10 NOTE — Patient Instructions (Signed)
continue ASA 81mg  and lipitor for stroke and cardiac prevention. continue keppra 1000mg  bid. For seizure disorder , Call  for seizure activity Continue to follow up with Dr. Jenny Reichmann for stroke risk factor modification.  Recommend maintain blood pressure goal around 130/80, todays reading 104/65 Stay well hydrated diabetes with hemoglobin A1c goal below 6.5% continue medications and lipids with LDL cholesterol goal below 70 mg/dL. Continue cardiac rehabilitation and exercise at [facility] Follow up yearly

## 2018-02-12 ENCOUNTER — Encounter (HOSPITAL_COMMUNITY): Payer: Self-pay

## 2018-02-12 DIAGNOSIS — I714 Abdominal aortic aneurysm, without rupture: Secondary | ICD-10-CM | POA: Insufficient documentation

## 2018-02-12 DIAGNOSIS — I10 Essential (primary) hypertension: Secondary | ICD-10-CM | POA: Insufficient documentation

## 2018-02-12 DIAGNOSIS — E7800 Pure hypercholesterolemia, unspecified: Secondary | ICD-10-CM | POA: Insufficient documentation

## 2018-02-12 DIAGNOSIS — I2510 Atherosclerotic heart disease of native coronary artery without angina pectoris: Secondary | ICD-10-CM | POA: Insufficient documentation

## 2018-02-13 NOTE — Progress Notes (Signed)
I reviewed above note and agree with the assessment and plan.    Rosalin Hawking, MD PhD Stroke Neurology 02/13/2018 4:27 PM

## 2018-02-16 ENCOUNTER — Encounter (HOSPITAL_COMMUNITY): Payer: Self-pay

## 2018-02-17 ENCOUNTER — Encounter (HOSPITAL_COMMUNITY): Payer: Self-pay

## 2018-02-19 ENCOUNTER — Encounter (HOSPITAL_COMMUNITY): Payer: Self-pay

## 2018-02-23 ENCOUNTER — Encounter (HOSPITAL_COMMUNITY): Payer: Self-pay

## 2018-02-24 ENCOUNTER — Encounter (HOSPITAL_COMMUNITY)
Admission: RE | Admit: 2018-02-24 | Discharge: 2018-02-24 | Disposition: A | Discharge status: Home / Self Care | Payer: Self-pay | Source: Ambulatory Visit | Attending: Cardiology | Admitting: Cardiology

## 2018-02-26 ENCOUNTER — Encounter (HOSPITAL_COMMUNITY)
Admission: RE | Admit: 2018-02-26 | Discharge: 2018-02-26 | Disposition: A | Discharge status: Home / Self Care | Payer: Self-pay | Source: Ambulatory Visit | Attending: Cardiology | Admitting: Cardiology

## 2018-03-02 ENCOUNTER — Encounter (HOSPITAL_COMMUNITY)
Admission: RE | Admit: 2018-03-02 | Discharge: 2018-03-02 | Disposition: A | Discharge status: Home / Self Care | Payer: Self-pay | Source: Ambulatory Visit | Attending: Cardiology | Admitting: Cardiology

## 2018-03-03 ENCOUNTER — Encounter (HOSPITAL_COMMUNITY)
Admission: RE | Admit: 2018-03-03 | Discharge: 2018-03-03 | Disposition: A | Discharge status: Home / Self Care | Payer: Self-pay | Source: Ambulatory Visit | Attending: Cardiology | Admitting: Cardiology

## 2018-03-04 ENCOUNTER — Other Ambulatory Visit: Payer: Self-pay | Admitting: *Deleted

## 2018-03-04 ENCOUNTER — Ambulatory Visit (HOSPITAL_COMMUNITY)
Admission: RE | Admit: 2018-03-04 | Discharge: 2018-03-04 | Disposition: A | Discharge status: Home / Self Care | Payer: Medicare Other | Source: Ambulatory Visit | Attending: Cardiology | Admitting: Cardiology

## 2018-03-04 DIAGNOSIS — I714 Abdominal aortic aneurysm, without rupture, unspecified: Secondary | ICD-10-CM

## 2018-03-04 DIAGNOSIS — I708 Atherosclerosis of other arteries: Secondary | ICD-10-CM | POA: Insufficient documentation

## 2018-03-04 DIAGNOSIS — I700 Atherosclerosis of aorta: Secondary | ICD-10-CM | POA: Insufficient documentation

## 2018-03-05 ENCOUNTER — Encounter (HOSPITAL_COMMUNITY): Payer: Self-pay

## 2018-03-06 ENCOUNTER — Encounter (HOSPITAL_COMMUNITY)
Admission: RE | Admit: 2018-03-06 | Discharge: 2018-03-06 | Disposition: A | Discharge status: Home / Self Care | Payer: Self-pay | Source: Ambulatory Visit | Attending: Cardiology | Admitting: Cardiology

## 2018-03-10 ENCOUNTER — Encounter (HOSPITAL_COMMUNITY): Payer: Self-pay

## 2018-03-12 ENCOUNTER — Encounter (HOSPITAL_COMMUNITY): Payer: Self-pay

## 2018-03-16 ENCOUNTER — Encounter (HOSPITAL_COMMUNITY)
Admission: RE | Admit: 2018-03-16 | Discharge: 2018-03-16 | Disposition: A | Discharge status: Home / Self Care | Payer: Self-pay | Source: Ambulatory Visit | Attending: Cardiology | Admitting: Cardiology

## 2018-03-16 DIAGNOSIS — I714 Abdominal aortic aneurysm, without rupture: Secondary | ICD-10-CM | POA: Insufficient documentation

## 2018-03-16 DIAGNOSIS — I2510 Atherosclerotic heart disease of native coronary artery without angina pectoris: Secondary | ICD-10-CM | POA: Insufficient documentation

## 2018-03-16 DIAGNOSIS — I10 Essential (primary) hypertension: Secondary | ICD-10-CM | POA: Insufficient documentation

## 2018-03-16 DIAGNOSIS — E7800 Pure hypercholesterolemia, unspecified: Secondary | ICD-10-CM | POA: Insufficient documentation

## 2018-03-17 ENCOUNTER — Encounter (HOSPITAL_COMMUNITY): Payer: Self-pay

## 2018-03-18 ENCOUNTER — Encounter (HOSPITAL_COMMUNITY)
Admission: RE | Admit: 2018-03-18 | Discharge: 2018-03-18 | Disposition: A | Discharge status: Home / Self Care | Payer: Self-pay | Source: Ambulatory Visit | Attending: Cardiology | Admitting: Cardiology

## 2018-03-19 ENCOUNTER — Encounter (HOSPITAL_COMMUNITY): Payer: Self-pay

## 2018-03-23 ENCOUNTER — Encounter (HOSPITAL_COMMUNITY): Payer: Self-pay

## 2018-03-24 ENCOUNTER — Encounter (HOSPITAL_COMMUNITY): Payer: Self-pay

## 2018-03-26 ENCOUNTER — Encounter (HOSPITAL_COMMUNITY): Payer: Self-pay

## 2018-03-30 ENCOUNTER — Encounter (HOSPITAL_COMMUNITY): Payer: Self-pay

## 2018-03-31 ENCOUNTER — Encounter (HOSPITAL_COMMUNITY): Payer: Self-pay

## 2018-04-02 ENCOUNTER — Encounter (HOSPITAL_COMMUNITY): Payer: Self-pay

## 2018-04-06 ENCOUNTER — Encounter (HOSPITAL_COMMUNITY): Payer: Self-pay

## 2018-04-07 ENCOUNTER — Encounter (HOSPITAL_COMMUNITY): Payer: Self-pay

## 2018-04-08 ENCOUNTER — Encounter (HOSPITAL_COMMUNITY)
Admission: RE | Admit: 2018-04-08 | Discharge: 2018-04-08 | Disposition: A | Discharge status: Home / Self Care | Payer: Self-pay | Source: Ambulatory Visit | Attending: Cardiology | Admitting: Cardiology

## 2018-04-09 ENCOUNTER — Encounter (HOSPITAL_COMMUNITY)
Admission: RE | Admit: 2018-04-09 | Discharge: 2018-04-09 | Disposition: A | Discharge status: Home / Self Care | Payer: Self-pay | Source: Ambulatory Visit | Attending: Cardiology | Admitting: Cardiology

## 2018-04-10 ENCOUNTER — Encounter (HOSPITAL_COMMUNITY)
Admission: RE | Admit: 2018-04-10 | Discharge: 2018-04-10 | Disposition: A | Discharge status: Home / Self Care | Payer: Self-pay | Source: Ambulatory Visit | Attending: Cardiology | Admitting: Cardiology

## 2018-04-13 ENCOUNTER — Encounter (HOSPITAL_COMMUNITY): Payer: Self-pay

## 2018-04-13 DIAGNOSIS — I10 Essential (primary) hypertension: Secondary | ICD-10-CM | POA: Insufficient documentation

## 2018-04-13 DIAGNOSIS — I2510 Atherosclerotic heart disease of native coronary artery without angina pectoris: Secondary | ICD-10-CM | POA: Insufficient documentation

## 2018-04-13 DIAGNOSIS — I714 Abdominal aortic aneurysm, without rupture: Secondary | ICD-10-CM | POA: Insufficient documentation

## 2018-04-13 DIAGNOSIS — E7800 Pure hypercholesterolemia, unspecified: Secondary | ICD-10-CM | POA: Insufficient documentation

## 2018-04-14 ENCOUNTER — Encounter (HOSPITAL_COMMUNITY): Payer: Self-pay

## 2018-04-20 ENCOUNTER — Encounter (HOSPITAL_COMMUNITY): Payer: Self-pay

## 2018-04-21 ENCOUNTER — Encounter (HOSPITAL_COMMUNITY): Payer: Self-pay

## 2018-04-22 ENCOUNTER — Telehealth: Payer: Self-pay | Admitting: Internal Medicine

## 2018-04-22 MED ORDER — ATORVASTATIN CALCIUM 80 MG PO TABS: 80.0000 mg | ORAL_TABLET | Freq: Every day | ORAL | 0 refills | Status: DC

## 2018-04-22 MED ORDER — METFORMIN HCL 1000 MG PO TABS: ORAL_TABLET | ORAL | 0 refills | Status: DC

## 2018-04-22 MED ORDER — LINAGLIPTIN 5 MG PO TABS: 5.0000 mg | ORAL_TABLET | Freq: Every day | ORAL | 0 refills | Status: DC

## 2018-04-22 MED ORDER — LEVOTHYROXINE SODIUM 150 MCG PO TABS: 150.0000 ug | ORAL_TABLET | Freq: Every day | ORAL | 0 refills | Status: DC

## 2018-04-22 MED ORDER — LEVETIRACETAM 1000 MG PO TABS: 1000.0000 mg | ORAL_TABLET | Freq: Two times a day (BID) | ORAL | 0 refills | Status: DC

## 2018-04-22 MED ORDER — CARVEDILOL 3.125 MG PO TABS: ORAL_TABLET | ORAL | 0 refills | Status: DC

## 2018-04-22 NOTE — Telephone Encounter (Signed)
Copied from [place] [id number]. Topic: Quick Communication - Rx Refill/Question >> Apr 22, 2018  9:35 AM Waylan Rocher, Lumin L wrote: Medication: atorvastatin (LIPITOR) 80 MG tablet, carvedilol (COREG) 3.125 MG tablet, levETIRAcetam (KEPPRA) 1000 MG tablet, levothyroxine (SYNTHROID, LEVOTHROID) 150 MCG tablet, linagliptin (TRADJENTA) 5 Mg, TABS tablet, metFORMIN (GLUCOPHAGE) 1000 MG tablet, pioglitazone (ACTOS) 30 MG tablet (forget medications home and is out of state requesting a 3 days supply of each)  Has the patient contacted their pharmacy? Yes.   (Agent: If no, request that the patient contact the pharmacy for the refill.) (Agent: If yes, when and what did the pharmacy advise?)  Preferred Pharmacy (with phone number or street name): [id number] [place], [address] [place] [address] Phone: [phone] Fax: [phone]  Agent: Please be advised that RX refills may take up to 3 business days. We ask that you follow-up with your pharmacy.

## 2018-04-23 ENCOUNTER — Encounter (HOSPITAL_COMMUNITY): Payer: Self-pay

## 2018-04-27 ENCOUNTER — Encounter (HOSPITAL_COMMUNITY)
Admission: RE | Admit: 2018-04-27 | Discharge: 2018-04-27 | Disposition: A | Discharge status: Home / Self Care | Payer: Self-pay | Source: Ambulatory Visit | Attending: Cardiology | Admitting: Cardiology

## 2018-04-27 ENCOUNTER — Encounter: Payer: Self-pay | Admitting: Internal Medicine

## 2018-04-27 MED ORDER — LINAGLIPTIN 5 MG PO TABS: 5.0000 mg | ORAL_TABLET | Freq: Every day | ORAL | 0 refills | Status: DC

## 2018-04-27 MED ORDER — CARVEDILOL 3.125 MG PO TABS: ORAL_TABLET | ORAL | 0 refills | Status: DC

## 2018-04-27 MED ORDER — LEVOTHYROXINE SODIUM 150 MCG PO TABS: 150.0000 ug | ORAL_TABLET | Freq: Every day | ORAL | 0 refills | Status: DC

## 2018-04-27 MED ORDER — METFORMIN HCL 1000 MG PO TABS: ORAL_TABLET | ORAL | 0 refills | Status: DC

## 2018-04-27 MED ORDER — ATORVASTATIN CALCIUM 80 MG PO TABS: 80.0000 mg | ORAL_TABLET | Freq: Every day | ORAL | 0 refills | Status: DC

## 2018-04-27 MED ORDER — TIZANIDINE HCL 2 MG PO TABS: 2.0000 mg | ORAL_TABLET | Freq: Every evening | ORAL | 5 refills | Status: DC | PRN

## 2018-04-27 NOTE — Telephone Encounter (Signed)
Ok for tizan 2 mg qhs prn - done erx

## 2018-04-27 NOTE — Telephone Encounter (Signed)
Patient is now needing his usual 3 months supply sent to  [place] [place] [facility]  [place] [address]  Phone: [phone] Fax: [phone]   atorvastatin (LIPITOR) 80 MG tablet carvedilol (COREG) 3.125 MG tablet levETIRAcetam (KEPPRA) 1000 MG tablet levothyroxine (SYNTHROID, LEVOTHROID) 150 MCG tablet linagliptin (TRADJENTA) 5 Mg, TABS tablet metFORMIN (GLUCOPHAGE) 1000 MG tablet pioglitazone (ACTOS) 30 MG tablet lisinopril (PRINIVIL,ZESTRIL) 2.5 MG tablet   Patient received a short supply while out of town and now needs these all renewed [facility]. Patient says to call if you have any questions.

## 2018-04-27 NOTE — Addendum Note (Signed)
Addended by: Matilde Sprang on: 04/27/2018 05:02 PM   Modules accepted: Orders

## 2018-04-27 NOTE — Telephone Encounter (Addendum)
Patient is now needing these medications sent to the mail order pharmacy.  Lisinopril, Actos refill Last OV:11/20/17 Last refill: Lisinopril and Actos 02/04/17 90 tab/3 refills UTM:LYYT Pharmacy: [place], [place] to [facility] [phone] (Phone) [phone] (Fax)

## 2018-04-28 ENCOUNTER — Encounter (HOSPITAL_COMMUNITY)
Admission: RE | Admit: 2018-04-28 | Discharge: 2018-04-28 | Disposition: A | Discharge status: Home / Self Care | Payer: Self-pay | Source: Ambulatory Visit | Attending: Cardiology | Admitting: Cardiology

## 2018-04-30 ENCOUNTER — Encounter (HOSPITAL_COMMUNITY): Payer: Self-pay

## 2018-05-01 ENCOUNTER — Encounter (HOSPITAL_COMMUNITY)
Admission: RE | Admit: 2018-05-01 | Discharge: 2018-05-01 | Disposition: A | Discharge status: Home / Self Care | Payer: Self-pay | Source: Ambulatory Visit | Attending: Cardiology | Admitting: Cardiology

## 2018-05-01 MED ORDER — PIOGLITAZONE HCL 30 MG PO TABS: 30.0000 mg | ORAL_TABLET | Freq: Every day | ORAL | 0 refills | Status: DC

## 2018-05-01 MED ORDER — LISINOPRIL 2.5 MG PO TABS: 2.5000 mg | ORAL_TABLET | Freq: Every day | ORAL | 0 refills | Status: DC

## 2018-05-01 NOTE — Addendum Note (Signed)
Addended by: Earnstine Regal on: 05/01/2018 09:34 AM   Modules accepted: Orders

## 2018-05-01 NOTE — Telephone Encounter (Signed)
[facility]Marland KitchenJohny Chess

## 2018-05-02 ENCOUNTER — Other Ambulatory Visit: Payer: Self-pay | Admitting: Internal Medicine

## 2018-05-04 ENCOUNTER — Encounter (HOSPITAL_COMMUNITY)
Admission: RE | Admit: 2018-05-04 | Discharge: 2018-05-04 | Disposition: A | Discharge status: Home / Self Care | Payer: Self-pay | Source: Ambulatory Visit | Attending: Cardiology | Admitting: Cardiology

## 2018-05-05 ENCOUNTER — Encounter (HOSPITAL_COMMUNITY): Payer: Self-pay

## 2018-05-07 ENCOUNTER — Encounter (HOSPITAL_COMMUNITY): Payer: Self-pay

## 2018-05-11 ENCOUNTER — Encounter (HOSPITAL_COMMUNITY): Payer: Self-pay

## 2018-05-12 ENCOUNTER — Encounter (HOSPITAL_COMMUNITY): Payer: Self-pay

## 2018-05-14 ENCOUNTER — Encounter (HOSPITAL_COMMUNITY): Payer: Self-pay

## 2018-05-14 DIAGNOSIS — I2510 Atherosclerotic heart disease of native coronary artery without angina pectoris: Secondary | ICD-10-CM | POA: Insufficient documentation

## 2018-05-14 DIAGNOSIS — E7800 Pure hypercholesterolemia, unspecified: Secondary | ICD-10-CM | POA: Insufficient documentation

## 2018-05-14 DIAGNOSIS — I714 Abdominal aortic aneurysm, without rupture: Secondary | ICD-10-CM | POA: Insufficient documentation

## 2018-05-14 DIAGNOSIS — I10 Essential (primary) hypertension: Secondary | ICD-10-CM | POA: Insufficient documentation

## 2018-05-18 ENCOUNTER — Encounter (HOSPITAL_COMMUNITY): Admission: RE | Admit: 2018-05-18 | Payer: Self-pay | Source: Ambulatory Visit

## 2018-05-19 ENCOUNTER — Encounter (HOSPITAL_COMMUNITY): Payer: Self-pay

## 2018-05-21 ENCOUNTER — Encounter (HOSPITAL_COMMUNITY): Payer: Self-pay

## 2018-05-25 ENCOUNTER — Encounter (HOSPITAL_COMMUNITY): Payer: Self-pay

## 2018-05-26 ENCOUNTER — Encounter (HOSPITAL_COMMUNITY)
Admission: RE | Admit: 2018-05-26 | Discharge: 2018-05-26 | Disposition: A | Discharge status: Home / Self Care | Payer: Self-pay | Source: Ambulatory Visit | Attending: Cardiology | Admitting: Cardiology

## 2018-05-28 ENCOUNTER — Telehealth: Payer: Self-pay

## 2018-05-28 ENCOUNTER — Other Ambulatory Visit (INDEPENDENT_AMBULATORY_CARE_PROVIDER_SITE_OTHER): Payer: Medicare Other

## 2018-05-28 ENCOUNTER — Ambulatory Visit: Payer: Medicare Other | Admitting: Internal Medicine

## 2018-05-28 ENCOUNTER — Encounter (HOSPITAL_COMMUNITY)
Admission: RE | Admit: 2018-05-28 | Discharge: 2018-05-28 | Disposition: A | Discharge status: Home / Self Care | Payer: Self-pay | Source: Ambulatory Visit | Attending: Cardiology | Admitting: Cardiology

## 2018-05-28 ENCOUNTER — Encounter: Payer: Self-pay | Admitting: Internal Medicine

## 2018-05-28 ENCOUNTER — Ambulatory Visit (INDEPENDENT_AMBULATORY_CARE_PROVIDER_SITE_OTHER): Payer: Medicare Other | Admitting: Internal Medicine

## 2018-05-28 VITALS — BP 118/68 | HR 70 | Temp 97.8°F | Ht 73.0 in | Wt 260.0 lb

## 2018-05-28 DIAGNOSIS — E785 Hyperlipidemia, unspecified: Secondary | ICD-10-CM | POA: Diagnosis not present

## 2018-05-28 DIAGNOSIS — Z0000 Encounter for general adult medical examination without abnormal findings: Secondary | ICD-10-CM | POA: Diagnosis not present

## 2018-05-28 DIAGNOSIS — I10 Essential (primary) hypertension: Secondary | ICD-10-CM

## 2018-05-28 DIAGNOSIS — E119 Type 2 diabetes mellitus without complications: Secondary | ICD-10-CM | POA: Diagnosis not present

## 2018-05-28 DIAGNOSIS — I2510 Atherosclerotic heart disease of native coronary artery without angina pectoris: Secondary | ICD-10-CM | POA: Diagnosis not present

## 2018-05-28 DIAGNOSIS — I739 Peripheral vascular disease, unspecified: Secondary | ICD-10-CM | POA: Diagnosis not present

## 2018-05-28 LAB — URINALYSIS, ROUTINE W REFLEX MICROSCOPIC
Bilirubin Urine: NEGATIVE
Hgb urine dipstick: NEGATIVE
Leukocytes, UA: NEGATIVE
Nitrite: NEGATIVE
PH: 5.5 (ref 5.0–8.0)
TOTAL PROTEIN, URINE-UPE24: NEGATIVE
UROBILINOGEN UA: 0.2 (ref 0.0–1.0)
Urine Glucose: NEGATIVE

## 2018-05-28 LAB — HEPATIC FUNCTION PANEL
ALBUMIN: 4.1 g/dL (ref 3.5–5.2)
ALT: 17 U/L (ref 0–53)
AST: 16 U/L (ref 0–37)
Alkaline Phosphatase: 103 U/L (ref 39–117)
Bilirubin, Direct: 0.1 mg/dL (ref 0.0–0.3)
TOTAL PROTEIN: 6.8 g/dL (ref 6.0–8.3)
Total Bilirubin: 0.7 mg/dL (ref 0.2–1.2)

## 2018-05-28 LAB — BASIC METABOLIC PANEL
BUN: 19 mg/dL (ref 6–23)
CHLORIDE: 104 meq/L (ref 96–112)
CO2: 28 meq/L (ref 19–32)
CREATININE: 0.94 mg/dL (ref 0.40–1.50)
Calcium: 9.4 mg/dL (ref 8.4–10.5)
GFR: 83.83 mL/min (ref >60.00)
Glucose, Bld: 139 mg/dL — ABNORMAL HIGH (ref 70–99)
Potassium: 4.5 mEq/L (ref 3.5–5.1)
Sodium: 139 mEq/L (ref 135–145)

## 2018-05-28 LAB — CBC WITH DIFFERENTIAL/PLATELET
BASOS ABS: 0 10*3/uL (ref 0.0–0.1)
Basophils Relative: 0.5 % (ref 0.0–3.0)
Eosinophils Absolute: 0.2 10*3/uL (ref 0.0–0.7)
Eosinophils Relative: 2.4 % (ref 0.0–5.0)
HEMATOCRIT: 42 % (ref 39.0–52.0)
HEMOGLOBIN: 14.1 g/dL (ref 13.0–17.0)
LYMPHS ABS: 1.5 10*3/uL (ref 0.7–4.0)
LYMPHS PCT: 21.6 % (ref 12.0–46.0)
MCHC: 33.5 g/dL (ref 30.0–36.0)
MCV: 87.5 fl (ref 78.0–100.0)
MONOS PCT: 8.8 % (ref 3.0–12.0)
Monocytes Absolute: 0.6 10*3/uL (ref 0.1–1.0)
NEUTROS PCT: 66.7 % (ref 43.0–77.0)
Neutro Abs: 4.7 10*3/uL (ref 1.4–7.7)
Platelets: 139 10*3/uL — ABNORMAL LOW (ref 150.0–400.0)
RBC: 4.8 Mil/uL (ref 4.22–5.81)
RDW: 15 % (ref 11.5–15.5)
WBC: 7 10*3/uL (ref 4.0–10.5)

## 2018-05-28 LAB — LIPID PANEL
CHOL/HDL RATIO: 3
Cholesterol: 90 mg/dL (ref 0–200)
HDL: 31.9 mg/dL — AB (ref >39.00)
LDL Cholesterol: 28 mg/dL (ref 0–99)
NonHDL: 57.86
TRIGLYCERIDES: 151 mg/dL — AB (ref 0.0–149.0)
VLDL: 30.2 mg/dL (ref 0.0–40.0)

## 2018-05-28 LAB — PSA: PSA: 2.35 ng/mL (ref 0.10–4.00)

## 2018-05-28 LAB — TSH: TSH: 0.41 u[IU]/mL (ref 0.35–4.50)

## 2018-05-28 NOTE — Patient Instructions (Signed)
It sounds as if you may have "claudication" (slow blood flow to the leg below the knee)  You will be contacted regarding the referral for: ultrasound test for circulation  Please continue all other medications as before, and refills have been done if requested.  Please have the pharmacy call with any other refills you may need.  Please continue your efforts at being more active, low cholesterol diet, and weight control.  Please keep your appointments with your specialists as you may have planned  Your blood was drawn this AM  You will be contacted by phone if any changes need to be made immediately.  Otherwise, you will receive a letter about your results with an explanation, but please check with MyChart first.  Please remember to sign up for MyChart if you have not done so, as this will be important to you in the future with finding out test results, communicating by private email, and scheduling acute appointments online when needed.  Please return in 6 months, or sooner if needed

## 2018-05-28 NOTE — Telephone Encounter (Signed)
Lab needed CPE orders entered

## 2018-05-28 NOTE — Assessment & Plan Note (Signed)
stable overall by history and exam, recent data reviewed with pt, and pt to continue medical treatment as before,  to f/u any worsening symptoms or concerns, for f/u lab today 

## 2018-05-28 NOTE — Assessment & Plan Note (Signed)
stable overall by history and exam, recent data reviewed with pt, and pt to continue medical treatment as before,  to f/u any worsening symptoms or concerns  

## 2018-05-28 NOTE — Progress Notes (Signed)
Subjective:    Patient ID: Allen Myers, male    DOB: August 01, 1946, 72 y.o.   MRN: [id number]  HPI  Here to f/u, complaining that medicare will not pay for lab work prior to visit so "what do we have to talk about?"  Here to f/u; overall doing ok,  Pt denies chest pain, increasing sob or doe, wheezing, orthopnea, PND, increased LE swelling, palpitations, dizziness or syncope.  Pt denies new neurological symptoms such as new headache, or facial or extremity weakness or numbness.  Pt denies polydipsia, polyuria, or low sugar episode.  Pt states overall good compliance with meds, mostly trying to follow appropriate diet, with wt overall stable,  but little exercise however, in fact in last 2 mo has what sounds like reproducible type right calf discomfort with ambulation, better to rest, then recurs again after further walking about 500 ft.  Has bilat knee pain for which he plans to see Dr Smith/sport med but this seems different.  No trauma, worsening LBP or neuritic type complaints.   Past Medical History:  Diagnosis Date  . Allergy   . Arthritis   . Erectile dysfunction   . History of colon polyps   . History of diverticulitis   . HYPERTENSION   . Hypothyroidism   . Kidney stones    "passed it"  . SAH (subarachnoid hemorrhage) ([place])    Following syncopal episode  . SDH (subdural hematoma) ([facility])    Following syncopal episode  . Seizures ([place])    08/2014 - controlled  . Sleep apnea    "mild"  does not use CPAP  . Sleep apnea in adult   . Type II diabetes mellitus ([place])    type 2   Past Surgical History:  Procedure Laterality Date  . COLONOSCOPY  2014   polyp  . CORONARY ANGIOPLASTY WITH STENT PLACEMENT  05/11/2014   "1"  . CRANIOTOMY Left 09/12/2014   Procedure: CRANIOTOMY HEMATOMA EVACUATION SUBDURAL;  Surgeon: Erline Levine, MD;  Location: [facility];  Service: Neurosurgery;  Laterality: Left;  . FRACTIONAL FLOW RESERVE WIRE  05/11/2014   Procedure: FRACTIONAL FLOW RESERVE WIRE;   Surgeon: [name], MD;  Location[facility] LAB;  Service: Cardiovascular;;  . LEFT HEART CATHETERIZATION WITH CORONARY ANGIOGRAM N/A 05/11/2014   Procedure: LEFT HEART CATHETERIZATION WITH CORONARY ANGIOGRAM;  Surgeon: [name], MD;  Location: [facility] CATH LAB;  Service: Cardiovascular;  Laterality: N/A;  . POLYPECTOMY      reports that he quit smoking about 34 years ago. His smoking use included cigarettes. He has a 30.00 pack-year smoking history. He has never used smokeless tobacco. He reports that he does not drink alcohol or use drugs. family history includes Aplastic anemia in his sister; Ataxia in his maternal grandmother; Dementia in his maternal grandmother; Heart disease in his father and mother; Lung cancer in his cousin and father; Prostate cancer in his brother. No Known Allergies Current Outpatient Medications on File Prior to Visit  Medication Sig Dispense Refill  . Ascorbic Acid (VITAMIN C PO) Take 1 tablet by mouth.    Marland Kitchen aspirin EC 81 MG tablet Take 81 mg by mouth daily.    Marland Kitchen atorvastatin (LIPITOR) 80 MG tablet Take 1 tablet (80 mg total) by mouth daily. 90 tablet 0  . carvedilol (COREG) 3.125 MG tablet TAKE 1 TABLET BY MOUTH TWICE A DAY WITH A MEAL 180 tablet 0  . levETIRAcetam (KEPPRA) 1000 MG tablet Take 1 tablet (1,000  mg total) by mouth 2 (two) times daily. 180 tablet 0  . levothyroxine (SYNTHROID, LEVOTHROID) 150 MCG tablet Take 1 tablet (150 mcg total) by mouth daily. 90 tablet 0  . linagliptin (TRADJENTA) 5 MG TABS tablet Take 1 tablet (5 mg total) by mouth daily. 90 tablet 0  . lisinopril (PRINIVIL,ZESTRIL) 2.5 MG tablet Take 1 tablet (2.5 mg total) by mouth daily. 90 tablet 0  . metFORMIN (GLUCOPHAGE) 1000 MG tablet TAKE 1 TABLET BY MOUTH TWICE A DAY WITH A MEAL 180 tablet 0  . nitroGLYCERIN (NITROSTAT) 0.4 MG SL tablet PLACE 1 TABLET UNDER THE   TONGUE AND ALLOW TO        DISSOLVE EVERY 5 MINUTES ASNEEDED FOR CHEST PAIN 25 tablet 12  . omeprazole (PRILOSEC)  40 MG capsule TAKE 1 CAPSULE DAILY 90 capsule 0  . ONE TOUCH ULTRA TEST test strip USE TO CHECK BLOOD SUGAR TWICE A DAY AS DIRECTED 100 each 0  . pioglitazone (ACTOS) 30 MG tablet Take 1 tablet (30 mg total) by mouth daily. 90 tablet 0  . tiZANidine (ZANAFLEX) 2 MG tablet Take 1 tablet (2 mg total) by mouth at bedtime as needed for muscle spasms. 30 tablet 5   No current facility-administered medications on file prior to visit.    Review of Systems  Constitutional: Negative for other unusual diaphoresis or sweats HENT: Negative for ear discharge or swelling Eyes: Negative for other worsening visual disturbances Respiratory: Negative for stridor or other swelling  Gastrointestinal: Negative for worsening distension or other blood Genitourinary: Negative for retention or other urinary change Musculoskeletal: Negative for other MSK pain or swelling Skin: Negative for color change or other new lesions Neurological: Negative for worsening tremors and other numbness  Psychiatric/Behavioral: Negative for worsening agitation or other fatigue All other system neg per pt     Objective:   Physical Exam BP 118/68   Pulse 70   Temp 97.8 F (36.6 C) (Oral)   Ht 6\' 1"  (1.854 m)   Wt 260 lb (117.9 kg)   SpO2 94%   BMI 34.30 kg/m  VS noted,  Constitutional: Pt appears in NAD HENT: Head: NCAT.  Right Ear: External ear normal.  Left Ear: External ear normal.  Eyes: . Pupils are equal, round, and reactive to light. Conjunctivae and EOM are normal Nose: without d/c or deformity Neck: Neck supple. Gross normal ROM Cardiovascular: Normal rate and regular rhythm.   Pulmonary/Chest: Effort normal and breath sounds without rales or wheezing.  Abd:  Soft, NT, ND, + BS, no organomegaly Neurological: Pt is alert. At baseline orientation, motor grossly intact Skin: Skin is warm. No rashes, other new lesions, no LE edema, trace pulses bilat dorsalis pedis Psychiatric: Pt behavior is normal without  agitation  No other exam findings Lab Results  Component Value Date   WBC 7.5 01/28/2017   HGB 14.6 01/28/2017   HCT 44.1 01/28/2017   PLT 152.0 01/28/2017   GLUCOSE 118 (H) 11/18/2017   CHOL 94 05/16/2017   TRIG 143.0 05/16/2017   HDL 35.40 (L) 05/16/2017   LDLDIRECT 85.7 06/11/2013   LDLCALC 30 05/16/2017   ALT 19 05/16/2017   AST 16 05/16/2017   NA 138 11/18/2017   K 4.5 11/18/2017   CL 103 11/18/2017   CREATININE 0.89 11/18/2017   BUN 15 11/18/2017   CO2 27 11/18/2017   TSH 1.10 05/16/2017   PSA 2.13 01/28/2017   INR 1.07 09/06/2014   HGBA1C 7.5 (H) 11/18/2017   MICROALBUR 1.0  01/28/2017       Assessment & Plan:

## 2018-05-28 NOTE — Assessment & Plan Note (Signed)
May now be experiencing vascular claudicaiton type symptoms, for LE arterial studies

## 2018-05-28 NOTE — Assessment & Plan Note (Signed)
stable overall by history and exam, recent data reviewed with pt, and pt to continue medical treatment as before,  to f/u any worsening symptoms or concerns, f/u lab later today

## 2018-05-29 ENCOUNTER — Other Ambulatory Visit: Payer: Self-pay | Admitting: Internal Medicine

## 2018-05-29 ENCOUNTER — Ambulatory Visit (HOSPITAL_COMMUNITY)
Admission: RE | Admit: 2018-05-29 | Discharge: 2018-05-29 | Disposition: A | Discharge status: Home / Self Care | Payer: Medicare Other | Source: Ambulatory Visit | Attending: Cardiology | Admitting: Cardiology

## 2018-05-29 DIAGNOSIS — I739 Peripheral vascular disease, unspecified: Secondary | ICD-10-CM

## 2018-06-01 ENCOUNTER — Encounter (HOSPITAL_COMMUNITY): Payer: Self-pay

## 2018-06-01 ENCOUNTER — Telehealth: Payer: Self-pay

## 2018-06-01 NOTE — Telephone Encounter (Signed)
Pt has viewed results via MyChart  

## 2018-06-01 NOTE — Telephone Encounter (Signed)
-----   Message from Biagio Borg, MD sent at 05/29/2018  7:37 PM EDT ----- Left message on MyChart, pt to cont same tx except  The test results show that your current treatment is OK, except as we discussed, the test appears to indicate less blood flow to the right leg.  We should refer you to Vascular Surgury, and you should hear soon.    Allen Myers to please inform pt, I will do referral

## 2018-06-02 ENCOUNTER — Encounter (HOSPITAL_COMMUNITY)
Admission: RE | Admit: 2018-06-02 | Discharge: 2018-06-02 | Disposition: A | Discharge status: Home / Self Care | Payer: Self-pay | Source: Ambulatory Visit | Attending: Cardiology | Admitting: Cardiology

## 2018-06-02 ENCOUNTER — Encounter: Payer: Self-pay | Admitting: Internal Medicine

## 2018-06-02 NOTE — Telephone Encounter (Signed)
Staff to assist pt with Nurse Visit for POCT - A1c please

## 2018-06-03 NOTE — Telephone Encounter (Signed)
PCC's to see patient concern

## 2018-06-04 ENCOUNTER — Encounter (HOSPITAL_COMMUNITY)
Admission: RE | Admit: 2018-06-04 | Discharge: 2018-06-04 | Disposition: A | Discharge status: Home / Self Care | Payer: Self-pay | Source: Ambulatory Visit | Attending: Cardiology | Admitting: Cardiology

## 2018-06-05 ENCOUNTER — Encounter (HOSPITAL_COMMUNITY)
Admission: RE | Admit: 2018-06-05 | Discharge: 2018-06-05 | Disposition: A | Discharge status: Home / Self Care | Payer: Self-pay | Source: Ambulatory Visit | Attending: Cardiology | Admitting: Cardiology

## 2018-06-08 ENCOUNTER — Encounter (HOSPITAL_COMMUNITY): Payer: Self-pay

## 2018-06-09 ENCOUNTER — Encounter (HOSPITAL_COMMUNITY): Payer: Self-pay

## 2018-06-11 ENCOUNTER — Encounter (HOSPITAL_COMMUNITY): Payer: Self-pay

## 2018-06-12 ENCOUNTER — Encounter: Payer: Self-pay | Admitting: Internal Medicine

## 2018-06-12 NOTE — Telephone Encounter (Signed)
For some reason the A1c was not done with the other labs, and now is too late to add on to blood already drawn.  Staff to please contact pt consider making a Nurse Visit appt for a fingerstick A1c here in the office.

## 2018-06-14 NOTE — Progress Notes (Signed)
[name] [place], [place] [address] Phone: [phone] Subjective:   [name], am serving as a scribe for Dr. Hulan Saas.   CC: Leg pain  MPN:[id number]  Allen Myers is a 72 y.o. male coming in with complaint of leg pain. He has been having right calf tightness after walking prolonged distances. The pain gets so bad that he cannot take another step due to the pain. He does cardiac rehab.  Has had pain for a few months. Found out that he has claudication. He has trouble with both knees as well, left greater than right. Pain in the right knee with stair climbing over quad tendon. Pain in left knee is over the medial joint line with lateral movements. Patient notes pain with sidelying in the knee on the medial aspect.    Patient was sent recently for Dopplers on August 16.  These were independently visualized by me showing the patient did have significant right-sided arterial claudication noted.    Past Medical History:  Diagnosis Date  . Allergy   . Arthritis   . Erectile dysfunction   . History of colon polyps   . History of diverticulitis   . HYPERTENSION   . Hypothyroidism   . Kidney stones    "passed it"  . SAH (subarachnoid hemorrhage) ([place])    Following syncopal episode  . SDH (subdural hematoma) ([facility])    Following syncopal episode  . Seizures ([place])    08/2014 - controlled  . Sleep apnea    "mild"  does not use CPAP  . Sleep apnea in adult   . Type II diabetes mellitus ([place])    type 2   Past Surgical History:  Procedure Laterality Date  . COLONOSCOPY  2014   polyp  . CORONARY ANGIOPLASTY WITH STENT PLACEMENT  05/11/2014   "1"  . CRANIOTOMY Left 09/12/2014   Procedure: CRANIOTOMY HEMATOMA EVACUATION SUBDURAL;  Surgeon: Erline Levine, MD;  Location: [place] [facility];  Service: Neurosurgery;  Laterality: Left;  . FRACTIONAL FLOW RESERVE WIRE  05/11/2014   Procedure: FRACTIONAL FLOW RESERVE WIRE;  Surgeon: [name], MD;  Location: [facility] CATH LAB;  Service: Cardiovascular;;  . LEFT HEART CATHETERIZATION WITH CORONARY ANGIOGRAM N/A 05/11/2014   Procedure: LEFT HEART CATHETERIZATION WITH CORONARY ANGIOGRAM;  Surgeon: [name], MD;  Location: [facility] CATH LAB;  Service: Cardiovascular;  Laterality: N/A;  . POLYPECTOMY     Social History   Socioeconomic History  . Marital status: Married    Spouse name: Beverlee Nims  . Number of children: 3  . Years of education: 57  . Highest education level: Not on file  Occupational History  . 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 Occupation: [phone] (CELL)    Employer: [employer]  Social Needs  . Financial resource strain: Not on file  . Food insecurity:    Worry: Not on file    Inability: Not on file  . Transportation needs:    Medical: Not on file    Non-medical: Not on file  Tobacco Use  . Smoking status: Former Smoker    Packs/day: 1.00    Years: 30.00    Pack years: 30.00    Types: Cigarettes    Last attempt to quit: 10/15/1983    Years since quitting: 34.6  . Smokeless tobacco: Never Used  Substance and Sexual Activity  .  Alcohol use: No    Alcohol/week: 0.0 standard drinks  . Drug use: No  . Sexual activity: Yes  Lifestyle  . Physical activity:    Days per week: Not on file    Minutes per session: Not on file  . Stress: Not on file  Relationships  . Social connections:    Talks on phone: Not on file    Gets together: Not on file    Attends religious service: Not on file    Active member of club or organization: Not on file    Attends meetings of clubs or organizations: Not on file    Relationship status: Not on file  Other Topics Concern  . Not on file  Social History Narrative   Patient is married with 3 daughters 71, 60, 58 and 2 grandchildren.   Patient is right handed.   Patient has hs education.   Patient drinks decaf, and soda  occasionally.   No Known Allergies Family History  Problem Relation Age of Onset  . Lung cancer Father        dad was a smoker  . Heart disease Father   . Heart disease Mother   . Ataxia Maternal Grandmother   . Dementia Maternal Grandmother   . Aplastic anemia Sister   . Prostate cancer Brother   . Lung cancer Cousin   . Colon cancer Neg Hx   . Rectal cancer Neg Hx   . Stomach cancer Neg Hx   . Colon polyps Neg Hx   . Esophageal cancer Neg Hx     Current Outpatient Medications (Endocrine & Metabolic):  .  levothyroxine (SYNTHROID, LEVOTHROID) 150 MCG tablet, Take 1 tablet (150 mcg total) by mouth daily. Marland Kitchen  linagliptin (TRADJENTA) 5 MG TABS tablet, Take 1 tablet (5 mg total) by mouth daily. .  metFORMIN (GLUCOPHAGE) 1000 MG tablet, TAKE 1 TABLET BY MOUTH TWICE A DAY WITH A MEAL .  pioglitazone (ACTOS) 30 MG tablet, Take 1 tablet (30 mg total) by mouth daily.  Current Outpatient Medications (Cardiovascular):  .  atorvastatin (LIPITOR) 80 MG tablet, Take 1 tablet (80 mg total) by mouth daily. .  carvedilol (COREG) 3.125 MG tablet, TAKE 1 TABLET BY MOUTH TWICE A DAY WITH A MEAL .  lisinopril (PRINIVIL,ZESTRIL) 2.5 MG tablet, Take 1 tablet (2.5 mg total) by mouth daily. .  nitroGLYCERIN (NITROSTAT) 0.4 MG SL tablet, PLACE 1 TABLET UNDER THE   TONGUE AND ALLOW TO        DISSOLVE EVERY 5 MINUTES ASNEEDED FOR CHEST PAIN   Current Outpatient Medications (Analgesics):  .  aspirin EC 81 MG tablet, Take 81 mg by mouth daily.   Current Outpatient Medications (Other):  Marland Kitchen  Ascorbic Acid (VITAMIN C PO), Take 1 tablet by mouth. .  levETIRAcetam (KEPPRA) 1000 MG tablet, Take 1 tablet (1,000 mg total) by mouth 2 (two) times daily. Marland Kitchen  omeprazole (PRILOSEC) 40 MG capsule, TAKE 1 CAPSULE DAILY .  ONE TOUCH ULTRA TEST test strip, USE TO CHECK BLOOD SUGAR TWICE A DAY AS DIRECTED .  tiZANidine (ZANAFLEX) 2 MG tablet, Take 1 tablet (2 mg total) by mouth at bedtime as needed for muscle  spasms.    Past medical history, social, surgical and family history all reviewed in electronic medical record.  No pertanent information unless stated regarding to the chief complaint.   Review of Systems:  No headache, visual changes, nausea, vomiting, diarrhea, constipation, dizziness, abdominal pain, skin rash, fevers, chills, night sweats, weight loss, swollen  lymph nodes,  chest pain, shortness of breath, mood changes.  Positive muscle aching and cramping, mild positive body aches  Objective  Blood pressure 92/68, height 6\' 1"  (1.854 m), weight 259 lb (117.5 kg).    General: No apparent distress alert and oriented x3 mood and affect normal, dressed appropriately.  HEENT: Pupils equal, extraocular movements intact  Respiratory: Patient's speak in full sentences and does not appear short of breath  Cardiovascular: No lower extremity edema, non tender, no erythema  Skin: Warm dry intact with no signs of infection or rash on extremities or on axial skeleton.  Abdomen: Soft nontender  Neuro: Cranial nerves II through XII are intact, neurovascularly intact in all extremities with 2+ DTRs Lymph: No lymphadenopathy of posterior or anterior cervical chain or axillae bilaterally.  Gait normal with good balance and coordination.  MSK:  tender with full range of motion and good stability and symmetric strength and tone of shoulders, elbows, wrist, hip, and ankles bilaterally.  Mild arthritic changes Knee: Bilateral valgus deformity noted. Large thigh to calf ratio.  Tender to palpation over medial and PF joint line.  ROM full in flexion and extension and lower leg rotation. instability with valgus force.  painful patellar compression. Patellar glide with moderate crepitus. Patellar and quadriceps tendons unremarkable. Hamstring and quadriceps strength is normal.   Patient back exam does have loss of lordosis.  Some tightness of the hamstring on the right greater than left but negative  straight leg test.  Mild discomfort with piriformis. Patient's pulses low dorsalis pedis pulse is significantly decreased on the right compared to the contralateral side.   Impression and Recommendations:     This case required medical decision making of moderate complexity. The above documentation has been reviewed and is accurate and complete Lyndal Pulley, DO       Note: This dictation was prepared with Dragon dictation along with smaller phrase technology. Any transcriptional errors that result from this process are unintentional.

## 2018-06-16 ENCOUNTER — Ambulatory Visit (INDEPENDENT_AMBULATORY_CARE_PROVIDER_SITE_OTHER): Payer: Medicare Other | Admitting: Family Medicine

## 2018-06-16 ENCOUNTER — Encounter: Payer: Self-pay | Admitting: Family Medicine

## 2018-06-16 ENCOUNTER — Ambulatory Visit (INDEPENDENT_AMBULATORY_CARE_PROVIDER_SITE_OTHER)
Admission: RE | Admit: 2018-06-16 | Discharge: 2018-06-16 | Disposition: A | Discharge status: Home / Self Care | Payer: Medicare Other | Source: Ambulatory Visit | Attending: Family Medicine | Admitting: Family Medicine

## 2018-06-16 ENCOUNTER — Telehealth: Payer: Self-pay | Admitting: *Deleted

## 2018-06-16 ENCOUNTER — Encounter (HOSPITAL_COMMUNITY)
Admission: RE | Admit: 2018-06-16 | Discharge: 2018-06-16 | Disposition: A | Discharge status: Home / Self Care | Payer: Self-pay | Source: Ambulatory Visit | Attending: Cardiology | Admitting: Cardiology

## 2018-06-16 VITALS — BP 92/68 | Ht 73.0 in | Wt 259.0 lb

## 2018-06-16 DIAGNOSIS — E119 Type 2 diabetes mellitus without complications: Secondary | ICD-10-CM | POA: Diagnosis not present

## 2018-06-16 DIAGNOSIS — Z23 Encounter for immunization: Secondary | ICD-10-CM

## 2018-06-16 DIAGNOSIS — I739 Peripheral vascular disease, unspecified: Secondary | ICD-10-CM | POA: Diagnosis not present

## 2018-06-16 DIAGNOSIS — M5416 Radiculopathy, lumbar region: Secondary | ICD-10-CM

## 2018-06-16 DIAGNOSIS — M25562 Pain in left knee: Secondary | ICD-10-CM

## 2018-06-16 DIAGNOSIS — M25561 Pain in right knee: Secondary | ICD-10-CM

## 2018-06-16 DIAGNOSIS — M170 Bilateral primary osteoarthritis of knee: Secondary | ICD-10-CM | POA: Insufficient documentation

## 2018-06-16 DIAGNOSIS — I2510 Atherosclerotic heart disease of native coronary artery without angina pectoris: Secondary | ICD-10-CM | POA: Insufficient documentation

## 2018-06-16 DIAGNOSIS — M48061 Spinal stenosis, lumbar region without neurogenic claudication: Secondary | ICD-10-CM | POA: Diagnosis not present

## 2018-06-16 DIAGNOSIS — I714 Abdominal aortic aneurysm, without rupture: Secondary | ICD-10-CM | POA: Insufficient documentation

## 2018-06-16 DIAGNOSIS — E7800 Pure hypercholesterolemia, unspecified: Secondary | ICD-10-CM | POA: Insufficient documentation

## 2018-06-16 DIAGNOSIS — I10 Essential (primary) hypertension: Secondary | ICD-10-CM | POA: Insufficient documentation

## 2018-06-16 LAB — POCT GLYCOSYLATED HEMOGLOBIN (HGB A1C): Hemoglobin A1C: 7.1 % — AB (ref 4.0–5.6)

## 2018-06-16 NOTE — Telephone Encounter (Signed)
Pt came in today to see Dr. Tamala Julian he stated Dr. Jenny Reichmann wanted him to come in for a nurse visit to have his A1C check. finger A1C was done results was at 7.1.Marland KitchenJohny Chess

## 2018-06-16 NOTE — Assessment & Plan Note (Signed)
Claudication of the right lower extremity.  Abnormal ABI.  Patient is already following up for the vascular testing.  Encourage patient to discuss with cardiologist.  We discussed the possibility of Pletal which patient wants to hold until patient discussed with his heart doctor.  Has had an abdominal aortic aneurysm as well.  Discussed compression, icing, topical anti-inflammatories given.  Discussed potentially doing more biking instead of walking that could be beneficial as well.  Differential includes a neurogenic claudication and x-rays of the back pending.  Follow-up with me again in 3 to 4 weeks

## 2018-06-16 NOTE — Patient Instructions (Signed)
Good to see you  Allen Myers is your friend. Ice 20 minutes 2 times daily. Usually after activity and before bed. pennsaid pinkie amount topically 2 times daily as needed.  Over the counter get  Vitamin D 2000 IU daily  Tart cherry extract any dose at night Exercises 3 times a week.   xrays downstairs Call the cardiologist to discuss the claudication  See me again in 4 weeks

## 2018-06-16 NOTE — Assessment & Plan Note (Signed)
Bilateral knee arthritis.

## 2018-06-17 ENCOUNTER — Telehealth: Payer: Self-pay

## 2018-06-17 ENCOUNTER — Encounter (HOSPITAL_COMMUNITY)
Admission: RE | Admit: 2018-06-17 | Discharge: 2018-06-17 | Disposition: A | Discharge status: Home / Self Care | Payer: Self-pay | Source: Ambulatory Visit | Attending: Cardiology | Admitting: Cardiology

## 2018-06-17 ENCOUNTER — Other Ambulatory Visit: Payer: Self-pay

## 2018-06-17 ENCOUNTER — Telehealth: Payer: Self-pay | Admitting: Internal Medicine

## 2018-06-17 DIAGNOSIS — I714 Abdominal aortic aneurysm, without rupture, unspecified: Secondary | ICD-10-CM

## 2018-06-17 MED ORDER — PIOGLITAZONE HCL 45 MG PO TABS: 45.0000 mg | ORAL_TABLET | Freq: Every day | ORAL | 3 refills | Status: DC

## 2018-06-17 NOTE — Telephone Encounter (Signed)
Spoke with patient who said that he had an ultrasound performed on May 22nd with Dr. Stanford Breed. Patient declines another ultrasound at this time due to recently having one and being under the care of Dr. Stanford Breed for the aneurysm.

## 2018-06-17 NOTE — Addendum Note (Signed)
Addended by: Biagio Borg on: 06/17/2018 12:49 PM   Modules accepted: Orders

## 2018-06-17 NOTE — Telephone Encounter (Signed)
Notified pt w/MD response.../lmb 

## 2018-06-17 NOTE — Telephone Encounter (Addendum)
This A1c is quite good but could use some slight increase in medication, as the goal is to be less than 7  He is taking the max doses on metformin and tradjenta, but the actos can be increased to 45 mg - I will send rx

## 2018-06-17 NOTE — Telephone Encounter (Signed)
Spoke with patient per xray results.  

## 2018-06-17 NOTE — Telephone Encounter (Signed)
Diane from [facility] called with results of lumbar spine:  IMPRESSION: 1. Abdominal aortic aneurysm. This appears to measure up to 4 cm on present plain film examination (which may overestimate size). Ultrasound or CT recommended for further delineation. 2. Moderate L4-5 disc space narrowing. 3. Mild to moderate L5-S1 disc space narrowing.  Will notify Primary Care at [facility].

## 2018-06-18 ENCOUNTER — Encounter (HOSPITAL_COMMUNITY)
Admission: RE | Admit: 2018-06-18 | Discharge: 2018-06-18 | Disposition: A | Discharge status: Home / Self Care | Payer: Self-pay | Source: Ambulatory Visit | Attending: Cardiology | Admitting: Cardiology

## 2018-06-23 ENCOUNTER — Encounter

## 2018-06-23 ENCOUNTER — Encounter: Payer: Self-pay | Admitting: Vascular Surgery

## 2018-06-23 ENCOUNTER — Other Ambulatory Visit: Payer: Self-pay

## 2018-06-23 ENCOUNTER — Ambulatory Visit (INDEPENDENT_AMBULATORY_CARE_PROVIDER_SITE_OTHER): Payer: Medicare Other | Admitting: Vascular Surgery

## 2018-06-23 VITALS — BP 104/69 | HR 67 | Resp 20 | Ht 73.0 in | Wt 265.2 lb

## 2018-06-23 DIAGNOSIS — I739 Peripheral vascular disease, unspecified: Secondary | ICD-10-CM

## 2018-06-23 MED ORDER — CILOSTAZOL 100 MG PO TABS: 100.0000 mg | ORAL_TABLET | Freq: Two times a day (BID) | ORAL | 11 refills | Status: DC

## 2018-06-23 NOTE — Progress Notes (Addendum)
Patient name: Allen Myers MRN: [id number] DOB: 12-08-1945 Sex: male  REASON FOR CONSULT: Right lower extremity claudication  HPI: Allen Myers is a 72 y.o. male with history of coronary artery disease status post PCI, hypertension, diabetes as well as previous craniotomy that presents for evaluation of right lower extremity claudication.  Patient states he has noticed burning in his right calf when walking. The pain improves when he stops.  He states symptoms have gotten worse over the last 1 to 2 months.  States he can now walk about a quarter of a mile before having to stop.  Most notably having symptoms on treadmill in cardiac rehab.  He denies any previous lower extremity revascularization procedures.  Has never seen a vascular surgeon before.  Denies tobacco abuse and quit smoking in 1980s.  He is on aspirin and statin.  He states he carries no diagnosis of congestive heart failure.  Past Medical History:  Diagnosis Date  . Allergy   . Arthritis   . CAD (coronary artery disease)   . Erectile dysfunction   . History of colon polyps   . History of diverticulitis   . HYPERTENSION   . Hypothyroidism   . Kidney stones    "passed it"  . Peripheral arterial disease ([place])   . SAH (subarachnoid hemorrhage) ([place])    Following syncopal episode  . SDH (subdural hematoma) ([facility])    Following syncopal episode  . Seizures ([place])    08/2014 - controlled  . Sleep apnea    "mild"  does not use CPAP  . Sleep apnea in adult   . Type II diabetes mellitus ([place])    type 2    Past Surgical History:  Procedure Laterality Date  . COLONOSCOPY  2014   polyp  . CORONARY ANGIOPLASTY WITH STENT PLACEMENT  05/11/2014   "1"  . CRANIOTOMY Left 09/12/2014   Procedure: CRANIOTOMY HEMATOMA EVACUATION SUBDURAL;  Surgeon: Erline Levine, MD;  Location: [facility];  Service: Neurosurgery;  Laterality: Left;  . FRACTIONAL FLOW RESERVE WIRE  05/11/2014   Procedure: FRACTIONAL FLOW RESERVE WIRE;  Surgeon:  [name], MD;  Location: [facility] CATH LAB;  Service: Cardiovascular;;  . LEFT HEART CATHETERIZATION WITH CORONARY ANGIOGRAM N/A 05/11/2014   Procedure: LEFT HEART CATHETERIZATION WITH CORONARY ANGIOGRAM;  Surgeon: [name], MD;  Location: [facility] CATH LAB;  Service: Cardiovascular;  Laterality: N/A;  . POLYPECTOMY      Family History  Problem Relation Age of Onset  . Lung cancer Father        dad was a smoker  . Heart disease Father   . AAA (abdominal aortic aneurysm) Father   . Heart disease Mother   . Ataxia Maternal Grandmother   . Dementia Maternal Grandmother   . Aplastic anemia Sister   . Prostate cancer Brother   . Lung cancer Cousin   . Colon cancer Neg Hx   . Rectal cancer Neg Hx   . Stomach cancer Neg Hx   . Colon polyps Neg Hx   . Esophageal cancer Neg Hx     SOCIAL HISTORY: Social History   Socioeconomic History  . Marital status: Married    Spouse name: Beverlee Nims  . Number of children: 3  . Years of education: 54  . Highest education level: Not on file  Occupational History  . 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 Occupation:  [phone] (CELL)    Employer: [employer]  Social Needs  . Financial resource strain: Not on file  . Food insecurity:    Worry: Not on file    Inability: Not on file  . Transportation needs:    Medical: Not on file    Non-medical: Not on file  Tobacco Use  . Smoking status: Former Smoker    Packs/day: 1.00    Years: 30.00    Pack years: 30.00    Types: Cigarettes    Last attempt to quit: 10/15/1983    Years since quitting: 34.7  . Smokeless tobacco: Never Used  Substance and Sexual Activity  . Alcohol use: No    Alcohol/week: 0.0 standard drinks  . Drug use: No  . Sexual activity: Yes  Lifestyle  . Physical activity:    Days per week: Not on file    Minutes per session: Not on file  . Stress: Not on file  Relationships  .  Social connections:    Talks on phone: Not on file    Gets together: Not on file    Attends religious service: Not on file    Active member of club or organization: Not on file    Attends meetings of clubs or organizations: Not on file    Relationship status: Not on file  . Intimate partner violence:    Fear of current or ex partner: Not on file    Emotionally abused: Not on file    Physically abused: Not on file    Forced sexual activity: Not on file  Other Topics Concern  . Not on file  Social History Narrative   Patient is married with 3 daughters 78, 92, 26 and 2 grandchildren.   Patient is right handed.   Patient has hs education.   Patient drinks decaf, and soda occasionally.    No Known Allergies  Current Outpatient Medications  Medication Sig Dispense Refill  . Ascorbic Acid (VITAMIN C PO) Take 1 tablet by mouth.    Marland Kitchen aspirin EC 81 MG tablet Take 81 mg by mouth daily.    Marland Kitchen atorvastatin (LIPITOR) 80 MG tablet Take 1 tablet (80 mg total) by mouth daily. 90 tablet 0  . carvedilol (COREG) 3.125 MG tablet TAKE 1 TABLET BY MOUTH TWICE A DAY WITH A MEAL 180 tablet 0  . levETIRAcetam (KEPPRA) 1000 MG tablet Take 1 tablet (1,000 mg total) by mouth 2 (two) times daily. 180 tablet 0  . levothyroxine (SYNTHROID, LEVOTHROID) 150 MCG tablet Take 1 tablet (150 mcg total) by mouth daily. 90 tablet 0  . linagliptin (TRADJENTA) 5 MG TABS tablet Take 1 tablet (5 mg total) by mouth daily. 90 tablet 0  . lisinopril (PRINIVIL,ZESTRIL) 2.5 MG tablet Take 1 tablet (2.5 mg total) by mouth daily. 90 tablet 0  . metFORMIN (GLUCOPHAGE) 1000 MG tablet TAKE 1 TABLET BY MOUTH TWICE A DAY WITH A MEAL 180 tablet 0  . nitroGLYCERIN (NITROSTAT) 0.4 MG SL tablet PLACE 1 TABLET UNDER THE   TONGUE AND ALLOW TO        DISSOLVE EVERY 5 MINUTES ASNEEDED FOR CHEST PAIN 25 tablet 12  . omeprazole (PRILOSEC) 40 MG capsule TAKE 1 CAPSULE DAILY 90 capsule 0  . ONE TOUCH ULTRA TEST test strip USE TO CHECK BLOOD SUGAR  TWICE A DAY AS DIRECTED 100 each 0  . pioglitazone (ACTOS) 45 MG tablet Take 1 tablet (45 mg total) by mouth daily. 90 tablet 3  . tiZANidine (ZANAFLEX) 2  MG tablet Take 1 tablet (2 mg total) by mouth at bedtime as needed for muscle spasms. 30 tablet 5   No current facility-administered medications for this visit.     REVIEW OF SYSTEMS:  [X]  denotes positive finding, [ ]  denotes negative finding Cardiac  Comments:  Chest pain or chest pressure:    Shortness of breath upon exertion:    Short of breath when lying flat:    Irregular heart rhythm:        Vascular    Pain in calf, thigh, or hip brought on by ambulation: x   Pain in feet at night that wakes you up from your sleep:     Blood clot in your veins:    Leg swelling:         Pulmonary    Oxygen at home:    Productive cough:     Wheezing:         Neurologic    Sudden weakness in arms or legs:     Sudden numbness in arms or legs:     Sudden onset of difficulty speaking or slurred speech:    Temporary loss of vision in one eye:     Problems with dizziness:         Gastrointestinal    Blood in stool:     Vomited blood:         Genitourinary    Burning when urinating:     Blood in urine:        Psychiatric    Major depression:         Hematologic    Bleeding problems:    Problems with blood clotting too easily:        Skin    Rashes or ulcers:        Constitutional    Fever or chills:      PHYSICAL EXAM: Vitals:   06/23/18 1223  BP: 104/69  Pulse: 67  Resp: 20  SpO2: 97%  Weight: 120.3 kg  Height: 6\' 1"  (1.854 m)    GENERAL: The patient is a well-nourished male, in no acute distress. The vital signs are documented above. CARDIAC: There is a regular rate and rhythm.  VASCULAR:  2+ radial pulse palpable bilateral upper extremity. 2+ femoral pulse palpable bilateral groin. No palpable right pedal pulse. 2+ palpable left dorsalis pedis pulse. No tissue loss to either lower extremity.  No foot  staining.  No dependent rubor. PULMONARY: There is good air exchange bilaterally without wheezing or rales. ABDOMEN: Soft and non-tender with normal pitched bowel sounds.  MUSCULOSKELETAL: There are no major deformities or cyanosis. NEUROLOGIC: No focal weakness or paresthesias are detected. SKIN: There are no ulcers or rashes noted. PSYCHIATRIC: The patient has a normal affect.  DATA:   I independently reviewed his noninvasive imaging which shows an ABI of 0.73 in the right lower extremity with monophasic waveform versus an ABI 1.18 in the left lower extremity with a triphasic waveform.  On the right which is his affected side he has a triphasic common femoral artery waveform and a monophasic popliteal waveform.  Assessment/Plan:  72 year old male with right lower extremity claudication.  I have recommended medical therapy at this time.  He is already on an aspirin and a statin.  I prescribed him 100 mg of Pletal twice daily to see if this can improve his walking distance.  I will reach out to his cardiologist Dr. Stanford Breed to ensure that he is okay with this.  Patient does have a history of cardiomyopathy but his last EF was 55 to 60%.  Also discussed walking exercise therapy which should include walking until his calf hurts and then pushing his walking distance even further.  I think we should make every attempt to maximize medical therapy before deciding on any surgical intervention/endovascular therapy.    Marty Heck, MD [facility] Office: [phone] Pager: [place]

## 2018-06-25 ENCOUNTER — Encounter (HOSPITAL_COMMUNITY)
Admission: RE | Admit: 2018-06-25 | Discharge: 2018-06-25 | Disposition: A | Discharge status: Home / Self Care | Payer: Self-pay | Source: Ambulatory Visit | Attending: Cardiology | Admitting: Cardiology

## 2018-06-26 ENCOUNTER — Encounter (HOSPITAL_COMMUNITY)
Admission: RE | Admit: 2018-06-26 | Discharge: 2018-06-26 | Disposition: A | Discharge status: Home / Self Care | Payer: Self-pay | Source: Ambulatory Visit | Attending: Cardiology | Admitting: Cardiology

## 2018-06-30 ENCOUNTER — Encounter (HOSPITAL_COMMUNITY)
Admission: RE | Admit: 2018-06-30 | Discharge: 2018-06-30 | Disposition: A | Discharge status: Home / Self Care | Payer: Self-pay | Source: Ambulatory Visit | Attending: Cardiology | Admitting: Cardiology

## 2018-07-02 ENCOUNTER — Encounter: Payer: Self-pay | Admitting: Internal Medicine

## 2018-07-02 ENCOUNTER — Encounter (HOSPITAL_COMMUNITY): Payer: Self-pay

## 2018-07-03 ENCOUNTER — Encounter (HOSPITAL_COMMUNITY)
Admission: RE | Admit: 2018-07-03 | Discharge: 2018-07-03 | Disposition: A | Discharge status: Home / Self Care | Payer: Self-pay | Source: Ambulatory Visit | Attending: Cardiology | Admitting: Cardiology

## 2018-07-06 ENCOUNTER — Encounter (HOSPITAL_COMMUNITY)
Admission: RE | Admit: 2018-07-06 | Discharge: 2018-07-06 | Disposition: A | Discharge status: Home / Self Care | Payer: Self-pay | Source: Ambulatory Visit | Attending: Interventional Cardiology | Admitting: Interventional Cardiology

## 2018-07-07 ENCOUNTER — Encounter (HOSPITAL_COMMUNITY)
Admission: RE | Admit: 2018-07-07 | Discharge: 2018-07-07 | Disposition: A | Discharge status: Home / Self Care | Payer: Self-pay | Source: Ambulatory Visit | Attending: Cardiology | Admitting: Cardiology

## 2018-07-09 ENCOUNTER — Encounter (HOSPITAL_COMMUNITY): Payer: Self-pay

## 2018-07-10 ENCOUNTER — Encounter (HOSPITAL_COMMUNITY)
Admission: RE | Admit: 2018-07-10 | Discharge: 2018-07-10 | Disposition: A | Discharge status: Home / Self Care | Payer: Self-pay | Source: Ambulatory Visit | Attending: Cardiology | Admitting: Cardiology

## 2018-07-13 ENCOUNTER — Encounter (HOSPITAL_COMMUNITY)
Admission: RE | Admit: 2018-07-13 | Discharge: 2018-07-13 | Disposition: A | Discharge status: Home / Self Care | Payer: Self-pay | Source: Ambulatory Visit | Attending: Cardiology | Admitting: Cardiology

## 2018-07-14 ENCOUNTER — Encounter (HOSPITAL_COMMUNITY)
Admission: RE | Admit: 2018-07-14 | Discharge: 2018-07-14 | Disposition: A | Discharge status: Home / Self Care | Payer: Self-pay | Source: Ambulatory Visit | Attending: Cardiology | Admitting: Cardiology

## 2018-07-14 DIAGNOSIS — I714 Abdominal aortic aneurysm, without rupture: Secondary | ICD-10-CM | POA: Insufficient documentation

## 2018-07-14 DIAGNOSIS — E7800 Pure hypercholesterolemia, unspecified: Secondary | ICD-10-CM | POA: Insufficient documentation

## 2018-07-14 DIAGNOSIS — I10 Essential (primary) hypertension: Secondary | ICD-10-CM | POA: Insufficient documentation

## 2018-07-14 DIAGNOSIS — I2510 Atherosclerotic heart disease of native coronary artery without angina pectoris: Secondary | ICD-10-CM | POA: Insufficient documentation

## 2018-07-16 ENCOUNTER — Encounter (HOSPITAL_COMMUNITY)
Admission: RE | Admit: 2018-07-16 | Discharge: 2018-07-16 | Disposition: A | Discharge status: Home / Self Care | Payer: Self-pay | Source: Ambulatory Visit | Attending: Cardiology | Admitting: Cardiology

## 2018-07-16 DIAGNOSIS — L570 Actinic keratosis: Secondary | ICD-10-CM | POA: Diagnosis not present

## 2018-07-16 DIAGNOSIS — L218 Other seborrheic dermatitis: Secondary | ICD-10-CM | POA: Diagnosis not present

## 2018-07-16 DIAGNOSIS — D485 Neoplasm of uncertain behavior of skin: Secondary | ICD-10-CM | POA: Diagnosis not present

## 2018-07-16 DIAGNOSIS — C44329 Squamous cell carcinoma of skin of other parts of face: Secondary | ICD-10-CM | POA: Diagnosis not present

## 2018-07-17 ENCOUNTER — Encounter (HOSPITAL_COMMUNITY): Payer: Self-pay

## 2018-07-18 NOTE — Progress Notes (Signed)
[name] [place], [place] [address] Phone: [phone] Subjective:    I [name] am serving as a Education administrator for Dr. Hulan Saas.   CC: Back pain  WGN:[id number]  CAULIN BEGLEY is a 72 y.o. male coming in with complaint of back pain. States that he's feeling pretty good today.  Patient was having back pain and leg pain.  Seems to be more vascular.  Has responded very well to Pletal.  Increase to 100 mg twice a day.  Making progress.  Patient feels like he can walk now 400 yards or 500 yards sometimes without any significant discomfort.  More of a discomfort when it does occur and not as much pain.  Feels that with him increase in activity her back pain is improved      Past Medical History:  Diagnosis Date  . Allergy   . Arthritis   . CAD (coronary artery disease)   . Erectile dysfunction   . History of colon polyps   . History of diverticulitis   . HYPERTENSION   . Hypothyroidism   . Kidney stones    "passed it"  . Peripheral arterial disease ([place])   . SAH (subarachnoid hemorrhage) ([place])    Following syncopal episode  . SDH (subdural hematoma) ([facility])    Following syncopal episode  . Seizures ([place])    08/2014 - controlled  . Sleep apnea    "mild"  does not use CPAP  . Sleep apnea in adult   . Type II diabetes mellitus ([place])    type 2   Past Surgical History:  Procedure Laterality Date  . COLONOSCOPY  2014   polyp  . CORONARY ANGIOPLASTY WITH STENT PLACEMENT  05/11/2014   "1"  . CRANIOTOMY Left 09/12/2014   Procedure: CRANIOTOMY HEMATOMA EVACUATION SUBDURAL;  Surgeon: Erline Levine, MD;  Location: [facility];  Service: Neurosurgery;  Laterality: Left;  . FRACTIONAL FLOW RESERVE WIRE  05/11/2014   Procedure: FRACTIONAL FLOW RESERVE WIRE;  Surgeon: [name], MD;  Location: [facility] CATH LAB;  Service: Cardiovascular;;  . LEFT HEART CATHETERIZATION WITH CORONARY ANGIOGRAM N/A 05/11/2014   Procedure: LEFT HEART  CATHETERIZATION WITH CORONARY ANGIOGRAM;  Surgeon: [name], MD;  Location: [facility] CATH LAB;  Service: Cardiovascular;  Laterality: N/A;  . POLYPECTOMY     Social History   Socioeconomic History  . Marital status: Married    Spouse name: Beverlee Nims  . Number of children: 3  . Years of education: 32  . Highest education level: Not on file  Occupational History  . Occupation: [phone] (CELL)    Employer: [employer]  . 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 Occupation: [phone] (CELL)    Employer: [employer]  Social Needs  . Financial resource strain: Not on file  . Food insecurity:    Worry: Not on file    Inability: Not on file  . Transportation needs:    Medical: Not on file    Non-medical: Not on file  Tobacco Use  . Smoking status: Former Smoker    Packs/day: 1.00    Years: 30.00    Pack years: 30.00    Types: Cigarettes    Last attempt to quit: 10/15/1983    Years since quitting: 34.7  . Smokeless tobacco: Never Used  Substance and Sexual Activity  . Alcohol use: No    Alcohol/week: 0.0 standard drinks  . Drug use: No  . Sexual  activity: Yes  Lifestyle  . Physical activity:    Days per week: Not on file    Minutes per session: Not on file  . Stress: Not on file  Relationships  . Social connections:    Talks on phone: Not on file    Gets together: Not on file    Attends religious service: Not on file    Active member of club or organization: Not on file    Attends meetings of clubs or organizations: Not on file    Relationship status: Not on file  Other Topics Concern  . Not on file  Social History Narrative   Patient is married with 3 daughters 30, 24, 85 and 2 grandchildren.   Patient is right handed.   Patient has hs education.   Patient drinks decaf, and soda occasionally.   No Known Allergies Family History  Problem Relation Age of Onset  . Lung cancer Father        dad was a smoker  . Heart disease Father   .  AAA (abdominal aortic aneurysm) Father   . Heart disease Mother   . Ataxia Maternal Grandmother   . Dementia Maternal Grandmother   . Aplastic anemia Sister   . Prostate cancer Brother   . Lung cancer Cousin   . Colon cancer Neg Hx   . Rectal cancer Neg Hx   . Stomach cancer Neg Hx   . Colon polyps Neg Hx   . Esophageal cancer Neg Hx     Current Outpatient Medications (Endocrine & Metabolic):  .  levothyroxine (SYNTHROID, LEVOTHROID) 150 MCG tablet, Take 1 tablet (150 mcg total) by mouth daily. Marland Kitchen  linagliptin (TRADJENTA) 5 MG TABS tablet, Take 1 tablet (5 mg total) by mouth daily. .  metFORMIN (GLUCOPHAGE) 1000 MG tablet, TAKE 1 TABLET BY MOUTH TWICE A DAY WITH A MEAL .  pioglitazone (ACTOS) 45 MG tablet, Take 1 tablet (45 mg total) by mouth daily.  Current Outpatient Medications (Cardiovascular):  .  atorvastatin (LIPITOR) 80 MG tablet, Take 1 tablet (80 mg total) by mouth daily. .  carvedilol (COREG) 3.125 MG tablet, TAKE 1 TABLET BY MOUTH TWICE A DAY WITH A MEAL .  lisinopril (PRINIVIL,ZESTRIL) 2.5 MG tablet, Take 1 tablet (2.5 mg total) by mouth daily. .  nitroGLYCERIN (NITROSTAT) 0.4 MG SL tablet, PLACE 1 TABLET UNDER THE   TONGUE AND ALLOW TO        DISSOLVE EVERY 5 MINUTES ASNEEDED FOR CHEST PAIN   Current Outpatient Medications (Analgesics):  .  aspirin EC 81 MG tablet, Take 81 mg by mouth daily.  Current Outpatient Medications (Hematological):  .  cilostazol (PLETAL) 100 MG tablet, Take 1 tablet (100 mg total) by mouth 2 (two) times daily.  Current Outpatient Medications (Other):  Marland Kitchen  Ascorbic Acid (VITAMIN C PO), Take 1 tablet by mouth. .  levETIRAcetam (KEPPRA) 1000 MG tablet, Take 1 tablet (1,000 mg total) by mouth 2 (two) times daily. Marland Kitchen  omeprazole (PRILOSEC) 40 MG capsule, TAKE 1 CAPSULE DAILY .  ONE TOUCH ULTRA TEST test strip, USE TO CHECK BLOOD SUGAR TWICE A DAY AS DIRECTED .  tiZANidine (ZANAFLEX) 2 MG tablet, Take 1 tablet (2 mg total) by mouth at bedtime as  needed for muscle spasms.    Past medical history, social, surgical and family history all reviewed in electronic medical record.  No pertanent information unless stated regarding to the chief complaint.   Review of Systems:  No headache, visual changes, nausea, vomiting,  diarrhea, constipation, dizziness, abdominal pain, skin rash, fevers, chills, night sweats, weight loss, swollen lymph nodes, body aches, joint swelling, muscle aches, chest pain, shortness of breath, mood changes.   Objective  Blood pressure (!) 96/50, pulse (!) 104, height 6\' 1"  (1.854 m), weight 264 lb (119.7 kg), SpO2 92 %.   General: No apparent distress alert and oriented x3 mood and affect normal, dressed appropriately.  HEENT: Pupils equal, extraocular movements intact  Respiratory: Patient's speak in full sentences and does not appear short of breath  Cardiovascular: Trace lower extremity edema, non tender, no erythema  Skin: Warm dry intact with no signs of infection or rash on extremities or on axial skeleton.  Abdomen: Soft nontender  Neuro: Cranial nerves II through XII are intact, neurovascularly intact in all extremities with 2+ DTRs and 2+ pulses.  Lymph: No lymphadenopathy of posterior or anterior cervical chain or axillae bilaterally.  Gait mild antalgic MSK: Mild tender with full range of motion and good stability and symmetric strength and tone of shoulders, elbows, wrist, hip, and ankles bilaterally.  Bilateral lower extremity does have some mild soreness to palpation.  Patient's knee still show arthritic changes with valgus deformity.     Impression and Recommendations:      The above documentation has been reviewed and is accurate and complete Lyndal Pulley, DO       Note: This dictation was prepared with Dragon dictation along with smaller phrase technology. Any transcriptional errors that result from this process are unintentional.

## 2018-07-20 ENCOUNTER — Encounter (HOSPITAL_COMMUNITY)
Admission: RE | Admit: 2018-07-20 | Discharge: 2018-07-20 | Disposition: A | Discharge status: Home / Self Care | Payer: Self-pay | Source: Ambulatory Visit | Attending: Cardiology | Admitting: Cardiology

## 2018-07-20 ENCOUNTER — Encounter: Payer: Self-pay | Admitting: Family Medicine

## 2018-07-20 ENCOUNTER — Ambulatory Visit (INDEPENDENT_AMBULATORY_CARE_PROVIDER_SITE_OTHER): Payer: Medicare Other | Admitting: Family Medicine

## 2018-07-20 DIAGNOSIS — I739 Peripheral vascular disease, unspecified: Secondary | ICD-10-CM

## 2018-07-20 DIAGNOSIS — I2510 Atherosclerotic heart disease of native coronary artery without angina pectoris: Secondary | ICD-10-CM

## 2018-07-20 DIAGNOSIS — M170 Bilateral primary osteoarthritis of knee: Secondary | ICD-10-CM

## 2018-07-20 NOTE — Assessment & Plan Note (Signed)
Responded very well to the oral medications.  Discussed icing regimen and home exercise.  Discussed which activities of doing which wants to avoid.  Increase activity slowly over the course of next several days.  As long as doing well can follow-up as needed

## 2018-07-20 NOTE — Patient Instructions (Signed)
Good to see you  pennsaid pinkie amount topically 2 times daily as needed.  Ice 20 minutes 2 times daily. Usually after activity and before bed. Stay active Should still get a little better See me again in 3 months or as needed

## 2018-07-20 NOTE — Assessment & Plan Note (Signed)
If worsening will consider injections.  Otherwise follow-up as needed

## 2018-07-21 ENCOUNTER — Encounter (HOSPITAL_COMMUNITY)
Admission: RE | Admit: 2018-07-21 | Discharge: 2018-07-21 | Disposition: A | Discharge status: Home / Self Care | Payer: Self-pay | Source: Ambulatory Visit | Attending: Cardiology | Admitting: Cardiology

## 2018-07-23 ENCOUNTER — Encounter (HOSPITAL_COMMUNITY)
Admission: RE | Admit: 2018-07-23 | Discharge: 2018-07-23 | Disposition: A | Discharge status: Home / Self Care | Payer: Self-pay | Source: Ambulatory Visit | Attending: Cardiology | Admitting: Cardiology

## 2018-07-24 ENCOUNTER — Encounter (HOSPITAL_COMMUNITY): Payer: Self-pay

## 2018-07-27 ENCOUNTER — Encounter (HOSPITAL_COMMUNITY)
Admission: RE | Admit: 2018-07-27 | Discharge: 2018-07-27 | Disposition: A | Discharge status: Home / Self Care | Payer: Self-pay | Source: Ambulatory Visit | Attending: Cardiology | Admitting: Cardiology

## 2018-07-28 ENCOUNTER — Encounter (HOSPITAL_COMMUNITY)
Admission: RE | Admit: 2018-07-28 | Discharge: 2018-07-28 | Disposition: A | Discharge status: Home / Self Care | Payer: Self-pay | Source: Ambulatory Visit | Attending: Cardiology | Admitting: Cardiology

## 2018-07-30 ENCOUNTER — Encounter (HOSPITAL_COMMUNITY)
Admission: RE | Admit: 2018-07-30 | Discharge: 2018-07-30 | Disposition: A | Discharge status: Home / Self Care | Payer: Self-pay | Source: Ambulatory Visit | Attending: Cardiology | Admitting: Cardiology

## 2018-07-31 ENCOUNTER — Encounter (HOSPITAL_COMMUNITY): Payer: Self-pay

## 2018-08-03 ENCOUNTER — Encounter (HOSPITAL_COMMUNITY)
Admission: RE | Admit: 2018-08-03 | Discharge: 2018-08-03 | Disposition: A | Discharge status: Home / Self Care | Payer: Self-pay | Source: Ambulatory Visit | Attending: Cardiology | Admitting: Cardiology

## 2018-08-03 ENCOUNTER — Other Ambulatory Visit: Payer: Self-pay | Admitting: Internal Medicine

## 2018-08-03 NOTE — Telephone Encounter (Signed)
Copied from [place] [id number]. Topic: Quick Communication - Rx Refill/Question >> Aug 03, 2018 11:00 AM Alfredia Ferguson R wrote: Medication: atorvastatin (LIPITOR) 80 MG tablet ,  carvedilol (COREG) 3.125 MG tablet , levothyroxine (SYNTHROID, LEVOTHROID) 150 MCG tablet , linagliptin (TRADJENTA) 5 MG TABS tablet ,  lisinopril (PRINIVIL,ZESTRIL) 2.5 MG tablet   metFORMIN (GLUCOPHAGE) 1000 MG tablet , omeprazole (PRILOSEC) 40 MG capsule , pioglitazone (ACTOS) 45 MG tablet     Has the patient contacted their pharmacy? Yes  Preferred Pharmacy (with phone number or street name): [facility], [place] [facility] [phone] (Phone) [phone] (Fax)   Agent: Please be advised that RX refills may take up to 3 business days. We ask that you follow-up with your pharmacy.

## 2018-08-04 ENCOUNTER — Encounter (HOSPITAL_COMMUNITY)
Admission: RE | Admit: 2018-08-04 | Discharge: 2018-08-04 | Disposition: A | Discharge status: Home / Self Care | Payer: Self-pay | Source: Ambulatory Visit | Attending: Cardiology | Admitting: Cardiology

## 2018-08-04 MED ORDER — OMEPRAZOLE 40 MG PO CPDR: 40.0000 mg | DELAYED_RELEASE_CAPSULE | Freq: Every day | ORAL | 0 refills | Status: DC

## 2018-08-04 MED ORDER — ATORVASTATIN CALCIUM 80 MG PO TABS: 80.0000 mg | ORAL_TABLET | Freq: Every day | ORAL | 0 refills | Status: DC

## 2018-08-04 MED ORDER — CARVEDILOL 3.125 MG PO TABS: ORAL_TABLET | ORAL | 0 refills | Status: DC

## 2018-08-04 MED ORDER — PIOGLITAZONE HCL 45 MG PO TABS: 45.0000 mg | ORAL_TABLET | Freq: Every day | ORAL | 3 refills | Status: DC

## 2018-08-04 MED ORDER — LEVOTHYROXINE SODIUM 150 MCG PO TABS: 150.0000 ug | ORAL_TABLET | Freq: Every day | ORAL | 0 refills | Status: DC

## 2018-08-04 MED ORDER — LINAGLIPTIN 5 MG PO TABS: 5.0000 mg | ORAL_TABLET | Freq: Every day | ORAL | 0 refills | Status: DC

## 2018-08-04 MED ORDER — METFORMIN HCL 1000 MG PO TABS: ORAL_TABLET | ORAL | 0 refills | Status: DC

## 2018-08-04 NOTE — Telephone Encounter (Signed)
Requested Prescriptions  Pending Prescriptions Disp Refills  . atorvastatin (LIPITOR) 80 MG tablet 90 tablet 0    Sig: Take 1 tablet (80 mg total) by mouth daily.     Cardiovascular:  Antilipid - Statins Failed - 08/03/2018  1:52 PM      Failed - HDL in normal range and within 360 days    HDL  Date Value Ref Range Status  05/28/2018 31.90 (L) >39.00 mg/dL Final         Failed - Triglycerides in normal range and within 360 days    Triglycerides  Date Value Ref Range Status  05/28/2018 151.0 (H) 0.0 - 149.0 mg/dL Final    Comment:    Normal:  <150 mg/dLBorderline High:  150 - 199 mg/dL         Passed - Total Cholesterol in normal range and within 360 days    Cholesterol  Date Value Ref Range Status  05/28/2018 90 0 - 200 mg/dL Final    Comment:    ATP III Classification       Desirable:  < 200 mg/dL               Borderline High:  200 - 239 mg/dL          High:  > = 240 mg/dL         Passed - LDL in normal range and within 360 days    LDL Cholesterol  Date Value Ref Range Status  05/28/2018 28 0 - 99 mg/dL Final         Passed - Patient is not pregnant      Passed - Valid encounter within last 12 months    Recent Outpatient Visits          2 weeks ago Claudication [facility])   [place], [place], DO   1 month ago Lumbar radiculopathy   [place], [place], DO   2 months ago Claudication [facility])   [place] Macky, James W, MD   6 months ago Cough   [place], Thomas L, MD   8 months ago Diabetes mellitus type 2, noninsulin dependent [facility])   [facility] -Georges Mouse, MD      Future Appointments            In 2 months Lyndal Pulley, [place], [place]   In 4 months Biagio Borg, MD [facility]         . carvedilol (COREG) 3.125 MG tablet 180 tablet 0     Sig: TAKE 1 TABLET BY MOUTH TWICE A DAY WITH A MEAL     Cardiovascular:  Beta Blockers Passed - 08/03/2018  1:52 PM      Passed - Last BP in normal range    BP Readings from Last 1 Encounters:  07/20/18 (!) 96/50         Passed - Last Heart Rate in normal range    Pulse Readings from Last 1 Encounters:  07/20/18 (!) 104         Passed - Valid encounter within last 6 months    Recent Outpatient Visits          2 weeks ago Claudication [facility])   [place], [place], DO   1 month ago Lumbar radiculopathy   [place], [place], DO   2 months ago Claudication [facility])   [place] Nagi, James W, MD   6 months ago Cough   [place], MD   8 months ago Diabetes mellitus type 2, noninsulin dependent [facility])   [facility] -Georges Mouse, MD      Future Appointments            In 2 months Lyndal Pulley, DO [place], [place]   In 4 months Biagio Borg, MD [place]         . levothyroxine (SYNTHROID, LEVOTHROID) 150 MCG tablet 90 tablet 0    Sig: Take 1 tablet (150 mcg total) by mouth daily.     Endocrinology:  Hypothyroid Agents Failed - 08/03/2018  1:52 PM      Failed - TSH needs to be rechecked within 3 months after an abnormal result. Refill until TSH is due.      Passed - TSH in normal range and within 360 days    TSH  Date Value Ref Range Status  05/28/2018 0.41 0.35 - 4.50 uIU/mL Final         Passed - Valid encounter within last 12 months    Recent Outpatient Visits          2 weeks ago Claudication [facility])   [place], [place], DO   1 month ago Lumbar radiculopathy   [place], [place], DO   2 months ago Claudication [facility])   [place] Mika,  James W, MD   6 months ago Cough   [place], Thomas L, MD   8 months ago Diabetes mellitus type 2, noninsulin dependent [facility])   [facility] -Georges Mouse, MD      Future Appointments            In 2 months Lyndal Pulley, [place], [place]   In 4 months Biagio Borg, MD [facility]         . linagliptin (TRADJENTA) 5 MG TABS tablet 90 tablet 0    Sig: Take 1 tablet (5 mg total) by mouth daily.     Endocrinology:  Diabetes - DPP-4 Inhibitors - linagliptin Passed - 08/03/2018  1:52 PM      Passed - HBA1C is between 0 and 7.9 and within 180 days    Hemoglobin A1C  Date Value Ref Range Status  06/16/2018 7.1 (A) 4.0 - 5.6 % Final   Hgb A1c MFr Bld  Date Value Ref Range Status  11/18/2017 7.5 (H) 4.6 - 6.5 % Final    Comment:    Glycemic Control Guidelines for People with Diabetes:Non Diabetic:  <6%Goal of Therapy: <7%Additional Action Suggested:  >8%          Passed - Valid encounter within last 6 months    Recent Outpatient Visits          2 weeks ago Claudication [facility])   [place], Olevia Bowens, DO   1 month ago Lumbar radiculopathy   [place], [place], DO   2 months ago Claudication [facility])   [facility] -Georges Mouse, MD  6 months ago Cough   [place], MD   8 months ago Diabetes mellitus type 2, noninsulin dependent [facility])   [facility] -Georges Mouse, MD      Future Appointments            In 2 months Lyndal Pulley, DO [place], [place]   In 4 months Jenny Reichmann, Hunt Oris, MD [place], [facility]         . metFORMIN (GLUCOPHAGE) 1000 MG tablet 180 tablet 0    Sig: TAKE 1 TABLET BY MOUTH TWICE A DAY WITH A MEAL     Endocrinology:   Diabetes - Biguanides Passed - 08/03/2018  1:52 PM      Passed - Cr in normal range and within 360 days    Creat  Date Value Ref Range Status  11/14/2016 0.89 0.70 - 1.18 mg/dL Final    Comment:      For patients > or = 72 years of age: The upper reference limit for Creatinine is approximately 13% higher for people identified as African-American.      Creatinine, Ser  Date Value Ref Range Status  05/28/2018 0.94 0.40 - 1.50 mg/dL Final         Passed - HBA1C is between 0 and 7.9 and within 180 days    Hemoglobin A1C  Date Value Ref Range Status  06/16/2018 7.1 (A) 4.0 - 5.6 % Final   Hgb A1c MFr Bld  Date Value Ref Range Status  11/18/2017 7.5 (H) 4.6 - 6.5 % Final    Comment:    Glycemic Control Guidelines for People with Diabetes:Non Diabetic:  <6%Goal of Therapy: <7%Additional Action Suggested:  >8%          Passed - eGFR in normal range and within 360 days    GFR, Est African American  Date Value Ref Range Status  06/16/2014 86 mL/min Final   GFR calc Af Amer  Date Value Ref Range Status  09/06/2014 >90 >90 mL/min Final    Comment:    (NOTE) The eGFR has been calculated using the CKD EPI equation. This calculation has not been validated in all clinical situations. eGFR's persistently <90 mL/min signify possible Chronic Kidney Disease.    GFR, Est Non African American  Date Value Ref Range Status  06/16/2014 74 mL/min Final    Comment:      The estimated GFR is a calculation valid for adults (>=47 years old) that uses the CKD-EPI algorithm to adjust for age and sex. It is   not to be used for children, pregnant women, hospitalized patients,    patients on dialysis, or with rapidly changing kidney function. According to the NKDEP, eGFR >89 is normal, 60-89 shows mild impairment, 30-59 shows moderate impairment, 15-29 shows severe impairment and <15 is ESRD.     GFR calc non Af Amer  Date Value Ref Range Status  09/06/2014 86 (L) >90 mL/min Final    GFR  Date Value Ref Range Status  05/28/2018 83.83 >60.00 mL/min Final         Passed - Valid encounter within last 6 months    Recent Outpatient Visits          2 weeks ago Claudication [facility])   [place], Olevia Bowens, DO   1 month ago Lumbar radiculopathy   [place], [place], DO  2 months ago Claudication [facility])   [facility] -Georges Mouse, MD   6 months ago Cough   [place], MD   8 months ago Diabetes mellitus type 2, noninsulin dependent [facility])   [facility] -Georges Mouse, MD      Future Appointments            In 2 months Lyndal Pulley, DO [place], [place]   In 4 months Jenny Reichmann, Hunt Oris, MD [place], [facility]         . omeprazole (PRILOSEC) 40 MG capsule 90 capsule 0    Sig: Take 1 capsule (40 mg total) by mouth daily.     Gastroenterology: Proton Pump Inhibitors Passed - 08/03/2018  1:52 PM      Passed - Valid encounter within last 12 months    Recent Outpatient Visits          2 weeks ago Claudication [facility])   [place], Olevia Bowens, DO   1 month ago Lumbar radiculopathy   [place], [place], DO   2 months ago Claudication [facility])   [place] Sylas, James W, MD   6 months ago Cough   [place], MD   8 months ago Diabetes mellitus type 2, noninsulin dependent [facility])   [facility] -Georges Mouse, MD      Future Appointments            In 2 months Lyndal Pulley, DO [place], [place]   In 4 months Jenny Reichmann, Hunt Oris, MD [place], [place]         . pioglitazone (ACTOS) 45 MG tablet 90 tablet 3    Sig: Take 1 tablet (45 mg total) by mouth  daily.     Endocrinology:  Diabetes - Glitazones - pioglitazone Passed - 08/03/2018  1:52 PM      Passed - HBA1C is between 0 and 7.9 and within 180 days    Hemoglobin A1C  Date Value Ref Range Status  06/16/2018 7.1 (A) 4.0 - 5.6 % Final   Hgb A1c MFr Bld  Date Value Ref Range Status  11/18/2017 7.5 (H) 4.6 - 6.5 % Final    Comment:    Glycemic Control Guidelines for People with Diabetes:Non Diabetic:  <6%Goal of Therapy: <7%Additional Action Suggested:  >8%          Passed - Valid encounter within last 6 months    Recent Outpatient Visits          2 weeks ago Claudication [facility])   [place], Olevia Bowens, DO   1 month ago Lumbar radiculopathy   [place], [place], DO   2 months ago Claudication [facility])   [place] Ananda, James W, MD   6 months ago Cough   [place], MD   8 months ago Diabetes mellitus type 2, noninsulin dependent [facility])   [facility] -Georges Mouse, MD      Future Appointments            In 2 months Lyndal Pulley, DO [place], [facility]   In  4 months Biagio Borg, MD [place], [facility]

## 2018-08-06 ENCOUNTER — Encounter (HOSPITAL_COMMUNITY)
Admission: RE | Admit: 2018-08-06 | Discharge: 2018-08-06 | Disposition: A | Discharge status: Home / Self Care | Payer: Self-pay | Source: Ambulatory Visit | Attending: Cardiology | Admitting: Cardiology

## 2018-08-06 DIAGNOSIS — L570 Actinic keratosis: Secondary | ICD-10-CM | POA: Diagnosis not present

## 2018-08-07 ENCOUNTER — Encounter (HOSPITAL_COMMUNITY): Payer: Self-pay

## 2018-08-10 ENCOUNTER — Encounter (HOSPITAL_COMMUNITY)
Admission: RE | Admit: 2018-08-10 | Discharge: 2018-08-10 | Disposition: A | Discharge status: Home / Self Care | Payer: Self-pay | Source: Ambulatory Visit | Attending: Cardiology | Admitting: Cardiology

## 2018-08-11 ENCOUNTER — Encounter (HOSPITAL_COMMUNITY)
Admission: RE | Admit: 2018-08-11 | Discharge: 2018-08-11 | Disposition: A | Discharge status: Home / Self Care | Payer: Self-pay | Source: Ambulatory Visit | Attending: Cardiology | Admitting: Cardiology

## 2018-08-13 ENCOUNTER — Encounter (HOSPITAL_COMMUNITY)
Admission: RE | Admit: 2018-08-13 | Discharge: 2018-08-13 | Disposition: A | Discharge status: Home / Self Care | Payer: Self-pay | Source: Ambulatory Visit | Attending: Cardiology | Admitting: Cardiology

## 2018-08-14 ENCOUNTER — Encounter (HOSPITAL_COMMUNITY): Payer: Self-pay

## 2018-08-14 DIAGNOSIS — I2510 Atherosclerotic heart disease of native coronary artery without angina pectoris: Secondary | ICD-10-CM | POA: Insufficient documentation

## 2018-08-14 DIAGNOSIS — I714 Abdominal aortic aneurysm, without rupture: Secondary | ICD-10-CM | POA: Insufficient documentation

## 2018-08-14 DIAGNOSIS — I10 Essential (primary) hypertension: Secondary | ICD-10-CM | POA: Insufficient documentation

## 2018-08-14 DIAGNOSIS — E7800 Pure hypercholesterolemia, unspecified: Secondary | ICD-10-CM | POA: Insufficient documentation

## 2018-08-18 ENCOUNTER — Encounter (HOSPITAL_COMMUNITY)
Admission: RE | Admit: 2018-08-18 | Discharge: 2018-08-18 | Disposition: A | Discharge status: Home / Self Care | Payer: Self-pay | Source: Ambulatory Visit | Attending: Cardiology | Admitting: Cardiology

## 2018-08-19 ENCOUNTER — Encounter (HOSPITAL_COMMUNITY)
Admission: RE | Admit: 2018-08-19 | Discharge: 2018-08-19 | Disposition: A | Discharge status: Home / Self Care | Payer: Self-pay | Source: Ambulatory Visit | Attending: Cardiology | Admitting: Cardiology

## 2018-08-19 MED ORDER — LISINOPRIL 2.5 MG PO TABS: 2.5000 mg | ORAL_TABLET | Freq: Every day | ORAL | 0 refills | Status: DC

## 2018-08-19 NOTE — Addendum Note (Signed)
Addended by: Addison Naegeli on: 08/19/2018 11:54 AM   Modules accepted: Orders

## 2018-08-19 NOTE — Telephone Encounter (Signed)
Pt called stating that the lisinopril was not sent in - he is almost out and needing this to be sent to mail order for 90 day supply.   [facility]

## 2018-08-20 ENCOUNTER — Encounter (HOSPITAL_COMMUNITY)
Admission: RE | Admit: 2018-08-20 | Discharge: 2018-08-20 | Disposition: A | Discharge status: Home / Self Care | Payer: Self-pay | Source: Ambulatory Visit | Attending: Cardiology | Admitting: Cardiology

## 2018-08-21 ENCOUNTER — Encounter (HOSPITAL_COMMUNITY): Payer: Self-pay

## 2018-08-24 ENCOUNTER — Encounter (HOSPITAL_COMMUNITY)
Admission: RE | Admit: 2018-08-24 | Discharge: 2018-08-24 | Disposition: A | Discharge status: Home / Self Care | Payer: Self-pay | Source: Ambulatory Visit | Attending: Cardiology | Admitting: Cardiology

## 2018-08-24 DIAGNOSIS — Z85828 Personal history of other malignant neoplasm of skin: Secondary | ICD-10-CM | POA: Diagnosis not present

## 2018-08-24 DIAGNOSIS — C44329 Squamous cell carcinoma of skin of other parts of face: Secondary | ICD-10-CM | POA: Diagnosis not present

## 2018-08-25 ENCOUNTER — Encounter (HOSPITAL_COMMUNITY)
Admission: RE | Admit: 2018-08-25 | Discharge: 2018-08-25 | Disposition: A | Discharge status: Home / Self Care | Payer: Self-pay | Source: Ambulatory Visit | Attending: Cardiology | Admitting: Cardiology

## 2018-08-27 ENCOUNTER — Encounter (HOSPITAL_COMMUNITY)
Admission: RE | Admit: 2018-08-27 | Discharge: 2018-08-27 | Disposition: A | Discharge status: Home / Self Care | Payer: Self-pay | Source: Ambulatory Visit | Attending: Cardiology | Admitting: Cardiology

## 2018-08-28 ENCOUNTER — Encounter (HOSPITAL_COMMUNITY): Payer: Self-pay

## 2018-08-31 ENCOUNTER — Encounter (HOSPITAL_COMMUNITY)
Admission: RE | Admit: 2018-08-31 | Discharge: 2018-08-31 | Disposition: A | Discharge status: Home / Self Care | Payer: Self-pay | Source: Ambulatory Visit | Attending: Cardiology | Admitting: Cardiology

## 2018-09-01 ENCOUNTER — Encounter (HOSPITAL_COMMUNITY): Payer: Self-pay

## 2018-09-03 ENCOUNTER — Encounter (HOSPITAL_COMMUNITY)
Admission: RE | Admit: 2018-09-03 | Discharge: 2018-09-03 | Disposition: A | Discharge status: Home / Self Care | Payer: Self-pay | Source: Ambulatory Visit | Attending: Cardiology | Admitting: Cardiology

## 2018-09-03 ENCOUNTER — Other Ambulatory Visit: Payer: Self-pay

## 2018-09-04 ENCOUNTER — Encounter (HOSPITAL_COMMUNITY): Payer: Self-pay

## 2018-09-08 ENCOUNTER — Encounter (HOSPITAL_COMMUNITY): Payer: Self-pay

## 2018-09-14 ENCOUNTER — Encounter (HOSPITAL_COMMUNITY)
Admission: RE | Admit: 2018-09-14 | Discharge: 2018-09-14 | Disposition: A | Discharge status: Home / Self Care | Payer: Self-pay | Source: Ambulatory Visit | Attending: Cardiology | Admitting: Cardiology

## 2018-09-14 DIAGNOSIS — I2510 Atherosclerotic heart disease of native coronary artery without angina pectoris: Secondary | ICD-10-CM | POA: Insufficient documentation

## 2018-09-14 DIAGNOSIS — I714 Abdominal aortic aneurysm, without rupture: Secondary | ICD-10-CM | POA: Insufficient documentation

## 2018-09-14 DIAGNOSIS — I10 Essential (primary) hypertension: Secondary | ICD-10-CM | POA: Insufficient documentation

## 2018-09-14 DIAGNOSIS — E7800 Pure hypercholesterolemia, unspecified: Secondary | ICD-10-CM | POA: Insufficient documentation

## 2018-09-15 ENCOUNTER — Encounter (HOSPITAL_COMMUNITY)
Admission: RE | Admit: 2018-09-15 | Discharge: 2018-09-15 | Disposition: A | Discharge status: Home / Self Care | Payer: Self-pay | Source: Ambulatory Visit | Attending: Cardiology | Admitting: Cardiology

## 2018-09-17 ENCOUNTER — Encounter (HOSPITAL_COMMUNITY)
Admission: RE | Admit: 2018-09-17 | Discharge: 2018-09-17 | Disposition: A | Discharge status: Home / Self Care | Payer: Self-pay | Source: Ambulatory Visit | Attending: Cardiology | Admitting: Cardiology

## 2018-09-17 DIAGNOSIS — D692 Other nonthrombocytopenic purpura: Secondary | ICD-10-CM | POA: Diagnosis not present

## 2018-09-17 DIAGNOSIS — L570 Actinic keratosis: Secondary | ICD-10-CM | POA: Diagnosis not present

## 2018-09-17 DIAGNOSIS — D485 Neoplasm of uncertain behavior of skin: Secondary | ICD-10-CM | POA: Diagnosis not present

## 2018-09-17 DIAGNOSIS — Z85828 Personal history of other malignant neoplasm of skin: Secondary | ICD-10-CM | POA: Diagnosis not present

## 2018-09-17 DIAGNOSIS — C44629 Squamous cell carcinoma of skin of left upper limb, including shoulder: Secondary | ICD-10-CM | POA: Diagnosis not present

## 2018-09-18 ENCOUNTER — Encounter (HOSPITAL_COMMUNITY): Payer: Self-pay

## 2018-09-21 ENCOUNTER — Encounter (HOSPITAL_COMMUNITY)
Admission: RE | Admit: 2018-09-21 | Discharge: 2018-09-21 | Disposition: A | Discharge status: Home / Self Care | Payer: Self-pay | Source: Ambulatory Visit | Attending: Cardiology | Admitting: Cardiology

## 2018-09-22 ENCOUNTER — Encounter (HOSPITAL_COMMUNITY)
Admission: RE | Admit: 2018-09-22 | Discharge: 2018-09-22 | Disposition: A | Discharge status: Home / Self Care | Payer: Self-pay | Source: Ambulatory Visit | Attending: Cardiology | Admitting: Cardiology

## 2018-09-24 ENCOUNTER — Encounter (HOSPITAL_COMMUNITY)
Admission: RE | Admit: 2018-09-24 | Discharge: 2018-09-24 | Disposition: A | Discharge status: Home / Self Care | Payer: Self-pay | Source: Ambulatory Visit | Attending: Cardiology | Admitting: Cardiology

## 2018-09-25 ENCOUNTER — Encounter (HOSPITAL_COMMUNITY): Payer: Self-pay

## 2018-09-28 ENCOUNTER — Encounter (HOSPITAL_COMMUNITY)
Admission: RE | Admit: 2018-09-28 | Discharge: 2018-09-28 | Disposition: A | Discharge status: Home / Self Care | Payer: Self-pay | Source: Ambulatory Visit | Attending: Cardiology | Admitting: Cardiology

## 2018-09-29 ENCOUNTER — Ambulatory Visit (INDEPENDENT_AMBULATORY_CARE_PROVIDER_SITE_OTHER): Payer: Medicare Other | Admitting: Vascular Surgery

## 2018-09-29 ENCOUNTER — Encounter (HOSPITAL_COMMUNITY)
Admission: RE | Admit: 2018-09-29 | Discharge: 2018-09-29 | Disposition: A | Discharge status: Home / Self Care | Payer: Self-pay | Source: Ambulatory Visit | Attending: Cardiology | Admitting: Cardiology

## 2018-09-29 ENCOUNTER — Other Ambulatory Visit: Payer: Self-pay

## 2018-09-29 ENCOUNTER — Encounter: Payer: Self-pay | Admitting: Vascular Surgery

## 2018-09-29 VITALS — BP 103/62 | HR 89 | Temp 97.3°F | Resp 20 | Ht 73.0 in | Wt 262.0 lb

## 2018-09-29 DIAGNOSIS — I739 Peripheral vascular disease, unspecified: Secondary | ICD-10-CM | POA: Diagnosis not present

## 2018-09-29 NOTE — Progress Notes (Signed)
Patient name: MEHTAB DOLBERRY MRN: [id number] DOB: Oct 13, 1946 Sex: male  REASON FOR CONSULT: Right lower extremity claudication  HPI: KITAI PURDOM is a 72 y.o. male with history of coronary artery disease status post PCI, hypertension, diabetes as well as previous craniotomy that presents for 3 month follow-up for his right leg claudication.  He reports that his symptoms have seen significant improvement over the last 3 months since starting Pletal and doing walking therapy and cardiac rehab.  States he can now walk 8,000-10,000 steps a day.  He does notice some discomfort in his thigh and calf initially when starting on the treadmill but states as he continues walking this usually resolves.  Overall he seems very pleased with the progress.  He denies any rest pain or tissue loss in his feet.  No symptoms with his left leg.  Past Medical History:  Diagnosis Date  . Allergy   . Arthritis   . CAD (coronary artery disease)   . Erectile dysfunction   . History of colon polyps   . History of diverticulitis   . HYPERTENSION   . Hypothyroidism   . Kidney stones    "passed it"  . Peripheral arterial disease ([place])   . SAH (subarachnoid hemorrhage) ([place])    Following syncopal episode  . SDH (subdural hematoma) (H[facility])    Following syncopal episode  . Seizures ([place])    08/2014 - controlled  . Sleep apnea    "mild"  does not use CPAP  . Sleep apnea in adult   . Type II diabetes mellitus ([place])    type 2    Past Surgical History:  Procedure Laterality Date  . COLONOSCOPY  2014   polyp  . CORONARY ANGIOPLASTY WITH STENT PLACEMENT  05/11/2014   "1"  . CRANIOTOMY Left 09/12/2014   Procedure: CRANIOTOMY HEMATOMA EVACUATION SUBDURAL;  Surgeon: Erline Levine, MD;  Location: [facility]URO [facility]S;  Service: Neurosurgery;  Laterality: Left;  . FRACTIONAL FLOW RESERVE WIRE  05/11/2014   Procedure: FRACTIONAL FLOW RESERVE WIRE;  Surgeon: [name], MD;  Location: [facility] CATH LAB;  Service:  Cardiovascular;;  . LEFT HEART CATHETERIZATION WITH CORONARY ANGIOGRAM N/A 05/11/2014   Procedure: LEFT HEART CATHETERIZATION WITH CORONARY ANGIOGRAM;  Surgeon: [name], MD;  Location: [facility] CATH LAB;  Service: Cardiovascular;  Laterality: N/A;  . POLYPECTOMY      Family History  Problem Relation Age of Onset  . Lung cancer Father        dad was a smoker  . Heart disease Father   . AAA (abdominal aortic aneurysm) Father   . Heart disease Mother   . Ataxia Maternal Grandmother   . Dementia Maternal Grandmother   . Aplastic anemia Sister   . Prostate cancer Brother   . Lung cancer Cousin   . Colon cancer Neg Hx   . Rectal cancer Neg Hx   . Stomach cancer Neg Hx   . Colon polyps Neg Hx   . Esophageal cancer Neg Hx     SOCIAL HISTORY: Social History   Socioeconomic History  . Marital status: Married    Spouse name: Beverlee Nims  . Number of children: 3  . Years of education: 48  . Highest education level: Not on file  Occupational History  . 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 Occupation: [phone] (CELL)    Employer: [employer]  Social Needs  .  Financial resource strain: Not on file  . Food insecurity:    Worry: Not on file    Inability: Not on file  . Transportation needs:    Medical: Not on file    Non-medical: Not on file  Tobacco Use  . Smoking status: Former Smoker    Packs/day: 1.00    Years: 30.00    Pack years: 30.00    Types: Cigarettes    Last attempt to quit: 10/15/1983    Years since quitting: 34.9  . Smokeless tobacco: Never Used  Substance and Sexual Activity  . Alcohol use: No    Alcohol/week: 0.0 standard drinks  . Drug use: No  . Sexual activity: Yes  Lifestyle  . Physical activity:    Days per week: Not on file    Minutes per session: Not on file  . Stress: Not on file  Relationships  . Social connections:    Talks on phone: Not on file    Gets  together: Not on file    Attends religious service: Not on file    Active member of club or organization: Not on file    Attends meetings of clubs or organizations: Not on file    Relationship status: Not on file  . Intimate partner violence:    Fear of current or ex partner: Not on file    Emotionally abused: Not on file    Physically abused: Not on file    Forced sexual activity: Not on file  Other Topics Concern  . Not on file  Social History Narrative   Patient is married with 3 daughters 30, 63, 79 and 2 grandchildren.   Patient is right handed.   Patient has hs education.   Patient drinks decaf, and soda occasionally.    No Known Allergies  Current Outpatient Medications  Medication Sig Dispense Refill  . Ascorbic Acid (VITAMIN C PO) Take 1 tablet by mouth.    Marland Kitchen aspirin EC 81 MG tablet Take 81 mg by mouth daily.    Marland Kitchen atorvastatin (LIPITOR) 80 MG tablet Take 1 tablet (80 mg total) by mouth daily. 90 tablet 0  . carvedilol (COREG) 3.125 MG tablet TAKE 1 TABLET BY MOUTH TWICE A DAY WITH A MEAL 180 tablet 0  . cilostazol (PLETAL) 100 MG tablet Take 1 tablet (100 mg total) by mouth 2 (two) times daily. 60 tablet 11  . levETIRAcetam (KEPPRA) 1000 MG tablet Take 1 tablet (1,000 mg total) by mouth 2 (two) times daily. 180 tablet 0  . levothyroxine (SYNTHROID, LEVOTHROID) 150 MCG tablet Take 1 tablet (150 mcg total) by mouth daily. 90 tablet 0  . linagliptin (TRADJENTA) 5 MG TABS tablet Take 1 tablet (5 mg total) by mouth daily. 90 tablet 0  . lisinopril (PRINIVIL,ZESTRIL) 2.5 MG tablet Take 1 tablet (2.5 mg total) by mouth daily. 90 tablet 0  . metFORMIN (GLUCOPHAGE) 1000 MG tablet TAKE 1 TABLET BY MOUTH TWICE A DAY WITH A MEAL 180 tablet 0  . nitroGLYCERIN (NITROSTAT) 0.4 MG SL tablet PLACE 1 TABLET UNDER THE   TONGUE AND ALLOW TO        DISSOLVE EVERY 5 MINUTES ASNEEDED FOR CHEST PAIN 25 tablet 12  . omeprazole (PRILOSEC) 40 MG capsule Take 1 capsule (40 mg total) by mouth daily. 90  capsule 0  . ONE TOUCH ULTRA TEST test strip USE TO CHECK BLOOD SUGAR TWICE A DAY AS DIRECTED 100 each 0  . pioglitazone (ACTOS) 45 MG tablet Take 1 tablet (  45 mg total) by mouth daily. 90 tablet 3  . tiZANidine (ZANAFLEX) 2 MG tablet Take 1 tablet (2 mg total) by mouth at bedtime as needed for muscle spasms. 30 tablet 5   No current facility-administered medications for this visit.     REVIEW OF SYSTEMS:  [X]  denotes positive finding, [ ]  denotes negative finding Cardiac  Comments:  Chest pain or chest pressure:    Shortness of breath upon exertion:    Short of breath when lying flat:    Irregular heart rhythm:        Vascular    Pain in calf, thigh, or hip brought on by ambulation: x   Pain in feet at night that wakes you up from your sleep:     Blood clot in your veins:    Leg swelling:         Pulmonary    Oxygen at home:    Productive cough:     Wheezing:         Neurologic    Sudden weakness in arms or legs:     Sudden numbness in arms or legs:     Sudden onset of difficulty speaking or slurred speech:    Temporary loss of vision in one eye:     Problems with dizziness:         Gastrointestinal    Blood in stool:     Vomited blood:         Genitourinary    Burning when urinating:     Blood in urine:        Psychiatric    Major depression:         Hematologic    Bleeding problems:    Problems with blood clotting too easily:        Skin    Rashes or ulcers:        Constitutional    Fever or chills:      PHYSICAL EXAM: Vitals:   09/29/18 0953  BP: 103/62  Pulse: 89  Resp: 20  Temp: (!) 97.3 F (36.3 C)  SpO2: 97%  Weight: 262 lb (118.8 kg)  Height: 6\' 1"  (1.854 m)    GENERAL: The patient is a well-nourished male, in no acute distress. The vital signs are documented above. CARDIAC: There is a regular rate and rhythm.  VASCULAR:  2+ femoral pulse palpable bilateral groin. No palpable right pedal pulse. 2+ palpable left dorsalis pedis  pulse. No tissue loss to either lower extremity.  No foot staining.  No dependent rubor. PULMONARY: There is good air exchange bilaterally without wheezing or rales. ABDOMEN: Soft and non-tender with normal pitched bowel sounds.  MUSCULOSKELETAL: There are no major deformities or cyanosis. NEUROLOGIC: No focal weakness or paresthesias are detected. SKIN: There are no ulcers or rashes noted. PSYCHIATRIC: The patient has a normal affect.  DATA:   Assessment/Plan:  72 year old male with history of right leg claudication that presents for 60-month follow-up.  He is doing much better on evaluation today and is now walking 8,000-10,000 steps in cardiac rehab with walking therapy and Pletal.  I discussed with him that we typically offer intervention for claudication in the setting of failed medical management but given the significant progress he has seen over the last 3 months we both agreed that there is no role for intervention at this time.  I will plan to see him back in 6 months with ABIs to monitor his progress and discussing contact her office if  he feels like his symptoms are worsening before then.  Overall very pleased with his progress.   Marty Heck, MD [facility] Office: [phone] Pager: [place]

## 2018-10-01 ENCOUNTER — Encounter (HOSPITAL_COMMUNITY): Payer: Self-pay

## 2018-10-02 ENCOUNTER — Encounter (HOSPITAL_COMMUNITY): Payer: Self-pay

## 2018-10-06 ENCOUNTER — Encounter (HOSPITAL_COMMUNITY): Payer: Self-pay

## 2018-10-08 ENCOUNTER — Encounter (HOSPITAL_COMMUNITY): Payer: Self-pay

## 2018-10-09 ENCOUNTER — Encounter (HOSPITAL_COMMUNITY): Payer: Self-pay

## 2018-10-13 ENCOUNTER — Encounter (HOSPITAL_COMMUNITY): Payer: Self-pay

## 2018-10-15 ENCOUNTER — Encounter (HOSPITAL_COMMUNITY)
Admission: RE | Admit: 2018-10-15 | Discharge: 2018-10-15 | Disposition: A | Discharge status: Home / Self Care | Payer: Self-pay | Source: Ambulatory Visit | Attending: Cardiology | Admitting: Cardiology

## 2018-10-15 DIAGNOSIS — I10 Essential (primary) hypertension: Secondary | ICD-10-CM | POA: Insufficient documentation

## 2018-10-15 DIAGNOSIS — E7800 Pure hypercholesterolemia, unspecified: Secondary | ICD-10-CM | POA: Insufficient documentation

## 2018-10-15 DIAGNOSIS — I2510 Atherosclerotic heart disease of native coronary artery without angina pectoris: Secondary | ICD-10-CM | POA: Insufficient documentation

## 2018-10-15 DIAGNOSIS — I714 Abdominal aortic aneurysm, without rupture: Secondary | ICD-10-CM | POA: Insufficient documentation

## 2018-10-16 ENCOUNTER — Encounter (HOSPITAL_COMMUNITY): Payer: Self-pay

## 2018-10-19 ENCOUNTER — Encounter (HOSPITAL_COMMUNITY)
Admission: RE | Admit: 2018-10-19 | Discharge: 2018-10-19 | Disposition: A | Discharge status: Home / Self Care | Payer: Self-pay | Source: Ambulatory Visit | Attending: Cardiology | Admitting: Cardiology

## 2018-10-20 ENCOUNTER — Ambulatory Visit: Payer: Medicare Other | Admitting: Family Medicine

## 2018-10-20 ENCOUNTER — Encounter (HOSPITAL_COMMUNITY)
Admission: RE | Admit: 2018-10-20 | Discharge: 2018-10-20 | Disposition: A | Discharge status: Home / Self Care | Payer: Self-pay | Source: Ambulatory Visit | Attending: Cardiology | Admitting: Cardiology

## 2018-10-21 ENCOUNTER — Telehealth: Payer: Self-pay | Admitting: Internal Medicine

## 2018-10-21 MED ORDER — LEVOTHYROXINE SODIUM 150 MCG PO TABS: 150.0000 ug | ORAL_TABLET | Freq: Every day | ORAL | 0 refills | Status: DC

## 2018-10-21 MED ORDER — METFORMIN HCL 1000 MG PO TABS: ORAL_TABLET | ORAL | 0 refills | Status: DC

## 2018-10-21 MED ORDER — LISINOPRIL 2.5 MG PO TABS: 2.5000 mg | ORAL_TABLET | Freq: Every day | ORAL | 0 refills | Status: DC

## 2018-10-21 MED ORDER — PIOGLITAZONE HCL 45 MG PO TABS: 45.0000 mg | ORAL_TABLET | Freq: Every day | ORAL | 3 refills | Status: DC

## 2018-10-21 MED ORDER — LINAGLIPTIN 5 MG PO TABS: 5.0000 mg | ORAL_TABLET | Freq: Every day | ORAL | 0 refills | Status: DC

## 2018-10-21 MED ORDER — CARVEDILOL 3.125 MG PO TABS: ORAL_TABLET | ORAL | 0 refills | Status: DC

## 2018-10-21 NOTE — Telephone Encounter (Signed)
Patient was last seen on 05/28/2018 and is scheduled for a 6 month follow up in February.

## 2018-10-21 NOTE — Telephone Encounter (Signed)
Pt has not been seen by PCP since 2018. Medication request denied.

## 2018-10-21 NOTE — Telephone Encounter (Signed)
I must have overlooked that visit. I will send 90 day supply of listed medications.

## 2018-10-21 NOTE — Telephone Encounter (Signed)
Copied from [place] [phone]. Topic: Quick Communication - Rx Refill/Question >> Oct 21, 2018 12:41 PM Percell Belt A wrote: Medication:  linagliptin (TRADJENTA) 5 MG TABS tablet [[id number]]  levothyroxine (SYNTHROID, LEVOTHROID) 150 MCG tablet [[id number]] omeprazole (PRILOSEC) 40 MG capsule [[id number]] carvedilol (COREG) 3.125 MG tablet [[id number]]  lisinopril (PRINIVIL,ZESTRIL) 2.5 MG tablet [[id number]] metFORMIN (GLUCOPHAGE) 1000 MG tablet [[id number]]  atorvastatin (LIPITOR) 80 MG tablet [[id number]]  pioglitazone (ACTOS) 45 MG tablet [[id number]  Has the patient contacted their pharmacy?mail order needs all new scripts  (Agent: If no, request that the patient contact the pharmacy for the refill.) (Agent: If yes, when and what did the pharmacy advise?)  Preferred Pharmacy (with phone number or street name): [facility] [place] [place] to Registered [facility] [phone] (Phone)   Agent: Please be advised that RX refills may take up to 3 business days. We ask that you follow-up with your pharmacy.

## 2018-10-21 NOTE — Addendum Note (Signed)
Addended by: Juliet Rude on: 10/21/2018 02:13 PM   Modules accepted: Orders

## 2018-10-22 ENCOUNTER — Encounter (HOSPITAL_COMMUNITY)
Admission: RE | Admit: 2018-10-22 | Discharge: 2018-10-22 | Disposition: A | Discharge status: Home / Self Care | Payer: Self-pay | Source: Ambulatory Visit | Attending: Cardiology | Admitting: Cardiology

## 2018-10-23 ENCOUNTER — Encounter (HOSPITAL_COMMUNITY): Payer: Self-pay

## 2018-10-27 ENCOUNTER — Encounter (HOSPITAL_COMMUNITY): Payer: Self-pay

## 2018-10-29 ENCOUNTER — Encounter (HOSPITAL_COMMUNITY): Payer: Self-pay

## 2018-10-30 ENCOUNTER — Encounter (HOSPITAL_COMMUNITY): Payer: Self-pay

## 2018-11-03 ENCOUNTER — Encounter (HOSPITAL_COMMUNITY): Payer: Self-pay

## 2018-11-05 ENCOUNTER — Encounter (HOSPITAL_COMMUNITY)
Admission: RE | Admit: 2018-11-05 | Discharge: 2018-11-05 | Disposition: A | Discharge status: Home / Self Care | Payer: Self-pay | Source: Ambulatory Visit | Attending: Cardiology | Admitting: Cardiology

## 2018-11-06 ENCOUNTER — Encounter (HOSPITAL_COMMUNITY): Payer: Self-pay

## 2018-11-10 ENCOUNTER — Encounter (HOSPITAL_COMMUNITY): Payer: Self-pay

## 2018-11-10 ENCOUNTER — Telehealth: Payer: Self-pay | Admitting: Internal Medicine

## 2018-11-10 ENCOUNTER — Encounter: Payer: Self-pay | Admitting: Internal Medicine

## 2018-11-10 MED ORDER — ATORVASTATIN CALCIUM 80 MG PO TABS: 80.0000 mg | ORAL_TABLET | Freq: Every day | ORAL | 0 refills | Status: DC

## 2018-11-10 MED ORDER — OMEPRAZOLE 40 MG PO CPDR: 40.0000 mg | DELAYED_RELEASE_CAPSULE | Freq: Every day | ORAL | 0 refills | Status: DC

## 2018-11-10 NOTE — Telephone Encounter (Signed)
Copied from [place] [id number]. Topic: Quick Communication - Rx Refill/Question >> Nov 10, 2018  5:04 PM Selinda Flavin B, NT wrote: **Patient requesting 90 day supply**  Medication: omeprazole (PRILOSEC) 40 MG capsule atorvastatin (LIPITOR) 80 MG tablet  Has the patient contacted their pharmacy? Yes.   (Agent: If no, request that the patient contact the pharmacy for the refill.) (Agent: If yes, when and what did the pharmacy advise?)  Preferred Pharmacy (with phone number or street name): [facility] [place], [place]: Please be advised that RX refills may take up to 3 business days. We ask that you follow-up with your pharmacy.

## 2018-11-10 NOTE — Telephone Encounter (Signed)
This encounter was created in error - please disregard.

## 2018-11-12 ENCOUNTER — Encounter (HOSPITAL_COMMUNITY)
Admission: RE | Admit: 2018-11-12 | Discharge: 2018-11-12 | Disposition: A | Discharge status: Home / Self Care | Payer: Self-pay | Source: Ambulatory Visit | Attending: Cardiology | Admitting: Cardiology

## 2018-11-13 ENCOUNTER — Encounter (HOSPITAL_COMMUNITY): Payer: Self-pay

## 2018-11-16 NOTE — Telephone Encounter (Signed)
Patient needs also refill on his lisinopril (PRINIVIL,ZESTRIL) 2.5 MG tablet send to his mail order pharmacy.

## 2018-11-16 NOTE — Telephone Encounter (Signed)
Pt called and pt states that he just found out that the pharmacy had received refill of Lisinopril. No other concerns voiced at this time.

## 2018-11-17 ENCOUNTER — Encounter (HOSPITAL_COMMUNITY)
Admission: RE | Admit: 2018-11-17 | Discharge: 2018-11-17 | Disposition: A | Discharge status: Home / Self Care | Payer: Self-pay | Source: Ambulatory Visit | Attending: Cardiology | Admitting: Cardiology

## 2018-11-17 DIAGNOSIS — I10 Essential (primary) hypertension: Secondary | ICD-10-CM | POA: Insufficient documentation

## 2018-11-17 DIAGNOSIS — E7800 Pure hypercholesterolemia, unspecified: Secondary | ICD-10-CM | POA: Insufficient documentation

## 2018-11-17 DIAGNOSIS — I2510 Atherosclerotic heart disease of native coronary artery without angina pectoris: Secondary | ICD-10-CM | POA: Insufficient documentation

## 2018-11-17 DIAGNOSIS — I714 Abdominal aortic aneurysm, without rupture: Secondary | ICD-10-CM | POA: Insufficient documentation

## 2018-11-18 ENCOUNTER — Encounter (HOSPITAL_COMMUNITY)
Admission: RE | Admit: 2018-11-18 | Discharge: 2018-11-18 | Disposition: A | Discharge status: Home / Self Care | Payer: Self-pay | Source: Ambulatory Visit | Attending: Cardiology | Admitting: Cardiology

## 2018-11-19 ENCOUNTER — Encounter (HOSPITAL_COMMUNITY)
Admission: RE | Admit: 2018-11-19 | Discharge: 2018-11-19 | Disposition: A | Discharge status: Home / Self Care | Payer: Self-pay | Source: Ambulatory Visit | Attending: Cardiology | Admitting: Cardiology

## 2018-11-20 ENCOUNTER — Encounter (HOSPITAL_COMMUNITY): Payer: Self-pay

## 2018-11-24 ENCOUNTER — Encounter (HOSPITAL_COMMUNITY): Payer: Self-pay

## 2018-11-26 ENCOUNTER — Encounter (HOSPITAL_COMMUNITY): Payer: Self-pay

## 2018-11-27 ENCOUNTER — Encounter (HOSPITAL_COMMUNITY): Payer: Self-pay

## 2018-11-30 ENCOUNTER — Encounter (HOSPITAL_COMMUNITY)
Admission: RE | Admit: 2018-11-30 | Discharge: 2018-11-30 | Disposition: A | Discharge status: Home / Self Care | Payer: Self-pay | Source: Ambulatory Visit | Attending: Cardiology | Admitting: Cardiology

## 2018-12-01 ENCOUNTER — Encounter (HOSPITAL_COMMUNITY)
Admission: RE | Admit: 2018-12-01 | Discharge: 2018-12-01 | Disposition: A | Discharge status: Home / Self Care | Payer: Self-pay | Source: Ambulatory Visit | Attending: Cardiology | Admitting: Cardiology

## 2018-12-03 ENCOUNTER — Encounter: Payer: Self-pay | Admitting: Internal Medicine

## 2018-12-03 ENCOUNTER — Encounter (HOSPITAL_COMMUNITY)
Admission: RE | Admit: 2018-12-03 | Discharge: 2018-12-03 | Disposition: A | Discharge status: Home / Self Care | Payer: Self-pay | Source: Ambulatory Visit | Attending: Cardiology | Admitting: Cardiology

## 2018-12-03 ENCOUNTER — Ambulatory Visit (INDEPENDENT_AMBULATORY_CARE_PROVIDER_SITE_OTHER): Payer: Medicare Other | Admitting: Internal Medicine

## 2018-12-03 VITALS — BP 112/60 | HR 90 | Temp 98.1°F | Ht 73.0 in | Wt 262.0 lb

## 2018-12-03 DIAGNOSIS — I10 Essential (primary) hypertension: Secondary | ICD-10-CM | POA: Diagnosis not present

## 2018-12-03 DIAGNOSIS — N320 Bladder-neck obstruction: Secondary | ICD-10-CM | POA: Diagnosis not present

## 2018-12-03 DIAGNOSIS — E039 Hypothyroidism, unspecified: Secondary | ICD-10-CM

## 2018-12-03 DIAGNOSIS — E119 Type 2 diabetes mellitus without complications: Secondary | ICD-10-CM

## 2018-12-03 DIAGNOSIS — E7800 Pure hypercholesterolemia, unspecified: Secondary | ICD-10-CM | POA: Diagnosis not present

## 2018-12-03 LAB — POCT GLYCOSYLATED HEMOGLOBIN (HGB A1C): Hemoglobin A1C: 7.5 % — AB (ref 4.0–5.6)

## 2018-12-03 NOTE — Progress Notes (Signed)
Subjective:    Patient ID: Allen Myers, male    DOB: Feb 27, 1946, 73 y.o.   MRN: [id number]  HPI  Here to f/u; overall doing ok,  Pt denies chest pain, increasing sob or doe, wheezing, orthopnea, PND, increased LE swelling, palpitations, dizziness or syncope.  Pt denies new neurological symptoms such as new headache, or facial or extremity weakness or numbness.  Pt denies polydipsia, polyuria, or low sugar episode.  Pt states overall good compliance with meds, mostly trying to follow appropriate diet, with wt overall stable,  but little exercise however. No BDR on eye exam yesterday.   Pt continues to have3 mo  recurring LBP without change in severity, bowel or bladder change, fever, wt loss,  worsening LE pain/numbness/weakness, gait change or falls, and sometimes helped with massage.   Pt denies fever, wt loss, night sweats, loss of appetite, or other constitutional symptoms.  Denies hyper or hypo thyroid symptoms such as voice, skin or hair change. Past Medical History:  Diagnosis Date  . Allergy   . Arthritis   . CAD (coronary artery disease)   . Erectile dysfunction   . History of colon polyps   . History of diverticulitis   . HYPERTENSION   . Hypothyroidism   . Kidney stones    "passed it"  . Peripheral arterial disease ([place])   . SAH (subarachnoid hemorrhage) ([place])    Following syncopal episode  . SDH (subdural hematoma) ([facility])    Following syncopal episode  . Seizures ([place])    08/2014 - controlled  . Sleep apnea    "mild"  does not use CPAP  . Sleep apnea in adult   . Type II diabetes mellitus ([place])    type 2   Past Surgical History:  Procedure Laterality Date  . COLONOSCOPY  2014   polyp  . CORONARY ANGIOPLASTY WITH STENT PLACEMENT  05/11/2014   "1"  . CRANIOTOMY Left 09/12/2014   Procedure: CRANIOTOMY HEMATOMA EVACUATION SUBDURAL;  Surgeon: Erline Levine, MD;  Location: [facility];  Service: Neurosurgery;  Laterality: Left;  . FRACTIONAL FLOW RESERVE WIRE  05/11/2014    Procedure: FRACTIONAL FLOW RESERVE WIRE;  Surgeon: [name], MD;  Location: [facility] CATH LAB;  Service: Cardiovascular;;  . LEFT HEART CATHETERIZATION WITH CORONARY ANGIOGRAM N/A 05/11/2014   Procedure: LEFT HEART CATHETERIZATION WITH CORONARY ANGIOGRAM;  Surgeon: [name], MD;  Location: [facility] CATH LAB;  Service: Cardiovascular;  Laterality: N/A;  . POLYPECTOMY      reports that he quit smoking about 35 years ago. His smoking use included cigarettes. He has a 30.00 pack-year smoking history. He has never used smokeless tobacco. He reports that he does not drink alcohol or use drugs. family history includes AAA (abdominal aortic aneurysm) in his father; Aplastic anemia in his sister; Ataxia in his maternal grandmother; Dementia in his maternal grandmother; Heart disease in his father and mother; Lung cancer in his cousin and father; Prostate cancer in his brother. No Known Allergies Current Outpatient Medications on File Prior to Visit  Medication Sig Dispense Refill  . Ascorbic Acid (VITAMIN C PO) Take 1 tablet by mouth.    Marland Kitchen aspirin EC 81 MG tablet Take 81 mg by mouth daily.    Marland Kitchen atorvastatin (LIPITOR) 80 MG tablet Take 1 tablet (80 mg total) by mouth daily. 90 tablet 0  . carvedilol (COREG) 3.125 MG tablet TAKE 1 TABLET BY MOUTH TWICE A DAY WITH A MEAL 180 tablet 0  .  cilostazol (PLETAL) 100 MG tablet Take 1 tablet (100 mg total) by mouth 2 (two) times daily. 60 tablet 11  . levETIRAcetam (KEPPRA) 1000 MG tablet Take 1 tablet (1,000 mg total) by mouth 2 (two) times daily. 180 tablet 0  . levothyroxine (SYNTHROID, LEVOTHROID) 150 MCG tablet Take 1 tablet (150 mcg total) by mouth daily. 90 tablet 0  . linagliptin (TRADJENTA) 5 MG TABS tablet Take 1 tablet (5 mg total) by mouth daily. 90 tablet 0  . lisinopril (PRINIVIL,ZESTRIL) 2.5 MG tablet Take 1 tablet (2.5 mg total) by mouth daily. 90 tablet 0  . metFORMIN (GLUCOPHAGE) 1000 MG tablet TAKE 1 TABLET BY MOUTH TWICE A DAY WITH A MEAL  180 tablet 0  . nitroGLYCERIN (NITROSTAT) 0.4 MG SL tablet PLACE 1 TABLET UNDER THE   TONGUE AND ALLOW TO        DISSOLVE EVERY 5 MINUTES ASNEEDED FOR CHEST PAIN 25 tablet 12  . omeprazole (PRILOSEC) 40 MG capsule Take 1 capsule (40 mg total) by mouth daily. 90 capsule 0  . ONE TOUCH ULTRA TEST test strip USE TO CHECK BLOOD SUGAR TWICE A DAY AS DIRECTED 100 each 0  . pioglitazone (ACTOS) 45 MG tablet Take 1 tablet (45 mg total) by mouth daily. 90 tablet 3  . tiZANidine (ZANAFLEX) 2 MG tablet Take 1 tablet (2 mg total) by mouth at bedtime as needed for muscle spasms. 30 tablet 5   No current facility-administered medications on file prior to visit.    Review of Systems  Constitutional: Negative for other unusual diaphoresis or sweats HENT: Negative for ear discharge or swelling Eyes: Negative for other worsening visual disturbances Respiratory: Negative for stridor or other swelling  Gastrointestinal: Negative for worsening distension or other blood Genitourinary: Negative for retention or other urinary change Musculoskeletal: Negative for other MSK pain or swelling Skin: Negative for color change or other new lesions Neurological: Negative for worsening tremors and other numbness  Psychiatric/Behavioral: Negative for worsening agitation or other fatigue All other system neg per pt    Objective:   Physical Exam BP 112/60   Pulse 90   Temp 98.1 F (36.7 C) (Oral)   Ht 6\' 1"  (1.854 m)   Wt 262 lb (118.8 kg)   SpO2 95%   BMI 34.57 kg/m  VS noted,  Constitutional: Pt appears in NAD HENT: Head: NCAT.  Right Ear: External ear normal.  Left Ear: External ear normal.  Eyes: . Pupils are equal, round, and reactive to light. Conjunctivae and EOM are normal Nose: without d/c or deformity Neck: Neck supple. Gross normal ROM Cardiovascular: Normal rate and regular rhythm.   Pulmonary/Chest: Effort normal and breath sounds without rales or wheezing.  Abd:  Soft, NT, ND, + BS, no  organomegaly Neurological: Pt is alert. At baseline orientation, motor grossly intact Skin: Skin is warm. No rashes, other new lesions, no LE edema Psychiatric: Pt behavior is normal without agitation  No other exam findings  Contains abnormal data POCT glycosylated hemoglobin (Hb A1C)   Ref Range & Units 11:16 (12/03/18) 27mo ago (06/16/18) 76yr ago (11/18/17) 79yr ago (05/16/17) 50yr ago (01/28/17) 45yr ago (08/24/15) 46yr ago (03/21/15)  Hemoglobin A1C 4.0 - 5.6 % 7.5Abnormal   7.1Abnormal   7.5High  R, CM 7.1High  R, CM 7.4High  R, CM 6.5 R, CM 6.4 R, CM           Assessment & Plan:

## 2018-12-03 NOTE — Assessment & Plan Note (Signed)
stable overall by history and exam, recent data reviewed with pt, and pt to continue medical treatment as before,  to f/u any worsening symptoms or concerns  

## 2018-12-03 NOTE — Assessment & Plan Note (Signed)
Mild uncontrolled, declines further med change such as add trulicity, wants to work on diet, activity and wt loss

## 2018-12-03 NOTE — Patient Instructions (Addendum)
Your A1c was OK today at 7.5, but please work on diet and activity and wt loss  Please continue all other medications as before, and refills have been done if requested.  Please have the pharmacy call with any other refills you may need.  Please continue your efforts at being more active, low cholesterol diet, and weight control.  You are otherwise up to date with prevention measures today.  Please keep your appointments with your specialists as you may have planned  Please return in 6 months, or sooner if needed, with Lab testing done 3-5 days before

## 2018-12-04 ENCOUNTER — Encounter (HOSPITAL_COMMUNITY): Payer: Self-pay

## 2018-12-07 ENCOUNTER — Encounter (HOSPITAL_COMMUNITY)
Admission: RE | Admit: 2018-12-07 | Discharge: 2018-12-07 | Disposition: A | Discharge status: Home / Self Care | Payer: Self-pay | Source: Ambulatory Visit | Attending: Cardiology | Admitting: Cardiology

## 2018-12-08 ENCOUNTER — Encounter (HOSPITAL_COMMUNITY)
Admission: RE | Admit: 2018-12-08 | Discharge: 2018-12-08 | Disposition: A | Discharge status: Home / Self Care | Payer: Self-pay | Source: Ambulatory Visit | Attending: Cardiology | Admitting: Cardiology

## 2018-12-10 ENCOUNTER — Encounter (HOSPITAL_COMMUNITY)
Admission: RE | Admit: 2018-12-10 | Discharge: 2018-12-10 | Disposition: A | Discharge status: Home / Self Care | Payer: Self-pay | Source: Ambulatory Visit | Attending: Cardiology | Admitting: Cardiology

## 2018-12-11 ENCOUNTER — Encounter (HOSPITAL_COMMUNITY): Payer: Self-pay
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
## 2018-12-15 ENCOUNTER — Encounter (HOSPITAL_COMMUNITY): Payer: Self-pay

## 2018-12-15 DIAGNOSIS — I2510 Atherosclerotic heart disease of native coronary artery without angina pectoris: Secondary | ICD-10-CM | POA: Insufficient documentation

## 2018-12-16 ENCOUNTER — Encounter: Payer: Self-pay | Admitting: Cardiology

## 2018-12-16 ENCOUNTER — Ambulatory Visit (INDEPENDENT_AMBULATORY_CARE_PROVIDER_SITE_OTHER): Payer: Medicare Other | Admitting: Cardiology

## 2018-12-16 VITALS — BP 124/62 | HR 81 | Ht 73.0 in | Wt 264.8 lb

## 2018-12-16 DIAGNOSIS — I714 Abdominal aortic aneurysm, without rupture, unspecified: Secondary | ICD-10-CM

## 2018-12-16 DIAGNOSIS — I2510 Atherosclerotic heart disease of native coronary artery without angina pectoris: Secondary | ICD-10-CM

## 2018-12-16 DIAGNOSIS — E7800 Pure hypercholesterolemia, unspecified: Secondary | ICD-10-CM | POA: Diagnosis not present

## 2018-12-16 DIAGNOSIS — I429 Cardiomyopathy, unspecified: Secondary | ICD-10-CM

## 2018-12-16 DIAGNOSIS — I10 Essential (primary) hypertension: Secondary | ICD-10-CM | POA: Diagnosis not present

## 2018-12-16 NOTE — Patient Instructions (Signed)
Medication Instructions:  NO CHANGE If you need a refill on your cardiac medications before your next appointment, please call your pharmacy.   Lab work: If you have labs (blood work) drawn today and your tests are completely normal, you will receive your results only by: Marland Kitchen MyChart Message (if you have MyChart) OR . A paper copy in the mail If you have any lab test that is abnormal or we need to change your treatment, we will call you to review the results.  Testing/Procedures: Your physician has requested that you have an echocardiogram. Echocardiography is a painless test that uses sound waves to create images of your heart. It provides your doctor with information about the size and shape of your heart and how well your heart's chambers and valves are working. This procedure takes approximately one hour. There are no restrictions for this procedure.  [place] has requested that you have an abdominal aorta duplex. During this test, an ultrasound is used to evaluate the aorta. Allow 30 minutes for this exam. Do not eat after midnight the day before and avoid carbonated beverages DUE IN MAY  Follow-Up: At [facility], you and your health needs are our priority.  As part of our continuing mission to provide you with exceptional heart care, we have created designated Provider Care Teams.  These Care Teams include your primary Cardiologist (physician) and Advanced Practice Providers (APPs -  Physician Assistants and Nurse Practitioners) who all work together to provide you with the care you need, when you need it. You will need a follow up appointment in 12 months.  Please call our office 2 months in advance to schedule this appointment.  You may see Kirk Ruths MD or one of the following Advanced Practice Providers on your designated Care Team:   Kerin Ransom, PA-C Roby Lofts, [place] . Sande Rives, PA-C

## 2018-12-16 NOTE — Addendum Note (Signed)
Addended by: Cristopher Estimable on: 12/16/2018 09:09 AM   Modules accepted: Orders

## 2018-12-17 ENCOUNTER — Encounter (HOSPITAL_COMMUNITY)
Admission: RE | Admit: 2018-12-17 | Discharge: 2018-12-17 | Disposition: A | Discharge status: Home / Self Care | Payer: Self-pay | Source: Ambulatory Visit | Attending: Cardiology | Admitting: Cardiology

## 2018-12-18 ENCOUNTER — Encounter (HOSPITAL_COMMUNITY): Payer: Self-pay

## 2018-12-21 ENCOUNTER — Encounter (HOSPITAL_COMMUNITY)
Admission: RE | Admit: 2018-12-21 | Discharge: 2018-12-21 | Disposition: A | Discharge status: Home / Self Care | Payer: Self-pay | Source: Ambulatory Visit | Attending: Cardiology | Admitting: Cardiology

## 2018-12-22 ENCOUNTER — Encounter (HOSPITAL_COMMUNITY)
Admission: RE | Admit: 2018-12-22 | Discharge: 2018-12-22 | Disposition: A | Discharge status: Home / Self Care | Payer: Self-pay | Source: Ambulatory Visit | Attending: Cardiology | Admitting: Cardiology

## 2018-12-23 ENCOUNTER — Encounter: Payer: Self-pay | Admitting: Family Medicine

## 2018-12-24 ENCOUNTER — Encounter (HOSPITAL_COMMUNITY)
Admission: RE | Admit: 2018-12-24 | Discharge: 2018-12-24 | Disposition: A | Discharge status: Home / Self Care | Payer: Self-pay | Source: Ambulatory Visit | Attending: Cardiology | Admitting: Cardiology

## 2018-12-24 ENCOUNTER — Other Ambulatory Visit: Payer: Self-pay

## 2018-12-25 ENCOUNTER — Encounter (HOSPITAL_COMMUNITY): Payer: Self-pay

## 2018-12-28 ENCOUNTER — Ambulatory Visit (HOSPITAL_COMMUNITY): Payer: Medicare Other | Attending: Cardiology

## 2018-12-28 ENCOUNTER — Other Ambulatory Visit: Payer: Self-pay

## 2018-12-28 DIAGNOSIS — I2510 Atherosclerotic heart disease of native coronary artery without angina pectoris: Secondary | ICD-10-CM

## 2018-12-28 DIAGNOSIS — I429 Cardiomyopathy, unspecified: Secondary | ICD-10-CM | POA: Diagnosis not present

## 2018-12-28 MED ORDER — PERFLUTREN LIPID MICROSPHERE
1.0000 mL | INTRAVENOUS | Status: AC | PRN
  Administered 2018-12-28: 1 mL via INTRAVENOUS

## 2018-12-29 ENCOUNTER — Encounter (HOSPITAL_COMMUNITY): Payer: Self-pay

## 2018-12-31 ENCOUNTER — Encounter (HOSPITAL_COMMUNITY): Payer: Self-pay

## 2019-01-01 ENCOUNTER — Encounter (HOSPITAL_COMMUNITY): Payer: Self-pay

## 2019-01-05 ENCOUNTER — Encounter (HOSPITAL_COMMUNITY): Payer: Self-pay

## 2019-01-05 ENCOUNTER — Telehealth (HOSPITAL_COMMUNITY): Payer: Self-pay | Admitting: Internal Medicine

## 2019-01-07 ENCOUNTER — Encounter (HOSPITAL_COMMUNITY): Payer: Self-pay

## 2019-01-08 ENCOUNTER — Encounter (HOSPITAL_COMMUNITY): Payer: Self-pay

## 2019-01-12 ENCOUNTER — Encounter (HOSPITAL_COMMUNITY): Payer: Self-pay

## 2019-01-14 ENCOUNTER — Encounter (HOSPITAL_COMMUNITY): Payer: Self-pay

## 2019-01-15 ENCOUNTER — Encounter (HOSPITAL_COMMUNITY): Payer: Self-pay

## 2019-01-19 ENCOUNTER — Encounter (HOSPITAL_COMMUNITY): Payer: Self-pay

## 2019-01-20 ENCOUNTER — Telehealth (HOSPITAL_COMMUNITY): Payer: Self-pay | Admitting: *Deleted

## 2019-01-20 NOTE — Telephone Encounter (Signed)
Called to notify patient that the cardiac and pulmonary rehabilitation department will be closed temporarily due to COVID-19 restrictions. Pt verbalized understanding.   Sol Passer, MS, ACSM CEP 01/20/2019 1425

## 2019-01-21 ENCOUNTER — Encounter (HOSPITAL_COMMUNITY): Payer: Self-pay

## 2019-01-22 ENCOUNTER — Encounter (HOSPITAL_COMMUNITY): Payer: Self-pay

## 2019-01-26 ENCOUNTER — Other Ambulatory Visit: Payer: Self-pay | Admitting: Internal Medicine

## 2019-01-26 ENCOUNTER — Encounter (HOSPITAL_COMMUNITY): Payer: Self-pay

## 2019-01-26 MED ORDER — LISINOPRIL 2.5 MG PO TABS: 2.5000 mg | ORAL_TABLET | Freq: Every day | ORAL | 1 refills | Status: DC

## 2019-01-26 MED ORDER — LINAGLIPTIN 5 MG PO TABS: 5.0000 mg | ORAL_TABLET | Freq: Every day | ORAL | 1 refills | Status: DC

## 2019-01-26 MED ORDER — OMEPRAZOLE 40 MG PO CPDR: 40.0000 mg | DELAYED_RELEASE_CAPSULE | Freq: Every day | ORAL | 1 refills | Status: DC

## 2019-01-26 NOTE — Telephone Encounter (Signed)
Requested Prescriptions  Pending Prescriptions Disp Refills  . omeprazole (PRILOSEC) 40 MG capsule 90 capsule 1    Sig: Take 1 capsule (40 mg total) by mouth daily.     Gastroenterology: Proton Pump Inhibitors Passed - 01/26/2019  2:02 PM      Passed - Valid encounter within last 12 months    Recent Outpatient Visits          1 month ago Hypothyroidism, unspecified type   [place] Pleas, Hunt Oris, MD   6 months ago Claudication [facility])   [place], [place], DO   7 months ago Lumbar radiculopathy   [place], [place], DO   8 months ago Claudication [facility])   [facility] -[facility] Mouse, MD   1 year ago Cough   [place], Thomas L, MD      Future Appointments            In 4 months Jenny Reichmann, Hunt Oris, MD [place], [facility]         . lisinopril (PRINIVIL,ZESTRIL) 2.5 MG tablet 90 tablet 1    Sig: Take 1 tablet (2.5 mg total) by mouth daily.     Cardiovascular:  ACE Inhibitors Failed - 01/26/2019  2:02 PM      Failed - Cr in normal range and within 180 days    Creat  Date Value Ref Range Status  11/14/2016 0.89 0.70 - 1.18 mg/dL Final    Comment:      For patients > or = 73 years of age: The upper reference limit for Creatinine is approximately 13% higher for people identified as African-American.      Creatinine, Ser  Date Value Ref Range Status  05/28/2018 0.94 0.40 - 1.50 mg/dL Final         Failed - K in normal range and within 180 days    Potassium  Date Value Ref Range Status  05/28/2018 4.5 3.5 - 5.1 mEq/L Final         Passed - Patient is not pregnant      Passed - Last BP in normal range    BP Readings from Last 1 Encounters:  12/28/18 130/64         Passed - Valid encounter within last 6 months    Recent Outpatient Visits          1 month ago Hypothyroidism, unspecified type   [place], James W, MD   6 months ago Claudication [facility])   [place], [place], DO   7 months ago Lumbar radiculopathy   [place], [place], DO   8 months ago Claudication [facility])   [place], James W, MD   1 year ago Cough   [place], MD      Future Appointments            In 4 months Biagio Borg, MD [facility]         . linagliptin (TRADJENTA) 5 MG TABS tablet 90 tablet 1    Sig: Take 1 tablet (5 mg total) by mouth daily.     Endocrinology:  Diabetes - DPP-4 Inhibitors - linagliptin Passed - 01/26/2019  2:02 PM  Passed - HBA1C is between 0 and 7.9 and within 180 days    Hemoglobin A1C  Date Value Ref Range Status  12/03/2018 7.5 (A) 4.0 - 5.6 % Final   Hgb A1c MFr Bld  Date Value Ref Range Status  11/18/2017 7.5 (H) 4.6 - 6.5 % Final    Comment:    Glycemic Control Guidelines for People with Diabetes:Non Diabetic:  <6%Goal of Therapy: <7%Additional Action Suggested:  >8%          Passed - Valid encounter within last 6 months    Recent Outpatient Visits          1 month ago Hypothyroidism, unspecified type   [place], James W, MD   6 months ago Claudication [facility])   [place], [place], DO   7 months ago Lumbar radiculopathy   [place], [place], DO   8 months ago Claudication [facility])   [facility] Mouse, MD   1 year ago Cough   [place], MD      Future Appointments            In 4 months Jenny Reichmann, Hunt Oris, MD [place], [facility]

## 2019-01-28 ENCOUNTER — Encounter (HOSPITAL_COMMUNITY): Payer: Self-pay

## 2019-01-29 ENCOUNTER — Encounter (HOSPITAL_COMMUNITY): Payer: Self-pay

## 2019-02-02 ENCOUNTER — Encounter (HOSPITAL_COMMUNITY): Payer: Self-pay

## 2019-02-04 ENCOUNTER — Encounter (HOSPITAL_COMMUNITY): Payer: Self-pay

## 2019-02-05 ENCOUNTER — Encounter (HOSPITAL_COMMUNITY): Payer: Self-pay

## 2019-02-09 ENCOUNTER — Encounter (HOSPITAL_COMMUNITY): Payer: Self-pay

## 2019-02-11 ENCOUNTER — Encounter (HOSPITAL_COMMUNITY): Payer: Self-pay

## 2019-02-12 ENCOUNTER — Encounter (HOSPITAL_COMMUNITY): Payer: Self-pay

## 2019-02-16 ENCOUNTER — Telehealth: Payer: Self-pay | Admitting: Internal Medicine

## 2019-02-16 ENCOUNTER — Other Ambulatory Visit: Payer: Self-pay | Admitting: Internal Medicine

## 2019-02-16 ENCOUNTER — Encounter: Payer: Self-pay | Admitting: *Deleted

## 2019-02-16 ENCOUNTER — Encounter (HOSPITAL_COMMUNITY): Payer: Self-pay

## 2019-02-16 ENCOUNTER — Telehealth: Payer: Self-pay | Admitting: *Deleted

## 2019-02-16 NOTE — Telephone Encounter (Signed)
Copied from [place] [id number]. Topic: Quick Communication - Rx Refill/Question >> Feb 16, 2019 10:43 AM Ahmed Prima L wrote: Medication: atorvastatin (LIPITOR) 80 MG tablet - carvedilol (COREG) 3.125 MG tablet - levETIRAcetam (KEPPRA) 1000 MG - metFORMIN (GLUCOPHAGE) 1000 MG tablet  Has the patient contacted their pharmacy?  Yes (Agent: If no, request that the patient contact the pharmacy for the refill.) (Agent: If yes, when and what did the pharmacy advise?)  Preferred Pharmacy (with phone number or street name): [facility] [place] [address] [place] [address] Phone: [phone] Fax: [phone]   Agent: Please be advised that RX refills may take up to 3 business days. We ask that you follow-up with your pharmacy.

## 2019-02-16 NOTE — Telephone Encounter (Signed)
See other note

## 2019-02-16 NOTE — Telephone Encounter (Signed)
Received fax from [facility] for keppra. Last seen yr ago.  Due to current COVID 19 pandemic, our office is severely reducing in office visits until further notice, in order to minimize the risk to our patients and healthcare providers.   Pt understands that although there may be some limitations with this type of visit, we will take all precautions to reduce any security or privacy concerns.  Pt understands that this will be treated like an in office visit and we will file with pt's insurance.  Consented to [email or web address] email.  Chart updated.  Email [email or web address] sent.

## 2019-02-17 ENCOUNTER — Other Ambulatory Visit: Payer: Self-pay | Admitting: *Deleted

## 2019-02-17 ENCOUNTER — Other Ambulatory Visit (HOSPITAL_COMMUNITY): Payer: Self-pay | Admitting: Cardiology

## 2019-02-17 ENCOUNTER — Ambulatory Visit (HOSPITAL_COMMUNITY)
Admission: RE | Admit: 2019-02-17 | Discharge: 2019-02-17 | Disposition: A | Discharge status: Home / Self Care | Payer: Medicare Other | Source: Ambulatory Visit | Attending: Cardiovascular Disease | Admitting: Cardiovascular Disease

## 2019-02-17 ENCOUNTER — Other Ambulatory Visit: Payer: Self-pay

## 2019-02-17 DIAGNOSIS — I714 Abdominal aortic aneurysm, without rupture, unspecified: Secondary | ICD-10-CM

## 2019-02-18 ENCOUNTER — Ambulatory Visit (INDEPENDENT_AMBULATORY_CARE_PROVIDER_SITE_OTHER): Payer: Medicare Other | Admitting: Adult Health

## 2019-02-18 ENCOUNTER — Encounter (HOSPITAL_COMMUNITY): Payer: Self-pay

## 2019-02-18 ENCOUNTER — Encounter: Payer: Self-pay | Admitting: Adult Health

## 2019-02-18 DIAGNOSIS — I10 Essential (primary) hypertension: Secondary | ICD-10-CM

## 2019-02-18 DIAGNOSIS — Z8679 Personal history of other diseases of the circulatory system: Secondary | ICD-10-CM

## 2019-02-18 DIAGNOSIS — R569 Unspecified convulsions: Secondary | ICD-10-CM | POA: Diagnosis not present

## 2019-02-18 DIAGNOSIS — E785 Hyperlipidemia, unspecified: Secondary | ICD-10-CM | POA: Diagnosis not present

## 2019-02-18 DIAGNOSIS — E119 Type 2 diabetes mellitus without complications: Secondary | ICD-10-CM | POA: Diagnosis not present

## 2019-02-18 MED ORDER — LEVETIRACETAM 1000 MG PO TABS: 1000.0000 mg | ORAL_TABLET | Freq: Two times a day (BID) | ORAL | 4 refills | Status: DC

## 2019-02-18 NOTE — Progress Notes (Signed)
[facility]  PATIENT: Allen Myers DOB: 08/14/1946   REASON FOR VISIT: Follow-up for subdural hematoma, essential hypertension, hyperlipidemia, and seizure disorder HISTORY FROM: patient   Virtual Visit via Video Note  I connected with [name] on 02/18/19 at 12:45 PM EDT by a video enabled telemedicine application located remotely in my own home and verified that I am speaking with the correct person using two identifiers who was located at their own home.   I discussed the limitations of evaluation and management by telemedicine and the availability of in person appointments. The patient expressed understanding and agreed to proceed   HPI  Allen Myers is a 73 y.o. male who continues to be followed in this office for history of subdural hematoma 03/2014 and seizure disorder.  He was initially scheduled today for annual office visit follow-up but due to COVID-19 safety cautions, visit transitioned to telemedicine via [email or web address] with patient's consent.  Has been stable. No additional seizure activity. Stable on keppra 1000mg  BID. Tolerating well.  Continues to stay active and has returned to driving without difficulty. BP stable - monitored at cardiac rehab. Typically 120s/70s. Continues on aspirin and lipitor. No further concerns at this time.     REVIEW OF SYSTEMS: Full 14 system review of systems performed and notable only for those  listed, all others are neg:   Seizures    ALLERGIES: No Known Allergies  HOME MEDICATIONS: Outpatient Medications Prior to Visit  Medication Sig Dispense Refill   Ascorbic Acid (VITAMIN C PO) Take 1 tablet by mouth.     aspirin EC 81 MG tablet Take 81 mg by mouth daily.     atorvastatin (LIPITOR) 80 MG tablet TAKE 1 TABLET DAILY 90 tablet 0   carvedilol (COREG) 3.125 MG tablet TAKE 1 TABLET TWICE DAILY  WITH MEALS 180 tablet 0   cilostazol (PLETAL) 100 MG tablet Take 1 tablet (100 mg total) by mouth 2 (two)  times daily. 60 tablet 11   levETIRAcetam (KEPPRA) 1000 MG tablet Take 1 tablet (1,000 mg total) by mouth 2 (two) times daily. 180 tablet 0   levothyroxine (SYNTHROID, LEVOTHROID) 150 MCG tablet Take 1 tablet (150 mcg total) by mouth daily. 90 tablet 0   linagliptin (TRADJENTA) 5 MG TABS tablet Take 1 tablet (5 mg total) by mouth daily. 90 tablet 1   lisinopril (PRINIVIL,ZESTRIL) 2.5 MG tablet Take 1 tablet (2.5 mg total) by mouth daily. 90 tablet 1   metFORMIN (GLUCOPHAGE) 1000 MG tablet TAKE 1 TABLET BY MOUTH TWICE A DAY WITH A MEAL 180 tablet 0   nitroGLYCERIN (NITROSTAT) 0.4 MG SL tablet PLACE 1 TABLET UNDER THE   TONGUE AND ALLOW TO        DISSOLVE EVERY 5 MINUTES ASNEEDED FOR CHEST PAIN 25 tablet 12   omeprazole (PRILOSEC) 40 MG capsule Take 1 capsule (40 mg total) by mouth daily. 90 capsule 1   ONE TOUCH ULTRA TEST test strip USE TO CHECK BLOOD SUGAR TWICE A DAY AS DIRECTED 100 each 0   pioglitazone (ACTOS) 45 MG tablet Take 1 tablet (45 mg total) by mouth daily. 90 tablet 3   tiZANidine (ZANAFLEX) 2 MG tablet Take 1 tablet (2 mg total) by mouth at bedtime as needed for muscle spasms. 30 tablet 5   No facility-administered medications prior to visit.     PAST MEDICAL HISTORY: Past Medical History:  Diagnosis Date   Allergy    Arthritis    CAD (coronary artery disease)  Erectile dysfunction    History of colon polyps    History of diverticulitis    HYPERTENSION    Hypothyroidism    Kidney stones    "passed it"   Peripheral arterial disease ([place])    SAH (subarachnoid hemorrhage) ([place])    Following syncopal episode   SDH (subdural hematoma) ([place])    Following syncopal episode   Seizures ([place])    08/2014 - controlled   Sleep apnea    "mild"  does not use CPAP   Sleep apnea in adult    Type II diabetes mellitus ([place])    type 2    PAST SURGICAL HISTORY: Past Surgical History:  Procedure Laterality Date   COLONOSCOPY  2014   polyp    CORONARY ANGIOPLASTY WITH STENT PLACEMENT  05/11/2014   "1"   CRANIOTOMY Left 09/12/2014   Procedure: CRANIOTOMY HEMATOMA EVACUATION SUBDURAL;  Surgeon: Erline Levine, MD;  Location: [facility];  Service: Neurosurgery;  Laterality: Left;   FRACTIONAL FLOW RESERVE WIRE  05/11/2014   Procedure: FRACTIONAL FLOW RESERVE WIRE;  Surgeon: [name], MD;  Location: [facility] CATH LAB;  Service: Cardiovascular;;   LEFT HEART CATHETERIZATION WITH CORONARY ANGIOGRAM N/A 05/11/2014   Procedure: LEFT HEART CATHETERIZATION WITH CORONARY ANGIOGRAM;  Surgeon: [name], MD;  Location: [facility] CATH LAB;  Service: Cardiovascular;  Laterality: N/A;   POLYPECTOMY      FAMILY HISTORY: Family History  Problem Relation Age of Onset   Lung cancer Father        dad was a smoker   Heart disease Father    AAA (abdominal aortic aneurysm) Father    Heart disease Mother    Ataxia Maternal Grandmother    Dementia Maternal Grandmother    Aplastic anemia Sister    Prostate cancer Brother    Lung cancer Cousin    Colon cancer Neg Hx    Rectal cancer Neg Hx    Stomach cancer Neg Hx    Colon polyps Neg Hx    Esophageal cancer Neg Hx     SOCIAL HISTORY: Social History   Socioeconomic History   Marital status: Married    Spouse name: Beverlee Nims   Number of children: 3   Years of education: 12   Highest education level: Not on file  Occupational History   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)    Employer: [employer]   Occupation: Civil Service fast streamer based in [place]   Occupation: [phone] (CELL)    Employer: [employer]  Social Needs   Financial resource strain: Not on file   Food insecurity:    Worry: Not on file    Inability: Not on file   Transportation needs:    Medical: Not on file    Non-medical: Not on file  Tobacco Use   Smoking status: Former Smoker    Packs/day: 1.00    Years: 30.00    Pack years: 30.00    Types: Cigarettes    Last attempt to quit:  10/15/1983    Years since quitting: 35.3   Smokeless tobacco: Never Used  Substance and Sexual Activity   Alcohol use: No    Alcohol/week: 0.0 standard drinks   Drug use: No   Sexual activity: Yes  Lifestyle   Physical activity:    Days per week: Not on file    Minutes per session: Not on file   Stress: Not on file  Relationships   Social connections:    Talks on phone: Not on  file    Gets together: Not on file    Attends religious service: Not on file    Active member of club or organization: Not on file    Attends meetings of clubs or organizations: Not on file    Relationship status: Not on file   Intimate partner violence:    Fear of current or ex partner: Not on file    Emotionally abused: Not on file    Physically abused: Not on file    Forced sexual activity: Not on file  Other Topics Concern   Not on file  Social History Narrative   Patient is married with 3 daughters 71, 32, 65 and 2 grandchildren.   Patient is right handed.   Patient has hs education.   Patient drinks decaf, and soda occasionally.     PHYSICAL EXAM  Generalized: Well developed, pleasant elderly Caucasian male, in no acute distress  Head: normocephalic and atraumatic,. Oropharynx benign   Neurological examination   Mentation: Alert oriented to time, place, history taking. Attention span and concentration appropriate. Recent and remote memory intact.  Follows all commands speech and language fluent.   Cranial nerve II-XII:  extraocular movements were full,  Facial sensation normal. hearing was intact to voice Uvula tongue midline. head turning and shoulder shrug were normal and symmetric. Motor: No evidence of weakness per drift assessment Sensory: normal and symmetric to light touch,  Coordination: finger-nose-finger symmetric,  no dysmetria Reflexes: UTA Gait and Station: Rising up from seated position without assistance, normal stance,  moderate stride, able to perform tiptoe, and  heel walking without difficulty. No assistive device  DIAGNOSTIC DATA (LABS, IMAGING, TESTING) - I reviewed patient records, labs, notes, testing and imaging myself where available.  Lab Results  Component Value Date   WBC 7.0 05/28/2018   HGB 14.1 05/28/2018   HCT 42.0 05/28/2018   MCV 87.5 05/28/2018   PLT 139.0 (L) 05/28/2018      Component Value Date/Time   NA 139 05/28/2018 1012   K 4.5 05/28/2018 1012   CL 104 05/28/2018 1012   CO2 28 05/28/2018 1012   GLUCOSE 139 (H) 05/28/2018 1012   BUN 19 05/28/2018 1012   CREATININE 0.94 05/28/2018 1012   CREATININE 0.89 11/14/2016 0917   CALCIUM 9.4 05/28/2018 1012   PROT 6.8 05/28/2018 1012   ALBUMIN 4.1 05/28/2018 1012   AST 16 05/28/2018 1012   ALT 17 05/28/2018 1012   ALKPHOS 103 05/28/2018 1012   BILITOT 0.7 05/28/2018 1012   GFRNONAA 86 (L) 09/06/2014 2029   GFRNONAA 74 06/16/2014 0808   GFRAA >90 09/06/2014 2029   GFRAA 86 06/16/2014 0808   Lab Results  Component Value Date   CHOL 90 05/28/2018   HDL 31.90 (L) 05/28/2018   LDLCALC 28 05/28/2018   LDLDIRECT 85.7 06/11/2013   TRIG 151.0 (H) 05/28/2018   CHOLHDL 3 05/28/2018   Lab Results  Component Value Date   HGBA1C 7.5 (A) 12/03/2018    Lab Results  Component Value Date   TSH 0.41 05/28/2018      ASSESSMENT AND PLAN 73 y.o. Caucasian male with PMH of CAD s/p stent in 04/2014 on ASA , right SDH s/p fall with seizure in 03/2014 put on keppra was admitted again in 08/2014 for left SDH s/p fall. Had slight enlargement of left SDH over days and frequent focal seizure with aphasia and right cheek numbness. Increased keppra dose to 1000mg  bid. Had SDH evacuation on 09/12/14. Discharged with ASA 81mg .  09/2014, repeat CT showed evolving left SDH. Followed up with cardiology and OK with only on baby ASA. No seizures after discharge.  He continues to be stable without recent seizure activity or recurring stroke/TIA symptoms.  Plan:  -continue ASA 81mg  and lipitor  for stroke and cardiac prevention. -continue keppra 1000mg  bid. For seizure disorder -patient brought up possibility of decreasing Keppra dosage and possibly weaning off medication.  Advised him that we could trial decreasing medication but he would have to stop driving for 6 months after decreasing medication.  As he continues to be active and continues to drive, he prefers to stay on current dose at this time as he is tolerating well without side effects -Continue to follow up with Dr. Jenny Reichmann for stroke risk factor modification.  -Recommend maintain blood pressure goal less than 130/90, diabetes with hemoglobin A1c goal below 7% and lipids with LDL cholesterol goal below 70 mg/dL. -Continue cardiac rehabilitation and [facility] at [facility]   He will return in 1 year for medication management or call earlier if needed   I spent 25 min  in total non-face to face time with the patient more than 50% of which was spent counseling and coordination of care, reviewing medications and discussing and reviewing the diagnosis of stroke and management of risk factors as well as seizure disorder and ongoing use of Keppra.  Venancio Poisson, AGNP-BC  [facility] [address] [place], [place] [address]  Phone [phone] Fax [phone] Note: This document was prepared with digital dictation and possible smart phrase technology. Any transcriptional errors that result from this process are unintentional.

## 2019-02-18 NOTE — Progress Notes (Signed)
I agree with the above plan 

## 2019-02-19 ENCOUNTER — Encounter (HOSPITAL_COMMUNITY): Payer: Self-pay

## 2019-02-23 ENCOUNTER — Ambulatory Visit: Payer: Medicare Other | Admitting: Nurse Practitioner

## 2019-02-23 ENCOUNTER — Ambulatory Visit: Payer: Medicare Other | Admitting: Adult Health

## 2019-02-25 ENCOUNTER — Encounter (HOSPITAL_COMMUNITY): Payer: Self-pay

## 2019-02-26 ENCOUNTER — Encounter (HOSPITAL_COMMUNITY): Payer: Self-pay

## 2019-03-02 ENCOUNTER — Telehealth: Payer: Self-pay | Admitting: Internal Medicine

## 2019-03-02 ENCOUNTER — Encounter (HOSPITAL_COMMUNITY): Payer: Self-pay

## 2019-03-02 MED ORDER — METFORMIN HCL 1000 MG PO TABS: ORAL_TABLET | ORAL | 1 refills | Status: DC

## 2019-03-02 NOTE — Telephone Encounter (Signed)
Copied from [place] [id number]. Topic: Quick Communication - Rx Refill/Question >> Mar 02, 2019  9:15 AM Virl Axe D wrote: Medication: metFORMIN (GLUCOPHAGE) 1000 MG tablet / Pt stated he has been having issue with refills and would like to have a phone call letting him know that refill has been sent to his pharmacy. Please advise.  Has the patient contacted their pharmacy? Yes.   (Agent: If no, request that the patient contact the pharmacy for the refill.) (Agent: If yes, when and what did the pharmacy advise?)  Preferred Pharmacy (with phone number or street name): [facility] [place], [place] to Registered [facility] [phone] (Phone) [phone] (Fax)    Agent: Please be advised that RX refills may take up to 3 business days. We ask that you follow-up with your pharmacy.

## 2019-03-04 ENCOUNTER — Encounter (HOSPITAL_COMMUNITY): Payer: Self-pay

## 2019-03-05 ENCOUNTER — Encounter (HOSPITAL_COMMUNITY): Payer: Self-pay

## 2019-03-09 ENCOUNTER — Encounter (HOSPITAL_COMMUNITY): Payer: Self-pay

## 2019-03-11 ENCOUNTER — Encounter (HOSPITAL_COMMUNITY): Payer: Self-pay

## 2019-03-12 ENCOUNTER — Encounter (HOSPITAL_COMMUNITY): Payer: Self-pay

## 2019-03-16 ENCOUNTER — Encounter (HOSPITAL_COMMUNITY): Payer: Self-pay

## 2019-03-18 ENCOUNTER — Encounter (HOSPITAL_COMMUNITY): Payer: Self-pay

## 2019-03-19 ENCOUNTER — Encounter (HOSPITAL_COMMUNITY): Payer: Self-pay

## 2019-03-23 ENCOUNTER — Encounter (HOSPITAL_COMMUNITY): Payer: Self-pay

## 2019-03-25 ENCOUNTER — Encounter (HOSPITAL_COMMUNITY): Payer: Self-pay

## 2019-03-26 ENCOUNTER — Encounter (HOSPITAL_COMMUNITY): Payer: Self-pay

## 2019-03-26 DIAGNOSIS — L570 Actinic keratosis: Secondary | ICD-10-CM | POA: Diagnosis not present

## 2019-03-26 DIAGNOSIS — Z85828 Personal history of other malignant neoplasm of skin: Secondary | ICD-10-CM | POA: Diagnosis not present

## 2019-03-30 ENCOUNTER — Encounter (HOSPITAL_COMMUNITY): Payer: Self-pay

## 2019-04-01 ENCOUNTER — Encounter (HOSPITAL_COMMUNITY): Payer: Self-pay

## 2019-04-02 ENCOUNTER — Encounter (HOSPITAL_COMMUNITY): Payer: Self-pay

## 2019-04-06 ENCOUNTER — Encounter (HOSPITAL_COMMUNITY): Payer: Self-pay

## 2019-04-08 ENCOUNTER — Telehealth (HOSPITAL_COMMUNITY): Payer: Self-pay | Admitting: *Deleted

## 2019-04-08 ENCOUNTER — Encounter (HOSPITAL_COMMUNITY): Payer: Self-pay

## 2019-04-08 DIAGNOSIS — Z20828 Contact with and (suspected) exposure to other viral communicable diseases: Secondary | ICD-10-CM | POA: Diagnosis not present

## 2019-04-08 NOTE — Telephone Encounter (Signed)
Called to update patient on the maintenance Cardiac Rehab exercise classes.  They remain on hold at this time due to COVID-19 pandemic precautions.

## 2019-04-09 ENCOUNTER — Encounter (HOSPITAL_COMMUNITY): Payer: Self-pay

## 2019-04-13 ENCOUNTER — Encounter (HOSPITAL_COMMUNITY): Payer: Self-pay

## 2019-04-15 ENCOUNTER — Encounter (HOSPITAL_COMMUNITY): Payer: Self-pay

## 2019-04-20 ENCOUNTER — Encounter (HOSPITAL_COMMUNITY): Payer: Self-pay

## 2019-04-22 ENCOUNTER — Encounter (HOSPITAL_COMMUNITY): Payer: Self-pay

## 2019-04-23 ENCOUNTER — Encounter (HOSPITAL_COMMUNITY): Payer: Self-pay

## 2019-04-27 ENCOUNTER — Encounter (HOSPITAL_COMMUNITY): Payer: Self-pay

## 2019-04-29 ENCOUNTER — Other Ambulatory Visit: Payer: Self-pay | Admitting: Internal Medicine

## 2019-04-29 ENCOUNTER — Encounter (HOSPITAL_COMMUNITY): Payer: Self-pay

## 2019-04-30 ENCOUNTER — Encounter (HOSPITAL_COMMUNITY): Payer: Self-pay

## 2019-05-04 ENCOUNTER — Encounter (HOSPITAL_COMMUNITY): Payer: Self-pay

## 2019-05-06 ENCOUNTER — Encounter (HOSPITAL_COMMUNITY): Payer: Self-pay

## 2019-05-07 ENCOUNTER — Encounter (HOSPITAL_COMMUNITY): Payer: Self-pay

## 2019-05-11 ENCOUNTER — Encounter (HOSPITAL_COMMUNITY): Payer: Self-pay

## 2019-05-13 ENCOUNTER — Encounter (HOSPITAL_COMMUNITY): Payer: Self-pay

## 2019-05-14 ENCOUNTER — Encounter (HOSPITAL_COMMUNITY): Payer: Self-pay

## 2019-05-18 ENCOUNTER — Encounter (HOSPITAL_COMMUNITY): Payer: Self-pay

## 2019-05-20 ENCOUNTER — Encounter (HOSPITAL_COMMUNITY): Payer: Self-pay

## 2019-05-21 ENCOUNTER — Encounter (HOSPITAL_COMMUNITY): Payer: Self-pay

## 2019-05-25 ENCOUNTER — Encounter (HOSPITAL_COMMUNITY): Payer: Self-pay

## 2019-05-27 ENCOUNTER — Encounter (HOSPITAL_COMMUNITY): Payer: Self-pay

## 2019-05-28 ENCOUNTER — Encounter (HOSPITAL_COMMUNITY): Payer: Self-pay

## 2019-06-01 ENCOUNTER — Encounter (HOSPITAL_COMMUNITY): Payer: Self-pay

## 2019-06-03 ENCOUNTER — Encounter (HOSPITAL_COMMUNITY): Payer: Self-pay

## 2019-06-04 ENCOUNTER — Emergency Department (HOSPITAL_COMMUNITY)
Admission: EM | Admit: 2019-06-04 | Discharge: 2019-06-05 | Disposition: A | Discharge status: Home / Self Care | Payer: Medicare Other | Attending: Emergency Medicine | Admitting: Emergency Medicine

## 2019-06-04 ENCOUNTER — Encounter (HOSPITAL_COMMUNITY): Payer: Self-pay

## 2019-06-04 ENCOUNTER — Emergency Department (HOSPITAL_COMMUNITY): Payer: Medicare Other

## 2019-06-04 DIAGNOSIS — Z79899 Other long term (current) drug therapy: Secondary | ICD-10-CM | POA: Diagnosis not present

## 2019-06-04 DIAGNOSIS — Z7982 Long term (current) use of aspirin: Secondary | ICD-10-CM | POA: Diagnosis not present

## 2019-06-04 DIAGNOSIS — R072 Precordial pain: Secondary | ICD-10-CM | POA: Diagnosis not present

## 2019-06-04 DIAGNOSIS — E119 Type 2 diabetes mellitus without complications: Secondary | ICD-10-CM | POA: Insufficient documentation

## 2019-06-04 DIAGNOSIS — I10 Essential (primary) hypertension: Secondary | ICD-10-CM | POA: Diagnosis not present

## 2019-06-04 DIAGNOSIS — Z87891 Personal history of nicotine dependence: Secondary | ICD-10-CM | POA: Diagnosis not present

## 2019-06-04 DIAGNOSIS — E039 Hypothyroidism, unspecified: Secondary | ICD-10-CM | POA: Insufficient documentation

## 2019-06-04 DIAGNOSIS — R0602 Shortness of breath: Secondary | ICD-10-CM | POA: Diagnosis not present

## 2019-06-04 DIAGNOSIS — I2510 Atherosclerotic heart disease of native coronary artery without angina pectoris: Secondary | ICD-10-CM | POA: Diagnosis not present

## 2019-06-04 DIAGNOSIS — R0789 Other chest pain: Secondary | ICD-10-CM | POA: Diagnosis present

## 2019-06-04 DIAGNOSIS — Z7984 Long term (current) use of oral hypoglycemic drugs: Secondary | ICD-10-CM | POA: Diagnosis not present

## 2019-06-04 DIAGNOSIS — R079 Chest pain, unspecified: Secondary | ICD-10-CM

## 2019-06-04 LAB — CBC
HCT: 44.5 % (ref 39.0–52.0)
Hemoglobin: 14.6 g/dL (ref 13.0–17.0)
MCH: 29.6 pg (ref 26.0–34.0)
MCHC: 32.8 g/dL (ref 30.0–36.0)
MCV: 90.3 fL (ref 80.0–100.0)
Platelets: 160 10*3/uL (ref 150–400)
RBC: 4.93 MIL/uL (ref 4.22–5.81)
RDW: 14.6 % (ref 11.5–15.5)
WBC: 7.1 10*3/uL (ref 4.0–10.5)
nRBC: 0 % (ref 0.0–0.2)

## 2019-06-04 LAB — TROPONIN I (HIGH SENSITIVITY)
Troponin I (High Sensitivity): 4 ng/L (ref <18)
Troponin I (High Sensitivity): 4 ng/L (ref <18)

## 2019-06-04 LAB — BASIC METABOLIC PANEL
Anion gap: 11 (ref 5–15)
BUN: 16 mg/dL (ref 8–23)
CO2: 22 mmol/L (ref 22–32)
Calcium: 9.2 mg/dL (ref 8.9–10.3)
Chloride: 104 mmol/L (ref 98–111)
Creatinine, Ser: 0.97 mg/dL (ref 0.61–1.24)
GFR calc Af Amer: >60 mL/min (ref >60)
GFR calc non Af Amer: >60 mL/min (ref >60)
Glucose, Bld: 134 mg/dL — ABNORMAL HIGH (ref 70–99)
Potassium: 4.8 mmol/L (ref 3.5–5.1)
Sodium: 137 mmol/L (ref 135–145)

## 2019-06-04 MED ORDER — SODIUM CHLORIDE 0.9% FLUSH: 3.0000 mL | Freq: Once | INTRAVENOUS | Status: DC

## 2019-06-04 NOTE — ED Triage Notes (Signed)
Pt states he has had chest tightness for roughly 1 week. Also c/o pain in the middle of his back that started today. Pt endorses increased weakness. Pt has mild cough, noted in triage. Pt is alert and oriented x4. Pt states his BP has been elevated today. Hx of heart stents.

## 2019-06-05 NOTE — ED Notes (Signed)
Patient verbalized understanding of discharge instructions. Opportunity for questions were provided. Pt. ambulatory and discharged home.  

## 2019-06-05 NOTE — Discharge Instructions (Addendum)
Follow up with Dr. Stanford Breed in the next few days, and return to the ER if you develop worsening pain, high fever, difficulty breathing, or other new and concerning symptoms.  Continue medications as previously prescribed.

## 2019-06-05 NOTE — ED Provider Notes (Signed)
[place] EMERGENCY DEPARTMENT Provider Note   CSN: [id number] Arrival date & time: 06/04/19  1817     History   Chief Complaint Chief Complaint  Patient presents with  . Chest Pain    HPI Allen Myers is a 73 y.o. male.     Patient is a 73 year old male with history of CAD with stent, DM, Subarachnoid hemorrhage related to trauma.  He presents today with complaints of tightness in his chest that has been occurring intermittently for the past week.  These symptoms come and go and are not associated with any nausea or diaphoresis.  He denies any fevers, chills, or cough.  He does have a history of CAD with stent placed approximately four years ago after a syncopal episode with fall which also caused his brain bleed.   The history is provided by the patient.  Chest Pain Pain location:  Substernal area Pain quality: tightness   Pain radiates to:  Upper back Pain severity:  Moderate Timing:  Intermittent Chronicity:  New   Past Medical History:  Diagnosis Date  . Allergy   . Arthritis   . CAD (coronary artery disease)   . Erectile dysfunction   . History of colon polyps   . History of diverticulitis   . HYPERTENSION   . Hypothyroidism   . Kidney stones    "passed it"  . Peripheral arterial disease ([place])   . SAH (subarachnoid hemorrhage) ([place])    Following syncopal episode  . SDH (subdural hematoma) ([facility])    Following syncopal episode  . Seizures ([place])    08/2014 - controlled  . Sleep apnea    "mild"  does not use CPAP  . Sleep apnea in adult   . Type II diabetes mellitus ([place])    type 2    Patient Active Problem List   Diagnosis Date Noted  . Degenerative arthritis of knee, bilateral 06/16/2018  . Claudication ([place]) 05/28/2018  . History of subdural hematoma 02/10/2018  . RTI (respiratory tract infection) 01/20/2018  . Seasonal allergic rhinitis due to pollen 01/20/2018  . Rotator cuff syndrome of right shoulder 03/04/2016  .  Avulsion of right elbow 03/04/2016  . Right lateral epicondylitis 03/04/2016  . Left wrist pain 10/18/2015  . Skin tear of elbow without complication [id number]  . Abrasions of multiple sites 10/18/2015  . Fall at home 10/18/2015  . Subdural hematoma ([place]) 04/24/2015  . HLD (hyperlipidemia) 04/24/2015  . Preventative health care 03/29/2015  . Bilateral shoulder pain 03/29/2015  . Abdominal aortic aneurysm ([place]) 12/30/2014  . Congestive dilated cardiomyopathy ([place]) 12/30/2014  . Essential hypertension 11/23/2014  . Hyperlipidemia 10/04/2014  . SDH (subdural hematoma) ([place]) 09/06/2014  . Coronary atherosclerosis of native coronary artery 05/11/2014  . Obesity (BMI 30.0-34.9) 04/04/2014  . Seizure ([place]) 04/02/2014  . Syncope 04/02/2014  . SAH (subarachnoid hemorrhage) ([place]) 04/01/2014  . OSA (obstructive sleep apnea) 07/22/2013  . Hypertensive heart disease   . History of kidney stones   . Cough 11/03/2007  . Diabetes mellitus type 2, noninsulin dependent ([place])   . Hypothyroidism 05/07/2007    Past Surgical History:  Procedure Laterality Date  . COLONOSCOPY  2014   polyp  . CORONARY ANGIOPLASTY WITH STENT PLACEMENT  05/11/2014   "1"  . CRANIOTOMY Left 09/12/2014   Procedure: CRANIOTOMY HEMATOMA EVACUATION SUBDURAL;  Surgeon: Erline Levine, MD;  Location: [facility];  Service: Neurosurgery;  Laterality: Left;  . FRACTIONAL FLOW RESERVE WIRE  05/11/2014   Procedure:  FRACTIONAL FLOW RESERVE WIRE;  Surgeon: [name], MD;  Location: [facility] CATH LAB;  Service: Cardiovascular;;  . LEFT HEART CATHETERIZATION WITH CORONARY ANGIOGRAM N/A 05/11/2014   Procedure: LEFT HEART CATHETERIZATION WITH CORONARY ANGIOGRAM;  Surgeon: [name], MD;  Location: [facility] C[facility] LAB;  Service: Cardiovascular;  Laterality: N/A;  . POLYPECTOMY          Home Medications    Prior to Admission medications   Medication Sig Start Date End Date Taking? Authorizing Provider  Ascorbic Acid (VITAMIN C  PO) Take 1 tablet by mouth.    [provider]  aspirin EC 81 MG tablet Take 81 mg by mouth daily.    [provider]  atorvastatin (LIPITOR) 80 MG tablet TAKE 1 TABLET DAILY 04/29/19   Biagio Borg, MD  carvedilol (COREG) 3.125 MG tablet TAKE 1 TABLET TWICE DAILY  WITH MEALS 04/29/19   Biagio Borg, MD  cilostazol (PLETAL) 100 MG tablet Take 1 tablet (100 mg total) by mouth 2 (two) times daily. 06/23/18   Marty Heck, MD  levETIRAcetam (KEPPRA) 1000 MG tablet Take 1 tablet (1,000 mg total) by mouth 2 (two) times daily. 02/18/19   Claris Gower, NP  levothyroxine (SYNTHROID) 150 MCG tablet TAKE 1 TABLET DAILY 04/29/19   Biagio Borg, MD  linagliptin (TRADJENTA) 5 MG TABS tablet Take 1 tablet (5 mg total) by mouth daily. 01/26/19   Biagio Borg, MD  lisinopril (PRINIVIL,ZESTRIL) 2.5 MG tablet Take 1 tablet (2.5 mg total) by mouth daily. 01/26/19   Biagio Borg, MD  metFORMIN (GLUCOPHAGE) 1000 MG tablet TAKE 1 TABLET BY MOUTH TWICE A DAY WITH A MEAL 03/02/19   Biagio Borg, MD  nitroGLYCERIN (NITROSTAT) 0.4 MG SL tablet PLACE 1 TABLET UNDER THE   TONGUE AND ALLOW TO        DISSOLVE EVERY 5 MINUTES ASNEEDED FOR CHEST PAIN 05/04/18   Biagio Borg, MD  omeprazole (PRILOSEC) 40 MG capsule Take 1 capsule (40 mg total) by mouth daily. 01/26/19   Biagio Borg, MD  ONE TOUCH ULTRA TEST test strip USE TO CHECK BLOOD SUGAR TWICE A DAY AS DIRECTED 10/23/16   Biagio Borg, MD  pioglitazone (ACTOS) 45 MG tablet Take 1 tablet (45 mg total) by mouth daily. 10/21/18   Biagio Borg, MD  tiZANidine (ZANAFLEX) 2 MG tablet Take 1 tablet (2 mg total) by mouth at bedtime as needed for muscle spasms. 04/27/18   Biagio Borg, MD    Family History Family History  Problem Relation Age of Onset  . Lung cancer Father        dad was a smoker  . Heart disease Father   . AAA (abdominal aortic aneurysm) Father   . Heart disease Mother   . Ataxia Maternal Grandmother   . Dementia Maternal Grandmother    . Aplastic anemia Sister   . Prostate cancer Brother   . Lung cancer Cousin   . Colon cancer Neg Hx   . Rectal cancer Neg Hx   . Stomach cancer Neg Hx   . Colon polyps Neg Hx   . Esophageal cancer Neg Hx     Social History Social History   Tobacco Use  . Smoking status: Former Smoker    Packs/day: 1.00    Years: 30.00    Pack years: 30.00    Types: Cigarettes    Quit date: 10/15/1983    Years since quitting: 35.6  .  Smokeless tobacco: Never Used  Substance Use Topics  . Alcohol use: No    Alcohol/week: 0.0 standard drinks  . Drug use: No     Allergies   Patient has no known allergies.   Review of Systems Review of Systems  Cardiovascular: Positive for chest pain.  All other systems reviewed and are negative.    Physical Exam Updated Vital Signs BP 136/71 (BP Location: Right Arm)   Pulse 77   Temp (!) 97.3 F (36.3 C) (Oral)   Resp 14   Ht 6\' 1"  (1.854 m)   Wt 115.7 kg   SpO2 95%   BMI 33.64 kg/m   Physical Exam Vitals signs and nursing note reviewed.  Constitutional:      General: He is not in acute distress.    Appearance: He is well-developed. He is not diaphoretic.  HENT:     Head: Normocephalic and atraumatic.  Neck:     Musculoskeletal: Normal range of motion and neck supple.  Cardiovascular:     Rate and Rhythm: Normal rate and regular rhythm.     Heart sounds: No murmur. No friction rub.  Pulmonary:     Effort: Pulmonary effort is normal. No respiratory distress.     Breath sounds: Normal breath sounds. No wheezing or rales.  Abdominal:     General: Bowel sounds are normal. There is no distension.     Palpations: Abdomen is soft.     Tenderness: There is no abdominal tenderness.  Musculoskeletal: Normal range of motion.     Right lower leg: He exhibits no tenderness. No edema.     Left lower leg: He exhibits no tenderness. No edema.  Skin:    General: Skin is warm and dry.  Neurological:     Mental Status: He is alert and oriented  to person, place, and time.     Coordination: Coordination normal.      ED Treatments / Results  Labs (all labs ordered are listed, but only abnormal results are displayed) Labs Reviewed  BASIC METABOLIC PANEL - Abnormal; Notable for the following components:      Result Value   Glucose, Bld 134 (*)    All other components within normal limits  CBC  TROPONIN I (HIGH SENSITIVITY)  TROPONIN I (HIGH SENSITIVITY)    EKG EKG Interpretation  Date/Time:  Friday June 04 2019 18:29:22 EDT Ventricular Rate:  85 PR Interval:  158 QRS Duration: 86 QT Interval:  360 QTC Calculation: 428 R Axis:   -24 Text Interpretation:  Normal sinus rhythm Normal ECG Confirmed by Veryl Speak [phone]) on 06/05/2019 1:46:01 AM   Radiology Dg Chest 2 View  Result Date: 06/04/2019 CLINICAL DATA:  Chest tightness. Shortness of breath. EXAM: CHEST - 2 VIEW COMPARISON:  Radiograph 01/20/2018, chest CT 01/16/2016 FINDINGS: The cardiomediastinal contours are normal. Biapical pleuroparenchymal thickening is unchanged. Mild interstitial coarsening likely secondary to emphysema seen on prior CT. Pulmonary vasculature is normal. No consolidation, pleural effusion, or pneumothorax. No acute osseous abnormalities are seen. IMPRESSION: No acute chest findings. Unchanged radiographic appearance of the chest from 01/20/2018. Electronically Signed   By: Keith Rake M.D.   On: 06/04/2019 19:15    Procedures Procedures (including critical care time)  Medications Ordered in ED Medications  sodium chloride flush (NS) 0.9 % injection 3 mL (has no administration in time range)     Initial Impression / Assessment and Plan / ED Course  I have reviewed the triage vital signs and the nursing notes.  Pertinent labs & imaging results that were available during my care of the patient were reviewed by me and considered in my medical decision making (see chart for details).  Patient with history of CAD presenting with  chest discomfort, the etiology of which I am uncertain.  It does not appear cardiac in nature as his troponin (high sensitivity) is negative times two and ekg is unchanged from 2017.  Vitals are stable and he appears in no distress.  He does report an extremely stressful event in his life and this may have something to do with his symptoms.  Disposition discussed and patient prefers to go home and follow up with Dr. Stanford Breed this week.  I am comfortable with this disposition and patient understands to return if symptoms worsen or change.    Final Clinical Impressions(s) / ED Diagnoses   Final diagnoses:  None    ED Discharge Orders    None       Veryl Speak, MD 06/05/19 [phone]

## 2019-06-08 ENCOUNTER — Encounter (HOSPITAL_COMMUNITY): Payer: Self-pay

## 2019-06-09 ENCOUNTER — Other Ambulatory Visit: Payer: Self-pay

## 2019-06-09 ENCOUNTER — Ambulatory Visit (INDEPENDENT_AMBULATORY_CARE_PROVIDER_SITE_OTHER): Payer: Medicare Other | Admitting: Internal Medicine

## 2019-06-09 ENCOUNTER — Encounter (HOSPITAL_COMMUNITY): Payer: Self-pay | Admitting: Internal Medicine

## 2019-06-09 ENCOUNTER — Encounter: Payer: Self-pay | Admitting: Internal Medicine

## 2019-06-09 VITALS — BP 136/84 | HR 83 | Temp 97.6°F | Ht 73.0 in | Wt 258.0 lb

## 2019-06-09 DIAGNOSIS — E785 Hyperlipidemia, unspecified: Secondary | ICD-10-CM

## 2019-06-09 DIAGNOSIS — Z23 Encounter for immunization: Secondary | ICD-10-CM

## 2019-06-09 DIAGNOSIS — I2510 Atherosclerotic heart disease of native coronary artery without angina pectoris: Secondary | ICD-10-CM

## 2019-06-09 DIAGNOSIS — R079 Chest pain, unspecified: Secondary | ICD-10-CM | POA: Diagnosis not present

## 2019-06-09 DIAGNOSIS — E119 Type 2 diabetes mellitus without complications: Secondary | ICD-10-CM | POA: Diagnosis not present

## 2019-06-09 DIAGNOSIS — I10 Essential (primary) hypertension: Secondary | ICD-10-CM

## 2019-06-09 LAB — POCT GLYCOSYLATED HEMOGLOBIN (HGB A1C): Hemoglobin A1C: 7.3 % — AB (ref 4.0–5.6)

## 2019-06-09 NOTE — Progress Notes (Signed)
Subjective:    Patient ID: Allen Myers, male    DOB: [birth year], 73 y.o.   MRN: [id number]  HPI  Here to fu ED visit aug 21 with CP c/w tightness he has also had several times in the past month, has had some anxiety to attributed it to that, but then agu 21 tightness had some radiation to the baak; BP at home abou 160 90 is mod high and unusual for him, so went to ED.  Troponins neg x 2, ecg NSR without ischemic changes, BP improved about 110, cxr neg for acute.  Tightness did come and go while there, no sob, diaphoresis, n/v, palps or dizziness or other radiation. Pt denies new neurological symptoms such as new headache, or facial or extremity weakness or numbness   Pt denies polydipsia, polyuria, or low sugar symptoms such as weakness or confusion improved with po intake.  Pt states overall good compliance with meds, trying to follow lower cholesterol, diabetic diet. Past Medical History:  Diagnosis Date  . Allergy   . Arthritis   . CAD (coronary artery disease)   . Erectile dysfunction   . History of colon polyps   . History of diverticulitis   . HYPERTENSION   . Hypothyroidism   . Kidney stones    "passed it"  . Peripheral arterial disease ([place])   . SAH (subarachnoid hemorrhage) ([place])    Following syncopal episode  . SDH (subdural hematoma) ([facility])    Following syncopal episode  . Seizures ([place])    08/2014 - controlled  . Sleep apnea    "mild"  does not use CPAP  . Sleep apnea in adult   . Type II diabetes mellitus ([place])    type 2   Past Surgical History:  Procedure Laterality Date  . COLONOSCOPY  2014   polyp  . CORONARY ANGIOPLASTY WITH STENT PLACEMENT  05/11/2014   "1"  . CRANIOTOMY Left 09/12/2014   Procedure: CRANIOTOMY HEMATOMA EVACUATION SUBDURAL;  Surgeon: Erline Levine, MD;  Location: [place] [facility] NEURO ORS;  Service: Neurosurgery;  Laterality: Left;  . FRACTIONAL FLOW RESERVE WIRE  05/11/2014   Procedure: FRACTIONAL FLOW RESERVE WIRE;  Surgeon: [name], MD;   Location: [facility] CATH LAB;  Service: Cardiovascular;;  . LEFT HEART CATHETERIZATION WITH CORONARY ANGIOGRAM N/A 05/11/2014   Procedure: LEFT HEART CATHETERIZATION WITH CORONARY ANGIOGRAM;  Surgeon: [name], MD;  Location: [facility] CATH LAB;  Service: Cardiovascular;  Laterality: N/A;  . POLYPECTOMY      reports that he quit smoking about 35 years ago. His smoking use included cigarettes. He has a 30.00 pack-year smoking history. He has never used smokeless tobacco. He reports that he does not drink alcohol or use drugs. family history includes AAA (abdominal aortic aneurysm) in his father; Aplastic anemia in his sister; Ataxia in his maternal grandmother; Dementia in his maternal grandmother; Heart disease in his father and mother; Lung cancer in his cousin and father; Prostate cancer in his brother. No Known Allergies Current Outpatient Medications on File Prior to Visit  Medication Sig Dispense Refill  . Ascorbic Acid (VITAMIN C PO) Take 1 tablet by mouth.    Marland Kitchen aspirin EC 81 MG tablet Take 81 mg by mouth daily.    Marland Kitchen atorvastatin (LIPITOR) 80 MG tablet TAKE 1 TABLET DAILY 90 tablet 0  . carvedilol (COREG) 3.125 MG tablet TAKE 1 TABLET TWICE DAILY  WITH MEALS 180 tablet 0  . cilostazol (PLETAL) 100 MG tablet Take 1 tablet (  100 mg total) by mouth 2 (two) times daily. 60 tablet 11  . levETIRAcetam (KEPPRA) 1000 MG tablet Take 1 tablet (1,000 mg total) by mouth 2 (two) times daily. 180 tablet 4  . levothyroxine (SYNTHROID) 150 MCG tablet TAKE 1 TABLET DAILY 90 tablet 0  . linagliptin (TRADJENTA) 5 MG TABS tablet Take 1 tablet (5 mg total) by mouth daily. 90 tablet 1  . lisinopril (PRINIVIL,ZESTRIL) 2.5 MG tablet Take 1 tablet (2.5 mg total) by mouth daily. 90 tablet 1  . metFORMIN (GLUCOPHAGE) 1000 MG tablet TAKE 1 TABLET BY MOUTH TWICE A DAY WITH A MEAL 180 tablet 1  . nitroGLYCERIN (NITROSTAT) 0.4 MG SL tablet PLACE 1 TABLET UNDER THE   TONGUE AND ALLOW TO        DISSOLVE EVERY 5 MINUTES ASNEEDED  FOR CHEST PAIN 25 tablet 12  . omeprazole (PRILOSEC) 40 MG capsule Take 1 capsule (40 mg total) by mouth daily. 90 capsule 1  . ONE TOUCH ULTRA TEST test strip USE TO CHECK BLOOD SUGAR TWICE A DAY AS DIRECTED 100 each 0  . pioglitazone (ACTOS) 45 MG tablet Take 1 tablet (45 mg total) by mouth daily. 90 tablet 3  . tiZANidine (ZANAFLEX) 2 MG tablet Take 1 tablet (2 mg total) by mouth at bedtime as needed for muscle spasms. 30 tablet 5   No current facility-administered medications on file prior to visit.    Review of Systems  Constitutional: Negative for other unusual diaphoresis or sweats HENT: Negative for ear discharge or swelling Eyes: Negative for other worsening visual disturbances Respiratory: Negative for stridor or other swelling  Gastrointestinal: Negative for worsening distension or other blood Genitourinary: Negative for retention or other urinary change Musculoskeletal: Negative for other MSK pain or swelling Skin: Negative for color change or other new lesions Neurological: Negative for worsening tremors and other numbness  Psychiatric/Behavioral: Negative for worsening agitation or other fatigue All other system neg per pt    Objective:   Physical Exam BP 136/84   Pulse 83   Temp 97.6 F (36.4 C) (Oral)   Ht 6\' 1"  (1.854 m)   Wt 258 lb (117 kg)   SpO2 95%   BMI 34.04 kg/m  VS noted,  Constitutional: Pt appears in NAD HENT: Head: NCAT.  Right Ear: External ear normal.  Left Ear: External ear normal.  Eyes: . Pupils are equal, round, and reactive to light. Conjunctivae and EOM are normal Nose: without d/c or deformity Neck: Neck supple. Gross normal ROM Cardiovascular: Normal rate and regular rhythm.   Pulmonary/Chest: Effort normal and breath sounds without rales or wheezing.  Abd:  Soft, NT, ND, + BS, no organomegaly Neurological: Pt is alert. At baseline orientation, motor grossly intact Skin: Skin is warm. No rashes, other new lesions, no LE edema  Psychiatric: Pt behavior is normal without agitation  No other exam findings Lab Results  Component Value Date   WBC 7.1 06/04/2019   HGB 14.6 06/04/2019   HCT 44.5 06/04/2019   PLT 160 06/04/2019   GLUCOSE 134 (H) 06/04/2019   CHOL 90 05/28/2018   TRIG 151.0 (H) 05/28/2018   HDL 31.90 (L) 05/28/2018   LDLDIRECT 85.7 06/11/2013   LDLCALC 28 05/28/2018   ALT 17 05/28/2018   AST 16 05/28/2018   NA 137 06/04/2019   K 4.8 06/04/2019   CL 104 06/04/2019   CREATININE 0.97 06/04/2019   BUN 16 06/04/2019   CO2 22 06/04/2019   TSH 0.41 05/28/2018   PSA  2.35 05/28/2018   INR 1.07 09/06/2014   HGBA1C 7.5 (A) 12/03/2018   MICROALBUR 1.0 01/28/2017  POCT glycosylated hemoglobin (Hb A1C) Order: [id number] Status:  Final result Visible to patient:  No (not released) Dx:  Diabetes mellitus type 2, noninsulin ...  Ref Range & Units 10:32 (06/09/19) 54mo ago (12/03/18) 71mo ago (06/16/18) 66yr ago (11/18/17) 4yr ago (05/16/17) 59yr ago (01/28/17) 26yr ago (08/24/15)  Hemoglobin A1C 4.0 - 5.6 % 7.3Abnormal   7.5Abnormal   7.1Abnormal   7.5High  R, CM  7.1High  R, CM  7.4High  R, CM  6.5              Assessment & Plan:

## 2019-06-09 NOTE — Patient Instructions (Signed)
Your A1c was OK today  You will be contacted regarding the referral for: stress test (which can be canceled per cardiology if needed)  You will be contacted regarding the referral for: Diabetes Education  Please call if you change your mind about trying a medication for anxiety such as celexa 10 mg per day  Please continue all other medications as before, and refills have been done if requested.  Please have the pharmacy call with any other refills you may need.  Please continue your efforts at being more active, low cholesterol diet, and weight control.  Please keep your appointments with your specialists as you may have planned  Please return in 6 months, or sooner if needed

## 2019-06-10 ENCOUNTER — Encounter (HOSPITAL_COMMUNITY): Payer: Self-pay

## 2019-06-11 ENCOUNTER — Encounter (HOSPITAL_COMMUNITY): Payer: Self-pay

## 2019-06-12 ENCOUNTER — Encounter: Payer: Self-pay | Admitting: Internal Medicine

## 2019-06-12 NOTE — Assessment & Plan Note (Signed)
stable overall by history and exam, recent data reviewed with pt, and pt to continue medical treatment as before,  to f/u any worsening symptoms or concerns  

## 2019-06-12 NOTE — Assessment & Plan Note (Addendum)
Atypical, cont same tx but for stress test,  to f/u any worsening symptoms or concerns  Note:  Total time for pt hx, exam, review of record with pt in the room, determination of diagnoses and plan for further eval and tx is > 40 min, with over 50% spent in coordination and counseling of patient including the differential dx, tx, further evaluation and other management of chest pain, HTN, HLD, DM

## 2019-06-12 NOTE — Assessment & Plan Note (Addendum)
stable overall by history and exam, recent data reviewed with pt, and pt to continue medical treatment as before,  to f/u any worsening symptoms or concerns, for referral DM education

## 2019-06-15 ENCOUNTER — Telehealth (HOSPITAL_COMMUNITY): Payer: Self-pay

## 2019-06-15 NOTE — Telephone Encounter (Signed)
Encounter complete. 

## 2019-06-16 ENCOUNTER — Ambulatory Visit: Payer: Medicare Other | Admitting: Cardiology

## 2019-06-17 ENCOUNTER — Ambulatory Visit (HOSPITAL_COMMUNITY)
Admission: RE | Admit: 2019-06-17 | Discharge: 2019-06-17 | Disposition: A | Discharge status: Home / Self Care | Payer: Medicare Other | Source: Ambulatory Visit | Attending: Cardiovascular Disease | Admitting: Cardiovascular Disease

## 2019-06-17 ENCOUNTER — Other Ambulatory Visit: Payer: Self-pay

## 2019-06-17 DIAGNOSIS — R079 Chest pain, unspecified: Secondary | ICD-10-CM | POA: Insufficient documentation

## 2019-06-17 MED ORDER — TECHNETIUM TC 99M TETROFOSMIN IV KIT
32.2000 | PACK | Freq: Once | INTRAVENOUS | Status: AC | PRN
  Administered 2019-06-17: 32.2 via INTRAVENOUS
  Filled 2019-06-17: qty 33

## 2019-06-17 MED ORDER — ADENOSINE (DIAGNOSTIC) 3 MG/ML IV SOLN
0.5600 mg/kg | Freq: Once | INTRAVENOUS | Status: AC
  Administered 2019-06-17: 65.4 mg via INTRAVENOUS

## 2019-06-17 MED ORDER — REGADENOSON 0.4 MG/5ML IV SOLN: 0.4000 mg | Freq: Once | INTRAVENOUS | Status: DC

## 2019-06-17 MED ORDER — TECHNETIUM TC 99M TETROFOSMIN IV KIT
10.9000 | PACK | Freq: Once | INTRAVENOUS | Status: AC | PRN
  Administered 2019-06-17: 10.9 via INTRAVENOUS
  Filled 2019-06-17: qty 11

## 2019-06-18 ENCOUNTER — Other Ambulatory Visit: Payer: Self-pay | Admitting: Internal Medicine

## 2019-06-18 LAB — MYOCARDIAL PERFUSION IMAGING
LV dias vol: 124 mL (ref 62–150)
LV sys vol: 56 mL
Peak HR: 95 {beats}/min
Rest HR: 68 {beats}/min
SDS: 2
SRS: 2
SSS: 4
TID: 1.53

## 2019-06-18 MED ORDER — CILOSTAZOL 100 MG PO TABS: 100.0000 mg | ORAL_TABLET | Freq: Two times a day (BID) | ORAL | 2 refills | Status: DC

## 2019-06-18 NOTE — Telephone Encounter (Signed)
Routing to CMA 

## 2019-06-18 NOTE — Telephone Encounter (Signed)
Requested medication (s) are due for refill today: yes  Requested medication (s) are on the active medication list: yes    Future visit scheduled: no  Notes to clinic: review for refill pcp is different than ordering provider   Requested Prescriptions  Pending Prescriptions Disp Refills   cilostazol (PLETAL) 100 MG tablet 60 tablet 11    Sig: Take 1 tablet (100 mg total) by mouth 2 (two) times daily.     Hematology: Antiplatelets - cilostazol Passed - 06/18/2019 10:08 AM      Passed - HCT in normal range and within 180 days    HCT  Date Value Ref Range Status  06/04/2019 44.5 39.0 - 52.0 % Final         Passed - HGB in normal range and within 180 days    Hemoglobin  Date Value Ref Range Status  06/04/2019 14.6 13.0 - 17.0 g/dL Final         Passed - PLT in normal range and within 180 days    Platelets  Date Value Ref Range Status  06/04/2019 160 150 - 400 K/uL Final         Passed - WBC in normal range and within 180 days    WBC  Date Value Ref Range Status  06/04/2019 7.1 4.0 - 10.5 K/uL Final         Passed - Valid encounter within last 6 months    Recent Outpatient Visits          1 week ago Chest pain, unspecified type   [place], MD   6 months ago Hypothyroidism, unspecified type   [place], James W, MD   11 months ago Claudication [facility])   [place], [place], DO   1 year ago Lumbar radiculopathy   [place], [place], DO   1 year ago [facility])   [facility] -Georges Mouse, MD      Future Appointments            In 2 weeks [place], Doreene Burke, PA-C [place], Gail   In 5 months Jenny Reichmann, Hunt Oris, MD [place], [facility]

## 2019-06-18 NOTE — Telephone Encounter (Signed)
Medication Refill - Medication:  cilostazol (PLETAL) 100 MG tablet   Has the patient contacted their pharmacy? Yes advised to call office. Patient requesting 90 day supply.   Preferred Pharmacy (with phone number or street name):  [facility] [place], [place] to [facility] [phone] (Phone) [phone] (Fax)   Agent: Please be advised that RX refills may take up to 3 business days. We ask that you follow-up with your pharmacy.

## 2019-06-30 ENCOUNTER — Other Ambulatory Visit: Payer: Self-pay

## 2019-06-30 ENCOUNTER — Encounter: Payer: Medicare Other | Attending: Internal Medicine | Admitting: *Deleted

## 2019-06-30 DIAGNOSIS — E119 Type 2 diabetes mellitus without complications: Secondary | ICD-10-CM | POA: Insufficient documentation

## 2019-06-30 NOTE — Patient Instructions (Signed)
Plan:  Aim for 3 Carb Choices per meal (45 grams) +/- 1 either way  Aim for 0-2 Carbs per snack if hungry  Include protein in moderation with your meals and snacks Consider reading food labels for Total Carbohydrate of foods Consider  increasing your activity level by walking or gym exercises for 15-30 minutes daily as tolerated Consider getting the [place] Sensor to be able to check Blood Glucose daily  Continue taking medication as directed by MD

## 2019-07-02 NOTE — Progress Notes (Signed)
Diabetes Self-Management Education  Visit Type: First/Initial  Appt. Start Time: 0800 Appt. End Time: 0930  07/02/2019  [name], identified by name and date of birth, is a 73 y.o. male with a diagnosis of Diabetes: Type 2. Patient here with his wife who participated in the visit too. He has had diabetes since 1990 but would like review on meal planning today. He is not checking his blood sugars, does not see the need for it.  ASSESSMENT  There were no vitals taken for this visit. There is no height or weight on file to calculate BMI.  Diabetes Self-Management Education - 06/30/19 0813      Visit Information   Visit Type  First/Initial      Initial Visit   Diabetes Type  Type 2    Are you currently following a meal plan?  No    Are you taking your medications as prescribed?  Yes    Date Diagnosed  1990      Health Coping   How would you rate your overall health?  Fair      Psychosocial Assessment   Patient Belief/Attitude about Diabetes  Defeat/Burnout    Self-care barriers  None    Other persons present  Patient;Spouse/SO    Patient Concerns  Nutrition/Meal planning    Special Needs  None    Preferred Learning Style  Auditory;Visual;Hands on    [place] in progress    How often do you need to have someone help you when you read instructions, pamphlets, or other written materials from your doctor or pharmacy?  1 - Never    What is the last grade level you completed in school?  12      Pre-Education Assessment   Patient understands the diabetes disease and treatment process.  Needs Review    Patient understands incorporating nutritional management into lifestyle.  Needs Instruction    Patient undertands incorporating physical activity into lifestyle.  Needs Review    Patient understands using medications safely.  Needs Instruction    Patient understands monitoring blood glucose, interpreting and using results  Needs Review    Patient understands  prevention, detection, and treatment of acute complications.  Needs Review    Patient understands prevention, detection, and treatment of chronic complications.  Needs Review    Patient understands how to develop strategies to address psychosocial issues.  Needs Review    Patient understands how to develop strategies to promote health/change behavior.  Needs Review      Complications   Last HgB A1C per patient/outside source  7.3 %    How often do you check your blood sugar?  0 times/day (not testing)    Have you had a dilated eye exam in the past 12 months?  Yes    Have you had a dental exam in the past 12 months?  Yes    Are you checking your feet?  No      Dietary Intake   Breakfast  1030: eggs and toast OR sandwich OR left overs    Dinner  5:30 ish: meat, starch, vegetables OR casserole like spaghetti, used to eat out 2 nights a week. more fast food lately for take out    Snack (evening)  usually - pretzels, sometimes cookies, popcorn    Beverage(s)  decaf coffee with Sweetener and milk, milk, water occasionally      Exercise   Exercise Type  ADL's    How many days per week  to you exercise?  3    How many minutes per day do you exercise?  30    Total minutes per week of exercise  90      Patient Education   Previous Diabetes Education  Yes (please comment)   2000   Disease state   Definition of diabetes, type 1 and 2, and the diagnosis of diabetes;Factors that contribute to the development of diabetes;Explored patient's options for treatment of their diabetes    Nutrition management   Role of diet in the treatment of diabetes and the relationship between the three main macronutrients and blood glucose level;Carbohydrate counting;Reviewed blood glucose goals for pre and post meals and how to evaluate the patients' food intake on their blood glucose level.    Physical activity and exercise   Role of exercise on diabetes management, blood pressure control and cardiac health.;Helped  patient identify appropriate exercises in relation to his/her diabetes, diabetes complications and other health issue.    Medications  Reviewed patients medication for diabetes, action, purpose, timing of dose and side effects.    Monitoring  Identified appropriate SMBG and/or A1C goals.;Purpose and frequency of SMBG.   Informed patient of Libre CGM as he dislikes pricking his fingers     Individualized Goals (developed by patient)   Nutrition  Follow meal plan discussed    Physical Activity  15 minutes per day    Medications  take my medication as prescribed    Monitoring   test blood glucose pre and post meals as discussed;Other (comment)   consider getting Rx for Jamal Maes in place of BG meter     Post-Education Assessment   Patient understands the diabetes disease and treatment process.  Demonstrates understanding / competency    Patient understands incorporating nutritional management into lifestyle.  Demonstrates understanding / competency    Patient undertands incorporating physical activity into lifestyle.  Demonstrates understanding / competency    Patient understands using medications safely.  Demonstrates understanding / competency    Patient understands monitoring blood glucose, interpreting and using results  Demonstrates understanding / competency    Patient understands prevention, detection, and treatment of acute complications.  Demonstrates understanding / competency    Patient understands prevention, detection, and treatment of chronic complications.  Demonstrates understanding / competency    Patient understands how to develop strategies to address psychosocial issues.  Demonstrates understanding / competency    Patient understands how to develop strategies to promote health/change behavior.  Demonstrates understanding / competency      Outcomes   Expected Outcomes  Demonstrated interest in learning. Expect positive outcomes    Future DMSE  PRN    Program Status  Not  Completed       Individualized Plan for Diabetes Self-Management Training:   Learning Objective:  Patient will have a greater understanding of diabetes self-management. Patient education plan is to attend individual and/or group sessions per assessed needs and concerns.   Plan:   Patient Instructions  Plan:  Aim for 3 Carb Choices per meal (45 grams) +/- 1 either way  Aim for 0-2 Carbs per snack if hungry  Include protein in moderation with your meals and snacks Consider reading food labels for Total Carbohydrate of foods Consider  increasing your activity level by walking or gym exercises for 15-30 minutes daily as tolerated Consider getting the [place] Sensor to be able to check Blood Glucose daily  Continue taking medication as directed by MD  Expected Outcomes:  Demonstrated interest in  learning. Expect positive outcomes  Education material provided: Food label handouts, A1C conversion sheet, Meal plan card and Carbohydrate counting sheet, [employer] Libre brochure  If problems or questions, patient to contact team via:  Phone  Future DSME appointment: PRN

## 2019-07-05 ENCOUNTER — Ambulatory Visit (INDEPENDENT_AMBULATORY_CARE_PROVIDER_SITE_OTHER): Payer: Medicare Other | Admitting: General Practice

## 2019-07-05 ENCOUNTER — Encounter: Payer: Self-pay | Admitting: Cardiology

## 2019-07-05 ENCOUNTER — Other Ambulatory Visit: Payer: Self-pay

## 2019-07-05 VITALS — BP 122/60 | HR 80 | Ht 73.0 in | Wt 263.0 lb

## 2019-07-05 DIAGNOSIS — I714 Abdominal aortic aneurysm, without rupture, unspecified: Secondary | ICD-10-CM

## 2019-07-05 DIAGNOSIS — I429 Cardiomyopathy, unspecified: Secondary | ICD-10-CM

## 2019-07-05 DIAGNOSIS — I10 Essential (primary) hypertension: Secondary | ICD-10-CM | POA: Diagnosis not present

## 2019-07-05 DIAGNOSIS — I2510 Atherosclerotic heart disease of native coronary artery without angina pectoris: Secondary | ICD-10-CM

## 2019-07-05 DIAGNOSIS — E7800 Pure hypercholesterolemia, unspecified: Secondary | ICD-10-CM | POA: Diagnosis not present
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
## 2019-07-05 NOTE — Patient Instructions (Addendum)
Medication Instructions:  Your physician recommends that you continue on your current medications as directed. Please refer to the Current Medication list given to you today. If you need a refill on your cardiac medications before your next appointment, please call your pharmacy.   Lab work: None  If you have labs (blood work) drawn today and your tests are completely normal, you will receive your results only by: Marland Kitchen MyChart Message (if you have MyChart) OR . A paper copy in the mail If you have any lab test that is abnormal or we need to change your treatment, we will call you to review the results.  Testing/Procedures: None   Follow-Up: At [facility], you and your health needs are our priority.  As part of our continuing mission to provide you with exceptional heart care, we have created designated Provider Care Teams.  These Care Teams include your primary Cardiologist (physician) and Advanced Practice Providers (APPs -  Physician Assistants and Nurse Practitioners) who all work together to provide you with the care you need, when you need it. You will need a follow up appointment in 6 months.  Please call our office 2 months in advance to schedule this appointment.  You may see Kirk Ruths, MD or one of the following Advanced Practice Providers on your designated Care Team:   Kerin Ransom, PA-C Roby Lofts, [place] . Sande Rives, PA-C  Any Other Special Instructions Will Be Listed Below (If Applicable).    Mindfulness-Based Stress Reduction Mindfulness-based stress reduction (MBSR) is a program that helps people learn to practice mindfulness. Mindfulness is the practice of intentionally paying attention to the present moment. It can be learned and practiced through techniques such as education, breathing exercises, meditation, and yoga. MBSR includes several mindfulness techniques in one program. MBSR works best when you understand the treatment, are willing to try new things,  and can commit to spending time practicing what you learn. MBSR training may include learning about:  How your emotions, thoughts, and reactions affect your body.  New ways to respond to things that cause negative thoughts to start (triggers).  How to notice your thoughts and let go of them.  Practicing awareness of everyday things that you normally do without thinking.  The techniques and goals of different types of meditation. What are the benefits of MBSR? MBSR can have many benefits, which include helping you to:  Develop self-awareness. This refers to knowing and understanding yourself.  Learn skills and attitudes that help you to participate in your own health care.  Learn new ways to care for yourself.  Be more accepting about how things are, and let things go.  Be less judgmental and approach things with an open mind.  Be patient with yourself and trust yourself more. MBSR has also been shown to:  Reduce negative emotions, such as depression and anxiety.  Improve memory and focus.  Change how you sense and approach pain.  Boost your body's ability to fight infections.  Help you connect better with other people.  Improve your sense of well-being. Follow these instructions at home:   Find a local in-person or online MBSR program.  Set aside some time regularly for mindfulness practice.  Find a mindfulness practice that works best for you. This may include one or more of the following: ? Meditation. Meditation involves focusing your mind on a certain thought or activity. ? Breathing awareness exercises. These help you to stay present by focusing on your breath. ? Body scan. For  this practice, you lie down and pay attention to each part of your body from head to toe. You can identify tension and soreness and intentionally relax parts of your body. ? Yoga. Yoga involves stretching and breathing, and it can improve your ability to move and be flexible. It can also  provide an experience of testing your body's limits, which can help you release stress. ? Mindful eating. This way of eating involves focusing on the taste, texture, color, and smell of each bite of food. Because this slows down eating and helps you feel full sooner, it can be an important part of a weight-loss plan.  Find a podcast or recording that provides guidance for breathing awareness, body scan, or meditation exercises. You can listen to these any time when you have a free moment to rest without distractions.  Follow your treatment plan as told by your health care provider. This may include taking regular medicines and making changes to your diet or lifestyle as recommended. How to practice mindfulness To do a basic awareness exercise:  Find a comfortable place to sit.  Pay attention to the present moment. Observe your thoughts, feelings, and surroundings just as they are.  Avoid placing judgment on yourself, your feelings, or your surroundings. Make note of any judgment that comes up, and let it go.  Your mind may wander, and that is okay. Make note of when your thoughts drift, and return your attention to the present moment. To do basic mindfulness meditation:  Find a comfortable place to sit. This may include a stable chair or a firm floor cushion. ? Sit upright with your back straight. Let your arms fall next to your side with your hands resting on your legs. ? If sitting in a chair, rest your feet flat on the floor. ? If sitting on a cushion, cross your legs in front of you.  Keep your head in a neutral position with your chin dropped slightly. Relax your jaw and rest the tip of your tongue on the roof of your mouth. Drop your gaze to the floor. You can close your eyes if you like.  Breathe normally and pay attention to your breath. Feel the air moving in and out of your nose. Feel your belly expanding and relaxing with each breath.  Your mind may wander, and that is okay.  Make note of when your thoughts drift, and return your attention to your breath.  Avoid placing judgment on yourself, your feelings, or your surroundings. Make note of any judgment or feelings that come up, let them go, and bring your attention back to your breath.  When you are ready, lift your gaze or open your eyes. Pay attention to how your body feels after the meditation. Where to find more information You can find more information about MBSR from:  Your health care provider.  Community-based meditation centers or programs.  Programs offered near you. Summary  Mindfulness-based stress reduction (MBSR) is a program that teaches you how to intentionally pay attention to the present moment. It is used with other treatments to help you cope better with daily stress, emotions, and pain.  MBSR focuses on developing self-awareness, which allows you to respond to life stress without judgment or negative emotions.  MBSR programs may involve learning different mindfulness practices, such as breathing exercises, meditation, yoga, body scan, or mindful eating. Find a mindfulness practice that works best for you, and set aside time for it on a regular basis. This information is  not intended to replace advice given to you by your health care provider. Make sure you discuss any questions you have with your health care provider. Document Released: 02/06/2017 Document Revised: 09/12/2017 Document Reviewed: 02/06/2017 Elsevier Patient Education  2020 [employer].

## 2019-07-14 ENCOUNTER — Encounter: Payer: Self-pay | Admitting: Internal Medicine

## 2019-07-15 ENCOUNTER — Ambulatory Visit (INDEPENDENT_AMBULATORY_CARE_PROVIDER_SITE_OTHER): Payer: Medicare Other | Admitting: Internal Medicine

## 2019-07-15 ENCOUNTER — Encounter: Payer: Self-pay | Admitting: Internal Medicine

## 2019-07-15 ENCOUNTER — Other Ambulatory Visit: Payer: Self-pay

## 2019-07-15 DIAGNOSIS — J069 Acute upper respiratory infection, unspecified: Secondary | ICD-10-CM | POA: Diagnosis not present

## 2019-07-15 DIAGNOSIS — E039 Hypothyroidism, unspecified: Secondary | ICD-10-CM

## 2019-07-15 DIAGNOSIS — E119 Type 2 diabetes mellitus without complications: Secondary | ICD-10-CM | POA: Diagnosis not present

## 2019-07-15 DIAGNOSIS — I10 Essential (primary) hypertension: Secondary | ICD-10-CM

## 2019-07-15 DIAGNOSIS — I2510 Atherosclerotic heart disease of native coronary artery without angina pectoris: Secondary | ICD-10-CM | POA: Diagnosis not present

## 2019-07-15 DIAGNOSIS — Z20828 Contact with and (suspected) exposure to other viral communicable diseases: Secondary | ICD-10-CM

## 2019-07-15 DIAGNOSIS — Z20822 Contact with and (suspected) exposure to covid-19: Secondary | ICD-10-CM

## 2019-07-15 MED ORDER — HYDROCODONE-HOMATROPINE 5-1.5 MG/5ML PO SYRP: 5.0000 mL | ORAL_SOLUTION | Freq: Four times a day (QID) | ORAL | 0 refills | Status: AC | PRN

## 2019-07-15 MED ORDER — AZITHROMYCIN 250 MG PO TABS: ORAL_TABLET | ORAL | 1 refills | Status: DC

## 2019-07-15 NOTE — Assessment & Plan Note (Signed)
stable overall by history and exam, recent data reviewed with pt, and pt to continue medical treatment as before,  to f/u any worsening symptoms or concerns  

## 2019-07-15 NOTE — Assessment & Plan Note (Signed)
Mild to mod, c/w viral vs bacterial infection and cant r/o covid, for antibx course, cough med prn, and refer for COVID testing,  to f/u any worsening symptoms or concerns

## 2019-07-15 NOTE — Patient Instructions (Signed)
Please take all new medication as prescribed  Please continue all other medications as before, and refills have been done if requested.  Please have the pharmacy call with any other refills you may need.  Please continue your efforts at being more active, low cholesterol diet, and weight control.  Please keep your appointments with your specialists as you may have planned  Please go for COVID testing tomorrow

## 2019-07-15 NOTE — Telephone Encounter (Signed)
Please ask if pt able to do virtual - thanks

## 2019-07-15 NOTE — Progress Notes (Signed)
Patient ID: Allen Myers, male   DOB: October 14, 1946, 73 y.o.   MRN: [id number]  Virtual Visit via Video Note  I connected with Allen Myers on 07/15/19 at  3:20 PM EDT by a video enabled telemedicine application and verified that I am speaking with the correct person using two identifiers.  Location: Patient: at home Provider: at office   I discussed the limitations of evaluation and management by telemedicine and the availability of in person appointments. The patient expressed understanding and agreed to proceed.  History of Present Illness:  Here with 2-3 days acute onset fever, facial pain, pressure, headache, general weakness and malaise, and greenish d/c, with mild ST and cough, but pt denies chest pain, wheezing, increased sob or doe, orthopnea, PND, increased LE swelling, palpitations, dizziness or syncope.  Taste is ok, but smell is gone.  Symptoms started soon after returned home from visiting ill family members in [place].  Pt denies new neurological symptoms such as new headache, or facial or extremity weakness or numbness   Pt denies polydipsia, polyuria, Denies hyper or hypo thyroid symptoms such as voice, skin or hair change.  BP at home < 140/90 Past Medical History:  Diagnosis Date  . Allergy   . Arthritis   . CAD (coronary artery disease)   . Erectile dysfunction   . History of colon polyps   . History of diverticulitis   . HYPERTENSION   . Hypothyroidism   . Kidney stones    "passed it"  . Peripheral arterial disease ([place])   . SAH (subarachnoid hemorrhage) ([place])    Following syncopal episode  . SDH (subdural hematoma) ([facility])    Following syncopal episode  . Seizures ([place])    08/2014 - controlled  . Sleep apnea    "mild"  does not use CPAP  . Sleep apnea in adult   . Type II diabetes mellitus ([place])    type 2   Past Surgical History:  Procedure Laterality Date  . COLONOSCOPY  2014   polyp  . CORONARY ANGIOPLASTY WITH STENT PLACEMENT  05/11/2014   "1"  .  CRANIOTOMY Left 09/12/2014   Procedure: CRANIOTOMY HEMATOMA EVACUATION SUBDURAL;  Surgeon: Erline Levine, MD;  Location: [facility] OR[facility];  Service: Neurosurgery;  Laterality: Left;  . FRACTIONAL FLOW RESERVE WIRE  05/11/2014   Procedure: FRACTIONAL FLOW RESERVE WIRE;  Surgeon: [name], MD;  Location: [facility] CATH LAB;  Service: Cardiovascular;;  . LEFT HEART CATHETERIZATION WITH CORONARY ANGIOGRAM N/A 05/11/2014   Procedure: LEFT HEART CATHETERIZATION WITH CORONARY ANGIOGRAM;  Surgeon: [name], MD;  Location: [facility] CATH LAB;  Service: Cardiovascular;  Laterality: N/A;  . POLYPECTOMY      reports that he quit smoking about 35 years ago. His smoking use included cigarettes. He has a 30.00 pack-year smoking history. He has never used smokeless tobacco. He reports that he does not drink alcohol or use drugs. family history includes AAA (abdominal aortic aneurysm) in his father; Aplastic anemia in his sister; Ataxia in his maternal grandmother; Dementia in his maternal grandmother; Heart disease in his father and mother; Lung cancer in his cousin and father; Prostate cancer in his brother. No Known Allergies Current Outpatient Medications on File Prior to Visit  Medication Sig Dispense Refill  . Ascorbic Acid (VITAMIN C PO) Take 1 tablet by mouth.    Marland Kitchen aspirin EC 81 MG tablet Take 81 mg by mouth daily.    Marland Kitchen atorvastatin (LIPITOR) 80 MG tablet TAKE 1  TABLET DAILY 90 tablet 0  . carvedilol (COREG) 3.125 MG tablet TAKE 1 TABLET TWICE DAILY  WITH MEALS 180 tablet 0  . cilostazol (PLETAL) 100 MG tablet Take 1 tablet (100 mg total) by mouth 2 (two) times daily. 60 tablet 2  . levETIRAcetam (KEPPRA) 1000 MG tablet Take 1 tablet (1,000 mg total) by mouth 2 (two) times daily. 180 tablet 4  . levothyroxine (SYNTHROID) 150 MCG tablet TAKE 1 TABLET DAILY 90 tablet 0  . linagliptin (TRADJENTA) 5 MG TABS tablet Take 1 tablet (5 mg total) by mouth daily. 90 tablet 1  . lisinopril (PRINIVIL,ZESTRIL) 2.5 MG  tablet Take 1 tablet (2.5 mg total) by mouth daily. 90 tablet 1  . metFORMIN (GLUCOPHAGE) 1000 MG tablet TAKE 1 TABLET BY MOUTH TWICE A DAY WITH A MEAL 180 tablet 1  . nitroGLYCERIN (NITROSTAT) 0.4 MG SL tablet PLACE 1 TABLET UNDER THE   TONGUE AND ALLOW TO        DISSOLVE EVERY 5 MINUTES ASNEEDED FOR CHEST PAIN 25 tablet 12  . omeprazole (PRILOSEC) 40 MG capsule Take 1 capsule (40 mg total) by mouth daily. 90 capsule 1  . ONE TOUCH ULTRA TEST test strip USE TO CHECK BLOOD SUGAR TWICE A DAY AS DIRECTED 100 each 0  . pioglitazone (ACTOS) 45 MG tablet Take 1 tablet (45 mg total) by mouth daily. 90 tablet 3   No current facility-administered medications on file prior to visit.    Observations/Objective: Alert, NAD, appropriate mood and affect, resps normal, cn 2-12 intact, moves all 4s, no visible rash or swelling Lab Results  Component Value Date   WBC 7.1 06/04/2019   HGB 14.6 06/04/2019   HCT 44.5 06/04/2019   PLT 160 06/04/2019   GLUCOSE 134 (H) 06/04/2019   CHOL 90 05/28/2018   TRIG 151.0 (H) 05/28/2018   HDL 31.90 (L) 05/28/2018   LDLDIRECT 85.7 06/11/2013   LDLCALC 28 05/28/2018   ALT 17 05/28/2018   AST 16 05/28/2018   NA 137 06/04/2019   K 4.8 06/04/2019   CL 104 06/04/2019   CREATININE 0.97 06/04/2019   BUN 16 06/04/2019   CO2 22 06/04/2019   TSH 0.41 05/28/2018   PSA 2.35 05/28/2018   INR 1.07 09/06/2014   HGBA1C 7.3 (A) 06/09/2019   MICROALBUR 1.0 01/28/2017   Assessment and Plan: See notes  Follow Up Instructions: See notes   I discussed the assessment and treatment plan with the patient. The patient was provided an opportunity to ask questions and all were answered. The patient agreed with the plan and demonstrated an understanding of the instructions.   The patient was advised to call back or seek an in-person evaluation if the symptoms worsen or if the condition fails to improve as anticipated.   Cathlean Cower, MD

## 2019-07-16 ENCOUNTER — Other Ambulatory Visit: Payer: Self-pay

## 2019-07-16 DIAGNOSIS — Z20822 Contact with and (suspected) exposure to covid-19: Secondary | ICD-10-CM

## 2019-07-17 LAB — NOVEL CORONAVIRUS, NAA: SARS-CoV-2, NAA: NOT DETECTED

## 2019-07-19 ENCOUNTER — Telehealth: Payer: Self-pay

## 2019-07-19 NOTE — Telephone Encounter (Signed)
Pt has viewed results via MyChart  

## 2019-07-19 NOTE — Telephone Encounter (Signed)
-----   Message from Biagio Borg, MD sent at 07/17/2019 11:19 AM EDT ----- Left message on MyChart, pt to cont same tx   Shailey Butterbaugh to please inform pt, COVID is neg

## 2019-07-22 ENCOUNTER — Other Ambulatory Visit: Payer: Self-pay | Admitting: Internal Medicine

## 2019-07-22 MED ORDER — LINAGLIPTIN 5 MG PO TABS: 5.0000 mg | ORAL_TABLET | Freq: Every day | ORAL | 1 refills | Status: DC

## 2019-07-22 MED ORDER — LEVOTHYROXINE SODIUM 150 MCG PO TABS: 150.0000 ug | ORAL_TABLET | Freq: Every day | ORAL | 0 refills | Status: DC

## 2019-07-22 NOTE — Telephone Encounter (Signed)
Pt request refill  linagliptin (TRADJENTA) 5 MG TABS tablet  levothyroxine (SYNTHROID) 150 MCG tablet  [facility] [place] [place] [facility] [employer] [phone] (Phone) [phone] (Fax)

## 2019-07-29 ENCOUNTER — Other Ambulatory Visit: Payer: Self-pay | Admitting: Internal Medicine

## 2019-08-07 ENCOUNTER — Encounter: Payer: Self-pay | Admitting: Internal Medicine

## 2019-08-09 MED ORDER — OMEPRAZOLE 40 MG PO CPDR: 40.0000 mg | DELAYED_RELEASE_CAPSULE | Freq: Every day | ORAL | 1 refills | Status: DC

## 2019-08-09 MED ORDER — CARVEDILOL 3.125 MG PO TABS: 3.1250 mg | ORAL_TABLET | Freq: Two times a day (BID) | ORAL | 1 refills | Status: DC

## 2019-08-09 MED ORDER — LISINOPRIL 2.5 MG PO TABS: 2.5000 mg | ORAL_TABLET | Freq: Every day | ORAL | 1 refills | Status: DC

## 2019-09-02 ENCOUNTER — Other Ambulatory Visit: Payer: Self-pay | Admitting: Internal Medicine

## 2019-09-11 ENCOUNTER — Encounter: Payer: Self-pay | Admitting: Internal Medicine

## 2019-09-13 ENCOUNTER — Telehealth: Payer: Self-pay | Admitting: Internal Medicine

## 2019-09-13 MED ORDER — METFORMIN HCL 1000 MG PO TABS: ORAL_TABLET | ORAL | 1 refills | Status: DC

## 2019-09-13 NOTE — Telephone Encounter (Signed)
Rx filled earlier this morning.

## 2019-09-13 NOTE — Telephone Encounter (Signed)
RX REFILL metFORMIN (GLUCOPHAGE) 1000 MG tablet    [id number] [address], [place] [phone] (Phone) [phone] (Fax)

## 2019-09-17 ENCOUNTER — Encounter: Payer: Self-pay | Admitting: Internal Medicine

## 2019-09-17 MED ORDER — HYDROCODONE-HOMATROPINE 5-1.5 MG/5ML PO SYRP: 5.0000 mL | ORAL_SOLUTION | Freq: Four times a day (QID) | ORAL | 0 refills | Status: AC | PRN

## 2019-09-17 NOTE — Telephone Encounter (Signed)
Ok done to [address]

## 2019-09-20 ENCOUNTER — Other Ambulatory Visit: Payer: Self-pay

## 2019-09-20 ENCOUNTER — Encounter: Payer: Self-pay | Admitting: Internal Medicine

## 2019-09-20 DIAGNOSIS — Z20822 Contact with and (suspected) exposure to covid-19: Secondary | ICD-10-CM

## 2019-09-20 DIAGNOSIS — Z20828 Contact with and (suspected) exposure to other viral communicable diseases: Secondary | ICD-10-CM | POA: Diagnosis not present

## 2019-09-22 LAB — NOVEL CORONAVIRUS, NAA: SARS-CoV-2, NAA: DETECTED — AB

## 2019-09-24 ENCOUNTER — Telehealth: Payer: Self-pay | Admitting: Nurse Practitioner

## 2019-09-24 NOTE — Telephone Encounter (Signed)
Called to Discuss with patient about Covid symptoms and the use of bamlanivimab, a monoclonal antibody infusion for those with mild to moderate Covid symptoms and at a high risk of hospitalization.     Pt is qualified for this infusion at the [facility] infusion center due to co-morbid conditions and/or a member of an at-risk group.     Patient states that symptoms started over days ago.

## 2019-09-28 ENCOUNTER — Other Ambulatory Visit: Payer: Self-pay | Admitting: Internal Medicine

## 2019-10-07 ENCOUNTER — Other Ambulatory Visit: Payer: Self-pay | Admitting: Internal Medicine

## 2019-10-10 NOTE — Telephone Encounter (Signed)
Per routine office policy  All routine meds ok for 1 yr, then month to month only for 3 mo until refused after, thanks 

## 2019-10-14 ENCOUNTER — Other Ambulatory Visit: Payer: Self-pay | Admitting: Internal Medicine

## 2019-10-14 NOTE — Telephone Encounter (Signed)
Please refill as per office policy

## 2019-10-20 ENCOUNTER — Other Ambulatory Visit: Payer: Self-pay | Admitting: Internal Medicine

## 2019-10-20 NOTE — Telephone Encounter (Signed)
Please refill as per office routine med refill policy (all routine meds refilled for 3 mo or monthly per pt preference up to one year from last visit, then month to month grace period for 3 mo, then further med refills will have to be denied)  

## 2019-10-20 NOTE — Telephone Encounter (Signed)
Medication Refill - Medication:  linagliptin (TRADJENTA) 5 MG TABS tablet levothyroxine (SYNTHROID) 150 MCG tablet  carvedilol (COREG) 3.125 MG tablet  Has the patient contacted their pharmacy? Yes.   (Agent: If no, request that the patient contact the pharmacy for the refill.) (Agent: If yes, when and what did the pharmacy advise?)  Preferred Pharmacy (with phone number or street name):  [facility] [place] [place] [facility] [place] [address]  Phone: [phone] Fax: [phone]     Agent: Please be advised that RX refills may take up to 3 business days. We ask that you follow-up with your pharmacy.

## 2019-10-20 NOTE — Telephone Encounter (Signed)
Requested medication (s) are due for refill today: yes  Requested medication (s) are on the active medication list: yes   Future visit scheduled: yes  Notes to clinic:  Patient has appointment on 12/10/2019 Please advise    Requested Prescriptions  Pending Prescriptions Disp Refills   linagliptin (TRADJENTA) 5 MG TABS tablet 90 tablet 1    Sig: Take 1 tablet (5 mg total) by mouth daily.      Endocrinology:  Diabetes - DPP-4 Inhibitors - linagliptin Passed - 10/20/2019  1:55 PM      Passed - HBA1C is between 0 and 7.9 and within 180 days    Hemoglobin A1C  Date Value Ref Range Status  06/09/2019 7.3 (A) 4.0 - 5.6 % Final   Hgb A1c MFr Bld  Date Value Ref Range Status  11/18/2017 7.5 (H) 4.6 - 6.5 % Final    Comment:    Glycemic Control Guidelines for People with Diabetes:Non Diabetic:  <6%Goal of Therapy: <7%Additional Action Suggested:  >8%           Passed - Valid encounter within last 6 months    Recent Outpatient Visits           3 months ago Exposure to COVID-19 virus   [place], Hunt Oris, MD   4 months ago Chest pain, unspecified type   [place], James W, MD   10 months ago Hypothyroidism, unspecified type   [place], James W, MD   1 year ago Claudication [facility])   [place], [place], DO   1 year ago Lumbar radiculopathy   [place], [place], DO       Future Appointments             In 1 month Biagio Borg, MD [place] at [facility]   In 2 months Stanford Breed, Denice Bors, MD [facility]              levothyroxine (SYNTHROID) 150 MCG tablet 90 tablet 0    Sig: Take 1 tablet (150 mcg total) by mouth daily. Annual appt due in February must see provider for future refills      Endocrinology:  Hypothyroid Agents Failed - 10/20/2019  1:55 PM      Failed - TSH needs  to be rechecked within 3 months after an abnormal result. Refill until TSH is due.      Failed - TSH in normal range and within 360 days    TSH  Date Value Ref Range Status  05/28/2018 0.41 0.35 - 4.50 uIU/mL Final          Passed - Valid encounter within last 12 months    Recent Outpatient Visits           3 months ago Exposure to COVID-19 virus   [employer] [place], Hunt Oris, MD   4 months ago Chest pain, unspecified type   [place], MD   10 months ago Hypothyroidism, unspecified type   [place], James W, MD   1 year ago Claudication [facility])   [place], [place], DO   1 year ago Lumbar radiculopathy   [place], [place], DO       Future Appointments  In 1 month Biagio Borg, MD [place] at [facility]   In 2 months Stanford Breed, Denice Bors, MD [facility]              carvedilol (COREG) 3.125 MG tablet 180 tablet 1    Sig: Take 1 tablet (3.125 mg total) by mouth 2 (two) times daily with a meal.      Cardiovascular:  Beta Blockers Passed - 10/20/2019  1:55 PM      Passed - Last BP in normal range    BP Readings from Last 1 Encounters:  07/05/19 122/60          Passed - Last Heart Rate in normal range    Pulse Readings from Last 1 Encounters:  07/05/19 80          Passed - Valid encounter within last 6 months    Recent Outpatient Visits           3 months ago Exposure to COVID-19 virus   [place], Hunt Oris, MD   4 months ago Chest pain, unspecified type   [place], MD   10 months ago Hypothyroidism, unspecified type   [place], James W, MD   1 year ago Claudication [facility])   [place], Olevia Bowens, DO   1 year  ago Lumbar radiculopathy   [place], [place], DO       Future Appointments             In 1 month Jenny Reichmann, Hunt Oris, MD [place] at [facility]   In 2 months [place], Denice Bors, MD [facility] [place], [facility]

## 2019-10-21 MED ORDER — CARVEDILOL 3.125 MG PO TABS: 3.1250 mg | ORAL_TABLET | Freq: Two times a day (BID) | ORAL | 1 refills | Status: DC

## 2019-10-21 MED ORDER — LINAGLIPTIN 5 MG PO TABS: 5.0000 mg | ORAL_TABLET | Freq: Every day | ORAL | 1 refills | Status: DC

## 2019-10-21 MED ORDER — LEVOTHYROXINE SODIUM 150 MCG PO TABS: 150.0000 ug | ORAL_TABLET | Freq: Every day | ORAL | 0 refills | Status: DC

## 2019-10-21 NOTE — Telephone Encounter (Signed)
Duplicate request. Refill sent. See meds.

## 2019-10-27 ENCOUNTER — Encounter: Payer: Self-pay | Admitting: Internal Medicine

## 2019-10-27 DIAGNOSIS — L570 Actinic keratosis: Secondary | ICD-10-CM | POA: Diagnosis not present

## 2019-10-27 DIAGNOSIS — D485 Neoplasm of uncertain behavior of skin: Secondary | ICD-10-CM | POA: Diagnosis not present

## 2019-10-27 DIAGNOSIS — D0361 Melanoma in situ of right upper limb, including shoulder: Secondary | ICD-10-CM | POA: Diagnosis not present

## 2019-10-27 DIAGNOSIS — D692 Other nonthrombocytopenic purpura: Secondary | ICD-10-CM | POA: Diagnosis not present

## 2019-10-27 DIAGNOSIS — D1801 Hemangioma of skin and subcutaneous tissue: Secondary | ICD-10-CM | POA: Diagnosis not present

## 2019-10-27 DIAGNOSIS — Z85828 Personal history of other malignant neoplasm of skin: Secondary | ICD-10-CM | POA: Diagnosis not present

## 2019-10-27 DIAGNOSIS — L821 Other seborrheic keratosis: Secondary | ICD-10-CM | POA: Diagnosis not present

## 2019-11-08 DIAGNOSIS — D0361 Melanoma in situ of right upper limb, including shoulder: Secondary | ICD-10-CM | POA: Diagnosis not present

## 2019-11-08 DIAGNOSIS — Z85828 Personal history of other malignant neoplasm of skin: Secondary | ICD-10-CM | POA: Diagnosis not present

## 2019-11-20 ENCOUNTER — Other Ambulatory Visit: Payer: Self-pay | Admitting: Internal Medicine

## 2019-11-20 NOTE — Telephone Encounter (Signed)
Please refill as per office routine med refill policy (all routine meds refilled for 3 mo or monthly per pt preference up to one year from last visit, then month to month grace period for 3 mo, then further med refills will have to be denied)  

## 2019-11-23 DIAGNOSIS — Z85828 Personal history of other malignant neoplasm of skin: Secondary | ICD-10-CM | POA: Diagnosis not present

## 2019-11-23 DIAGNOSIS — L570 Actinic keratosis: Secondary | ICD-10-CM | POA: Diagnosis not present

## 2019-12-10 ENCOUNTER — Other Ambulatory Visit: Payer: Self-pay

## 2019-12-10 ENCOUNTER — Encounter: Payer: Self-pay | Admitting: Internal Medicine

## 2019-12-10 ENCOUNTER — Ambulatory Visit (INDEPENDENT_AMBULATORY_CARE_PROVIDER_SITE_OTHER): Payer: Medicare Other | Admitting: Internal Medicine

## 2019-12-10 VITALS — BP 114/64 | HR 82 | Temp 98.0°F | Ht 73.0 in | Wt 245.0 lb

## 2019-12-10 DIAGNOSIS — E559 Vitamin D deficiency, unspecified: Secondary | ICD-10-CM

## 2019-12-10 DIAGNOSIS — E119 Type 2 diabetes mellitus without complications: Secondary | ICD-10-CM

## 2019-12-10 DIAGNOSIS — E611 Iron deficiency: Secondary | ICD-10-CM

## 2019-12-10 DIAGNOSIS — I10 Essential (primary) hypertension: Secondary | ICD-10-CM

## 2019-12-10 DIAGNOSIS — N320 Bladder-neck obstruction: Secondary | ICD-10-CM

## 2019-12-10 DIAGNOSIS — E538 Deficiency of other specified B group vitamins: Secondary | ICD-10-CM

## 2019-12-10 DIAGNOSIS — E039 Hypothyroidism, unspecified: Secondary | ICD-10-CM

## 2019-12-10 DIAGNOSIS — C439 Malignant melanoma of skin, unspecified: Secondary | ICD-10-CM | POA: Insufficient documentation

## 2019-12-10 DIAGNOSIS — E785 Hyperlipidemia, unspecified: Secondary | ICD-10-CM

## 2019-12-10 HISTORY — DX: Malignant melanoma of skin, unspecified: C43.9

## 2019-12-10 LAB — CBC WITH DIFFERENTIAL/PLATELET
Basophils Absolute: 0 10*3/uL (ref 0.0–0.1)
Basophils Relative: 0.6 % (ref 0.0–3.0)
Eosinophils Absolute: 0.2 10*3/uL (ref 0.0–0.7)
Eosinophils Relative: 2.3 % (ref 0.0–5.0)
HCT: 43.2 % (ref 39.0–52.0)
Hemoglobin: 14.3 g/dL (ref 13.0–17.0)
Lymphocytes Relative: 21.5 % (ref 12.0–46.0)
Lymphs Abs: 1.4 10*3/uL (ref 0.7–4.0)
MCHC: 33.1 g/dL (ref 30.0–36.0)
MCV: 88.6 fl (ref 78.0–100.0)
Monocytes Absolute: 0.7 10*3/uL (ref 0.1–1.0)
Monocytes Relative: 10.1 % (ref 3.0–12.0)
Neutro Abs: 4.3 10*3/uL (ref 1.4–7.7)
Neutrophils Relative %: 65.5 % (ref 43.0–77.0)
Platelets: 145 10*3/uL — ABNORMAL LOW (ref 150.0–400.0)
RBC: 4.88 Mil/uL (ref 4.22–5.81)
RDW: 16.1 % — ABNORMAL HIGH (ref 11.5–15.5)
WBC: 6.6 10*3/uL (ref 4.0–10.5)

## 2019-12-10 LAB — URINALYSIS, ROUTINE W REFLEX MICROSCOPIC
Bilirubin Urine: NEGATIVE
Hgb urine dipstick: NEGATIVE
Leukocytes,Ua: NEGATIVE
Nitrite: NEGATIVE
RBC / HPF: NONE SEEN (ref 0–?)
Specific Gravity, Urine: >=1.03 — AB (ref 1.000–1.030)
Total Protein, Urine: NEGATIVE
Urine Glucose: 250 — AB
Urobilinogen, UA: 0.2 (ref 0.0–1.0)
pH: 5.5 (ref 5.0–8.0)

## 2019-12-10 LAB — LIPID PANEL
Cholesterol: 98 mg/dL (ref 0–200)
HDL: 39 mg/dL — ABNORMAL LOW (ref >39.00)
LDL Cholesterol: 46 mg/dL (ref 0–99)
NonHDL: 59.16
Total CHOL/HDL Ratio: 3
Triglycerides: 66 mg/dL (ref 0.0–149.0)
VLDL: 13.2 mg/dL (ref 0.0–40.0)

## 2019-12-10 LAB — MICROALBUMIN / CREATININE URINE RATIO
Creatinine,U: 117.9 mg/dL
Microalb Creat Ratio: 2 mg/g (ref 0.0–30.0)
Microalb, Ur: 2.4 mg/dL — ABNORMAL HIGH (ref 0.0–1.9)

## 2019-12-10 LAB — HEPATIC FUNCTION PANEL
ALT: 12 U/L (ref 0–53)
AST: 13 U/L (ref 0–37)
Albumin: 4.2 g/dL (ref 3.5–5.2)
Alkaline Phosphatase: 99 U/L (ref 39–117)
Bilirubin, Direct: 0.2 mg/dL (ref 0.0–0.3)
Total Bilirubin: 0.7 mg/dL (ref 0.2–1.2)
Total Protein: 7.3 g/dL (ref 6.0–8.3)

## 2019-12-10 LAB — VITAMIN D 25 HYDROXY (VIT D DEFICIENCY, FRACTURES): VITD: 50.94 ng/mL (ref 30.00–100.00)

## 2019-12-10 LAB — BASIC METABOLIC PANEL
BUN: 22 mg/dL (ref 6–23)
CO2: 28 mEq/L (ref 19–32)
Calcium: 9.5 mg/dL (ref 8.4–10.5)
Chloride: 103 mEq/L (ref 96–112)
Creatinine, Ser: 0.9 mg/dL (ref 0.40–1.50)
GFR: 82.57 mL/min (ref >60.00)
Glucose, Bld: 170 mg/dL — ABNORMAL HIGH (ref 70–99)
Potassium: 4.8 mEq/L (ref 3.5–5.1)
Sodium: 138 mEq/L (ref 135–145)

## 2019-12-10 LAB — IBC PANEL
Iron: 62 ug/dL (ref 42–165)
Saturation Ratios: 15.8 % — ABNORMAL LOW (ref 20.0–50.0)
Transferrin: 281 mg/dL (ref 212.0–360.0)

## 2019-12-10 LAB — HEMOGLOBIN A1C: Hgb A1c MFr Bld: 7.6 % — ABNORMAL HIGH (ref 4.6–6.5)

## 2019-12-10 LAB — VITAMIN B12: Vitamin B-12: 155 pg/mL — ABNORMAL LOW (ref 211–911)

## 2019-12-10 LAB — TSH: TSH: 0.25 u[IU]/mL — ABNORMAL LOW (ref 0.35–4.50)

## 2019-12-10 LAB — PSA: PSA: 2.06 ng/mL (ref 0.10–4.00)

## 2019-12-10 NOTE — Progress Notes (Signed)
Subjective:    Patient ID: Allen Myers, male    DOB: [birth year], 74 y.o.   MRN: [id number]  HPI Here to f/u; overall doing ok,  Pt denies chest pain, increasing sob or doe, wheezing, orthopnea, PND, increased LE swelling, palpitations, dizziness or syncope.  Pt denies new neurological symptoms such as new headache, or facial or extremity weakness or numbness.  Pt denies polydipsia, polyuria, or low sugar episode.  Pt states overall good compliance with meds, mostly trying to follow appropriate diet, with wt overall stable,  but little exercise however.  Plans to have optho appt soon.   Lost wt with better diet.  Denies hyper or hypo thyroid symptoms such as voice, skin or hair change. Wt Readings from Last 3 Encounters:  12/10/19 245 lb (111.1 kg)  07/05/19 263 lb (119.3 kg)  06/17/19 258 lb (117 kg)   Past Medical History:  Diagnosis Date   Allergy    Arthritis    CAD (coronary artery disease)    Erectile dysfunction    History of colon polyps    History of diverticulitis    HYPERTENSION    Hypothyroidism    Kidney stones    "passed it"   Melanoma ([place]) 12/10/2019   Peripheral arterial disease ([place])    SAH (subarachnoid hemorrhage) ([place])    Following syncopal episode   SDH (subdural hematoma) ([place])    Following syncopal episode   Seizures ([place])    08/2014 - controlled   Sleep apnea    "mild"  does not use CPAP   Sleep apnea in adult    Type II diabetes mellitus ([place])    type 2   Past Surgical History:  Procedure Laterality Date   COLONOSCOPY  2014   polyp   CORONARY ANGIOPLASTY WITH STENT PLACEMENT  05/11/2014   "1"   CRANIOTOMY Left 09/12/2014   Procedure: CRANIOTOMY HEMATOMA EVACUATION SUBDURAL;  Surgeon: Erline Levine, MD;  Location: [place] [facility] ORS;  Service: Neurosurgery;  Laterality: Left;   FRACTIONAL FLOW RESERVE WIRE  05/11/2014   Procedure: FRACTIONAL FLOW RESERVE WIRE;  Surgeon: [name], MD;  Location: [facility] CATH LAB;  Service:  Cardiovascular;;   LEFT HEART CATHETERIZATION WITH CORONARY ANGIOGRAM N/A 05/11/2014   Procedure: LEFT HEART CATHETERIZATION WITH CORONARY ANGIOGRAM;  Surgeon: [name], MD;  Location: [facility] C[facility] LAB;  Service: Cardiovascular;  Laterality: N/A;   POLYPECTOMY      reports that he quit smoking about 36 years ago. His smoking use included cigarettes. He has a 30.00 pack-year smoking history. He has never used smokeless tobacco. He reports that he does not drink alcohol or use drugs. family history includes AAA (abdominal aortic aneurysm) in his father; Aplastic anemia in his sister; Ataxia in his maternal grandmother; Dementia in his maternal grandmother; Heart disease in his father and mother; Lung cancer in his cousin and father; Prostate cancer in his brother. No Known Allergies Current Outpatient Medications on File Prior to Visit  Medication Sig Dispense Refill   Ascorbic Acid (VITAMIN C PO) Take 1 tablet by mouth.     aspirin EC 81 MG tablet Take 81 mg by mouth daily.     atorvastatin (LIPITOR) 80 MG tablet TAKE 1 TABLET DAILY 90 tablet 0   azithromycin (ZITHROMAX Z-PAK) 250 MG tablet 2 tablet by mouth day 1, then 1 per day 6 tablet 1   carvedilol (COREG) 3.125 MG tablet Take 1 tablet (3.125 mg total) by mouth 2 (two) times daily  with a meal. 180 tablet 1   cilostazol (PLETAL) 100 MG tablet TAKE 1 TABLET TWICE A DAY 60 tablet 2   linagliptin (TRADJENTA) 5 MG TABS tablet Take 1 tablet (5 mg total) by mouth daily. 90 tablet 1   lisinopril (ZESTRIL) 2.5 MG tablet Take 1 tablet (2.5 mg total) by mouth daily. 90 tablet 1   metFORMIN (GLUCOPHAGE) 1000 MG tablet TAKE 1 TABLET BY MOUTH TWICE A DAY WITH A MEAL 180 tablet 1   nitroGLYCERIN (NITROSTAT) 0.4 MG SL tablet PLACE 1 TABLET UNDER THE   TONGUE AND ALLOW TO        DISSOLVE EVERY 5 MINUTES ASNEEDED FOR CHEST PAIN 25 tablet 12   omeprazole (PRILOSEC) 40 MG capsule Take 1 capsule (40 mg total) by mouth daily. 90 capsule 1   ONE  TOUCH ULTRA TEST test strip USE TO CHECK BLOOD SUGAR TWICE A DAY AS DIRECTED 100 each 0   pioglitazone (ACTOS) 45 MG tablet TAKE 1 TABLET DAILY 90 tablet 3   No current facility-administered medications on file prior to visit.   Review of Systems All otherwise neg per pt     Objective:   Physical Exam BP 114/64    Pulse 82    Temp 98 F (36.7 C)    Ht 6\' 1"  (1.854 m)    Wt 245 lb (111.1 kg)    SpO2 98%    BMI 32.32 kg/m  VS noted,  Constitutional: Pt appears in NAD HENT: Head: NCAT.  Right Ear: External ear normal.  Left Ear: External ear normal.  Eyes: . Pupils are equal, round, and reactive to light. Conjunctivae and EOM are normal Nose: without d/c or deformity Neck: Neck supple. Gross normal ROM Cardiovascular: Normal rate and regular rhythm.   Pulmonary/Chest: Effort normal and breath sounds without rales or wheezing.  Abd:  Soft, NT, ND, + BS, no organomegaly Neurological: Pt is alert. At baseline orientation, motor grossly intact Skin: Skin is warm. No rashes, other new lesions, no LE edema Psychiatric: Pt behavior is normal without agitation  All otherwise neg per pt Lab Results  Component Value Date   WBC 6.6 12/10/2019   HGB 14.3 12/10/2019   HCT 43.2 12/10/2019   PLT 145.0 (L) 12/10/2019   GLUCOSE 170 (H) 12/10/2019   CHOL 98 12/10/2019   TRIG 66.0 12/10/2019   HDL 39.00 (L) 12/10/2019   LDLDIRECT 85.7 06/11/2013   LDLCALC 46 12/10/2019   ALT 12 12/10/2019   AST 13 12/10/2019   NA 138 12/10/2019   K 4.8 12/10/2019   CL 103 12/10/2019   CREATININE 0.90 12/10/2019   BUN 22 12/10/2019   CO2 28 12/10/2019   TSH 0.25 (L) 12/10/2019   PSA 2.06 12/10/2019   INR 1.07 09/06/2014   HGBA1C 7.6 (H) 12/10/2019   MICROALBUR 2.4 (H) 12/10/2019      Assessment & Plan:

## 2019-12-10 NOTE — Patient Instructions (Addendum)
Please remember to call for your yearly eye appointment  Please continue all other medications as before, and refills have been done if requested.  Please have the pharmacy call with any other refills you may need.  Please continue your efforts at being more active, low cholesterol diet, and weight control.  You are otherwise up to date with prevention measures today.  Please keep your appointments with your specialists as you may have planned  Please go to the LAB at the blood drawing area for the tests to be done  You will be contacted by phone if any changes need to be made immediately.  Otherwise, you will receive a letter about your results with an explanation, but please check with MyChart first.  Please remember to sign up for MyChart if you have not done so, as this will be important to you in the future with finding out test results, communicating by private email, and scheduling acute appointments online when needed.  Please make an Appointment to return in 6 months, or sooner if needed

## 2019-12-11 ENCOUNTER — Other Ambulatory Visit: Payer: Self-pay | Admitting: Internal Medicine

## 2019-12-11 MED ORDER — LEVOTHYROXINE SODIUM 137 MCG PO TABS: 137.0000 ug | ORAL_TABLET | Freq: Every day | ORAL | 3 refills | Status: DC

## 2019-12-11 MED ORDER — VITAMIN B-12 1000 MCG PO TABS: 1000.0000 ug | ORAL_TABLET | Freq: Every day | ORAL | 1 refills | Status: AC

## 2019-12-12 ENCOUNTER — Encounter: Payer: Self-pay | Admitting: Internal Medicine

## 2019-12-12 NOTE — Assessment & Plan Note (Signed)
stable overall by history and exam, recent data reviewed with pt, and pt to continue medical treatment as before,  to f/u any worsening symptoms or concerns  

## 2019-12-12 NOTE — Assessment & Plan Note (Addendum)
stable overall by history and exam, recent data reviewed with pt, and pt to continue medical treatment as before,  to f/u any worsening symptoms or concerns  I spent 31 minutes in preparing to see the patient by review of recent labs, imaging and procedures, obtaining and reviewing separately obtained history, communicating with the patient and family or caregiver, ordering medications, tests or procedures, and documenting clinical information in the EHR including the differential Dx, treatment, and any further evaluation and other management of low thyroid, HTN, HLD, DM
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
## 2019-12-20 ENCOUNTER — Ambulatory Visit: Payer: Medicare Other

## 2019-12-21 ENCOUNTER — Ambulatory Visit: Payer: Medicare Other | Admitting: Cardiology

## 2019-12-23 ENCOUNTER — Telehealth: Payer: Self-pay | Admitting: Internal Medicine

## 2019-12-23 NOTE — Telephone Encounter (Signed)
New Message:   Pt is calling and states he was sent a letter stating that his B-12 was denied by insurance. He states he was not aware that he had even had a prescription sent in for this medication. Please advise.

## 2019-12-24 NOTE — Telephone Encounter (Signed)
A letter or mychart message was done after feb 26 labs  [place] to take OTC B12 1000 mcg per day for 6 mo for low Vitamin B12

## 2019-12-24 NOTE — Telephone Encounter (Signed)
Notified pt w/MD response.../lmb 

## 2019-12-29 ENCOUNTER — Encounter: Payer: Self-pay | Admitting: Internal Medicine

## 2019-12-30 ENCOUNTER — Ambulatory Visit: Payer: Medicare Other | Attending: Internal Medicine

## 2019-12-30 DIAGNOSIS — Z23 Encounter for immunization: Secondary | ICD-10-CM

## 2019-12-30 NOTE — Progress Notes (Signed)
   Covid-19 Vaccination Clinic  Name:  Allen Myers    MRN: [id number] DOB: 02-07-1946  12/30/2019  [name] was observed post Covid-19 immunization for 15 minutes without incident. He was provided with Vaccine Information Sheet and instruction to access the V-Safe system.   [name] was instructed to call 911 with any severe reactions post vaccine: Marland Kitchen Difficulty breathing  . Swelling of face and throat  . A fast heartbeat  . A bad rash all over body  . Dizziness and weakness   Immunizations Administered    Name Date Dose VIS Date Route   Pfizer COVID-19 Vaccine 12/30/2019  8:10 AM 0.3 mL 09/24/2019 Intramuscular   Manufacturer: [place]   Lot: [id number]   [place]: [id number]

## 2020-01-03 ENCOUNTER — Other Ambulatory Visit: Payer: Self-pay | Admitting: Internal Medicine

## 2020-01-03 NOTE — Telephone Encounter (Signed)
Please refill as per office routine med refill policy (all routine meds refilled for 3 mo or monthly per pt preference up to one year from last visit, then month to month grace period for 3 mo, then further med refills will have to be denied)  

## 2020-01-10 ENCOUNTER — Other Ambulatory Visit: Payer: Self-pay | Admitting: Internal Medicine

## 2020-01-10 NOTE — Telephone Encounter (Signed)
Please refill as per office routine med refill policy (all routine meds refilled for 3 mo or monthly per pt preference up to one year from last visit, then month to month grace period for 3 mo, then further med refills will have to be denied)  

## 2020-01-14 ENCOUNTER — Other Ambulatory Visit: Payer: Self-pay | Admitting: Internal Medicine

## 2020-01-17 NOTE — Telephone Encounter (Signed)
Please refill as per office routine med refill policy (all routine meds refilled for 3 mo or monthly per pt preference up to one year from last visit, then month to month grace period for 3 mo, then further med refills will have to be denied)  

## 2020-01-18 ENCOUNTER — Telehealth: Payer: Self-pay | Admitting: Internal Medicine

## 2020-01-18 NOTE — Telephone Encounter (Signed)
Patient has dropped off renewal DMV forms.  Forms have been completed &Placed in providers box to review and sign.

## 2020-01-20 ENCOUNTER — Other Ambulatory Visit: Payer: Self-pay | Admitting: Adult Health

## 2020-01-20 NOTE — Telephone Encounter (Signed)
Forms have been signed, Copy sent to scan.   Patient informed and will pick up original forms.

## 2020-01-24 ENCOUNTER — Ambulatory Visit: Payer: Medicare Other | Attending: Internal Medicine

## 2020-01-24 DIAGNOSIS — Z23 Encounter for immunization: Secondary | ICD-10-CM

## 2020-01-24 NOTE — Progress Notes (Signed)
   Covid-19 Vaccination Clinic  Name:  NETANEL HANELINE    MRN: [id number] DOB: Apr 22, 1946  01/24/2020  Mr. Rabel was observed post Covid-19 immunization for 15 minutes without incident. He was provided with Vaccine Information Sheet and instruction to access the V-Safe system.   Mr. Grall was instructed to call 911 with any severe reactions post vaccine: Marland Kitchen Difficulty breathing  . Swelling of face and throat  . A fast heartbeat  . A bad rash all over body  . Dizziness and weakness   Immunizations Administered    Name Date Dose VIS Date Route   Pfizer COVID-19 Vaccine 01/24/2020  8:09 AM 0.3 mL 09/24/2019 Intramuscular   Manufacturer: [place]   Lot: [id number]   [place]: [id number]

## 2020-01-31 ENCOUNTER — Telehealth: Payer: Self-pay | Admitting: Cardiology

## 2020-01-31 NOTE — Telephone Encounter (Signed)
Patient states he is calling to follow up in regards to the status of paperwork to be signed by Dr. Stanford Breed for the [facility]. Please call.

## 2020-01-31 NOTE — Telephone Encounter (Signed)
Routed to Debra RN 

## 2020-02-01 NOTE — Telephone Encounter (Signed)
Paperwork placed at the front desk for patient pick up, patient aware via my chart.
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
## 2020-02-08 ENCOUNTER — Other Ambulatory Visit: Payer: Self-pay | Admitting: Internal Medicine

## 2020-02-08 NOTE — Telephone Encounter (Signed)
Please refill as per office routine med refill policy (all routine meds refilled for 3 mo or monthly per pt preference up to one year from last visit, then month to month grace period for 3 mo, then further med refills will have to be denied)  

## 2020-02-10 ENCOUNTER — Ambulatory Visit: Payer: Medicare Other | Admitting: Cardiology

## 2020-02-14 ENCOUNTER — Other Ambulatory Visit: Payer: Self-pay | Admitting: Internal Medicine

## 2020-02-15 ENCOUNTER — Telehealth: Payer: Self-pay | Admitting: *Deleted

## 2020-02-15 NOTE — Telephone Encounter (Signed)
Received DMV form to be completed, pt has appt 02-21-20 at 0945 with JM/NP.  (this form at her desk for appt) INBOX.

## 2020-02-15 NOTE — Telephone Encounter (Signed)
Please refill as per office routine med refill policy (all routine meds refilled for 3 mo or monthly per pt preference up to one year from last visit, then month to month grace period for 3 mo, then further med refills will have to be denied)  

## 2020-02-15 NOTE — Telephone Encounter (Signed)
Noted! Thank you

## 2020-02-17 ENCOUNTER — Inpatient Hospital Stay (HOSPITAL_COMMUNITY): Admission: RE | Admit: 2020-02-17 | Payer: Medicare Other | Source: Ambulatory Visit

## 2020-02-21 ENCOUNTER — Ambulatory Visit (INDEPENDENT_AMBULATORY_CARE_PROVIDER_SITE_OTHER): Payer: Medicare Other | Admitting: Adult Health

## 2020-02-21 ENCOUNTER — Other Ambulatory Visit: Payer: Self-pay

## 2020-02-21 ENCOUNTER — Encounter: Payer: Self-pay | Admitting: Adult Health

## 2020-02-21 VITALS — BP 98/60 | HR 84 | Temp 96.9°F | Ht 73.0 in | Wt 256.0 lb

## 2020-02-21 DIAGNOSIS — R569 Unspecified convulsions: Secondary | ICD-10-CM

## 2020-02-21 DIAGNOSIS — Z8679 Personal history of other diseases of the circulatory system: Secondary | ICD-10-CM | POA: Diagnosis not present

## 2020-02-21 NOTE — Patient Instructions (Signed)
Your Plan:  Continue Keppra 1000 mg twice daily for seizure prevention -refill provided  DMV form completed    Follow-up in 1year or call earlier if needed     Thank you for coming to see [place] at [facility]. I hope we have been able to provide you high quality care today.  You may receive a patient satisfaction survey over the next few weeks. We would appreciate your feedback and comments so that we may continue to improve ourselves and the health of our patients.

## 2020-02-21 NOTE — Progress Notes (Signed)
[facility]  PATIENT: Allen Myers DOB: [birth year]   REASON FOR VISIT: SDH 03/2014 and seizure disorder HISTORY FROM: patient   Chief complaint: Chief Complaint  Patient presents with  . Follow-up    Tx room. Pt alone with no new concerns. Michela Pitcher he is just here for a routine sz f/u.  Marland Kitchen Seizures      HPI  Allen Myers is a 74 y.o. male who continues to be followed in this office for history of subdural hematoma 03/2014 and seizure disorder.  Today, 02/21/2020, Allen Myers returns for 1 year follow-up.  He has been stable since prior visit on 02/18/2019 without new or reoccurring stroke/TIA symptoms or recent seizure activity.  No reoccurring seizure since 2015.  Continues on aspirin 81 mg daily and atorvastatin for secondary stroke prevention.  Blood pressure today 98/60.  Remains on Keppra 1000 mg twice daily tolerating well without side effects.  No concerns at this time.      REVIEW OF SYSTEMS: Full 14 system review of systems performed and notable only for those  listed, all others are neg:  No complaints   ALLERGIES: No Known Allergies  HOME MEDICATIONS: Outpatient Medications Prior to Visit  Medication Sig Dispense Refill  . Ascorbic Acid (VITAMIN C PO) Take 1 tablet by mouth.    Marland Kitchen aspirin EC 81 MG tablet Take 81 mg by mouth daily.    Marland Kitchen atorvastatin (LIPITOR) 80 MG tablet TAKE 1 TABLET DAILY 90 tablet 0  . azithromycin (ZITHROMAX Z-PAK) 250 MG tablet 2 tablet by mouth day 1, then 1 per day 6 tablet 1  . carvedilol (COREG) 3.125 MG tablet Take 1 tablet (3.125 mg total) by mouth 2 (two) times daily with a meal. 180 tablet 1  . cilostazol (PLETAL) 100 MG tablet TAKE 1 TABLET TWICE A DAY 60 tablet 2  . levETIRAcetam (KEPPRA) 1000 MG tablet TAKE 1 TABLET TWICE A DAY 180 tablet 3  . levothyroxine (SYNTHROID) 137 MCG tablet Take 1 tablet (137 mcg total) by mouth daily before breakfast. 90 tablet 3  . linagliptin (TRADJENTA) 5 MG TABS tablet Take 1 tablet  (5 mg total) by mouth daily. 90 tablet 1  . lisinopril (ZESTRIL) 2.5 MG tablet TAKE 1 TABLET DAILY 90 tablet 2  . metFORMIN (GLUCOPHAGE) 1000 MG tablet TAKE 1 TABLET TWICE DAILY  WITH MEALS 180 tablet 1  . nitroGLYCERIN (NITROSTAT) 0.4 MG SL tablet PLACE 1 TABLET UNDER THE   TONGUE AND ALLOW TO        DISSOLVE EVERY 5 MINUTES ASNEEDED FOR CHEST PAIN 25 tablet 12  . omeprazole (PRILOSEC) 40 MG capsule TAKE 1 CAPSULE DAILY 90 capsule 2  . ONE TOUCH ULTRA TEST test strip USE TO CHECK BLOOD SUGAR TWICE A DAY AS DIRECTED 100 each 0  . pioglitazone (ACTOS) 45 MG tablet TAKE 1 TABLET DAILY 90 tablet 3  . vitamin B-12 (CYANOCOBALAMIN) 1000 MCG tablet Take 1 tablet (1,000 mcg total) by mouth daily. 90 tablet 1   No facility-administered medications prior to visit.    PAST MEDICAL HISTORY: Past Medical History:  Diagnosis Date  . Allergy   . Arthritis   . CAD (coronary artery disease)   . Erectile dysfunction   . History of colon polyps   . History of diverticulitis   . HYPERTENSION   . Hypothyroidism   . Kidney stones    "passed it"  . Melanoma ([place]) 12/10/2019  . Peripheral arterial disease ([place])   . SAH (  subarachnoid hemorrhage) ([place])    Following syncopal episode  . SDH (subdural hematoma) (H[facility])    Following syncopal episode  . Seizures ([place])    08/2014 - controlled  . Sleep apnea    "mild"  does not use CPAP  . Sleep apnea in adult   . Type II diabetes mellitus ([place])    type 2    PAST SURGICAL HISTORY: Past Surgical History:  Procedure Laterality Date  . COLONOSCOPY  2014   polyp  . CORONARY ANGIOPLASTY WITH STENT PLACEMENT  05/11/2014   "1"  . CRANIOTOMY Left 09/12/2014   Procedure: CRANIOTOMY HEMATOMA EVACUATION SUBDURAL;  Surgeon: Erline Levine, MD;  Location: [place] [facility]URO ORS;  Service: Neurosurgery;  Laterality: Left;  . FRACTIONAL FLOW RESERVE WIRE  05/11/2014   Procedure: FRACTIONAL FLOW RESERVE WIRE;  Surgeon: [name], MD;  Location: [facility] CATH LAB;  Service:  Cardiovascular;;  . LEFT HEART CATHETERIZATION WITH CORONARY ANGIOGRAM N/A 05/11/2014   Procedure: LEFT HEART CATHETERIZATION WITH CORONARY ANGIOGRAM;  Surgeon: [name], MD;  Location: [facility] CATH LAB;  Service: Cardiovascular;  Laterality: N/A;  . POLYPECTOMY      FAMILY HISTORY: Family History  Problem Relation Age of Onset  . Lung cancer Father        dad was a smoker  . Heart disease Father   . AAA (abdominal aortic aneurysm) Father   . Heart disease Mother   . Ataxia Maternal Grandmother   . Dementia Maternal Grandmother   . Aplastic anemia Sister   . Prostate cancer Brother   . Lung cancer Cousin   . Colon cancer Neg Hx   . Rectal cancer Neg Hx   . Stomach cancer Neg Hx   . Colon polyps Neg Hx   . Esophageal cancer Neg Hx     SOCIAL HISTORY: Social History   Socioeconomic History  . Marital status: Married    Spouse name: Beverlee Nims  . Number of children: 3  . Years of education: 3  . Highest education level: Not on file  Occupational History  . 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 Occupation: [phone] (CELL)    Employer: [employer]  Tobacco Use  . Smoking status: Former Smoker    Packs/day: 1.00    Years: 30.00    Pack years: 30.00    Types: Cigarettes    Quit date: 10/15/1983    Years since quitting: 36.3  . Smokeless tobacco: Never Used  Substance and Sexual Activity  . Alcohol use: No    Alcohol/week: 0.0 standard drinks  . Drug use: No  . Sexual activity: Yes  Other Topics Concern  . Not on file  Social History Narrative   Patient is married with 3 daughters 11, 62, 18 and 2 grandchildren.   Patient is right handed.   Patient has hs education.   Patient drinks decaf, and soda occasionally.   Social Determinants of Health   Financial Resource Strain:   . Difficulty of Paying Living Expenses:   Food Insecurity:   . Worried About Charity fundraiser  in the Last Year:   . Arboriculturist in the Last Year:   Transportation Needs:   . Film/video editor (Medical):   Marland Kitchen Lack of Transportation (Non-Medical):   Physical Activity:   . Days of Exercise per Week:   . Minutes of Exercise per Session:   Stress:   .  Feeling of Stress :   Social Connections:   . Frequency of Communication with Friends and Family:   . Frequency of Social Gatherings with Friends and Family:   . Attends Religious Services:   . Active Member of Clubs or Organizations:   . Attends Archivist Meetings:   Marland Kitchen Marital Status:   Intimate Partner Violence:   . Fear of Current or Ex-Partner:   . Emotionally Abused:   Marland Kitchen Physically Abused:   . Sexually Abused:        Vital signs Today's Vitals   02/21/20 0945  BP: 98/60  Pulse: 84  Temp: (!) 96.9 F (36.1 C)  Weight: 256 lb (116.1 kg)  Height: 6\' 1"  (1.854 m)   Body mass index is 33.78 kg/m.    Physical exam General: well developed, well nourished,  pleasant elderly Caucasian male, seated, in no evident distress Head: head normocephalic and atraumatic.   Neck: supple with no carotid or supraclavicular bruits Cardiovascular: regular rate and rhythm, no murmurs Musculoskeletal: no deformity Skin:  no rash/petichiae Vascular:  Normal pulses all extremities   Neurologic Exam Mental Status: Awake and fully alert. Oriented to place and time. Recent and remote memory intact. Attention span, concentration and fund of knowledge appropriate. Mood and affect appropriate.  Cranial Nerves: Pupils equal, briskly reactive to light. Extraocular movements full without nystagmus. Visual fields full to confrontation. Hearing intact. Facial sensation intact. Face, tongue, palate moves normally and symmetrically.  Motor: Normal bulk and tone. Normal strength in all tested extremity muscles. Sensory.: intact to touch , pinprick , position and vibratory sensation.  Coordination: Rapid alternating movements  normal in all extremities. Finger-to-nose and heel-to-shin performed accurately bilaterally. Gait and Station: Arises from chair without difficulty. Stance is normal. Gait demonstrates normal stride length and balance Reflexes: 1+ and symmetric. Toes downgoing.         ASSESSMENT AND PLAN 74 y.o. Caucasian male with PMH of CAD s/p stent in 04/2014 on ASA , right SDH s/p fall with seizure in 03/2014 put on keppra was admitted again in 08/2014 for left SDH s/p fall. Had slight enlargement of left SDH over days and frequent focal seizure with aphasia and right cheek numbness. Increased keppra dose to 1000mg  bid. Had SDH evacuation on 09/12/14. Discharged with ASA 81mg . 09/2014, repeat CT showed evolving left SDH. Followed up with cardiology and OK with only on baby ASA.  No reoccurring seizure activity since 2015 with ongoing compliance with Keppra.    Plan:  -continue ASA 81mg  and lipitor for stroke and cardiac prevention. -continue keppra 1000mg  bid for seizure prophylaxis -refill provided -Patient brought in [facility] paperwork to be cleared to continue to drive.  He states paperwork needs to be filled out every 2 years which has been ongoing since seizure activity in 2015.  He does plan on contacting [facility] for request of discharge from program as he has been stable.  Advised him we will assist in regards to this request as he has been stable from a neurological standpoint since 2015 and compliant with all recommended treatment -Continue to follow up with Dr. Jenny Reichmann for stroke risk factor modification.  -Recommend maintain blood pressure goal less than 130/90, diabetes with hemoglobin A1c goal below 7% and lipids with LDL cholesterol goal below 70 mg/dL.   He will return in 1 year or call earlier if needed   I spent 23 minutes of face-to-face and non-face-to-face time with patient.  This included previsit chart review, lab review, study  review, order entry, electronic health record documentation,  patient education regarding history of SDH, seizure, importance of managing stroke risk factors and answered all questions to patient satisfaction   Frann Rider, [facility]  [facility] [address] [place], [place] [address]  Phone [phone] Fax [phone] Note: This document was prepared with digital dictation and possible smart phrase technology. Any transcriptional errors that result from this process are unintentional.

## 2020-02-22 ENCOUNTER — Other Ambulatory Visit: Payer: Self-pay | Admitting: *Deleted

## 2020-02-22 ENCOUNTER — Other Ambulatory Visit: Payer: Self-pay

## 2020-02-22 ENCOUNTER — Other Ambulatory Visit (HOSPITAL_COMMUNITY): Payer: Self-pay | Admitting: Cardiology

## 2020-02-22 ENCOUNTER — Encounter: Payer: Self-pay | Admitting: *Deleted

## 2020-02-22 ENCOUNTER — Ambulatory Visit (HOSPITAL_COMMUNITY)
Admission: RE | Admit: 2020-02-22 | Discharge: 2020-02-22 | Disposition: A | Discharge status: Home / Self Care | Payer: Medicare Other | Source: Ambulatory Visit | Attending: Cardiology | Admitting: Cardiology

## 2020-02-22 DIAGNOSIS — I714 Abdominal aortic aneurysm, without rupture, unspecified: Secondary | ICD-10-CM

## 2020-02-22 NOTE — Progress Notes (Signed)
I agree with the above plan 

## 2020-02-22 NOTE — Progress Notes (Signed)
vas 

## 2020-03-19 DIAGNOSIS — E119 Type 2 diabetes mellitus without complications: Secondary | ICD-10-CM | POA: Insufficient documentation

## 2020-03-19 DIAGNOSIS — J069 Acute upper respiratory infection, unspecified: Secondary | ICD-10-CM | POA: Diagnosis not present

## 2020-03-19 DIAGNOSIS — Z87442 Personal history of urinary calculi: Secondary | ICD-10-CM | POA: Insufficient documentation

## 2020-03-19 DIAGNOSIS — I119 Hypertensive heart disease without heart failure: Secondary | ICD-10-CM | POA: Insufficient documentation

## 2020-03-21 ENCOUNTER — Other Ambulatory Visit: Payer: Self-pay | Admitting: Internal Medicine

## 2020-03-21 NOTE — Telephone Encounter (Signed)
Please refill as per office routine med refill policy (all routine meds refilled for 3 mo or monthly per pt preference up to one year from last visit, then month to month grace period for 3 mo, then further med refills will have to be denied)  
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
## 2020-04-24 ENCOUNTER — Ambulatory Visit (INDEPENDENT_AMBULATORY_CARE_PROVIDER_SITE_OTHER): Payer: Medicare Other | Admitting: Cardiology

## 2020-04-24 ENCOUNTER — Encounter: Payer: Self-pay | Admitting: Cardiology

## 2020-04-24 ENCOUNTER — Other Ambulatory Visit: Payer: Self-pay

## 2020-04-24 VITALS — BP 106/60 | HR 75 | Ht 73.0 in | Wt 257.6 lb

## 2020-04-24 DIAGNOSIS — I714 Abdominal aortic aneurysm, without rupture, unspecified: Secondary | ICD-10-CM

## 2020-04-24 DIAGNOSIS — E7800 Pure hypercholesterolemia, unspecified: Secondary | ICD-10-CM

## 2020-04-24 DIAGNOSIS — I2510 Atherosclerotic heart disease of native coronary artery without angina pectoris: Secondary | ICD-10-CM

## 2020-04-24 DIAGNOSIS — I10 Essential (primary) hypertension: Secondary | ICD-10-CM | POA: Diagnosis not present

## 2020-04-24 NOTE — Patient Instructions (Signed)

## 2020-04-25 DIAGNOSIS — D692 Other nonthrombocytopenic purpura: Secondary | ICD-10-CM | POA: Diagnosis not present

## 2020-04-25 DIAGNOSIS — D1801 Hemangioma of skin and subcutaneous tissue: Secondary | ICD-10-CM | POA: Diagnosis not present

## 2020-04-25 DIAGNOSIS — L821 Other seborrheic keratosis: Secondary | ICD-10-CM | POA: Diagnosis not present

## 2020-04-25 DIAGNOSIS — D485 Neoplasm of uncertain behavior of skin: Secondary | ICD-10-CM | POA: Diagnosis not present

## 2020-04-25 DIAGNOSIS — D225 Melanocytic nevi of trunk: Secondary | ICD-10-CM | POA: Diagnosis not present

## 2020-04-25 DIAGNOSIS — Z85828 Personal history of other malignant neoplasm of skin: Secondary | ICD-10-CM | POA: Diagnosis not present

## 2020-04-25 DIAGNOSIS — L570 Actinic keratosis: Secondary | ICD-10-CM | POA: Diagnosis not present

## 2020-04-25 DIAGNOSIS — C44622 Squamous cell carcinoma of skin of right upper limb, including shoulder: Secondary | ICD-10-CM | POA: Diagnosis not present

## 2020-04-25 DIAGNOSIS — Z85820 Personal history of malignant melanoma of skin: Secondary | ICD-10-CM | POA: Diagnosis not present

## 2020-04-27 ENCOUNTER — Other Ambulatory Visit: Payer: Self-pay | Admitting: Internal Medicine

## 2020-04-27 NOTE — Telephone Encounter (Signed)
Please refill as per office routine med refill policy (all routine meds refilled for 3 mo or monthly per pt preference up to one year from last visit, then month to month grace period for 3 mo, then further med refills will have to be denied)  

## 2020-05-04 DIAGNOSIS — C44622 Squamous cell carcinoma of skin of right upper limb, including shoulder: Secondary | ICD-10-CM | POA: Diagnosis not present

## 2020-05-10 ENCOUNTER — Encounter: Payer: Self-pay | Admitting: Internal Medicine

## 2020-05-23 ENCOUNTER — Telehealth: Payer: Self-pay

## 2020-05-23 NOTE — Telephone Encounter (Signed)
1.Medication Requested:carvedilol (COREG) 3.125 MG tablet  2. Pharmacy ([place]):[facility] [place] [place] to Registered [facility] Sites  3. On Med List: Yes   4. Last Visit with PCP: 2.26.21   5. Next visit date with PCP: n/a    Agent: Please be advised that RX refills may take up to 3 business days. We ask that you follow-up with your pharmacy.

## 2020-05-24 ENCOUNTER — Other Ambulatory Visit: Payer: Self-pay

## 2020-05-24 MED ORDER — CARVEDILOL 3.125 MG PO TABS: 3.1250 mg | ORAL_TABLET | Freq: Two times a day (BID) | ORAL | 1 refills | Status: DC

## 2020-05-24 NOTE — Telephone Encounter (Signed)
Sent in to pharmacy.  

## 2020-05-26 ENCOUNTER — Other Ambulatory Visit: Payer: Self-pay | Admitting: Internal Medicine

## 2020-05-26 NOTE — Telephone Encounter (Signed)
Please refill as per office routine med refill policy (all routine meds refilled for 3 mo or monthly per pt preference up to one year from last visit, then month to month grace period for 3 mo, then further med refills will have to be denied)  

## 2020-06-07 ENCOUNTER — Other Ambulatory Visit: Payer: Self-pay | Admitting: Internal Medicine

## 2020-06-07 NOTE — Telephone Encounter (Signed)
Please refill as per office routine med refill policy (all routine meds refilled for 3 mo or monthly per pt preference up to one year from last visit, then month to month grace period for 3 mo, then further med refills will have to be denied)  

## 2020-06-28 DIAGNOSIS — M533 Sacrococcygeal disorders, not elsewhere classified: Secondary | ICD-10-CM | POA: Diagnosis not present

## 2020-07-03 DIAGNOSIS — M533 Sacrococcygeal disorders, not elsewhere classified: Secondary | ICD-10-CM | POA: Diagnosis not present

## 2020-07-18 ENCOUNTER — Other Ambulatory Visit: Payer: Self-pay | Admitting: Internal Medicine

## 2020-07-18 DIAGNOSIS — M533 Sacrococcygeal disorders, not elsewhere classified: Secondary | ICD-10-CM | POA: Diagnosis not present

## 2020-07-18 NOTE — Telephone Encounter (Signed)
Please refill as per office routine med refill policy (all routine meds refilled for 3 mo or monthly per pt preference up to one year from last visit, then month to month grace period for 3 mo, then further med refills will have to be denied)  

## 2020-07-29 ENCOUNTER — Ambulatory Visit: Payer: Medicare Other | Attending: Internal Medicine

## 2020-07-29 DIAGNOSIS — Z23 Encounter for immunization: Secondary | ICD-10-CM

## 2020-07-29 NOTE — Progress Notes (Signed)
   Covid-19 Vaccination Clinic  Name:  Allen Myers    MRN: [id number] DOB: Feb 19, 1946  07/29/2020  Allen Myers was observed post Covid-19 immunization for 15 minutes without incident. He was provided with Vaccine Information Sheet and instruction to access the V-Safe system.   Allen Myers was instructed to call 911 with any severe reactions post vaccine: Marland Kitchen Difficulty breathing  . Swelling of face and throat  . A fast heartbeat  . A bad rash all over body  . Dizziness and weakness

## 2020-07-31 ENCOUNTER — Telehealth: Payer: Self-pay | Admitting: Internal Medicine

## 2020-07-31 NOTE — Telephone Encounter (Signed)
metFORMIN (GLUCOPHAGE) 1000 MG tablet [facility] [place] [place] to Reg[facility] [place] [facility] Phone:  [phone]  Fax:  [phone]    Requesting a refill  Last apt-12/10/19 Next- N/A

## 2020-08-01 ENCOUNTER — Other Ambulatory Visit: Payer: Self-pay

## 2020-08-01 MED ORDER — METFORMIN HCL 1000 MG PO TABS: ORAL_TABLET | ORAL | 1 refills | Status: DC

## 2020-08-02 ENCOUNTER — Other Ambulatory Visit: Payer: Self-pay | Admitting: Internal Medicine

## 2020-08-02 NOTE — Telephone Encounter (Signed)
Please refill as per office routine med refill policy (all routine meds refilled for 3 mo or monthly per pt preference up to one year from last visit, then month to month grace period for 3 mo, then further med refills will have to be denied)  

## 2020-08-04 DIAGNOSIS — M6281 Muscle weakness (generalized): Secondary | ICD-10-CM | POA: Diagnosis not present

## 2020-08-04 DIAGNOSIS — M5386 Other specified dorsopathies, lumbar region: Secondary | ICD-10-CM | POA: Diagnosis not present

## 2020-08-04 DIAGNOSIS — R29898 Other symptoms and signs involving the musculoskeletal system: Secondary | ICD-10-CM | POA: Diagnosis not present

## 2020-08-04 DIAGNOSIS — M533 Sacrococcygeal disorders, not elsewhere classified: Secondary | ICD-10-CM | POA: Diagnosis not present

## 2020-08-14 DIAGNOSIS — M533 Sacrococcygeal disorders, not elsewhere classified: Secondary | ICD-10-CM | POA: Diagnosis not present

## 2020-08-17 DIAGNOSIS — M533 Sacrococcygeal disorders, not elsewhere classified: Secondary | ICD-10-CM | POA: Diagnosis not present

## 2020-08-21 ENCOUNTER — Other Ambulatory Visit: Payer: Self-pay | Admitting: Internal Medicine

## 2020-08-24 DIAGNOSIS — M533 Sacrococcygeal disorders, not elsewhere classified: Secondary | ICD-10-CM | POA: Diagnosis not present

## 2020-08-29 ENCOUNTER — Other Ambulatory Visit: Payer: Self-pay | Admitting: Internal Medicine

## 2020-08-29 DIAGNOSIS — M533 Sacrococcygeal disorders, not elsewhere classified: Secondary | ICD-10-CM | POA: Diagnosis not present

## 2020-08-29 NOTE — Telephone Encounter (Signed)
Please refill as per office routine med refill policy (all routine meds refilled for 3 mo or monthly per pt preference up to one year from last visit, then month to month grace period for 3 mo, then further med refills will have to be denied)  

## 2020-09-04 DIAGNOSIS — M533 Sacrococcygeal disorders, not elsewhere classified: Secondary | ICD-10-CM | POA: Diagnosis not present

## 2020-09-05 ENCOUNTER — Other Ambulatory Visit: Payer: Self-pay | Admitting: Internal Medicine

## 2020-09-05 NOTE — Telephone Encounter (Signed)
Please refill as per office routine med refill policy (all routine meds refilled for 3 mo or monthly per pt preference up to one year from last visit, then month to month grace period for 3 mo, then further med refills will have to be denied)  

## 2020-09-11 ENCOUNTER — Encounter: Payer: Self-pay | Admitting: Internal Medicine

## 2020-09-12 ENCOUNTER — Telehealth: Payer: Self-pay | Admitting: Internal Medicine

## 2020-09-12 DIAGNOSIS — Z20822 Contact with and (suspected) exposure to covid-19: Secondary | ICD-10-CM | POA: Diagnosis not present

## 2020-09-12 DIAGNOSIS — B001 Herpesviral vesicular dermatitis: Secondary | ICD-10-CM | POA: Diagnosis not present

## 2020-09-12 NOTE — Telephone Encounter (Signed)
    Patient requesting medication for cold sore on lip.   Pharmacy [id number] [place], [place]

## 2020-09-13 MED ORDER — VALACYCLOVIR HCL 1 G PO TABS
1000.0000 mg | ORAL_TABLET | Freq: Two times a day (BID) | ORAL | 5 refills | Status: DC | PRN
Start: 2020-09-13 — End: 2022-02-01

## 2020-09-13 NOTE — Telephone Encounter (Signed)
Patient not reached, voicemail was not left. Called to notify him that his request has been sent in.

## 2020-09-13 NOTE — Telephone Encounter (Signed)
Ok sent to [address]

## 2020-09-15 DIAGNOSIS — Z20822 Contact with and (suspected) exposure to covid-19: Secondary | ICD-10-CM | POA: Diagnosis not present

## 2020-09-20 ENCOUNTER — Encounter: Payer: Self-pay | Admitting: Internal Medicine

## 2020-09-20 DIAGNOSIS — D485 Neoplasm of uncertain behavior of skin: Secondary | ICD-10-CM | POA: Diagnosis not present

## 2020-09-20 DIAGNOSIS — L820 Inflamed seborrheic keratosis: Secondary | ICD-10-CM | POA: Diagnosis not present

## 2020-09-20 DIAGNOSIS — Z85828 Personal history of other malignant neoplasm of skin: Secondary | ICD-10-CM | POA: Diagnosis not present

## 2020-09-20 DIAGNOSIS — L570 Actinic keratosis: Secondary | ICD-10-CM | POA: Diagnosis not present

## 2020-09-20 DIAGNOSIS — D044 Carcinoma in situ of skin of scalp and neck: Secondary | ICD-10-CM | POA: Diagnosis not present

## 2020-09-25 ENCOUNTER — Encounter: Payer: Self-pay | Admitting: Internal Medicine

## 2020-10-16 ENCOUNTER — Other Ambulatory Visit: Payer: Self-pay | Admitting: Internal Medicine

## 2020-10-16 NOTE — Telephone Encounter (Signed)
Please refill as per office routine med refill policy (all routine meds refilled for 3 mo or monthly per pt preference up to one year from last visit, then month to month grace period for 3 mo, then further med refills will have to be denied)  

## 2020-10-18 DIAGNOSIS — M533 Sacrococcygeal disorders, not elsewhere classified: Secondary | ICD-10-CM | POA: Diagnosis not present

## 2020-10-26 ENCOUNTER — Other Ambulatory Visit: Payer: Self-pay | Admitting: Internal Medicine

## 2020-10-26 NOTE — Telephone Encounter (Signed)
Please refill as per office routine med refill policy (all routine meds refilled for 3 mo or monthly per pt preference up to one year from last visit, then month to month grace period for 3 mo, then further med refills will have to be denied)  

## 2020-10-31 ENCOUNTER — Other Ambulatory Visit: Payer: Self-pay | Admitting: Internal Medicine

## 2020-10-31 NOTE — Telephone Encounter (Signed)
Please refill as per office routine med refill policy (all routine meds refilled for 3 mo or monthly per pt preference up to one year from last visit, then month to month grace period for 3 mo, then further med refills will have to be denied)  

## 2020-11-06 ENCOUNTER — Encounter: Payer: Self-pay | Admitting: Internal Medicine

## 2020-11-10 ENCOUNTER — Other Ambulatory Visit: Payer: Self-pay | Admitting: Internal Medicine

## 2020-11-10 NOTE — Telephone Encounter (Signed)
Please refill as per office routine med refill policy (all routine meds refilled for 3 mo or monthly per pt preference up to one year from last visit, then month to month grace period for 3 mo, then further med refills will have to be denied)  

## 2020-12-03 ENCOUNTER — Other Ambulatory Visit: Payer: Self-pay | Admitting: Adult Health

## 2020-12-09 ENCOUNTER — Other Ambulatory Visit: Payer: Self-pay | Admitting: Internal Medicine

## 2020-12-10 NOTE — Telephone Encounter (Signed)
Please refill as per office routine med refill policy (all routine meds refilled for 3 mo or monthly per pt preference up to one year from last visit, then month to month grace period for 3 mo, then further med refills will have to be denied)  

## 2020-12-15 ENCOUNTER — Other Ambulatory Visit: Payer: Self-pay | Admitting: Internal Medicine

## 2020-12-16 NOTE — Telephone Encounter (Signed)
pletal refilled to mail in pharmacy  Please let pt know - due for rov for further refills as this would have to be the last

## 2020-12-18 ENCOUNTER — Other Ambulatory Visit: Payer: Self-pay

## 2020-12-18 ENCOUNTER — Ambulatory Visit (INDEPENDENT_AMBULATORY_CARE_PROVIDER_SITE_OTHER): Payer: Medicare Other | Admitting: Family

## 2020-12-18 ENCOUNTER — Encounter: Payer: Self-pay | Admitting: Family

## 2020-12-18 ENCOUNTER — Ambulatory Visit (INDEPENDENT_AMBULATORY_CARE_PROVIDER_SITE_OTHER): Payer: Medicare Other

## 2020-12-18 VITALS — BP 104/60 | HR 77 | Temp 98.2°F | Ht 73.0 in | Wt 267.0 lb

## 2020-12-18 DIAGNOSIS — M545 Low back pain, unspecified: Secondary | ICD-10-CM

## 2020-12-18 DIAGNOSIS — G8929 Other chronic pain: Secondary | ICD-10-CM

## 2020-12-18 MED ORDER — METHOCARBAMOL 500 MG PO TABS: 500.0000 mg | ORAL_TABLET | Freq: Three times a day (TID) | ORAL | 0 refills | Status: DC | PRN

## 2020-12-18 NOTE — Progress Notes (Signed)
Allen Myers is a 75 y.o. male with the following history as recorded in EpicCare:  Patient Active Problem List   Diagnosis Date Noted  . Diabetes mellitus type 2, noninsulin dependent ([place]) 03/19/2020  . Hypertensive heart disease 03/19/2020  . History of kidney stones 03/19/2020  . Melanoma ([place]) 12/10/2019  . Upper respiratory infection 07/15/2019  . Chest pain 06/09/2019  . Degenerative arthritis of knee, bilateral 06/16/2018  . Claudication ([place]) 05/28/2018  . History of subdural hematoma 02/10/2018  . RTI (respiratory tract infection) 01/20/2018  . Seasonal allergic rhinitis due to pollen 01/20/2018  . Rotator cuff syndrome of right shoulder 03/04/2016  . Avulsion of right elbow 03/04/2016  . Right lateral epicondylitis 03/04/2016  . Left wrist pain 10/18/2015  . Skin tear of elbow without complication 04/06/7627  . Abrasions of multiple sites 10/18/2015  . Fall at home 10/18/2015  . HLD (hyperlipidemia) 04/24/2015  . Preventative health care 03/29/2015  . Bilateral shoulder pain 03/29/2015  . Abdominal aortic aneurysm ([place]) 12/30/2014  . Congestive dilated cardiomyopathy ([place]) 12/30/2014  . Essential hypertension 11/23/2014  . SDH (subdural hematoma) ([place]) 09/06/2014  . Subdural hematoma ([place]) 09/06/2014  . CAD (coronary artery disease), native coronary artery 05/11/2014  . Obesity (BMI 30.0-34.9) 04/04/2014  . Seizure ([place]) 04/02/2014  . Syncope 04/02/2014  . SAH (subarachnoid hemorrhage) ([place]) 04/01/2014  . OSA (obstructive sleep apnea) 07/22/2013  . Cough 11/03/2007  . Hypothyroidism 05/07/2007    Current Outpatient Medications  Medication Sig Dispense Refill  . Ascorbic Acid (VITAMIN C PO) Take 1 tablet by mouth.    Marland Kitchen aspirin EC 81 MG tablet Take 81 mg by mouth daily.    Marland Kitchen atorvastatin (LIPITOR) 80 MG tablet TAKE 1 TABLET DAILY 30 tablet 0  . carvedilol (COREG) 3.125 MG tablet TAKE 1 TABLET TWICE A DAY  WITH MEALS 180 tablet 1  . cilostazol (PLETAL) 100 MG  tablet TAKE 1 TABLET TWICE A DAY 180 tablet 0  . levETIRAcetam (KEPPRA) 1000 MG tablet TAKE 1 TABLET TWICE A DAY 180 tablet 3  . levothyroxine (SYNTHROID) 137 MCG tablet TAKE 1 TABLET DAILY BEFORE BREAKFAST 90 tablet 3  . lisinopril (ZESTRIL) 2.5 MG tablet TAKE 1 TABLET DAILY 90 tablet 1  . metFORMIN (GLUCOPHAGE) 1000 MG tablet TAKE 1 TABLET TWICE DAILY  WITH MEALS 180 tablet 1  . methocarbamol (ROBAXIN) 500 MG tablet Take 1 tablet (500 mg total) by mouth every 8 (eight) hours as needed. 30 tablet 0  . nitroGLYCERIN (NITROSTAT) 0.4 MG SL tablet PLACE 1 TABLET UNDER THE   TONGUE AND ALLOW TO        DISSOLVE EVERY 5 MINUTES ASNEEDED FOR CHEST PAIN 25 tablet 12  . omeprazole (PRILOSEC) 40 MG capsule TAKE 1 CAPSULE DAILY 90 capsule 2  . ONE TOUCH ULTRA TEST test strip USE TO CHECK BLOOD SUGAR TWICE A DAY AS DIRECTED 100 each 0  . pioglitazone (ACTOS) 45 MG tablet TAKE 1 TABLET DAILY 90 tablet 3  . TRADJENTA 5 MG TABS tablet TAKE 1 TABLET DAILY 90 tablet 1  . valACYclovir (VALTREX) 1000 MG tablet Take 1 tablet (1,000 mg total) by mouth 2 (two) times daily as needed. 20 tablet 5  . vitamin B-12 (CYANOCOBALAMIN) 1000 MCG tablet Take 1 tablet (1,000 mcg total) by mouth daily. 90 tablet 1  . meloxicam (MOBIC) 15 MG tablet Take 15 mg by mouth daily.     No current facility-administered medications for this visit.    Allergies: Patient has no  known allergies.  Past Medical History:  Diagnosis Date  . Allergy   . Arthritis   . CAD (coronary artery disease)   . Erectile dysfunction   . History of colon polyps   . History of diverticulitis   . HYPERTENSION   . Hypothyroidism   . Kidney stones    "passed it"  . Melanoma ([place]) 12/10/2019  . Peripheral arterial disease ([place])   . SAH (subarachnoid hemorrhage) ([place])    Following syncopal episode  . SDH (subdural hematoma) ([facility])    Following syncopal episode  . Seizures ([place])    08/2014 - controlled  . Sleep apnea    "mild"  does not use CPAP  .  Sleep apnea in adult   . Type II diabetes mellitus ([place])    type 2    Past Surgical History:  Procedure Laterality Date  . COLONOSCOPY  2014   polyp  . CORONARY ANGIOPLASTY WITH STENT PLACEMENT  05/11/2014   "1"  . CRANIOTOMY Left 09/12/2014   Procedure: CRANIOTOMY HEMATOMA EVACUATION SUBDURAL;  Surgeon: Erline Levine, MD;  Location: [facility];  Service: Neurosurgery;  Laterality: Left;  . FRACTIONAL FLOW RESERVE WIRE  05/11/2014   Procedure: FRACTIONAL FLOW RESERVE WIRE;  Surgeon: [name], MD;  Location: [facility] CATH LAB;  Service: Cardiovascular;;  . LEFT HEART CATHETERIZATION WITH CORONARY ANGIOGRAM N/A 05/11/2014   Procedure: LEFT HEART CATHETERIZATION WITH CORONARY ANGIOGRAM;  Surgeon: [name], MD;  Location: [facility] CATH LAB;  Service: Cardiovascular;  Laterality: N/A;  . POLYPECTOMY      Family History  Problem Relation Age of Onset  . Lung cancer Father        dad was a smoker  . Heart disease Father   . AAA (abdominal aortic aneurysm) Father   . Heart disease Mother   . Ataxia Maternal Grandmother   . Dementia Maternal Grandmother   . Aplastic anemia Sister   . Prostate cancer Brother   . Lung cancer Cousin   . Colon cancer Neg Hx   . Rectal cancer Neg Hx   . Stomach cancer Neg Hx   . Colon polyps Neg Hx   . Esophageal cancer Neg Hx     Social History   Tobacco Use  . Smoking status: Former Smoker    Packs/day: 1.00    Years: 30.00    Pack years: 30.00    Types: Cigarettes    Quit date: 10/15/1983    Years since quitting: 37.2  . Smokeless tobacco: Never Used  Substance Use Topics  . Alcohol use: No    Alcohol/week: 0.0 standard drinks    Subjective:  Known history of chronic low back pain; notes that low back pain has been flared up for the past few weeks after shoveling snow; notes that movement is limited- can't easily move side to side; fell on Saturday while working in his yard and notes that symptoms were aggravated; fall was due to garbage  can rolling out from under him- he did not collapse or fall because the back gave out on him.  Has been trying to ice the back and thinks it may be helping some; Using Mobic with some relief;  Would like to go back to Dr. Tamala Julian (sports medicine); last saw him in 2019;     Objective:  Vitals:   12/18/20 1101  BP: 104/60  Pulse: 77  Temp: 98.2 F (36.8 C)  TempSrc: Oral  SpO2: 93%  Weight: 267 lb (121.1  kg)  Height: 6\' 1"  (1.854 m)    General: Well developed, well nourished, in no acute distress  Skin : Warm and dry.  Head: Normocephalic and atraumatic  Lungs: Respirations unlabored;  Musculoskeletal: No deformities; no active joint inflammation  Extremities: No edema, cyanosis, clubbing  Vessels: Symmetric bilaterally  Neurologic: Alert and oriented; speech intact; face symmetrical; moves all extremities well; CNII-XII intact without focal deficit   Assessment:  1. Chronic low back pain without sciatica, unspecified back pain laterality     Plan:  Update lumbar X-ray today; continue Mobic daily; add Robaxin 500 mg tid prn; refer to sports medicine for further evaluation per patient request.   This visit occurred during the SARS-CoV-2 public health emergency.  Safety protocols were in place, including screening questions prior to the visit, additional usage of staff PPE, and extensive cleaning of exam room while observing appropriate contact time as indicated for disinfecting solutions.     No follow-ups on file.  Orders Placed This Encounter  Procedures  . DG Lumbar Spine Complete    Standing Status:   Future    Number of Occurrences:   1    Standing Expiration Date:   12/18/2021    Order Specific Question:   Reason for Exam (SYMPTOM  OR DIAGNOSIS REQUIRED)    Answer:   low back pain    Order Specific Question:   Preferred imaging location?    Answer:   [facility]    Requested Prescriptions   Signed Prescriptions Disp Refills  . methocarbamol (ROBAXIN) 500  MG tablet 30 tablet 0    Sig: Take 1 tablet (500 mg total) by mouth every 8 (eight) hours as needed.

## 2020-12-19 ENCOUNTER — Other Ambulatory Visit: Payer: Self-pay

## 2020-12-19 ENCOUNTER — Telehealth: Payer: Self-pay | Admitting: Internal Medicine

## 2020-12-19 ENCOUNTER — Other Ambulatory Visit: Payer: Self-pay | Admitting: Family

## 2020-12-19 DIAGNOSIS — I714 Abdominal aortic aneurysm, without rupture, unspecified: Secondary | ICD-10-CM

## 2020-12-19 MED ORDER — LINAGLIPTIN 5 MG PO TABS: 5.0000 mg | ORAL_TABLET | Freq: Every day | ORAL | 1 refills | Status: DC

## 2020-12-19 NOTE — Telephone Encounter (Signed)
1.Medication Requested: levETIRAcetam (KEPPRA) 1000 MG tablet  TRADJENTA 5 MG TABS tablet    2. Pharmacy (Name, Street, [place]): [place] [place] to Registered [facility]  3. On Med List: yes   4. Last Visit with PCP:2.26.21  5. Next visit date with PCP:3.31.22   Agent: Please be advised that RX refills may take up to 3 business days. We ask that you follow-up with your pharmacy.

## 2020-12-19 NOTE — Telephone Encounter (Signed)
The Keppra has been managed by his neurologist. Dr. Jenny Reichmann has not been writing. We will not be able to fill that for him.

## 2020-12-19 NOTE — Telephone Encounter (Signed)
Patient notified by voice mail that refill for the keppra is declined and should be filled by the neurologist.

## 2020-12-20 ENCOUNTER — Other Ambulatory Visit: Payer: Self-pay | Admitting: Family

## 2020-12-20 DIAGNOSIS — N200 Calculus of kidney: Secondary | ICD-10-CM

## 2020-12-20 NOTE — Progress Notes (Signed)
Allen Myers [address] [place] Phone: [phone] Subjective:   I Allen Myers am serving as a Education administrator for Dr. Hulan Myers.  This visit occurred during the SARS-CoV-2 public health emergency.  Safety protocols were in place, including screening questions prior to the visit, additional usage of staff PPE, and extensive cleaning of exam room while observing appropriate contact time as indicated for disinfecting solutions.   I'm seeing this patient by the request  of:  Allen Borg, MD  CC: Low back pain  [id number]  Allen Myers is a 75 y.o. male coming in with complaint of back pain. Last seen in 2019 for knee pain. Patient states he has right sided low back pain. States the pain is chronic and escalated on Saturday. Has been icing since then and it is much better today. States everything increases pain. Thighs have been experiencing numbness that has resolved. Was given a muscle relaxer on Monday. Pain kept him up at night. Pain radiates into the hip as well with laying down. 9.5/10 at its worse. States Saturday he reached 9.5 where as today he is at a 3.  Still having some difficulty with the right-sided pain.  States that it only is when he does certain movements such as trying to put on his socks.  Xray lumbar 12/18/2020 IMPRESSION: 1. Left-sided nephrolithiasis. 2. Apparent interval increase in size of the abdominal aortic aneurysm measuring 4.4 cm (previously 3.5 cm on prior CT). Further evaluation with ultrasound is recommended. 3. Multilevel degenerative changes of the lumbar spine, greatest at the L4-L5 level. These are relatively stable from prior x-ray.   Patient's previous abdominal aorta ultrasound was done on May 11 it was 3.5 cm.    Past Medical History:  Diagnosis Date  . Allergy   . Arthritis   . CAD (coronary artery disease)   . Erectile dysfunction   . History of colon polyps   . History of diverticulitis    . HYPERTENSION   . Hypothyroidism   . Kidney stones    "passed it"  . Melanoma ([place]) 12/10/2019  . Peripheral arterial disease ([place])   . SAH (subarachnoid hemorrhage) ([place])    Following syncopal episode  . SDH (subdural hematoma) ([facility])    Following syncopal episode  . Seizures ([place])    08/2014 - controlled  . Sleep apnea    "mild"  does not use CPAP  . Sleep apnea in adult   . Type II diabetes mellitus ([place])    type 2   Past Surgical History:  Procedure Laterality Date  . COLONOSCOPY  2014   polyp  . CORONARY ANGIOPLASTY WITH STENT PLACEMENT  05/11/2014   "1"  . CRANIOTOMY Left 09/12/2014   Procedure: CRANIOTOMY HEMATOMA EVACUATION SUBDURAL;  Surgeon: Allen Levine, MD;  Location: [facility];  Service: Neurosurgery;  Laterality: Left;  . FRACTIONAL FLOW RESERVE WIRE  05/11/2014   Procedure: FRACTIONAL FLOW RESERVE WIRE;  Surgeon: Allen Grooms, MD;  Location: [facility] CATH LAB;  Service: Cardiovascular;;  . LEFT HEART CATHETERIZATION WITH CORONARY ANGIOGRAM N/A 05/11/2014   Procedure: LEFT HEART CATHETERIZATION WITH CORONARY ANGIOGRAM;  Surgeon: Allen Grooms, MD;  Location[facility] C[facility] LAB;  Service: Cardiovascular;  Laterality: N/A;  . POLYPECTOMY     Social History   Socioeconomic History  . Marital status: Married    Spouse name: Allen Myers  . Number of children: 3  . Years of education: 77  . Highest  education level: Not on file  Occupational History  . 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 Occupation: [phone] (CELL)    Employer: [employer]  Tobacco Use  . Smoking status: Former Smoker    Packs/day: 1.00    Years: 30.00    Pack years: 30.00    Types: Cigarettes    Quit date: 10/15/1983    Years since quitting: 37.2  . Smokeless tobacco: Never Used  Vaping Use  . Vaping Use: Never used  Substance and Sexual Activity  . Alcohol use: No    Alcohol/week: 0.0 standard drinks   . Drug use: No  . Sexual activity: Yes  Other Topics Concern  . Not on file  Social History Narrative   Patient is married with 3 daughters 77, 33, 32 and 2 grandchildren.   Patient is right handed.   Patient has hs education.   Patient drinks decaf, and soda occasionally.   Social Determinants of Health   Financial Resource Strain: Not on file  Food Insecurity: Not on file  Transportation Needs: Not on file  Physical Activity: Not on file  Stress: Not on file  Social Connections: Not on file   No Known Allergies Family History  Problem Relation Age of Onset  . Lung cancer Father        dad was a smoker  . Heart disease Father   . AAA (abdominal aortic aneurysm) Father   . Heart disease Mother   . Ataxia Maternal Grandmother   . Dementia Maternal Grandmother   . Aplastic anemia Sister   . Prostate cancer Brother   . Lung cancer Cousin   . Colon cancer Neg Hx   . Rectal cancer Neg Hx   . Stomach cancer Neg Hx   . Colon polyps Neg Hx   . Esophageal cancer Neg Hx     Current Outpatient Medications (Endocrine & Metabolic):  .  levothyroxine (SYNTHROID) 137 MCG tablet, TAKE 1 TABLET DAILY BEFORE BREAKFAST .  linagliptin (TRADJENTA) 5 MG TABS tablet, Take 1 tablet (5 mg total) by mouth daily. .  metFORMIN (GLUCOPHAGE) 1000 MG tablet, TAKE 1 TABLET TWICE DAILY  WITH MEALS .  pioglitazone (ACTOS) 45 MG tablet, TAKE 1 TABLET DAILY  Current Outpatient Medications (Cardiovascular):  .  atorvastatin (LIPITOR) 80 MG tablet, TAKE 1 TABLET DAILY .  carvedilol (COREG) 3.125 MG tablet, TAKE 1 TABLET TWICE A DAY  WITH MEALS .  lisinopril (ZESTRIL) 2.5 MG tablet, TAKE 1 TABLET DAILY .  nitroGLYCERIN (NITROSTAT) 0.4 MG SL tablet, PLACE 1 TABLET UNDER THE   TONGUE AND ALLOW TO        DISSOLVE EVERY 5 MINUTES ASNEEDED FOR CHEST PAIN   Current Outpatient Medications (Analgesics):  .  aspirin EC 81 MG tablet, Take 81 mg by mouth daily. .  meloxicam (MOBIC) 15 MG tablet, Take 15 mg by  mouth daily.  Current Outpatient Medications (Hematological):  .  cilostazol (PLETAL) 100 MG tablet, TAKE 1 TABLET TWICE A DAY .  vitamin B-12 (CYANOCOBALAMIN) 1000 MCG tablet, Take 1 tablet (1,000 mcg total) by mouth daily.  Current Outpatient Medications (Other):  Marland Kitchen  Ascorbic Acid (VITAMIN C PO), Take 1 tablet by mouth. .  levETIRAcetam (KEPPRA) 1000 MG tablet, TAKE 1 TABLET TWICE A DAY .  methocarbamol (ROBAXIN) 500 MG tablet, Take 1 tablet (500 mg total) by mouth every 8 (eight) hours as needed. Marland Kitchen  omeprazole (PRILOSEC) 40  MG capsule, TAKE 1 CAPSULE DAILY .  ONE TOUCH ULTRA TEST test strip, USE TO CHECK BLOOD SUGAR TWICE A DAY AS DIRECTED .  valACYclovir (VALTREX) 1000 MG tablet, Take 1 tablet (1,000 mg total) by mouth 2 (two) times daily as needed.   Reviewed prior external information including notes and imaging from  primary care provider As well as notes that were available from care everywhere and other healthcare systems.  Past medical history, social, surgical and family history all reviewed in electronic medical record.  No pertanent information unless stated regarding to the chief complaint.   Review of Systems:  No headache, visual changes, nausea, vomiting, diarrhea, constipation, dizziness, abdominal pain, skin rash, fevers, chills, night sweats, weight loss, swollen lymph nodes, body aches, joint swelling, chest pain, shortness of breath, mood changes. POSITIVE muscle aches  Objective  Blood pressure 106/60, pulse 80, height 6\' 1"  (1.854 m), weight 270 lb (122.5 kg), SpO2 95 %.   General: No apparent distress alert and oriented x3 mood and affect normal, dressed appropriately.  HEENT: Pupils equal, extraocular movements intact  Respiratory: Patient's speak in full sentences and does not appear short of breath  Cardiovascular: Trace lower extremity edema, non tender, no erythema  Gait normal with good balance and coordination.  Abdominal exam does not show any  worsening pain today.  Attempted to palpate aorta but somewhat difficult secondary to patient's body habitus MSK: Arthritic changes of multiple joints noted. Patient's low back exam does have some tenderness to palpation over the right sacroiliac joint.  Mild positive Corky Sox.  Pain over the right gluteal tendon more laterally.  Pain over the greater trochanteric area as well.  No pain with internal rotation of the hip noted.  Negative straight leg test.  [id number]; 15 additional minutes spent for Therapeutic exercises as stated in above notes.  This included exercises focusing on stretching, strengthening, with significant focus on eccentric aspects.   Long term goals include an improvement in range of motion, strength, endurance as well as avoiding reinjury. Patient's frequency would include in 1-2 times a day, 3-5 times a week for a duration of 6-12 weeks. Low back exercises that included:  Pelvic tilt/bracing instruction to focus on control of the pelvic girdle and lower abdominal muscles  Glute strengthening exercises, focusing on proper firing of the glutes without engaging the low back muscles Proper stretching techniques for maximum relief for the hamstrings, hip flexors, low back and some rotation where tolerated   Proper technique shown and discussed handout in great detail with ATC.  All questions were discussed and answered.     Impression and Recommendations:     The above documentation has been reviewed and is accurate and complete Lyndal Pulley, DO

## 2020-12-21 ENCOUNTER — Other Ambulatory Visit: Payer: Self-pay

## 2020-12-21 ENCOUNTER — Ambulatory Visit (INDEPENDENT_AMBULATORY_CARE_PROVIDER_SITE_OTHER): Payer: Medicare Other | Admitting: Family Medicine

## 2020-12-21 ENCOUNTER — Encounter: Payer: Self-pay | Admitting: Family Medicine

## 2020-12-21 DIAGNOSIS — M545 Low back pain, unspecified: Secondary | ICD-10-CM | POA: Insufficient documentation

## 2020-12-21 DIAGNOSIS — I714 Abdominal aortic aneurysm, without rupture, unspecified: Secondary | ICD-10-CM

## 2020-12-21 DIAGNOSIS — M5440 Lumbago with sciatica, unspecified side: Secondary | ICD-10-CM

## 2020-12-21 NOTE — Patient Instructions (Addendum)
Good to see you Likely arthritis in the back got angry Take muscle relaxer at night for 2-3 nights then as needed Low back and scapular exercises  If back pain gets significantly worse and any abdominal pain you need to be seen immediately or call 911 I think ultrasound of abdomen is a good idea ask Dr. Stanford Breed if waiting is ok See me again in 3-4 weeks

## 2020-12-21 NOTE — Assessment & Plan Note (Signed)
Significant change in the last 10 months from 3.5 cm to 4.4 cm but this is comparing an ultrasound to the CT scan.  Patient is scheduled for another ultrasound in 2 weeks.  No abdominal pain at this time.  Discussed though that if any abdominal pain, severe back pain to seek medical attention immediately.

## 2020-12-21 NOTE — Assessment & Plan Note (Signed)
Patient initially did have acute low back pain with radicular symptoms.  Had difficulty with walking and severe pain.  Patient does have a history of the abdominal aortic aneurysm that there was was not associated with any type of abdominal pain or anything midline.  Patient's vitals are stable.  Patient is already scheduled for the ultrasound and 2 weeks.  Discussed with patient in great length about this likely being harmed exacerbation of the underlying arthritis that was noted on CT scan at L4-L5 with potential some mild radicular symptoms initially.  Now seems to be more muscular in nature.  Given home exercises.  Patient would like to try to do them on his own first.  Patient is neurovascularly intact distally.  Warned patient know that if any association with any abdominal pain, worsening back pain to seek medical attention immediately.  Patient does have meloxicam that has been given to him by his primary care provider.  We discussed using this only sparingly if possible discussed more topical anti-inflammatories or over-the-counter topical may be more beneficial as well as icing regimen.  Follow-up with me again in 3 weeks or can call if the pain seems to be worsening and we can consider MRI if necessary

## 2020-12-25 ENCOUNTER — Telehealth: Payer: Self-pay | Admitting: Internal Medicine

## 2020-12-25 NOTE — Telephone Encounter (Signed)
[facility] ,  per their office they will call patient

## 2020-12-25 NOTE — Telephone Encounter (Signed)
Patient calling, states he called [facility] and they have not received a referral for him. Im unsure if its still being processed so I let him know I will get someone to look into it for him.

## 2020-12-26 DIAGNOSIS — N281 Cyst of kidney, acquired: Secondary | ICD-10-CM | POA: Diagnosis not present

## 2020-12-26 DIAGNOSIS — N200 Calculus of kidney: Secondary | ICD-10-CM | POA: Diagnosis not present

## 2020-12-28 ENCOUNTER — Telehealth: Payer: Self-pay | Admitting: Cardiology

## 2020-12-28 NOTE — Telephone Encounter (Signed)
   [facility] Pre-operative Risk Assessment    HEARTCARE STAFF: - Please ensure there is not already an duplicate clearance open for this procedure. - Under Visit Info/Reason for Call, type in Other and utilize the format Clearance MM/DD/YY or Clearance TBD. Do not use dashes or single digits. - If request is for dental extraction, please clarify the # of teeth to be extracted.  Request for surgical clearance:  1. What type of surgery is being performed? Extracorporeal Shockwave lithotripsy  2. When is this surgery scheduled? TBD but needs to be ASAP  3. What type of clearance is required (medical clearance vs. Pharmacy clearance to hold med vs. Both)? both  4. Are there any medications that need to be held prior to surgery and how long? Aspirin held 72 hours prior to procedure  5. Practice name and name of physician performing surgery? Dr. Ellison Hughs at [facility]   6. What is the office phone number? [place]   7.   What is the office fax number? [phone]  8.   Anesthesia type (None, local, MAC, general) ? Local    Allen Myers 12/28/2020, 1:53 PM  _________________________________________________________________   (provider comments below)

## 2020-12-28 NOTE — Telephone Encounter (Signed)
   Primary Cardiologist: Kirk Ruths, MD  Chart reviewed as part of pre-operative protocol coverage. Patient was contacted 12/28/2020 in reference to pre-operative risk assessment for pending surgery as outlined below.  Allen Myers was last seen on 04/24/2020 by Dr. Stanford Breed.  Since that day, Allen Myers has done fine from a cardiac standpoint. He is limited in activity by back pain, though able to complete 4 METs without anginal complaints.   Therefore, based on ACC/AHA guidelines, the patient would be at acceptable risk for the planned procedure without further cardiovascular testing.   The patient was advised that if he develops new symptoms prior to surgery to contact our office to arrange for a follow-up visit, and he verbalized understanding.  Okay to hold aspirin 3 days prior to his lithotripsy procedure if needed with plans to restart as soon as he is cleared to do so by his urologist.    I will route this recommendation to the requesting party via [facility] fax function and remove from pre-op pool. Please call with questions.  Abigail Butts, PA-C 12/28/2020, 2:50 PM

## 2020-12-29 ENCOUNTER — Ambulatory Visit (HOSPITAL_COMMUNITY)
Admission: RE | Admit: 2020-12-29 | Discharge: 2020-12-29 | Disposition: A | Discharge status: Home / Self Care | Payer: Medicare Other | Source: Ambulatory Visit | Attending: Cardiology | Admitting: Cardiology

## 2020-12-29 ENCOUNTER — Other Ambulatory Visit: Payer: Self-pay

## 2020-12-29 ENCOUNTER — Other Ambulatory Visit: Payer: Self-pay | Admitting: Urology

## 2020-12-29 ENCOUNTER — Other Ambulatory Visit (HOSPITAL_COMMUNITY): Payer: Self-pay | Admitting: Family

## 2020-12-29 DIAGNOSIS — I714 Abdominal aortic aneurysm, without rupture, unspecified: Secondary | ICD-10-CM

## 2020-12-29 DIAGNOSIS — N200 Calculus of kidney: Secondary | ICD-10-CM

## 2021-01-01 ENCOUNTER — Other Ambulatory Visit: Payer: Self-pay | Admitting: Internal Medicine

## 2021-01-01 NOTE — Telephone Encounter (Signed)
Please refill as per office routine med refill policy (all routine meds refilled for 3 mo or monthly per pt preference up to one year from last visit, then month to month grace period for 3 mo, then further med refills will have to be denied)  

## 2021-01-04 ENCOUNTER — Other Ambulatory Visit (HOSPITAL_COMMUNITY)
Admission: RE | Admit: 2021-01-04 | Discharge: 2021-01-04 | Disposition: A | Discharge status: Home / Self Care | Payer: Medicare Other | Source: Ambulatory Visit | Attending: Urology | Admitting: Urology

## 2021-01-04 ENCOUNTER — Other Ambulatory Visit (HOSPITAL_COMMUNITY): Payer: Medicare Other

## 2021-01-04 DIAGNOSIS — Z01812 Encounter for preprocedural laboratory examination: Secondary | ICD-10-CM | POA: Insufficient documentation

## 2021-01-04 DIAGNOSIS — Z20822 Contact with and (suspected) exposure to covid-19: Secondary | ICD-10-CM | POA: Insufficient documentation

## 2021-01-04 LAB — SARS CORONAVIRUS 2 (TAT 6-24 HRS): SARS Coronavirus 2: NEGATIVE

## 2021-01-05 NOTE — H&P (Signed)
Office Visit Report     12/26/2020   --------------------------------------------------------------------------------   [name]  MRN: [id number]  DOB: Sep 02, 1946, 75 year old Male  SSN: [id number]   PRIMARY CARE:    REFERRING:  Jodi Mourning, NP  PROVIDER:  Ellison Hughs, M.D.  LOCATION:  [facility] [phone]     --------------------------------------------------------------------------------   CC/HPI: Kidney stone   Mr. Wailes is a 75 year old male referred by the above provider after he was found to have a 1.2 cm left renal stone during an evaluation for ongoing RIGHT sided low back pain that has since resolved.   -Remote hx of a kidney stone that he passed without surgery  -He denies flank pain, dysuria or hematuria.  -No prior GU surgery, trauma or infections   Cardiologist: Kirk Ruths, MD     ALLERGIES: NKDA    MEDICATIONS: Lisinopril 2.5 mg tablet  Metformin Hcl 1,000 mg tablet  Omeprazole 40 mg capsule,delayed release  Actos 30 mg tablet  Aspirin Ec 81 mg tablet, delayed release  Atorvastatin Calcium 80 mg tablet  Cilotazol Pletal  Coreg 3.125 mg tablet  Keppra 1,000 mg tablet  Levothyroxine 137 mcg capsule  Meloxicam 15 mg tablet  Methocarbamol 500 mg tablet  Tradjenta 5 mg tablet  Valtrex 1,000 mg tablet  Vitamin B12  Vitamin C     GU PSH: None     PSH Notes: crainectomy   NON-GU PSH: Cardiac Stent Placement     GU PMH: None     PMH Notes: seizures   NON-GU PMH: Anxiety Arthritis Diabetes Type 2 Encounter for general adult medical examination without abnormal findings, Encounter for preventive health examination Heart disease, unspecified Hypertension Hypothyroidism Sleep Apnea    FAMILY HISTORY: 3 daughters - Daughter Cancer - Father Diabetes - Mother Heart Disease - Mother   SOCIAL HISTORY: Marital Status: Married Preferred Language: English; Ethnicity: Not Hispanic Or Latino; Race:  White Current Smoking Status: Patient has never smoked.   Tobacco Use Assessment Completed: Used Tobacco in last 30 days? Has never drank.  Drinks 2 caffeinated drinks per day.    REVIEW OF SYSTEMS:    GU Review Male:   Patient reports get up at night to urinate and leakage of urine. Patient denies stream starts and stops, have to strain to urinate , penile pain, hard to postpone urination, trouble starting your stream, burning/ pain with urination, frequent urination, and erection problems.  Gastrointestinal (Upper):   Patient denies nausea, vomiting, and indigestion/ heartburn.  Gastrointestinal (Lower):   Patient denies diarrhea and constipation.  Constitutional:   Patient denies fever, night sweats, weight loss, and fatigue.  Skin:   Patient denies skin rash/ lesion and itching.  Eyes:   Patient denies blurred vision and double vision.  Ears/ Nose/ Throat:   Patient denies sore throat and sinus problems.  Hematologic/Lymphatic:   Patient denies swollen glands and easy bruising.  Cardiovascular:   Patient denies leg swelling and chest pains.  Respiratory:   Patient denies cough and shortness of breath.  Endocrine:   Patient denies excessive thirst.  Musculoskeletal:   Patient denies back pain and joint pain.  Neurological:   Patient denies headaches and dizziness.  Psychologic:   Patient denies depression and anxiety.   VITAL SIGNS:      12/26/2020 08:27 AM  Weight 260 lb / 117.93 kg  Height 73 in / 185.42 cm  BP 119/68 mmHg  Pulse 72 /min  Temperature 97.1 F /  36.1 C  BMI 34.3 kg/m   GU PHYSICAL EXAMINATION:    Anus and Perineum: No hemorrhoids. No anal stenosis. No rectal fissure, no anal fissure. No edema, no dimple, no perineal tenderness, no anal tenderness.  Scrotum: No lesions. No edema. No cysts. No warts.  Epididymides: Right: no spermatocele, no masses, no cysts, no tenderness, no induration, no enlargement. Left: no spermatocele, no masses, no cysts, no tenderness,  no induration, no enlargement.  Testes: No tenderness, no swelling, no enlargement left testes. No tenderness, no swelling, no enlargement right testes. Normal location left testes. Normal location right testes. No mass, no cyst, no varicocele, no hydrocele left testes. No mass, no cyst, no varicocele, no hydrocele right testes.  Urethral Meatus: Normal size. No lesion, no wart, no discharge, no polyp. Normal location.  Penis: Circumcised, no warts, no cracks. No dorsal Peyronie's plaques, no left corporal Peyronie's plaques, no right corporal Peyronie's plaques, no scarring, no warts. No balanitis, no meatal stenosis.  Prostate: 40 gram or 2+ size. Left lobe normal consistency, right lobe normal consistency. Symmetrical lobes. No prostate nodule. Left lobe no tenderness, right lobe no tenderness.  Seminal Vesicles: Nonpalpable.  Sphincter Tone: Normal sphincter. No rectal tenderness. No rectal mass.    MULTI-SYSTEM PHYSICAL EXAMINATION:       Complexity of Data:  Records Review:   Previous Patient Records   02/09/03  PSA  Total PSA 1.72     PROCEDURES:         KUB - 74018  A single view of the abdomen is obtained.      . Patient confirmed No Neulasta OnPro Device.   1.1 cm calcification in noted in the lower portion of the left renal shadow. Possible calcification measuring 5 mm seen in the upper portion of the right renal shadow. There are no calcifications seen along the expected course of either ureter No calcifications noted within bladder. No boney abnormalities. No abnormal bowel/gas patterns or signs of free air.           Renal Ultrasound - 26333  Right Kidney: Length:13.8 cm Depth:6.8 cm Cortical Width:.9 cm Width:6.5 cm  Left Kidney: Length:12.8 cm Depth:6.2 cm Cortical Width:1.3 cm Width5.6: cm  Left Kidney/Ureter:  There is a large lower pole calc that measures 1.06 cm. There is a mid pole simple appearing para pelvic cyst that measures 3.5 cm.  Right Kidney/Ureter:   There is a mid pole non obstructing calc measuring 5 mm calc  Bladder:  PVR 19.1 ml      . Patient confirmed No Neulasta OnPro Device.           Urinalysis Dipstick Dipstick Cont'd  Color: Yellow Bilirubin: Neg mg/dL  Appearance: Clear Ketones: Neg mg/dL  Specific Gravity: 1.025 Blood: Neg ery/uL  pH: 5.5 Protein: Neg mg/dL  Glucose: Neg mg/dL Urobilinogen: 0.2 mg/dL    Nitrites: Neg    Leukocyte Esterase: Neg leu/uL    ASSESSMENT:      ICD-10 Details  1 GU:   Renal calculus - N20.0 Undiagnosed New Problem  2   Renal cyst - N28.1 Left, Undiagnosed New Problem   PLAN:           Orders X-Rays: KUB    Renal Ultrasound          Schedule Return Visit/Planned Activity: Next Available Appointment - Schedule Surgery          Document Letter(s):  Created for Patient: Clinical Summary  Notes:   -KUB today confirms the presence of a 1.1 cm left renal stone and possibly a 5 mm right renal stone.  -RUS shows no evidence of hydronephrosis. Simple left renal cyst noted--no further surveillance needed  -The risks, benefits and alternatives of LEFT ESWL was discussed with the patient. I described the risks which include arrhythmia, kidney contusion, kidney hemorrhage, need for transfusion, back discomfort, flank ecchymosis, flank abrasion, inability to break up stone, inability to pass stone fragments, Steinstrasse, infection associated with obstructing stones, need for different surgical procedure and possible need for repeat shockwave lithotripsy. The patient voices understanding and wishes to proceed.     * Signed by Ellison Hughs, M.D. on 12/27/20 at 7:48 AM (EDT)*

## 2021-01-07 ENCOUNTER — Other Ambulatory Visit: Payer: Self-pay | Admitting: Internal Medicine

## 2021-01-08 ENCOUNTER — Encounter (HOSPITAL_BASED_OUTPATIENT_CLINIC_OR_DEPARTMENT_OTHER): Payer: Self-pay | Admitting: Anesthesiology

## 2021-01-08 ENCOUNTER — Other Ambulatory Visit: Payer: Self-pay

## 2021-01-08 ENCOUNTER — Encounter (HOSPITAL_BASED_OUTPATIENT_CLINIC_OR_DEPARTMENT_OTHER)
Admission: RE | Disposition: A | Discharge status: Home / Self Care | Payer: Self-pay | Source: Home / Self Care | Attending: Urology

## 2021-01-08 ENCOUNTER — Ambulatory Visit (HOSPITAL_COMMUNITY): Payer: Medicare Other

## 2021-01-08 ENCOUNTER — Ambulatory Visit (HOSPITAL_BASED_OUTPATIENT_CLINIC_OR_DEPARTMENT_OTHER)
Admission: RE | Admit: 2021-01-08 | Discharge: 2021-01-08 | Disposition: A | Discharge status: Home / Self Care | Payer: Medicare Other | Attending: Urology | Admitting: Urology

## 2021-01-08 ENCOUNTER — Encounter (HOSPITAL_BASED_OUTPATIENT_CLINIC_OR_DEPARTMENT_OTHER): Payer: Self-pay | Admitting: Urology

## 2021-01-08 DIAGNOSIS — Z7984 Long term (current) use of oral hypoglycemic drugs: Secondary | ICD-10-CM | POA: Insufficient documentation

## 2021-01-08 DIAGNOSIS — E119 Type 2 diabetes mellitus without complications: Secondary | ICD-10-CM | POA: Diagnosis not present

## 2021-01-08 DIAGNOSIS — Z955 Presence of coronary angioplasty implant and graft: Secondary | ICD-10-CM | POA: Insufficient documentation

## 2021-01-08 DIAGNOSIS — M47816 Spondylosis without myelopathy or radiculopathy, lumbar region: Secondary | ICD-10-CM | POA: Diagnosis not present

## 2021-01-08 DIAGNOSIS — Z791 Long term (current) use of non-steroidal anti-inflammatories (NSAID): Secondary | ICD-10-CM | POA: Diagnosis not present

## 2021-01-08 DIAGNOSIS — N200 Calculus of kidney: Secondary | ICD-10-CM

## 2021-01-08 DIAGNOSIS — N281 Cyst of kidney, acquired: Secondary | ICD-10-CM | POA: Insufficient documentation

## 2021-01-08 HISTORY — PX: EXTRACORPOREAL SHOCK WAVE LITHOTRIPSY: SHX1557

## 2021-01-08 LAB — GLUCOSE, CAPILLARY: Glucose-Capillary: 151 mg/dL — ABNORMAL HIGH (ref 70–99)

## 2021-01-08 SURGERY — LITHOTRIPSY, ESWL
Anesthesia: LOCAL | Laterality: Left

## 2021-01-08 MED ORDER — CIPROFLOXACIN HCL 500 MG PO TABS
500.0000 mg | ORAL_TABLET | ORAL | Status: AC
  Administered 2021-01-08: 500 mg via ORAL

## 2021-01-08 MED ORDER — DIPHENHYDRAMINE HCL 25 MG PO CAPS
ORAL_CAPSULE | ORAL | Status: AC
  Filled 2021-01-08: qty 1

## 2021-01-08 MED ORDER — HYDROCODONE-ACETAMINOPHEN 5-325 MG PO TABS: 1.0000 | ORAL_TABLET | Freq: Four times a day (QID) | ORAL | 0 refills | Status: DC | PRN

## 2021-01-08 MED ORDER — TAMSULOSIN HCL 0.4 MG PO CAPS: 0.4000 mg | ORAL_CAPSULE | Freq: Every day | ORAL | 0 refills | Status: DC

## 2021-01-08 MED ORDER — SODIUM CHLORIDE 0.9 % IV SOLN: INTRAVENOUS | Status: DC

## 2021-01-08 MED ORDER — DIAZEPAM 5 MG PO TABS
ORAL_TABLET | ORAL | Status: AC
  Filled 2021-01-08: qty 2

## 2021-01-08 MED ORDER — DIPHENHYDRAMINE HCL 25 MG PO CAPS
25.0000 mg | ORAL_CAPSULE | ORAL | Status: AC
  Administered 2021-01-08: 25 mg via ORAL

## 2021-01-08 MED ORDER — DIAZEPAM 5 MG PO TABS
10.0000 mg | ORAL_TABLET | ORAL | Status: AC
  Administered 2021-01-08: 10 mg via ORAL

## 2021-01-08 MED ORDER — CIPROFLOXACIN HCL 500 MG PO TABS
ORAL_TABLET | ORAL | Status: AC
  Filled 2021-01-08: qty 1

## 2021-01-08 NOTE — Op Note (Signed)
See [facility] operative note scanned into chart. Also because of the size, density, location and other factors that cannot be anticipated I feel this will likely be a staged procedure. This fact supersedes any indication in the scanned [facility] operative note to the contrary.  

## 2021-01-08 NOTE — Interval H&P Note (Signed)
History and Physical Interval Note:  01/08/2021 7:43 AM  Allen Myers  has presented today for surgery, with the diagnosis of LEFT RENAL STONE.  The various methods of treatment have been discussed with the patient and family. After consideration of risks, benefits and other options for treatment, the patient has consented to  Procedure(s): EXTRACORPOREAL SHOCK WAVE LITHOTRIPSY (ESWL) (Left) as a surgical intervention.  The patient's history has been reviewed, patient examined, no change in status, stable for surgery.  I have reviewed the patient's chart and labs.  Questions were answered to the patient's satisfaction.     [employer]

## 2021-01-08 NOTE — Discharge Instructions (Signed)
1. You should strain your urine and collect all fragments and bring them to your follow up appointment.  2. You should take your pain medication as needed.  Please call if your pain is severe to the point that it is not controlled with your pain medication. 3. You should call if you develop fever > 101 or persistent nausea or vomiting. 4. Your doctor may prescribe tamsulosin to take to help facilitate stone passage. 5. You may resume your aspirin and Pletal in 48 hours.

## 2021-01-08 NOTE — Telephone Encounter (Signed)
Please refill as per office routine med refill policy (all routine meds refilled for 3 mo or monthly per pt preference up to one year from last visit, then month to month grace period for 3 mo, then further med refills will have to be denied)  

## 2021-01-09 ENCOUNTER — Encounter (HOSPITAL_BASED_OUTPATIENT_CLINIC_OR_DEPARTMENT_OTHER): Payer: Self-pay | Admitting: Urology

## 2021-01-11 ENCOUNTER — Encounter: Payer: Self-pay | Admitting: Internal Medicine

## 2021-01-11 ENCOUNTER — Other Ambulatory Visit: Payer: Self-pay

## 2021-01-11 ENCOUNTER — Ambulatory Visit (INDEPENDENT_AMBULATORY_CARE_PROVIDER_SITE_OTHER): Payer: Medicare Other | Admitting: Internal Medicine

## 2021-01-11 VITALS — BP 106/68 | HR 73 | Temp 98.3°F | Ht 73.0 in | Wt 260.0 lb

## 2021-01-11 DIAGNOSIS — E559 Vitamin D deficiency, unspecified: Secondary | ICD-10-CM

## 2021-01-11 DIAGNOSIS — N320 Bladder-neck obstruction: Secondary | ICD-10-CM

## 2021-01-11 DIAGNOSIS — E039 Hypothyroidism, unspecified: Secondary | ICD-10-CM

## 2021-01-11 DIAGNOSIS — E538 Deficiency of other specified B group vitamins: Secondary | ICD-10-CM

## 2021-01-11 DIAGNOSIS — I10 Essential (primary) hypertension: Secondary | ICD-10-CM

## 2021-01-11 DIAGNOSIS — E7800 Pure hypercholesterolemia, unspecified: Secondary | ICD-10-CM | POA: Diagnosis not present

## 2021-01-11 DIAGNOSIS — E119 Type 2 diabetes mellitus without complications: Secondary | ICD-10-CM

## 2021-01-11 LAB — URINALYSIS, ROUTINE W REFLEX MICROSCOPIC
Bilirubin Urine: NEGATIVE
Ketones, ur: NEGATIVE
Leukocytes,Ua: NEGATIVE
Nitrite: NEGATIVE
Specific Gravity, Urine: >=1.03 — AB (ref 1.000–1.030)
Total Protein, Urine: 100 — AB
Urine Glucose: NEGATIVE
Urobilinogen, UA: 0.2 (ref 0.0–1.0)
pH: 5.5 (ref 5.0–8.0)

## 2021-01-11 LAB — MICROALBUMIN / CREATININE URINE RATIO
Creatinine,U: 181.4 mg/dL
Microalb Creat Ratio: 13.3 mg/g (ref 0.0–30.0)
Microalb, Ur: 24.2 mg/dL — ABNORMAL HIGH (ref 0.0–1.9)

## 2021-01-11 LAB — CBC WITH DIFFERENTIAL/PLATELET
Basophils Absolute: 0 10*3/uL (ref 0.0–0.1)
Basophils Relative: 0.5 % (ref 0.0–3.0)
Eosinophils Absolute: 0.1 10*3/uL (ref 0.0–0.7)
Eosinophils Relative: 0.9 % (ref 0.0–5.0)
HCT: 40.9 % (ref 39.0–52.0)
Hemoglobin: 13.9 g/dL (ref 13.0–17.0)
Lymphocytes Relative: 15 % (ref 12.0–46.0)
Lymphs Abs: 1.3 10*3/uL (ref 0.7–4.0)
MCHC: 33.9 g/dL (ref 30.0–36.0)
MCV: 90 fl (ref 78.0–100.0)
Monocytes Absolute: 1 10*3/uL (ref 0.1–1.0)
Monocytes Relative: 11.6 % (ref 3.0–12.0)
Neutro Abs: 6.3 10*3/uL (ref 1.4–7.7)
Neutrophils Relative %: 72 % (ref 43.0–77.0)
Platelets: 130 10*3/uL — ABNORMAL LOW (ref 150.0–400.0)
RBC: 4.55 Mil/uL (ref 4.22–5.81)
RDW: 14.6 % (ref 11.5–15.5)
WBC: 8.8 10*3/uL (ref 4.0–10.5)

## 2021-01-11 LAB — LIPID PANEL
Cholesterol: 110 mg/dL (ref 0–200)
HDL: 42.7 mg/dL (ref >39.00)
LDL Cholesterol: 47 mg/dL (ref 0–99)
NonHDL: 67.25
Total CHOL/HDL Ratio: 3
Triglycerides: 99 mg/dL (ref 0.0–149.0)
VLDL: 19.8 mg/dL (ref 0.0–40.0)

## 2021-01-11 LAB — HEPATIC FUNCTION PANEL
ALT: 15 U/L (ref 0–53)
AST: 15 U/L (ref 0–37)
Albumin: 4.2 g/dL (ref 3.5–5.2)
Alkaline Phosphatase: 107 U/L (ref 39–117)
Bilirubin, Direct: 0.3 mg/dL (ref 0.0–0.3)
Total Bilirubin: 1.2 mg/dL (ref 0.2–1.2)
Total Protein: 7.1 g/dL (ref 6.0–8.3)

## 2021-01-11 LAB — BASIC METABOLIC PANEL
BUN: 24 mg/dL — ABNORMAL HIGH (ref 6–23)
CO2: 27 mEq/L (ref 19–32)
Calcium: 9.1 mg/dL (ref 8.4–10.5)
Chloride: 99 mEq/L (ref 96–112)
Creatinine, Ser: 1.47 mg/dL (ref 0.40–1.50)
GFR: 46.63 mL/min — ABNORMAL LOW (ref >60.00)
Glucose, Bld: 139 mg/dL — ABNORMAL HIGH (ref 70–99)
Potassium: 4.7 mEq/L (ref 3.5–5.1)
Sodium: 136 mEq/L (ref 135–145)

## 2021-01-11 LAB — TSH: TSH: 2.65 u[IU]/mL (ref 0.35–4.50)

## 2021-01-11 LAB — PSA: PSA: 3.44 ng/mL (ref 0.10–4.00)

## 2021-01-11 LAB — VITAMIN D 25 HYDROXY (VIT D DEFICIENCY, FRACTURES): VITD: 32.8 ng/mL (ref 30.00–100.00)

## 2021-01-11 LAB — VITAMIN B12: Vitamin B-12: 1194 pg/mL — ABNORMAL HIGH (ref 211–911)

## 2021-01-11 LAB — HEMOGLOBIN A1C: Hgb A1c MFr Bld: 7.4 % — ABNORMAL HIGH (ref 4.6–6.5)

## 2021-01-11 LAB — T4, FREE: Free T4: 1.45 ng/dL (ref 0.60–1.60)

## 2021-01-11 NOTE — Progress Notes (Signed)
Patient ID: Allen Myers, male   DOB: 1946/01/11, 75 y.o.   MRN: [id number]         Chief Complaint:: yearly exam       HPI:  Allen Myers is a 75 y.o. male here overall doing well,  Has had some increased low back pain in last several wks, found to be kidney stone felt to be symptomatic on top of flare of chronic low back pain with pain more on the right low back to the right hip area. S/p lithotripsy Monday; still some blood in urine, plans to /u urology soon.  Pt denies fever, wt loss, night sweats, loss of appetite, or other constitutional symptoms  Denies hyper or hypo thyroid symptoms such as voice, skin or hair change.  Taking B12 supplements.   Pt denies polydipsia, polyuria, Pt denies chest pain, increased sob or doe, wheezing, orthopnea, PND, increased LE swelling, palpitations, dizziness or syncope.  Denies new focal neuro s/s.  Wt Readings from Last 3 Encounters:  01/12/21 263 lb (119.3 kg)  01/11/21 260 lb (117.9 kg)  01/08/21 262 lb (118.8 kg)   BP Readings from Last 3 Encounters:  01/12/21 118/64  01/11/21 106/68  01/08/21 136/75   Immunization History  Administered Date(s) Administered  . Fluad Quad(high Dose 65+) 06/09/2019  . Influenza Whole 12/11/2009, 06/19/2011  . Influenza, High Dose Seasonal PF 08/07/2015, 07/24/2016, 07/10/2017, 06/16/2018  . Influenza,inj,Quad PF,6+ Mos 06/15/2013, 07/26/2014  . PFIZER(Purple Top)SARS-COV-2 Vaccination 12/30/2019, 01/24/2020, 07/29/2020  . Pneumococcal Conjugate-13 08/10/2014  . Pneumococcal Polysaccharide-23 05/16/2008, 08/23/2015  . Td 02/20/1996, 11/21/2008, 10/10/2010  . Zoster 05/16/2008   There are no preventive care reminders to display for this patient.    Past Medical History:  Diagnosis Date  . Allergy   . Arthritis   . CAD (coronary artery disease)   . Erectile dysfunction   . History of colon polyps   . History of diverticulitis   . HYPERTENSION   . Hypothyroidism   . Kidney stones    "passed it"  .  Melanoma ([place]) 12/10/2019  . Peripheral arterial disease ([place])   . SAH (subarachnoid hemorrhage) ([place])    Following syncopal episode  . SDH (subdural hematoma) ([facility])    Following syncopal episode  . Seizures ([place])    08/2014 - controlled  . Sleep apnea    "mild"  does not use CPAP  . Sleep apnea in adult   . Type II diabetes mellitus ([place])    type 2   Past Surgical History:  Procedure Laterality Date  . COLONOSCOPY  2014   polyp  . CORONARY ANGIOPLASTY WITH STENT PLACEMENT  05/11/2014   "1"  . CRANIOTOMY Left 09/12/2014   Procedure: CRANIOTOMY HEMATOMA EVACUATION SUBDURAL;  Surgeon: Erline Levine, MD;  Location: [facility];  Service: Neurosurgery;  Laterality: Left;  . EXTRACORPOREAL SHOCK WAVE LITHOTRIPSY Left 01/08/2021   Procedure: EXTRACORPOREAL SHOCK WAVE LITHOTRIPSY (ESWL);  Surgeon: Raynelle Bring, MD;  Location: [facility];  Service: Urology;  Laterality: Left;  . FRACTIONAL FLOW RESERVE WIRE  05/11/2014   Procedure: FRACTIONAL FLOW RESERVE WIRE;  Surgeon: [name], MD;  Location[facility] CATH LAB;  Service: Cardiovascular;;  . LEFT HEART CATHETERIZATION WITH CORONARY ANGIOGRAM N/A 05/11/2014   Procedure: LEFT HEART CATHETERIZATION WITH CORONARY ANGIOGRAM;  Surgeon: [name], MD;  Location: [facility] CATH LAB;  Service: Cardiovascular;  Laterality: N/A;  . POLYPECTOMY      reports that he quit smoking about 37  years ago. His smoking use included cigarettes. He has a 30.00 pack-year smoking history. He has never used smokeless tobacco. He reports that he does not drink alcohol and does not use drugs. family history includes AAA (abdominal aortic aneurysm) in his father; Aplastic anemia in his sister; Ataxia in his maternal grandmother; Dementia in his maternal grandmother; Heart disease in his father and mother; Lung cancer in his cousin and father; Prostate cancer in his brother. No Known Allergies Current Outpatient Medications on File Prior to Visit   Medication Sig Dispense Refill  . Ascorbic Acid (VITAMIN C PO) Take 1 tablet by mouth.    Marland Kitchen atorvastatin (LIPITOR) 80 MG tablet TAKE 1 TABLET DAILY 30 tablet 0  . carvedilol (COREG) 3.125 MG tablet TAKE 1 TABLET TWICE A DAY  WITH MEALS 180 tablet 1  . HYDROcodone-acetaminophen (NORCO/VICODIN) 5-325 MG tablet Take 1-2 tablets by mouth every 6 (six) hours as needed. 15 tablet 0  . levETIRAcetam (KEPPRA) 1000 MG tablet TAKE 1 TABLET TWICE A DAY 180 tablet 3  . levothyroxine (SYNTHROID) 137 MCG tablet TAKE 1 TABLET DAILY BEFORE BREAKFAST 90 tablet 3  . linagliptin (TRADJENTA) 5 MG TABS tablet Take 1 tablet (5 mg total) by mouth daily. 90 tablet 1  . lisinopril (ZESTRIL) 2.5 MG tablet TAKE 1 TABLET DAILY 90 tablet 1  . meloxicam (MOBIC) 15 MG tablet Take 15 mg by mouth daily.    . metFORMIN (GLUCOPHAGE) 1000 MG tablet TAKE 1 TABLET TWICE DAILY  WITH MEALS 180 tablet 1  . methocarbamol (ROBAXIN) 500 MG tablet Take 1 tablet (500 mg total) by mouth every 8 (eight) hours as needed. 30 tablet 0  . nitroGLYCERIN (NITROSTAT) 0.4 MG SL tablet PLACE 1 TABLET UNDER THE   TONGUE AND ALLOW TO        DISSOLVE EVERY 5 MINUTES ASNEEDED FOR CHEST PAIN 25 tablet 12  . omeprazole (PRILOSEC) 40 MG capsule TAKE 1 CAPSULE DAILY 90 capsule 2  . ONE TOUCH ULTRA TEST test strip USE TO CHECK BLOOD SUGAR TWICE A DAY AS DIRECTED 100 each 0  . pioglitazone (ACTOS) 45 MG tablet TAKE 1 TABLET DAILY 90 tablet 3  . tamsulosin (FLOMAX) 0.4 MG CAPS capsule Take 1 capsule (0.4 mg total) by mouth at bedtime. 14 capsule 0  . valACYclovir (VALTREX) 1000 MG tablet Take 1 tablet (1,000 mg total) by mouth 2 (two) times daily as needed. 20 tablet 5  . vitamin B-12 (CYANOCOBALAMIN) 1000 MCG tablet Take 1 tablet (1,000 mcg total) by mouth daily. 90 tablet 1   No current facility-administered medications on file prior to visit.        ROS:  All others reviewed and negative.  Objective        PE:  BP 106/68 (BP Location: Left Arm, Patient  Position: Sitting, Cuff Size: Large)   Pulse 73   Temp 98.3 F (36.8 C) (Oral)   Ht 6\' 1"  (1.854 m)   Wt 260 lb (117.9 kg)   SpO2 96%   BMI 34.30 kg/m                 Constitutional: Pt appears in NAD               HENT: Head: NCAT.                Right Ear: External ear normal.                 Left Ear: External ear normal.  Eyes: . Pupils are equal, round, and reactive to light. Conjunctivae and EOM are normal               Nose: without d/c or deformity               Neck: Neck supple. Gross normal ROM               Cardiovascular: Normal rate and regular rhythm.                 Pulmonary/Chest: Effort normal and breath sounds without rales or wheezing.                Abd:  Soft, NT, ND, + BS, no organomegaly               Neurological: Pt is alert. At baseline orientation, motor grossly intact               Skin: Skin is warm. No rashes, no other new lesions, LE edema - none               Psychiatric: Pt behavior is normal without agitation   Micro: none  Cardiac tracings I have personally interpreted today:  none  Pertinent Radiological findings (summarize): none   Lab Results  Component Value Date   WBC 8.8 01/11/2021   HGB 13.9 01/11/2021   HCT 40.9 01/11/2021   PLT 130.0 (L) 01/11/2021   GLUCOSE 139 (H) 01/11/2021   CHOL 110 01/11/2021   TRIG 99.0 01/11/2021   HDL 42.70 01/11/2021   LDLDIRECT 85.7 06/11/2013   LDLCALC 47 01/11/2021   ALT 15 01/11/2021   AST 15 01/11/2021   NA 136 01/11/2021   K 4.7 01/11/2021   CL 99 01/11/2021   CREATININE 1.47 01/11/2021   BUN 24 (H) 01/11/2021   CO2 27 01/11/2021   TSH 2.65 01/11/2021   PSA 3.44 01/11/2021   INR 1.07 09/06/2014   HGBA1C 7.4 (H) 01/11/2021   MICROALBUR 24.2 (H) 01/11/2021   Assessment/Plan:  Allen Myers is a 75 y.o. White or Caucasian [1] male with  has a past medical history of Allergy, Arthritis, CAD (coronary artery disease), Erectile dysfunction, History of colon polyps, History  of diverticulitis, HYPERTENSION, Hypothyroidism, Kidney stones, Melanoma ([place]) (12/10/2019), Peripheral arterial disease ([place]), SAH (subarachnoid hemorrhage) ([place]), SDH (subdural hematoma) ([place]), Seizures ([place]), Sleep apnea, Sleep apnea in adult, and Type II diabetes mellitus ([place]).  Hypothyroidism Lab Results  Component Value Date   TSH 2.65 01/11/2021   Stable, pt to continue levothyroxine 137 mcg   HLD (hyperlipidemia) Lab Results  Component Value Date   LDLCALC 47 01/11/2021   Stable, pt to continue current statin lipitor 80   Essential hypertension BP Readings from Last 3 Encounters:  01/12/21 118/64  01/11/21 106/68  01/08/21 136/75   Stable, pt to continue medical treatment - coreg, zestril   Diabetes mellitus type 2, noninsulin dependent ([place]) Lab Results  Component Value Date   HGBA1C 7.4 (H) 01/11/2021   Stable, pt to continue current medical treatment tradjenta, metformin, actos   B12 deficiency Lab Results  Component Value Date   VITAMINB12 1,194 (H) 01/11/2021   Stable, cont oral replacement - b12 1000 mcg qd   Followup: Return in about 6 months (around 07/13/2021).  Cathlean Cower, MD 01/14/2021 12:57 PM [facility]

## 2021-01-11 NOTE — Progress Notes (Signed)
[place] Phone: [phone] Subjective:   [name], am serving as a scribe for Dr. Hulan Saas. This visit occurred during the SARS-CoV-2 public health emergency.  Safety protocols were in place, including screening questions prior to the visit, additional usage of staff PPE, and extensive cleaning of exam room while observing appropriate contact time as indicated for disinfecting solutions.   I'm seeing this patient by the request  of:  Biagio Borg, MD  CC: Low back pain follow-up  EYC:[id number]   12/21/2020 Significant change in the last 10 months from 3.5 cm to 4.4 cm but this is comparing an ultrasound to the CT scan.  Patient is scheduled for another ultrasound in 2 weeks.  No abdominal pain at this time.  Discussed though that if any abdominal pain, severe back pain to seek medical attention immediately.  Patient initially did have acute low back pain with radicular symptoms.  Had difficulty with walking and severe pain.  Patient does have a history of the abdominal aortic aneurysm that there was was not associated with any type of abdominal pain or anything midline.  Patient's vitals are stable.  Patient is already scheduled for the ultrasound and 2 weeks.  Discussed with patient in great length about this likely being harmed exacerbation of the underlying arthritis that was noted on CT scan at L4-L5 with potential some mild radicular symptoms initially.  Now seems to be more muscular in nature.  Given home exercises.  Patient would like to try to do them on his own first.  Patient is neurovascularly intact distally.  Warned patient know that if any association with any abdominal pain, worsening back pain to seek medical attention immediately.  Patient does have meloxicam that has been given to him by his primary care provider.  We discussed using this only sparingly if possible discussed more topical  anti-inflammatories or over-the-counter topical may be more beneficial as well as icing regimen.  Follow-up with me again in 3 weeks or can call if the pain seems to be worsening and we can consider MRI if necessary  Update 01/12/2021 UZOMA VIVONA is a 75 y.o. male coming in with complaint of low back pain. Pain has subsided in his lower back. Continues to have chronic pain in coccyx but overall is doing much better.  Patient states that he has been able to do more activities.  Not having any side effects to any of the medications.  Patient feels like he is not nearly back to his baseline.  Had lithotripsy on Monday for one of his kidney stones.     Past Medical History:  Diagnosis Date  . Allergy   . Arthritis   . CAD (coronary artery disease)   . Erectile dysfunction   . History of colon polyps   . History of diverticulitis   . HYPERTENSION   . Hypothyroidism   . Kidney stones    "passed it"  . Melanoma ([place]) 12/10/2019  . Peripheral arterial disease ([place])   . SAH (subarachnoid hemorrhage) ([place])    Following syncopal episode  . SDH (subdural hematoma) ([facility])    Following syncopal episode  . Seizures ([place])    08/2014 - controlled  . Sleep apnea    "mild"  does not use CPAP  . Sleep apnea in adult   . Type II diabetes mellitus ([place])    type 2   Past Surgical History:  Procedure Laterality  Date  . COLONOSCOPY  2014   polyp  . CORONARY ANGIOPLASTY WITH STENT PLACEMENT  05/11/2014   "1"  . CRANIOTOMY Left 09/12/2014   Procedure: CRANIOTOMY HEMATOMA EVACUATION SUBDURAL;  Surgeon: Erline Levine, MD;  Location: [facility];  Service: Neurosurgery;  Laterality: Left;  . EXTRACORPOREAL SHOCK WAVE LITHOTRIPSY Left 01/08/2021   Procedure: EXTRACORPOREAL SHOCK WAVE LITHOTRIPSY (ESWL);  Surgeon: Raynelle Bring, MD;  Location[facility];  Service: Urology;  Laterality: Left;  . FRACTIONAL FLOW RESERVE WIRE  05/11/2014   Procedure: FRACTIONAL FLOW RESERVE WIRE;  Surgeon:  [name], MD;  Location: [facility] CATH LAB;  Service: Cardiovascular;;  . LEFT HEART CATHETERIZATION WITH CORONARY ANGIOGRAM N/A 05/11/2014   Procedure: LEFT HEART CATHETERIZATION WITH CORONARY ANGIOGRAM;  Surgeon: [name], MD;  Location: [facility] CATH LAB;  Service: Cardiovascular;  Laterality: N/A;  . POLYPECTOMY     Social History   Socioeconomic History  . Marital status: Married    Spouse name: Beverlee Nims  . Number of children: 3  . Years of education: 67  . Highest education level: Not on file  Occupational History  . 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 Occupation: [phone] (CELL)    Employer: [employer]  Tobacco Use  . Smoking status: Former Smoker    Packs/day: 1.00    Years: 30.00    Pack years: 30.00    Types: Cigarettes    Quit date: 10/15/1983    Years since quitting: 37.2  . Smokeless tobacco: Never Used  Vaping Use  . Vaping Use: Never used  Substance and Sexual Activity  . Alcohol use: No    Alcohol/week: 0.0 standard drinks  . Drug use: No  . Sexual activity: Yes  Other Topics Concern  . Not on file  Social History Narrative   Patient is married with 3 daughters 38, 8, 42 and 2 grandchildren.   Patient is right handed.   Patient has hs education.   Patient drinks decaf, and soda occasionally.   Social Determinants of Health   Financial Resource Strain: Not on file  Food Insecurity: Not on file  Transportation Needs: Not on file  Physical Activity: Not on file  Stress: Not on file  Social Connections: Not on file   No Known Allergies Family History  Problem Relation Age of Onset  . Lung cancer Father        dad was a smoker  . Heart disease Father   . AAA (abdominal aortic aneurysm) Father   . Heart disease Mother   . Ataxia Maternal Grandmother   . Dementia Maternal Grandmother   . Aplastic anemia Sister   . Prostate cancer Brother   . Lung  cancer Cousin   . Colon cancer Neg Hx   . Rectal cancer Neg Hx   . Stomach cancer Neg Hx   . Colon polyps Neg Hx   . Esophageal cancer Neg Hx     Current Outpatient Medications (Endocrine & Metabolic):  .  levothyroxine (SYNTHROID) 137 MCG tablet, TAKE 1 TABLET DAILY BEFORE BREAKFAST .  linagliptin (TRADJENTA) 5 MG TABS tablet, Take 1 tablet (5 mg total) by mouth daily. .  metFORMIN (GLUCOPHAGE) 1000 MG tablet, TAKE 1 TABLET TWICE DAILY  WITH MEALS .  pioglitazone (ACTOS) 45 MG tablet, TAKE 1 TABLET DAILY  Current Outpatient Medications (Cardiovascular):  .  atorvastatin (LIPITOR) 80 MG tablet, TAKE 1 TABLET  DAILY .  carvedilol (COREG) 3.125 MG tablet, TAKE 1 TABLET TWICE A DAY  WITH MEALS .  lisinopril (ZESTRIL) 2.5 MG tablet, TAKE 1 TABLET DAILY .  nitroGLYCERIN (NITROSTAT) 0.4 MG SL tablet, PLACE 1 TABLET UNDER THE   TONGUE AND ALLOW TO        DISSOLVE EVERY 5 MINUTES ASNEEDED FOR CHEST PAIN   Current Outpatient Medications (Analgesics):  .  HYDROcodone-acetaminophen (NORCO/VICODIN) 5-325 MG tablet, Take 1-2 tablets by mouth every 6 (six) hours as needed. .  meloxicam (MOBIC) 15 MG tablet, Take 15 mg by mouth daily.  Current Outpatient Medications (Hematological):  .  vitamin B-12 (CYANOCOBALAMIN) 1000 MCG tablet, Take 1 tablet (1,000 mcg total) by mouth daily.  Current Outpatient Medications (Other):  Marland Kitchen  Ascorbic Acid (VITAMIN C PO), Take 1 tablet by mouth. .  levETIRAcetam (KEPPRA) 1000 MG tablet, TAKE 1 TABLET TWICE A DAY .  methocarbamol (ROBAXIN) 500 MG tablet, Take 1 tablet (500 mg total) by mouth every 8 (eight) hours as needed. Marland Kitchen  omeprazole (PRILOSEC) 40 MG capsule, TAKE 1 CAPSULE DAILY .  ONE TOUCH ULTRA TEST test strip, USE TO CHECK BLOOD SUGAR TWICE A DAY AS DIRECTED .  tamsulosin (FLOMAX) 0.4 MG CAPS capsule, Take 1 capsule (0.4 mg total) by mouth at bedtime. .  valACYclovir (VALTREX) 1000 MG tablet, Take 1 tablet (1,000 mg total) by mouth 2 (two) times daily as  needed.   Reviewed prior external information including notes and imaging from  primary care provider As well as notes that were available from care everywhere and other healthcare systems.  Past medical history, social, surgical and family history all reviewed in electronic medical record.  No pertanent information unless stated regarding to the chief complaint.   Review of Systems:  No headache, visual changes, nausea, vomiting, diarrhea, constipation, dizziness, abdominal pain, skin rash, fevers, chills, night sweats, weight loss, swollen lymph nodes, body aches, joint swelling, chest pain, shortness of breath, mood changes. POSITIVE muscle aches  Objective  Blood pressure 118/64, pulse (!) 101, height 6\' 1"  (1.854 m), weight 263 lb (119.3 kg), SpO2 96 %.   General: No apparent distress alert and oriented x3 mood and affect normal, dressed appropriately.  HEENT: Pupils equal, extraocular movements intact  Respiratory: Patient's speak in full sentences and does not appear short of breath  Cardiovascular: No lower extremity edema, non tender, no erythema  Gait normal with good balance and coordination.  MSK:  N arthritic changes of multiple joints Patient back is still has some mild loss of lordosis.  Significant tightness with straight leg as well as FABER test on the right side only.  Patient has worsening pain in the back with extension greater than 10 degrees.  5 out of 5 strength of the lower extremities are noted.  Neurovascularly intact.  Patient is able to get out of the chair without any significant difficulty.    Impression and Recommendations:    The above documentation has been reviewed and is accurate and complete Lyndal Pulley, DO

## 2021-01-11 NOTE — Patient Instructions (Signed)

## 2021-01-12 ENCOUNTER — Encounter: Payer: Self-pay | Admitting: Family Medicine

## 2021-01-12 ENCOUNTER — Ambulatory Visit (INDEPENDENT_AMBULATORY_CARE_PROVIDER_SITE_OTHER): Payer: Medicare Other | Admitting: Family Medicine

## 2021-01-12 DIAGNOSIS — M5440 Lumbago with sciatica, unspecified side: Secondary | ICD-10-CM

## 2021-01-12 NOTE — Assessment & Plan Note (Signed)
Patient is significantly better at this time.  Doing very well with this as well as massage therapy.  Discussed icing regimen and home exercises.  Discussed which activities to do which wants to avoid.  Discussed using anti-inflammatories only in 3 to 5-day burst.  Follow-up with me again in 3 months if having any difficulty.  Worsening pain possible advanced imaging would be necessary

## 2021-01-12 NOTE — Patient Instructions (Signed)
3 IBU 3x a day for 3 days See me again in 3 months

## 2021-01-14 ENCOUNTER — Encounter: Payer: Self-pay | Admitting: Internal Medicine

## 2021-01-14 DIAGNOSIS — E538 Deficiency of other specified B group vitamins: Secondary | ICD-10-CM | POA: Insufficient documentation

## 2021-01-14 NOTE — Assessment & Plan Note (Signed)
Lab Results  Component Value Date   TSH 2.65 01/11/2021   Stable, pt to continue levothyroxine 137 mcg

## 2021-01-14 NOTE — Assessment & Plan Note (Signed)
Lab Results  Component Value Date   LDLCALC 47 01/11/2021   Stable, pt to continue current statin lipitor 80

## 2021-01-14 NOTE — Assessment & Plan Note (Signed)
Lab Results  Component Value Date   HGBA1C 7.4 (H) 01/11/2021   Stable, pt to continue current medical treatment tradjenta, metformin, actos

## 2021-01-14 NOTE — Assessment & Plan Note (Signed)
BP Readings from Last 3 Encounters:  01/12/21 118/64  01/11/21 106/68  01/08/21 136/75   Stable, pt to continue medical treatment - coreg, zestril

## 2021-01-14 NOTE — Assessment & Plan Note (Signed)
Lab Results  Component Value Date   VITAMINB12 1,194 (H) 01/11/2021   Stable, cont oral replacement - b12 1000 mcg qd

## 2021-02-01 DIAGNOSIS — N210 Calculus in bladder: Secondary | ICD-10-CM | POA: Diagnosis not present

## 2021-02-01 DIAGNOSIS — R319 Hematuria, unspecified: Secondary | ICD-10-CM | POA: Diagnosis not present

## 2021-02-01 DIAGNOSIS — N200 Calculus of kidney: Secondary | ICD-10-CM | POA: Insufficient documentation

## 2021-02-01 DIAGNOSIS — R3915 Urgency of urination: Secondary | ICD-10-CM | POA: Diagnosis present

## 2021-02-01 DIAGNOSIS — N401 Enlarged prostate with lower urinary tract symptoms: Secondary | ICD-10-CM | POA: Diagnosis not present

## 2021-02-01 DIAGNOSIS — E039 Hypothyroidism, unspecified: Secondary | ICD-10-CM | POA: Diagnosis present

## 2021-02-01 DIAGNOSIS — N138 Other obstructive and reflux uropathy: Secondary | ICD-10-CM | POA: Diagnosis not present

## 2021-02-01 DIAGNOSIS — S37022A Major contusion of left kidney, initial encounter: Secondary | ICD-10-CM | POA: Diagnosis present

## 2021-02-01 DIAGNOSIS — I10 Essential (primary) hypertension: Secondary | ICD-10-CM | POA: Diagnosis not present

## 2021-02-01 DIAGNOSIS — Z9889 Other specified postprocedural states: Secondary | ICD-10-CM | POA: Diagnosis not present

## 2021-02-01 DIAGNOSIS — Z7984 Long term (current) use of oral hypoglycemic drugs: Secondary | ICD-10-CM | POA: Diagnosis not present

## 2021-02-01 DIAGNOSIS — R1032 Left lower quadrant pain: Secondary | ICD-10-CM | POA: Diagnosis not present

## 2021-02-01 DIAGNOSIS — Z79890 Hormone replacement therapy (postmenopausal): Secondary | ICD-10-CM | POA: Diagnosis not present

## 2021-02-01 DIAGNOSIS — R310 Gross hematuria: Secondary | ICD-10-CM | POA: Diagnosis not present

## 2021-02-01 DIAGNOSIS — N400 Enlarged prostate without lower urinary tract symptoms: Secondary | ICD-10-CM | POA: Diagnosis not present

## 2021-02-01 DIAGNOSIS — Z87442 Personal history of urinary calculi: Secondary | ICD-10-CM | POA: Diagnosis not present

## 2021-02-01 DIAGNOSIS — E785 Hyperlipidemia, unspecified: Secondary | ICD-10-CM | POA: Diagnosis not present

## 2021-02-01 DIAGNOSIS — S37012A Minor contusion of left kidney, initial encounter: Secondary | ICD-10-CM | POA: Diagnosis not present

## 2021-02-01 DIAGNOSIS — Z7982 Long term (current) use of aspirin: Secondary | ICD-10-CM | POA: Diagnosis not present

## 2021-02-01 DIAGNOSIS — Z20822 Contact with and (suspected) exposure to covid-19: Secondary | ICD-10-CM | POA: Diagnosis not present

## 2021-02-01 DIAGNOSIS — I2510 Atherosclerotic heart disease of native coronary artery without angina pectoris: Secondary | ICD-10-CM | POA: Diagnosis not present

## 2021-02-01 DIAGNOSIS — R339 Retention of urine, unspecified: Secondary | ICD-10-CM | POA: Diagnosis present

## 2021-02-01 DIAGNOSIS — E119 Type 2 diabetes mellitus without complications: Secondary | ICD-10-CM | POA: Diagnosis not present

## 2021-02-01 DIAGNOSIS — Z79899 Other long term (current) drug therapy: Secondary | ICD-10-CM | POA: Diagnosis not present

## 2021-02-01 DIAGNOSIS — Z955 Presence of coronary angioplasty implant and graft: Secondary | ICD-10-CM | POA: Diagnosis not present

## 2021-02-01 DIAGNOSIS — K5730 Diverticulosis of large intestine without perforation or abscess without bleeding: Secondary | ICD-10-CM | POA: Diagnosis not present

## 2021-02-01 DIAGNOSIS — R569 Unspecified convulsions: Secondary | ICD-10-CM | POA: Diagnosis present

## 2021-02-03 ENCOUNTER — Other Ambulatory Visit: Payer: Self-pay | Admitting: Internal Medicine

## 2021-02-03 NOTE — Telephone Encounter (Signed)
Please refill as per office routine med refill policy (all routine meds refilled for 3 mo or monthly per pt preference up to one year from last visit, then month to month grace period for 3 mo, then further med refills will have to be denied)  

## 2021-02-08 DIAGNOSIS — N200 Calculus of kidney: Secondary | ICD-10-CM | POA: Diagnosis not present

## 2021-02-15 DIAGNOSIS — N200 Calculus of kidney: Secondary | ICD-10-CM | POA: Diagnosis not present

## 2021-02-20 ENCOUNTER — Ambulatory Visit (INDEPENDENT_AMBULATORY_CARE_PROVIDER_SITE_OTHER): Payer: Medicare Other | Admitting: Adult Health

## 2021-02-20 ENCOUNTER — Encounter: Payer: Self-pay | Admitting: Adult Health

## 2021-02-20 VITALS — BP 114/66 | HR 77 | Ht 73.0 in | Wt 257.0 lb

## 2021-02-20 DIAGNOSIS — R569 Unspecified convulsions: Secondary | ICD-10-CM | POA: Diagnosis not present

## 2021-02-20 DIAGNOSIS — Z8679 Personal history of other diseases of the circulatory system: Secondary | ICD-10-CM | POA: Diagnosis not present

## 2021-02-20 MED ORDER — LEVETIRACETAM 1000 MG PO TABS: 1000.0000 mg | ORAL_TABLET | Freq: Two times a day (BID) | ORAL | 3 refills | Status: DC

## 2021-02-20 NOTE — Patient Instructions (Signed)
Your Plan:  Continue Keppra 1000 mg twice daily for seizure prevention    Follow-up in 1 year or call earlier if needed    Thank you for coming to see [place] at [facility]. I hope we have been able to provide you high quality care today.  You may receive a patient satisfaction survey over the next few weeks. We would appreciate your feedback and comments so that we may continue to improve ourselves and the health of our patients.

## 2021-02-20 NOTE — Progress Notes (Signed)
[facility]  PATIENT: Allen Myers DOB: Mar 31, 1946   REASON FOR VISIT: SDH 03/2014 and seizure disorder HISTORY FROM: patient   Chief complaint: Chief Complaint  Patient presents with  . Follow-up    RM 14 alone Pt is well and stable, no seizures       HPI  Allen Myers is a 75 y.o. male who continues to be followed in this office for history of traumatic subdural hematoma 03/2014 with resultant seizure disorder.    Today, 02/20/2021, [name] returns for 1 year seizure follow-up unaccompanied  Doing well since prior visit without any reoccurring seizure activity.  No seizures since 2015 Reports compliance on Keppra 1000 mg twice daily tolerating without side effects  He has been having issues with kidney stones s/p lithotripsy 01/08/2021 -reports recent follow-up with urology with repeat ultrasound which showed possibly 1 remaining kidney stone in his bladder (per pt report) but otherwise doing well  No further concerns at this time    REVIEW OF SYSTEMS: Full 14 system review of systems performed and notable only for those  listed, all others are neg:  No complaints   ALLERGIES: No Known Allergies  HOME MEDICATIONS: Outpatient Medications Prior to Visit  Medication Sig Dispense Refill  . Ascorbic Acid (VITAMIN C PO) Take 1 tablet by mouth.    Marland Kitchen atorvastatin (LIPITOR) 80 MG tablet TAKE 1 TABLET DAILY 90 tablet 2  . carvedilol (COREG) 3.125 MG tablet TAKE 1 TABLET TWICE A DAY  WITH MEALS 180 tablet 1  . levETIRAcetam (KEPPRA) 1000 MG tablet TAKE 1 TABLET TWICE A DAY 180 tablet 3  . levothyroxine (SYNTHROID) 137 MCG tablet TAKE 1 TABLET DAILY BEFORE BREAKFAST 90 tablet 3  . linagliptin (TRADJENTA) 5 MG TABS tablet Take 1 tablet (5 mg total) by mouth daily. 90 tablet 1  . lisinopril (ZESTRIL) 2.5 MG tablet TAKE 1 TABLET DAILY 90 tablet 1  . metFORMIN (GLUCOPHAGE) 1000 MG tablet TAKE 1 TABLET TWICE DAILY  WITH MEALS 180 tablet 1  . omeprazole  (PRILOSEC) 40 MG capsule TAKE 1 CAPSULE DAILY 90 capsule 2  . ONE TOUCH ULTRA TEST test strip USE TO CHECK BLOOD SUGAR TWICE A DAY AS DIRECTED 100 each 0  . pioglitazone (ACTOS) 45 MG tablet TAKE 1 TABLET DAILY 90 tablet 3  . tamsulosin (FLOMAX) 0.4 MG CAPS capsule Take 1 capsule (0.4 mg total) by mouth at bedtime. 14 capsule 0  . valACYclovir (VALTREX) 1000 MG tablet Take 1 tablet (1,000 mg total) by mouth 2 (two) times daily as needed. 20 tablet 5  . vitamin B-12 (CYANOCOBALAMIN) 1000 MCG tablet Take 1 tablet (1,000 mcg total) by mouth daily. 90 tablet 1  . HYDROcodone-acetaminophen (NORCO/VICODIN) 5-325 MG tablet Take 1-2 tablets by mouth every 6 (six) hours as needed. 15 tablet 0  . meloxicam (MOBIC) 15 MG tablet Take 15 mg by mouth daily.    . methocarbamol (ROBAXIN) 500 MG tablet Take 1 tablet (500 mg total) by mouth every 8 (eight) hours as needed. 30 tablet 0  . nitroGLYCERIN (NITROSTAT) 0.4 MG SL tablet PLACE 1 TABLET UNDER THE   TONGUE AND ALLOW TO        DISSOLVE EVERY 5 MINUTES ASNEEDED FOR CHEST PAIN 25 tablet 12   No facility-administered medications prior to visit.    PAST MEDICAL HISTORY: Past Medical History:  Diagnosis Date  . Allergy   . Arthritis   . CAD (coronary artery disease)   . Erectile dysfunction   .  History of colon polyps   . History of diverticulitis   . HYPERTENSION   . Hypothyroidism   . Kidney stones    "passed it"  . Melanoma ([place]) 12/10/2019  . Peripheral arterial disease ([place])   . SAH (subarachnoid hemorrhage) ([place])    Following syncopal episode  . SDH (subdural hematoma) ([facility])    Following syncopal episode  . Seizures ([place])    08/2014 - controlled  . Sleep apnea    "mild"  does not use CPAP  . Sleep apnea in adult   . Type II diabetes mellitus ([place])    type 2    PAST SURGICAL HISTORY: Past Surgical History:  Procedure Laterality Date  . COLONOSCOPY  2014   polyp  . CORONARY ANGIOPLASTY WITH STENT PLACEMENT  05/11/2014   "1"  .  CRANIOTOMY Left 09/12/2014   Procedure: CRANIOTOMY HEMATOMA EVACUATION SUBDURAL;  Surgeon: Erline Levine, MD;  Location: [facility];  Service: Neurosurgery;  Laterality: Left;  . EXTRACORPOREAL SHOCK WAVE LITHOTRIPSY Left 01/08/2021   Procedure: EXTRACORPOREAL SHOCK WAVE LITHOTRIPSY (ESWL);  Surgeon: Raynelle Bring, MD;  Location[facility];  Service: Urology;  Laterality: Left;  . FRACTIONAL FLOW RESERVE WIRE  05/11/2014   Procedure: FRACTIONAL FLOW RESERVE WIRE;  Surgeon: [name], MD;  [facility] CATH LAB;  Service: Cardiovascular;;  . LEFT HEART CATHETERIZATION WITH CORONARY ANGIOGRAM N/A 05/11/2014   Procedure: LEFT HEART CATHETERIZATION WITH CORONARY ANGIOGRAM;  Surgeon: [name], MD;  Location: [facility] CATH LAB;  Service: Cardiovascular;  Laterality: N/A;  . POLYPECTOMY      FAMILY HISTORY: Family History  Problem Relation Age of Onset  . Lung cancer Father        dad was a smoker  . Heart disease Father   . AAA (abdominal aortic aneurysm) Father   . Heart disease Mother   . Ataxia Maternal Grandmother   . Dementia Maternal Grandmother   . Aplastic anemia Sister   . Prostate cancer Brother   . Lung cancer Cousin   . Colon cancer Neg Hx   . Rectal cancer Neg Hx   . Stomach cancer Neg Hx   . Colon polyps Neg Hx   . Esophageal cancer Neg Hx     SOCIAL HISTORY: Social History   Socioeconomic History  . Marital status: Married    Spouse name: Beverlee Nims  . Number of children: 3  . Years of education: 53  . Highest education level: Not on file  Occupational History  . 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 Occupation: [phone] (CELL)    Employer: [employer]  Tobacco Use  . Smoking status: Former Smoker    Packs/day: 1.00    Years: 30.00    Pack years: 30.00    Types: Cigarettes    Quit date: 10/15/1983    Years since quitting: 37.3  . Smokeless  tobacco: Never Used  Vaping Use  . Vaping Use: Never used  Substance and Sexual Activity  . Alcohol use: No    Alcohol/week: 0.0 standard drinks  . Drug use: No  . Sexual activity: Yes  Other Topics Concern  . Not on file  Social History Narrative   Patient is married with 3 daughters 31, 39, 86 and 2 grandchildren.   Patient is right handed.   Patient has hs education.   Patient drinks decaf, and soda occasionally.   Social Determinants  of Health   Financial Resource Strain: Not on file  Food Insecurity: Not on file  Transportation Needs: Not on file  Physical Activity: Not on file  Stress: Not on file  Social Connections: Not on file  Intimate Partner Violence: Not on file       Vital signs Today's Vitals   02/20/21 1130  Weight: 257 lb (116.6 kg)  Height: 6\' 1"  (1.854 m)   Body mass index is 33.91 kg/m.  Physical exam General: well developed, well nourished,  pleasant elderly Caucasian male, seated, in no evident distress Head: head normocephalic and atraumatic.   Neck: supple with no carotid or supraclavicular bruits Cardiovascular: regular rate and rhythm, no murmurs Musculoskeletal: no deformity Skin:  no rash/petichiae Vascular:  Normal pulses all extremities   Neurologic Exam Mental Status: Awake and fully alert.   Fluent speech and language.  Oriented to place and time. Recent and remote memory intact. Attention span, concentration and fund of knowledge appropriate. Mood and affect appropriate.  Cranial Nerves: Pupils equal, briskly reactive to light. Extraocular movements full without nystagmus. Visual fields full to confrontation. Hearing intact. Facial sensation intact. Face, tongue, palate moves normally and symmetrically.  Motor: Normal bulk and tone. Normal strength in all tested extremity muscles. Sensory.: intact to touch , pinprick , position and vibratory sensation.  Coordination: Rapid alternating movements normal in all extremities.  Finger-to-nose and heel-to-shin performed accurately bilaterally. Gait and Station: Arises from chair without difficulty. Stance is normal. Gait demonstrates normal stride length and balance Reflexes: 1+ and symmetric. Toes downgoing.         ASSESSMENT AND PLAN 75 y.o. Caucasian male with PMH of CAD s/p stent in 04/2014 on ASA , right SDH s/p fall with seizure in 03/2014 put on keppra was admitted again in 08/2014 for left SDH s/p fall. Had slight enlargement of left SDH over days and frequent focal seizure with aphasia and right cheek numbness. Increased keppra dose to 1000mg  bid. Had SDH evacuation on 09/12/14. Discharged with ASA 81mg . 09/2014, repeat CT showed evolving left SDH.     Plan:   1.  Seizures post traumatic R SDH -Doing well without any seizure activity -last reported seizure 2015 -continue keppra 1000mg  bid for seizure prophylaxis -refill provided -Discussed importance of avoiding seizure provoking triggers and activities   He will return in 1 year or call earlier if needed   CC:  [facility] provider: Dr. Janene Harvey, Hunt Oris, MD    Frann Rider, AGNP-BC  [facility] [address] [place], [place] [address]  Phone [phone] Fax [phone] Note: This document was prepared with digital dictation and possible smart phrase technology. Any transcriptional errors that result from this process are unintentional.

## 2021-02-22 NOTE — Progress Notes (Signed)
I agree with the above plan 

## 2021-03-03 ENCOUNTER — Telehealth: Payer: Self-pay | Admitting: Internal Medicine

## 2021-03-19 NOTE — Telephone Encounter (Signed)
It appears this was stopped at your last hospital d/c

## 2021-03-19 NOTE — Telephone Encounter (Signed)
Patient has been notified

## 2021-03-19 NOTE — Telephone Encounter (Signed)
    Patient states he should still be taking cilostazol (PLETAL) 100 MG tablet  Please advise

## 2021-04-03 ENCOUNTER — Other Ambulatory Visit: Payer: Self-pay | Admitting: Internal Medicine

## 2021-04-03 NOTE — Telephone Encounter (Signed)
Please refill as per office routine med refill policy (all routine meds refilled for 3 mo or monthly per pt preference up to one year from last visit, then month to month grace period for 3 mo, then further med refills will have to be denied)  

## 2021-04-24 ENCOUNTER — Telehealth: Payer: Self-pay | Admitting: Internal Medicine

## 2021-04-24 NOTE — Telephone Encounter (Signed)
LVM for pt to rtn my call to schedule AWV with [facility]. Please schedule AWV with [facility] if pt calls the office.

## 2021-04-26 ENCOUNTER — Other Ambulatory Visit: Payer: Self-pay | Admitting: Internal Medicine

## 2021-04-26 DIAGNOSIS — L570 Actinic keratosis: Secondary | ICD-10-CM | POA: Diagnosis not present

## 2021-04-26 DIAGNOSIS — L821 Other seborrheic keratosis: Secondary | ICD-10-CM | POA: Diagnosis not present

## 2021-04-26 DIAGNOSIS — Z85828 Personal history of other malignant neoplasm of skin: Secondary | ICD-10-CM | POA: Diagnosis not present

## 2021-04-26 DIAGNOSIS — D692 Other nonthrombocytopenic purpura: Secondary | ICD-10-CM | POA: Diagnosis not present

## 2021-04-26 DIAGNOSIS — D225 Melanocytic nevi of trunk: Secondary | ICD-10-CM | POA: Diagnosis not present

## 2021-04-26 DIAGNOSIS — L814 Other melanin hyperpigmentation: Secondary | ICD-10-CM | POA: Diagnosis not present

## 2021-04-26 DIAGNOSIS — Z85820 Personal history of malignant melanoma of skin: Secondary | ICD-10-CM | POA: Diagnosis not present

## 2021-04-26 NOTE — Telephone Encounter (Signed)
Please refill as per office routine med refill policy (all routine meds refilled for 3 mo or monthly per pt preference up to one year from last visit, then month to month grace period for 3 mo, then further med refills will have to be denied)  
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
## 2021-05-24 ENCOUNTER — Encounter: Payer: Self-pay | Admitting: Cardiology

## 2021-05-24 ENCOUNTER — Other Ambulatory Visit: Payer: Self-pay

## 2021-05-24 ENCOUNTER — Ambulatory Visit (INDEPENDENT_AMBULATORY_CARE_PROVIDER_SITE_OTHER): Payer: Medicare Other | Admitting: Cardiology

## 2021-05-24 VITALS — BP 112/62 | HR 68 | Ht 73.0 in | Wt 249.0 lb

## 2021-05-24 DIAGNOSIS — I714 Abdominal aortic aneurysm, without rupture, unspecified: Secondary | ICD-10-CM

## 2021-05-24 DIAGNOSIS — E7800 Pure hypercholesterolemia, unspecified: Secondary | ICD-10-CM

## 2021-05-24 DIAGNOSIS — I2510 Atherosclerotic heart disease of native coronary artery without angina pectoris: Secondary | ICD-10-CM

## 2021-05-24 DIAGNOSIS — I10 Essential (primary) hypertension: Secondary | ICD-10-CM

## 2021-05-24 NOTE — Patient Instructions (Signed)

## 2021-06-07 ENCOUNTER — Other Ambulatory Visit: Payer: Self-pay | Admitting: Internal Medicine

## 2021-06-07 NOTE — Telephone Encounter (Signed)
Please refill as per office routine med refill policy (all routine meds refilled for 3 mo or monthly per pt preference up to one year from last visit, then month to month grace period for 3 mo, then further med refills will have to be denied)  

## 2021-06-11 ENCOUNTER — Other Ambulatory Visit: Payer: Self-pay | Admitting: Internal Medicine

## 2021-06-11 NOTE — Telephone Encounter (Signed)
Please refill as per office routine med refill policy (all routine meds to be refilled for 3 mo or monthly (per pt preference) up to one year from last visit, then month to month grace period for 3 mo, then further med refills will have to be denied) ? ?

## 2021-06-21 DIAGNOSIS — N281 Cyst of kidney, acquired: Secondary | ICD-10-CM | POA: Diagnosis not present

## 2021-06-21 DIAGNOSIS — N200 Calculus of kidney: Secondary | ICD-10-CM | POA: Diagnosis not present

## 2021-06-25 ENCOUNTER — Other Ambulatory Visit: Payer: Self-pay | Admitting: Internal Medicine

## 2021-06-25 NOTE — Telephone Encounter (Signed)
Please refill as per office routine med refill policy (all routine meds to be refilled for 3 mo or monthly (per pt preference) up to one year from last visit, then month to month grace period for 3 mo, then further med refills will have to be denied) ? ?

## 2021-07-10 ENCOUNTER — Other Ambulatory Visit: Payer: Self-pay | Admitting: Internal Medicine

## 2021-07-10 NOTE — Telephone Encounter (Signed)
Please refill as per office routine med refill policy (all routine meds to be refilled for 3 mo or monthly (per pt preference) up to one year from last visit, then month to month grace period for 3 mo, then further med refills will have to be denied) ? ?

## 2021-07-11 ENCOUNTER — Encounter: Payer: Self-pay | Admitting: Internal Medicine

## 2021-07-11 ENCOUNTER — Other Ambulatory Visit: Payer: Self-pay

## 2021-07-11 ENCOUNTER — Ambulatory Visit (INDEPENDENT_AMBULATORY_CARE_PROVIDER_SITE_OTHER): Payer: Medicare Other | Admitting: Internal Medicine

## 2021-07-11 VITALS — BP 122/62 | HR 67 | Temp 97.8°F | Resp 18 | Ht 73.0 in | Wt 249.0 lb

## 2021-07-11 DIAGNOSIS — E119 Type 2 diabetes mellitus without complications: Secondary | ICD-10-CM

## 2021-07-11 DIAGNOSIS — I10 Essential (primary) hypertension: Secondary | ICD-10-CM

## 2021-07-11 DIAGNOSIS — E7800 Pure hypercholesterolemia, unspecified: Secondary | ICD-10-CM | POA: Diagnosis not present

## 2021-07-11 DIAGNOSIS — Z87442 Personal history of urinary calculi: Secondary | ICD-10-CM

## 2021-07-11 DIAGNOSIS — R972 Elevated prostate specific antigen [PSA]: Secondary | ICD-10-CM

## 2021-07-11 DIAGNOSIS — I2510 Atherosclerotic heart disease of native coronary artery without angina pectoris: Secondary | ICD-10-CM

## 2021-07-11 LAB — URINALYSIS, ROUTINE W REFLEX MICROSCOPIC
Bilirubin Urine: NEGATIVE
Hgb urine dipstick: NEGATIVE
Ketones, ur: NEGATIVE
Leukocytes,Ua: NEGATIVE
Nitrite: NEGATIVE
RBC / HPF: NONE SEEN (ref 0–?)
Specific Gravity, Urine: 1.025 (ref 1.000–1.030)
Total Protein, Urine: NEGATIVE
Urine Glucose: NEGATIVE
Urobilinogen, UA: 1 (ref 0.0–1.0)
pH: 6 (ref 5.0–8.0)

## 2021-07-11 LAB — HEPATIC FUNCTION PANEL
ALT: 16 U/L (ref 0–53)
AST: 17 U/L (ref 0–37)
Albumin: 4.3 g/dL (ref 3.5–5.2)
Alkaline Phosphatase: 88 U/L (ref 39–117)
Bilirubin, Direct: 0.1 mg/dL (ref 0.0–0.3)
Total Bilirubin: 0.6 mg/dL (ref 0.2–1.2)
Total Protein: 6.8 g/dL (ref 6.0–8.3)

## 2021-07-11 LAB — BASIC METABOLIC PANEL
BUN: 19 mg/dL (ref 6–23)
CO2: 28 mEq/L (ref 19–32)
Calcium: 9 mg/dL (ref 8.4–10.5)
Chloride: 103 mEq/L (ref 96–112)
Creatinine, Ser: 0.94 mg/dL (ref 0.40–1.50)
GFR: 79.47 mL/min (ref >60.00)
Glucose, Bld: 109 mg/dL — ABNORMAL HIGH (ref 70–99)
Potassium: 4.7 mEq/L (ref 3.5–5.1)
Sodium: 139 mEq/L (ref 135–145)

## 2021-07-11 LAB — LIPID PANEL
Cholesterol: 97 mg/dL (ref 0–200)
HDL: 41.2 mg/dL (ref >39.00)
LDL Cholesterol: 41 mg/dL (ref 0–99)
NonHDL: 55.83
Total CHOL/HDL Ratio: 2
Triglycerides: 76 mg/dL (ref 0.0–149.0)
VLDL: 15.2 mg/dL (ref 0.0–40.0)

## 2021-07-11 LAB — PSA: PSA: 2.24 ng/mL (ref 0.10–4.00)

## 2021-07-11 LAB — HEMOGLOBIN A1C: Hgb A1c MFr Bld: 6.8 % — ABNORMAL HIGH (ref 4.6–6.5)

## 2021-07-11 NOTE — Patient Instructions (Signed)
You had the flu shot today  Please continue all other medications as before, and refills have been done if requested.  Please have the pharmacy call with any other refills you may need.  Please continue your efforts at being more active, low cholesterol diet, and weight control.  Please keep your appointments with your specialists as you may have planned  Please go to the LAB at the blood drawing area for the tests to be done  You will be contacted by phone if any changes need to be made immediately.  Otherwise, you will receive a letter about your results with an explanation, but please check with MyChart first.  Please remember to sign up for MyChart if you have not done so, as this will be important to you in the future with finding out test results, communicating by private email, and scheduling acute appointments online when needed.  Please make an Appointment to return in 6 months, or sooner if needed 

## 2021-07-11 NOTE — Progress Notes (Signed)
Patient ID: Allen Myers, male   DOB: 07-04-1946, 75 y.o.   MRN: [id number]        Chief Complaint: follow up Dm, HLD, AKI and elevated PSA with renal stones       HPI:  Allen Myers is a 75 y.o. male here with complicated hx of recurring renal stones and urology f/u after lithotrypsy apr 2022, stable about 1 mo later to then f/u with urology at 1 yr, Denies urinary symptoms such as dysuria, frequency, urgency, flank pain, hematuria or n/v, fever, chills.  Pt denies chest pain, increased sob or doe, wheezing, orthopnea, PND, increased LE swelling, palpitations, dizziness or syncope.   Pt denies polydipsia, polyuria, or new focal neuro s/s.   Pt denies fever, wt loss, night sweats, loss of appetite, or other constitutional symptoms  Did have recent elevated Cr and PSA with labs.        Wt Readings from Last 3 Encounters:  07/11/21 249 lb (112.9 kg)  05/24/21 249 lb (112.9 kg)  02/20/21 257 lb (116.6 kg)   BP Readings from Last 3 Encounters:  07/11/21 122/62  05/24/21 112/62  02/20/21 114/66         Past Medical History:  Diagnosis Date   Allergy    Arthritis    CAD (coronary artery disease)    Erectile dysfunction    History of colon polyps    History of diverticulitis    HYPERTENSION    Hypothyroidism    Kidney stones    "passed it"   Melanoma ([place]) 12/10/2019   Peripheral arterial disease ([place])    SAH (subarachnoid hemorrhage) ([place])    Following syncopal episode   SDH (subdural hematoma) ([place])    Following syncopal episode   Seizures ([place])    08/2014 - controlled   Sleep apnea    "mild"  does not use CPAP   Sleep apnea in adult    Type II diabetes mellitus ([place])    type 2   Past Surgical History:  Procedure Laterality Date   COLONOSCOPY  2014   polyp   CORONARY ANGIOPLASTY WITH STENT PLACEMENT  05/11/2014   "1"   CRANIOTOMY Left 09/12/2014   Procedure: CRANIOTOMY HEMATOMA EVACUATION SUBDURAL;  Surgeon: Erline Levine, MD;  Location: [place] [facility];  Service:  Neurosurgery;  Laterality: Left;   EXTRACORPOREAL SHOCK WAVE LITHOTRIPSY Left 01/08/2021   Procedure: EXTRACORPOREAL SHOCK WAVE LITHOTRIPSY (ESWL);  Surgeon: Raynelle Bring, MD;  Location: [facility];  Service: Urology;  Laterality: Left;   FRACTIONAL FLOW RESERVE WIRE  05/11/2014   Procedure: FRACTIONAL FLOW RESERVE WIRE;  Surgeon: [name], MD;  Location: [facility] CATH LAB;  Service: Cardiovascular;;   LEFT HEART CATHETERIZATION WITH CORONARY ANGIOGRAM N/A 05/11/2014   Procedure: LEFT HEART CATHETERIZATION WITH CORONARY ANGIOGRAM;  Surgeon: [name], MD;  Location[facility] CATH LAB;  Service: Cardiovascular;  Laterality: N/A;   POLYPECTOMY      reports that he quit smoking about 37 years ago. His smoking use included cigarettes. He has a 30.00 pack-year smoking history. He has never used smokeless tobacco. He reports that he does not drink alcohol and does not use drugs. family history includes AAA (abdominal aortic aneurysm) in his father; Aplastic anemia in his sister; Ataxia in his maternal grandmother; Dementia in his maternal grandmother; Heart disease in his father and mother; Lung cancer in his cousin and father; Prostate cancer in his brother. No Known Allergies Current Outpatient Medications on File Prior  to Visit  Medication Sig Dispense Refill   Ascorbic Acid (VITAMIN C PO) Take 1 tablet by mouth.     atorvastatin (LIPITOR) 80 MG tablet TAKE 1 TABLET DAILY 90 tablet 2   carvedilol (COREG) 3.125 MG tablet TAKE 1 TABLET TWICE DAILY  WITH MEALS 180 tablet 3   levETIRAcetam (KEPPRA) 1000 MG tablet Take 1 tablet (1,000 mg total) by mouth 2 (two) times daily. 180 tablet 3   levothyroxine (SYNTHROID) 137 MCG tablet TAKE 1 TABLET DAILY BEFORE BREAKFAST 90 tablet 3   lisinopril (ZESTRIL) 2.5 MG tablet TAKE 1 TABLET DAILY 90 tablet 3   meloxicam (MOBIC) 15 MG tablet Take 15 mg by mouth daily.     metFORMIN (GLUCOPHAGE) 1000 MG tablet TAKE 1 TABLET TWICE DAILY  WITH MEALS  180 tablet 1   omeprazole (PRILOSEC) 40 MG capsule TAKE 1 CAPSULE DAILY 90 capsule 2   ONE TOUCH ULTRA TEST test strip USE TO CHECK BLOOD SUGAR TWICE A DAY AS DIRECTED 100 each 0   pioglitazone (ACTOS) 45 MG tablet TAKE 1 TABLET DAILY 90 tablet 3   tamsulosin (FLOMAX) 0.4 MG CAPS capsule Take 1 capsule (0.4 mg total) by mouth at bedtime. 14 capsule 0   TRADJENTA 5 MG TABS tablet TAKE 1 TABLET DAILY 90 tablet 1   valACYclovir (VALTREX) 1000 MG tablet Take 1 tablet (1,000 mg total) by mouth 2 (two) times daily as needed. 20 tablet 5   vitamin B-12 (CYANOCOBALAMIN) 1000 MCG tablet Take 1 tablet (1,000 mcg total) by mouth daily. 90 tablet 1   No current facility-administered medications on file prior to visit.        ROS:  All others reviewed and negative.  Objective        PE:  BP 122/62   Pulse 67   Temp 97.8 F (36.6 C) (Oral)   Resp 18   Ht 6\' 1"  (1.854 m)   Wt 249 lb (112.9 kg)   SpO2 96%   BMI 32.85 kg/m                 Constitutional: Pt appears in NAD               HENT: Head: NCAT.                Right Ear: External ear normal.                 Left Ear: External ear normal.                Eyes: . Pupils are equal, round, and reactive to light. Conjunctivae and EOM are normal               Nose: without d/c or deformity               Neck: Neck supple. Gross normal ROM               Cardiovascular: Normal rate and regular rhythm.                 Pulmonary/Chest: Effort normal and breath sounds without rales or wheezing.                Abd:  Soft, NT, ND, + BS, no organomegaly               Neurological: Pt is alert. At baseline orientation, motor grossly intact  Skin: Skin is warm. No rashes, no other new lesions, LE edema - none               Psychiatric: Pt behavior is normal without agitation   Micro: none  Cardiac tracings I have personally interpreted today:  none  Pertinent Radiological findings (summarize): none   Lab Results  Component Value  Date   WBC 8.8 01/11/2021   HGB 13.9 01/11/2021   HCT 40.9 01/11/2021   PLT 130.0 (L) 01/11/2021   GLUCOSE 109 (H) 07/11/2021   CHOL 97 07/11/2021   TRIG 76.0 07/11/2021   HDL 41.20 07/11/2021   LDLDIRECT 85.7 06/11/2013   LDLCALC 41 07/11/2021   ALT 16 07/11/2021   AST 17 07/11/2021   NA 139 07/11/2021   K 4.7 07/11/2021   CL 103 07/11/2021   CREATININE 0.94 07/11/2021   BUN 19 07/11/2021   CO2 28 07/11/2021   TSH 2.65 01/11/2021   PSA 2.24 07/11/2021   INR 1.07 09/06/2014   HGBA1C 6.8 (H) 07/11/2021   MICROALBUR 24.2 (H) 01/11/2021   Assessment/Plan:  EASTIN SWING is a 75 y.o. White or Caucasian [1] male with  has a past medical history of Allergy, Arthritis, CAD (coronary artery disease), Erectile dysfunction, History of colon polyps, History of diverticulitis, HYPERTENSION, Hypothyroidism, Kidney stones, Melanoma ([place]) (12/10/2019), Peripheral arterial disease ([place]), SAH (subarachnoid hemorrhage) ([place]), SDH (subdural hematoma) ([place]), Seizures ([place]), Sleep apnea, Sleep apnea in adult, and Type II diabetes mellitus ([place]).  Elevated PSA Lab Results  Component Value Date   PSA 2.24 07/11/2021   PSA 3.44 01/11/2021   PSA 2.06 12/10/2019   Improved after recent elevated with active renal stones passing, to cont to f/u next visit  HLD (hyperlipidemia) Lab Results  Component Value Date   LDLCALC 41 07/11/2021   Stable, pt to continue current statin lipitor 80   Essential hypertension BP Readings from Last 3 Encounters:  07/11/21 122/62  05/24/21 112/62  02/20/21 114/66   Stable, pt to continue medical treatment coreg, zestril   Diabetes mellitus type 2, noninsulin dependent ([place]) Lab Results  Component Value Date   HGBA1C 6.8 (H) 07/11/2021   Mild uncontrolled,, pt to continue current medical treatment metformin, actos, tradjenta   History of kidney stones Now stablized, cont to follow  Followup: No follow-ups on file.  Cathlean Cower, MD 07/12/2021 10:46  PM [facility]

## 2021-07-12 DIAGNOSIS — R972 Elevated prostate specific antigen [PSA]: Secondary | ICD-10-CM | POA: Insufficient documentation

## 2021-07-12 NOTE — Assessment & Plan Note (Signed)
Lab Results  Component Value Date   HGBA1C 6.8 (H) 07/11/2021   Mild uncontrolled,, pt to continue current medical treatment metformin, actos, tradjenta

## 2021-07-12 NOTE — Assessment & Plan Note (Signed)
Lab Results  Component Value Date   LDLCALC 41 07/11/2021   Stable, pt to continue current statin lipitor 80

## 2021-07-12 NOTE — Assessment & Plan Note (Signed)
Now stablized, cont to follow

## 2021-07-12 NOTE — Assessment & Plan Note (Signed)
Lab Results  Component Value Date   PSA 2.24 07/11/2021   PSA 3.44 01/11/2021   PSA 2.06 12/10/2019   Improved after recent elevated with active renal stones passing, to cont to f/u next visit

## 2021-07-12 NOTE — Assessment & Plan Note (Signed)
BP Readings from Last 3 Encounters:  07/11/21 122/62  05/24/21 112/62  02/20/21 114/66   Stable, pt to continue medical treatment coreg, zestril

## 2021-07-23 ENCOUNTER — Other Ambulatory Visit: Payer: Self-pay | Admitting: Internal Medicine

## 2021-07-30 ENCOUNTER — Encounter: Payer: Self-pay | Admitting: Internal Medicine

## 2021-07-30 ENCOUNTER — Telehealth: Payer: Self-pay | Admitting: Internal Medicine

## 2021-07-30 NOTE — Telephone Encounter (Signed)
Please advise as I do not see this medication on patients list 

## 2021-07-30 NOTE — Telephone Encounter (Signed)
1.Medication Requested:  Cilostazol 100 MG BID 2. Pharmacy (Name, Street, [place]): [facility] [place] to [facility] [address] Phone:  [phone]  Fax:  [phone]     3. On Med List: N  4. Last Visit with PCP: 07/11/2021  5. Next visit date with PCP: n/a   Agent: Please be advised that RX refills may take up to 3 business days. We ask that you follow-up with your pharmacy.   Patient was unaware that the medication was d/c on our side. Requesting a callback to verify whether he should d/c or continue to take it.

## 2021-07-30 NOTE — Telephone Encounter (Signed)
This medication was stopped on hospital d/c earlier this yr, so I dont think refill is needed

## 2021-07-31 MED ORDER — CILOSTAZOL 100 MG PO TABS: 100.0000 mg | ORAL_TABLET | Freq: Two times a day (BID) | ORAL | 1 refills | Status: DC

## 2021-08-21 ENCOUNTER — Other Ambulatory Visit: Payer: Self-pay | Admitting: Internal Medicine

## 2021-08-21 NOTE — Telephone Encounter (Signed)
Please refill as per office routine med refill policy (all routine meds to be refilled for 3 mo or monthly (per pt preference) up to one year from last visit, then month to month grace period for 3 mo, then further med refills will have to be denied) ? ?

## 2021-08-23 DIAGNOSIS — Z85828 Personal history of other malignant neoplasm of skin: Secondary | ICD-10-CM | POA: Diagnosis not present

## 2021-08-23 DIAGNOSIS — D044 Carcinoma in situ of skin of scalp and neck: Secondary | ICD-10-CM | POA: Diagnosis not present

## 2021-08-23 DIAGNOSIS — C4442 Squamous cell carcinoma of skin of scalp and neck: Secondary | ICD-10-CM | POA: Diagnosis not present

## 2021-08-23 DIAGNOSIS — L570 Actinic keratosis: Secondary | ICD-10-CM | POA: Diagnosis not present

## 2021-09-11 DIAGNOSIS — Z85828 Personal history of other malignant neoplasm of skin: Secondary | ICD-10-CM | POA: Diagnosis not present

## 2021-09-11 DIAGNOSIS — C4442 Squamous cell carcinoma of skin of scalp and neck: Secondary | ICD-10-CM | POA: Diagnosis not present

## 2021-09-25 ENCOUNTER — Other Ambulatory Visit: Payer: Self-pay | Admitting: Internal Medicine

## 2021-09-25 NOTE — Telephone Encounter (Signed)
Please refill as per office routine med refill policy (all routine meds to be refilled for 3 mo or monthly (per pt preference) up to one year from last visit, then month to month grace period for 3 mo, then further med refills will have to be denied) ? ?

## 2021-11-05 DIAGNOSIS — D225 Melanocytic nevi of trunk: Secondary | ICD-10-CM | POA: Diagnosis not present

## 2021-11-05 DIAGNOSIS — Z85820 Personal history of malignant melanoma of skin: Secondary | ICD-10-CM | POA: Diagnosis not present

## 2021-11-05 DIAGNOSIS — L570 Actinic keratosis: Secondary | ICD-10-CM | POA: Diagnosis not present

## 2021-11-05 DIAGNOSIS — Z85828 Personal history of other malignant neoplasm of skin: Secondary | ICD-10-CM | POA: Diagnosis not present

## 2021-11-05 DIAGNOSIS — D1801 Hemangioma of skin and subcutaneous tissue: Secondary | ICD-10-CM | POA: Diagnosis not present

## 2021-11-05 DIAGNOSIS — L821 Other seborrheic keratosis: Secondary | ICD-10-CM | POA: Diagnosis not present

## 2021-11-09 ENCOUNTER — Other Ambulatory Visit: Payer: Self-pay | Admitting: Internal Medicine

## 2021-11-09 NOTE — Telephone Encounter (Signed)
Please refill as per office routine med refill policy (all routine meds to be refilled for 3 mo or monthly (per pt preference) up to one year from last visit, then month to month grace period for 3 mo, then further med refills will have to be denied) ? ?

## 2021-12-18 DIAGNOSIS — U071 COVID-19: Secondary | ICD-10-CM | POA: Diagnosis not present

## 2021-12-31 ENCOUNTER — Ambulatory Visit (HOSPITAL_COMMUNITY)
Admission: RE | Admit: 2021-12-31 | Discharge: 2021-12-31 | Disposition: A | Discharge status: Home / Self Care | Payer: Medicare Other | Source: Ambulatory Visit | Attending: Cardiology | Admitting: Cardiology

## 2021-12-31 ENCOUNTER — Other Ambulatory Visit (HOSPITAL_COMMUNITY): Payer: Self-pay | Admitting: Family

## 2021-12-31 ENCOUNTER — Other Ambulatory Visit: Payer: Self-pay

## 2021-12-31 DIAGNOSIS — I7141 Pararenal abdominal aortic aneurysm, without rupture: Secondary | ICD-10-CM | POA: Diagnosis not present

## 2022-01-04 ENCOUNTER — Encounter: Payer: Self-pay | Admitting: *Deleted

## 2022-02-01 ENCOUNTER — Encounter: Payer: Self-pay | Admitting: Internal Medicine

## 2022-02-01 ENCOUNTER — Ambulatory Visit (INDEPENDENT_AMBULATORY_CARE_PROVIDER_SITE_OTHER): Payer: Medicare Other | Admitting: Internal Medicine

## 2022-02-01 VITALS — BP 102/58 | HR 63 | Temp 98.0°F | Ht 73.0 in | Wt 251.0 lb

## 2022-02-01 DIAGNOSIS — E119 Type 2 diabetes mellitus without complications: Secondary | ICD-10-CM

## 2022-02-01 DIAGNOSIS — E559 Vitamin D deficiency, unspecified: Secondary | ICD-10-CM

## 2022-02-01 DIAGNOSIS — R972 Elevated prostate specific antigen [PSA]: Secondary | ICD-10-CM | POA: Diagnosis not present

## 2022-02-01 DIAGNOSIS — E538 Deficiency of other specified B group vitamins: Secondary | ICD-10-CM | POA: Diagnosis not present

## 2022-02-01 MED ORDER — ATORVASTATIN CALCIUM 80 MG PO TABS: 80.0000 mg | ORAL_TABLET | Freq: Every day | ORAL | 3 refills | Status: DC

## 2022-02-01 MED ORDER — VALACYCLOVIR HCL 1 G PO TABS: 1000.0000 mg | ORAL_TABLET | Freq: Two times a day (BID) | ORAL | 5 refills | Status: DC | PRN

## 2022-02-01 MED ORDER — METFORMIN HCL 1000 MG PO TABS: 1000.0000 mg | ORAL_TABLET | Freq: Two times a day (BID) | ORAL | 3 refills | Status: DC

## 2022-02-01 MED ORDER — PIOGLITAZONE HCL 45 MG PO TABS: 45.0000 mg | ORAL_TABLET | Freq: Every day | ORAL | 3 refills | Status: DC

## 2022-02-01 MED ORDER — LISINOPRIL 2.5 MG PO TABS
2.5000 mg | ORAL_TABLET | Freq: Every day | ORAL | 3 refills | Status: DC
Start: 2022-02-01 — End: 2022-02-01

## 2022-02-01 MED ORDER — CARVEDILOL 3.125 MG PO TABS: 3.1250 mg | ORAL_TABLET | Freq: Two times a day (BID) | ORAL | 3 refills | Status: DC

## 2022-02-01 MED ORDER — LEVOTHYROXINE SODIUM 137 MCG PO TABS: 137.0000 ug | ORAL_TABLET | Freq: Every day | ORAL | 3 refills | Status: DC

## 2022-02-01 MED ORDER — OMEPRAZOLE 40 MG PO CPDR: 40.0000 mg | DELAYED_RELEASE_CAPSULE | Freq: Every day | ORAL | 3 refills | Status: DC

## 2022-02-01 MED ORDER — OZEMPIC (0.25 OR 0.5 MG/DOSE) 2 MG/1.5ML ~~LOC~~ SOPN: 0.5000 mg | PEN_INJECTOR | SUBCUTANEOUS | 3 refills | Status: DC

## 2022-02-01 MED ORDER — LISINOPRIL 2.5 MG PO TABS: 2.5000 mg | ORAL_TABLET | Freq: Every day | ORAL | 3 refills | Status: DC

## 2022-02-01 MED ORDER — CILOSTAZOL 100 MG PO TABS: 100.0000 mg | ORAL_TABLET | Freq: Two times a day (BID) | ORAL | 3 refills | Status: DC

## 2022-02-01 NOTE — Progress Notes (Signed)
Patient ID: Allen Myers, male   DOB: [birth year], 76 y.o.   MRN: [id number] ? ? ? ?    Chief Complaint: follow up HTN, low vit d and B12, increased psa ? ?     HPI:  Allen Myers is a 76 y.o. male here overall doing ok.  Pt denies chest pain, increased sob or doe, wheezing, orthopnea, PND, increased LE swelling, palpitations, dizziness or syncope.   Pt denies polydipsia, polyuria, or new focal neuro s/s.    Pt denies fever, wt loss, night sweats, loss of appetite, or other constitutional symptoms  Not taking Vit D and B12.  Denies urinary symptoms such as dysuria, frequency, urgency, flank pain, hematuria or n/v, fever, chills.   ?      ?Wt Readings from Last 3 Encounters:  ?02/01/22 251 lb (113.9 kg)  ?07/11/21 249 lb (112.9 kg)  ?05/24/21 249 lb (112.9 kg)  ? ?BP Readings from Last 3 Encounters:  ?02/01/22 (!) 102/58  ?07/11/21 122/62  ?05/24/21 112/62  ? ?      ?Past Medical History:  ?Diagnosis Date  ? Allergy   ? Arthritis   ? CAD (coronary artery disease)   ? Erectile dysfunction   ? History of colon polyps   ? History of diverticulitis   ? HYPERTENSION   ? Hypothyroidism   ? Kidney stones   ? "passed it"  ? Melanoma ([place]) 12/10/2019  ? Peripheral arterial disease ([place])   ? SAH (subarachnoid hemorrhage) ([place])   ? Following syncopal episode  ? SDH (subdural hematoma) ([facility])   ? Following syncopal episode  ? Seizures ([place])   ? 08/2014 - controlled  ? Sleep apnea   ? "mild"  does not use CPAP  ? Sleep apnea in adult   ? Type II diabetes mellitus ([place])   ? type 2  ? ?Past Surgical History:  ?Procedure Laterality Date  ? COLONOSCOPY  2014  ? polyp  ? CORONARY ANGIOPLASTY WITH STENT PLACEMENT  05/11/2014  ? "1"  ? CRANIOTOMY Left 09/12/2014  ? Procedure: CRANIOTOMY HEMATOMA EVACUATION SUBDURAL;  Surgeon: Erline Levine, MD;  Location: [facility];  Service: Neurosurgery;  Laterality: Left;  ? EXTRACORPOREAL SHOCK WAVE LITHOTRIPSY Left 01/08/2021  ? Procedure: EXTRACORPOREAL SHOCK WAVE LITHOTRIPSY (ESWL);  Surgeon:  Raynelle Bring, MD;  Location: [facility];  Service: Urology;  Laterality: Left;  ? FRACTIONAL FLOW RESERVE WIRE  05/11/2014  ? Procedure: FRACTIONAL FLOW RESERVE WIRE;  Surgeon: [name], MD;  Location: [facility] CATH LAB;  Service: Cardiovascular;;  ? LEFT HEART CATHETERIZATION WITH CORONARY ANGIOGRAM N/A 05/11/2014  ? Procedure: LEFT HEART CATHETERIZATION WITH CORONARY ANGIOGRAM;  Surgeon: [name], MD;  Location: [facility] CATH LAB;  Service: Cardiovascular;  Laterality: N/A;  ? POLYPECTOMY    ? ? reports that he quit smoking about 38 years ago. His smoking use included cigarettes. He has a 30.00 pack-year smoking history. He has never used smokeless tobacco. He reports that he does not drink alcohol and does not use drugs. ?family history includes AAA (abdominal aortic aneurysm) in his father; Aplastic anemia in his sister; Ataxia in his maternal grandmother; Dementia in his maternal grandmother; Heart disease in his father and mother; Lung cancer in his cousin and father; Prostate cancer in his brother. ?No Known Allergies ?Current Outpatient Medications on File Prior to Visit  ?Medication Sig Dispense Refill  ? Ascorbic Acid (VITAMIN C PO) Take 1 tablet by mouth.    ? levETIRAcetam (  KEPPRA) 1000 MG tablet Take 1 tablet (1,000 mg total) by mouth 2 (two) times daily. 180 tablet 3  ? meloxicam (MOBIC) 15 MG tablet Take 15 mg by mouth daily.    ? TRADJENTA 5 MG TABS tablet TAKE 1 TABLET DAILY 90 tablet 1  ? vitamin B-12 (CYANOCOBALAMIN) 1000 MCG tablet Take 1 tablet (1,000 mcg total) by mouth daily. 90 tablet 1  ? ?No current facility-administered medications on file prior to visit.  ? ?     ROS:  All others reviewed and negative. ? ?Objective  ? ?     PE:  BP (!) 102/58 (BP Location: Right Arm, Patient Position: Sitting, Cuff Size: Large)   Pulse 63   Temp 98 ?F (36.7 ?C) (Oral)   Ht '6\' 1"'$  (1.854 m)   Wt 251 lb (113.9 kg)   SpO2 94%   BMI 33.12 kg/m?  ? ?              Constitutional: Pt  appears in NAD ?              HENT: Head: NCAT.  ?              Right Ear: External ear normal.   ?              Left Ear: External ear normal.  ?              Eyes: . Pupils are equal, round, and reactive to light. Conjunctivae and EOM are normal ?              Nose: without d/c or deformity ?              Neck: Neck supple. Gross normal ROM ?              Cardiovascular: Normal rate and regular rhythm.   ?              Pulmonary/Chest: Effort normal and breath sounds without rales or wheezing.  ?              Abd:  Soft, NT, ND, + BS, no organomegaly ?              Neurological: Pt is alert. At baseline orientation, motor grossly intact ?              Skin: Skin is warm. No rashes, no other new lesions, LE edema - none ?              Psychiatric: Pt behavior is normal without agitation  ? ?Micro: none ? ?Cardiac tracings I have personally interpreted today:  none ? ?Pertinent Radiological findings (summarize): none  ? ?Lab Results  ?Component Value Date  ? WBC 8.8 01/11/2021  ? HGB 13.9 01/11/2021  ? HCT 40.9 01/11/2021  ? PLT 130.0 (L) 01/11/2021  ? GLUCOSE 109 (H) 07/11/2021  ? CHOL 97 07/11/2021  ? TRIG 76.0 07/11/2021  ? HDL 41.20 07/11/2021  ? LDLDIRECT 85.7 06/11/2013  ? [place] 41 07/11/2021  ? ALT 16 07/11/2021  ? AST 17 07/11/2021  ? NA 139 07/11/2021  ? K 4.7 07/11/2021  ? CL 103 07/11/2021  ? CREATININE 0.94 07/11/2021  ? BUN 19 07/11/2021  ? CO2 28 07/11/2021  ? TSH 2.65 01/11/2021  ? PSA 2.24 07/11/2021  ? INR 1.07 09/06/2014  ? HGBA1C 6.8 (H) 07/11/2021  ? MICROALBUR 24.2 (H) 01/11/2021  ? ?Assessment/Plan:  ?[name] is a  76 y.o. White or Caucasian [1] male with  has a past medical history of Allergy, Arthritis, CAD (coronary artery disease), Erectile dysfunction, History of colon polyps, History of diverticulitis, HYPERTENSION, Hypothyroidism, Kidney stones, Melanoma ([place]) (12/10/2019), Peripheral arterial disease ([place]), SAH (subarachnoid hemorrhage) ([place]), SDH (subdural hematoma) ([place]),  Seizures ([place]), Sleep apnea, Sleep apnea in adult, and Type II diabetes mellitus ([place]). ? ?B12 deficiency ?Lab Results  ?Component Value Date  ? [id number] 1,194 (H) 01/11/2021  ? ?Stable, cont oral replacement - b12 1000 mcg qd ? ? ?Vitamin D deficiency ?Last vitamin D ?Lab Results  ?Component Value Date  ? VD25OH 32.80 01/11/2021  ? ?Otherwise, to start oral replacement ? ? ?Increased prostate specific antigen (PSA) velocity ?Lab Results  ?Component Value Date  ? PSA 2.24 07/11/2021  ? PSA 3.44 01/11/2021  ? PSA 2.06 12/10/2019  ? ?Improved, asympt, cont to follow ? ?Diabetes mellitus type 2, noninsulin dependent ([place]) ?Lab Results  ?Component Value Date  ? HGBA1C 6.8 (H) 07/11/2021  ? ?Mild uncontrolled, pt to continue current medical treatment tradjenta except to stop this if able to change to ozempic for better sugar and wt control ? ?Followup: Return in about 6 months (around 08/03/2022). ? ?Cathlean Cower, MD 02/05/2022 8:17 PM ?[place] ?[place] ?Internal Medicine ?

## 2022-02-01 NOTE — Patient Instructions (Addendum)
Ok to stop the tradjenta If you are able to start the ozempic ? ?Please continue all other medications as before, and refills have been done if requested. ? ?Please have the pharmacy call with any other refills you may need. ? ?Please continue your efforts at being more active, low cholesterol diet, and weight control. ? ?You are otherwise up to date with prevention measures today. ? ?Please keep your appointments with your specialists as you may have planned ? ?Please go to the LAB at the blood drawing area for the tests to be done ? ?You will be contacted by phone if any changes need to be made immediately.  Otherwise, you will receive a letter about your results with an explanation, but please check with MyChart first. ? ?Please remember to sign up for MyChart if you have not done so, as this will be important to you in the future with finding out test results, communicating by private email, and scheduling acute appointments online when needed. ? ?Please make an Appointment to return in 6 months, or sooner if needed ?

## 2022-02-01 NOTE — Assessment & Plan Note (Signed)
Lab Results  ?Component Value Date  ? [id number] 1,194 (H) 01/11/2021  ? ?Stable, cont oral replacement - b12 1000 mcg qd ? ?

## 2022-02-05 ENCOUNTER — Encounter: Payer: Self-pay | Admitting: Internal Medicine

## 2022-02-05 DIAGNOSIS — E559 Vitamin D deficiency, unspecified: Secondary | ICD-10-CM | POA: Insufficient documentation

## 2022-02-05 DIAGNOSIS — R972 Elevated prostate specific antigen [PSA]: Secondary | ICD-10-CM | POA: Insufficient documentation

## 2022-02-05 NOTE — Assessment & Plan Note (Signed)
Lab Results  ?Component Value Date  ? PSA 2.24 07/11/2021  ? PSA 3.44 01/11/2021  ? PSA 2.06 12/10/2019  ? ?Improved, asympt, cont to follow ?

## 2022-02-05 NOTE — Assessment & Plan Note (Signed)
Lab Results  ?Component Value Date  ? HGBA1C 6.8 (H) 07/11/2021  ? ?Mild uncontrolled, pt to continue current medical treatment tradjenta except to stop this if able to change to ozempic for better sugar and wt control ? ?

## 2022-02-05 NOTE — Assessment & Plan Note (Signed)
Last vitamin D ?Lab Results  ?Component Value Date  ? VD25OH 32.80 01/11/2021  ? ?Otherwise, to start oral replacement ? ?

## 2022-02-11 ENCOUNTER — Telehealth: Payer: Self-pay | Admitting: Internal Medicine

## 2022-02-11 ENCOUNTER — Encounter: Payer: Self-pay | Admitting: Internal Medicine

## 2022-02-11 NOTE — Telephone Encounter (Signed)
Pt requesting a cb regarding imaging order placed for wrist ?

## 2022-02-12 ENCOUNTER — Other Ambulatory Visit (INDEPENDENT_AMBULATORY_CARE_PROVIDER_SITE_OTHER): Payer: Medicare Other

## 2022-02-12 DIAGNOSIS — E538 Deficiency of other specified B group vitamins: Secondary | ICD-10-CM | POA: Diagnosis not present

## 2022-02-12 DIAGNOSIS — E559 Vitamin D deficiency, unspecified: Secondary | ICD-10-CM | POA: Diagnosis not present

## 2022-02-12 DIAGNOSIS — E119 Type 2 diabetes mellitus without complications: Secondary | ICD-10-CM | POA: Diagnosis not present

## 2022-02-12 DIAGNOSIS — R972 Elevated prostate specific antigen [PSA]: Secondary | ICD-10-CM | POA: Diagnosis not present

## 2022-02-12 LAB — LIPID PANEL
Cholesterol: 102 mg/dL (ref 0–200)
HDL: 41.1 mg/dL (ref >39.00)
LDL Cholesterol: 41 mg/dL (ref 0–99)
NonHDL: 61.12
Total CHOL/HDL Ratio: 2
Triglycerides: 99 mg/dL (ref 0.0–149.0)
VLDL: 19.8 mg/dL (ref 0.0–40.0)

## 2022-02-12 LAB — BASIC METABOLIC PANEL
BUN: 18 mg/dL (ref 6–23)
CO2: 26 mEq/L (ref 19–32)
Calcium: 8.8 mg/dL (ref 8.4–10.5)
Chloride: 103 mEq/L (ref 96–112)
Creatinine, Ser: 1.09 mg/dL (ref 0.40–1.50)
GFR: 66.26 mL/min (ref >60.00)
Glucose, Bld: 136 mg/dL — ABNORMAL HIGH (ref 70–99)
Potassium: 4.7 mEq/L (ref 3.5–5.1)
Sodium: 138 mEq/L (ref 135–145)

## 2022-02-12 LAB — MICROALBUMIN / CREATININE URINE RATIO
Creatinine,U: 77 mg/dL
Microalb Creat Ratio: 1.3 mg/g (ref 0.0–30.0)
Microalb, Ur: 1 mg/dL (ref 0.0–1.9)

## 2022-02-12 LAB — URINALYSIS, ROUTINE W REFLEX MICROSCOPIC
Bilirubin Urine: NEGATIVE
Hgb urine dipstick: NEGATIVE
Ketones, ur: NEGATIVE
Leukocytes,Ua: NEGATIVE
Nitrite: NEGATIVE
RBC / HPF: NONE SEEN (ref 0–?)
Specific Gravity, Urine: 1.015 (ref 1.000–1.030)
Total Protein, Urine: NEGATIVE
Urine Glucose: NEGATIVE
Urobilinogen, UA: 1 (ref 0.0–1.0)
pH: 7 (ref 5.0–8.0)

## 2022-02-12 LAB — CBC WITH DIFFERENTIAL/PLATELET
Basophils Absolute: 0.1 10*3/uL (ref 0.0–0.1)
Basophils Relative: 0.8 % (ref 0.0–3.0)
Eosinophils Absolute: 0.3 10*3/uL (ref 0.0–0.7)
Eosinophils Relative: 3.7 % (ref 0.0–5.0)
HCT: 41.2 % (ref 39.0–52.0)
Hemoglobin: 13.8 g/dL (ref 13.0–17.0)
Lymphocytes Relative: 19.7 % (ref 12.0–46.0)
Lymphs Abs: 1.3 10*3/uL (ref 0.7–4.0)
MCHC: 33.5 g/dL (ref 30.0–36.0)
MCV: 90.1 fl (ref 78.0–100.0)
Monocytes Absolute: 0.6 10*3/uL (ref 0.1–1.0)
Monocytes Relative: 9 % (ref 3.0–12.0)
Neutro Abs: 4.6 10*3/uL (ref 1.4–7.7)
Neutrophils Relative %: 66.8 % (ref 43.0–77.0)
Platelets: 127 10*3/uL — ABNORMAL LOW (ref 150.0–400.0)
RBC: 4.58 Mil/uL (ref 4.22–5.81)
RDW: 14.9 % (ref 11.5–15.5)
WBC: 6.8 10*3/uL (ref 4.0–10.5)

## 2022-02-12 LAB — VITAMIN D 25 HYDROXY (VIT D DEFICIENCY, FRACTURES): VITD: 36.46 ng/mL (ref 30.00–100.00)

## 2022-02-12 LAB — HEPATIC FUNCTION PANEL
ALT: 18 U/L (ref 0–53)
AST: 17 U/L (ref 0–37)
Albumin: 4.1 g/dL (ref 3.5–5.2)
Alkaline Phosphatase: 120 U/L — ABNORMAL HIGH (ref 39–117)
Bilirubin, Direct: 0.1 mg/dL (ref 0.0–0.3)
Total Bilirubin: 0.6 mg/dL (ref 0.2–1.2)
Total Protein: 6.7 g/dL (ref 6.0–8.3)

## 2022-02-12 LAB — PSA: PSA: 2.39 ng/mL (ref 0.10–4.00)

## 2022-02-12 LAB — VITAMIN B12: Vitamin B-12: 1343 pg/mL — ABNORMAL HIGH (ref 211–911)

## 2022-02-12 LAB — HEMOGLOBIN A1C: Hgb A1c MFr Bld: 6.9 % — ABNORMAL HIGH (ref 4.6–6.5)

## 2022-02-12 LAB — TSH: TSH: 0.36 u[IU]/mL (ref 0.35–5.50)

## 2022-02-13 NOTE — Telephone Encounter (Signed)
Duplicate encounter. Please see mychart message ?

## 2022-02-26 ENCOUNTER — Ambulatory Visit: Payer: Medicare Other | Admitting: Adult Health

## 2022-02-27 ENCOUNTER — Ambulatory Visit (INDEPENDENT_AMBULATORY_CARE_PROVIDER_SITE_OTHER): Payer: Medicare Other

## 2022-02-27 DIAGNOSIS — Z0000 Encounter for general adult medical examination without abnormal findings: Secondary | ICD-10-CM | POA: Diagnosis not present

## 2022-02-27 NOTE — Patient Instructions (Signed)
[name] , ?Thank you for taking time to come for your Medicare Wellness Visit. I appreciate your ongoing commitment to your health goals. Please review the following plan we discussed and let me know if I can assist you in the future.  ? ?Screening recommendations/referrals: ?Colonoscopy: 01/14/2018 ?Recommended yearly ophthalmology/optometry visit for glaucoma screening and checkup ?Recommended yearly dental visit for hygiene and checkup ? ?Vaccinations: ?Influenza vaccine: due every Fall Season ?Pneumococcal vaccine: 08/10/2014, 08/23/2015 ?Tdap vaccine: 10/10/2010; due every 10 years (overdue); not covered by Medicare as preventative but will cover as treatment for an injury ?Shingles vaccine: never done   ?Covid-19: 12/30/2019, 01/24/2020, 07/29/2020 ? ?Advanced directives: Yes; Please bring a copy of your health care power of attorney and living will to the office at your convenience. ? ?Conditions/risks identified: Yes; Type II Diabetes Mellitus ? ?Next appointment: Please schedule your next Medicare Wellness Visit with your Nurse Health Advisor in 1 year by calling [phone]. ? ?Preventive Care 76 Years and Older, Male ?Preventive care refers to lifestyle choices and visits with your health care provider that can promote health and wellness. ?What does preventive care include? ?A yearly physical exam. This is also called an annual well check. ?Dental exams once or twice a year. ?Routine eye exams. Ask your health care provider how often you should have your eyes checked. ?Personal lifestyle choices, including: ?Daily care of your teeth and gums. ?Regular physical activity. ?Eating a healthy diet. ?Avoiding tobacco and drug use. ?Limiting alcohol use. ?Practicing safe sex. ?Taking low doses of aspirin every day. ?Taking vitamin and mineral supplements as recommended by your health care provider. ?What happens during an annual well check? ?The services and screenings done by your health care provider during  your annual well check will depend on your age, overall health, lifestyle risk factors, and family history of disease. ?Counseling  ?Your health care provider may ask you questions about your: ?Alcohol use. ?Tobacco use. ?Drug use. ?Emotional well-being. ?Home and relationship well-being. ?Sexual activity. ?Eating habits. ?History of falls. ?Memory and ability to understand (cognition). ?Work and work Statistician. ?Screening  ?You may have the following tests or measurements: ?Height, weight, and BMI. ?Blood pressure. ?Lipid and cholesterol levels. These may be checked every 5 years, or more frequently if you are over 76 years old. ?Skin check. ?Lung cancer screening. You may have this screening every year starting at age 76 if you have a 30-pack-year history of smoking and currently smoke or have quit within the past 15 years. ?Fecal occult blood test (FOBT) of the stool. You may have this test every year starting at age 76 ?Flexible sigmoidoscopy or colonoscopy. You may have a sigmoidoscopy every 5 years or a colonoscopy every 10 years starting at age 76 ?Prostate cancer screening. Recommendations will vary depending on your family history and other risks. ?Hepatitis C blood test. ?Hepatitis B blood test. ?Sexually transmitted disease (STD) testing. ?Diabetes screening. This is done by checking your blood sugar (glucose) after you have not eaten for a while (fasting). You may have this done every 76-3 years. ?Abdominal aortic aneurysm (AAA) screening. You may need this if you are a current or former smoker. ?Osteoporosis. You may be screened starting at age 76 if you are at high risk. ?Talk with your health care provider about your test results, treatment options, and if necessary, the need for more tests. ?Vaccines  ?Your health care provider may recommend certain vaccines, such as: ?Influenza vaccine. This is recommended every year. ?Tetanus, diphtheria, and acellular  pertussis (Tdap, Td) vaccine. You may need  a Td booster every 10 years. ?Zoster vaccine. You may need this after age 76. ?Pneumococcal 13-valent conjugate (PCV13) vaccine. One dose is recommended after age 76. ?Pneumococcal polysaccharide (PPSV23) vaccine. One dose is recommended after age 76. ?Talk to your health care provider about which screenings and vaccines you need and how often you need them. ?This information is not intended to replace advice given to you by your health care provider. Make sure you discuss any questions you have with your health care provider. ?Document Released: 10/27/2015 Document Revised: 06/19/2016 Document Reviewed: 08/01/2015 ?Elsevier Interactive Patient Education ? 2017 [place]. ? ?Fall Prevention in the Home ?Falls can cause injuries. They can happen to people of all ages. There are many things you can do to make your home safe and to help prevent falls. ?What can I do on the outside of my home? ?Regularly fix the edges of walkways and driveways and fix any cracks. ?Remove anything that might make you trip as you walk through a door, such as a raised step or threshold. ?Trim any bushes or trees on the path to your home. ?Use bright outdoor lighting. ?Clear any walking paths of anything that might make someone trip, such as rocks or tools. ?Regularly check to see if handrails are loose or broken. Make sure that both sides of any steps have handrails. ?Any raised decks and porches should have guardrails on the edges. ?Have any leaves, snow, or ice cleared regularly. ?Use sand or salt on walking paths during winter. ?Clean up any spills in your garage right away. This includes oil or grease spills. ?What can I do in the bathroom? ?Use night lights. ?Install grab bars by the toilet and in the tub and shower. Do not use towel bars as grab bars. ?Use non-skid mats or decals in the tub or shower. ?If you need to sit down in the shower, use a plastic, non-slip stool. ?Keep the floor dry. Clean up any water that spills on the  floor as soon as it happens. ?Remove soap buildup in the tub or shower regularly. ?Attach bath mats securely with double-sided non-slip rug tape. ?Do not have throw rugs and other things on the floor that can make you trip. ?What can I do in the bedroom? ?Use night lights. ?Make sure that you have a light by your bed that is easy to reach. ?Do not use any sheets or blankets that are too big for your bed. They should not hang down onto the floor. ?Have a firm chair that has side arms. You can use this for support while you get dressed. ?Do not have throw rugs and other things on the floor that can make you trip. ?What can I do in the kitchen? ?Clean up any spills right away. ?Avoid walking on wet floors. ?Keep items that you use a lot in easy-to-reach places. ?If you need to reach something above you, use a strong step stool that has a grab bar. ?Keep electrical cords out of the way. ?Do not use floor polish or wax that makes floors slippery. If you must use wax, use non-skid floor wax. ?Do not have throw rugs and other things on the floor that can make you trip. ?What can I do with my stairs? ?Do not leave any items on the stairs. ?Make sure that there are handrails on both sides of the stairs and use them. Fix handrails that are broken or loose. Make sure that handrails  are as long as the stairways. ?Check any carpeting to make sure that it is firmly attached to the stairs. Fix any carpet that is loose or worn. ?Avoid having throw rugs at the top or bottom of the stairs. If you do have throw rugs, attach them to the floor with carpet tape. ?Make sure that you have a light switch at the top of the stairs and the bottom of the stairs. If you do not have them, ask someone to add them for you. ?What else can I do to help prevent falls? ?Wear shoes that: ?Do not have high heels. ?Have rubber bottoms. ?Are comfortable and fit you well. ?Are closed at the toe. Do not wear sandals. ?If you use a stepladder: ?Make sure that  it is fully opened. Do not climb a closed stepladder. ?Make sure that both sides of the stepladder are locked into place. ?Ask someone to hold it for you, if possible. ?Clearly mark and make sure that

## 2022-02-27 NOTE — Progress Notes (Signed)
?I connected with Allen Myers today by telephone and verified that I am speaking with the correct person using two identifiers. ?Location patient: home ?Location provider: work ?Persons participating in the virtual visit: patient, provider. ?  ?I discussed the limitations, risks, security and privacy concerns of performing an evaluation and management service by telephone and the availability of in person appointments. I also discussed with the patient that there may be a patient responsible charge related to this service. The patient expressed understanding and verbally consented to this telephonic visit.  ?  ?Interactive audio and video telecommunications were attempted between this provider and patient, however failed, due to patient having technical difficulties OR patient did not have access to video capability.  We continued and completed visit with audio only. ? ?Some vital signs may be absent or patient reported.  ? ?Time Spent with patient on telephone encounter: 30 minutes ? ?Subjective:  ? Allen Myers is a 76 y.o. male who presents for Medicare Annual/Subsequent preventive examination. ? ?Review of Systems    ? ?Cardiac Risk Factors include: advanced age (>79mn, >>74women);diabetes mellitus;dyslipidemia;hypertension;male gender;obesity (BMI >30kg/m2);family history of premature cardiovascular disease ? ?   ?Objective:  ?  ?There were no vitals filed for this visit. ?There is no height or weight on file to calculate BMI. ? ? ?  02/27/2022  ?  2:33 PM 01/08/2021  ?  7:23 AM 07/02/2019  ? 12:58 PM 06/04/2019  ?  6:27 PM 09/29/2018  ?  9:54 AM 01/14/2018  ? 10:08 AM 07/10/2017  ? 12:13 PM  ?Advanced Directives  ?Does Patient Have a Medical Advance Directive? Yes Yes Yes No Yes Yes Yes  ?Type of Advance Directive Living will;Healthcare Power of [place]   [place] will [place] Valley[place] will [place]  will  ?Does patient want to make changes to medical advance directive? No - Patient declined  No - Patient declined  No - Patient declined    ?Copy of [place] Chart? No - copy requested    Yes - validated most recent copy scanned in chart (See row information) No - copy requested No - copy requested  ? ? ?Current Medications (verified) ?Outpatient Encounter Medications as of 02/27/2022  ?Medication Sig  ? Ascorbic Acid (VITAMIN C PO) Take 1 tablet by mouth.  ? atorvastatin (LIPITOR) 80 MG tablet Take 1 tablet (80 mg total) by mouth daily.  ? carvedilol (COREG) 3.125 MG tablet Take 1 tablet (3.125 mg total) by mouth 2 (two) times daily with a meal.  ? cilostazol (PLETAL) 100 MG tablet Take 1 tablet (100 mg total) by mouth 2 (two) times daily.  ? levETIRAcetam (KEPPRA) 1000 MG tablet Take 1 tablet (1,000 mg total) by mouth 2 (two) times daily.  ? levothyroxine (SYNTHROID) 137 MCG tablet Take 1 tablet (137 mcg total) by mouth daily before breakfast.  ? lisinopril (ZESTRIL) 2.5 MG tablet Take 1 tablet (2.5 mg total) by mouth daily.  ? meloxicam (MOBIC) 15 MG tablet Take 15 mg by mouth daily.  ? metFORMIN (GLUCOPHAGE) 1000 MG tablet Take 1 tablet (1,000 mg total) by mouth 2 (two) times daily with a meal.  ? omeprazole (PRILOSEC) 40 MG capsule Take 1 capsule (40 mg total) by mouth daily.  ? pioglitazone (ACTOS) 45 MG tablet Take 1 tablet (45 mg total) by mouth daily.  ? Semaglutide,0.25 or 0.'5MG'$ /DOS, (OZEMPIC, 0.25 OR 0.5 MG/DOSE,) 2 MG/1.5ML SOPN Inject  0.5 mg into the skin once a week.  ? TRADJENTA 5 MG TABS tablet TAKE 1 TABLET DAILY  ? valACYclovir (VALTREX) 1000 MG tablet Take 1 tablet (1,000 mg total) by mouth 2 (two) times daily as needed.  ? vitamin B-12 (CYANOCOBALAMIN) 1000 MCG tablet Take 1 tablet (1,000 mcg total) by mouth daily.  ? ?No facility-administered encounter medications on file as of 02/27/2022.  ? ? ?Allergies (verified) ?Patient has no known allergies.  ? ?History: ?Past  Medical History:  ?Diagnosis Date  ? Allergy   ? Arthritis   ? CAD (coronary artery disease)   ? Erectile dysfunction   ? History of colon polyps   ? History of diverticulitis   ? HYPERTENSION   ? Hypothyroidism   ? Kidney stones   ? "passed it"  ? Melanoma ([place]) 12/10/2019  ? Peripheral arterial disease ([place])   ? SAH (subarachnoid hemorrhage) ([place])   ? Following syncopal episode  ? SDH (subdural hematoma) (H[facility])   ? Following syncopal episode  ? Seizures ([place])   ? 08/2014 - controlled  ? Sleep apnea   ? "mild"  does not use CPAP  ? Sleep apnea in adult   ? Type II diabetes mellitus ([place])   ? type 2  ? ?Past Surgical History:  ?Procedure Laterality Date  ? COLONOSCOPY  2014  ? polyp  ? CORONARY ANGIOPLASTY WITH STENT PLACEMENT  05/11/2014  ? "1"  ? CRANIOTOMY Left 09/12/2014  ? Procedure: CRANIOTOMY HEMATOMA EVACUATION SUBDURAL;  Surgeon: Erline Levine, MD;  Location: [facility];  Service: Neurosurgery;  Laterality: Left;  ? EXTRACORPOREAL SHOCK WAVE LITHOTRIPSY Left 01/08/2021  ? Procedure: EXTRACORPOREAL SHOCK WAVE LITHOTRIPSY (ESWL);  Surgeon: Raynelle Bring, MD;  [facility];  Service: Urology;  Laterality: Left;  ? FRACTIONAL FLOW RESERVE WIRE  05/11/2014  ? Procedure: FRACTIONAL FLOW RESERVE WIRE;  Surgeon: [name], MD;  Location: [facility] CATH LAB;  Service: Cardiovascular;;  ? LEFT HEART CATHETERIZATION WITH CORONARY ANGIOGRAM N/A 05/11/2014  ? Procedure: LEFT HEART CATHETERIZATION WITH CORONARY ANGIOGRAM;  Surgeon: [name], MD;  Location: [facility] CATH LAB;  Service: Cardiovascular;  Laterality: N/A;  ? POLYPECTOMY    ? ?Family History  ?Problem Relation Age of Onset  ? Lung cancer Father   ?     dad was a smoker  ? Heart disease Father   ? AAA (abdominal aortic aneurysm) Father   ? Heart disease Mother   ? Ataxia Maternal Grandmother   ? Dementia Maternal Grandmother   ? Aplastic anemia Sister   ? Prostate cancer Brother   ? Lung cancer Cousin   ? Colon cancer Neg Hx   ? Rectal  cancer Neg Hx   ? Stomach cancer Neg Hx   ? Colon polyps Neg Hx   ? Esophageal cancer Neg Hx   ? ?Social History  ? ?Socioeconomic History  ? Marital status: Married  ?  Spouse name: Beverlee Nims  ? Number of children: 3  ? Years of education: 74  ? Highest education level: Not on file  ?Occupational History  ? 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  Employer: [employer]  ? Occupation: Civil Service fast streamer based in [place]  ? 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  Employer: [employer]  ?Tobacco Use  ? Smoking status: Former  ?  Packs/day: 1.00  ?  Years: 30.00  ?  Pack years: 30.00  ?  Types: Cigarettes  ?  Quit date: 10/15/1983  ?  Years since quitting: 38.3  ? Smokeless tobacco: Never  ?Vaping Use  ? Vaping Use: Never used  ?Substance and Sexual Activity  ? Alcohol use: No  ?  Alcohol/week: 0.0 standard drinks  ? Drug use: No  ? Sexual activity: Yes  ?Other Topics Concern  ? Not on file  ?Social History Narrative  ? Patient is married with 3 daughters 68, 16, 72 and 2 grandchildren.  ? Patient is right handed.  ? Patient has hs education.  ? Patient drinks decaf, and soda occasionally.  ? ?Social Determinants of Health  ? ?Financial Resource Strain: Low Risk   ? Difficulty of Paying Living Expenses: Not hard at all  ?Food Insecurity: No Food Insecurity  ? Worried About Charity fundraiser in the Last Year: Never true  ? Ran Out of Food in the Last Year: Never true  ?Transportation Needs: No Transportation Needs  ? Lack of Transportation (Medical): No  ? Lack of Transportation (Non-Medical): No  ?Physical Activity: Sufficiently Active  ? Days of Exercise per Week: 5 days  ? Minutes of Exercise per Session: 30 min  ?Stress: No Stress Concern Present  ? Feeling of Stress : Not at all  ?Social Connections: Socially Integrated  ? Frequency of Communication with Friends and Family: More than three times a week  ? Frequency of Social Gatherings with Friends and Family: More than three times a week  ? Attends Religious  Services: More than 4 times per year  ? Active Member of Clubs or Organizations: Yes  ? Attends Archivist Meetings: More than 4 times per year  ? Marital Status: Married  ? ? ?Tobacco Counseling ?C

## 2022-03-19 ENCOUNTER — Encounter: Payer: Self-pay | Admitting: Adult Health

## 2022-03-19 ENCOUNTER — Ambulatory Visit (INDEPENDENT_AMBULATORY_CARE_PROVIDER_SITE_OTHER): Payer: Medicare Other | Admitting: Adult Health

## 2022-03-19 VITALS — BP 119/66 | HR 69 | Ht 73.0 in | Wt 253.0 lb

## 2022-03-19 DIAGNOSIS — R569 Unspecified convulsions: Secondary | ICD-10-CM

## 2022-03-19 DIAGNOSIS — Z8679 Personal history of other diseases of the circulatory system: Secondary | ICD-10-CM | POA: Diagnosis not present

## 2022-03-19 MED ORDER — LEVETIRACETAM 1000 MG PO TABS: 1000.0000 mg | ORAL_TABLET | Freq: Two times a day (BID) | ORAL | 3 refills | Status: DC

## 2022-03-19 NOTE — Progress Notes (Signed)
[facility]  PATIENT: Allen Myers DOB: 27-Nov-1945   REASON FOR VISIT: SDH 03/2014 and seizure disorder HISTORY FROM: patient   Chief complaint: Chief Complaint  Patient presents with   Follow-up    RM 3 here for yearly f/u. Pt reports he has been doing well. Denies any seizure activity       HPI  Allen Myers is a 76 y.o. male who continues to be followed in this office for history of traumatic subdural hematoma 03/2014 with resultant seizure disorder.     Returns for yearly seizure follow-up.  Previously seen 02/20/2021 and doing well at that time.  He continues to do well and denies any seizure activity over the past year.  Reports compliance on Keppra 1000 mg twice daily, denies significant side effects.  Does mention some daytime fatigue, does nap daily.  He is routinely followed by PCP Dr. Jenny Reichmann. He does mention some difficulty with memory such as remembering a certain actor/actress's name that started a movie or a random place - he has not difficulty with familiar/family names or familiar locations.  He otherwise has no other short-term memory difficulties.  He continues to work part-time and maintains ADLs and IADLs independently.  No further concerns at this time      REVIEW OF SYSTEMS: Full 14 system review of systems performed and notable only for those  listed, all others are neg:  No complaints   ALLERGIES: No Known Allergies  HOME MEDICATIONS: Outpatient Medications Prior to Visit  Medication Sig Dispense Refill   Ascorbic Acid (VITAMIN C PO) Take 1 tablet by mouth.     atorvastatin (LIPITOR) 80 MG tablet Take 1 tablet (80 mg total) by mouth daily. 90 tablet 3   carvedilol (COREG) 3.125 MG tablet Take 1 tablet (3.125 mg total) by mouth 2 (two) times daily with a meal. 180 tablet 3   cilostazol (PLETAL) 100 MG tablet Take 1 tablet (100 mg total) by mouth 2 (two) times daily. 180 tablet 3   levETIRAcetam (KEPPRA) 1000 MG tablet Take 1  tablet (1,000 mg total) by mouth 2 (two) times daily. 180 tablet 3   levothyroxine (SYNTHROID) 137 MCG tablet Take 1 tablet (137 mcg total) by mouth daily before breakfast. 90 tablet 3   lisinopril (ZESTRIL) 2.5 MG tablet Take 1 tablet (2.5 mg total) by mouth daily. 90 tablet 3   meloxicam (MOBIC) 15 MG tablet Take 15 mg by mouth daily.     metFORMIN (GLUCOPHAGE) 1000 MG tablet Take 1 tablet (1,000 mg total) by mouth 2 (two) times daily with a meal. 180 tablet 3   omeprazole (PRILOSEC) 40 MG capsule Take 1 capsule (40 mg total) by mouth daily. 90 capsule 3   pioglitazone (ACTOS) 45 MG tablet Take 1 tablet (45 mg total) by mouth daily. 90 tablet 3   Semaglutide,0.25 or 0.'5MG'$ /DOS, (OZEMPIC, 0.25 OR 0.5 MG/DOSE,) 2 MG/1.5ML SOPN Inject 0.5 mg into the skin once a week. 6 mL 3   TRADJENTA 5 MG TABS tablet TAKE 1 TABLET DAILY 90 tablet 1   valACYclovir (VALTREX) 1000 MG tablet Take 1 tablet (1,000 mg total) by mouth 2 (two) times daily as needed. 20 tablet 5   vitamin B-12 (CYANOCOBALAMIN) 1000 MCG tablet Take 1 tablet (1,000 mcg total) by mouth daily. 90 tablet 1   No facility-administered medications prior to visit.    PAST MEDICAL HISTORY: Past Medical History:  Diagnosis Date   Allergy    Arthritis  CAD (coronary artery disease)    Erectile dysfunction    History of colon polyps    History of diverticulitis    HYPERTENSION    Hypothyroidism    Kidney stones    "passed it"   Melanoma ([place]) 12/10/2019   Peripheral arterial disease ([place])    SAH (subarachnoid hemorrhage) ([place])    Following syncopal episode   SDH (subdural hematoma) ([place])    Following syncopal episode   Seizures ([place])    08/2014 - controlled   Sleep apnea    "mild"  does not use CPAP   Sleep apnea in adult    Type II diabetes mellitus ([place])    type 2    PAST SURGICAL HISTORY: Past Surgical History:  Procedure Laterality Date   COLONOSCOPY  2014   polyp   CORONARY ANGIOPLASTY WITH STENT PLACEMENT  05/11/2014    "1"   CRANIOTOMY Left 09/12/2014   Procedure: CRANIOTOMY HEMATOMA EVACUATION SUBDURAL;  Surgeon: Erline Levine, MD;  Location: [facility];  Service: Neurosurgery;  Laterality: Left;   EXTRACORPOREAL SHOCK WAVE LITHOTRIPSY Left 01/08/2021   Procedure: EXTRACORPOREAL SHOCK WAVE LITHOTRIPSY (ESWL);  Surgeon: Raynelle Bring, MD;  Location[facility];  Service: Urology;  Laterality: Left;   FRACTIONAL FLOW RESERVE WIRE  05/11/2014   Procedure: FRACTIONAL FLOW RESERVE WIRE;  Surgeon: [name], MD;  Location: [facility] CATH LAB;  Service: Cardiovascular;;   LEFT HEART CATHETERIZATION WITH CORONARY ANGIOGRAM N/A 05/11/2014   Procedure: LEFT HEART CATHETERIZATION WITH CORONARY ANGIOGRAM;  Surgeon: [name], MD;  Location: [facility] CATH LAB;  Service: Cardiovascular;  Laterality: N/A;   POLYPECTOMY      FAMILY HISTORY: Family History  Problem Relation Age of Onset   Lung cancer Father        dad was a smoker   Heart disease Father    AAA (abdominal aortic aneurysm) Father    Heart disease Mother    Ataxia Maternal Grandmother    Dementia Maternal Grandmother    Aplastic anemia Sister    Prostate cancer Brother    Lung cancer Cousin    Colon cancer Neg Hx    Rectal cancer Neg Hx    Stomach cancer Neg Hx    Colon polyps Neg Hx    Esophageal cancer Neg Hx     SOCIAL HISTORY: Social History   Socioeconomic History   Marital status: Married    Spouse name: Beverlee Nims   Number of children: 3   Years of education: 12   Highest education level: Not on file  Occupational History   Occupation: [phone] (CELL)    Employer: [employer]   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   Patient is right handed.   Patient has hs education.  Patient drinks decaf, and soda occasionally.   Social Determinants of Health   Financial Resource Strain: Low Risk    Difficulty of Paying Living Expenses: Not hard at all  Food Insecurity: No Food Insecurity   Worried About Charity fundraiser in the Last Year: Never true   [place] in the Last Year: Never true  Transportation Needs: No Transportation Needs   Lack of Transportation (Medical): No   Lack of Transportation (Non-Medical): No  Physical Activity: Sufficiently Active   Days of Exercise per Week: 5 days   Minutes of Exercise per Session: 30 min  Stress: No Stress Concern Present   Feeling of Stress : Not at all  Social Connections: Socially Integrated   Frequency of Communication with Friends and Family: More than three times a week   Frequency of Social Gatherings with Friends and Family: More than three times a week   Attends Religious Services: More than 4 times per year   Active Member of [employer] or Organizations: Yes   Attends Music therapist: More than 4 times per year   Marital Status: Married  Human resources officer Violence: Not At Risk   Fear of Current or Ex-Partner: No   Emotionally Abused: No   Physically Abused: No   Sexually Abused: No       Vital signs Today's Vitals   03/19/22 1121  BP: 119/66  Pulse: 69  Weight: 253 lb (114.8 kg)  Height: '6\' 1"'$  (1.854 m)    Body mass index is 33.38 kg/m.  Physical exam General: well developed, well nourished,  pleasant elderly Caucasian male, seated, in no evident distress Head: head normocephalic and atraumatic.   Neck: supple with no carotid or  supraclavicular bruits Cardiovascular: regular rate and rhythm, no murmurs Musculoskeletal: no deformity Skin:  no rash/petichiae Vascular:  Normal pulses all extremities   Neurologic Exam Mental Status: Awake and fully alert.   Fluent speech and language.  Oriented to place and time. Recent memory mildly impaired and remote memory intact. Attention span, concentration and fund of knowledge appropriate. Mood and affect appropriate.  Cranial Nerves: Pupils equal, briskly reactive to light. Extraocular movements full without nystagmus. Visual fields full to confrontation. Hearing intact. Facial sensation intact. Face, tongue, palate moves normally and symmetrically.  Motor: Normal bulk and tone. Normal strength in all tested extremity muscles. Sensory.: intact to touch , pinprick , position and vibratory sensation.  Coordination: Rapid alternating movements normal in all extremities. Finger-to-nose and heel-to-shin performed accurately bilaterally. Gait and Station: Arises from chair without difficulty. Stance is normal. Gait demonstrates normal stride length and balance without use of assistive device Reflexes: 1+ and symmetric. Toes downgoing.         ASSESSMENT AND PLAN 76 y.o. Caucasian male with PMH of CAD s/p stent in 04/2014 on ASA , right SDH s/p fall with seizure in 03/2014 put on keppra was admitted again in 08/2014 for left SDH s/p fall. Had slight enlargement of left SDH over days and frequent focal seizure with aphasia and right cheek numbness. Increased keppra dose to '1000mg'$  bid. Had SDH evacuation on 09/12/14. In 09/2014, repeat CT showed evolving left SDH.  He also notes some mild memory difficulties     Plan:   1.  Seizures post traumatic R SDH -Doing well without any seizure activity -last reported seizure 2015 -continue keppra '1000mg'$  bid for seizure prophylaxis -refill provided -As he remains active with driving, he wishes to remain on current dosage -CMP  and CBC  completed by PCP 02/2022 -Discussed importance of avoiding seizure provoking triggers and activities  2.  Mild cognitive impairment -Occasional difficulty remembering famous people's names or unfamiliar locations -likely more age related - no other memory concerns -Continue to monitor    Follow-up in 1 year or call earlier if needed   CC:  Biagio Borg, MD     I spent 23 minutes of face-to-face and non-face-to-face time with patient.  This included previsit chart review, lab review, study review, order entry, electronic health record documentation, patient education and discussion regarding history of seizures posttraumatic right SDH, mild short-term memory loss concerns and answered all other questions to patient's satisfaction   Frann Rider, [facility]  [facility] [address] [place], [place] [address]  Phone [phone] Fax [phone] Note: This document was prepared with digital dictation and possible smart phrase technology. Any transcriptional errors that result from this process are unintentional.

## 2022-03-19 NOTE — Patient Instructions (Addendum)
It was good to see you again today!   No changes today -continue Keppra 1000 mg twice daily -a refill was sent to your mail order pharmacy    Follow-up in 1 year or call earlier if needed

## 2022-03-25 ENCOUNTER — Telehealth: Payer: Self-pay

## 2022-03-25 MED ORDER — LEVETIRACETAM 1000 MG PO TABS: 1000.0000 mg | ORAL_TABLET | Freq: Two times a day (BID) | ORAL | 0 refills | Status: DC

## 2022-03-25 NOTE — Telephone Encounter (Signed)
I called pt back and advised originally we had sent refill on 03/18/2022, but that we could send again since he was having issues with pharmacy confirming refill.  [facility].

## 2022-03-25 NOTE — Telephone Encounter (Signed)
Patient left a voicemail with office this morning requesting a refill on his [place] sent to his mail order pharmacy.  He is requesting a call back.

## 2022-04-17 ENCOUNTER — Telehealth: Payer: Self-pay | Admitting: Internal Medicine

## 2022-04-17 DIAGNOSIS — D1801 Hemangioma of skin and subcutaneous tissue: Secondary | ICD-10-CM | POA: Diagnosis not present

## 2022-04-17 DIAGNOSIS — Z85820 Personal history of malignant melanoma of skin: Secondary | ICD-10-CM | POA: Diagnosis not present

## 2022-04-17 DIAGNOSIS — D692 Other nonthrombocytopenic purpura: Secondary | ICD-10-CM | POA: Diagnosis not present

## 2022-04-17 DIAGNOSIS — L218 Other seborrheic dermatitis: Secondary | ICD-10-CM | POA: Diagnosis not present

## 2022-04-17 DIAGNOSIS — Z85828 Personal history of other malignant neoplasm of skin: Secondary | ICD-10-CM | POA: Diagnosis not present

## 2022-04-17 DIAGNOSIS — L570 Actinic keratosis: Secondary | ICD-10-CM | POA: Diagnosis not present

## 2022-04-17 DIAGNOSIS — D225 Melanocytic nevi of trunk: Secondary | ICD-10-CM | POA: Diagnosis not present

## 2022-04-17 DIAGNOSIS — L821 Other seborrheic keratosis: Secondary | ICD-10-CM | POA: Diagnosis not present

## 2022-04-17 NOTE — Telephone Encounter (Signed)
Type of form received- [place] DMV   Form placed in-Provider mailbox  Additional instructions from the patient- Notify by phone when complete  Pt has also dropped off a copy of a previous packet completed for reference. Call with any questions.     States he is going on an extended vacation and would like to have it back before then.   Informed pt paperwork can take 7-10 business days

## 2022-05-01 NOTE — Telephone Encounter (Signed)
Pt called in requesting a follow up on this paperwork.   States he is "running out of time" and needs someone to call him soon after completion.

## 2022-05-02 ENCOUNTER — Telehealth: Payer: Self-pay

## 2022-05-02 NOTE — Telephone Encounter (Signed)
[place] DMV form filled out, placed on NP's desk to be reviewed, signed when she returns to office next Monday.

## 2022-05-02 NOTE — Telephone Encounter (Signed)
Received Department of Transportation paperwork to be completed. Per patient, he will pay the form fee when he picks up completed paperwork. Paperwork dropped off in POD 3

## 2022-05-03 NOTE — Telephone Encounter (Signed)
Notified patient that paperwork completed and ready for pickup

## 2022-05-06 NOTE — Telephone Encounter (Signed)
Pt would like a call ASAP when form is ready. Pt will come pick up today when ready. Please call [phone]

## 2022-05-06 NOTE — Telephone Encounter (Signed)
[place] DMV paperwork given to [place] to fax to [facility] and call patient to collect payment information.

## 2022-05-06 NOTE — Telephone Encounter (Signed)
Signed and placed in outbox.  Thank you. ?

## 2022-05-06 NOTE — Telephone Encounter (Signed)
Called and advised patient that DMV form was completed and ready for pick up. Also reminded him there is a $50 for fee to be paid at pick up.

## 2022-05-07 DIAGNOSIS — Z0289 Encounter for other administrative examinations: Secondary | ICD-10-CM

## 2022-05-08 ENCOUNTER — Telehealth: Payer: Self-pay

## 2022-05-08 MED ORDER — LINAGLIPTIN 5 MG PO TABS: 5.0000 mg | ORAL_TABLET | Freq: Every day | ORAL | 1 refills | Status: DC

## 2022-05-08 NOTE — Telephone Encounter (Signed)
Pt is requesting refill on: TRADJENTA 5 MG TABS tablet  [facility] [place] [facility]  [place] 02/01/22 ROV 08/07/22  Pt is out of medication

## 2022-06-20 DIAGNOSIS — N200 Calculus of kidney: Secondary | ICD-10-CM | POA: Diagnosis not present

## 2022-06-20 DIAGNOSIS — N401 Enlarged prostate with lower urinary tract symptoms: Secondary | ICD-10-CM | POA: Diagnosis not present

## 2022-06-20 DIAGNOSIS — R351 Nocturia: Secondary | ICD-10-CM | POA: Diagnosis not present

## 2022-07-08 ENCOUNTER — Encounter: Payer: Self-pay | Admitting: Family Medicine

## 2022-07-08 ENCOUNTER — Ambulatory Visit (INDEPENDENT_AMBULATORY_CARE_PROVIDER_SITE_OTHER): Payer: Medicare Other | Admitting: Family Medicine

## 2022-07-08 VITALS — BP 104/66 | HR 81 | Temp 97.4°F | Ht 73.0 in | Wt 256.0 lb

## 2022-07-08 DIAGNOSIS — J069 Acute upper respiratory infection, unspecified: Secondary | ICD-10-CM | POA: Diagnosis not present

## 2022-07-08 DIAGNOSIS — J4520 Mild intermittent asthma, uncomplicated: Secondary | ICD-10-CM | POA: Diagnosis not present

## 2022-07-08 DIAGNOSIS — R059 Cough, unspecified: Secondary | ICD-10-CM

## 2022-07-08 LAB — POC COVID19 BINAXNOW: SARS Coronavirus 2 Ag: NEGATIVE

## 2022-07-08 MED ORDER — PREDNISONE 10 MG PO TABS: 10.0000 mg | ORAL_TABLET | Freq: Every day | ORAL | 0 refills | Status: AC

## 2022-07-08 MED ORDER — DM-GUAIFENESIN ER 30-600 MG PO TB12: 1.0000 | ORAL_TABLET | Freq: Two times a day (BID) | ORAL | 0 refills | Status: AC

## 2022-07-08 NOTE — Progress Notes (Signed)
Established Patient Office Visit  Subjective   Patient ID: Allen Myers, male    DOB: 1946-08-19  Age: 76 y.o. MRN: [id number]  Chief Complaint  Patient presents with   Sinus Problem    Sinus pressure, cough, headaches x 2 days.     Sinus Problem Associated symptoms include congestion and coughing. Pertinent negatives include no chills, headaches or shortness of breath.   evaluation of a 2 to 3-day history of nasal and ear congestion,  postnasal drip and little sore throat.  Denies fevers chills, fatigue malaise, headache or nausea or vomiting.  He does have a history of reactive airway disease with illness such as this.  At this point he is feeling minimal tightness.  He quit smoking over 35 years ago.    Review of Systems  Constitutional: Negative.  Negative for chills, fever and malaise/fatigue.  HENT:  Positive for congestion.   Eyes:  Negative for blurred vision, discharge and redness.  Respiratory:  Positive for cough and wheezing. Negative for sputum production and shortness of breath.   Cardiovascular: Negative.   Gastrointestinal:  Negative for abdominal pain.  Genitourinary: Negative.   Musculoskeletal: Negative.  Negative for joint pain and myalgias.  Skin:  Negative for rash.  Neurological:  Negative for tingling, loss of consciousness, weakness and headaches.  Endo/Heme/Allergies:  Negative for polydipsia.      Objective:     BP 104/66 (BP Location: Right Arm, Patient Position: Sitting, Cuff Size: Large)   Pulse 81   Temp (!) 97.4 F (36.3 C) (Temporal)   Ht '6\' 1"'$  (1.854 m)   Wt 256 lb (116.1 kg)   SpO2 96%   BMI 33.78 kg/m    Physical Exam Constitutional:      General: He is not in acute distress.    Appearance: Normal appearance. He is not ill-appearing, toxic-appearing or diaphoretic.  HENT:     Head: Normocephalic and atraumatic.     Right Ear: External ear normal.     Left Ear: External ear normal.     Mouth/Throat:     Mouth: Mucous  membranes are moist.     Pharynx: Oropharynx is clear. No oropharyngeal exudate or posterior oropharyngeal erythema.  Eyes:     General: No scleral icterus.       Right eye: No discharge.        Left eye: No discharge.     Extraocular Movements: Extraocular movements intact.     Conjunctiva/sclera: Conjunctivae normal.     Pupils: Pupils are equal, round, and reactive to light.  Cardiovascular:     Rate and Rhythm: Normal rate and regular rhythm.  Pulmonary:     Effort: Pulmonary effort is normal. No respiratory distress.     Breath sounds: Normal breath sounds. Decreased air movement present. No decreased breath sounds, wheezing, rhonchi or rales.  Abdominal:     General: Bowel sounds are normal.     Tenderness: There is no abdominal tenderness. There is no guarding.  Musculoskeletal:     Cervical back: No rigidity or tenderness.  Lymphadenopathy:     Cervical: No cervical adenopathy.  Skin:    General: Skin is warm and dry.  Neurological:     Mental Status: He is alert and oriented to person, place, and time.  Psychiatric:        Mood and Affect: Mood normal.        Behavior: Behavior normal.      Results for orders placed or performed in  visit on 07/08/22  POC COVID-19  Result Value Ref Range   SARS Coronavirus 2 Ag Negative Negative      The ASCVD Risk score (Arnett DK, et al., 2019) failed to calculate for the following reasons:   The valid total cholesterol range is 130 to 320 mg/dL    Assessment & Plan:   Problem List Items Addressed This Visit       Respiratory   Upper respiratory infection   Relevant Medications   dextromethorphan-guaiFENesin (MUCINEX DM) 30-600 MG 12hr tablet     Other   Cough - Primary   Relevant Medications   dextromethorphan-guaiFENesin (MUCINEX DM) 30-600 MG 12hr tablet   Other Relevant Orders   POC COVID-19 (Completed)   Other Visit Diagnoses     Mild intermittent reactive airway disease without complication        Relevant Medications   predniSONE (DELTASONE) 10 MG tablet       Return in about 1 week (around 07/15/2022), or if symptoms worsen or fail to improve.    Libby Maw, MD

## 2022-07-16 ENCOUNTER — Encounter: Payer: Self-pay | Admitting: Adult Health

## 2022-08-07 ENCOUNTER — Ambulatory Visit (INDEPENDENT_AMBULATORY_CARE_PROVIDER_SITE_OTHER): Payer: Medicare Other | Admitting: Internal Medicine

## 2022-08-07 VITALS — BP 118/60 | HR 74 | Temp 97.7°F | Ht 73.0 in | Wt 257.0 lb

## 2022-08-07 DIAGNOSIS — E119 Type 2 diabetes mellitus without complications: Secondary | ICD-10-CM

## 2022-08-07 DIAGNOSIS — E559 Vitamin D deficiency, unspecified: Secondary | ICD-10-CM | POA: Diagnosis not present

## 2022-08-07 DIAGNOSIS — I10 Essential (primary) hypertension: Secondary | ICD-10-CM | POA: Diagnosis not present

## 2022-08-07 DIAGNOSIS — M722 Plantar fascial fibromatosis: Secondary | ICD-10-CM | POA: Diagnosis not present

## 2022-08-07 DIAGNOSIS — Z23 Encounter for immunization: Secondary | ICD-10-CM | POA: Diagnosis not present

## 2022-08-07 DIAGNOSIS — E039 Hypothyroidism, unspecified: Secondary | ICD-10-CM

## 2022-08-07 LAB — BASIC METABOLIC PANEL
BUN: 19 mg/dL (ref 6–23)
CO2: 27 mEq/L (ref 19–32)
Calcium: 9.3 mg/dL (ref 8.4–10.5)
Chloride: 102 mEq/L (ref 96–112)
Creatinine, Ser: 0.92 mg/dL (ref 0.40–1.50)
GFR: 80.93 mL/min (ref >60.00)
Glucose, Bld: 140 mg/dL — ABNORMAL HIGH (ref 70–99)
Potassium: 4.7 mEq/L (ref 3.5–5.1)
Sodium: 136 mEq/L (ref 135–145)

## 2022-08-07 LAB — HEPATIC FUNCTION PANEL
ALT: 16 U/L (ref 0–53)
AST: 17 U/L (ref 0–37)
Albumin: 4.2 g/dL (ref 3.5–5.2)
Alkaline Phosphatase: 97 U/L (ref 39–117)
Bilirubin, Direct: 0.1 mg/dL (ref 0.0–0.3)
Total Bilirubin: 0.5 mg/dL (ref 0.2–1.2)
Total Protein: 7.1 g/dL (ref 6.0–8.3)

## 2022-08-07 LAB — LIPID PANEL
Cholesterol: 96 mg/dL (ref 0–200)
HDL: 45.2 mg/dL (ref >39.00)
LDL Cholesterol: 39 mg/dL (ref 0–99)
NonHDL: 50.89
Total CHOL/HDL Ratio: 2
Triglycerides: 58 mg/dL (ref 0.0–149.0)
VLDL: 11.6 mg/dL (ref 0.0–40.0)

## 2022-08-07 LAB — T4, FREE: Free T4: 1.52 ng/dL (ref 0.60–1.60)

## 2022-08-07 LAB — HEMOGLOBIN A1C: Hgb A1c MFr Bld: 7.4 % — ABNORMAL HIGH (ref 4.6–6.5)

## 2022-08-07 LAB — VITAMIN D 25 HYDROXY (VIT D DEFICIENCY, FRACTURES): VITD: 41.84 ng/mL (ref 30.00–100.00)

## 2022-08-07 LAB — TSH: TSH: 0.3 u[IU]/mL — ABNORMAL LOW (ref 0.35–5.50)

## 2022-08-07 MED ORDER — LISINOPRIL 2.5 MG PO TABS: 2.5000 mg | ORAL_TABLET | Freq: Every day | ORAL | 3 refills | Status: DC

## 2022-08-07 MED ORDER — CILOSTAZOL 100 MG PO TABS: 100.0000 mg | ORAL_TABLET | Freq: Two times a day (BID) | ORAL | 3 refills | Status: DC

## 2022-08-07 MED ORDER — METFORMIN HCL 1000 MG PO TABS: 1000.0000 mg | ORAL_TABLET | Freq: Two times a day (BID) | ORAL | 3 refills | Status: DC

## 2022-08-07 MED ORDER — PIOGLITAZONE HCL 45 MG PO TABS: 45.0000 mg | ORAL_TABLET | Freq: Every day | ORAL | 3 refills | Status: DC

## 2022-08-07 MED ORDER — CARVEDILOL 3.125 MG PO TABS: 3.1250 mg | ORAL_TABLET | Freq: Two times a day (BID) | ORAL | 3 refills | Status: DC

## 2022-08-07 MED ORDER — LEVOTHYROXINE SODIUM 137 MCG PO TABS: 137.0000 ug | ORAL_TABLET | Freq: Every day | ORAL | 3 refills | Status: DC

## 2022-08-07 MED ORDER — OMEPRAZOLE 40 MG PO CPDR: 40.0000 mg | DELAYED_RELEASE_CAPSULE | Freq: Every day | ORAL | 3 refills | Status: DC

## 2022-08-07 MED ORDER — ATORVASTATIN CALCIUM 80 MG PO TABS: 80.0000 mg | ORAL_TABLET | Freq: Every day | ORAL | 3 refills | Status: DC

## 2022-08-07 MED ORDER — LINAGLIPTIN 5 MG PO TABS: 5.0000 mg | ORAL_TABLET | Freq: Every day | ORAL | 3 refills | Status: DC

## 2022-08-07 NOTE — Assessment & Plan Note (Signed)
Last vitamin D Lab Results  Component Value Date   VD25OH 36.46 02/12/2022   Low, reminded to start oral replacement

## 2022-08-07 NOTE — Patient Instructions (Addendum)
You had the flu shot today  Ok to start the ozempic that you have  Please have your Shingrix (shingles) shots done at your local pharmacy.  Please see Sports medicine for any worsening of the right plantar fasciitis  Please continue all other medications as before, and refills have been done for all meds to [facility]  Please have the pharmacy call with any other refills you may need.  Please continue your efforts at being more active, low cholesterol diet, and weight control  Please keep your appointments with your specialists as you may have planned  Please go to the LAB at the blood drawing area for the tests to be done  You will be contacted by phone if any changes need to be made immediately.  Otherwise, you will receive a letter about your results with an explanation, but please check with MyChart first.  Please remember to sign up for MyChart if you have not done so, as this will be important to you in the future with finding out test results, communicating by private email, and scheduling acute appointments online when needed.

## 2022-08-07 NOTE — Progress Notes (Signed)
Patient ID: Allen Myers, male   DOB: 09-26-1946, 76 y.o.   MRN: [id number]        Chief Complaint: follow up HTN, HLD and DM, right plantar fasciitis, low vit D       HPI:  Allen Myers is a 76 y.o. male here overall doing ok,  Pt denies chest pain, increased sob or doe, wheezing, orthopnea, PND, increased LE swelling, palpitations, dizziness or syncope.   Pt denies polydipsia, polyuria, or new focal neuro s/s.    Pt denies fever, wt loss, night sweats, loss of appetite, or other constitutional symptoms  Starting back to gym soon.  Due for flu shot, will have shingrix at the pharmacy.  Also has mild intemrittent right plantar heel soreness and tedner, worse with first steps of the morning.   Wt Readings from Last 3 Encounters:  08/09/22 260 lb (117.9 kg)  08/07/22 257 lb (116.6 kg)  07/08/22 256 lb (116.1 kg)   BP Readings from Last 3 Encounters:  08/09/22 100/62  08/07/22 118/60  07/08/22 104/66         Past Medical History:  Diagnosis Date   Allergy    Arthritis    CAD (coronary artery disease)    Erectile dysfunction    History of colon polyps    History of diverticulitis    HYPERTENSION    Hypothyroidism    Kidney stones    "passed it"   Melanoma ([place]) 12/10/2019   Peripheral arterial disease ([place])    SAH (subarachnoid hemorrhage) ([place])    Following syncopal episode   SDH (subdural hematoma) ([place])    Following syncopal episode   Seizures ([place])    08/2014 - controlled   Sleep apnea    "mild"  does not use CPAP   Sleep apnea in adult    Type II diabetes mellitus ([place])    type 2   Past Surgical History:  Procedure Laterality Date   COLONOSCOPY  2014   polyp   CORONARY ANGIOPLASTY WITH STENT PLACEMENT  05/11/2014   "1"   CRANIOTOMY Left 09/12/2014   Procedure: CRANIOTOMY HEMATOMA EVACUATION SUBDURAL;  Surgeon: Erline Levine, MD;  Location: [facility];  Service: Neurosurgery;  Laterality: Left;   EXTRACORPOREAL SHOCK WAVE LITHOTRIPSY Left 01/08/2021   Procedure:  EXTRACORPOREAL SHOCK WAVE LITHOTRIPSY (ESWL);  Surgeon: Raynelle Bring, MD;  Location: [facility];  Service: Urology;  Laterality: Left;   FRACTIONAL FLOW RESERVE WIRE  05/11/2014   Procedure: FRACTIONAL FLOW RESERVE WIRE;  Surgeon: [name], MD;  Location[facility] C[facility] LAB;  Service: Cardiovascular;;   LEFT HEART CATHETERIZATION WITH CORONARY ANGIOGRAM N/A 05/11/2014   Procedure: LEFT HEART CATHETERIZATION WITH CORONARY ANGIOGRAM;  Surgeon: [name], MD;  Location: [facility] CATH LAB;  Service: Cardiovascular;  Laterality: N/A;   POLYPECTOMY      reports that he quit smoking about 38 years ago. His smoking use included cigarettes. He has a 30.00 pack-year smoking history. He has never used smokeless tobacco. He reports that he does not drink alcohol and does not use drugs. family history includes AAA (abdominal aortic aneurysm) in his father; Aplastic anemia in his sister; Ataxia in his maternal grandmother; Dementia in his maternal grandmother; Heart disease in his father and mother; Lung cancer in his cousin and father; Prostate cancer in his brother. No Known Allergies Current Outpatient Medications on File Prior to Visit  Medication Sig Dispense Refill   Ascorbic Acid (VITAMIN C PO) Take 1 tablet  by mouth.     levETIRAcetam (KEPPRA) 1000 MG tablet Take 1 tablet (1,000 mg total) by mouth 2 (two) times daily. 180 tablet 0   meloxicam (MOBIC) 15 MG tablet Take 15 mg by mouth daily.     Semaglutide,0.25 or 0.'5MG'$ /DOS, (OZEMPIC, 0.25 OR 0.5 MG/DOSE,) 2 MG/1.5ML SOPN Inject 0.5 mg into the skin once a week. 6 mL 3   valACYclovir (VALTREX) 1000 MG tablet Take 1 tablet (1,000 mg total) by mouth 2 (two) times daily as needed. 20 tablet 5   vitamin B-12 (CYANOCOBALAMIN) 1000 MCG tablet Take 1 tablet (1,000 mcg total) by mouth daily. 90 tablet 1   No current facility-administered medications on file prior to visit.        ROS:  All others reviewed and negative.  Objective         PE:  BP 118/60 (BP Location: Right Arm, Patient Position: Sitting, Cuff Size: Large)   Pulse 74   Temp 97.7 F (36.5 C) (Other (Comment))   Ht '6\' 1"'$  (1.854 m)   Wt 257 lb (116.6 kg)   SpO2 94%   BMI 33.91 kg/m                 Constitutional: Pt appears in NAD               HENT: Head: NCAT.                Right Ear: External ear normal.                 Left Ear: External ear normal.                Eyes: . Pupils are equal, round, and reactive to light. Conjunctivae and EOM are normal               Nose: without d/c or deformity               Neck: Neck supple. Gross normal ROM               Cardiovascular: Normal rate and regular rhythm.                 Pulmonary/Chest: Effort normal and breath sounds without rales or wheezing.                Abd:  Soft, NT, ND, + BS, no organomegaly;  right plantar heel tender without ulcer               Neurological: Pt is alert. At baseline orientation, motor grossly intact               Skin: Skin is warm. No rashes, no other new lesions, LE edema - none               Psychiatric: Pt behavior is normal without agitation   Micro: none  Cardiac tracings I have personally interpreted today:  none  Pertinent Radiological findings (summarize): none   Lab Results  Component Value Date   WBC 6.8 02/12/2022   HGB 13.8 02/12/2022   HCT 41.2 02/12/2022   PLT 127.0 (L) 02/12/2022   GLUCOSE 140 (H) 08/07/2022   CHOL 96 08/07/2022   TRIG 58.0 08/07/2022   HDL 45.20 08/07/2022   LDLDIRECT 85.7 06/11/2013   LDLCALC 39 08/07/2022   ALT 16 08/07/2022   AST 17 08/07/2022   NA 136 08/07/2022   K 4.7 08/07/2022   CL 102 08/07/2022  CREATININE 0.92 08/07/2022   BUN 19 08/07/2022   CO2 27 08/07/2022   TSH 0.30 (L) 08/07/2022   PSA 2.39 02/12/2022   INR 1.07 09/06/2014   HGBA1C 7.4 (H) 08/07/2022   MICROALBUR 1.0 02/12/2022   Assessment/Plan:  Allen Myers is a 76 y.o. White or Caucasian [1] male with  has a past medical history of Allergy,  Arthritis, CAD (coronary artery disease), Erectile dysfunction, History of colon polyps, History of diverticulitis, HYPERTENSION, Hypothyroidism, Kidney stones, Melanoma ([place]) (12/10/2019), Peripheral arterial disease ([place]), SAH (subarachnoid hemorrhage) ([place]), SDH (subdural hematoma) ([place]), Seizures ([place]), Sleep apnea, Sleep apnea in adult, and Type II diabetes mellitus ([place]).  Vitamin D deficiency Last vitamin D Lab Results  Component Value Date   VD25OH 36.46 02/12/2022   Low, reminded to start oral replacement   Hypothyroidism Lab Results  Component Value Date   TSH 0.30 (L) 08/07/2022   Stable, pt to continue levothyroxine 137 mcg qd   Diabetes mellitus type 2, noninsulin dependent ([place]) Lab Results  Component Value Date   HGBA1C 7.4 (H) 08/07/2022  uncontrolled, has ozemipic to start at home, just keeps forgetting the once weekly shot,  pt to continue current medical treatment and start ozempic 0.25 mg weeily   Essential hypertension BP Readings from Last 3 Encounters:  08/09/22 100/62  08/07/22 118/60  07/08/22 104/66   Stable, pt to continue medical treatment coreg 3.12 mg bid, lisnopril 2.5 mg d,   Plantar fasciitis, right Mild recurring,  For tylenol prn now, declines sport med referral for now  Followup: No follow-ups on file.  Cathlean Cower, MD 08/09/2022 9:00 PM [facility]

## 2022-08-08 NOTE — Progress Notes (Signed)
   Rito Ehrlich, am serving as a Education administrator for Dr. Lynne Leader.  Allen Myers is a 76 y.o. male who presents to [facility] today for right foot pain. Patient was last seen by Dr. Georgina Snell on 01/12/2021 for right sided low back pain. Today patient reports Right foot has been bother him for 2-3 weeks. Saw PCP and was told the pain sounded like plantar fasciitis and was told he could get an injection for that. Patient locates pain to the heel of his right foot that feels like there is a stone in his shoe.    Numbness/tingling: no Aggravates: not using the foot makes the pain worse  Treatments tried: moving     Pertinent review of systems: No fevers or chills  Relevant historical information: Hypertension.  Heart disease.  Diabetes.   Exam:  BP 100/62   Pulse 67   Ht '6\' 1"'$  (1.854 m)   Wt 260 lb (117.9 kg)   SpO2 96%   BMI 34.30 kg/m  General: Well Developed, well nourished, and in no acute distress.   MSK: Right foot normal. Tender palpation plantar calcaneus.  Normal foot and ankle motion.  Mild antalgic gait.     Assessment and Plan: 76 y.o. male with right plantar heel pain thought to be planter fasciitis.  Plan to treat with eccentric exercises cushioning ice massage and night splint.  Check back in about 6 weeks.  Consider steroid injection if not improving.   PDMP not reviewed this encounter. No orders of the defined types were placed in this encounter.  No orders of the defined types were placed in this encounter.    Discussed warning signs or symptoms. Please see discharge instructions. Patient expresses understanding.   The above documentation has been reviewed and is accurate and complete Lynne Leader, M.D.

## 2022-08-09 ENCOUNTER — Ambulatory Visit: Payer: Medicare Other

## 2022-08-09 ENCOUNTER — Ambulatory Visit (INDEPENDENT_AMBULATORY_CARE_PROVIDER_SITE_OTHER): Payer: Medicare Other | Admitting: Family Medicine

## 2022-08-09 ENCOUNTER — Encounter: Payer: Self-pay | Admitting: Internal Medicine

## 2022-08-09 VITALS — BP 100/62 | HR 67 | Ht 73.0 in | Wt 260.0 lb

## 2022-08-09 DIAGNOSIS — M722 Plantar fascial fibromatosis: Secondary | ICD-10-CM | POA: Diagnosis not present

## 2022-08-09 DIAGNOSIS — M79671 Pain in right foot: Secondary | ICD-10-CM | POA: Diagnosis not present

## 2022-08-09 NOTE — Assessment & Plan Note (Signed)
Lab Results  Component Value Date   HGBA1C 7.4 (H) 08/07/2022  uncontrolled, has ozemipic to start at home, just keeps forgetting the once weekly shot,  pt to continue current medical treatment and start ozempic 0.25 mg weeily

## 2022-08-09 NOTE — Assessment & Plan Note (Signed)
Lab Results  Component Value Date   TSH 0.30 (L) 08/07/2022   Stable, pt to continue levothyroxine 137 mcg qd

## 2022-08-09 NOTE — Assessment & Plan Note (Signed)
BP Readings from Last 3 Encounters:  08/09/22 100/62  08/07/22 118/60  07/08/22 104/66   Stable, pt to continue medical treatment coreg 3.12 mg bid, lisnopril 2.5 mg d,

## 2022-08-09 NOTE — Patient Instructions (Signed)
Thank you for coming in today.   Night splint plantar fasciitis.   Consider [facility] supply.  Heel exercises.  Go from up to down slowly. Try 10-30 reps 2-3x daly  Use heel cushions.   I can do a shot as needed. We should avoid a shot if you can.   Recheck in 6 weeks.

## 2022-08-09 NOTE — Assessment & Plan Note (Signed)
Mild recurring,  For tylenol prn now, declines sport med referral for now

## 2022-09-20 ENCOUNTER — Ambulatory Visit: Payer: Medicare Other | Admitting: Family Medicine

## 2022-10-02 ENCOUNTER — Other Ambulatory Visit: Payer: Self-pay | Admitting: Internal Medicine

## 2022-10-02 NOTE — Telephone Encounter (Signed)
Please refill as per office routine med refill policy (all routine meds to be refilled for 3 mo or monthly (per pt preference) up to one year from last visit, then month to month grace period for 3 mo, then further med refills will have to be denied) ? ?

## 2022-10-10 ENCOUNTER — Telehealth: Payer: Self-pay | Admitting: Internal Medicine

## 2022-10-10 NOTE — Telephone Encounter (Signed)
PT visits with a set of forms from the [facility] for Dr.Jalynn to fill out. PT stated that a few of the forms were filled out by Dr.Ether back in January of this year but that one page was not not filled out. PT would like this filled out and notified once finished. Forms have been left in Dr.Michall's mailbox.  CB: [phone]

## 2022-10-15 NOTE — Telephone Encounter (Signed)
Form completion in progress 

## 2022-10-16 NOTE — Telephone Encounter (Signed)
Form completed and placed at front office, patient notified and states he will pick it up in the morning.

## 2022-11-22 ENCOUNTER — Other Ambulatory Visit: Payer: Self-pay | Admitting: Internal Medicine

## 2022-11-22 NOTE — Telephone Encounter (Signed)
Please refill as per office routine med refill policy (all routine meds to be refilled for 3 mo or monthly (per pt preference) up to one year from last visit, then month to month grace period for 3 mo, then further med refills will have to be denied) ? ?

## 2022-12-16 ENCOUNTER — Other Ambulatory Visit: Payer: Self-pay | Admitting: *Deleted

## 2022-12-16 DIAGNOSIS — I7141 Pararenal abdominal aortic aneurysm, without rupture: Secondary | ICD-10-CM

## 2022-12-24 ENCOUNTER — Encounter: Payer: Self-pay | Admitting: Student

## 2022-12-24 ENCOUNTER — Ambulatory Visit: Payer: Medicare Other | Attending: Student | Admitting: Student

## 2022-12-24 VITALS — BP 110/58 | HR 69 | Ht 73.0 in | Wt 263.6 lb

## 2022-12-24 DIAGNOSIS — I25118 Atherosclerotic heart disease of native coronary artery with other forms of angina pectoris: Secondary | ICD-10-CM | POA: Diagnosis not present

## 2022-12-24 DIAGNOSIS — R072 Precordial pain: Secondary | ICD-10-CM | POA: Insufficient documentation

## 2022-12-24 DIAGNOSIS — R0609 Other forms of dyspnea: Secondary | ICD-10-CM | POA: Insufficient documentation

## 2022-12-24 DIAGNOSIS — I10 Essential (primary) hypertension: Secondary | ICD-10-CM | POA: Diagnosis not present

## 2022-12-24 DIAGNOSIS — E785 Hyperlipidemia, unspecified: Secondary | ICD-10-CM | POA: Insufficient documentation

## 2022-12-24 DIAGNOSIS — R29898 Other symptoms and signs involving the musculoskeletal system: Secondary | ICD-10-CM | POA: Diagnosis not present

## 2022-12-24 DIAGNOSIS — I714 Abdominal aortic aneurysm, without rupture, unspecified: Secondary | ICD-10-CM | POA: Diagnosis not present

## 2022-12-24 DIAGNOSIS — I739 Peripheral vascular disease, unspecified: Secondary | ICD-10-CM

## 2022-12-24 NOTE — Progress Notes (Signed)
Cardiology Clinic Note   Date: 12/24/2022 ID: Allen Myers 27-Jul-1946, MRN [id number]  Primary Cardiologist:  Kirk Ruths, MD  Patient Profile    Allen Myers is a 77 y.o. male who presents to the clinic today for evaluation of shortness of breath and fatigue.  Past medical history significant for: CAD. LHC 05/11/2014 (syncope/arrhythmia): Mid LAD 40 to 50%.  Proximal to mid LCx 50 to 70%.  Distal RCA 95%.  PCI with stent distal RCA.  Procedure complicated by transient no flow related to thrombus contained within the region of angioplasty. Echo 12/28/2018: EF 55 to 60%.  Mild LVH.  Grade 1 DD.  Mild thickening of aortic valve without stenosis.  Mild dilatation of aortic root measuring 40 mm. Nuclear stress test 06/17/2019: Normal low risk study.  EF 55%. Hypertension. AAA. AAA ultrasound 01/01/2022: Evidence of abnormal dilatation of the mid and distal abdominal aorta with largest aortic measurement 3.6 cm.  No evidence of abnormal dilatation of the bilateral common and external iliac arteries noted.  The largest aortic diameter remains essentially unchanged compared to prior exam (3.6 cm March 2022). Hyperlipidemia. Lipid panel 08/07/2022: LDL 39, HDL 45, TG 50, total 96. T2DM. Hypothyroidism. OSA. Claudication. Syncope. Subarachnoid hemorrhage/subdural hematoma.   History of Present Illness    Allen Myers was first evaluated by cardiology on 04/02/2014 during hospitalization after syncopal episode on his roof.  Patient presented to the ER on 04/01/2014.  It was reported patient's wife witnessed him stumble around on the roof and fall down hitting his back on a roof.  He did not fall off the roof.  He did have loss of consciousness.  Patient was found to have TBI with subarachnoid hemorrhage and subdural hematoma, L1 transverse process fracture, left rib fractures #9 and #11.  Initial troponin negative EKG showed lengthy QT but otherwise no change.  Troponin began to elevate  and it was felt to be secondary and not the primary event.  Plan for catheterization after recovery from acute neurological event.  Echo showed EF 30 to 35%, mild LVH, diffuse hypokinesis, Grade I DD, mildly dilated aortic root.  Patient underwent PCI with stent to distal RCA in July 2015 (further details above).  Repeat echo October 2015 showed EF 50 to 55%, Grade I DD.  Patient was last seen in the office by Dr. Stanford Breed on 05/24/2021.  Patient was doing well and no medication changes were made.  Today, patient is alone.  He reports he is having several symptoms that are concerning him.  Over the last 5 to 6 months he reports DOE and fatigue that started out as intermittent but seem to be getting worse.  He reports recently he went to the [employer] game and while walking from the parking lot to his seat he had to stop several times to rest and catch his breath.  He also reports central chest discomfort with exertion that does not happen consistently but has been happening more frequently and will resolve with rest.  He also reports feeling heaviness in bilateral lower extremities.  He feels this is different than previous claudication as it does not involve only his calves but his whole leg.  He reports all of the symptoms have been going on for the last 5 to 6 months if not a little longer than that.  He is not as active as he used to be.  He reports after his stent placement he attended cardiac rehab and then continued going until  COVID.  After that he was not very active for a couple of years.  Prior to his symptoms getting worse [place] for about 30 minutes a few days a week.  He has not been able to sustain this secondary to the profound fatigue he feels after exercising.  He denies lower extremity edema, shortness of breath at rest, orthopnea, or PND.   ROS: All other systems reviewed and are otherwise negative except as noted in History of Present  Illness.  Studies Reviewed    ECG personally reviewed by me today: NSR, 69 bpm.  No significant changes from 05/24/2021.  Physical Exam    VS:  BP (!) 110/58   Pulse 69   Ht '6\' 1"'$  (1.854 m)   Wt 263 lb 9.6 oz (119.6 kg)   SpO2 94%   BMI 34.78 kg/m  , BMI Body mass index is 34.78 kg/m.  GEN: Well nourished, well developed, in no acute distress. Neck: No JVD or carotid bruits. Cardiac: RRR. No murmurs. No rubs or gallops.   Respiratory:  Respirations regular and unlabored. Clear to auscultation without rales, wheezing or rhonchi. GI: Soft, nontender, nondistended. Extremities: Radials/DP/PT 2+ and equal bilaterally. No clubbing or cyanosis. No edema.  Skin: Warm and dry, no rash. Neuro: Strength intact.  Assessment & Plan   DOE/fatigue.  Patient reports 5 to 68-monthhistory of progressively worsening DOE.  [facility] seat required him to rest several times to catch his breath.  [place] [facility] [place] [facility] [place] [employer] but feels he cannot do what he used to be able to do as far as walking on the treadmill.  Will get an echo to further evaluate. Exertional chest pain/CAD.  S/p stent to distal RCA July 2015.  Echo March 2020 EF 55 to 60%, Grade I DD. Patient reports a 5 to 664-monthistory of central chest discomfort with exertion that lasts until he rests.  The pain does not happen all of the time but seems to be becoming more frequent.  (See #1).  Patient denies chest pain at rest.  He is not tender to palpation.  Will get a nuclear stress test for further evaluation.  Continue aspirin, atorvastatin, and carvedilol.  Patient is provided with ED precautions. Leg heaviness/claudication.  Patient reports a 5 to 6-41-monthstory of progressively worsening feeling of heaviness in bilateral lower extremities.  He feels this is different than previous claudication which only occurred in bilateral calves and this heaviness  encompasses his entire leg.  He feels this is limiting him from doing activity.  Bilateral lower extremities are warm and dry with no evidence of skin changes.  1+ PT/DP pulses.  Will get arterial ultrasound and ABIs for further evaluation.  Continue Pletal, aspirin, and atorvastatin. AAA.  Ultrasound March 2023 showed stable abdominal aortic aneurysm at 3.6 cm.  Patient denies abdominal pain, back pain, possibly pulsating abdominal mass.  Scheduled for repeat ultrasound 01/01/2023. Hypertension.  BP today 110/58.  Patient denies headaches or dizziness.  Continue carvedilol and lisinopril. Hyperlipidemia.  LDL October 2023 39, at goal.  Continue atorvastatin.  Disposition: Bilateral arterial ultrasound and ABI.  Echo.  Nuclear stress test.  Return after testing or sooner as needed.     Shared Decision Making/Informed Consent The risks [chest pain, shortness of breath, cardiac arrhythmias, dizziness, blood pressure  fluctuations, myocardial infarction, stroke/transient ischemic attack, nausea, vomiting, allergic reaction, radiation exposure, metallic taste sensation and life-threatening complications (estimated to be 1 in 10,000)], benefits (risk stratification, diagnosing coronary artery disease, treatment guidance) and alternatives of a nuclear stress test were discussed in detail with Mr. Flemming and he agrees to proceed.   Signed, Justice Britain. Tate Jerkins, DNP, NP-C

## 2022-12-24 NOTE — Patient Instructions (Signed)
Medication Instructions:  Your physician recommends that you continue on your current medications as directed. Please refer to the Current Medication list given to you today.  *If you need a refill on your cardiac medications before your next appointment, please call your pharmacy*   Lab Work: NONE If you have labs (blood work) drawn today and your tests are completely normal, you will receive your results only by: [place] (if you have MyChart) OR A paper copy in the mail If you have any lab test that is abnormal or we need to change your treatment, we will call you to review the results.   Testing/Procedures: Your physician has requested that you have an echocardiogram. Echocardiography is a painless test that uses sound waves to create images of your heart. It provides your doctor with information about the size and shape of your heart and how well your heart's chambers and valves are working. This procedure takes approximately one hour. There are no restrictions for this procedure. Please do NOT wear cologne, perfume, aftershave, or lotions (deodorant is allowed). Please arrive 15 minutes prior to your appointment time.  Your physician has requested that you have a lexiscan myoview. For further information please visit [email or web address]. Please follow instruction sheet, as given.   Your physician has requested that you have a lower extremity arterial duplex. This test is an ultrasound of the arteries in the legs. It looks at arterial blood flow in the legs. Allow one hour for Lower Arterial scans. There are no restrictions or special instructions     Follow-Up: At [facility], you and your health needs are our priority.  As part of our continuing mission to provide you with exceptional heart care, we have created designated Provider Care Teams.  These Care Teams include your primary Cardiologist (physician) and Advanced Practice Providers (APPs -  Physician Assistants  and Nurse Practitioners) who all work together to provide you with the care you need, when you need it.  We recommend signing up for the patient portal called "MyChart".  Sign up information is provided on this After Visit Summary.  MyChart is used to connect with patients for Virtual Visits (Telemedicine).  Patients are able to view lab/test results, encounter notes, upcoming appointments, etc.  Non-urgent messages can be sent to your provider as well.   To learn more about what you can do with MyChart, go to [email or web address].    Your next appointment:   2 month(s)  Provider:   Kirk Ruths, MD OR Mayra Reel, NP    How to Prepare for Your Myoview Test (stress test):  Please do not take these medications before your test:Carvedilol (please note if this is an exercise test pt should hold beta blocker prior) Your remaining medications may be taken with water. Nothing to eat or drink, except water, 4 hours prior to arrival time.  NO caffeine/decaffeinated products, or chocolate 12 hours prior to arrival. [place], please do not wear dresses.  Skirts or pants are approprate, please wear a short sleeve shirt. NO perfume, cologne or lotion Wear comfortable walking shoes.  NO HEELS! Total time is 3 to 4 hours; you may want to bring reading material for the waiting time. Please report to [facility] for your test  What to expect after you arrive:  Once you arrive and check in for your appointment an IV will be started in your arm.  Then the Technoligist will inject a small amount of radioactive tracer.  There will be a 1  hour waiting period after this injection.  A series of pictures will be taken of your heart following this waiting period.  You will be prepped for the stress portion of the test.  During the stress portion of your test you will either walk on a treadmill or receive a small, safe amount of radioactive tracer injected in your IV.  After the stress portion, there is a  short rest period during which time your heart and blood pressure will be monitored.  After the short rest period the Technologist will begin your second set of pictures.  Your doctor will inform you of your test results within 7-10 business days.  In preparation for your appointment, medication and supplies will be purchased.  Appointment availability is limited, so if you need to cancel or reschedule please call the office at [phone] 24 hours in advance to avoid a cancellation fee of $100.00  IF Ehrenfeld, Advance TECHNOLOGIST.

## 2022-12-25 ENCOUNTER — Other Ambulatory Visit (HOSPITAL_COMMUNITY): Payer: Self-pay | Admitting: Student

## 2022-12-25 DIAGNOSIS — I739 Peripheral vascular disease, unspecified: Secondary | ICD-10-CM

## 2022-12-26 ENCOUNTER — Telehealth (HOSPITAL_COMMUNITY): Payer: Self-pay | Admitting: *Deleted

## 2022-12-26 ENCOUNTER — Telehealth: Payer: Self-pay | Admitting: Cardiology

## 2022-12-26 NOTE — Telephone Encounter (Signed)
Pt c/o medication issue:  1. Name of Medication: carvedilol (COREG) 3.125 MG tablet   2. How are you currently taking this medication (dosage and times per day)? Take 1 tablet (3.125 mg total) by mouth 2 (two) times daily with a meal.   3. Are you having a reaction (difficulty breathing--STAT)? No  4. What is your medication issue? Pt has a Myocardial Perfusion test scheduled for Monday 03/18 and wanted to know if this was the test that he was told to not take his Carvedilol the day before and the day of.  Please advise.

## 2022-12-26 NOTE — Telephone Encounter (Signed)
Patient given detailed instructions per Myocardial Perfusion Study Information Sheet for the test on 12/30/2022 at 8:15. Patient notified to arrive 15 minutes early and that it is imperative to arrive on time for appointment to keep from having the test rescheduled. If you need to cancel or reschedule your appointment, please call the office within 24 hours of your appointment. . Patient verbalized understanding. If you need to cancel or reschedule your appointment, please call the office within 24 hours of your appointment. . Patient verbalized understanding.Allen Myers

## 2022-12-26 NOTE — Telephone Encounter (Signed)
RN reviewed  the protocol for the upcoming Lexiscan ----patient does not needed to hold carvedilol  for Lexiscan.  RN called patient. Patient states he was reading  stated to hold  carvedilol  prior to  [place].  RN informed patient  he was correct but  RN  informed patient it has correction in instruction - he does not have to stop  the Carvedilol for testing because he will not be  actually walking on a treadmill to  increase heart rate. Patient verbalized understanding.

## 2022-12-30 ENCOUNTER — Ambulatory Visit (HOSPITAL_COMMUNITY): Payer: Medicare Other | Attending: Student

## 2022-12-30 DIAGNOSIS — R072 Precordial pain: Secondary | ICD-10-CM

## 2022-12-30 LAB — MYOCARDIAL PERFUSION IMAGING
LV dias vol: 109 mL (ref 62–150)
LV sys vol: 43 mL
Nuc Stress EF: 60 %
Peak HR: 90 {beats}/min
Rest HR: 67 {beats}/min
Rest Nuclear Isotope Dose: 10.8 mCi
SDS: 0
SRS: 0
SSS: 0
ST Depression (mm): 0 mm
Stress Nuclear Isotope Dose: 30.9 mCi
TID: 1.08

## 2022-12-30 MED ORDER — TECHNETIUM TC 99M TETROFOSMIN IV KIT
30.9000 | PACK | Freq: Once | INTRAVENOUS | Status: AC | PRN
  Administered 2022-12-30: 30.9 via INTRAVENOUS

## 2022-12-30 MED ORDER — TECHNETIUM TC 99M TETROFOSMIN IV KIT
10.8000 | PACK | Freq: Once | INTRAVENOUS | Status: AC | PRN
  Administered 2022-12-30: 10.8 via INTRAVENOUS

## 2022-12-30 MED ORDER — REGADENOSON 0.4 MG/5ML IV SOLN
0.4000 mg | Freq: Once | INTRAVENOUS | Status: AC
  Administered 2022-12-30: 0.4 mg via INTRAVENOUS

## 2022-12-31 ENCOUNTER — Ambulatory Visit (HOSPITAL_COMMUNITY): Payer: Medicare Other

## 2023-01-01 ENCOUNTER — Ambulatory Visit (HOSPITAL_COMMUNITY)
Admission: RE | Admit: 2023-01-01 | Discharge: 2023-01-01 | Disposition: A | Discharge status: Home / Self Care | Payer: Medicare Other | Source: Ambulatory Visit | Attending: Cardiology | Admitting: Cardiology

## 2023-01-01 DIAGNOSIS — I7141 Pararenal abdominal aortic aneurysm, without rupture: Secondary | ICD-10-CM | POA: Insufficient documentation

## 2023-01-02 ENCOUNTER — Other Ambulatory Visit: Payer: Self-pay | Admitting: *Deleted

## 2023-01-02 DIAGNOSIS — I7143 Infrarenal abdominal aortic aneurysm, without rupture: Secondary | ICD-10-CM

## 2023-01-06 ENCOUNTER — Ambulatory Visit (HOSPITAL_COMMUNITY)
Admission: RE | Admit: 2023-01-06 | Discharge: 2023-01-06 | Disposition: A | Discharge status: Home / Self Care | Payer: Medicare Other | Source: Ambulatory Visit | Attending: Cardiovascular Disease | Admitting: Cardiovascular Disease

## 2023-01-06 DIAGNOSIS — I739 Peripheral vascular disease, unspecified: Secondary | ICD-10-CM

## 2023-01-06 DIAGNOSIS — R29898 Other symptoms and signs involving the musculoskeletal system: Secondary | ICD-10-CM | POA: Insufficient documentation

## 2023-01-07 LAB — VAS US ABI WITH/WO TBI
Left ABI: 1.09
Right ABI: 0.7

## 2023-01-16 ENCOUNTER — Ambulatory Visit (INDEPENDENT_AMBULATORY_CARE_PROVIDER_SITE_OTHER): Payer: Medicare Other | Admitting: Internal Medicine

## 2023-01-16 VITALS — BP 120/62 | HR 83 | Temp 98.2°F | Ht 73.0 in | Wt 262.0 lb

## 2023-01-16 DIAGNOSIS — E119 Type 2 diabetes mellitus without complications: Secondary | ICD-10-CM | POA: Diagnosis not present

## 2023-01-16 DIAGNOSIS — E039 Hypothyroidism, unspecified: Secondary | ICD-10-CM | POA: Diagnosis not present

## 2023-01-16 DIAGNOSIS — R062 Wheezing: Secondary | ICD-10-CM

## 2023-01-16 DIAGNOSIS — E7800 Pure hypercholesterolemia, unspecified: Secondary | ICD-10-CM

## 2023-01-16 DIAGNOSIS — I10 Essential (primary) hypertension: Secondary | ICD-10-CM

## 2023-01-16 DIAGNOSIS — R051 Acute cough: Secondary | ICD-10-CM | POA: Diagnosis not present

## 2023-01-16 DIAGNOSIS — E559 Vitamin D deficiency, unspecified: Secondary | ICD-10-CM

## 2023-01-16 LAB — POCT RESPIRATORY SYNCYTIAL VIRUS: RSV Rapid Ag: NEGATIVE

## 2023-01-16 LAB — POC INFLUENZA A&B (BINAX/QUICKVUE)
Influenza A, POC: NEGATIVE
Influenza B, POC: NEGATIVE

## 2023-01-16 LAB — POC SOFIA SARS ANTIGEN FIA: SARS Coronavirus 2 Ag: NEGATIVE

## 2023-01-16 MED ORDER — PREDNISONE 10 MG PO TABS: ORAL_TABLET | ORAL | 0 refills | Status: DC

## 2023-01-16 MED ORDER — DOXYCYCLINE HYCLATE 100 MG PO TABS
100.0000 mg | ORAL_TABLET | Freq: Two times a day (BID) | ORAL | 0 refills | Status: DC
Start: 2023-01-16 — End: 2023-11-17

## 2023-01-16 MED ORDER — HYDROCODONE BIT-HOMATROP MBR 5-1.5 MG/5ML PO SOLN: 5.0000 mL | Freq: Four times a day (QID) | ORAL | 0 refills | Status: AC | PRN

## 2023-01-16 NOTE — Progress Notes (Signed)
Patient ID: CARROL POMPLUN, male   DOB: 1946-05-16, 77 y.o.   MRN: [id number]        Chief Complaint: follow up cough, wheezing, dm, htn hld, low thyroid, low vit d       HPI:  Allen Myers is a 77 y.o. male Here with acute onset mild to mod 2-3 days ST, HA, general weakness and malaise, with prod cough greenish sputum, but Pt denies chest pain, increased sob or doe, wheezing, orthopnea, PND, increased LE swelling, palpitations, dizziness or syncope, except for mild wheezing since last pm.   Pt denies polydipsia, polyuria, or new focal neuro s/s.    Pt denies wt loss, night sweats, loss of appetite, or other constitutional symptoms        Wt Readings from Last 3 Encounters:  01/16/23 262 lb (118.8 kg)  12/30/22 263 lb (119.3 kg)  12/24/22 263 lb 9.6 oz (119.6 kg)   BP Readings from Last 3 Encounters:  01/16/23 120/62  12/24/22 (!) 110/58  08/09/22 100/62         Past Medical History:  Diagnosis Date   Allergy    Arthritis    CAD (coronary artery disease)    Erectile dysfunction    History of colon polyps    History of diverticulitis    HYPERTENSION    Hypothyroidism    Kidney stones    "passed it"   Melanoma 12/10/2019   Peripheral arterial disease    SAH (subarachnoid hemorrhage)    Following syncopal episode   SDH (subdural hematoma)    Following syncopal episode   Seizures    08/2014 - controlled   Sleep apnea    "mild"  does not use CPAP   Sleep apnea in adult    Type II diabetes mellitus    type 2   Past Surgical History:  Procedure Laterality Date   COLONOSCOPY  2014   polyp   CORONARY ANGIOPLASTY WITH STENT PLACEMENT  05/11/2014   "1"   CRANIOTOMY Left 09/12/2014   Procedure: CRANIOTOMY HEMATOMA EVACUATION SUBDURAL;  Surgeon: Maeola Harman, MD;  Location: [facility];  Service: Neurosurgery;  Laterality: Left;   EXTRACORPOREAL SHOCK WAVE LITHOTRIPSY Left 01/08/2021   Procedure: EXTRACORPOREAL SHOCK WAVE LITHOTRIPSY (ESWL);  Surgeon: Heloise Purpura, MD;   Location: [facility];  Service: Urology;  Laterality: Left;   FRACTIONAL FLOW RESERVE WIRE  05/11/2014   Procedure: FRACTIONAL FLOW RESERVE WIRE;  Surgeon: Lesleigh Noe, MD;  Location: [facility] CATH LAB;  Service: Cardiovascular;;   LEFT HEART CATHETERIZATION WITH CORONARY ANGIOGRAM N/A 05/11/2014   Procedure: LEFT HEART CATHETERIZATION WITH CORONARY ANGIOGRAM;  Surgeon: Lesleigh Noe, MD;  Location: [facility] CATH LAB;  Service: Cardiovascular;  Laterality: N/A;   POLYPECTOMY      reports that he quit smoking about 39 years ago. His smoking use included cigarettes. He has a 30.00 pack-year smoking history. He has never used smokeless tobacco. He reports that he does not drink alcohol and does not use drugs. family history includes AAA (abdominal aortic aneurysm) in his father; Aplastic anemia in his sister; Ataxia in his maternal grandmother; Dementia in his maternal grandmother; Heart disease in his father and mother; Lung cancer in his cousin and father; Prostate cancer in his brother. No Known Allergies Current Outpatient Medications on File Prior to Visit  Medication Sig Dispense Refill   Ascorbic Acid (VITAMIN C PO) Take 1 tablet by mouth.     aspirin EC 81 MG tablet  Take 81 mg by mouth daily.     atorvastatin (LIPITOR) 80 MG tablet TAKE 1 TABLET DAILY 90 tablet 2   carvedilol (COREG) 3.125 MG tablet Take 1 tablet (3.125 mg total) by mouth 2 (two) times daily with a meal. 180 tablet 3   cilostazol (PLETAL) 100 MG tablet Take 1 tablet (100 mg total) by mouth 2 (two) times daily. 180 tablet 3   levETIRAcetam (KEPPRA) 1000 MG tablet Take 1 tablet (1,000 mg total) by mouth 2 (two) times daily. 180 tablet 0   levothyroxine (SYNTHROID) 137 MCG tablet Take 1 tablet (137 mcg total) by mouth daily before breakfast. 90 tablet 3   linagliptin (TRADJENTA) 5 MG TABS tablet Take 1 tablet (5 mg total) by mouth daily. 90 tablet 3   lisinopril (ZESTRIL) 2.5 MG tablet Take 1 tablet (2.5 mg total)  by mouth daily. 90 tablet 3   meloxicam (MOBIC) 15 MG tablet Take 15 mg by mouth daily.     metFORMIN (GLUCOPHAGE) 1000 MG tablet TAKE 1 TABLET TWICE DAILY  WITH MEALS 180 tablet 1   omeprazole (PRILOSEC) 40 MG capsule Take 1 capsule (40 mg total) by mouth daily. 90 capsule 3   pioglitazone (ACTOS) 45 MG tablet Take 1 tablet (45 mg total) by mouth daily. 90 tablet 3   Semaglutide,0.25 or 0.5MG /DOS, (OZEMPIC, 0.25 OR 0.5 MG/DOSE,) 2 MG/1.5ML SOPN Inject 0.5 mg into the skin once a week. 6 mL 3   valACYclovir (VALTREX) 1000 MG tablet Take 1 tablet (1,000 mg total) by mouth 2 (two) times daily as needed. 20 tablet 5   vitamin B-12 (CYANOCOBALAMIN) 1000 MCG tablet Take 1 tablet (1,000 mcg total) by mouth daily. 90 tablet 1   No current facility-administered medications on file prior to visit.        ROS:  All others reviewed and negative.  Objective        PE:  BP 120/62   Pulse 83   Temp 98.2 F (36.8 C) (Oral)   Ht  (1.854 m)   Wt 262 lb (118.8 kg)   SpO2 92%   BMI 34.57 kg/m                 Constitutional: Pt appears in NAD but mild sob               HENT: Head: NCAT.                Right Ear: External ear normal.                 Left Ear: External ear normal. Bilat tm's with mild erythema.  Max sinus areas mild tender.  Pharynx with mild erythema, no exudate                Eyes: . Pupils are equal, round, and reactive to light. Conjunctivae and EOM are normal               Nose: without d/c or deformity               Neck: Neck supple. Gross normal ROM               Cardiovascular: Normal rate and regular rhythm.                 Pulmonary/Chest: Effort normal and breath sounds without rales but with mild bilat wheezing.  Neurological: Pt is alert. At baseline orientation, motor grossly intact               Skin: Skin is warm. No rashes, no other new lesions, LE edema - none               Psychiatric: Pt behavior is normal without agitation    Micro: none  Cardiac tracings I have personally interpreted today:  none  Pertinent Radiological findings (summarize): none   Lab Results  Component Value Date   WBC 6.8 02/12/2022   HGB 13.8 02/12/2022   HCT 41.2 02/12/2022   PLT 127.0 (L) 02/12/2022   GLUCOSE 140 (H) 08/07/2022   CHOL 96 08/07/2022   TRIG 58.0 08/07/2022   HDL 45.20 08/07/2022   LDLDIRECT 85.7 06/11/2013   LDLCALC 39 08/07/2022   ALT 16 08/07/2022   AST 17 08/07/2022   NA 136 08/07/2022   K 4.7 08/07/2022   CL 102 08/07/2022   CREATININE 0.92 08/07/2022   BUN 19 08/07/2022   CO2 27 08/07/2022   TSH 0.30 (L) 08/07/2022   PSA 2.39 02/12/2022   INR 1.07 09/06/2014   HGBA1C 7.4 (H) 08/07/2022   MICROALBUR 1.0 02/12/2022   POCT - Covid - neg, RSV - neg, Flu - neg  Assessment/Plan:  Leone PayorJohn J Massaro is a 77 y.o. White or Caucasian [1] male with  has a past medical history of Allergy, Arthritis, CAD (coronary artery disease), Erectile dysfunction, History of colon polyps, History of diverticulitis, HYPERTENSION, Hypothyroidism, Kidney stones, Melanoma (12/10/2019), Peripheral arterial disease, SAH (subarachnoid hemorrhage), SDH (subdural hematoma), Seizures, Sleep apnea, Sleep apnea in adult, and Type II diabetes mellitus.  Cough Mild to mod, c/w bornchitis vs pna, decliens cxr, for antibx course, cough med prn,  to f/u any worsening symptoms or concerns  Diabetes mellitus type 2, noninsulin dependent (HCC) Lab Results  Component Value Date   HGBA1C 7.4 (H) 08/07/2022   Uncontrolled mildly, pt to continue current medical treatment tradjenta 5 d, metformiin 1000 bid, actos 45 qd and ozempic 0.5 mg weekly and imrpvoed DM diet, declines other change for noww   Essential hypertension BP Readings from Last 3 Encounters:  01/16/23 120/62  12/24/22 (!) 110/58  08/09/22 100/62   Stable, pt to continue medical treatment coreg 3.125 bid,  lisinopril 2.5 qd   Hypothyroidism Lab Results  Component Value  Date   TSH 0.30 (L) 08/07/2022   Stable, pt to continue levothyroxine 137 mcg dd   Vitamin D deficiency Last vitamin D Lab Results  Component Value Date   VD25OH 41.84 08/07/2022   Stable, cont oral replacement   Wheezing Mild to mod, for prednisone course,  to f/u any worsening symptoms or concerns  Followup: Return in about 6 months (around 07/18/2023).  Oliver BarreJames Graesyn, MD 01/18/2023 8:01 PM [facility]

## 2023-01-16 NOTE — Patient Instructions (Addendum)
Your COVID, RSV and Flu testing was negative  Please take all new medication as prescribed - the antibiotic, cough medicine, and prednisone  Please continue all other medications as before, and refills have been done if requested.  Please have the pharmacy call with any other refills you may need.  Please continue your efforts at being more active, low cholesterol diet, and weight control.  You are otherwise up to date with prevention measures today.  Please keep your appointments with your specialists as you may have planned - cardiology and vascular  Please make an Appointment to return in 6 months, or sooner if needed

## 2023-01-18 ENCOUNTER — Encounter: Payer: Self-pay | Admitting: Internal Medicine

## 2023-01-18 DIAGNOSIS — R062 Wheezing: Secondary | ICD-10-CM | POA: Insufficient documentation

## 2023-01-18 NOTE — Assessment & Plan Note (Signed)
Mild to mod, c/w bornchitis vs pna, decliens cxr, for antibx course, cough med prn,  to f/u any worsening symptoms or concerns 

## 2023-01-18 NOTE — Assessment & Plan Note (Signed)
Lab Results  Component Value Date   HGBA1C 7.4 (H) 08/07/2022   Uncontrolled mildly, pt to continue current medical treatment tradjenta 5 d, metformiin 1000 bid, actos 45 qd and ozempic 0.5 mg weekly and imrpvoed DM diet, declines other change for noww

## 2023-01-18 NOTE — Assessment & Plan Note (Signed)
Mild to mod, for prednisone course,  to f/u any worsening symptoms or concerns 

## 2023-01-18 NOTE — Assessment & Plan Note (Signed)
BP Readings from Last 3 Encounters:  01/16/23 120/62  12/24/22 (!) 110/58  08/09/22 100/62   Stable, pt to continue medical treatment coreg 3.125 bid,  lisinopril 2.5 qd

## 2023-01-18 NOTE — Assessment & Plan Note (Signed)
Last vitamin D Lab Results  Component Value Date   VD25OH 41.84 08/07/2022   Stable, cont oral replacement

## 2023-01-18 NOTE — Assessment & Plan Note (Signed)
Lab Results  Component Value Date   TSH 0.30 (L) 08/07/2022   Stable, pt to continue levothyroxine 137 mcg dd

## 2023-01-22 ENCOUNTER — Ambulatory Visit (HOSPITAL_COMMUNITY): Payer: Medicare Other | Attending: Student

## 2023-01-22 DIAGNOSIS — R0609 Other forms of dyspnea: Secondary | ICD-10-CM

## 2023-01-22 LAB — ECHOCARDIOGRAM COMPLETE
Area-P 1/2: 2.34 cm2
S' Lateral: 2.9 cm

## 2023-01-28 ENCOUNTER — Ambulatory Visit: Payer: Medicare Other | Admitting: Cardiovascular Disease

## 2023-01-28 ENCOUNTER — Encounter: Payer: Self-pay | Admitting: Cardiovascular Disease

## 2023-01-28 ENCOUNTER — Ambulatory Visit: Payer: Medicare Other | Attending: Cardiovascular Disease | Admitting: Cardiovascular Disease

## 2023-01-28 VITALS — BP 84/46 | HR 81 | Ht 73.0 in | Wt 265.2 lb

## 2023-01-28 DIAGNOSIS — I739 Peripheral vascular disease, unspecified: Secondary | ICD-10-CM

## 2023-01-28 NOTE — Progress Notes (Signed)
01/28/2023 Allen Myers   24-Sep-1946  [id number]  Primary Physician Jonny Ruiz Len Blalock, MD Primary Cardiologist: Runell Gess MD Nicholes Calamity, [place]  HPI:  Allen Myers is a 77 y.o. 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  He was referred by Carlos Levering NP for evaluation of PAD.  His primary cardiologist is Dr. Olga Millers.  His risk factors include remote tobacco abuse, treated hypertension, diabetes and hyperlipidemia.  There is no family history of heart disease.  He is never had a heart attack or stroke.  He did have RCA stenting in the past.  He was placed on Pletal prior to COVID which afforded him significant relief with regards to claudication.  He was working out at [employer] regularly but not so much now.  He does complain of some generalized fatigue, dyspnea and some atypical back pain.  He also complains of some lower extremity weakness.  He does have a small abdominal aortic aneurysm measuring 3.6 cm which has been stable.  Lower extremity Doppler studies performed 01/06/2023 revealed a right ABI of 0.70, left of 1.09 with what appears to be an occluded right popliteal artery aneurysm.   Current Meds  Medication Sig   Ascorbic Acid (VITAMIN C PO) Take 1 tablet by mouth.   aspirin EC 81 MG tablet Take 81 mg by mouth daily.   atorvastatin (LIPITOR) 80 MG tablet TAKE 1 TABLET DAILY   carvedilol (COREG) 3.125 MG tablet Take 1 tablet (3.125 mg total) by mouth 2 (two) times daily with a meal.   cilostazol (PLETAL) 100 MG tablet Take 1 tablet (100 mg total) by mouth 2 (two) times daily.   doxycycline (VIBRA-TABS) 100 MG tablet Take 1 tablet (100 mg total) by mouth 2 (two) times daily.   levETIRAcetam (KEPPRA) 1000 MG tablet Take 1 tablet (1,000 mg total) by mouth 2 (two) times daily.   levothyroxine (SYNTHROID) 137 MCG tablet Take 1 tablet (137 mcg total) by  mouth daily before breakfast.   linagliptin (TRADJENTA) 5 MG TABS tablet Take 1 tablet (5 mg total) by mouth daily.   lisinopril (ZESTRIL) 2.5 MG tablet Take 1 tablet (2.5 mg total) by mouth daily.   meloxicam (MOBIC) 15 MG tablet Take 15 mg by mouth daily.   metFORMIN (GLUCOPHAGE) 1000 MG tablet TAKE 1 TABLET TWICE DAILY  WITH MEALS   omeprazole (PRILOSEC) 40 MG capsule Take 1 capsule (40 mg total) by mouth daily.   pioglitazone (ACTOS) 45 MG tablet Take 1 tablet (45 mg total) by mouth daily.   predniSONE (DELTASONE) 10 MG tablet 3 tabs by mouth per day for 3 days,2tabs per day for 3 days,1tab per day for 3 days   Semaglutide,0.25 or 0.5MG /DOS, (OZEMPIC, 0.25 OR 0.5 MG/DOSE,) 2 MG/1.5ML SOPN Inject 0.5 mg into the skin once a week.   valACYclovir (VALTREX) 1000 MG tablet Take 1 tablet (1,000 mg total) by mouth 2 (two) times daily as needed.   vitamin B-12 (CYANOCOBALAMIN) 1000 MCG tablet Take 1 tablet (1,000 mcg total) by mouth daily.     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)    Employer: [employer]   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[place]   Occupation: [phone] (CELL)    Employer: [employer]  Tobacco Use   Smoking status: Former    Packs/day: 1.00    Years: 30.00    Additional pack years: 0.00    Total pack years: 30.00    Types: Cigarettes    Quit date: 10/15/1983    Years since quitting: 39.3   Smokeless tobacco: Never  Vaping Use   Vaping Use: Never used  Substance and Sexual Activity   Alcohol use: No   Drug use: No   Sexual activity: Yes  Other Topics Concern   Not on file  Social History Narrative   Patient is married with 3 daughters 27, 69, 64 and 2 grandchildren.   Patient is right handed.   Patient has hs education.    Patient drinks decaf, and soda occasionally.   Social Determinants of Health   Financial Resource Strain: Low Risk  (02/27/2022)   Overall Financial Resource Strain (CARDIA)    Difficulty of Paying Living Expenses: Not hard at all  Food Insecurity: No Food Insecurity (02/27/2022)   Hunger Vital Sign    Worried About Running Out of Food in the Last Year: Never true    Ran Out of Food in the Last Year: Never true  Transportation Needs: No Transportation Needs (02/27/2022)   PRAPARE - Administrator, Civil Service (Medical): No    Lack of Transportation (Non-Medical): No  Physical Activity: Sufficiently Active (02/27/2022)   Exercise Vital Sign    Days of Exercise per Week: 5 days    Minutes of Exercise per Session: 30 min  Stress: No Stress Concern Present (02/27/2022)   [employer] of Occupational Health - Occupational Stress Questionnaire    Feeling of Stress : Not at all  Social Connections: Socially Integrated (02/27/2022)   Social Connection and Isolation Panel [NHANES]    Frequency of Communication with Friends and Family: More than three times a week    Frequency of Social Gatherings with Friends and Family: More than three times a week    Attends Religious Services: More than 4 times per year    Active Member of [employer] or Organizations: Yes    Attends Engineer, structural: More than 4 times per year    Marital Status: Married  Catering manager Violence: Not At Risk (02/27/2022)   Humiliation, Afraid, Rape, and Kick questionnaire    Fear of Current or Ex-Partner: No    Emotionally Abused: No    Physically Abused: No    Sexually Abused: No     Review of Systems: General: negative for chills, fever, night sweats or weight changes.  Cardiovascular: negative for chest pain, dyspnea on exertion, edema, orthopnea, palpitations, paroxysmal nocturnal dyspnea or shortness of breath Dermatological: negative for rash Respiratory: negative for cough or  wheezing Urologic: negative for hematuria Abdominal: negative for nausea, vomiting, diarrhea, bright red blood per rectum, melena, or hematemesis Neurologic: negative for visual changes, syncope, or dizziness All other systems reviewed and are otherwise negative except as noted above.    Blood pressure (!) 84/46, pulse 81, height  (1.854 m), weight 265 lb 3.2 oz (120.3 kg), SpO2 95 %.  General appearance: alert and no distress Neck: no adenopathy, no carotid bruit, no JVD, supple, symmetrical, trachea midline, and thyroid not enlarged, symmetric, no tenderness/mass/nodules Lungs: clear to auscultation bilaterally Heart: regular rate and rhythm, S1, S2 normal, no murmur, click, rub or gallop  Extremities: extremities normal, atraumatic, no cyanosis or edema Pulses: Decreased right pedal pulse Skin: Skin color, texture, turgor normal. No rashes or lesions Neurologic: Grossly normal  EKG sinus rhythm 81 with left axis deviation.  Personally reviewed this EKG.  ASSESSMENT AND PLAN:   Peripheral arterial disease Mr. Benzel was referred to me by Carlos Levering NP for evaluation of PAD.  He has a history of claudication dating back prior to COVID.  He was placed on Pletal which conferred significant benefit.  He also has a small abdominal aortic aneurysm measuring 3.6 cm.  He had Doppler studies performed 01/06/2023 revealing right ABI of 0.70 and a left of 1.09.  It appears that he has an occluded right popliteal artery aneurysm.  I do not think he is symptomatic enough to have this addressed.  If he gets to be that symptomatic he would require surgical evaluation.     Runell Gess MD FACP,FACC,FAHA, [facility] 01/28/2023 8:33 AM

## 2023-01-28 NOTE — Assessment & Plan Note (Signed)
Allen Myers was referred to me by Carlos Levering NP for evaluation of PAD.  He has a history of claudication dating back prior to COVID.  He was placed on Pletal which conferred significant benefit.  He also has a small abdominal aortic aneurysm measuring 3.6 cm.  He had Doppler studies performed 01/06/2023 revealing right ABI of 0.70 and a left of 1.09.  It appears that he has an occluded right popliteal artery aneurysm.  I do not think he is symptomatic enough to have this addressed.  If he gets to be that symptomatic he would require surgical evaluation.

## 2023-01-28 NOTE — Patient Instructions (Signed)
Medication Instructions:  Your physician recommends that you continue on your current medications as directed. Please refer to the Current Medication list given to you today.  *If you need a refill on your cardiac medications before your next appointment, please call your pharmacy*   Follow-Up: At [facility], you and your health needs are our priority.  As part of our continuing mission to provide you with exceptional heart care, we have created designated Provider Care Teams.  These Care Teams include your primary Cardiologist (physician) and Advanced Practice Providers (APPs -  Physician Assistants and Nurse Practitioners) who all work together to provide you with the care you need, when you need it.  We recommend signing up for the patient portal called "MyChart".  Sign up information is provided on this After Visit Summary.  MyChart is used to connect with patients for Virtual Visits (Telemedicine).  Patients are able to view lab/test results, encounter notes, upcoming appointments, etc.  Non-urgent messages can be sent to your provider as well.   To learn more about what you can do with MyChart, go to [email or web address].    Your next appointment:   We will see you on an as needed basis.  Provider:   Jonathan Berry, MD  
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
## 2023-02-20 ENCOUNTER — Encounter: Payer: Self-pay | Admitting: Cardiology

## 2023-02-27 ENCOUNTER — Ambulatory Visit: Payer: Medicare Other | Attending: Cardiology | Admitting: Cardiology

## 2023-02-27 ENCOUNTER — Encounter: Payer: Self-pay | Admitting: Cardiology

## 2023-02-27 VITALS — BP 148/80 | HR 73 | Ht 73.0 in | Wt 268.0 lb

## 2023-02-27 DIAGNOSIS — I10 Essential (primary) hypertension: Secondary | ICD-10-CM | POA: Diagnosis not present

## 2023-02-27 DIAGNOSIS — I739 Peripheral vascular disease, unspecified: Secondary | ICD-10-CM | POA: Insufficient documentation

## 2023-02-27 DIAGNOSIS — I7143 Infrarenal abdominal aortic aneurysm, without rupture: Secondary | ICD-10-CM | POA: Insufficient documentation

## 2023-02-27 DIAGNOSIS — I25118 Atherosclerotic heart disease of native coronary artery with other forms of angina pectoris: Secondary | ICD-10-CM | POA: Diagnosis not present

## 2023-02-27 NOTE — Patient Instructions (Signed)
    Follow-Up: At [facility], you and your health needs are our priority.  As part of our continuing mission to provide you with exceptional heart care, we have created designated Provider Care Teams.  These Care Teams include your primary Cardiologist (physician) and Advanced Practice Providers (APPs -  Physician Assistants and Nurse Practitioners) who all work together to provide you with the care you need, when you need it.  We recommend signing up for the patient portal called "MyChart".  Sign up information is provided on this After Visit Summary.  MyChart is used to connect with patients for Virtual Visits (Telemedicine).  Patients are able to view lab/test results, encounter notes, upcoming appointments, etc.  Non-urgent messages can be sent to your provider as well.   To learn more about what you can do with MyChart, go to [email or web address].    Your next appointment:   6 month(s)  Provider:   Brian Crenshaw, MD      

## 2023-03-01 ENCOUNTER — Other Ambulatory Visit: Payer: Self-pay | Admitting: Adult Health

## 2023-03-03 ENCOUNTER — Encounter: Payer: Self-pay | Admitting: Internal Medicine

## 2023-03-17 ENCOUNTER — Ambulatory Visit (INDEPENDENT_AMBULATORY_CARE_PROVIDER_SITE_OTHER): Payer: Medicare Other

## 2023-03-17 VITALS — Ht 73.0 in | Wt 268.0 lb

## 2023-03-17 DIAGNOSIS — Z0000 Encounter for general adult medical examination without abnormal findings: Secondary | ICD-10-CM

## 2023-03-17 DIAGNOSIS — Z1211 Encounter for screening for malignant neoplasm of colon: Secondary | ICD-10-CM | POA: Diagnosis not present

## 2023-03-17 NOTE — Patient Instructions (Signed)
Allen Myers , Thank you for taking time to come for your Medicare Wellness Visit. I appreciate your ongoing commitment to your health goals. Please review the following plan we discussed and let me know if I can assist you in the future.   These are the goals we discussed:  Goals      lose weight     Increase the amount water I drink,  watch the amount of carbohydrates, and do portion control     My goal is to lose 20-30 pounds.     Remain active and independent        This is a list of the screening recommended for you and due dates:  Health Maintenance  Topic Date Due   COVID-19 Vaccine (4 - 2023-24 season) 06/14/2022   Colon Cancer Screening  01/15/2023   Complete foot exam   02/02/2023   Hemoglobin A1C  02/06/2023   Yearly kidney health urinalysis for diabetes  02/13/2023   Eye exam for diabetics  04/23/2023   Zoster (Shingles) Vaccine (2 of 2) 05/01/2023   Yearly kidney function blood test for diabetes  08/08/2023   Medicare Annual Wellness Visit  03/16/2024   DTaP/Tdap/Td vaccine (5 - Td or Tdap) 03/05/2033   Pneumonia Vaccine  Completed   Hepatitis C Screening  Completed   HPV Vaccine  Aged Out   Flu Shot  Discontinued    Advanced directives: Information on Advanced Care Planning can be found at [facility] Advance Health Care Directives Advance Health Care Directives ([email or web address]  Please bring a copy of your health care power of attorney and living will to the office to be added to your chart at your convenience.   Conditions/risks identified: Aim for 30 minutes of exercise or brisk walking, 6-8 glasses of water, and 5 servings of fruits and vegetables each day.   Next appointment: Follow up in one year for your annual wellness visit.   Preventive Care 36 Years and Older, Male  Preventive care refers to lifestyle choices and visits with your health care provider that can promote health and wellness. What does preventive care include? A yearly  physical exam. This is also called an annual well check. Dental exams once or twice a year. Routine eye exams. Ask your health care provider how often you should have your eyes checked. Personal lifestyle choices, including: Daily care of your teeth and gums. Regular physical activity. Eating a healthy diet. Avoiding tobacco and drug use. Limiting alcohol use. Practicing safe sex. Taking low doses of aspirin every day. Taking vitamin and mineral supplements as recommended by your health care provider. What happens during an annual well check? The services and screenings done by your health care provider during your annual well check will depend on your age, overall health, lifestyle risk factors, and family history of disease. Counseling  Your health care provider may ask you questions about your: Alcohol use. Tobacco use. Drug use. Emotional well-being. Home and relationship well-being. Sexual activity. Eating habits. History of falls. Memory and ability to understand (cognition). Work and work Astronomer. Screening  You may have the following tests or measurements: Height, weight, and BMI. Blood pressure. Lipid and cholesterol levels. These may be checked every 5 years, or more frequently if you are over 41 years old. Skin check. Lung cancer screening. You may have this screening every year starting at age 93 if you have a 30-pack-year history of smoking and currently smoke or have quit within the past 15  years. Fecal occult blood test (FOBT) of the stool. You may have this test every year starting at age 31. Flexible sigmoidoscopy or colonoscopy. You may have a sigmoidoscopy every 5 years or a colonoscopy every 10 years starting at age 47. Prostate cancer screening. Recommendations will vary depending on your family history and other risks. Hepatitis C blood test. Hepatitis B blood test. Sexually transmitted disease (STD) testing. Diabetes screening. This is done by  checking your blood sugar (glucose) after you have not eaten for a while (fasting). You may have this done every 1-3 years. Abdominal aortic aneurysm (AAA) screening. You may need this if you are a current or former smoker. Osteoporosis. You may be screened starting at age 65 if you are at high risk. Talk with your health care provider about your test results, treatment options, and if necessary, the need for more tests. Vaccines  Your health care provider may recommend certain vaccines, such as: Influenza vaccine. This is recommended every year. Tetanus, diphtheria, and acellular pertussis (Tdap, Td) vaccine. You may need a Td booster every 10 years. Zoster vaccine. You may need this after age 30. Pneumococcal 13-valent conjugate (PCV13) vaccine. One dose is recommended after age 48. Pneumococcal polysaccharide (PPSV23) vaccine. One dose is recommended after age 55. Talk to your health care provider about which screenings and vaccines you need and how often you need them. This information is not intended to replace advice given to you by your health care provider. Make sure you discuss any questions you have with your health care provider. Document Released: 10/27/2015 Document Revised: 06/19/2016 Document Reviewed: 08/01/2015 Elsevier Interactive Patient Education  2017 [employer].  Fall Prevention in the Home Falls can cause injuries. They can happen to people of all ages. There are many things you can do to make your home safe and to help prevent falls. What can I do on the outside of my home? Regularly fix the edges of walkways and driveways and fix any cracks. Remove anything that might make you trip as you walk through a door, such as a raised step or threshold. Trim any bushes or trees on the path to your home. Use bright outdoor lighting. Clear any walking paths of anything that might make someone trip, such as rocks or tools. Regularly check to see if handrails are loose or  broken. Make sure that both sides of any steps have handrails. Any raised decks and porches should have guardrails on the edges. Have any leaves, snow, or ice cleared regularly. Use sand or salt on walking paths during winter. Clean up any spills in your garage right away. This includes oil or grease spills. What can I do in the bathroom? Use night lights. Install grab bars by the toilet and in the tub and shower. Do not use towel bars as grab bars. Use non-skid mats or decals in the tub or shower. If you need to sit down in the shower, use a plastic, non-slip stool. Keep the floor dry. Clean up any water that spills on the floor as soon as it happens. Remove soap buildup in the tub or shower regularly. Attach bath mats securely with double-sided non-slip rug tape. Do not have throw rugs and other things on the floor that can make you trip. What can I do in the bedroom? Use night lights. Make sure that you have a light by your bed that is easy to reach. Do not use any sheets or blankets that are too big for your bed.  They should not hang down onto the floor. Have a firm chair that has side arms. You can use this for support while you get dressed. Do not have throw rugs and other things on the floor that can make you trip. What can I do in the kitchen? Clean up any spills right away. Avoid walking on wet floors. Keep items that you use a lot in easy-to-reach places. If you need to reach something above you, use a strong step stool that has a grab bar. Keep electrical cords out of the way. Do not use floor polish or wax that makes floors slippery. If you must use wax, use non-skid floor wax. Do not have throw rugs and other things on the floor that can make you trip. What can I do with my stairs? Do not leave any items on the stairs. Make sure that there are handrails on both sides of the stairs and use them. Fix handrails that are broken or loose. Make sure that handrails are as long as  the stairways. Check any carpeting to make sure that it is firmly attached to the stairs. Fix any carpet that is loose or worn. Avoid having throw rugs at the top or bottom of the stairs. If you do have throw rugs, attach them to the floor with carpet tape. Make sure that you have a light switch at the top of the stairs and the bottom of the stairs. If you do not have them, ask someone to add them for you. What else can I do to help prevent falls? Wear shoes that: Do not have high heels. Have rubber bottoms. Are comfortable and fit you well. Are closed at the toe. Do not wear sandals. If you use a stepladder: Make sure that it is fully opened. Do not climb a closed stepladder. Make sure that both sides of the stepladder are locked into place. Ask someone to hold it for you, if possible. Clearly mark and make sure that you can see: Any grab bars or handrails. First and last steps. Where the edge of each step is. Use tools that help you move around (mobility aids) if they are needed. These include: Canes. Walkers. Scooters. Crutches. Turn on the lights when you go into a dark area. Replace any light bulbs as soon as they burn out. Set up your furniture so you have a clear path. Avoid moving your furniture around. If any of your floors are uneven, fix them. If there are any pets around you, be aware of where they are. Review your medicines with your doctor. Some medicines can make you feel dizzy. This can increase your chance of falling. Ask your doctor what other things that you can do to help prevent falls. This information is not intended to replace advice given to you by your health care provider. Make sure you discuss any questions you have with your health care provider. Document Released: 07/27/2009 Document Revised: 03/07/2016 Document Reviewed: 11/04/2014 Elsevier Interactive Patient Education  2017 [employer].

## 2023-03-17 NOTE — Progress Notes (Signed)
Subjective:   Allen Myers is a 77 y.o. male who presents for Medicare Annual/Subsequent preventive examination.  I connected with  Allen Myers on 03/17/23 by a audio enabled telemedicine application and verified that I am speaking with the correct person using two identifiers.  Patient Location: Home  Provider Location: Home Office  I discussed the limitations of evaluation and management by telemedicine. The patient expressed understanding and agreed to proceed.  Review of Systems     Cardiac Risk Factors include: advanced age (>53men, >14 women);diabetes mellitus;dyslipidemia;male gender;hypertension     Objective:    Today's Vitals   03/17/23 1343  Weight: 268 lb (121.6 kg)  Height: 6\' 1"  (1.854 m)   Body mass index is 35.36 kg/m.     03/17/2023    1:52 PM 02/27/2022    2:33 PM 01/08/2021    7:23 AM 07/02/2019   12:58 PM 06/04/2019    6:27 PM 09/29/2018    9:54 AM 01/14/2018   10:08 AM  Advanced Directives  Does Patient Have a Medical Advance Directive? No Yes Yes Yes No Yes Yes  Type of Advance Directive  Living will;Healthcare Power of [employer] Power of [place];Living will   Healthcare Power of [place];Living will Healthcare Power of [place];Living will  Does patient want to make changes to medical advance directive? Yes (MAU/Ambulatory/Procedural Areas - Information given) No - Patient declined  No - Patient declined  No - Patient declined   Copy of Healthcare Power of Attorney in Chart?  No - copy requested    Yes - validated most recent copy scanned in chart (See row information) No - copy requested    Current Medications (verified) Outpatient Encounter Medications as of 03/17/2023  Medication Sig   Ascorbic Acid (VITAMIN C PO) Take 1 tablet by mouth.   aspirin EC 81 MG tablet Take 81 mg by mouth daily.   atorvastatin (LIPITOR) 80 MG tablet TAKE 1 TABLET DAILY   carvedilol (COREG) 3.125 MG tablet Take 1 tablet (3.125 mg total) by mouth 2 (two)  times daily with a meal.   cilostazol (PLETAL) 100 MG tablet Take 1 tablet (100 mg total) by mouth 2 (two) times daily.   doxycycline (VIBRA-TABS) 100 MG tablet Take 1 tablet (100 mg total) by mouth 2 (two) times daily.   levETIRAcetam (KEPPRA) 1000 MG tablet TAKE 1 TABLET TWICE A DAY   levothyroxine (SYNTHROID) 137 MCG tablet Take 1 tablet (137 mcg total) by mouth daily before breakfast.   linagliptin (TRADJENTA) 5 MG TABS tablet Take 1 tablet (5 mg total) by mouth daily.   lisinopril (ZESTRIL) 2.5 MG tablet Take 1 tablet (2.5 mg total) by mouth daily.   meloxicam (MOBIC) 15 MG tablet Take 15 mg by mouth daily.   metFORMIN (GLUCOPHAGE) 1000 MG tablet TAKE 1 TABLET TWICE DAILY  WITH MEALS   omeprazole (PRILOSEC) 40 MG capsule Take 1 capsule (40 mg total) by mouth daily.   pioglitazone (ACTOS) 45 MG tablet Take 1 tablet (45 mg total) by mouth daily.   predniSONE (DELTASONE) 10 MG tablet 3 tabs by mouth per day for 3 days,2tabs per day for 3 days,1tab per day for 3 days   Semaglutide,0.25 or 0.5MG /DOS, (OZEMPIC, 0.25 OR 0.5 MG/DOSE,) 2 MG/1.5ML SOPN Inject 0.5 mg into the skin once a week.   valACYclovir (VALTREX) 1000 MG tablet Take 1 tablet (1,000 mg total) by mouth 2 (two) times daily as needed.   vitamin B-12 (CYANOCOBALAMIN) 1000 MCG tablet Take 1 tablet (  1,000 mcg total) by mouth daily.   No facility-administered encounter medications on file as of 03/17/2023.    Allergies (verified) Patient has no known allergies.   History: Past Medical History:  Diagnosis Date   Allergy    Arthritis    CAD (coronary artery disease)    Erectile dysfunction    History of colon polyps    History of diverticulitis    HYPERTENSION    Hypothyroidism    Kidney stones    "passed it"   Melanoma (HCC) 12/10/2019   Peripheral arterial disease (HCC)    SAH (subarachnoid hemorrhage) (HCC)    Following syncopal episode   SDH (subdural hematoma) (HCC)    Following syncopal episode   Seizures (HCC)     08/2014 - controlled   Sleep apnea    "mild"  does not use CPAP   Sleep apnea in adult    Syncope and collapse    Type II diabetes mellitus (HCC)    type 2   Past Surgical History:  Procedure Laterality Date   COLONOSCOPY  10/14/2012   polyp   CORONARY ANGIOPLASTY WITH STENT PLACEMENT  05/11/2014   "1"   CRANIOTOMY Left 09/12/2014   Procedure: CRANIOTOMY HEMATOMA EVACUATION SUBDURAL;  Surgeon: Maeola Harman, MD;  Location: [facility];  Service: Neurosurgery;  Laterality: Left;   EXTRACORPOREAL SHOCK WAVE LITHOTRIPSY Left 01/08/2021   Procedure: EXTRACORPOREAL SHOCK WAVE LITHOTRIPSY (ESWL);  Surgeon: Heloise Purpura, MD;  Location: [facility];  Service: Urology;  Laterality: Left;   FRACTIONAL FLOW RESERVE WIRE  05/11/2014   Procedure: FRACTIONAL FLOW RESERVE WIRE;  Surgeon: Lesleigh Noe, MD;  Location[facility] C[facility] LAB;  Service: Cardiovascular;;   LEFT HEART CATHETERIZATION WITH CORONARY ANGIOGRAM N/A 05/11/2014   Procedure: LEFT HEART CATHETERIZATION WITH CORONARY ANGIOGRAM;  Surgeon: Lesleigh Noe, MD;  Location: [facility] CATH LAB;  Service: Cardiovascular;  Laterality: N/A;   POLYPECTOMY     Family History  Problem Relation Age of Onset   Lung cancer Father        dad was a smoker   Heart disease Father    AAA (abdominal aortic aneurysm) Father    Heart disease Mother    Ataxia Maternal Grandmother    Dementia Maternal Grandmother    Aplastic anemia Sister    Prostate cancer Brother    Lung cancer Cousin    Colon cancer Neg Hx    Rectal cancer Neg Hx    Stomach cancer Neg Hx    Colon polyps Neg Hx    Esophageal cancer Neg Hx    Social History   Socioeconomic History   Marital status: Married    Spouse name: Allen Myers   Number of children: 3   Years of education: 12   Highest education level: Not on file  Occupational History   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)    Employer: [employer]   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[place]    Occupation: [phone] (CELL)    Employer: [employer]  Tobacco Use   Smoking status: Former    Packs/day: 1.00    Years: 30.00    Additional pack years: 0.00    Total pack years: 30.00    Types: Cigarettes    Quit date: 10/15/1983    Years since quitting: 39.4   Smokeless tobacco: Never  Vaping Use   Vaping Use: Never used  Substance and Sexual Activity   Alcohol use: No   Drug use: No   Sexual  activity: Yes  Other Topics Concern   Not on file  Social History Narrative   Patient is married with 3 daughters 39, 28, 15 and 2 grandchildren.   Patient is right handed.   Patient has hs education.   Patient drinks decaf, and soda occasionally.   Social Determinants of Health   Financial Resource Strain: Low Risk  (03/17/2023)   Overall Financial Resource Strain (CARDIA)    Difficulty of Paying Living Expenses: Not hard at all  Food Insecurity: No Food Insecurity (03/17/2023)   Hunger Vital Sign    Worried About Running Out of Food in the Last Year: Never true    Ran Out of Food in the Last Year: Never true  Transportation Needs: No Transportation Needs (03/17/2023)   PRAPARE - Administrator, Civil Service (Medical): No    Lack of Transportation (Non-Medical): No  Physical Activity: Insufficiently Active (03/17/2023)   Exercise Vital Sign    Days of Exercise per Week: 3 days    Minutes of Exercise per Session: 40 min  Stress: No Stress Concern Present (03/17/2023)   [employer] of Occupational Health - Occupational Stress Questionnaire    Feeling of Stress : Not at all  Social Connections: Moderately Integrated (03/17/2023)   Social Connection and Isolation Panel [NHANES]    Frequency of Communication with Friends and Family: Once a week    Frequency of Social Gatherings with Friends and Family: Once a week    Attends Religious Services: More than 4 times per year    Active Member of [employer] or Organizations: Yes    Attends Engineer, structural: More  than 4 times per year    Marital Status: Married    Tobacco Counseling Counseling given: Not Answered   Clinical Intake:  Pre-visit preparation completed: Yes  Pain : No/denies pain  Diabetes: Yes CBG done?: No Did pt. bring in CBG monitor from home?: No  How often do you need to have someone help you when you read instructions, pamphlets, or other written materials from your doctor or pharmacy?: 1 - Never  Diabetic?Yes   Nutrition Risk Assessment:  Has the patient had any N/V/D within the last 2 months?  No  Does the patient have any non-healing wounds?  No  Has the patient had any unintentional weight loss or weight gain?  No   Diabetes:  Is the patient diabetic?  Yes  If diabetic, was a CBG obtained today?  No  Did the patient bring in their glucometer from home?  No  How often do you monitor your CBG's? daily.   Financial Strains and Diabetes Management:  Are you having any financial strains with the device, your supplies or your medication? No .  Does the patient want to be seen by Chronic Care Management for management of their diabetes?  No  Would the patient like to be referred to a Nutritionist or for Diabetic Management?  No   Diabetic Exams:  Diabetic Eye Exam: Completed 04/22/22 Diabetic Foot Exam: Overdue, Pt has been advised about the importance in completing this exam. Pt is scheduled for diabetic foot exam on at next office visit .   Interpreter Needed?: No  Information entered by :: Kandis Fantasia LPN   Activities of Daily Living    03/17/2023    1:52 PM 03/13/2023    9:42 AM  In your present state of health, do you have any difficulty performing the following activities:  Hearing? 0 0  Vision?  0 0  Difficulty concentrating or making decisions? 0 0  Walking or climbing stairs? 0 0  Dressing or bathing? 0 0  Doing errands, shopping? 0 0  Preparing Food and eating ? N N  Using the Toilet? N N  In the past six months, have you accidently  leaked urine? N N  Do you have problems with loss of bowel control? N N  Managing your Medications? N N  Managing your Finances? N N  Housekeeping or managing your Housekeeping? N N    Patient Care Team: Corwin Levins, MD as PCP - General Jens Som Madolyn Frieze, MD as PCP - Cardiology (Cardiology)  Indicate any recent Medical Services you may have received from other than [facility] providers in the past year (date may be approximate).     Assessment:   This is a routine wellness examination for Allen Myers.  Hearing/Vision screen Hearing Screening - Comments:: Denies hearing difficulties   Vision Screening - Comments:: Wears rx glasses - up to date with routine eye exams with [facility]    Dietary issues and exercise activities discussed: Current Exercise Habits: Home exercise routine, Type of exercise: walking, Time (Minutes): 30, Frequency (Times/Week): 3, Weekly Exercise (Minutes/Week): 90, Intensity: Mild   Goals Addressed             This Visit's Progress    Remain active and independent        Depression Screen    03/17/2023    1:51 PM 08/07/2022    9:35 AM 07/08/2022    4:23 PM 02/27/2022    2:38 PM 02/01/2022   10:12 AM 02/01/2022    9:46 AM 01/11/2021    9:49 AM  PHQ 2/9 Scores  PHQ - 2 Score 0 0 0 1 1 0 0  PHQ- 9 Score  0         Fall Risk    03/17/2023    1:50 PM 03/13/2023    9:42 AM 08/07/2022    9:35 AM 07/08/2022    4:23 PM 02/27/2022    2:35 PM  Fall Risk   Falls in the past year? 0 0 0 0 0  Number falls in past yr: 0 0  0 0  Injury with Fall? 0 0   0  Risk for fall due to : No Fall Risks  No Fall Risks  No Fall Risks  Follow up Falls prevention discussed;Education provided;Falls evaluation completed  Falls evaluation completed  Falls evaluation completed    FALL RISK PREVENTION PERTAINING TO THE HOME:  Any stairs in or around the home? No  If so, are there any without handrails? No  Home free of loose throw rugs in walkways, pet beds, electrical  cords, etc? Yes  Adequate lighting in your home to reduce risk of falls? Yes   ASSISTIVE DEVICES UTILIZED TO PREVENT FALLS:  Life alert? No  Use of a cane, walker or w/c? No  Grab bars in the bathroom? Yes  Shower chair or bench in shower? No  Elevated toilet seat or a handicapped toilet? Yes   TIMED UP AND GO:  Was the test performed? No . Telephonic visit   Cognitive Function:        03/17/2023    1:53 PM 02/27/2022    2:46 PM  6CIT Screen  What Year? 0 points 0 points  What month? 0 points 0 points  What time? 0 points 0 points  Count back from 20 0 points 0 points  Months in  reverse 0 points 0 points  Repeat phrase 0 points 0 points  Total Score 0 points 0 points    Immunizations Immunization History  Administered Date(s) Administered   Fluad Quad(high Dose 65+) 06/09/2019, 08/07/2022   Influenza Whole 12/11/2009, 06/19/2011   Influenza, High Dose Seasonal PF 08/07/2015, 07/24/2016, 07/10/2017, 06/16/2018   Influenza,inj,Quad PF,6+ Mos 06/15/2013, 07/26/2014   PFIZER(Purple Top)SARS-COV-2 Vaccination 12/30/2019, 01/24/2020, 07/29/2020   Pneumococcal Conjugate-13 08/10/2014   Pneumococcal Polysaccharide-23 05/16/2008, 08/23/2015   Td 02/20/1996, 11/21/2008, 10/10/2010   Tdap 03/06/2023   Zoster Recombinat (Shingrix) 03/06/2023   Zoster, Live 05/16/2008    TDAP status: Up to date  Pneumococcal vaccine status: Up to date  Covid-19 vaccine status: Information provided on how to obtain vaccines.   Qualifies for Shingles Vaccine? Yes   Zostavax completed No   Shingrix Completed?: Yes  Screening Tests Health Maintenance  Topic Date Due   COVID-19 Vaccine (4 - 2023-24 season) 06/14/2022   Colonoscopy  01/15/2023   FOOT EXAM  02/02/2023   HEMOGLOBIN A1C  02/06/2023   Diabetic kidney evaluation - Urine ACR  02/13/2023   OPHTHALMOLOGY EXAM  04/23/2023   Zoster Vaccines- Shingrix (2 of 2) 05/01/2023   Diabetic kidney evaluation - eGFR measurement  08/08/2023    Medicare Annual Wellness (AWV)  03/16/2024   DTaP/Tdap/Td (5 - Td or Tdap) 03/05/2033   Pneumonia Vaccine 4+ Years old  Completed   Hepatitis C Screening  Completed   HPV VACCINES  Aged Out   INFLUENZA VACCINE  Discontinued    Health Maintenance  Health Maintenance Due  Topic Date Due   COVID-19 Vaccine (4 - 2023-24 season) 06/14/2022   Colonoscopy  01/15/2023   FOOT EXAM  02/02/2023   HEMOGLOBIN A1C  02/06/2023   Diabetic kidney evaluation - Urine ACR  02/13/2023    Colorectal cancer screening: Referral to GI placed to Dr. Marina Goodell. Pt aware the office will call re: appt.  Lung Cancer Screening: (Low Dose CT Chest recommended if Age 66-80 years, 30 pack-year currently smoking OR have quit w/in 15years.) does not qualify.   Lung Cancer Screening Referral: n/a  Additional Screening:  Hepatitis C Screening: does qualify; Completed 08/24/15  Vision Screening: Recommended annual ophthalmology exams for early detection of glaucoma and other disorders of the eye. Is the patient up to date with their annual eye exam?  Yes  Who is the provider or what is the name of the office in which the patient attends annual eye exams? [facility] If pt is not established with a provider, would they like to be referred to a provider to establish care? No .   Dental Screening: Recommended annual dental exams for proper oral hygiene  Community Resource Referral / Chronic Care Management: CRR required this visit?  No   CCM required this visit?  No      Plan:     I have personally reviewed and noted the following in the patient's chart:   Medical and social history Use of alcohol, tobacco or illicit drugs  Current medications and supplements including opioid prescriptions. Patient is not currently taking opioid prescriptions. Functional ability and status Nutritional status Physical activity Advanced directives List of other physicians Hospitalizations, surgeries, and ER  visits in previous 12 months Vitals Screenings to include cognitive, depression, and falls Referrals and appointments  In addition, I have reviewed and discussed with patient certain preventive protocols, quality metrics, and best practice recommendations. A written personalized care plan for preventive services as well as general  preventive health recommendations were provided to patient.     Durwin Nora, [place]   10/19/1094   Due to this being a virtual visit, the after visit summary with patients personalized plan was offered to patient via mail or my-chart.  Patient would like to access on my-chart  Nurse Notes: No concerns

## 2023-03-20 ENCOUNTER — Encounter: Payer: Self-pay | Admitting: Neurology

## 2023-03-20 ENCOUNTER — Ambulatory Visit (INDEPENDENT_AMBULATORY_CARE_PROVIDER_SITE_OTHER): Payer: Medicare Other | Admitting: Neurology

## 2023-03-20 ENCOUNTER — Ambulatory Visit: Payer: Medicare Other | Admitting: Adult Health

## 2023-03-20 ENCOUNTER — Ambulatory Visit: Payer: Medicare Other | Admitting: Neurology

## 2023-03-20 VITALS — BP 105/70 | HR 82 | Ht 73.0 in | Wt 266.5 lb

## 2023-03-20 DIAGNOSIS — S065XAA Traumatic subdural hemorrhage with loss of consciousness status unknown, initial encounter: Secondary | ICD-10-CM | POA: Diagnosis not present

## 2023-03-20 DIAGNOSIS — R569 Unspecified convulsions: Secondary | ICD-10-CM

## 2023-03-20 MED ORDER — LEVETIRACETAM 1000 MG PO TABS: 1000.0000 mg | ORAL_TABLET | Freq: Two times a day (BID) | ORAL | 4 refills | Status: DC

## 2023-03-20 NOTE — Progress Notes (Signed)
Patient: Allen Myers Date of Birth: [birth year]  Reason for Visit: Follow up History from: Patient Primary Neurologist: Jessica/Sethi   ASSESSMENT AND PLAN 77 y.o. year old male with PMH of CAD s/p stent in 04/2014 on ASA , right SDH s/p fall with seizure in 03/2014 put on keppra was admitted again in 08/2014 for left SDH s/p fall. Had slight enlargement of left SDH over days and frequent focal seizure with aphasia and right cheek numbness. Increased keppra dose to 1000mg  bid. Had SDH evacuation on 09/12/14. In 09/2014, repeat CT showed evolving left SDH.    1.  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  Remains on Keppra 1000 mg twice daily.  Denies any seizure activity. He started playing pickle ball, just started few weeks ago, he is fearful of falling. Health has been good. Seeing cardiology, they are monitoring for PVD. 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 Last seizure was in 2015. No side effect from Keppra.  HISTORY 03/19/22 Jessica: Allen Myers is a 77 y.o. male who continues to be followed in this office for history of traumatic subdural hematoma 03/2014 with resultant seizure disorder.     Returns for yearly seizure follow-up.  Previously seen 02/20/2021 and doing well at that time.  He continues to do well and denies any seizure activity over the past year.  Reports compliance on Keppra 1000 mg twice daily, denies significant side effects.  Does mention some daytime fatigue, does nap daily.  He is routinely followed by PCP Dr. Jonny Ruiz. He does mention some difficulty with memory such as remembering a certain actor/actress's name that started a movie or a random place - he has not difficulty with  familiar/family names or familiar locations.  He otherwise has no other short-term memory difficulties.  He continues to work part-time and maintains ADLs and IADLs independently.  No further concerns at this time   REVIEW OF SYSTEMS: Out of a complete 14 system review of symptoms, the patient complains only of the following symptoms, and all other reviewed systems are negative.  See HPI  ALLERGIES: No Known Allergies  HOME MEDICATIONS: Outpatient Medications Prior to Visit  Medication Sig Dispense Refill   Ascorbic Acid (VITAMIN C PO) Take 1 tablet by mouth.     aspirin EC 81 MG tablet Take 81 mg by mouth daily.     atorvastatin (LIPITOR) 80 MG tablet TAKE 1 TABLET DAILY 90 tablet 2   carvedilol (COREG) 3.125 MG tablet Take 1 tablet (3.125 mg total) by mouth 2 (two) times daily with a meal. 180 tablet 3   cilostazol (PLETAL) 100 MG tablet Take 1 tablet (100 mg total) by mouth 2 (two) times daily. 180 tablet 3   doxycycline (VIBRA-TABS) 100 MG tablet Take 1 tablet (100 mg total) by mouth 2 (two) times daily. 20 tablet 0   levothyroxine (SYNTHROID) 137 MCG tablet Take 1 tablet (137 mcg total) by mouth daily before breakfast. 90 tablet 3   linagliptin (TRADJENTA) 5 MG TABS tablet Take 1 tablet (5 mg total) by mouth daily. 90 tablet 3   lisinopril (ZESTRIL) 2.5 MG tablet Take 1 tablet (2.5 mg total) by mouth daily. 90 tablet 3   meloxicam (MOBIC) 15 MG tablet Take  15 mg by mouth daily.     metFORMIN (GLUCOPHAGE) 1000 MG tablet TAKE 1 TABLET TWICE DAILY  WITH MEALS 180 tablet 1   omeprazole (PRILOSEC) 40 MG capsule Take 1 capsule (40 mg total) by mouth daily. 90 capsule 3   pioglitazone (ACTOS) 45 MG tablet Take 1 tablet (45 mg total) by mouth daily. 90 tablet 3   predniSONE (DELTASONE) 10 MG tablet 3 tabs by mouth per day for 3 days,2tabs per day for 3 days,1tab per day for 3 days 18 tablet 0   Semaglutide,0.25 or 0.5MG /DOS, (OZEMPIC, 0.25 OR 0.5 MG/DOSE,) 2 MG/1.5ML SOPN Inject 0.5 mg into  the skin once a week. 6 mL 3   valACYclovir (VALTREX) 1000 MG tablet Take 1 tablet (1,000 mg total) by mouth 2 (two) times daily as needed. 20 tablet 5   vitamin B-12 (CYANOCOBALAMIN) 1000 MCG tablet Take 1 tablet (1,000 mcg total) by mouth daily. 90 tablet 1   levETIRAcetam (KEPPRA) 1000 MG tablet TAKE 1 TABLET TWICE A DAY 180 tablet 0   No facility-administered medications prior to visit.    PAST MEDICAL HISTORY: Past Medical History:  Diagnosis Date   Allergy    Arthritis    CAD (coronary artery disease)    Erectile dysfunction    History of colon polyps    History of diverticulitis    HYPERTENSION    Hypothyroidism    Kidney stones    "passed it"   Melanoma (HCC) 12/10/2019   Peripheral arterial disease (HCC)    SAH (subarachnoid hemorrhage) (HCC)    Following syncopal episode   SDH (subdural hematoma) (HCC)    Following syncopal episode   Seizures (HCC)    08/2014 - controlled   Sleep apnea    "mild"  does not use CPAP   Sleep apnea in adult    Syncope and collapse    Type II diabetes mellitus (HCC)    type 2    PAST SURGICAL HISTORY: Past Surgical History:  Procedure Laterality Date   COLONOSCOPY  10/14/2012   polyp   CORONARY ANGIOPLASTY WITH STENT PLACEMENT  05/11/2014   "1"   CRANIOTOMY Left 09/12/2014   Procedure: CRANIOTOMY HEMATOMA EVACUATION SUBDURAL;  Surgeon: Maeola Harman, MD;  Location: [facility];  Service: Neurosurgery;  Laterality: Left;   EXTRACORPOREAL SHOCK WAVE LITHOTRIPSY Left 01/08/2021   Procedure: EXTRACORPOREAL SHOCK WAVE LITHOTRIPSY (ESWL);  Surgeon: Heloise Purpura, MD;  Location[facility];  Service: Urology;  Laterality: Left;   FRACTIONAL FLOW RESERVE WIRE  05/11/2014   Procedure: FRACTIONAL FLOW RESERVE WIRE;  Surgeon: Lesleigh Noe, MD;  Location: [facility] CATH LAB;  Service: Cardiovascular;;   LEFT HEART CATHETERIZATION WITH CORONARY ANGIOGRAM N/A 05/11/2014   Procedure: LEFT HEART CATHETERIZATION WITH CORONARY  ANGIOGRAM;  Surgeon: Lesleigh Noe, MD;  Location[facility] C[facility] LAB;  Service: Cardiovascular;  Laterality: N/A;   POLYPECTOMY      FAMILY HISTORY: Family History  Problem Relation Age of Onset   Lung cancer Father        dad was a smoker   Heart disease Father    AAA (abdominal aortic aneurysm) Father    Heart disease Mother    Ataxia Maternal Grandmother    Dementia Maternal Grandmother    Aplastic anemia Sister    Prostate cancer Brother    Lung cancer Cousin    Colon cancer Neg Hx    Rectal cancer Neg Hx    Stomach cancer Neg Hx  Colon polyps Neg Hx    Esophageal cancer Neg Hx     SOCIAL HISTORY: Social History   Socioeconomic History   Marital status: Married    Spouse name: Lafonda Mosses   Number of children: 3   Years of education: 12   Highest education level: Not on file  Occupational History   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)    Employer: [employer]   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[place]   Occupation: [phone] (CELL)    Employer: [employer]  Tobacco Use   Smoking status: Former    Packs/day: 1.00    Years: 30.00    Additional pack years: 0.00    Total pack years: 30.00    Types: Cigarettes    Quit date: 10/15/1983    Years since quitting: 39.4   Smokeless tobacco: Never  Vaping Use   Vaping Use: Never used  Substance and Sexual Activity   Alcohol use: No   Drug use: No   Sexual activity: Yes  Other Topics Concern   Not on file  Social History Narrative   Patient is married with 3 daughters 67, 26, 81 and 2 grandchildren.   Patient is right handed.   Patient has hs education.   Patient drinks decaf, and soda occasionally.   Social Determinants of Health   Financial Resource Strain: Low Risk  (03/17/2023)   Overall Financial Resource Strain (CARDIA)    Difficulty of Paying Living Expenses: Not hard at all  Food Insecurity: No Food Insecurity (03/17/2023)   Hunger Vital Sign    Worried About Running Out of Food in the Last  Year: Never true    Ran Out of Food in the Last Year: Never true  Transportation Needs: No Transportation Needs (03/17/2023)   PRAPARE - Administrator, Civil Service (Medical): No    Lack of Transportation (Non-Medical): No  Physical Activity: Insufficiently Active (03/17/2023)   Exercise Vital Sign    Days of Exercise per Week: 3 days    Minutes of Exercise per Session: 40 min  Stress: No Stress Concern Present (03/17/2023)   [employer] of Occupational Health - Occupational Stress Questionnaire    Feeling of Stress : Not at all  Social Connections: Moderately Integrated (03/17/2023)   Social Connection and Isolation Panel [NHANES]    Frequency of Communication with Friends and Family: Once a week    Frequency of Social Gatherings with Friends and Family: Once a week    Attends Religious Services: More than 4 times per year    Active Member of [employer] or Organizations: Yes    Attends Banker Meetings: More than 4 times per year    Marital Status: Married  Catering manager Violence: Not At Risk (03/17/2023)   Humiliation, Afraid, Rape, and Kick questionnaire    Fear of Current or Ex-Partner: No    Emotionally Abused: No    Physically Abused: No    Sexually Abused: No    PHYSICAL EXAM  Vitals:   03/20/23 1253 03/20/23 1307  BP: (!) 96/52 105/70  Pulse: 82   Weight: 266 lb 8 oz (120.9 kg)   Height: 6\' 1"  (1.854 m)    Body mass index is 35.16 kg/m.  Generalized: Well developed, in no acute distress  Neurological examination  Mentation: Alert oriented to time, place, history taking. Follows all commands speech and language fluent Cranial nerve II-XII: Pupils were equal round reactive to light. Extraocular movements were full, visual field were full on confrontational  test. Facial sensation and strength were normal. . Head turning and shoulder shrug  were normal and symmetric. Motor: The motor testing reveals 5 over 5 strength of all 4 extremities. Good  symmetric motor tone is noted throughout.  Sensory: Sensory testing is intact to soft touch on all 4 extremities. No evidence of extinction is noted.  Coordination: Cerebellar testing reveals good finger-nose-finger and heel-to-shin bilaterally.  Gait and station: Gait is normal.  Reflexes: Deep tendon reflexes are symmetric and normal bilaterally.   DIAGNOSTIC DATA (LABS, IMAGING, TESTING) - I reviewed patient records, labs, notes, testing and imaging myself where available.  Lab Results  Component Value Date   WBC 6.8 02/12/2022   HGB 13.8 02/12/2022   HCT 41.2 02/12/2022   MCV 90.1 02/12/2022   PLT 127.0 (L) 02/12/2022      Component Value Date/Time   NA 136 08/07/2022 1019   K 4.7 08/07/2022 1019   CL 102 08/07/2022 1019   CO2 27 08/07/2022 1019   GLUCOSE 140 (H) 08/07/2022 1019   BUN 19 08/07/2022 1019   CREATININE 0.92 08/07/2022 1019   CREATININE 0.89 11/14/2016 0917   CALCIUM 9.3 08/07/2022 1019   PROT 7.1 08/07/2022 1019   ALBUMIN 4.2 08/07/2022 1019   AST 17 08/07/2022 1019   ALT 16 08/07/2022 1019   ALKPHOS 97 08/07/2022 1019   BILITOT 0.5 08/07/2022 1019   GFRNONAA >60 06/04/2019 1829   GFRNONAA 74 06/16/2014 0808   GFRAA >60 06/04/2019 1829   GFRAA 86 06/16/2014 0808   Lab Results  Component Value Date   CHOL 96 08/07/2022   HDL 45.20 08/07/2022   LDLCALC 39 08/07/2022   LDLDIRECT 85.7 06/11/2013   TRIG 58.0 08/07/2022   CHOLHDL 2 08/07/2022   Lab Results  Component Value Date   HGBA1C 7.4 (H) 08/07/2022   Lab Results  Component Value Date   VITAMINB12 1,343 (H) 02/12/2022   Lab Results  Component Value Date   TSH 0.30 (L) 08/07/2022    Margie Ege, AGNP-C, DNP 03/20/2023, 1:09 PM [facility] [address], [address] [place], [place] [address] [phone]

## 2023-03-25 ENCOUNTER — Telehealth: Payer: Self-pay | Admitting: Internal Medicine

## 2023-03-25 NOTE — Telephone Encounter (Signed)
Pt should have an OV prior to being scheduled for colon

## 2023-03-25 NOTE — Telephone Encounter (Signed)
Inbound call from patient wanting to schedule colon procedure. Please advise if patient needs an ov or if he can be directly scheduled for procedure.  Thank you

## 2023-03-26 NOTE — Telephone Encounter (Signed)
Patient scheduled for OV 

## 2023-05-07 ENCOUNTER — Other Ambulatory Visit: Payer: Self-pay | Admitting: Internal Medicine

## 2023-05-07 ENCOUNTER — Other Ambulatory Visit: Payer: Self-pay

## 2023-05-30 ENCOUNTER — Other Ambulatory Visit: Payer: Self-pay | Admitting: Internal Medicine

## 2023-06-09 DIAGNOSIS — Z85828 Personal history of other malignant neoplasm of skin: Secondary | ICD-10-CM | POA: Diagnosis not present

## 2023-06-09 DIAGNOSIS — L814 Other melanin hyperpigmentation: Secondary | ICD-10-CM | POA: Diagnosis not present

## 2023-06-09 DIAGNOSIS — L821 Other seborrheic keratosis: Secondary | ICD-10-CM | POA: Diagnosis not present

## 2023-06-09 DIAGNOSIS — D1801 Hemangioma of skin and subcutaneous tissue: Secondary | ICD-10-CM | POA: Diagnosis not present

## 2023-06-09 DIAGNOSIS — L570 Actinic keratosis: Secondary | ICD-10-CM | POA: Diagnosis not present

## 2023-06-09 DIAGNOSIS — D225 Melanocytic nevi of trunk: Secondary | ICD-10-CM | POA: Diagnosis not present

## 2023-06-09 DIAGNOSIS — Z85820 Personal history of malignant melanoma of skin: Secondary | ICD-10-CM | POA: Diagnosis not present

## 2023-06-16 ENCOUNTER — Other Ambulatory Visit: Payer: Self-pay | Admitting: Internal Medicine

## 2023-06-17 ENCOUNTER — Other Ambulatory Visit: Payer: Self-pay

## 2023-06-26 DIAGNOSIS — N200 Calculus of kidney: Secondary | ICD-10-CM | POA: Diagnosis not present

## 2023-06-26 DIAGNOSIS — N401 Enlarged prostate with lower urinary tract symptoms: Secondary | ICD-10-CM | POA: Diagnosis not present

## 2023-06-26 DIAGNOSIS — R351 Nocturia: Secondary | ICD-10-CM | POA: Diagnosis not present

## 2023-06-27 ENCOUNTER — Ambulatory Visit (INDEPENDENT_AMBULATORY_CARE_PROVIDER_SITE_OTHER): Payer: Medicare Other | Admitting: Internal Medicine

## 2023-06-27 ENCOUNTER — Encounter: Payer: Self-pay | Admitting: Internal Medicine

## 2023-06-27 ENCOUNTER — Other Ambulatory Visit: Payer: Self-pay

## 2023-06-27 VITALS — BP 136/80 | HR 60 | Ht 73.0 in | Wt 269.0 lb

## 2023-06-27 DIAGNOSIS — R1319 Other dysphagia: Secondary | ICD-10-CM

## 2023-06-27 DIAGNOSIS — K222 Esophageal obstruction: Secondary | ICD-10-CM

## 2023-06-27 DIAGNOSIS — Z86010 Personal history of colonic polyps: Secondary | ICD-10-CM

## 2023-06-27 DIAGNOSIS — K219 Gastro-esophageal reflux disease without esophagitis: Secondary | ICD-10-CM

## 2023-06-27 MED ORDER — NA SULFATE-K SULFATE-MG SULF 17.5-3.13-1.6 GM/177ML PO SOLN: ORAL | 0 refills | Status: DC

## 2023-06-27 NOTE — Progress Notes (Signed)
HISTORY OF PRESENT ILLNESS:  Allen Myers is a 77 y.o. male with past medical history as listed below including coronary artery disease, morbid obesity, and sleep apnea.  He has been seen in this office for GERD complicated by peptic stricture and adenomatous colon polyps.  He presents today regarding surveillance colonoscopy and recurrent issues with dysphagia.  First, patient reports a 1 year history of worsening intermittent solid food dysphagia and occasional liquid dysphagia.  Does have a history of GERD with esophagitis for which she takes omeprazole 40 mg daily.  For the most part, reflux symptoms controlled.  His last upper endoscopy was performed February 02, 2016.  He was found to have distal esophagitis and a benign distal esophageal stricture which was dilated with 54 [place] Mal[place] dilator.  Also inflammatory appearing proximal gastric polyps which were biopsied and found to be inflammatory.  He states that esophageal dilation was quite helpful.  He is interested in repeat dilation.  Next, he has undergone multiple colonoscopies in the past and has a history of adenomatous colon polyps.  His last examination was 2019.  Follow-up in 5 years recommended.  He denies any active lower GI complaints.  Overall his health has been good.  He is active with pickleball.  He is on multiple agents for his diabetes.  Review of blood work from October 2023 shows normal comprehensive metabolic panel except for glucose of 140.  REVIEW OF SYSTEMS:  All non-GI ROS negative unless otherwise stated in the HPI except for back pain, fatigue  Past Medical History:  Diagnosis Date   Allergy    Arthritis    CAD (coronary artery disease)    Erectile dysfunction    History of colon polyps    History of diverticulitis    HYPERTENSION    Hypothyroidism    Kidney stones    "passed it"   Melanoma (HCC) 12/10/2019   Peripheral arterial disease (HCC)    SAH (subarachnoid hemorrhage) (HCC)    Following  syncopal episode   SDH (subdural hematoma) (HCC)    Following syncopal episode   Seizures (HCC)    08/2014 - controlled   Sleep apnea    "mild"  does not use CPAP   Sleep apnea in adult    Syncope and collapse    Type II diabetes mellitus (HCC)    type 2    Past Surgical History:  Procedure Laterality Date   COLONOSCOPY  10/14/2012   polyp   CORONARY ANGIOPLASTY WITH STENT PLACEMENT  05/11/2014   "1"   CRANIOTOMY Left 09/12/2014   Procedure: CRANIOTOMY HEMATOMA EVACUATION SUBDURAL;  Surgeon: Maeola Harman, MD;  Location: [facility];  Service: Neurosurgery;  Laterality: Left;   EXTRACORPOREAL SHOCK WAVE LITHOTRIPSY Left 01/08/2021   Procedure: EXTRACORPOREAL SHOCK WAVE LITHOTRIPSY (ESWL);  Surgeon: Heloise Purpura, MD;  Location[facility];  Service: Urology;  Laterality: Left;   FRACTIONAL FLOW RESERVE WIRE  05/11/2014   Procedure: FRACTIONAL FLOW RESERVE WIRE;  Surgeon: Lesleigh Noe, MD;  Location: [facility] CATH LAB;  Service: Cardiovascular;;   LEFT HEART CATHETERIZATION WITH CORONARY ANGIOGRAM N/A 05/11/2014   Procedure: LEFT HEART CATHETERIZATION WITH CORONARY ANGIOGRAM;  Surgeon: Lesleigh Noe, MD;  Location: [facility] CATH LAB;  Service: Cardiovascular;  Laterality: N/A;   POLYPECTOMY      Social History Allen Myers  reports that he quit smoking about 39 years ago. His smoking use included cigarettes. He started smoking about 69 years ago. He has  a 30 pack-year smoking history. He has never used smokeless tobacco. He reports that he does not drink alcohol and does not use drugs.  family history includes AAA (abdominal aortic aneurysm) in his father; Aplastic anemia in his sister; Ataxia in his maternal grandmother; Dementia in his maternal grandmother; Heart disease in his father and mother; Lung cancer in his cousin and father; Prostate cancer in his brother.  No Known Allergies     PHYSICAL EXAMINATION: Vital signs: BP 136/80   Pulse 60   Ht 6\' 1"   (1.854 m)   Wt 269 lb (122 kg)   BMI 35.49 kg/m   Constitutional: generally well-appearing, no acute distress Psychiatric: alert and oriented x3, cooperative Eyes: extraocular movements intact, anicteric, conjunctiva pink Mouth: oral pharynx moist, no lesions Neck: supple no lymphadenopathy Cardiovascular: heart regular rate and rhythm, no murmur Lungs: clear to auscultation bilaterally Abdomen: soft, obese, nontender, nondistended, no obvious ascites, no peritoneal signs, normal bowel sounds, no organomegaly Rectal: Deferred until colonoscopy Extremities: no clubbing or cyanosis.  Trace lower extremity edema bilaterally Skin: no lesions on visible extremities Neuro: No focal deficits.  Cranial nerves intact  ASSESSMENT:  1.  Recurrent dysphagia.  Likely secondary to known peptic stricture 2.  GERD with a history of nonerosive esophagitis, on omeprazole 40 mg 3.  History of adenomatous colon polyps.  Due for surveillance colonoscopy 4.  Morbid obesity 5.  Multiple medical problems.  Stable   PLAN:  1.  Reflux precautions with attention to weight loss 2.  Continue omeprazole daily 3.  Schedule upper endoscopy with esophageal dilation.  Patient is higher than average risk due to his age, body habitus, need to adjust medications preprocedure and comorbidities. 4.  Schedule surveillance colonoscopy.  Patient is high risk as above. 5.  Hold diabetic medications the day of the procedure to avoid unwanted hypoglycemia.  He tells me that he has not started and is not planning on starting Ozempic (which is on his medication list).  Okay to stay on aspirin and Pletal. A total time of 60 minutes was spent preparing to see the patient, reviewing outside data, obtaining comprehensive history, performing medically appropriate physical examination, counseling and educating the patient regarding multiple above listed issues, ordering multiple endoscopic procedures including therapeutic, and  documenting clinical information in the health record

## 2023-06-27 NOTE — Patient Instructions (Addendum)
We did not schedule the procedure(s) today. Please contact our office at [phone] should you decide to have the procedure completed. You will be scheduled for a pre-visit and procedure at that time.   _______________________________________________________  If your blood pressure at your visit was 140/90 or greater, please contact your primary care physician to follow up on this.  _______________________________________________________  If you are age 36 or older, your body mass index should be between 23-30. Your Body mass index is 35.49 kg/m. If this is out of the aforementioned range listed, please consider follow up with your Primary Care Provider.  If you are age 81 or younger, your body mass index should be between 19-25. Your Body mass index is 35.49 kg/m. If this is out of the aformentioned range listed, please consider follow up with your Primary Care Provider.   ________________________________________________________  The [facility] providers would like to encourage you to use [facility] to communicate with providers for non-urgent requests or questions.  Due to long hold times on the telephone, sending your provider a message by [facility] may be a faster and more efficient way to get a response.  Please allow 48 business hours for a response.  Please remember that this is for non-urgent requests.  _______________________________________________________  Thank you for entrusting me with your care and choosing [facility].  Dr Hilarie Fredrickson

## 2023-06-28 ENCOUNTER — Encounter: Payer: Self-pay | Admitting: Internal Medicine

## 2023-07-17 ENCOUNTER — Ambulatory Visit (INDEPENDENT_AMBULATORY_CARE_PROVIDER_SITE_OTHER): Payer: Medicare Other | Admitting: Internal Medicine

## 2023-07-17 ENCOUNTER — Encounter: Payer: Self-pay | Admitting: Internal Medicine

## 2023-07-17 VITALS — BP 118/62 | HR 67 | Temp 98.3°F | Ht 73.0 in | Wt 258.0 lb

## 2023-07-17 DIAGNOSIS — E119 Type 2 diabetes mellitus without complications: Secondary | ICD-10-CM

## 2023-07-17 DIAGNOSIS — E7800 Pure hypercholesterolemia, unspecified: Secondary | ICD-10-CM

## 2023-07-17 DIAGNOSIS — R972 Elevated prostate specific antigen [PSA]: Secondary | ICD-10-CM

## 2023-07-17 DIAGNOSIS — E538 Deficiency of other specified B group vitamins: Secondary | ICD-10-CM | POA: Diagnosis not present

## 2023-07-17 DIAGNOSIS — Z7984 Long term (current) use of oral hypoglycemic drugs: Secondary | ICD-10-CM

## 2023-07-17 DIAGNOSIS — Z7985 Long-term (current) use of injectable non-insulin antidiabetic drugs: Secondary | ICD-10-CM

## 2023-07-17 DIAGNOSIS — I10 Essential (primary) hypertension: Secondary | ICD-10-CM | POA: Diagnosis not present

## 2023-07-17 DIAGNOSIS — E559 Vitamin D deficiency, unspecified: Secondary | ICD-10-CM

## 2023-07-17 DIAGNOSIS — J301 Allergic rhinitis due to pollen: Secondary | ICD-10-CM

## 2023-07-17 DIAGNOSIS — E039 Hypothyroidism, unspecified: Secondary | ICD-10-CM

## 2023-07-17 DIAGNOSIS — Z794 Long term (current) use of insulin: Secondary | ICD-10-CM

## 2023-07-17 LAB — BASIC METABOLIC PANEL
BUN: 16 mg/dL (ref 6–23)
CO2: 28 meq/L (ref 19–32)
Calcium: 9.4 mg/dL (ref 8.4–10.5)
Chloride: 101 meq/L (ref 96–112)
Creatinine, Ser: 0.9 mg/dL (ref 0.40–1.50)
GFR: 82.55 mL/min (ref >60.00)
Glucose, Bld: 147 mg/dL — ABNORMAL HIGH (ref 70–99)
Potassium: 4.8 meq/L (ref 3.5–5.1)
Sodium: 137 meq/L (ref 135–145)

## 2023-07-17 LAB — HEMOGLOBIN A1C: Hgb A1c MFr Bld: 7.4 % — ABNORMAL HIGH (ref 4.6–6.5)

## 2023-07-17 LAB — MICROALBUMIN / CREATININE URINE RATIO
Creatinine,U: 112.4 mg/dL
Microalb Creat Ratio: 1.1 mg/g (ref 0.0–30.0)
Microalb, Ur: 1.2 mg/dL (ref 0.0–1.9)

## 2023-07-17 LAB — CBC WITH DIFFERENTIAL/PLATELET
Basophils Absolute: 0 10*3/uL (ref 0.0–0.1)
Basophils Relative: 0.6 % (ref 0.0–3.0)
Eosinophils Absolute: 0.3 10*3/uL (ref 0.0–0.7)
Eosinophils Relative: 3.8 % (ref 0.0–5.0)
HCT: 45.5 % (ref 39.0–52.0)
Hemoglobin: 14.7 g/dL (ref 13.0–17.0)
Lymphocytes Relative: 22 % (ref 12.0–46.0)
Lymphs Abs: 1.7 10*3/uL (ref 0.7–4.0)
MCHC: 32.4 g/dL (ref 30.0–36.0)
MCV: 90.6 fL (ref 78.0–100.0)
Monocytes Absolute: 0.7 10*3/uL (ref 0.1–1.0)
Monocytes Relative: 9.1 % (ref 3.0–12.0)
Neutro Abs: 4.8 10*3/uL (ref 1.4–7.7)
Neutrophils Relative %: 64.5 % (ref 43.0–77.0)
Platelets: 136 10*3/uL — ABNORMAL LOW (ref 150.0–400.0)
RBC: 5.02 Mil/uL (ref 4.22–5.81)
RDW: 15.3 % (ref 11.5–15.5)
WBC: 7.5 10*3/uL (ref 4.0–10.5)

## 2023-07-17 LAB — URINALYSIS, ROUTINE W REFLEX MICROSCOPIC
Bilirubin Urine: NEGATIVE
Hgb urine dipstick: NEGATIVE
Ketones, ur: NEGATIVE
Leukocytes,Ua: NEGATIVE
Nitrite: NEGATIVE
RBC / HPF: NONE SEEN (ref 0–?)
Specific Gravity, Urine: >=1.03 — AB (ref 1.000–1.030)
Total Protein, Urine: NEGATIVE
Urine Glucose: NEGATIVE
Urobilinogen, UA: 0.2 (ref 0.0–1.0)
pH: 6 (ref 5.0–8.0)

## 2023-07-17 LAB — LIPID PANEL
Cholesterol: 111 mg/dL (ref 0–200)
HDL: 49.4 mg/dL (ref >39.00)
LDL Cholesterol: 46 mg/dL (ref 0–99)
NonHDL: 61.95
Total CHOL/HDL Ratio: 2
Triglycerides: 78 mg/dL (ref 0.0–149.0)
VLDL: 15.6 mg/dL (ref 0.0–40.0)

## 2023-07-17 LAB — HEPATIC FUNCTION PANEL
ALT: 21 U/L (ref 0–53)
AST: 20 U/L (ref 0–37)
Albumin: 4.3 g/dL (ref 3.5–5.2)
Alkaline Phosphatase: 118 U/L — ABNORMAL HIGH (ref 39–117)
Bilirubin, Direct: 0.1 mg/dL (ref 0.0–0.3)
Total Bilirubin: 0.6 mg/dL (ref 0.2–1.2)
Total Protein: 7.1 g/dL (ref 6.0–8.3)

## 2023-07-17 LAB — PSA: PSA: 2.54 ng/mL (ref 0.10–4.00)

## 2023-07-17 LAB — TSH: TSH: 0.59 u[IU]/mL (ref 0.35–5.50)

## 2023-07-17 LAB — VITAMIN D 25 HYDROXY (VIT D DEFICIENCY, FRACTURES): VITD: 43.13 ng/mL (ref 30.00–100.00)

## 2023-07-17 LAB — VITAMIN B12: Vitamin B-12: >1501 pg/mL — ABNORMAL HIGH (ref 211–911)

## 2023-07-17 MED ORDER — TIRZEPATIDE 2.5 MG/0.5ML ~~LOC~~ SOAJ: 2.5000 mg | SUBCUTANEOUS | 11 refills | Status: DC

## 2023-07-17 NOTE — Patient Instructions (Addendum)
Please take all new medication as prescribed - the mounjaro 2.5 mg weekly, and call (or mychart message) in 1 month for the higher dose for better wt loss  Please continue all other medications as before, and refills have been done if requested.  Please have the pharmacy call with any other refills you may need.  Please keep your appointments with your specialists as you may have planned  Please go to the LAB at the blood drawing area for the tests to be done  You will be contacted by phone if any changes need to be made immediately.  Otherwise, you will receive a letter about your results with an explanation, but please check with MyChart first.  Please make an Appointment to return in 6 months, or sooner if needed

## 2023-07-17 NOTE — Progress Notes (Signed)
Patient ID: COYLE HERPIN, male   DOB: October 02, 1946, 77 y.o.   MRN: [id number]        Chief Complaint: follow up HTN, HLD and DM/obesity, low thyroid, b12 deficiency       HPI:  Allen Myers is a 77 y.o. male here overall doing ok,  Pt denies chest pain, increased sob or doe, wheezing, orthopnea, PND, increased LE swelling, palpitations, dizziness or syncope.   Pt denies polydipsia, polyuria, or new focal neuro s/s.    Pt denies fever, wt loss, night sweats, loss of appetite, or other constitutional symptoms  Denies hyper or hypo thyroid symptoms such as voice, skin or hair change.  Has colonoscopy and EGD sched for oct 14.   Did not tolerate ozempic, but willing to try mounjaro after GI studies.  Does have several wks ongoing nasal allergy symptoms with clearish congestion, itch and sneezing, without fever, pain, ST, cough, swelling or wheezing. Wt Readings from Last 3 Encounters:  07/17/23 258 lb (117 kg)  06/27/23 269 lb (122 kg)  03/20/23 266 lb 8 oz (120.9 kg)   BP Readings from Last 3 Encounters:  07/17/23 118/62  06/27/23 136/80  03/20/23 105/70         Past Medical History:  Diagnosis Date   Allergy    Arthritis    CAD (coronary artery disease)    Erectile dysfunction    History of colon polyps    History of diverticulitis    HYPERTENSION    Hypothyroidism    Kidney stones    "passed it"   Melanoma (HCC) 12/10/2019   Peripheral arterial disease (HCC)    SAH (subarachnoid hemorrhage) (HCC)    Following syncopal episode   SDH (subdural hematoma) (HCC)    Following syncopal episode   Seizures (HCC)    08/2014 - controlled   Sleep apnea    "mild"  does not use CPAP   Sleep apnea in adult    Syncope and collapse    Type II diabetes mellitus (HCC)    type 2   Past Surgical History:  Procedure Laterality Date   COLONOSCOPY  10/14/2012   polyp   CORONARY ANGIOPLASTY WITH STENT PLACEMENT  05/11/2014   "1"   CRANIOTOMY Left 09/12/2014   Procedure: CRANIOTOMY HEMATOMA  EVACUATION SUBDURAL;  Surgeon: Maeola Harman, MD;  Location: [facility];  Service: Neurosurgery;  Laterality: Left;   EXTRACORPOREAL SHOCK WAVE LITHOTRIPSY Left 01/08/2021   Procedure: EXTRACORPOREAL SHOCK WAVE LITHOTRIPSY (ESWL);  Surgeon: Heloise Purpura, MD;  Location: [facility];  Service: Urology;  Laterality: Left;   FRACTIONAL FLOW RESERVE WIRE  05/11/2014   Procedure: FRACTIONAL FLOW RESERVE WIRE;  Surgeon: Lesleigh Noe, MD;  Location: [facility] CATH LAB;  Service: Cardiovascular;;   LEFT HEART CATHETERIZATION WITH CORONARY ANGIOGRAM N/A 05/11/2014   Procedure: LEFT HEART CATHETERIZATION WITH CORONARY ANGIOGRAM;  Surgeon: Lesleigh Noe, MD;  Location: [facility] CATH LAB;  Service: Cardiovascular;  Laterality: N/A;   POLYPECTOMY      reports that he quit smoking about 39 years ago. His smoking use included cigarettes. He started smoking about 69 years ago. He has a 30 pack-year smoking history. He has never used smokeless tobacco. He reports that he does not drink alcohol and does not use drugs. family history includes AAA (abdominal aortic aneurysm) in his father; Aplastic anemia in his sister; Ataxia in his maternal grandmother; Dementia in his maternal grandmother; Heart disease in his father and mother; Lung cancer  in his cousin and father; Prostate cancer in his brother. No Known Allergies Current Outpatient Medications on File Prior to Visit  Medication Sig Dispense Refill   Ascorbic Acid (VITAMIN C PO) Take 1 tablet by mouth.     aspirin EC 81 MG tablet Take 81 mg by mouth daily.     atorvastatin (LIPITOR) 80 MG tablet TAKE 1 TABLET DAILY 90 tablet 2   carvedilol (COREG) 3.125 MG tablet Take 1 tablet (3.125 mg total) by mouth 2 (two) times daily with a meal. 180 tablet 3   cilostazol (PLETAL) 100 MG tablet Take 1 tablet (100 mg total) by mouth 2 (two) times daily. 180 tablet 3   doxycycline (VIBRA-TABS) 100 MG tablet Take 1 tablet (100 mg total) by mouth 2 (two) times  daily. 20 tablet 0   levETIRAcetam (KEPPRA) 1000 MG tablet Take 1 tablet (1,000 mg total) by mouth 2 (two) times daily. 180 tablet 4   levothyroxine (SYNTHROID) 137 MCG tablet Take 1 tablet (137 mcg total) by mouth daily before breakfast. 90 tablet 3   linagliptin (TRADJENTA) 5 MG TABS tablet Take 1 tablet (5 mg total) by mouth daily. 90 tablet 3   lisinopril (ZESTRIL) 2.5 MG tablet Take 1 tablet (2.5 mg total) by mouth daily. 90 tablet 3   meloxicam (MOBIC) 15 MG tablet Take 15 mg by mouth daily.     metFORMIN (GLUCOPHAGE) 1000 MG tablet TAKE 1 TABLET TWICE DAILY  WITH MEALS 180 tablet 1   Na Sulfate-K Sulfate-Mg Sulf 17.5-3.13-1.6 GM/177ML SOLN Use as directed; may use generic; goodrx card if insurance will not cover generic 354 mL 0   omeprazole (PRILOSEC) 40 MG capsule Take 1 capsule (40 mg total) by mouth daily. 90 capsule 3   pioglitazone (ACTOS) 45 MG tablet Take 1 tablet (45 mg total) by mouth daily. 90 tablet 3   predniSONE (DELTASONE) 10 MG tablet 3 tabs by mouth per day for 3 days,2tabs per day for 3 days,1tab per day for 3 days 18 tablet 0   Semaglutide,0.25 or 0.5MG /DOS, (OZEMPIC, 0.25 OR 0.5 MG/DOSE,) 2 MG/1.5ML SOPN Inject 0.5 mg into the skin once a week. 6 mL 3   valACYclovir (VALTREX) 1000 MG tablet Take 1 tablet (1,000 mg total) by mouth 2 (two) times daily as needed. 20 tablet 5   vitamin B-12 (CYANOCOBALAMIN) 1000 MCG tablet Take 1 tablet (1,000 mcg total) by mouth daily. 90 tablet 1   No current facility-administered medications on file prior to visit.        ROS:  All others reviewed and negative.  Objective        PE:  BP 118/62 (BP Location: Right Arm, Patient Position: Sitting, Cuff Size: Normal)   Pulse 67   Temp 98.3 F (36.8 C) (Oral)   Ht 6\' 1"  (1.854 m)   Wt 258 lb (117 kg)   SpO2 96%   BMI 34.04 kg/m                 Constitutional: Pt appears in NAD               HENT: Head: NCAT.                Right Ear: External ear normal.                 Left  Ear: External ear normal.                Eyes: . Pupils are equal, round,  and reactive to light. Conjunctivae and EOM are normal               Nose: without d/c or deformity               Neck: Neck supple. Gross normal ROM               Cardiovascular: Normal rate and regular rhythm.                 Pulmonary/Chest: Effort normal and breath sounds without rales or wheezing.                Abd:  Soft, NT, ND, + BS, no organomegaly               Neurological: Pt is alert. At baseline orientation, motor grossly intact               Skin: Skin is warm. No rashes, no other new lesions, LE edema - none               Psychiatric: Pt behavior is normal without agitation   Micro: none  Cardiac tracings I have personally interpreted today:  none  Pertinent Radiological findings (summarize): none   Lab Results  Component Value Date   WBC 7.5 07/17/2023   HGB 14.7 07/17/2023   HCT 45.5 07/17/2023   PLT 136.0 (L) 07/17/2023   GLUCOSE 147 (H) 07/17/2023   CHOL 111 07/17/2023   TRIG 78.0 07/17/2023   HDL 49.40 07/17/2023   LDLDIRECT 85.7 06/11/2013   LDLCALC 46 07/17/2023   ALT 21 07/17/2023   AST 20 07/17/2023   NA 137 07/17/2023   K 4.8 07/17/2023   CL 101 07/17/2023   CREATININE 0.90 07/17/2023   BUN 16 07/17/2023   CO2 28 07/17/2023   TSH 0.59 07/17/2023   PSA 2.54 07/17/2023   INR 1.07 09/06/2014   HGBA1C 7.4 (H) 07/17/2023   MICROALBUR 1.2 07/17/2023   Assessment/Plan:  KIPLEY CAMMON is a 77 y.o. White or Caucasian [1] male with  has a past medical history of Allergy, Arthritis, CAD (coronary artery disease), Erectile dysfunction, History of colon polyps, History of diverticulitis, HYPERTENSION, Hypothyroidism, Kidney stones, Melanoma (HCC) (12/10/2019), Peripheral arterial disease (HCC), SAH (subarachnoid hemorrhage) (HCC), SDH (subdural hematoma) (HCC), Seizures (HCC), Sleep apnea, Sleep apnea in adult, Syncope and collapse, and Type II diabetes mellitus  (HCC).  Hypothyroidism Lab Results  Component Value Date   TSH 0.59 07/17/2023   Stable, pt to continue levothyroxine 137 mcg qd   Diabetes mellitus type 2, noninsulin dependent (HCC) Lab Results  Component Value Date   HGBA1C 7.4 (H) 07/17/2023   Uncontrolled, goal A1c < 7, pt to continue current medical treatment tradjenta 5 every day, metformin 1000 bid, actos 45 every day and start mounjaro 2.5 mg weekly   Essential hypertension BP Readings from Last 3 Encounters:  07/17/23 118/62  06/27/23 136/80  03/20/23 105/70   Stable, pt to continue medical treatment coreg 3,125 bid, lisnopril 2.5 qd   HLD (hyperlipidemia) Lab Results  Component Value Date   LDLCALC 46 07/17/2023   Stable, pt to continue current statin lipitor 80 qd   B12 deficiency Lab Results  Component Value Date   VITAMINB12 >1501 (H) 07/17/2023   Stable, cont oral replacement - b12 1000 mcg qd   Increased prostate specific antigen (PSA) velocity Lab Results  Component Value Date   PSA 2.54 07/17/2023   PSA 2.39 02/12/2022   PSA  2.24 07/11/2021   Stable, cont currrent tx  Vitamin D deficiency Last vitamin D Lab Results  Component Value Date   VD25OH 43.13 07/17/2023   Stable, cont oral replacement   Seasonal allergic rhinitis due to pollen Mild to mod, for OTC allegra 180 every day prn,  to f/u any worsening symptoms or concerns  Followup: Return in about 6 months (around 01/15/2024).  Oliver Barre, MD 07/20/2023 6:18 PM [facility]

## 2023-07-20 ENCOUNTER — Encounter: Payer: Self-pay | Admitting: Internal Medicine

## 2023-07-20 NOTE — Assessment & Plan Note (Signed)
Lab Results  Component Value Date   VITAMINB12 >1501 (H) 07/17/2023   Stable, cont oral replacement - b12 1000 mcg qd

## 2023-07-20 NOTE — Assessment & Plan Note (Signed)
Lab Results  Component Value Date   HGBA1C 7.4 (H) 07/17/2023   Uncontrolled, goal A1c < 7, pt to continue current medical treatment tradjenta 5 every day, metformin 1000 bid, actos 45 every day and start mounjaro 2.5 mg weekly

## 2023-07-20 NOTE — Assessment & Plan Note (Signed)
Lab Results  Component Value Date   TSH 0.59 07/17/2023   Stable, pt to continue levothyroxine 137 mcg qd

## 2023-07-20 NOTE — Assessment & Plan Note (Signed)
BP Readings from Last 3 Encounters:  07/17/23 118/62  06/27/23 136/80  03/20/23 105/70   Stable, pt to continue medical treatment coreg 3,125 bid, lisnopril 2.5 qd

## 2023-07-20 NOTE — Assessment & Plan Note (Signed)
Lab Results  Component Value Date   LDLCALC 46 07/17/2023   Stable, pt to continue current statin lipitor 80 qd

## 2023-07-20 NOTE — Assessment & Plan Note (Signed)
Lab Results  Component Value Date   PSA 2.54 07/17/2023   PSA 2.39 02/12/2022   PSA 2.24 07/11/2021   Stable, cont currrent tx

## 2023-07-20 NOTE — Assessment & Plan Note (Signed)
Last vitamin D Lab Results  Component Value Date   VD25OH 43.13 07/17/2023   Stable, cont oral replacement

## 2023-07-20 NOTE — Assessment & Plan Note (Signed)
Mild to mod, for OTC allegra 180 every day prn,  to f/u any worsening symptoms or concerns

## 2023-07-28 ENCOUNTER — Encounter: Payer: Self-pay | Admitting: Internal Medicine

## 2023-07-28 ENCOUNTER — Ambulatory Visit: Payer: Medicare Other | Admitting: Internal Medicine

## 2023-07-28 VITALS — BP 141/75 | HR 63 | Temp 98.4°F | Resp 14 | Ht 73.0 in | Wt 258.0 lb

## 2023-07-28 DIAGNOSIS — Z09 Encounter for follow-up examination after completed treatment for conditions other than malignant neoplasm: Secondary | ICD-10-CM | POA: Diagnosis not present

## 2023-07-28 DIAGNOSIS — K222 Esophageal obstruction: Secondary | ICD-10-CM | POA: Diagnosis not present

## 2023-07-28 DIAGNOSIS — I2510 Atherosclerotic heart disease of native coronary artery without angina pectoris: Secondary | ICD-10-CM | POA: Diagnosis not present

## 2023-07-28 DIAGNOSIS — R1310 Dysphagia, unspecified: Secondary | ICD-10-CM | POA: Diagnosis not present

## 2023-07-28 DIAGNOSIS — K219 Gastro-esophageal reflux disease without esophagitis: Secondary | ICD-10-CM | POA: Diagnosis not present

## 2023-07-28 DIAGNOSIS — Z860101 Personal history of adenomatous and serrated colon polyps: Secondary | ICD-10-CM | POA: Diagnosis not present

## 2023-07-28 DIAGNOSIS — Z860100 Personal history of colon polyps, unspecified: Secondary | ICD-10-CM

## 2023-07-28 DIAGNOSIS — R1319 Other dysphagia: Secondary | ICD-10-CM

## 2023-07-28 DIAGNOSIS — D122 Benign neoplasm of ascending colon: Secondary | ICD-10-CM

## 2023-07-28 DIAGNOSIS — G4730 Sleep apnea, unspecified: Secondary | ICD-10-CM | POA: Diagnosis not present

## 2023-07-28 DIAGNOSIS — K317 Polyp of stomach and duodenum: Secondary | ICD-10-CM

## 2023-07-28 MED ORDER — SODIUM CHLORIDE 0.9 % IV SOLN: 500.0000 mL | Freq: Once | INTRAVENOUS | Status: DC

## 2023-07-28 NOTE — Op Note (Signed)
[facility] Patient Name: Allen Myers Procedure Date: 07/28/2023 2:41 PM MRN: [id number] Endoscopist: Wilhemina Bonito. Marina Goodell , MD, [id number] Age: 77 Referring MD:  Date of Birth: [birth year] Gender: Male Account #: [id number] Procedure:                Upper GI endoscopy with [facility] dilation of the                            esophagus. 35 [place] Indications:              Dysphagia, Therapeutic procedure, Esophageal reflux Medicines:                Monitored Anesthesia Care Procedure:                Pre-Anesthesia Assessment:                           - Prior to the procedure, a History and Physical                            was performed, and patient medications and                            allergies were reviewed. The patient's tolerance of                            previous anesthesia was also reviewed. The risks                            and benefits of the procedure and the sedation                            options and risks were discussed with the patient.                            All questions were answered, and informed consent                            was obtained. Prior Anticoagulants: The patient has                            taken Pletal (cilostazol), last dose was 1 day                            prior to procedure. ASA Grade Assessment: III - A                            patient with severe systemic disease. After                            reviewing the risks and benefits, the patient was                            deemed in satisfactory condition to undergo the  procedure.                           After obtaining informed consent, the endoscope was                            passed under direct vision. Throughout the                            procedure, the patient's blood pressure, pulse, and                            oxygen saturations were monitored continuously. The                            GIF [id number] #[id number] was  introduced through the                            mouth, and advanced to the second part of duodenum.                            The upper GI endoscopy was accomplished without                            difficulty. The patient tolerated the procedure                            well. Scope In: Scope Out: Findings:                 One benign-appearing, intrinsic moderate stenosis                            was found 40 cm from the incisors. This stenosis                            measured 1.5 cm (inner diameter). The scope was                            withdrawn. Dilation was performed with a Maloney                            dilator with no resistance at 54 Fr.                           The exam of the esophagus was otherwise normal.                           The stomach revealed proximal gastric polyps                            (previously benign hyperplastic/inflammatory                            biopsy) without change in size or appearance from  prior endoscopy in 2017. Stomach was otherwise                            normal save small hiatal hernia.                           The examined duodenum was normal.                           The cardia and gastric fundus were normal on                            retroflexion. Complications:            No immediate complications. Estimated Blood Loss:     Estimated blood loss: none. Impression:               - Benign-appearing esophageal stenosis. Dilated.                           -Benign gastric polyps.                           -Hiatal hernia.                           -Otherwise normal EGD. Recommendation:           - Patient has a contact number available for                            emergencies. The signs and symptoms of potential                            delayed complications were discussed with the                            patient. Return to normal activities tomorrow.                             Written discharge instructions were provided to the                            patient.                           -Post dilation diet.                           - Continue present medications. Wilhemina Bonito. Marina Goodell, MD 07/28/2023 3:50:32 PM This report has been signed electronically.

## 2023-07-28 NOTE — Progress Notes (Unsigned)
Expand All Collapse All HISTORY OF PRESENT ILLNESS:   Allen Myers is a 77 y.o. male with past medical history as listed below including coronary artery disease, morbid obesity, and sleep apnea.  He has been seen in this office for GERD complicated by peptic stricture and adenomatous colon polyps.  He presents today regarding surveillance colonoscopy and recurrent issues with dysphagia.   First, patient reports a 1 year history of worsening intermittent solid food dysphagia and occasional liquid dysphagia.  Does have a history of GERD with esophagitis for which she takes omeprazole 40 mg daily.  For the most part, reflux symptoms controlled.  His last upper endoscopy was performed February 02, 2016.  He was found to have distal esophagitis and a benign distal esophageal stricture which was dilated with 54 [place] Maloney dilator.  Also inflammatory appearing proximal gastric polyps which were biopsied and found to be inflammatory.  He states that esophageal dilation was quite helpful.  He is interested in repeat dilation.   Next, he has undergone multiple colonoscopies in the past and has a history of adenomatous colon polyps.  His last examination was 2019.  Follow-up in 5 years recommended.  He denies any active lower GI complaints.   Overall his health has been good.  He is active with pickleball.  He is on multiple agents for his diabetes.   Review of blood work from October 2023 shows normal comprehensive metabolic panel except for glucose of 140.   REVIEW OF SYSTEMS:   All non-GI ROS negative unless otherwise stated in the HPI except for back pain, fatigue       Past Medical History:  Diagnosis Date   Allergy     Arthritis     CAD (coronary artery disease)     Erectile dysfunction     History of colon polyps     History of diverticulitis     HYPERTENSION     Hypothyroidism     Kidney stones      "passed it"   Melanoma (HCC) 12/10/2019   Peripheral arterial disease (HCC)     SAH  (subarachnoid hemorrhage) (HCC)      Following syncopal episode   SDH (subdural hematoma) (HCC)      Following syncopal episode   Seizures (HCC)      08/2014 - controlled   Sleep apnea      "mild"  does not use CPAP   Sleep apnea in adult     Syncope and collapse     Type II diabetes mellitus (HCC)      type 2               Past Surgical History:  Procedure Laterality Date   COLONOSCOPY   10/14/2012    polyp   CORONARY ANGIOPLASTY WITH STENT PLACEMENT   05/11/2014    "1"   CRANIOTOMY Left 09/12/2014    Procedure: CRANIOTOMY HEMATOMA EVACUATION SUBDURAL;  Surgeon: Maeola Harman, MD;  Location: [facility];  Service: Neurosurgery;  Laterality: Left;   EXTRACORPOREAL SHOCK WAVE LITHOTRIPSY Left 01/08/2021    Procedure: EXTRACORPOREAL SHOCK WAVE LITHOTRIPSY (ESWL);  Surgeon: Heloise Purpura, MD;  Location: [facility];  Service: Urology;  Laterality: Left;   FRACTIONAL FLOW RESERVE WIRE   05/11/2014    Procedure: FRACTIONAL FLOW RESERVE WIRE;  Surgeon: Lesleigh Noe, MD;  Location: [facility] CATH LAB;  Service: Cardiovascular;;   LEFT HEART CATHETERIZATION WITH CORONARY ANGIOGRAM N/A 05/11/2014    Procedure: LEFT HEART  CATHETERIZATION WITH CORONARY ANGIOGRAM;  Surgeon: Lesleigh Noe, MD;  Location: [facility] CATH LAB;  Service: Cardiovascular;  Laterality: N/A;   POLYPECTOMY              Social History Allen Myers  reports that he quit smoking about 39 years ago. His smoking use included cigarettes. He started smoking about 69 years ago. He has a 30 pack-year smoking history. He has never used smokeless tobacco. He reports that he does not drink alcohol and does not use drugs.   family history includes AAA (abdominal aortic aneurysm) in his father; Aplastic anemia in his sister; Ataxia in his maternal grandmother; Dementia in his maternal grandmother; Heart disease in his father and mother; Lung cancer in his cousin and father; Prostate cancer in his brother.    Allergies  No Known Allergies         PHYSICAL EXAMINATION: Vital signs: BP 136/80   Pulse 60   Ht 6\' 1"  (1.854 m)   Wt 269 lb (122 kg)   BMI 35.49 kg/m   Constitutional: generally well-appearing, no acute distress Psychiatric: alert and oriented x3, cooperative Eyes: extraocular movements intact, anicteric, conjunctiva pink Mouth: oral pharynx moist, no lesions Neck: supple no lymphadenopathy Cardiovascular: heart regular rate and rhythm, no murmur Lungs: clear to auscultation bilaterally Abdomen: soft, obese, nontender, nondistended, no obvious ascites, no peritoneal signs, normal bowel sounds, no organomegaly Rectal: Deferred until colonoscopy Extremities: no clubbing or cyanosis.  Trace lower extremity edema bilaterally Skin: no lesions on visible extremities Neuro: No focal deficits.  Cranial nerves intact   ASSESSMENT:   1.  Recurrent dysphagia.  Likely secondary to known peptic stricture 2.  GERD with a history of nonerosive esophagitis, on omeprazole 40 mg 3.  History of adenomatous colon polyps.  Due for surveillance colonoscopy 4.  Morbid obesity 5.  Multiple medical problems.  Stable     PLAN:   1.  Reflux precautions with attention to weight loss 2.  Continue omeprazole daily 3.  Schedule upper endoscopy with esophageal dilation.  Patient is higher than average risk due to his age, body habitus, need to adjust medications preprocedure and comorbidities. 4.  Schedule surveillance colonoscopy.  Patient is high risk as above. 5.  Hold diabetic medications the day of the procedure to avoid unwanted hypoglycemia.  He tells me that he has not started and is not planning on starting Ozempic (which is on his medication list).  Okay to stay on aspirin and Pletal.  Recent complete H&P as above.  No interval change.

## 2023-07-28 NOTE — Progress Notes (Unsigned)
Pt's states no medical or surgical changes since previsit or office visit. 

## 2023-07-28 NOTE — Progress Notes (Unsigned)
Called to room to assist during endoscopic procedure.  Patient ID and intended procedure confirmed with present staff. Received instructions for my participation in the procedure from the performing physician.  

## 2023-07-28 NOTE — Patient Instructions (Addendum)
Handouts Provided:  Polyps, Diverticulosis, Hiatal Hernia and Post dilation diet.  REPEAT Colonoscopy is not recommended for surveillance.  YOU HAD AN ENDOSCOPIC PROCEDURE TODAY AT THE [facility]:   Refer to the procedure report that was given to you for any specific questions about what was found during the examination.  If the procedure report does not answer your questions, please call your gastroenterologist to clarify.  If you requested that your care partner not be given the details of your procedure findings, then the procedure report has been included in a sealed envelope for you to review at your convenience later.  YOU SHOULD EXPECT: Some feelings of bloating in the abdomen. Passage of more gas than usual.  Walking can help get rid of the air that was put into your GI tract during the procedure and reduce the bloating. If you had a lower endoscopy (such as a colonoscopy or flexible sigmoidoscopy) you may notice spotting of blood in your stool or on the toilet paper. If you underwent a bowel prep for your procedure, you may not have a normal bowel movement for a few days.  Please Note:  You might notice some irritation and congestion in your nose or some drainage.  This is from the oxygen used during your procedure.  There is no need for concern and it should clear up in a day or so.  SYMPTOMS TO REPORT IMMEDIATELY:  Following lower endoscopy (colonoscopy or flexible sigmoidoscopy):  Excessive amounts of blood in the stool  Significant tenderness or worsening of abdominal pains  Swelling of the abdomen that is new, acute  Fever of 100F or higher  Following upper endoscopy (EGD)  Vomiting of blood or coffee ground material  New chest pain or pain under the shoulder blades  Painful or persistently difficult swallowing  New shortness of breath  Fever of 100F or higher  Black, tarry-looking stools  For urgent or emergent issues, a gastroenterologist can be reached at  any hour by calling [phone]. Do not use MyChart messaging for urgent concerns.    DIET:  Please see Post dilation diet provided.  Drink plenty of fluids but you should avoid alcoholic beverages for 24 hours.  ACTIVITY:  You should plan to take it easy for the rest of today and you should NOT DRIVE or use heavy machinery until tomorrow (because of the sedation medicines used during the test).    FOLLOW UP: Our staff will call the number listed on your records the next business day following your procedure.  We will call around 7:15- 8:00 am to check on you and address any questions or concerns that you may have regarding the information given to you following your procedure. If we do not reach you, we will leave a message.     If any biopsies were taken you will be contacted by phone or by letter within the next 1-3 weeks.  Please call us at [phone] if you have not heard about the biopsies in 3 weeks.    SIGNATURES/CONFIDENTIALITY: You and/or your care partner have signed paperwork which will be entered into your electronic medical record.  These signatures attest to the fact that that the information above on your After Visit Summary has been reviewed and is understood.  Full responsibility of the confidentiality of this discharge information lies with you and/or your care-partner.

## 2023-07-28 NOTE — Progress Notes (Unsigned)
Vss nad trans to pacu 

## 2023-07-28 NOTE — Op Note (Signed)
[facility] Patient Name: Allen Myers Procedure Date: 07/28/2023 2:52 PM MRN: [id number] Endoscopist: Wilhemina Bonito. Marina Goodell , MD, [id number] Age: 77 Referring MD:  Date of Birth: October 31, 1945 Gender: Male Account #: [id number] Procedure:                Colonoscopy with cold snare polypectomy x 1 Indications:              High risk colon cancer surveillance: Personal                            history of non-advanced adenomas. Previous                            examinations 2003, 2008, 2014, 2019 Medicines:                Monitored Anesthesia Care Procedure:                Pre-Anesthesia Assessment:                           - Prior to the procedure, a History and Physical                            was performed, and patient medications and                            allergies were reviewed. The patient's tolerance of                            previous anesthesia was also reviewed. The risks                            and benefits of the procedure and the sedation                            options and risks were discussed with the patient.                            All questions were answered, and informed consent                            was obtained. Prior Anticoagulants: The patient has                            taken Pletal (cilostazol), last dose was 1 day                            prior to procedure. ASA Grade Assessment: III - A                            patient with severe systemic disease. After                            reviewing the risks and benefits, the patient was  deemed in satisfactory condition to undergo the                            procedure.                           After obtaining informed consent, the colonoscope                            was passed under direct vision. Throughout the                            procedure, the patient's blood pressure, pulse, and                            oxygen saturations were  monitored continuously. The                            CF [id number] #[id number] was introduced through the anus                            and advanced to the the cecum, identified by                            appendiceal orifice and ileocecal valve. The                            ileocecal valve, appendiceal orifice, and rectum                            were photographed. The quality of the bowel                            preparation was excellent. The colonoscopy was                            performed without difficulty. The patient tolerated                            the procedure well. The bowel preparation used was                            SUPREP via split dose instruction. Scope In: 3:14:39 PM Scope Out: 3:31:46 PM Scope Withdrawal Time: 0 hours 10 minutes 24 seconds  Total Procedure Duration: 0 hours 17 minutes 7 seconds  Findings:                 A 4 mm polyp was found in the ascending colon. The                            polyp was removed with a cold snare. Resection and                            retrieval were complete.  Many diverticula were found in the left colon.                           The exam was otherwise without abnormality on                            direct and retroflexion views. Complications:            No immediate complications. Estimated blood loss:                            None. Estimated Blood Loss:     Estimated blood loss: none. Impression:               - One 4 mm polyp in the ascending colon, removed                            with a cold snare. Resected and retrieved.                           - Diverticulosis in the left colon.                           - The examination was otherwise normal on direct                            and retroflexion views. Recommendation:           - Repeat colonoscopy is not recommended for                            surveillance.                           - Patient has a contact number  available for                            emergencies. The signs and symptoms of potential                            delayed complications were discussed with the                            patient. Return to normal activities tomorrow.                            Written discharge instructions were provided to the                            patient.                           - Resume previous diet.                           - Continue present medications.                           -  Await pathology results. Wilhemina Bonito. Marina Goodell, MD 07/28/2023 3:37:08 PM This report has been signed electronically.

## 2023-07-29 ENCOUNTER — Telehealth: Payer: Self-pay

## 2023-07-29 NOTE — Telephone Encounter (Signed)
  Follow up Call-     07/28/2023    2:55 PM  Call back number  Post procedure Call Back phone  # [phone]  Permission to leave phone message Yes     Patient questions:  Do you have a fever, pain , or abdominal swelling? No. Pain Score  0 *  Have you tolerated food without any problems? Yes.    Have you been able to return to your normal activities? Yes.    Do you have any questions about your discharge instructions: Diet   No. Medications  No. Follow up visit  No.  Do you have questions or concerns about your Care? No.  Actions: * If pain score is 4 or above: No action needed, pain <4.

## 2023-07-31 LAB — SURGICAL PATHOLOGY

## 2023-08-04 ENCOUNTER — Encounter: Payer: Self-pay | Admitting: Internal Medicine

## 2023-08-21 ENCOUNTER — Other Ambulatory Visit: Payer: Self-pay | Admitting: Internal Medicine

## 2023-08-21 ENCOUNTER — Other Ambulatory Visit: Payer: Self-pay

## 2023-08-26 ENCOUNTER — Ambulatory Visit: Payer: Medicare Other | Attending: Physician Assistant | Admitting: Physician Assistant

## 2023-08-26 ENCOUNTER — Encounter: Payer: Self-pay | Admitting: Physician Assistant

## 2023-08-26 VITALS — BP 116/68 | HR 60 | Ht 72.0 in | Wt 264.0 lb

## 2023-08-26 DIAGNOSIS — I739 Peripheral vascular disease, unspecified: Secondary | ICD-10-CM | POA: Insufficient documentation

## 2023-08-26 DIAGNOSIS — I714 Abdominal aortic aneurysm, without rupture, unspecified: Secondary | ICD-10-CM | POA: Insufficient documentation

## 2023-08-26 DIAGNOSIS — E785 Hyperlipidemia, unspecified: Secondary | ICD-10-CM | POA: Diagnosis not present

## 2023-08-26 DIAGNOSIS — E119 Type 2 diabetes mellitus without complications: Secondary | ICD-10-CM | POA: Diagnosis not present

## 2023-08-26 DIAGNOSIS — I2510 Atherosclerotic heart disease of native coronary artery without angina pectoris: Secondary | ICD-10-CM | POA: Insufficient documentation

## 2023-08-26 DIAGNOSIS — I119 Hypertensive heart disease without heart failure: Secondary | ICD-10-CM | POA: Diagnosis not present

## 2023-08-26 NOTE — Patient Instructions (Signed)
Medication Instructions:  NO CHANGES *If you need a refill on your cardiac medications before your next appointment, please call your pharmacy*   Lab Work: NO LABS If you have labs (blood work) drawn today and your tests are completely normal, you will receive your results only by: MyChart Message (if you have MyChart) OR A paper copy in the mail If you have any lab test that is abnormal or we need to change your treatment, we will call you to review the results.   Testing/Procedures: NO TESTING   Follow-Up: At [facility], you and your health needs are our priority.  As part of our continuing mission to provide you with exceptional heart care, we have created designated Provider Care Teams.  These Care Teams include your primary Cardiologist (physician) and Advanced Practice Providers (APPs -  Physician Assistants and Nurse Practitioners) who all work together to provide you with the care you need, when you need it.   Your next appointment:   6 month(s)  Provider:   Olga Millers, MD
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
## 2023-09-10 ENCOUNTER — Other Ambulatory Visit: Payer: Self-pay

## 2023-09-10 ENCOUNTER — Other Ambulatory Visit: Payer: Self-pay | Admitting: Internal Medicine

## 2023-09-14 ENCOUNTER — Other Ambulatory Visit: Payer: Self-pay | Admitting: Internal Medicine

## 2023-09-15 ENCOUNTER — Other Ambulatory Visit: Payer: Self-pay

## 2023-10-02 ENCOUNTER — Other Ambulatory Visit: Payer: Self-pay

## 2023-10-02 ENCOUNTER — Other Ambulatory Visit: Payer: Self-pay | Admitting: Internal Medicine

## 2023-10-14 ENCOUNTER — Other Ambulatory Visit: Payer: Self-pay

## 2023-10-14 ENCOUNTER — Other Ambulatory Visit: Payer: Self-pay | Admitting: Internal Medicine

## 2023-10-23 ENCOUNTER — Encounter: Payer: Self-pay | Admitting: Internal Medicine

## 2023-10-27 MED ORDER — OZEMPIC (0.25 OR 0.5 MG/DOSE) 2 MG/1.5ML ~~LOC~~ SOPN: 0.5000 mg | PEN_INJECTOR | SUBCUTANEOUS | 3 refills | Status: DC

## 2023-10-27 NOTE — Addendum Note (Signed)
 Addended by: Corwin Levins on: 10/27/2023 02:45 PM   Modules accepted: Orders

## 2023-11-06 ENCOUNTER — Other Ambulatory Visit: Payer: Self-pay | Admitting: Internal Medicine

## 2023-11-06 ENCOUNTER — Other Ambulatory Visit: Payer: Self-pay

## 2023-11-12 ENCOUNTER — Other Ambulatory Visit: Payer: Self-pay | Admitting: Internal Medicine

## 2023-11-13 NOTE — Telephone Encounter (Signed)
Ok to let pt know - we changed the tradjenta to [place] due to insurance  This is roughly the same treatment, so he should do ok with this instead, thanks

## 2023-11-17 ENCOUNTER — Ambulatory Visit (INDEPENDENT_AMBULATORY_CARE_PROVIDER_SITE_OTHER): Payer: Medicare Other | Admitting: Internal Medicine

## 2023-11-17 ENCOUNTER — Encounter: Payer: Self-pay | Admitting: Internal Medicine

## 2023-11-17 VITALS — BP 126/62 | HR 88 | Temp 97.8°F | Ht 72.0 in | Wt 258.0 lb

## 2023-11-17 DIAGNOSIS — R062 Wheezing: Secondary | ICD-10-CM | POA: Diagnosis not present

## 2023-11-17 DIAGNOSIS — J301 Allergic rhinitis due to pollen: Secondary | ICD-10-CM | POA: Diagnosis not present

## 2023-11-17 DIAGNOSIS — J101 Influenza due to other identified influenza virus with other respiratory manifestations: Secondary | ICD-10-CM | POA: Diagnosis not present

## 2023-11-17 DIAGNOSIS — E559 Vitamin D deficiency, unspecified: Secondary | ICD-10-CM

## 2023-11-17 DIAGNOSIS — R051 Acute cough: Secondary | ICD-10-CM | POA: Diagnosis not present

## 2023-11-17 LAB — POCT INFLUENZA A/B
Influenza A, POC: POSITIVE — AB
Influenza B, POC: NEGATIVE

## 2023-11-17 LAB — POCT RESPIRATORY SYNCYTIAL VIRUS: RSV Rapid Ag: NEGATIVE

## 2023-11-17 LAB — POC COVID19 BINAXNOW: SARS Coronavirus 2 Ag: NEGATIVE

## 2023-11-17 MED ORDER — METHYLPREDNISOLONE ACETATE 80 MG/ML IJ SUSP: 80.0000 mg | Freq: Once | INTRAMUSCULAR | Status: DC

## 2023-11-17 MED ORDER — PREDNISONE 10 MG PO TABS: ORAL_TABLET | ORAL | 0 refills | Status: DC

## 2023-11-17 MED ORDER — HYDROCODONE BIT-HOMATROP MBR 5-1.5 MG/5ML PO SOLN: 5.0000 mL | Freq: Four times a day (QID) | ORAL | 0 refills | Status: AC | PRN

## 2023-11-17 MED ORDER — ALBUTEROL SULFATE HFA 108 (90 BASE) MCG/ACT IN AERS
2.0000 | INHALATION_SPRAY | Freq: Four times a day (QID) | RESPIRATORY_TRACT | 1 refills | Status: AC | PRN
Start: 2023-11-17 — End: ?

## 2023-11-17 MED ORDER — OSELTAMIVIR PHOSPHATE 75 MG PO CAPS: 75.0000 mg | ORAL_CAPSULE | Freq: Two times a day (BID) | ORAL | 0 refills | Status: AC

## 2023-11-17 MED ORDER — METHYLPREDNISOLONE ACETATE 40 MG/ML IJ SUSP
40.0000 mg | Freq: Once | INTRAMUSCULAR | Status: AC
  Administered 2023-11-17: 40 mg via INTRAMUSCULAR

## 2023-11-17 NOTE — Patient Instructions (Addendum)
Your test was Positive for Flu A today  You had the steroid shot today  Please take all new medication as prescribed --the antibiotic, cough medicine, prednisone, and inhaler as needed  Please continue all other medications as before, and refills have been done if requested.  Please have the pharmacy call with any other refills you may need.  Please keep your appointments with your specialists as you may have planned  Please make an Appointment to return in 4 months, or sooner if needed

## 2023-11-17 NOTE — Progress Notes (Unsigned)
Patient ID: Allen Myers, male   DOB: August 01, 1946, 78 y.o.   MRN: [id number]        Chief Complaint: follow up lfu like symptoms, wheezing sob, low vit d, allergies       HPI:  Allen Myers is a 78 y.o. male here with c/o flu like symptoms of HA, feverish,  cough and congestion, maliase for 3 days after wife ill for the past wk.  Pt denies chest pain, orthopnea, PND, increased LE swelling, palpitations, dizziness or syncope, but has mild wheezing as well.   Pt denies polydipsia, polyuria, or new focal neuro s/s.    Pt denies recent wt loss, night sweats, loss of appetite, or other constitutional symptoms        Wt Readings from Last 3 Encounters:  11/17/23 258 lb (117 kg)  08/26/23 264 lb (119.7 kg)  07/28/23 258 lb (117 kg)   BP Readings from Last 3 Encounters:  11/17/23 126/62  08/26/23 116/68  07/28/23 (!) 141/75         Past Medical History:  Diagnosis Date   Allergy    Arthritis    CAD (coronary artery disease)    Erectile dysfunction    History of colon polyps    History of diverticulitis    HYPERTENSION    Hypothyroidism    Kidney stones    "passed it"   Melanoma (HCC) 12/10/2019   Peripheral arterial disease (HCC)    SAH (subarachnoid hemorrhage) (HCC)    Following syncopal episode   SDH (subdural hematoma) (HCC)    Following syncopal episode   Seizures (HCC)    08/2014 - controlled   Sleep apnea    "mild"  does not use CPAP   Sleep apnea in adult    Syncope and collapse    Type II diabetes mellitus (HCC)    type 2   Past Surgical History:  Procedure Laterality Date   COLONOSCOPY  10/14/2012   polyp   CORONARY ANGIOPLASTY WITH STENT PLACEMENT  05/11/2014   "1"   CRANIOTOMY Left 09/12/2014   Procedure: CRANIOTOMY HEMATOMA EVACUATION SUBDURAL;  Surgeon: Maeola Harman, MD;  Location: [facility];  Service: Neurosurgery;  Laterality: Left;   EXTRACORPOREAL SHOCK WAVE LITHOTRIPSY Left 01/08/2021   Procedure: EXTRACORPOREAL SHOCK WAVE LITHOTRIPSY (ESWL);   Surgeon: Heloise Purpura, MD;  Location: [facility];  Service: Urology;  Laterality: Left;   FRACTIONAL FLOW RESERVE WIRE  05/11/2014   Procedure: FRACTIONAL FLOW RESERVE WIRE;  Surgeon: Lesleigh Noe, MD;  Location: [facility] CATH LAB;  Service: Cardiovascular;;   LEFT HEART CATHETERIZATION WITH CORONARY ANGIOGRAM N/A 05/11/2014   Procedure: LEFT HEART CATHETERIZATION WITH CORONARY ANGIOGRAM;  Surgeon: Lesleigh Noe, MD;  Location: [facility] C[facility] LAB;  Service: Cardiovascular;  Laterality: N/A;   POLYPECTOMY      reports that he quit smoking about 40 years ago. His smoking use included cigarettes. He started smoking about 70 years ago. He has a 30 pack-year smoking history. He has never used smokeless tobacco. He reports that he does not drink alcohol and does not use drugs. family history includes AAA (abdominal aortic aneurysm) in his father; Aplastic anemia in his sister; Ataxia in his maternal grandmother; Dementia in his maternal grandmother; Heart disease in his father and mother; Lung cancer in his cousin and father; Prostate cancer in his brother. No Known Allergies Current Outpatient Medications on File Prior to Visit  Medication Sig Dispense Refill   Ascorbic Acid (VITAMIN C  PO) Take 1 tablet by mouth.     aspirin EC 81 MG tablet Take 81 mg by mouth daily.     atorvastatin (LIPITOR) 80 MG tablet TAKE 1 TABLET DAILY 90 tablet 2   carvedilol (COREG) 3.125 MG tablet TAKE 1 TABLET TWICE A DAY  WITH MEALS 180 tablet 3   cilostazol (PLETAL) 100 MG tablet TAKE 1 TABLET TWICE A DAY 180 tablet 3   levETIRAcetam (KEPPRA) 1000 MG tablet Take 1 tablet (1,000 mg total) by mouth 2 (two) times daily. 180 tablet 4   levothyroxine (SYNTHROID) 137 MCG tablet TAKE 1 TABLET DAILY BEFORE BREAKFAST 90 tablet 3   lisinopril (ZESTRIL) 2.5 MG tablet TAKE 1 TABLET DAILY 90 tablet 3   meloxicam (MOBIC) 15 MG tablet Take 15 mg by mouth daily.     metFORMIN (GLUCOPHAGE) 1000 MG tablet TAKE 1 TABLET TWICE  DAILY  WITH MEALS 180 tablet 1   Na Sulfate-K Sulfate-Mg Sulf 17.5-3.13-1.6 GM/177ML SOLN Use as directed; may use generic; goodrx card if insurance will not cover generic 354 mL 0   omeprazole (PRILOSEC) 40 MG capsule TAKE 1 CAPSULE DAILY 90 capsule 3   pioglitazone (ACTOS) 45 MG tablet TAKE 1 TABLET DAILY 90 tablet 3   Semaglutide,0.25 or 0.5MG /DOS, (OZEMPIC, 0.25 OR 0.5 MG/DOSE,) 2 MG/1.5ML SOPN Inject 0.5 mg into the skin once a week. 6 mL 3   sitaGLIPtin (JANUVIA) 100 MG tablet Take 1 tablet (100 mg total) by mouth daily. 90 tablet 3   tirzepatide (MOUNJARO) 2.5 MG/0.5ML Pen Inject 2.5 mg into the skin once a week. 2 mL 11   valACYclovir (VALTREX) 1000 MG tablet Take 1 tablet (1,000 mg total) by mouth 2 (two) times daily as needed. 20 tablet 5   vitamin B-12 (CYANOCOBALAMIN) 1000 MCG tablet Take 1 tablet (1,000 mcg total) by mouth daily. 90 tablet 1   No current facility-administered medications on file prior to visit.        ROS:  All others reviewed and negative.  Objective        PE:  BP 126/62 (BP Location: Left Arm, Patient Position: Sitting, Cuff Size: Large)   Pulse 88   Temp 97.8 F (36.6 C) (Temporal)   Ht 6' (1.829 m)   Wt 258 lb (117 kg)   SpO2 94%   BMI 34.99 kg/m                 Constitutional: Pt appears in NAD               HENT: Head: NCAT.                Right Ear: External ear normal.                 Left Ear: External ear normal.                Eyes: . Pupils are equal, round, and reactive to light. Conjunctivae and EOM are normal               Nose: without d/c or deformity               Neck: Neck supple. Gross normal ROM               Cardiovascular: Normal rate and regular rhythm.                 Pulmonary/Chest: Effort normal and breath sounds without rales or wheezing.  Abd:  Soft, NT, ND, + BS, no organomegaly               Neurological: Pt is alert. At baseline orientation, motor grossly intact               Skin: Skin is warm. No  rashes, no other new lesions, LE edema - ***               Psychiatric: Pt behavior is normal without agitation   Micro: none  Cardiac tracings I have personally interpreted today:  none  Pertinent Radiological findings (summarize): none   Lab Results  Component Value Date   WBC 7.5 07/17/2023   HGB 14.7 07/17/2023   HCT 45.5 07/17/2023   PLT 136.0 (L) 07/17/2023   GLUCOSE 147 (H) 07/17/2023   CHOL 111 07/17/2023   TRIG 78.0 07/17/2023   HDL 49.40 07/17/2023   LDLDIRECT 85.7 06/11/2013   LDLCALC 46 07/17/2023   ALT 21 07/17/2023   AST 20 07/17/2023   NA 137 07/17/2023   K 4.8 07/17/2023   CL 101 07/17/2023   CREATININE 0.90 07/17/2023   BUN 16 07/17/2023   CO2 28 07/17/2023   TSH 0.59 07/17/2023   PSA 2.54 07/17/2023   INR 1.07 09/06/2014   HGBA1C 7.4 (H) 07/17/2023   MICROALBUR 1.2 07/17/2023   POCT - FLU A - Positive                COVID - neg                  RSV - neg  Assessment/Plan:  Allen Myers is a 78 y.o. White or Caucasian [1] male with  has a past medical history of Allergy, Arthritis, CAD (coronary artery disease), Erectile dysfunction, History of colon polyps, History of diverticulitis, HYPERTENSION, Hypothyroidism, Kidney stones, Melanoma (HCC) (12/10/2019), Peripheral arterial disease (HCC), SAH (subarachnoid hemorrhage) (HCC), SDH (subdural hematoma) (HCC), Seizures (HCC), Sleep apnea, Sleep apnea in adult, Syncope and collapse, and Type II diabetes mellitus (HCC).  No problem-specific Assessment & Plan notes found for this encounter.  Followup: No follow-ups on file.  Oliver Barre, MD 11/17/2023 9:29 AM [facility] - [facility]

## 2023-11-18 ENCOUNTER — Encounter: Payer: Self-pay | Admitting: Internal Medicine

## 2023-11-18 NOTE — Assessment & Plan Note (Signed)
Mild to mod, also for otc allerga and/ or nasacort asd,  to f/u any worsening symptoms or concerns

## 2023-11-18 NOTE — Assessment & Plan Note (Addendum)
Mild to mod, for prednisone taper,, inhaler prn, to f/u any worsening symptoms or concerns

## 2023-11-18 NOTE — Assessment & Plan Note (Signed)
Mild to mod, for antibx course tamiflu 75 bid x 5 days,  to f/u any worsening symptoms or concerns

## 2023-11-18 NOTE — Assessment & Plan Note (Signed)
Last vitamin D Lab Results  Component Value Date   VD25OH 43.13 07/17/2023   Stable, cont oral replacement

## 2023-11-19 ENCOUNTER — Encounter: Payer: Self-pay | Admitting: Internal Medicine

## 2023-12-10 DIAGNOSIS — Z85820 Personal history of malignant melanoma of skin: Secondary | ICD-10-CM | POA: Diagnosis not present

## 2023-12-10 DIAGNOSIS — L814 Other melanin hyperpigmentation: Secondary | ICD-10-CM | POA: Diagnosis not present

## 2023-12-10 DIAGNOSIS — D225 Melanocytic nevi of trunk: Secondary | ICD-10-CM | POA: Diagnosis not present

## 2023-12-10 DIAGNOSIS — D1801 Hemangioma of skin and subcutaneous tissue: Secondary | ICD-10-CM | POA: Diagnosis not present

## 2023-12-10 DIAGNOSIS — Z85828 Personal history of other malignant neoplasm of skin: Secondary | ICD-10-CM | POA: Diagnosis not present

## 2023-12-10 DIAGNOSIS — L821 Other seborrheic keratosis: Secondary | ICD-10-CM | POA: Diagnosis not present

## 2023-12-10 DIAGNOSIS — D692 Other nonthrombocytopenic purpura: Secondary | ICD-10-CM | POA: Diagnosis not present

## 2023-12-10 DIAGNOSIS — L570 Actinic keratosis: Secondary | ICD-10-CM | POA: Diagnosis not present

## 2023-12-16 ENCOUNTER — Other Ambulatory Visit: Payer: Self-pay | Admitting: *Deleted

## 2023-12-16 DIAGNOSIS — I714 Abdominal aortic aneurysm, without rupture, unspecified: Secondary | ICD-10-CM

## 2024-01-09 ENCOUNTER — Ambulatory Visit (HOSPITAL_COMMUNITY)
Admission: RE | Admit: 2024-01-09 | Discharge: 2024-01-09 | Disposition: A | Discharge status: Home / Self Care | Source: Ambulatory Visit | Attending: Cardiovascular Disease | Admitting: Cardiovascular Disease

## 2024-01-09 ENCOUNTER — Other Ambulatory Visit: Payer: Self-pay

## 2024-01-09 DIAGNOSIS — I714 Abdominal aortic aneurysm, without rupture, unspecified: Secondary | ICD-10-CM

## 2024-01-09 DIAGNOSIS — I7141 Pararenal abdominal aortic aneurysm, without rupture: Secondary | ICD-10-CM | POA: Diagnosis not present

## 2024-01-15 ENCOUNTER — Encounter: Payer: Self-pay | Admitting: Internal Medicine

## 2024-01-15 ENCOUNTER — Ambulatory Visit: Payer: Medicare Other | Admitting: Internal Medicine

## 2024-01-15 ENCOUNTER — Other Ambulatory Visit: Payer: Self-pay | Admitting: Internal Medicine

## 2024-01-15 VITALS — BP 122/60 | HR 76 | Temp 99.5°F | Ht 72.0 in | Wt 244.0 lb

## 2024-01-15 DIAGNOSIS — Z7984 Long term (current) use of oral hypoglycemic drugs: Secondary | ICD-10-CM

## 2024-01-15 DIAGNOSIS — E119 Type 2 diabetes mellitus without complications: Secondary | ICD-10-CM

## 2024-01-15 DIAGNOSIS — E7800 Pure hypercholesterolemia, unspecified: Secondary | ICD-10-CM | POA: Diagnosis not present

## 2024-01-15 DIAGNOSIS — E538 Deficiency of other specified B group vitamins: Secondary | ICD-10-CM

## 2024-01-15 DIAGNOSIS — I10 Essential (primary) hypertension: Secondary | ICD-10-CM | POA: Diagnosis not present

## 2024-01-15 DIAGNOSIS — Z7985 Long-term (current) use of injectable non-insulin antidiabetic drugs: Secondary | ICD-10-CM

## 2024-01-15 DIAGNOSIS — E559 Vitamin D deficiency, unspecified: Secondary | ICD-10-CM | POA: Diagnosis not present

## 2024-01-15 LAB — CBC WITH DIFFERENTIAL/PLATELET
Basophils Absolute: 0 10*3/uL (ref 0.0–0.1)
Basophils Relative: 0.7 % (ref 0.0–3.0)
Eosinophils Absolute: 0.1 10*3/uL (ref 0.0–0.7)
Eosinophils Relative: 2 % (ref 0.0–5.0)
HCT: 44.1 % (ref 39.0–52.0)
Hemoglobin: 14.6 g/dL (ref 13.0–17.0)
Lymphocytes Relative: 19.4 % (ref 12.0–46.0)
Lymphs Abs: 1.4 10*3/uL (ref 0.7–4.0)
MCHC: 33.1 g/dL (ref 30.0–36.0)
MCV: 93.8 fl (ref 78.0–100.0)
Monocytes Absolute: 0.7 10*3/uL (ref 0.1–1.0)
Monocytes Relative: 9 % (ref 3.0–12.0)
Neutro Abs: 5.1 10*3/uL (ref 1.4–7.7)
Neutrophils Relative %: 68.9 % (ref 43.0–77.0)
Platelets: 150 10*3/uL (ref 150.0–400.0)
RBC: 4.7 Mil/uL (ref 4.22–5.81)
RDW: 16.2 % — ABNORMAL HIGH (ref 11.5–15.5)
WBC: 7.3 10*3/uL (ref 4.0–10.5)

## 2024-01-15 LAB — URINALYSIS, ROUTINE W REFLEX MICROSCOPIC
Bilirubin Urine: NEGATIVE
Hgb urine dipstick: NEGATIVE
Leukocytes,Ua: NEGATIVE
Nitrite: NEGATIVE
Specific Gravity, Urine: >=1.03 — AB (ref 1.000–1.030)
Total Protein, Urine: NEGATIVE
Urine Glucose: NEGATIVE
Urobilinogen, UA: 1 (ref 0.0–1.0)
pH: 6 (ref 5.0–8.0)

## 2024-01-15 LAB — HEPATIC FUNCTION PANEL
ALT: 20 U/L (ref 0–53)
AST: 17 U/L (ref 0–37)
Albumin: 4.3 g/dL (ref 3.5–5.2)
Alkaline Phosphatase: 92 U/L (ref 39–117)
Bilirubin, Direct: 0.2 mg/dL (ref 0.0–0.3)
Total Bilirubin: 0.7 mg/dL (ref 0.2–1.2)
Total Protein: 7.1 g/dL (ref 6.0–8.3)

## 2024-01-15 LAB — LIPID PANEL
Cholesterol: 91 mg/dL (ref 0–200)
HDL: 41.6 mg/dL (ref >39.00)
LDL Cholesterol: 37 mg/dL (ref 0–99)
NonHDL: 49.63
Total CHOL/HDL Ratio: 2
Triglycerides: 65 mg/dL (ref 0.0–149.0)
VLDL: 13 mg/dL (ref 0.0–40.0)

## 2024-01-15 LAB — MICROALBUMIN / CREATININE URINE RATIO
Creatinine,U: 203.6 mg/dL
Microalb Creat Ratio: 10.6 mg/g (ref 0.0–30.0)
Microalb, Ur: 2.2 mg/dL — ABNORMAL HIGH (ref 0.0–1.9)

## 2024-01-15 LAB — BASIC METABOLIC PANEL WITH GFR
BUN: 20 mg/dL (ref 6–23)
CO2: 27 meq/L (ref 19–32)
Calcium: 9.3 mg/dL (ref 8.4–10.5)
Chloride: 103 meq/L (ref 96–112)
Creatinine, Ser: 1.08 mg/dL (ref 0.40–1.50)
GFR: 66.1 mL/min (ref >60.00)
Glucose, Bld: 129 mg/dL — ABNORMAL HIGH (ref 70–99)
Potassium: 4.6 meq/L (ref 3.5–5.1)
Sodium: 138 meq/L (ref 135–145)

## 2024-01-15 LAB — HEMOGLOBIN A1C: Hgb A1c MFr Bld: 6.7 % — ABNORMAL HIGH (ref 4.6–6.5)

## 2024-01-15 LAB — TSH: TSH: 0.18 u[IU]/mL — ABNORMAL LOW (ref 0.35–5.50)

## 2024-01-15 MED ORDER — LEVOTHYROXINE SODIUM 125 MCG PO TABS: 125.0000 ug | ORAL_TABLET | Freq: Every day | ORAL | 3 refills | Status: DC

## 2024-01-15 MED ORDER — SEMAGLUTIDE (1 MG/DOSE) 4 MG/3ML ~~LOC~~ SOPN: 1.0000 mg | PEN_INJECTOR | SUBCUTANEOUS | 3 refills | Status: DC

## 2024-01-15 NOTE — Progress Notes (Signed)
 Patient ID: Allen Myers, male   DOB: Jan 23, 1946, 78 y.o.   MRN: [id number]         Chief Complaint:: yearly exam       HPI:  Allen Myers is a 78 y.o. male here overall doing ok,  Pt denies chest pain, increased sob or doe, wheezing, orthopnea, PND, increased LE swelling, palpitations, dizziness or syncope.   Pt denies polydipsia, polyuria, or new focal neuro s/s.    Pt denies fever, wt loss, night sweats, loss of appetite, or other constitutional symptoms  tolerating meds, Lost 20 lbs with ozempic start last visit.   Wt Readings from Last 3 Encounters:  01/15/24 244 lb (110.7 kg)  11/17/23 258 lb (117 kg)  08/26/23 264 lb (119.7 kg)   BP Readings from Last 3 Encounters:  01/15/24 122/60  11/17/23 126/62  08/26/23 116/68   Immunization History  Administered Date(s) Administered   Fluad Quad(high Dose 65+) 06/09/2019, 08/07/2022   Influenza Whole 12/11/2009, 06/19/2011   Influenza, High Dose Seasonal PF 08/07/2015, 07/24/2016, 07/10/2017, 06/16/2018   Influenza,inj,Quad PF,6+ Mos 06/15/2013, 07/26/2014   PFIZER(Purple Top)SARS-COV-2 Vaccination 12/30/2019, 01/24/2020, 07/29/2020   Pneumococcal Conjugate-13 08/10/2014   Pneumococcal Polysaccharide-23 05/16/2008, 08/23/2015   Rsv, Mab, Judi Cong, 0.5 Ml, Neonate To 24 Mos(Beyfortus) 05/20/2023   Td 02/20/1996, 11/21/2008, 10/10/2010   Tdap 03/06/2023   Zoster Recombinant(Shingrix) 03/06/2023, 05/20/2023   Zoster, Live 05/16/2008   Health Maintenance Due  Topic Date Due   Medicare Annual Wellness (AWV)  03/16/2024      Past Medical History:  Diagnosis Date   Allergy    Arthritis    CAD (coronary artery disease)    Erectile dysfunction    History of colon polyps    History of diverticulitis    HYPERTENSION    Hypothyroidism    Kidney stones    "passed it"   Melanoma (HCC) 12/10/2019   Peripheral arterial disease (HCC)    SAH (subarachnoid hemorrhage) (HCC)    Following syncopal episode   SDH (subdural  hematoma) (HCC)    Following syncopal episode   Seizures (HCC)    08/2014 - controlled   Sleep apnea    "mild"  does not use CPAP   Sleep apnea in adult    Syncope and collapse    Type II diabetes mellitus (HCC)    type 2   Past Surgical History:  Procedure Laterality Date   COLONOSCOPY  10/14/2012   polyp   CORONARY ANGIOPLASTY WITH STENT PLACEMENT  05/11/2014   "1"   CRANIOTOMY Left 09/12/2014   Procedure: CRANIOTOMY HEMATOMA EVACUATION SUBDURAL;  Surgeon: Maeola Harman, MD;  Location: [facility];  Service: Neurosurgery;  Laterality: Left;   EXTRACORPOREAL SHOCK WAVE LITHOTRIPSY Left 01/08/2021   Procedure: EXTRACORPOREAL SHOCK WAVE LITHOTRIPSY (ESWL);  Surgeon: Heloise Purpura, MD;  Location: [facility];  Service: Urology;  Laterality: Left;   FRACTIONAL FLOW RESERVE WIRE  05/11/2014   Procedure: FRACTIONAL FLOW RESERVE WIRE;  Surgeon: Lesleigh Noe, MD;  [facility]: [facility] CATH LAB;  Service: Cardiovascular;;   LEFT HEART CATHETERIZATION WITH CORONARY ANGIOGRAM N/A 05/11/2014   Procedure: LEFT HEART CATHETERIZATION WITH CORONARY ANGIOGRAM;  Surgeon: Lesleigh Noe, MD;  Location: [facility] CATH LAB;  Service: Cardiovascular;  Laterality: N/A;   POLYPECTOMY      reports that he quit smoking about 40 years ago. His smoking use included cigarettes. He started smoking about 70 years ago. He has a 30 pack-year smoking history. He has never  used smokeless tobacco. He reports that he does not drink alcohol and does not use drugs. family history includes AAA (abdominal aortic aneurysm) in his father; Aplastic anemia in his sister; Ataxia in his maternal grandmother; Dementia in his maternal grandmother; Heart disease in his father and mother; Lung cancer in his cousin and father; Prostate cancer in his brother. No Known Allergies Current Outpatient Medications on File Prior to Visit  Medication Sig Dispense Refill   albuterol (VENTOLIN HFA) 108 (90 Base) MCG/ACT inhaler Inhale 2  puffs into the lungs every 6 (six) hours as needed for wheezing or shortness of breath. 8 g 1   Ascorbic Acid (VITAMIN C PO) Take 1 tablet by mouth.     aspirin EC 81 MG tablet Take 81 mg by mouth daily.     atorvastatin (LIPITOR) 80 MG tablet TAKE 1 TABLET DAILY 90 tablet 2   carvedilol (COREG) 3.125 MG tablet TAKE 1 TABLET TWICE A DAY  WITH MEALS 180 tablet 3   cilostazol (PLETAL) 100 MG tablet TAKE 1 TABLET TWICE A DAY 180 tablet 3   levETIRAcetam (KEPPRA) 1000 MG tablet Take 1 tablet (1,000 mg total) by mouth 2 (two) times daily. 180 tablet 4   lisinopril (ZESTRIL) 2.5 MG tablet TAKE 1 TABLET DAILY 90 tablet 3   meloxicam (MOBIC) 15 MG tablet Take 15 mg by mouth daily.     metFORMIN (GLUCOPHAGE) 1000 MG tablet TAKE 1 TABLET TWICE DAILY  WITH MEALS 180 tablet 1   Na Sulfate-K Sulfate-Mg Sulf 17.5-3.13-1.6 GM/177ML SOLN Use as directed; may use generic; goodrx card if insurance will not cover generic 354 mL 0   omeprazole (PRILOSEC) 40 MG capsule TAKE 1 CAPSULE DAILY 90 capsule 3   pioglitazone (ACTOS) 45 MG tablet TAKE 1 TABLET DAILY 90 tablet 3   predniSONE (DELTASONE) 10 MG tablet 3 tabs by mouth per day for 3 days,2tabs per day for 3 days,1tab per day for 3 days 18 tablet 0   sitaGLIPtin (JANUVIA) 100 MG tablet Take 1 tablet (100 mg total) by mouth daily. 90 tablet 3   tamsulosin (FLOMAX) 0.4 MG CAPS capsule Take 0.4 mg by mouth daily.     triamcinolone cream (KENALOG) 0.1 % SMARTSIG:sparingly Topical Twice Daily PRN     valACYclovir (VALTREX) 1000 MG tablet Take 1 tablet (1,000 mg total) by mouth 2 (two) times daily as needed. 20 tablet 5   vitamin B-12 (CYANOCOBALAMIN) 1000 MCG tablet Take 1 tablet (1,000 mcg total) by mouth daily. 90 tablet 1   No current facility-administered medications on file prior to visit.        ROS:  All others reviewed and negative.  Objective        PE:  BP 122/60 (BP Location: Right Arm, Patient Position: Sitting, Cuff Size: Normal)   Pulse 76   Temp  99.5 F (37.5 C) (Oral)   Ht 6' (1.829 m)   Wt 244 lb (110.7 kg)   SpO2 94%   BMI 33.09 kg/m                 Constitutional: Pt appears in NAD               HENT: Head: NCAT.                Right Ear: External ear normal.                 Left Ear: External ear normal.  Eyes: . Pupils are equal, round, and reactive to light. Conjunctivae and EOM are normal               Nose: without d/c or deformity               Neck: Neck supple. Gross normal ROM               Cardiovascular: Normal rate and regular rhythm.                 Pulmonary/Chest: Effort normal and breath sounds without rales or wheezing.                Abd:  Soft, NT, ND, + BS, no organomegaly               Neurological: Pt is alert. At baseline orientation, motor grossly intact               Skin: Skin is warm. No rashes, no other new lesions, LE edema - none               Psychiatric: Pt behavior is normal without agitation   Micro: none  Cardiac tracings I have personally interpreted today:  none  Pertinent Radiological findings (summarize): none   Lab Results  Component Value Date   WBC 7.3 01/15/2024   HGB 14.6 01/15/2024   HCT 44.1 01/15/2024   PLT 150.0 01/15/2024   GLUCOSE 129 (H) 01/15/2024   CHOL 91 01/15/2024   TRIG 65.0 01/15/2024   HDL 41.60 01/15/2024   LDLDIRECT 85.7 06/11/2013   LDLCALC 37 01/15/2024   ALT 20 01/15/2024   AST 17 01/15/2024   NA 138 01/15/2024   K 4.6 01/15/2024   CL 103 01/15/2024   CREATININE 1.08 01/15/2024   BUN 20 01/15/2024   CO2 27 01/15/2024   TSH 0.18 (L) 01/15/2024   PSA 2.54 07/17/2023   INR 1.07 09/06/2014   HGBA1C 6.7 (H) 01/15/2024   MICROALBUR 2.2 (H) 01/15/2024   Assessment/Plan:  Allen Myers is a 78 y.o. White or Caucasian [1] male with  has a past medical history of Allergy, Arthritis, CAD (coronary artery disease), Erectile dysfunction, History of colon polyps, History of diverticulitis, HYPERTENSION, Hypothyroidism, Kidney stones,  Melanoma (HCC) (12/10/2019), Peripheral arterial disease (HCC), SAH (subarachnoid hemorrhage) (HCC), SDH (subdural hematoma) (HCC), Seizures (HCC), Sleep apnea, Sleep apnea in adult, Syncope and collapse, and Type II diabetes mellitus (HCC).  Vitamin D deficiency Last vitamin D Lab Results  Component Value Date   VD25OH 43.13 07/17/2023   Stable, cont oral replacement   Diabetes mellitus type 2, noninsulin dependent (HCC) Lab Results  Component Value Date   HGBA1C 6.7 (H) 01/15/2024   With uncontrolled obesity, pt to continue current medical treatment metformin 1000 bid, actos 45 mg, and increase ozempic to 1 mg weekly   Essential hypertension BP Readings from Last 3 Encounters:  01/15/24 122/60  11/17/23 126/62  08/26/23 116/68   Stable, pt to continue medical treatment coreg 3,125 mg bid, lisinopril 2.5 qd   B12 deficiency Lab Results  Component Value Date   VITAMINB12 >1501 (H) 07/17/2023   Stable, cont oral replacement - b12 1000 mcg qd   HLD (hyperlipidemia) Lab Results  Component Value Date   LDLCALC 37 01/15/2024   Stable, pt to continue current statin lipitor 80 qd  Followup: Return in about 6 months (around 07/16/2024).  Oliver Barre, MD 01/17/2024 9:01 PM [facility] [place] [facility] -  [facility] Internal Medicine

## 2024-01-15 NOTE — Patient Instructions (Signed)
 Ok to increase the ozempic to 1 mg weekly  Please continue all other medications as before, and refills have been done if requested.  Please have the pharmacy call with any other refills you may need.  Please continue your efforts at being more active, low cholesterol diet, and weight control.  You are otherwise up to date with prevention measures today.  Please keep your appointments with your specialists as you may have planned  Please go to the LAB at the blood drawing area for the tests to be done  You will be contacted by phone if any changes need to be made immediately.  Otherwise, you will receive a letter about your results with an explanation, but please check with MyChart first.  Please make an Appointment to return in 6 months, or sooner if needed

## 2024-01-17 ENCOUNTER — Encounter: Payer: Self-pay | Admitting: Internal Medicine

## 2024-01-17 NOTE — Assessment & Plan Note (Signed)
 Lab Results  Component Value Date   LDLCALC 37 01/15/2024   Stable, pt to continue current statin lipitor 80 qd

## 2024-01-17 NOTE — Assessment & Plan Note (Signed)
 BP Readings from Last 3 Encounters:  01/15/24 122/60  11/17/23 126/62  08/26/23 116/68   Stable, pt to continue medical treatment coreg 3,125 mg bid, lisinopril 2.5 qd

## 2024-01-17 NOTE — Assessment & Plan Note (Signed)
Last vitamin D Lab Results  Component Value Date   VD25OH 43.13 07/17/2023   Stable, cont oral replacement

## 2024-01-17 NOTE — Assessment & Plan Note (Signed)
Lab Results  Component Value Date   VITAMINB12 >1501 (H) 07/17/2023   Stable, cont oral replacement - b12 1000 mcg qd

## 2024-01-17 NOTE — Assessment & Plan Note (Signed)
 Lab Results  Component Value Date   HGBA1C 6.7 (H) 01/15/2024   With uncontrolled obesity, pt to continue current medical treatment metformin 1000 bid, actos 45 mg, and increase ozempic to 1 mg weekly

## 2024-02-18 ENCOUNTER — Ambulatory Visit: Payer: Self-pay

## 2024-02-18 ENCOUNTER — Ambulatory Visit: Payer: Self-pay | Admitting: *Deleted

## 2024-02-18 NOTE — Telephone Encounter (Signed)
  Chief Complaint: URI Symptoms: Cough, fatigue, sore throat, runny nose Frequency: since Monday Pertinent Negatives: Patient denies fever Disposition: [] ED /[] Urgent Care (no appt availability in office) / [] Appointment(In office/virtual)/ []  [facility]/ [x] Home Care/ [] Refused Recommended Disposition /[] [facility]/ [x]  Follow-up with PCP Additional Notes: Patient called in asking if his PCP, Dr. Autry Legions, can call in the same medication he had in February for his URI. Patient states the same symptoms are returning and Dr. Autry Legions mentioned to him last appt he needed to get on top of the symptoms within 48 hours. Patient denies colored phlegm, fever, body aches. Patient is experiencing a dry cough, sore throat, runny nose. Patient denies wheezing as well. Patient symptoms treatable at home currently, but patient would like message passed to PCP to advise about same medications as February illness. Please advise.   Copied from CRM [id number]. Topic: Clinical - Pink Word Triage >> Feb 18, 2024  8:46 AM Juleen [place] F wrote: Reason for Triage: Patient believes he has same virus he had before - he's having a cough, fatigue, and stuffy nose. He would like the same medications from last time since they helped - cough medicine, antibiotic and steroid. Please call him at [phone] Reason for Disposition  Cough  Answer Assessment - Initial Assessment Questions 1. ONSET: "When did the cough begin?"      Monday 2. SEVERITY: "How bad is the cough today?"      A lot of coughing, especially at night 3. SPUTUM: "Describe the color of your sputum" (none, dry cough; clear, white, yellow, green)     Dry for now 4. HEMOPTYSIS: "Are you coughing up any blood?" If so ask: "How much?" (flecks, streaks, tablespoons, etc.)     no 5. DIFFICULTY BREATHING: "Are you having difficulty breathing?" If Yes, ask: "How bad is it?" (e.g., mild, moderate, severe)    - MILD: No SOB at rest, mild SOB with walking,  speaks normally in sentences, can lie down, no retractions, pulse < 100.    - MODERATE: SOB at rest, SOB with minimal exertion and prefers to sit, cannot lie down flat, speaks in phrases, mild retractions, audible wheezing, pulse 100-120.    - SEVERE: Very SOB at rest, speaks in single words, struggling to breathe, sitting hunched forward, retractions, pulse > 120      mild 6. FEVER: "Do you have a fever?" If Yes, ask: "What is your temperature, how was it measured, and when did it start?"     No 7. CARDIAC HISTORY: "Do you have any history of heart disease?" (e.g., heart attack, congestive heart failure)      HTN 8. LUNG HISTORY: "Do you have any history of lung disease?"  (e.g., pulmonary embolus, asthma, emphysema)     No 9. PE RISK FACTORS: "Do you have a history of blood clots?" (or: recent major surgery, recent prolonged travel, bedridden)     N/a 10. OTHER SYMPTOMS: "Do you have any other symptoms?" (e.g., runny nose, wheezing, chest pain)       Stuffy nose, sore throat  Protocols used: Cough - Acute Productive-A-AH

## 2024-02-18 NOTE — Telephone Encounter (Signed)
 3rd attempt to return his call.   Get same recording as before.   Per policy will forward to [facility] due to 3 failed attempts to contact him.

## 2024-02-18 NOTE — Telephone Encounter (Signed)
 Message from [place] F sent at 02/18/2024  8:49 AM EDT  Summary: Cough, Fatigue, Stuffy nose   Copied From CRM [id number]. Reason for Triage: Patient believes he has same virus he had before - he's having a cough, fatigue, and stuffy nose. He would like the same medications from last time since they helped - cough medicine, antibiotic and steroid. Please call him at [phone]          Call History  Contact Date/Time Type Contact Phone/Fax By  02/18/2024 08:42 AM EDT Phone (Incoming) Hoy, Ly[place] [phone] Joye Nobles

## 2024-02-18 NOTE — Telephone Encounter (Signed)
 2nd attempt to return his call.   Get recording "Your call cannot be completed as dialed".  "Please try your call again later".   Will route to call back basket for further attempts.

## 2024-02-18 NOTE — Telephone Encounter (Signed)
 This RN made the first attempt to contact the patient. RN received the message "call cannot be completed as dialed." RN unable to LVM.  Copied from CRM [id number]. Topic: Clinical - Pink Word Triage >> Feb 18, 2024  8:46 AM Allen Myers wrote: Reason for Triage: Patient believes he has same virus he had before - he's having a cough, fatigue, and stuffy nose. He would like the same medications from last time since they helped - cough medicine, antibiotic and steroid. Please call him at [phone]

## 2024-02-19 NOTE — Telephone Encounter (Signed)
 Other Phone note has been forwarded to PCP.

## 2024-02-19 NOTE — Telephone Encounter (Signed)
 This patient had influenza A in feb 2025, but we would need to see in office if he thinks has the same or similar illness.  thanks

## 2024-02-19 NOTE — Telephone Encounter (Signed)
 Pt states he is not feeling bad like he was and has been doing the home remedies that was recommended says he is feeling a lot better , doesn't think he needs a OV at this time.

## 2024-03-03 ENCOUNTER — Other Ambulatory Visit: Payer: Self-pay | Admitting: Internal Medicine

## 2024-03-15 ENCOUNTER — Encounter: Payer: Self-pay | Admitting: Family Medicine

## 2024-03-15 ENCOUNTER — Ambulatory Visit (INDEPENDENT_AMBULATORY_CARE_PROVIDER_SITE_OTHER): Admitting: Family Medicine

## 2024-03-15 VITALS — BP 106/58 | HR 77 | Temp 97.9°F | Resp 18 | Ht 72.0 in | Wt 243.0 lb

## 2024-03-15 DIAGNOSIS — J988 Other specified respiratory disorders: Secondary | ICD-10-CM

## 2024-03-15 DIAGNOSIS — B9689 Other specified bacterial agents as the cause of diseases classified elsewhere: Secondary | ICD-10-CM | POA: Diagnosis not present

## 2024-03-15 MED ORDER — PREDNISONE 10 MG (21) PO TBPK: ORAL_TABLET | ORAL | 0 refills | Status: DC

## 2024-03-15 MED ORDER — AMOXICILLIN-POT CLAVULANATE 875-125 MG PO TABS: 1.0000 | ORAL_TABLET | Freq: Two times a day (BID) | ORAL | 0 refills | Status: AC

## 2024-03-15 NOTE — Progress Notes (Signed)
 Assessment & Plan:  1. Bacterial respiratory infection (Primary) Education provided on URI. Encouraged symptom management including throat lozenges, chloraseptic spray, warm salt water  gargles, hot tea/honey, cough syrup (Delsym), Tylenol , Vicks, and a humidifier at night.  - predniSONE  (STERAPRED UNI-PAK 21 TAB) 10 MG (21) TBPK tablet; Use as directed.  Dispense: 21 each; Refill: 0 - amoxicillin-clavulanate (AUGMENTIN) 875-125 MG tablet; Take 1 tablet by mouth 2 (two) times daily for 7 days.  Dispense: 14 tablet; Refill: 0   Follow up plan: Return if symptoms worsen or fail to improve.  Hershel Los, MSN, APRN, FNP-C  Subjective:  HPI: Allen Myers is a 78 y.o. male presenting on 03/15/2024 for Cough (Started 6 days ago: productive cough = greenish, ribs sore from cough. Had ST, better now, does have nasal drainage./No fever.)  Patient complains of a productive cough, chest congestion, runny nose, sneezing, postnasal drainage, and shortness of breath. Sputum is green and thick. He denies fever. Onset of symptoms was 6 days ago, unchanged since that time. He is drinking plenty of fluids. Evaluation to date: none. Treatment to date: Mucus DM, Delsym.  He does not smoke.    ROS: Negative unless specifically indicated above in HPI.   Relevant past medical history reviewed and updated as indicated.   Allergies and medications reviewed and updated.   Current Outpatient Medications:    albuterol  (VENTOLIN  HFA) 108 (90 Base) MCG/ACT inhaler, Inhale 2 puffs into the lungs every 6 (six) hours as needed for wheezing or shortness of breath., Disp: 8 g, Rfl: 1   Ascorbic Acid (VITAMIN C PO), Take 1 tablet by mouth., Disp: , Rfl:    aspirin  EC 81 MG tablet, Take 81 mg by mouth daily., Disp: , Rfl:    atorvastatin  (LIPITOR ) 80 MG tablet, TAKE 1 TABLET DAILY, Disp: 90 tablet, Rfl: 2   carvedilol  (COREG ) 3.125 MG tablet, TAKE 1 TABLET TWICE A DAY  WITH MEALS, Disp: 180 tablet, Rfl: 3   cilostazol   (PLETAL ) 100 MG tablet, TAKE 1 TABLET TWICE A DAY, Disp: 180 tablet, Rfl: 3   levETIRAcetam  (KEPPRA ) 1000 MG tablet, Take 1 tablet (1,000 mg total) by mouth 2 (two) times daily., Disp: 180 tablet, Rfl: 4   levothyroxine  (SYNTHROID ) 125 MCG tablet, Take 1 tablet (125 mcg total) by mouth daily., Disp: 90 tablet, Rfl: 3   lisinopril  (ZESTRIL ) 2.5 MG tablet, TAKE 1 TABLET DAILY, Disp: 90 tablet, Rfl: 3   metFORMIN  (GLUCOPHAGE ) 1000 MG tablet, TAKE 1 TABLET TWICE DAILY  WITH MEALS, Disp: 180 tablet, Rfl: 1   omeprazole  (PRILOSEC) 40 MG capsule, TAKE 1 CAPSULE DAILY, Disp: 90 capsule, Rfl: 3   pioglitazone  (ACTOS ) 45 MG tablet, TAKE 1 TABLET DAILY, Disp: 90 tablet, Rfl: 3   Semaglutide , 1 MG/DOSE, 4 MG/3ML SOPN, Inject 1 mg as directed once a week., Disp: 9 mL, Rfl: 3   sitaGLIPtin  (JANUVIA ) 100 MG tablet, Take 1 tablet (100 mg total) by mouth daily., Disp: 90 tablet, Rfl: 3   tamsulosin  (FLOMAX ) 0.4 MG CAPS capsule, Take 0.4 mg by mouth daily., Disp: , Rfl:    vitamin B-12 (CYANOCOBALAMIN ) 1000 MCG tablet, Take 1 tablet (1,000 mcg total) by mouth daily., Disp: 90 tablet, Rfl: 1   meloxicam (MOBIC) 15 MG tablet, Take 15 mg by mouth daily. (Patient not taking: Reported on 03/15/2024), Disp: , Rfl:    triamcinolone  cream (KENALOG ) 0.1 %, SMARTSIG:sparingly Topical Twice Daily PRN (Patient not taking: Reported on 03/15/2024), Disp: , Rfl:   No Known Allergies  Objective:   BP (!) 106/58   Pulse 77   Temp 97.9 F (36.6 C)   Resp 18   Ht 6' (1.829 m)   Wt 243 lb (110.2 kg)   SpO2 97%   BMI 32.96 kg/m    Physical Exam Vitals reviewed.  Constitutional:      General: He is not in acute distress.    Appearance: Normal appearance. He is not ill-appearing, toxic-appearing or diaphoretic.  HENT:     Head: Normocephalic and atraumatic.     Right Ear: Tympanic membrane, ear canal and external ear normal. There is no impacted cerumen.     Left Ear: Tympanic membrane, ear canal and external ear normal.  There is no impacted cerumen.     Nose: Congestion present.     Right Sinus: No maxillary sinus tenderness or frontal sinus tenderness.     Left Sinus: No maxillary sinus tenderness or frontal sinus tenderness.     Mouth/Throat:     Mouth: Mucous membranes are moist.     Pharynx: Oropharynx is clear. No oropharyngeal exudate or posterior oropharyngeal erythema.     Tonsils: No tonsillar exudate or tonsillar abscesses.  Eyes:     General: No scleral icterus.       Right eye: No discharge.        Left eye: No discharge.     Conjunctiva/sclera: Conjunctivae normal.  Cardiovascular:     Rate and Rhythm: Normal rate.  Pulmonary:     Effort: Pulmonary effort is normal. No respiratory distress.  Musculoskeletal:        General: Normal range of motion.     Cervical back: Normal range of motion.  Lymphadenopathy:     Cervical: No cervical adenopathy.  Skin:    General: Skin is warm and dry.  Neurological:     Mental Status: He is alert and oriented to person, place, and time. Mental status is at baseline.  Psychiatric:        Mood and Affect: Mood normal.        Behavior: Behavior normal.        Thought Content: Thought content normal.        Judgment: Judgment normal.

## 2024-03-15 NOTE — Patient Instructions (Signed)
Throat lozenges, chloraseptic spray, warm salt water gargles, hot tea/honey, cough syrup (Delsym), Tylenol, Vicks, and a humidifier at night.

## 2024-03-18 ENCOUNTER — Ambulatory Visit

## 2024-03-18 VITALS — Ht 73.0 in | Wt 239.0 lb

## 2024-03-18 DIAGNOSIS — Z0000 Encounter for general adult medical examination without abnormal findings: Secondary | ICD-10-CM

## 2024-03-18 NOTE — Patient Instructions (Addendum)
 [name] , Thank you for taking time out of your busy schedule to complete your Annual Wellness Visit with me. I enjoyed our conversation and look forward to speaking with you again next year. I, as well as your care team,  appreciate your ongoing commitment to your health goals. Please review the following plan we discussed and let me know if I can assist you in the future. Your Game plan/ To Do List   Follow up Visits: Next Medicare AWV with our clinical staff: 03/22/2024   Have you seen your provider in the last 6 months (3 months if uncontrolled diabetes)? Yes Next Office Visit with your provider: 07/26/2024  Clinician Recommendations:  Aim for 30 minutes of exercise or brisk walking, 6-8 glasses of water , and 5 servings of fruits and vegetables each day.       This is a list of the screening recommended for you and due dates:  Health Maintenance  Topic Date Due   Hemoglobin A1C  07/16/2024   Eye exam for diabetics  08/31/2024   Yearly kidney function blood test for diabetes  01/14/2025   Yearly kidney health urinalysis for diabetes  01/14/2025   Complete foot exam   01/14/2025   Medicare Annual Wellness Visit  03/18/2025   DTaP/Tdap/Td vaccine (5 - Td or Tdap) 03/05/2033   Pneumonia Vaccine  Completed   Hepatitis C Screening  Completed   Zoster (Shingles) Vaccine  Completed   HPV Vaccine  Aged Out   Meningitis B Vaccine  Aged Out   Flu Shot  Discontinued   Colon Cancer Screening  Discontinued   COVID-19 Vaccine  Discontinued    Advanced directives: (Copy Requested) Please bring a copy of your health care power of attorney and living will to the office to be added to your chart at your convenience. You can mail to [facility] [address] [place], [place] [address] or email to [email or web address] Advance Care Planning is important because it:  [x]  Makes sure you receive the medical care that is consistent with your values, goals, and  preferences  [x]  It provides guidance to your family and loved ones and reduces their decisional burden about whether or not they are making the right decisions based on your wishes.  Follow the link provided in your after visit summary or read over the paperwork we have mailed to you to help you started getting your Advance Directives in place. If you need assistance in completing these, please reach out to us  so that we can help you!

## 2024-03-18 NOTE — Progress Notes (Signed)
 Subjective:   Allen Myers is a 78 y.o. who presents for a Medicare Wellness preventive visit.  As a reminder, Annual Wellness Visits don't include a physical exam, and some assessments may be limited, especially if this visit is performed virtually. We may recommend an in-person follow-up visit with your provider if needed.  Visit Complete: Virtual I connected with  Allen Myers on 03/18/24 by a audio enabled telemedicine application and verified that I am speaking with the correct person using two identifiers.  Patient Location: Home  Provider Location: Office/Clinic  I discussed the limitations of evaluation and management by telemedicine. The patient expressed understanding and agreed to proceed.  Vital Signs: Because this visit was a virtual/telehealth visit, some criteria may be missing or patient reported. Any vitals not documented were not able to be obtained and vitals that have been documented are patient reported.  VideoDeclined- This patient declined Librarian, academic. Therefore the visit was completed with audio only.  Persons Participating in Visit: Patient.  AWV Questionnaire: Yes: Patient Medicare AWV questionnaire was completed by the patient on 03/15/2024; I have confirmed that all information answered by patient is correct and no changes since this date.  Cardiac Risk Factors include: advanced age (>57men, >66 women);diabetes mellitus;dyslipidemia;male gender;hypertension;obesity (BMI >30kg/m2)     Objective:     Today's Vitals   03/18/24 0918  Weight: 239 lb (108.4 kg)  Height: 6\' 1"  (1.854 m)   Body mass index is 31.53 kg/m.     03/18/2024    9:16 AM 03/17/2023    1:52 PM 02/27/2022    2:33 PM 01/08/2021    7:23 AM 07/02/2019   12:58 PM 06/04/2019    6:27 PM 09/29/2018    9:54 AM  Advanced Directives  Does Patient Have a Medical Advance Directive? Yes No Yes Yes Yes No Yes  Type of Estate agent of  [place];Living will  Living will;Healthcare Power of [employer] Power of [place];Living will   Healthcare Power of [place];Living will  Does patient want to make changes to medical advance directive?  Yes (MAU/Ambulatory/Procedural Areas - Information given) No - Patient declined  No - Patient declined  No - Patient declined  Copy of Healthcare Power of Attorney in Chart? No - copy requested  No - copy requested    Yes - validated most recent copy scanned in chart (See row information)    Current Medications (verified) Outpatient Encounter Medications as of 03/18/2024  Medication Sig   amoxicillin-clavulanate (AUGMENTIN) 875-125 MG tablet Take 1 tablet by mouth 2 (two) times daily for 7 days.   Ascorbic Acid (VITAMIN C PO) Take 1 tablet by mouth.   aspirin  EC 81 MG tablet Take 81 mg by mouth daily.   atorvastatin  (LIPITOR ) 80 MG tablet TAKE 1 TABLET DAILY   carvedilol  (COREG ) 3.125 MG tablet TAKE 1 TABLET TWICE A DAY  WITH MEALS   cilostazol  (PLETAL ) 100 MG tablet TAKE 1 TABLET TWICE A DAY   levETIRAcetam  (KEPPRA ) 1000 MG tablet Take 1 tablet (1,000 mg total) by mouth 2 (two) times daily.   levothyroxine  (SYNTHROID ) 125 MCG tablet Take 1 tablet (125 mcg total) by mouth daily.   lisinopril  (ZESTRIL ) 2.5 MG tablet TAKE 1 TABLET DAILY   meloxicam (MOBIC) 15 MG tablet Take 15 mg by mouth daily.   omeprazole  (PRILOSEC) 40 MG capsule TAKE 1 CAPSULE DAILY   pioglitazone  (ACTOS ) 45 MG tablet TAKE 1 TABLET DAILY   Semaglutide , 1 MG/DOSE, 4  MG/3ML SOPN Inject 1 mg as directed once a week.   sitaGLIPtin  (JANUVIA ) 100 MG tablet Take 1 tablet (100 mg total) by mouth daily.   tamsulosin  (FLOMAX ) 0.4 MG CAPS capsule Take 0.4 mg by mouth daily.   triamcinolone  cream (KENALOG ) 0.1 %    vitamin B-12 (CYANOCOBALAMIN ) 1000 MCG tablet Take 1 tablet (1,000 mcg total) by mouth daily.   [DISCONTINUED] predniSONE  (STERAPRED UNI-PAK 21 TAB) 10 MG (21) TBPK tablet Use as directed.   albuterol  (VENTOLIN  HFA)  108 (90 Base) MCG/ACT inhaler Inhale 2 puffs into the lungs every 6 (six) hours as needed for wheezing or shortness of breath. (Patient not taking: Reported on 03/18/2024)   metFORMIN  (GLUCOPHAGE ) 1000 MG tablet TAKE 1 TABLET TWICE DAILY  WITH MEALS (Patient not taking: Reported on 03/18/2024)   No facility-administered encounter medications on file as of 03/18/2024.    Allergies (verified) Patient has no known allergies.   History: Past Medical History:  Diagnosis Date   Allergy    Arthritis    CAD (coronary artery disease)    Erectile dysfunction    History of colon polyps    History of diverticulitis    HYPERTENSION    Hypothyroidism    Kidney stones    "passed it"   Melanoma (HCC) 12/10/2019   Peripheral arterial disease (HCC)    SAH (subarachnoid hemorrhage) (HCC)    Following syncopal episode   SDH (subdural hematoma) (HCC)    Following syncopal episode   Seizures (HCC)    08/2014 - controlled   Sleep apnea    "mild"  does not use CPAP   Sleep apnea in adult    Syncope and collapse    Type II diabetes mellitus (HCC)    type 2   Past Surgical History:  Procedure Laterality Date   COLONOSCOPY  10/14/2012   polyp   CORONARY ANGIOPLASTY WITH STENT PLACEMENT  05/11/2014   "1"   CRANIOTOMY Left 09/12/2014   Procedure: CRANIOTOMY HEMATOMA EVACUATION SUBDURAL;  Surgeon: Manya Sells, MD;  Location: [facility];  Service: Neurosurgery;  Laterality: Left;   EXTRACORPOREAL SHOCK WAVE LITHOTRIPSY Left 01/08/2021   Procedure: EXTRACORPOREAL SHOCK WAVE LITHOTRIPSY (ESWL);  Surgeon: Florencio Hunting, MD;  Location: [facility];  Service: Urology;  Laterality: Left;   FRACTIONAL FLOW RESERVE WIRE  05/11/2014   Procedure: FRACTIONAL FLOW RESERVE WIRE;  Surgeon: Mickiel Albany, MD;  Location: [facility] CATH LAB;  Service: Cardiovascular;;   LEFT HEART CATHETERIZATION WITH CORONARY ANGIOGRAM N/A 05/11/2014   Procedure: LEFT HEART CATHETERIZATION WITH CORONARY ANGIOGRAM;   Surgeon: Mickiel Albany, MD;  Location: [facility];  Service: Cardiovascular;  Laterality: N/A;   POLYPECTOMY     Family History  Problem Relation Age of Onset   Lung cancer Father        dad was a smoker   Heart disease Father    AAA (abdominal aortic aneurysm) Father    Heart disease Mother    Ataxia Maternal Grandmother    Dementia Maternal Grandmother    Aplastic anemia Sister    Prostate cancer Brother    Lung cancer Cousin    Colon cancer Neg Hx    Rectal cancer Neg Hx    Stomach cancer Neg Hx    Colon polyps Neg Hx    Esophageal cancer Neg Hx    Social History   Socioeconomic History   Marital status: Married    Spouse name: Ave Leisure   Number of children: 3  Years of education: 34   Highest education level: 12th grade  Occupational History   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)    Employer: [employer]   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[place]    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   Patient is right handed.   Patient has hs education.   Patient drinks decaf, and soda occasionally.   Social Drivers of Corporate investment banker Strain: Low Risk  (03/18/2024)   Overall Financial Resource Strain (CARDIA)    Difficulty of Paying Living Expenses: Not hard at all  Food Insecurity: No Food Insecurity (03/18/2024)   Hunger Vital Sign    Worried About Running Out of Food in the Last Year:  Never true    Ran Out of Food in the Last Year: Never true  Transportation Needs: No Transportation Needs (03/18/2024)   PRAPARE - Administrator, Civil Service (Medical): No    Lack of Transportation (Non-Medical): No  Physical Activity: Sufficiently Active (03/18/2024)   Exercise Vital Sign    Days of Exercise per Week: 4 days    Minutes of Exercise per Session: 50 min  Stress: No Stress Concern Present (03/18/2024)   [employer] of Occupational Health - Occupational Stress Questionnaire    Feeling of Stress : Not at all  Social Connections: Socially Integrated (03/18/2024)   Social Connection and Isolation Panel [NHANES]    Frequency of Communication with Friends and Family: Twice a week    Frequency of Social Gatherings with Friends and Family: Twice a week    Attends Religious Services: More than 4 times per year    Active Member of [employer] or Organizations: Yes    Attends Engineer, structural: More than 4 times per year    Marital Status: Married    Tobacco Counseling Counseling given: No    Clinical Intake:  Pre-visit preparation completed: Yes  Pain : No/denies pain     BMI - recorded: 31.53 Nutritional Status: BMI > 30  Obese Nutritional Risks: None Diabetes: Yes CBG done?: No Did pt. bring in CBG monitor from home?: No  Lab Results  Component Value Date   HGBA1C 6.7 (H) 01/15/2024   HGBA1C 7.4 (H) 07/17/2023   HGBA1C 7.4 (H) 08/07/2022     How often do you need to have someone help you when you read instructions, pamphlets, or other written materials from your doctor or pharmacy?: 1 - Never  Interpreter Needed?: No  Information entered by :: Kandy Orris, CMA   Activities of Daily Living     03/18/2024    9:21 AM 03/15/2024    5:13 PM  In your present state of health, do you have any difficulty performing the following activities:  Hearing? 0 0  Vision? 0 0  Difficulty concentrating or making  decisions? 0 0  Walking or climbing  stairs? 0 0  Dressing or bathing? 0 0  Doing errands, shopping? 0 0  Preparing Food and eating ? N N  Using the Toilet? N N  In the past six months, have you accidently leaked urine? N N  Do you have problems with loss of bowel control? N N  Managing your Medications? N N  Managing your Finances? N N  Housekeeping or managing your Housekeeping? N N    Patient Care Team: Roslyn Coombe, MD as PCP - General Audery Blazing Deannie Fabian, MD as PCP - Cardiology (Cardiology) Johny Nap, NP as Nurse Practitioner (Neurology) Glory Larsen, MD as Referring Physician (Dermatology)  I have updated your Care Teams any recent Medical Services you may have received from other providers in the past year.     Assessment:    This is a routine wellness examination for Cong.  Hearing/Vision screen Hearing Screening - Comments:: Denies hearing difficulties   Vision Screening - Comments:: Wears rx glasses - up to date with routine eye exams with [facility]   Goals Addressed               This Visit's Progress     Weight (lb) < 200 lb (90.7 kg) (pt-stated)   239 lb (108.4 kg)     Patient stated he is planning to lose about 10lbs more        Depression Screen     03/18/2024    9:23 AM 03/15/2024    8:25 AM 01/15/2024    8:51 AM 11/17/2023    8:50 AM 07/17/2023    9:59 AM 03/17/2023    1:51 PM 08/07/2022    9:35 AM  PHQ 2/9 Scores  PHQ - 2 Score 0 0 0 0 0 0 0  PHQ- 9 Score 0      0    Fall Risk     03/18/2024    9:23 AM 03/15/2024    5:13 PM 01/15/2024    8:55 AM 11/17/2023    8:50 AM 07/17/2023    9:59 AM  Fall Risk   Falls in the past year? 0 0 0 0 0  Number falls in past yr: 0 0 0 0 0  Injury with Fall? 0 0 0 0 0  Risk for fall due to : No Fall Risks  No Fall Risks No Fall Risks No Fall Risks  Follow up Falls evaluation completed;Falls prevention discussed  Falls evaluation completed Falls evaluation completed Falls evaluation completed    MEDICARE RISK AT HOME:  Medicare Risk  at Home Any stairs in or around the home?: Yes If so, are there any without handrails?: No Home free of loose throw rugs in walkways, pet beds, electrical cords, etc?: No Adequate lighting in your home to reduce risk of falls?: Yes Life alert?: No Use of a cane, walker or w/c?: No Grab bars in the bathroom?: Yes Shower chair or bench in shower?: Yes Elevated toilet seat or a handicapped toilet?: No  TIMED UP AND GO:  Was the test performed?  No  Cognitive Function: 6CIT completed        03/18/2024    9:25 AM 03/17/2023    1:53 PM 02/27/2022    2:46 PM  6CIT Screen  What Year? 0 points 0 points 0 points  What month? 0 points 0 points 0 points  What time? 0 points 0 points 0 points  Count back from 20 0 points 0  points 0 points  Months in reverse 0 points 0 points 0 points  Repeat phrase 0 points 0 points 0 points  Total Score 0 points 0 points 0 points    Immunizations Immunization History  Administered Date(s) Administered   Fluad Quad(high Dose 65+) 06/09/2019, 08/07/2022   Influenza Whole 12/11/2009, 06/19/2011   Influenza, High Dose Seasonal PF 08/07/2015, 07/24/2016, 07/10/2017, 06/16/2018   Influenza,inj,Quad PF,6+ Mos 06/15/2013, 07/26/2014   PFIZER(Purple Top)SARS-COV-2 Vaccination 12/30/2019, 01/24/2020, 07/29/2020   Pneumococcal Conjugate-13 08/10/2014   Pneumococcal Polysaccharide-23 05/16/2008, 08/23/2015   Rsv, Mab, Trudy Fusi, 0.5 Ml, Neonate To 24 Mos(Beyfortus) 05/20/2023   Td 02/20/1996, 11/21/2008, 10/10/2010   Tdap 03/06/2023   Zoster Recombinant(Shingrix) 03/06/2023, 05/20/2023   Zoster, Live 05/16/2008    Screening Tests Health Maintenance  Topic Date Due   HEMOGLOBIN A1C  07/16/2024   OPHTHALMOLOGY EXAM  08/31/2024   Diabetic kidney evaluation - eGFR measurement  01/14/2025   Diabetic kidney evaluation - Urine ACR  01/14/2025   FOOT EXAM  01/14/2025   Medicare Annual Wellness (AWV)  03/18/2025   DTaP/Tdap/Td (5 - Td or Tdap) 03/05/2033    Pneumonia Vaccine 23+ Years old  Completed   Hepatitis C Screening  Completed   Zoster Vaccines- Shingrix  Completed   HPV VACCINES  Aged Out   Meningococcal B Vaccine  Aged Out   INFLUENZA VACCINE  Discontinued   Colonoscopy  Discontinued   COVID-19 Vaccine  Discontinued    Health Maintenance  There are no preventive care reminders to display for this patient. Health Maintenance Items Addressed: 03/18/2024   Additional Screening:  Vision Screening: Recommended annual ophthalmology exams for early detection of glaucoma and other disorders of the eye. Pt stated he's due for an eye exam w/[facility] in 08/2024.  Dental Screening: Recommended annual dental exams for proper oral hygiene  Community Resource Referral / Chronic Care Management: CRR required this visit?  No   CCM required this visit?  No   Plan:    I have personally reviewed and noted the following in the patient's chart:   Medical and social history Use of alcohol, tobacco or illicit drugs  Current medications and supplements including opioid prescriptions. Patient is not currently taking opioid prescriptions. Functional ability and status Nutritional status Physical activity Advanced directives List of other physicians Hospitalizations, surgeries, and ER visits in previous 12 months Vitals Screenings to include cognitive, depression, and falls Referrals and appointments  In addition, I have reviewed and discussed with patient certain preventive protocols, quality metrics, and best practice recommendations. A written personalized care plan for preventive services as well as general preventive health recommendations were provided to patient.   Patria Bookbinder, CMA   03/18/2024   After Visit Summary: (MyChart) Due to this being a telephonic visit, the after visit summary with patients personalized plan was offered to patient via MyChart   Notes: Nothing significant to report at this time.

## 2024-03-24 NOTE — Progress Notes (Signed)
 [facility]  PATIENT: Allen Myers DOB: 1946/03/30   REASON FOR VISIT: SDH 03/2014 and seizure disorder HISTORY FROM: patient   Chief complaint: Chief Complaint  Patient presents with   Follow-up    Pt alone, rm 3. Here for yearly follow up. Overall stable. No issues or concerns. Denies any new seizure.     Follow-up visit:  Prior visit: 03/20/2023 with Isa Manuel, NP   Brief HPI  Allen Myers is a 78 y.o. male who continues to be followed in this office for history of traumatic right subdural hematoma 03/2014 with resultant seizure disorder, recurrent left SDH post fall 08/2014, s/p SDH evacuation 08/2014.  No recurrent seizures since 2015.  At prior visit with Isa Manuel, continued to do well without any seizure activity, continued on Keppra  1000mg  BID    Interval history:  Has been doing well since prior visit. Denies any seizures. Continue to tolerate keppra  1000mg  twice daily, denies side effects.  Continues to maintain ADLs and IADLs independently as well as driving. He is still required to submit forms for [facility] for medical review program every 2 years. Ambulates with AD, no falls over the past year.  Routinely follows with PCP, recent labs satisfactory. Routinely follows with cardiology for history of PAD, CAD and AAA, remains on aspirin , Pletal , atorvastatin , carvedilol  and lisinopril .       REVIEW OF SYSTEMS: Full 14 system review of systems performed and notable only for those  listed, all others are neg:  No complaints   ALLERGIES: No Known Allergies  HOME MEDICATIONS: Outpatient Medications Prior to Visit  Medication Sig Dispense Refill   albuterol  (VENTOLIN  HFA) 108 (90 Base) MCG/ACT inhaler Inhale 2 puffs into the lungs every 6 (six) hours as needed for wheezing or shortness of breath. 8 g 1   Ascorbic Acid (VITAMIN C PO) Take 1 tablet by mouth.     aspirin  EC 81 MG tablet Take 81 mg by mouth daily.     atorvastatin  (LIPITOR ) 80 MG tablet  TAKE 1 TABLET DAILY 90 tablet 2   carvedilol  (COREG ) 3.125 MG tablet TAKE 1 TABLET TWICE A DAY  WITH MEALS 180 tablet 3   cilostazol  (PLETAL ) 100 MG tablet TAKE 1 TABLET TWICE A DAY 180 tablet 3   levETIRAcetam  (KEPPRA ) 1000 MG tablet Take 1 tablet (1,000 mg total) by mouth 2 (two) times daily. 180 tablet 4   levothyroxine  (SYNTHROID ) 125 MCG tablet Take 1 tablet (125 mcg total) by mouth daily. 90 tablet 3   lisinopril  (ZESTRIL ) 2.5 MG tablet TAKE 1 TABLET DAILY 90 tablet 3   meloxicam (MOBIC) 15 MG tablet Take 15 mg by mouth daily.     metFORMIN  (GLUCOPHAGE ) 1000 MG tablet TAKE 1 TABLET TWICE DAILY  WITH MEALS 180 tablet 1   omeprazole  (PRILOSEC) 40 MG capsule TAKE 1 CAPSULE DAILY 90 capsule 3   pioglitazone  (ACTOS ) 45 MG tablet TAKE 1 TABLET DAILY 90 tablet 3   Semaglutide , 1 MG/DOSE, 4 MG/3ML SOPN Inject 1 mg as directed once a week. 9 mL 3   sitaGLIPtin  (JANUVIA ) 100 MG tablet Take 1 tablet (100 mg total) by mouth daily. 90 tablet 3   tamsulosin  (FLOMAX ) 0.4 MG CAPS capsule Take 0.4 mg by mouth daily.     triamcinolone  cream (KENALOG ) 0.1 %      vitamin B-12 (CYANOCOBALAMIN ) 1000 MCG tablet Take 1 tablet (1,000 mcg total) by mouth daily. 90 tablet 1   No facility-administered medications prior to visit.  PAST MEDICAL HISTORY: Past Medical History:  Diagnosis Date   Allergy    Arthritis    CAD (coronary artery disease)    Erectile dysfunction    History of colon polyps    History of diverticulitis    HYPERTENSION    Hypothyroidism    Kidney stones    passed it   Melanoma (HCC) 12/10/2019   Peripheral arterial disease (HCC)    SAH (subarachnoid hemorrhage) (HCC)    Following syncopal episode   SDH (subdural hematoma) (HCC)    Following syncopal episode   Seizures (HCC)    08/2014 - controlled   Sleep apnea    mild  does not use CPAP   Sleep apnea in adult    Syncope and collapse    Type II diabetes mellitus (HCC)    type 2    PAST SURGICAL HISTORY: Past  Surgical History:  Procedure Laterality Date   COLONOSCOPY  10/14/2012   polyp   CORONARY ANGIOPLASTY WITH STENT PLACEMENT  05/11/2014   1   CRANIOTOMY Left 09/12/2014   Procedure: CRANIOTOMY HEMATOMA EVACUATION SUBDURAL;  Surgeon: Manya Sells, MD;  Location: [facility];  Service: Neurosurgery;  Laterality: Left;   EXTRACORPOREAL SHOCK WAVE LITHOTRIPSY Left 01/08/2021   Procedure: EXTRACORPOREAL SHOCK WAVE LITHOTRIPSY (ESWL);  Surgeon: Florencio Hunting, MD;  Location: [facility];  Service: Urology;  Laterality: Left;   FRACTIONAL FLOW RESERVE WIRE  05/11/2014   Procedure: FRACTIONAL FLOW RESERVE WIRE;  Surgeon: Mickiel Albany, MD;  Location: [facility] CATH LAB;  Service: Cardiovascular;;   LEFT HEART CATHETERIZATION WITH CORONARY ANGIOGRAM N/A 05/11/2014   Procedure: LEFT HEART CATHETERIZATION WITH CORONARY ANGIOGRAM;  Surgeon: Mickiel Albany, MD;  Location[facility] C[facility] LAB;  Service: Cardiovascular;  Laterality: N/A;   POLYPECTOMY      FAMILY HISTORY: Family History  Problem Relation Age of Onset   Lung cancer Father        dad was a smoker   Heart disease Father    AAA (abdominal aortic aneurysm) Father    Heart disease Mother    Ataxia Maternal Grandmother    Dementia Maternal Grandmother    Aplastic anemia Sister    Prostate cancer Brother    Lung cancer Cousin    Colon cancer Neg Hx    Rectal cancer Neg Hx    Stomach cancer Neg Hx    Colon polyps Neg Hx    Esophageal cancer Neg Hx     SOCIAL HISTORY: Social History   Socioeconomic History   Marital status: Married    Spouse name: Ave Leisure   Number of children: 3   Years of education: 12   Highest education level: 12th grade  Occupational History   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)    Employer: [employer]   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[place]    Occupation: [phone] (CELL)    Employer: Producer, television/film/video metals   Occupation: retired  Tobacco Use   Smoking status: Former    Current  packs/day: 0.00    Average packs/day: 1 pack/day for 30.0 years (30.0 ttl pk-yrs)    Types: Cigarettes    Start date: 10/14/1953    Quit date: 10/15/1983    Years since quitting: 40.4   Smokeless tobacco: Never  Vaping Use   Vaping status: Never Used  Substance and Sexual Activity   Alcohol use: No   Drug use: No   Sexual activity: Yes  Other Topics Concern   Not on file  Social  History Narrative   Patient is married with 3 daughters 63, 70, 43 and 2 grandchildren.   Patient is right handed.   Patient has hs education.   Patient drinks decaf, and soda occasionally.   Social Drivers of Corporate investment banker Strain: Low Risk  (03/18/2024)   Overall Financial Resource Strain (CARDIA)    Difficulty of Paying Living Expenses: Not hard at all  Food Insecurity: No Food Insecurity (03/18/2024)   Hunger Vital Sign    Worried About Running Out of Food in the Last Year: Never true    Ran Out of Food in the Last Year: Never true  Transportation Needs: No Transportation Needs (03/18/2024)   PRAPARE - Administrator, Civil Service (Medical): No    Lack of Transportation (Non-Medical): No  Physical Activity: Sufficiently Active (03/18/2024)   Exercise Vital Sign    Days of Exercise per Week: 4 days    Minutes of Exercise per Session: 50 min  Stress: No Stress Concern Present (03/18/2024)   [employer] of Occupational Health - Occupational Stress Questionnaire    Feeling of Stress : Not at all  Social Connections: Socially Integrated (03/18/2024)   Social Connection and Isolation Panel    Frequency of Communication with Friends and Family: Twice a week    Frequency of Social Gatherings with Friends and Family: Twice a week    Attends Religious Services: More than 4 times per year    Active Member of [employer] or Organizations: Yes    Attends Engineer, structural: More than 4 times per year    Marital Status: Married  Catering manager Violence: Not At Risk (03/18/2024)    Humiliation, Afraid, Rape, and Kick questionnaire    Fear of Current or Ex-Partner: No    Emotionally Abused: No    Physically Abused: No    Sexually Abused: No       Vital signs Today's Vitals   03/25/24 1310  BP: 99/61  Pulse: 86  Weight: 246 lb (111.6 kg)  Height: 6' 1 (1.854 m)   Body mass index is 32.46 kg/m.  Physical exam General: well developed, well nourished, very pleasant elderly Caucasian male, seated, in no evident distress  Neurologic Exam Mental Status: Awake and fully alert.   Fluent speech and language.  Oriented to place and time. Recent memory mildly impaired and remote memory intact. Attention span, concentration and fund of knowledge appropriate. Mood and affect appropriate.  Cranial Nerves: Pupils equal, briskly reactive to light. Extraocular movements full without nystagmus. Visual fields full to confrontation. Hearing intact. Facial sensation intact. Face, tongue, palate moves normally and symmetrically.  Motor: Normal bulk and tone. Normal strength in all tested extremity muscles. Sensory.: intact to touch , pinprick , position and vibratory sensation.  Coordination: Rapid alternating movements normal in all extremities. Finger-to-nose and heel-to-shin performed accurately bilaterally. Gait and Station: Arises from chair without difficulty. Stance is normal. Gait demonstrates normal stride length and balance without use of assistive device Reflexes: 1+ and symmetric. Toes downgoing.         ASSESSMENT AND PLAN 78 y.o. Caucasian male with PMH of CAD s/p stent in 04/2014 on ASA , right SDH s/p fall with seizure in 03/2014 put on keppra  was admitted again in 08/2014 for left SDH s/p fall. Had slight enlargement of left SDH over days and frequent focal seizure with aphasia and right cheek numbness. Increased keppra  dose to 1000mg  bid. Had SDH evacuation on 09/12/14. In 09/2014, repeat  CT showed evolving left SDH.     Plan:   1.  Seizures post traumatic  R SDH -Doing well without any seizure activity -last reported seizure 2015 -continue keppra  1000mg  bid for seizure prophylaxis -refill provided -discussed potentially lowering dose as seizure-free over the past 10 years but declines interest at this time, he was advised to call if interested in pursuing in the future -Information provided to request removal from [facility] medical review program -CMP and CBC completed by PCP 01/2024 -Discussed importance of avoiding seizure provoking triggers and activities     Follow-up in 1 year or call earlier if needed   CC:  Roslyn Coombe, MD     I personally spent a total of 25 minutes in the care of the patient today including preparing to see the patient, performing a medically appropriate exam/evaluation, counseling and educating, placing orders, and documenting clinical information in the EHR.   Johny Nap, AGNP-BC  [facility] [address] [place], [place] [address]  Phone [phone] Fax [phone] Note: This document was prepared with digital dictation and possible smart phrase technology. Any transcriptional errors that result from this process are unintentional.

## 2024-03-25 ENCOUNTER — Encounter: Payer: Self-pay | Admitting: Adult Health

## 2024-03-25 ENCOUNTER — Ambulatory Visit: Payer: Medicare Other | Admitting: Adult Health

## 2024-03-25 VITALS — BP 99/61 | HR 86 | Ht 73.0 in | Wt 246.0 lb

## 2024-03-25 DIAGNOSIS — S065XAA Traumatic subdural hemorrhage with loss of consciousness status unknown, initial encounter: Secondary | ICD-10-CM

## 2024-03-25 DIAGNOSIS — R569 Unspecified convulsions: Secondary | ICD-10-CM

## 2024-03-25 MED ORDER — LEVETIRACETAM 1000 MG PO TABS: 1000.0000 mg | ORAL_TABLET | Freq: Two times a day (BID) | ORAL | 4 refills | Status: AC

## 2024-03-25 NOTE — Patient Instructions (Addendum)
 Your Plan:  Continue keppra  1000mg  twice daily for seizure prevention  Please call with any seizure activity   Follow-up in 1 year or call earlier if needed    Thank you for coming to see us  at [facility]. I hope we have been able to provide you high quality care today.  You may receive a patient satisfaction survey over the next few weeks. We would appreciate your feedback and comments so that we may continue to improve ourselves and the health of our patients.

## 2024-04-06 ENCOUNTER — Ambulatory Visit (INDEPENDENT_AMBULATORY_CARE_PROVIDER_SITE_OTHER): Admitting: Podiatry

## 2024-04-06 ENCOUNTER — Encounter: Payer: Self-pay | Admitting: Podiatry

## 2024-04-06 DIAGNOSIS — M79671 Pain in right foot: Secondary | ICD-10-CM

## 2024-04-06 DIAGNOSIS — B351 Tinea unguium: Secondary | ICD-10-CM

## 2024-04-06 DIAGNOSIS — E1151 Type 2 diabetes mellitus with diabetic peripheral angiopathy without gangrene: Secondary | ICD-10-CM | POA: Diagnosis not present

## 2024-04-06 DIAGNOSIS — M79672 Pain in left foot: Secondary | ICD-10-CM

## 2024-04-06 DIAGNOSIS — I70209 Unspecified atherosclerosis of native arteries of extremities, unspecified extremity: Secondary | ICD-10-CM

## 2024-04-06 NOTE — Progress Notes (Signed)
 Patient presents for evaluation and treatment of tenderness and some redness around nails feet.  Tenderness around toes with walking and wearing shoes.  Physical exam:  General appearance: Alert, pleasant, and in no acute distress.  Vascular: Pedal pulses: DP 2/4 B/L, PT 0/4 B/L.  Moderate edema lower legs bilaterally  Neurological:  Some tingling in the feet at times.  No burning.  Dermatologic:  Nails thickened, disfigured, discolored 1-5 BL with subungual debris.  Redness and hypertrophic nail folds along nail folds bilaterally but no signs of drainage or infection.  No hair growth on the lower extremity.  Areas of hyperpigmentation lower extremity bilaterally  Musculoskeletal:     Diagnosis: 1. Painful onychomycotic nails 1 through 5 bilaterally. 2. Pain toes 1 through 5 bilaterally.   3.  Diabetes mellitus type 2 with PVD  Plan: Debrided onychomycotic nails 1 through 5 bilaterally.  Return 3 months
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
## 2024-04-20 ENCOUNTER — Encounter: Payer: Self-pay | Admitting: Cardiology

## 2024-04-20 ENCOUNTER — Ambulatory Visit: Attending: Cardiology | Admitting: Cardiology

## 2024-04-20 VITALS — BP 102/48 | HR 68 | Ht 73.0 in | Wt 246.0 lb

## 2024-04-20 DIAGNOSIS — I714 Abdominal aortic aneurysm, without rupture, unspecified: Secondary | ICD-10-CM | POA: Diagnosis not present

## 2024-04-20 DIAGNOSIS — I2510 Atherosclerotic heart disease of native coronary artery without angina pectoris: Secondary | ICD-10-CM | POA: Insufficient documentation

## 2024-04-20 DIAGNOSIS — E785 Hyperlipidemia, unspecified: Secondary | ICD-10-CM | POA: Diagnosis not present

## 2024-04-20 DIAGNOSIS — I119 Hypertensive heart disease without heart failure: Secondary | ICD-10-CM | POA: Insufficient documentation

## 2024-04-20 NOTE — Patient Instructions (Signed)
 Medication Instructions:   STOP LISINOPRIL   *If you need a refill on your cardiac medications before your next appointment, please call your pharmacy*   Follow-Up: At [facility], you and your health needs are our priority.  As part of our continuing mission to provide you with exceptional heart care, our providers are all part of one team.  This team includes your primary Cardiologist (physician) and Advanced Practice Providers or APPs (Physician Assistants and Nurse Practitioners) who all work together to provide you with the care you need, when you need it.  Your next appointment:   12 month(s)  Provider:   Redell Shallow, MD

## 2024-04-28 ENCOUNTER — Other Ambulatory Visit: Payer: Self-pay | Admitting: Internal Medicine

## 2024-05-03 DIAGNOSIS — C44622 Squamous cell carcinoma of skin of right upper limb, including shoulder: Secondary | ICD-10-CM | POA: Diagnosis not present

## 2024-05-13 DIAGNOSIS — C44529 Squamous cell carcinoma of skin of other part of trunk: Secondary | ICD-10-CM | POA: Diagnosis not present

## 2024-05-13 DIAGNOSIS — C44622 Squamous cell carcinoma of skin of right upper limb, including shoulder: Secondary | ICD-10-CM | POA: Diagnosis not present

## 2024-05-13 DIAGNOSIS — Z85828 Personal history of other malignant neoplasm of skin: Secondary | ICD-10-CM | POA: Diagnosis not present

## 2024-07-12 DIAGNOSIS — N401 Enlarged prostate with lower urinary tract symptoms: Secondary | ICD-10-CM | POA: Diagnosis not present

## 2024-07-12 DIAGNOSIS — N200 Calculus of kidney: Secondary | ICD-10-CM | POA: Diagnosis not present

## 2024-07-12 DIAGNOSIS — R351 Nocturia: Secondary | ICD-10-CM | POA: Diagnosis not present

## 2024-07-13 ENCOUNTER — Ambulatory Visit (INDEPENDENT_AMBULATORY_CARE_PROVIDER_SITE_OTHER): Admitting: Podiatry

## 2024-07-13 ENCOUNTER — Encounter: Payer: Self-pay | Admitting: Podiatry

## 2024-07-13 VITALS — Ht 73.0 in | Wt 246.0 lb

## 2024-07-13 DIAGNOSIS — B351 Tinea unguium: Secondary | ICD-10-CM | POA: Diagnosis not present

## 2024-07-13 DIAGNOSIS — M79671 Pain in right foot: Secondary | ICD-10-CM

## 2024-07-13 DIAGNOSIS — M79672 Pain in left foot: Secondary | ICD-10-CM | POA: Diagnosis not present

## 2024-07-13 NOTE — Progress Notes (Signed)
 Patient presents for evaluation and treatment of tenderness and some redness around nails feet.  Tenderness around toes with walking and wearing shoes.  Physical exam:  General appearance: Alert, pleasant, and in no acute distress.  Vascular: Pedal pulses: DP 2/4 B/L, PT 0/4 B/L. Moderate edema lower legs bilaterally  Neu  Dermatologic:  Nails thickened, disfigured, discolored 1-5 BL with subungual debris.  Redness and hypertrophic nail folds along nail folds bilaterally but no signs of drainage or infection.  Musculoskeletal:     Diagnosis: 1. Painful onychomycotic nails 1 through 5 bilaterally. 2. Pain toes 1 through 5 bilaterally.  Plan: -Debrided onychomycotic nails 1 through 5 bilaterally.  Sharply debrided nails with nail clipper and reduced with a power bur.  Return 4 months [facility]

## 2024-07-22 ENCOUNTER — Ambulatory Visit: Admitting: Internal Medicine

## 2024-07-26 ENCOUNTER — Ambulatory Visit: Admitting: Internal Medicine

## 2024-07-26 ENCOUNTER — Ambulatory Visit: Payer: Self-pay | Admitting: Internal Medicine

## 2024-07-26 ENCOUNTER — Encounter: Payer: Self-pay | Admitting: Internal Medicine

## 2024-07-26 VITALS — BP 122/78 | HR 65 | Temp 98.0°F | Ht 73.0 in | Wt 238.0 lb

## 2024-07-26 DIAGNOSIS — E559 Vitamin D deficiency, unspecified: Secondary | ICD-10-CM | POA: Diagnosis not present

## 2024-07-26 DIAGNOSIS — Z23 Encounter for immunization: Secondary | ICD-10-CM | POA: Diagnosis not present

## 2024-07-26 DIAGNOSIS — E039 Hypothyroidism, unspecified: Secondary | ICD-10-CM | POA: Diagnosis not present

## 2024-07-26 DIAGNOSIS — Z7985 Long-term (current) use of injectable non-insulin antidiabetic drugs: Secondary | ICD-10-CM | POA: Diagnosis not present

## 2024-07-26 DIAGNOSIS — E7800 Pure hypercholesterolemia, unspecified: Secondary | ICD-10-CM

## 2024-07-26 DIAGNOSIS — M170 Bilateral primary osteoarthritis of knee: Secondary | ICD-10-CM

## 2024-07-26 DIAGNOSIS — Z7984 Long term (current) use of oral hypoglycemic drugs: Secondary | ICD-10-CM | POA: Diagnosis not present

## 2024-07-26 DIAGNOSIS — H1013 Acute atopic conjunctivitis, bilateral: Secondary | ICD-10-CM | POA: Diagnosis not present

## 2024-07-26 DIAGNOSIS — E538 Deficiency of other specified B group vitamins: Secondary | ICD-10-CM

## 2024-07-26 DIAGNOSIS — E119 Type 2 diabetes mellitus without complications: Secondary | ICD-10-CM | POA: Diagnosis not present

## 2024-07-26 DIAGNOSIS — R233 Spontaneous ecchymoses: Secondary | ICD-10-CM

## 2024-07-26 LAB — CBC WITH DIFFERENTIAL/PLATELET
Basophils Absolute: 0.1 K/uL (ref 0.0–0.1)
Basophils Relative: 0.8 % (ref 0.0–3.0)
Eosinophils Absolute: 0.3 K/uL (ref 0.0–0.7)
Eosinophils Relative: 3.5 % (ref 0.0–5.0)
HCT: 43.8 % (ref 39.0–52.0)
Hemoglobin: 14.4 g/dL (ref 13.0–17.0)
Lymphocytes Relative: 20.7 % (ref 12.0–46.0)
Lymphs Abs: 1.6 K/uL (ref 0.7–4.0)
MCHC: 32.9 g/dL (ref 30.0–36.0)
MCV: 91.5 fl (ref 78.0–100.0)
Monocytes Absolute: 0.7 K/uL (ref 0.1–1.0)
Monocytes Relative: 9 % (ref 3.0–12.0)
Neutro Abs: 5 K/uL (ref 1.4–7.7)
Neutrophils Relative %: 66 % (ref 43.0–77.0)
Platelets: 135 K/uL — ABNORMAL LOW (ref 150.0–400.0)
RBC: 4.79 Mil/uL (ref 4.22–5.81)
RDW: 15 % (ref 11.5–15.5)
WBC: 7.5 K/uL (ref 4.0–10.5)

## 2024-07-26 LAB — BASIC METABOLIC PANEL WITH GFR
BUN: 23 mg/dL (ref 6–23)
CO2: 28 meq/L (ref 19–32)
Calcium: 9.5 mg/dL (ref 8.4–10.5)
Chloride: 102 meq/L (ref 96–112)
Creatinine, Ser: 0.99 mg/dL (ref 0.40–1.50)
GFR: 73.1 mL/min (ref >60.00)
Glucose, Bld: 136 mg/dL — ABNORMAL HIGH (ref 70–99)
Potassium: 4.4 meq/L (ref 3.5–5.1)
Sodium: 138 meq/L (ref 135–145)

## 2024-07-26 LAB — URINALYSIS, ROUTINE W REFLEX MICROSCOPIC
Bilirubin Urine: NEGATIVE
Hgb urine dipstick: NEGATIVE
Ketones, ur: NEGATIVE
Leukocytes,Ua: NEGATIVE
Nitrite: NEGATIVE
RBC / HPF: NONE SEEN (ref 0–?)
Specific Gravity, Urine: 1.025 (ref 1.000–1.030)
Total Protein, Urine: NEGATIVE
Urine Glucose: NEGATIVE
Urobilinogen, UA: 1 (ref 0.0–1.0)
pH: 6 (ref 5.0–8.0)

## 2024-07-26 LAB — LIPID PANEL
Cholesterol: 109 mg/dL (ref 0–200)
HDL: 46 mg/dL (ref >39.00)
LDL Cholesterol: 38 mg/dL (ref 0–99)
NonHDL: 63.08
Total CHOL/HDL Ratio: 2
Triglycerides: 123 mg/dL (ref 0.0–149.0)
VLDL: 24.6 mg/dL (ref 0.0–40.0)

## 2024-07-26 LAB — HEPATIC FUNCTION PANEL
ALT: 16 U/L (ref 0–53)
AST: 15 U/L (ref 0–37)
Albumin: 4.5 g/dL (ref 3.5–5.2)
Alkaline Phosphatase: 122 U/L — ABNORMAL HIGH (ref 39–117)
Bilirubin, Direct: 0.1 mg/dL (ref 0.0–0.3)
Total Bilirubin: 0.5 mg/dL (ref 0.2–1.2)
Total Protein: 6.9 g/dL (ref 6.0–8.3)

## 2024-07-26 LAB — MICROALBUMIN / CREATININE URINE RATIO
Creatinine,U: 120 mg/dL
Microalb Creat Ratio: 12.3 mg/g (ref 0.0–30.0)
Microalb, Ur: 1.5 mg/dL (ref 0.0–1.9)

## 2024-07-26 LAB — HEMOGLOBIN A1C: Hgb A1c MFr Bld: 6.3 % (ref 4.6–6.5)

## 2024-07-26 LAB — VITAMIN D 25 HYDROXY (VIT D DEFICIENCY, FRACTURES): VITD: 32.47 ng/mL (ref 30.00–100.00)

## 2024-07-26 LAB — PROTIME-INR
INR: 1.1 ratio — ABNORMAL HIGH (ref 0.8–1.0)
Prothrombin Time: 11.3 s (ref 9.6–13.1)

## 2024-07-26 LAB — VITAMIN B12: Vitamin B-12: 515 pg/mL (ref 211–911)

## 2024-07-26 NOTE — Progress Notes (Unsigned)
 Patient ID: Allen Myers, male   DOB: 05-May-1946, 78 y.o.   MRN: [id number]        Chief Complaint: follow up HTN, HLD, DM, easy bruising, low thyroid , low vit D and b12, eye allergies, bilat anterior knee pain       HPI:  Allen Myers is a 78 y.o. male here overall doing ok, Pt denies chest pain, increased sob or doe, wheezing, orthopnea, PND, increased LE swelling, palpitations, dizziness or syncope.  Has bruising to distal arms but also new surprising bruise to upper abd without trauma he is aware.  Tolerating lower dose levothryoxine now at 125 mcg every day,  Does have itching redness slight swelling of the bilateral upper and lower eyelids, hard to stop rubbing.  Also has several wks of right > left anterior knee pain only with going down stairs, none with walking, but feels more weak and has slight increased fear of falling. Due for flu shot       Wt Readings from Last 3 Encounters:  07/26/24 238 lb (108 kg)  07/13/24 246 lb (111.6 kg)  04/20/24 246 lb (111.6 kg)   BP Readings from Last 3 Encounters:  07/26/24 122/78  04/20/24 (!) 102/48  03/25/24 99/61         Past Medical History:  Diagnosis Date   Allergy    Arthritis    CAD (coronary artery disease)    Erectile dysfunction    History of colon polyps    History of diverticulitis    HYPERTENSION    Hypothyroidism    Kidney stones    passed it   Melanoma (HCC) 12/10/2019   Peripheral arterial disease    SAH (subarachnoid hemorrhage) (HCC)    Following syncopal episode   SDH (subdural hematoma) (HCC)    Following syncopal episode   Seizures (HCC)    08/2014 - controlled   Sleep apnea    mild  does not use CPAP   Sleep apnea in adult    Syncope and collapse    Type II diabetes mellitus (HCC)    type 2   Past Surgical History:  Procedure Laterality Date   COLONOSCOPY  10/14/2012   polyp   CORONARY ANGIOPLASTY WITH STENT PLACEMENT  05/11/2014   1   CRANIOTOMY Left 09/12/2014   Procedure: CRANIOTOMY  HEMATOMA EVACUATION SUBDURAL;  Surgeon: Fairy Levels, MD;  Location: [facility];  Service: Neurosurgery;  Laterality: Left;   EXTRACORPOREAL SHOCK WAVE LITHOTRIPSY Left 01/08/2021   Procedure: EXTRACORPOREAL SHOCK WAVE LITHOTRIPSY (ESWL);  Surgeon: Renda Glance, MD;  Location: [facility];  Service: Urology;  Laterality: Left;   FRACTIONAL FLOW RESERVE WIRE  05/11/2014   Procedure: FRACTIONAL FLOW RESERVE WIRE;  Surgeon: Victory LELON Claudene DOUGLAS, MD;  Location: [facility] CATH LAB;  Service: Cardiovascular;;   LEFT HEART CATHETERIZATION WITH CORONARY ANGIOGRAM N/A 05/11/2014   Procedure: LEFT HEART CATHETERIZATION WITH CORONARY ANGIOGRAM;  Surgeon: Victory LELON Claudene DOUGLAS, MD;  Location: [facility] CATH LAB;  Service: Cardiovascular;  Laterality: N/A;   POLYPECTOMY      reports that he quit smoking about 40 years ago. His smoking use included cigarettes. He started smoking about 70 years ago. He has a 30 pack-year smoking history. He has never used smokeless tobacco. He reports that he does not drink alcohol and does not use drugs. family history includes AAA (abdominal aortic aneurysm) in his father; Aplastic anemia in his sister; Ataxia in his maternal grandmother; Dementia in his maternal grandmother; Heart  disease in his father and mother; Lung cancer in his cousin and father; Prostate cancer in his brother. No Known Allergies Current Outpatient Medications on File Prior to Visit  Medication Sig Dispense Refill   albuterol  (VENTOLIN  HFA) 108 (90 Base) MCG/ACT inhaler Inhale 2 puffs into the lungs every 6 (six) hours as needed for wheezing or shortness of breath. 8 g 1   Ascorbic Acid (VITAMIN C PO) Take 1 tablet by mouth.     aspirin  EC 81 MG tablet Take 81 mg by mouth daily.     atorvastatin  (LIPITOR ) 80 MG tablet TAKE 1 TABLET DAILY 90 tablet 2   cilostazol  (PLETAL ) 100 MG tablet TAKE 1 TABLET TWICE A DAY 180 tablet 3   levETIRAcetam  (KEPPRA ) 1000 MG tablet Take 1 tablet (1,000 mg total) by mouth 2  (two) times daily. 180 tablet 4   levothyroxine  (SYNTHROID ) 125 MCG tablet Take 1 tablet (125 mcg total) by mouth daily. 90 tablet 3   meloxicam (MOBIC) 15 MG tablet Take 15 mg by mouth daily.     metFORMIN  (GLUCOPHAGE ) 1000 MG tablet TAKE 1 TABLET TWICE DAILY  WITH MEALS 180 tablet 1   omeprazole  (PRILOSEC) 40 MG capsule TAKE 1 CAPSULE DAILY 90 capsule 3   pioglitazone  (ACTOS ) 45 MG tablet TAKE 1 TABLET DAILY 90 tablet 3   Semaglutide , 1 MG/DOSE, 4 MG/3ML SOPN Inject 1 mg as directed once a week. 9 mL 3   sitaGLIPtin  (JANUVIA ) 100 MG tablet Take 1 tablet (100 mg total) by mouth daily. 90 tablet 3   tamsulosin  (FLOMAX ) 0.4 MG CAPS capsule Take 0.4 mg by mouth daily.     triamcinolone  cream (KENALOG ) 0.1 %      vitamin B-12 (CYANOCOBALAMIN ) 1000 MCG tablet Take 1 tablet (1,000 mcg total) by mouth daily. 90 tablet 1   No current facility-administered medications on file prior to visit.        ROS:  All others reviewed and negative.  Objective        PE:  BP 122/78 (BP Location: Right Arm, Patient Position: Sitting, Cuff Size: Normal)   Pulse 65   Temp 98 F (36.7 C) (Oral)   Ht 6' 1 (1.854 m)   Wt 238 lb (108 kg)   SpO2 93%   BMI 31.40 kg/m                 Constitutional: Pt appears in NAD               HENT: Head: NCAT.                Right Ear: External ear normal.                 Left Ear: External ear normal.                Eyes: . Pupils are equal, round, and reactive to light. Conjunctivae and eyelids upper lower bilat with nontender mild redness, and EOM are normal               Nose: without d/c or deformity               Neck: Neck supple. Gross normal ROM               Cardiovascular: Normal rate and regular rhythm.                 Pulmonary/Chest: Effort normal and breath sounds without rales or wheezing.  Abd:  Soft, NT, ND, + BS, no organomegaly               Neurological: Pt is alert. At baseline orientation, motor grossly intact               Skin:  Skin is warm. No rashes, also has several small bruising to distal arms and mid upper abdomen nontender,, LE edema - none; bilat knees with mild bony degenerative changes, no effusion or tenderness               Psychiatric: Pt behavior is normal without agitation   Micro: none  Cardiac tracings I have personally interpreted today:  none  Pertinent Radiological findings (summarize): none   Lab Results  Component Value Date   WBC 7.5 07/26/2024   HGB 14.4 07/26/2024   HCT 43.8 07/26/2024   PLT 135.0 (L) 07/26/2024   GLUCOSE 136 (H) 07/26/2024   CHOL 109 07/26/2024   TRIG 123.0 07/26/2024   HDL 46.00 07/26/2024   LDLDIRECT 85.7 06/11/2013   LDLCALC 38 07/26/2024   ALT 16 07/26/2024   AST 15 07/26/2024   NA 138 07/26/2024   K 4.4 07/26/2024   CL 102 07/26/2024   CREATININE 0.99 07/26/2024   BUN 23 07/26/2024   CO2 28 07/26/2024   TSH 0.41 07/26/2024   PSA 2.54 07/17/2023   INR 1.1 (H) 07/26/2024   HGBA1C 6.3 07/26/2024   MICROALBUR 1.5 07/26/2024   Assessment/Plan:  Allen Myers is a 78 y.o. White or Caucasian [1] male with  has a past medical history of Allergy, Arthritis, CAD (coronary artery disease), Erectile dysfunction, History of colon polyps, History of diverticulitis, HYPERTENSION, Hypothyroidism, Kidney stones, Melanoma (HCC) (12/10/2019), Peripheral arterial disease, SAH (subarachnoid hemorrhage) (HCC), SDH (subdural hematoma) (HCC), Seizures (HCC), Sleep apnea, Sleep apnea in adult, Syncope and collapse, and Type II diabetes mellitus (HCC).  Vitamin D  deficiency Last vitamin D  Lab Results  Component Value Date   VD25OH 32.47 07/26/2024   Low, to start oral replacement   Hypothyroidism Lab Results  Component Value Date   TSH 0.41 07/26/2024   Stable, pt to continue levothyroxine  125 mcg qd   HLD (hyperlipidemia) Lab Results  Component Value Date   LDLCALC 38 07/26/2024   Stable, pt to continue current statin lipitor  80 mg qd   Diabetes mellitus  type 2, noninsulin dependent (HCC) Lab Results  Component Value Date   HGBA1C 6.3 07/26/2024   Stable, pt to continue current medical treatment metformin  1000 bid, actos  45 mg every day, januvia  100 mg every day,  ozempic  1 mg weekly   B12 deficiency Lab Results  Component Value Date   VITAMINB12 515 07/26/2024   Stable, cont oral replacement - b12 1000 mcg qd   Allergic conjunctivitis Mild, for otc zaditor asd,  to f/u any worsening symptoms or concerns  Degenerative arthritis of knee, bilateral With mild pain only going downs stairs, pt declines PT or orthopedic for now  Easy bruising I suspect most likely post traumatic but for PT INR per pt reqeust  Followup: Return in about 6 months (around 01/24/2025).  Lynwood Norleen, MD 07/29/2024 6:50 AM [facility]

## 2024-07-26 NOTE — Patient Instructions (Addendum)
 You had the flu shot today  Ok to take the OTC Zaditor for eye allergy (or even nasacort  OTC can hlep eyes as well)  Please call if you would want to try Physical Therapy, or see Sports Medicine  Please continue all other medications as before, and refills have been done if requested.  Please have the pharmacy call with any other refills you may need.  Please continue your efforts at being more active, low cholesterol diet, and weight control.  Please keep your appointments with your specialists as you may have planned - Cardiology, Neurology  Please go to the LAB at the blood drawing area for the tests to be done  You will be contacted by phone if any changes need to be made immediately.  Otherwise, you will receive a letter about your results with an explanation, but please check with MyChart first.  Please make an Appointment to return in 6 months, or sooner if needed

## 2024-07-27 ENCOUNTER — Other Ambulatory Visit: Payer: Self-pay | Admitting: Internal Medicine

## 2024-07-27 LAB — THYROID PANEL WITH TSH
Free Thyroxine Index: 2.8 (ref 1.4–3.8)
T3 Uptake: 36 % — ABNORMAL HIGH (ref 22–35)
T4, Total: 7.9 ug/dL (ref 4.9–10.5)
TSH: 0.41 m[IU]/L (ref 0.40–4.50)

## 2024-07-27 NOTE — Progress Notes (Signed)
The test results show that your current treatment is OK, as the tests are stable and essentially normal.  Please continue the same plan.  There is no other need for change of treatment or further evaluation based on these results, at this time.  thanks

## 2024-07-29 ENCOUNTER — Encounter: Payer: Self-pay | Admitting: Internal Medicine

## 2024-07-29 DIAGNOSIS — R233 Spontaneous ecchymoses: Secondary | ICD-10-CM | POA: Insufficient documentation

## 2024-07-29 DIAGNOSIS — H1010 Acute atopic conjunctivitis, unspecified eye: Secondary | ICD-10-CM | POA: Insufficient documentation

## 2024-07-29 NOTE — Assessment & Plan Note (Signed)
 I suspect most likely post traumatic but for PT INR per pt reqeust

## 2024-07-29 NOTE — Assessment & Plan Note (Signed)
 Lab Results  Component Value Date   HGBA1C 6.3 07/26/2024   Stable, pt to continue current medical treatment metformin  1000 bid, actos  45 mg every day, januvia  100 mg every day,  ozempic  1 mg weekly

## 2024-07-29 NOTE — Assessment & Plan Note (Signed)
 With mild pain only going downs stairs, pt declines PT or orthopedic for now

## 2024-07-29 NOTE — Assessment & Plan Note (Signed)
 Last vitamin D  Lab Results  Component Value Date   VD25OH 32.47 07/26/2024   Low, to start oral replacement

## 2024-07-29 NOTE — Assessment & Plan Note (Signed)
 Mild, for otc zaditor asd,  to f/u any worsening symptoms or concerns

## 2024-07-29 NOTE — Assessment & Plan Note (Signed)
 Lab Results  Component Value Date   LDLCALC 38 07/26/2024   Stable, pt to continue current statin lipitor  80 mg qd

## 2024-07-29 NOTE — Assessment & Plan Note (Signed)
 Lab Results  Component Value Date   TSH 0.41 07/26/2024   Stable, pt to continue levothyroxine  125 mcg qd

## 2024-07-29 NOTE — Assessment & Plan Note (Signed)
 Lab Results  Component Value Date   VITAMINB12 515 07/26/2024   Stable, cont oral replacement - b12 1000 mcg qd

## 2024-08-15 ENCOUNTER — Other Ambulatory Visit: Payer: Self-pay | Admitting: Internal Medicine

## 2024-08-16 ENCOUNTER — Other Ambulatory Visit: Payer: Self-pay

## 2024-08-21 ENCOUNTER — Other Ambulatory Visit: Payer: Self-pay | Admitting: Internal Medicine

## 2024-08-31 ENCOUNTER — Other Ambulatory Visit: Payer: Self-pay | Admitting: Internal Medicine

## 2024-08-31 NOTE — Telephone Encounter (Unsigned)
 Copied from CRM [id number]. Topic: Clinical - Medication Refill >> Aug 31, 2024  3:38 PM Franky GRADE wrote: Medication: levothyroxine  (SYNTHROID ) 137 MCG tablet [Pharmacy Med Name: LEVOTHYROXIN TAB 137MCG] [[id number]]  Has the patient contacted their pharmacy? Yes (Agent: If no, request that the patient contact the pharmacy for the refill. If patient does not wish to contact the pharmacy document the reason why and proceed with request.) (Agent: If yes, when and what did the pharmacy advise?)  This is the patient's preferred pharmacy:  [facility] - [place], [place] - [address] AT Portal to Registered Caremark Sites [address] [place] [address] Phone: [phone] Fax: [phone]   Is this the correct pharmacy for this prescription? Yes If no, delete pharmacy and type the correct one.   Has the prescription been filled recently? No  Is the patient out of the medication? Yes  Has the patient been seen for an appointment in the last year OR does the patient have an upcoming appointment? Yes  Can we respond through MyChart? Yes  Agent: Please be advised that Rx refills may take up to 3 business days. We ask that you follow-up with your pharmacy.

## 2024-09-01 ENCOUNTER — Other Ambulatory Visit: Payer: Self-pay | Admitting: Internal Medicine

## 2024-09-01 ENCOUNTER — Other Ambulatory Visit: Payer: Self-pay

## 2024-09-01 MED ORDER — LEVOTHYROXINE SODIUM 125 MCG PO TABS: 125.0000 ug | ORAL_TABLET | Freq: Every day | ORAL | 3 refills | Status: AC

## 2024-09-18 ENCOUNTER — Other Ambulatory Visit: Payer: Self-pay | Admitting: Internal Medicine

## 2024-09-30 NOTE — Progress Notes (Unsigned)
 [facility] [address] [place] [address] Phone: [phone] Subjective:    I'm seeing this patient by the request  of:  Norleen Lynwood ORN, MD  CC:   YEP:[id number]  Allen Myers is a 78 y.o. male coming in with complaint of R leg pain. Last seen in 2022. Patient states      Past Medical History:  Diagnosis Date   Allergy    Arthritis    CAD (coronary artery disease)    Erectile dysfunction    History of colon polyps    History of diverticulitis    HYPERTENSION    Hypothyroidism    Kidney stones    passed it   Melanoma (HCC) 12/10/2019   Peripheral arterial disease    SAH (subarachnoid hemorrhage) (HCC)    Following syncopal episode   SDH (subdural hematoma) (HCC)    Following syncopal episode   Seizures (HCC)    08/2014 - controlled   Sleep apnea    mild  does not use CPAP   Sleep apnea in adult    Syncope and collapse    Type II diabetes mellitus (HCC)    type 2   Past Surgical History:  Procedure Laterality Date   COLONOSCOPY  10/14/2012   polyp   CORONARY ANGIOPLASTY WITH STENT PLACEMENT  05/11/2014   1   CRANIOTOMY Left 09/12/2014   Procedure: CRANIOTOMY HEMATOMA EVACUATION SUBDURAL;  Surgeon: Fairy Levels, MD;  Location: [facility];  Service: Neurosurgery;  Laterality: Left;   EXTRACORPOREAL SHOCK WAVE LITHOTRIPSY Left 01/08/2021   Procedure: EXTRACORPOREAL SHOCK WAVE LITHOTRIPSY (ESWL);  Surgeon: Renda Glance, MD;  Location: [facility];  Service: Urology;  Laterality: Left;   FRACTIONAL FLOW RESERVE WIRE  05/11/2014   Procedure: FRACTIONAL FLOW RESERVE WIRE;  Surgeon: Victory ORN Claudene DOUGLAS, MD;  Location: [facility] CATH LAB;  Service: Cardiovascular;;   LEFT HEART CATHETERIZATION WITH CORONARY ANGIOGRAM N/A 05/11/2014   Procedure: LEFT HEART CATHETERIZATION WITH CORONARY ANGIOGRAM;  Surgeon: Victory ORN Claudene DOUGLAS, MD;  Location: 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)    Employer: [employer]   Occupation: animator based in [place]    Occupation: [phone] (CELL)    Employer: producer, television/film[employer]   Occupation: retired  Tobacco Use   Smoking status: Former    Current packs/day: 0.00    Average packs/day: 1 pack/day for 30.0 years (30.0 ttl pk-yrs)    Types: Cigarettes    Start date: 10/14/1953    Quit date: 10/15/1983    Years since quitting: 40.9   Smokeless tobacco: Never  Vaping Use   Vaping status: Never Used  Substance and Sexual Activity   Alcohol use: No   Drug use: No   Sexual activity: Yes  Other Topics Concern   Not on file  Social History Narrative   Patient is married with 3 daughters 39, 52, 13 and 2 grandchildren.   Patient is right handed.   Patient has hs education.   Patient drinks decaf, and soda occasionally.   Social Drivers of Health   Tobacco Use: Medium Risk (07/29/2024)   Patient History    Smoking Tobacco Use: Former  Smokeless Tobacco Use: Never    Passive Exposure: Not on file  Financial Resource Strain: Low Risk (07/22/2024)   Overall Financial Resource Strain (CARDIA)    Difficulty of Paying Living Expenses: Not hard at all  Food Insecurity: No Food Insecurity (07/22/2024)   Epic    Worried About Programme Researcher, Broadcasting/film/video in the Last Year: Never true    Ran Out of Food in the Last Year: Never true  Transportation Needs: No Transportation Needs (07/22/2024)   Epic    Lack of Transportation (Medical): No    Lack of Transportation (Non-Medical): No  Physical Activity: Sufficiently Active (07/22/2024)   Exercise Vital Sign    Days of Exercise per Week: 4 days    Minutes of Exercise per Session: 40 min  Stress: No Stress Concern Present (07/22/2024)   [employer] - Occupational Stress Questionnaire    Feeling of Stress: Only a little  Social Connections: Socially Integrated (07/22/2024)   Social Connection and Isolation Panel    Frequency of Communication with Friends and Family: Once a week    Frequency of Social Gatherings with Friends and Family: Twice a week    Attends Religious Services: More than 4 times per year    Active Member of Clubs or Organizations: Yes    Attends Banker Meetings: More than 4 times per year    Marital Status: Married  Depression (PHQ2-9): Low Risk (07/26/2024)   Depression (PHQ2-9)    PHQ-2 Score: 0  Alcohol Screen: Low Risk (07/22/2024)   Alcohol Screen    Last Alcohol Screening Score (AUDIT): 1  Housing: Unknown (07/22/2024)   Epic    Unable to Pay for Housing in the Last Year: No    Number of Times Moved in the Last Year: Not on file    Homeless in the Last Year: No  Utilities: Not At Risk (03/18/2024)   AHC Utilities    Threatened with loss of utilities: No  Health Literacy: Adequate Health Literacy (03/18/2024)   B1300 Health Literacy    Frequency of need for help with medical instructions: Never   Allergies[1] Family History  Problem Relation Age of Onset   Lung cancer Father        dad was a smoker   Heart disease Father    AAA (abdominal aortic aneurysm) Father    Heart disease Mother    Ataxia Maternal Grandmother    Dementia Maternal Grandmother    Aplastic anemia Sister    Prostate cancer Brother    Lung cancer Cousin    Colon cancer Neg Hx    Rectal cancer Neg Hx    Stomach cancer Neg Hx    Colon polyps Neg Hx    Esophageal cancer Neg Hx     Current Outpatient Medications (Endocrine & Metabolic):    levothyroxine  (SYNTHROID ) 125 MCG tablet, Take 1 tablet (125 mcg total) by mouth daily.   metFORMIN  (GLUCOPHAGE ) 1000 MG tablet, TAKE 1 TABLET TWICE DAILY  WITH MEALS   pioglitazone  (ACTOS ) 45 MG tablet, TAKE 1 TABLET DAILY   Semaglutide , 1  MG/DOSE, 4 MG/3ML SOPN, Inject 1 mg as directed once a week.   sitaGLIPtin  (JANUVIA ) 100 MG tablet, Take 1 tablet (100 mg total) by mouth daily.  Current Outpatient Medications (Cardiovascular):    atorvastatin  (LIPITOR ) 80 MG tablet, TAKE 1 TABLET DAILY   carvedilol  (COREG ) 3.125 MG tablet, TAKE 1 TABLET TWICE A DAY  WITH MEALS  Current Outpatient Medications (Respiratory):  albuterol  (VENTOLIN  HFA) 108 (90 Base) MCG/ACT inhaler, Inhale 2 puffs into the lungs every 6 (six) hours as needed for wheezing or shortness of breath.  Current Outpatient Medications (Analgesics):    aspirin  EC 81 MG tablet, Take 81 mg by mouth daily.   meloxicam (MOBIC) 15 MG tablet, Take 15 mg by mouth daily.  Current Outpatient Medications (Hematological):    cilostazol  (PLETAL ) 100 MG tablet, TAKE 1 TABLET TWICE A DAY   vitamin B-12 (CYANOCOBALAMIN ) 1000 MCG tablet, Take 1 tablet (1,000 mcg total) by mouth daily.  Current Outpatient Medications (Other):    Ascorbic Acid (VITAMIN C PO), Take 1 tablet by mouth.   levETIRAcetam  (KEPPRA ) 1000 MG tablet, Take 1 tablet (1,000 mg total) by mouth 2 (two) times daily.   omeprazole  (PRILOSEC) 40 MG capsule, TAKE 1 CAPSULE DAILY   tamsulosin  (FLOMAX ) 0.4 MG CAPS capsule, Take 0.4 mg by mouth daily.   triamcinolone  cream (KENALOG ) 0.1 %,    Reviewed prior external information including notes and imaging from  primary care provider As well as notes that were available from care everywhere and other healthcare systems.  Past medical history, social, surgical and family history all reviewed in electronic medical record.  No pertanent information unless stated regarding to the chief complaint.   Review of Systems:  No headache, visual changes, nausea, vomiting, diarrhea, constipation, dizziness, abdominal pain, skin rash, fevers, chills, night sweats, weight loss, swollen lymph nodes, body aches, joint swelling, chest pain, shortness of breath, mood changes. POSITIVE  muscle aches  Objective  There were no vitals taken for this visit.   General: No apparent distress alert and oriented x3 mood and affect normal, dressed appropriately.  HEENT: Pupils equal, extraocular movements intact  Respiratory: Patient's speak in full sentences and does not appear short of breath  Cardiovascular: No lower extremity edema, non tender, no erythema      Impression and Recommendations:           [1] No Known Allergies

## 2024-10-03 ENCOUNTER — Other Ambulatory Visit: Payer: Self-pay | Admitting: Internal Medicine

## 2024-10-04 ENCOUNTER — Ambulatory Visit

## 2024-10-04 ENCOUNTER — Ambulatory Visit: Admitting: Family Medicine

## 2024-10-04 ENCOUNTER — Encounter: Payer: Self-pay | Admitting: Family Medicine

## 2024-10-04 VITALS — BP 110/62 | HR 80 | Ht 73.0 in | Wt 246.0 lb

## 2024-10-04 DIAGNOSIS — M25561 Pain in right knee: Secondary | ICD-10-CM | POA: Diagnosis not present

## 2024-10-04 DIAGNOSIS — M170 Bilateral primary osteoarthritis of knee: Secondary | ICD-10-CM

## 2024-10-04 DIAGNOSIS — G8929 Other chronic pain: Secondary | ICD-10-CM

## 2024-10-04 NOTE — Assessment & Plan Note (Signed)
 Known arthritic changes noted.  Patient was having exacerbation noted.  No pain in the calf itself.  Discussed with patient about the possibility of viscosupplementation.  Seems to be more patellofemoral on x-ray as well as on exam.  Discussed which activities to do and which ones to avoid.  Patient given a Tru pull lite brace.  Follow-up with me again 6 to 8 weeks otherwise.

## 2024-10-04 NOTE — Patient Instructions (Signed)
 You have 14 days to return or exchange your brace Call [phone], then return the brace to our office Do prescribed exercises at least 3x a week See you again in 2-3 months Injection today Good to see you!

## 2024-10-26 ENCOUNTER — Other Ambulatory Visit: Payer: Self-pay | Admitting: Medical Genetics

## 2024-10-27 ENCOUNTER — Other Ambulatory Visit: Payer: Self-pay | Admitting: Internal Medicine

## 2024-10-28 ENCOUNTER — Ambulatory Visit: Admitting: Internal Medicine

## 2024-11-03 ENCOUNTER — Other Ambulatory Visit

## 2024-11-03 DIAGNOSIS — Z006 Encounter for examination for normal comparison and control in clinical research program: Secondary | ICD-10-CM

## 2024-11-05 ENCOUNTER — Ambulatory Visit: Admitting: Internal Medicine

## 2024-11-05 ENCOUNTER — Encounter: Payer: Self-pay | Admitting: Internal Medicine

## 2024-11-05 ENCOUNTER — Telehealth: Payer: Self-pay

## 2024-11-05 VITALS — BP 110/64 | HR 67 | Temp 97.8°F | Ht 73.0 in | Wt 243.0 lb

## 2024-11-05 DIAGNOSIS — E7800 Pure hypercholesterolemia, unspecified: Secondary | ICD-10-CM

## 2024-11-05 DIAGNOSIS — Z7985 Long-term (current) use of injectable non-insulin antidiabetic drugs: Secondary | ICD-10-CM

## 2024-11-05 DIAGNOSIS — E119 Type 2 diabetes mellitus without complications: Secondary | ICD-10-CM

## 2024-11-05 DIAGNOSIS — E538 Deficiency of other specified B group vitamins: Secondary | ICD-10-CM

## 2024-11-05 DIAGNOSIS — Z7984 Long term (current) use of oral hypoglycemic drugs: Secondary | ICD-10-CM | POA: Diagnosis not present

## 2024-11-05 MED ORDER — SEMAGLUTIDE (2 MG/DOSE) 8 MG/3ML ~~LOC~~ SOPN: 2.0000 mg | PEN_INJECTOR | SUBCUTANEOUS | 3 refills | Status: AC

## 2024-11-05 MED ORDER — PIOGLITAZONE HCL 30 MG PO TABS: 30.0000 mg | ORAL_TABLET | Freq: Every day | ORAL | 3 refills | Status: AC

## 2024-11-05 NOTE — Progress Notes (Signed)
 Patient ID: Allen Myers, male   DOB: August 18, 1946, 79 y.o.   MRN: [id number]        Chief Complaint: follow up HLD, DM, low b12       HPI:  Allen Myers is a 79 y.o. male here overall doing ok, Pt denies chest pain, increased sob or doe, wheezing, orthopnea, PND, increased LE swelling, palpitations, dizziness or syncope.   Pt denies polydipsia, polyuria, or new focal neuro s/s.    Pt denies fever, wt loss, night sweats, loss of appetite, or other constitutional symptoms  Peak wt has been 275 Wt Readings from Last 3 Encounters:  11/05/24 243 lb (110.2 kg)  10/04/24 246 lb (111.6 kg)  07/26/24 238 lb (108 kg)   BP Readings from Last 3 Encounters:  11/05/24 110/64  10/04/24 110/62  07/26/24 122/78         Past Medical History:  Diagnosis Date   Allergy    Arthritis    CAD (coronary artery disease)    Erectile dysfunction    History of colon polyps    History of diverticulitis    HYPERTENSION    Hypothyroidism    Kidney stones    passed it   Melanoma (HCC) 12/10/2019   Peripheral arterial disease    SAH (subarachnoid hemorrhage) (HCC)    Following syncopal episode   SDH (subdural hematoma) (HCC)    Following syncopal episode   Seizures (HCC)    08/2014 - controlled   Sleep apnea    mild  does not use CPAP   Sleep apnea in adult    Syncope and collapse    Type II diabetes mellitus (HCC)    type 2   Past Surgical History:  Procedure Laterality Date   COLONOSCOPY  10/14/2012   polyp   CORONARY ANGIOPLASTY WITH STENT PLACEMENT  05/11/2014   1   CRANIOTOMY Left 09/12/2014   Procedure: CRANIOTOMY HEMATOMA EVACUATION SUBDURAL;  Surgeon: Fairy Levels, MD;  Location: [facility];  Service: Neurosurgery;  Laterality: Left;   EXTRACORPOREAL SHOCK WAVE LITHOTRIPSY Left 01/08/2021   Procedure: EXTRACORPOREAL SHOCK WAVE LITHOTRIPSY (ESWL);  Surgeon: Renda Glance, MD;  Location: [facility];  Service: Urology;  Laterality: Left;   FRACTIONAL FLOW RESERVE  WIRE  05/11/2014   Procedure: FRACTIONAL FLOW RESERVE WIRE;  Surgeon: Victory LELON Claudene DOUGLAS, MD;  Location: [facility] CATH LAB;  Service: Cardiovascular;;   LEFT HEART CATHETERIZATION WITH CORONARY ANGIOGRAM N/A 05/11/2014   Procedure: LEFT HEART CATHETERIZATION WITH CORONARY ANGIOGRAM;  Surgeon: Victory LELON Claudene DOUGLAS, MD;  Location: [facility] CATH LAB;  Service: Cardiovascular;  Laterality: N/A;   POLYPECTOMY      reports that he quit smoking about 41 years ago. His smoking use included cigarettes. He started smoking about 71 years ago. He has a 30 pack-year smoking history. He has never used smokeless tobacco. He reports that he does not drink alcohol and does not use drugs. family history includes AAA (abdominal aortic aneurysm) in his father; Aplastic anemia in his sister; Ataxia in his maternal grandmother; Dementia in his maternal grandmother; Heart disease in his father and mother; Lung cancer in his cousin and father; Prostate cancer in his brother. Allergies[1] Medications Ordered Prior to Encounter[2]      ROS:  All others reviewed and negative.  Objective        PE:  BP 110/64 (BP Location: Left Arm, Patient Position: Sitting, Cuff Size: Normal)   Pulse 67   Temp 97.8 F (36.6 C) (  Oral)   Ht 6' 1 (1.854 m)   Wt 243 lb (110.2 kg)   SpO2 98%   BMI 32.06 kg/m                 Constitutional: Pt appears in NAD               HENT: Head: NCAT.                Right Ear: External ear normal.                 Left Ear: External ear normal.                Eyes: . Pupils are equal, round, and reactive to light. Conjunctivae and EOM are normal               Nose: without d/c or deformity               Neck: Neck supple. Gross normal ROM               Cardiovascular: Normal rate and regular rhythm.                 Pulmonary/Chest: Effort normal and breath sounds without rales or wheezing.                Abd:  Soft, NT, ND, + BS, no organomegaly               Neurological: Pt is alert. At baseline  orientation, motor grossly intact               Skin: Skin is warm. No rashes, no other new lesions, LE edema - none               Psychiatric: Pt behavior is normal without agitation   Micro: none  Cardiac tracings I have personally interpreted today:  none  Pertinent Radiological findings (summarize): none   Lab Results  Component Value Date   WBC 7.5 07/26/2024   HGB 14.4 07/26/2024   HCT 43.8 07/26/2024   PLT 135.0 (L) 07/26/2024   GLUCOSE 136 (H) 07/26/2024   CHOL 109 07/26/2024   TRIG 123.0 07/26/2024   HDL 46.00 07/26/2024   LDLDIRECT 85.7 06/11/2013   LDLCALC 38 07/26/2024   ALT 16 07/26/2024   AST 15 07/26/2024   NA 138 07/26/2024   K 4.4 07/26/2024   CL 102 07/26/2024   CREATININE 0.99 07/26/2024   BUN 23 07/26/2024   CO2 28 07/26/2024   TSH 0.41 07/26/2024   PSA 2.54 07/17/2023   INR 1.1 (H) 07/26/2024   HGBA1C 6.3 07/26/2024   MICROALBUR 1.5 07/26/2024   Assessment/Plan:  Allen Myers is a 79 y.o. White or Caucasian [1] male with  has a past medical history of Allergy, Arthritis, CAD (coronary artery disease), Erectile dysfunction, History of colon polyps, History of diverticulitis, HYPERTENSION, Hypothyroidism, Kidney stones, Melanoma (HCC) (12/10/2019), Peripheral arterial disease, SAH (subarachnoid hemorrhage) (HCC), SDH (subdural hematoma) (HCC), Seizures (HCC), Sleep apnea, Sleep apnea in adult, Syncope and collapse, and Type II diabetes mellitus (HCC).  Diabetes mellitus type 2, noninsulin dependent (HCC) Lab Results  Component Value Date   HGBA1C 6.3 07/26/2024   With uncontrolled obesity, pt to continue current medical treatment januvia  100 every day, metformin  1000 bid, and increased ozempic  to 2 mg weekly with reduced actos  to 30 mg as well.  Non insulin  dependent   B12 deficiency Lab Results  Component  Value Date   VITAMINB12 515 07/26/2024   Stable, cont oral replacement - b12 1000 mcg qd   HLD (hyperlipidemia) Lab Results   Component Value Date   LDLCALC 38 07/26/2024   Stable, pt to continue current statin lipitor  80 mg qd  Followup: Return in about 6 months (around 05/05/2025).  Lynwood Rush, MD 11/06/2024 6:49 PM [facility] Internal Medicine     [1] No Known Allergies [2]  Current Outpatient Medications on File Prior to Visit  Medication Sig Dispense Refill   albuterol  (VENTOLIN  HFA) 108 (90 Base) MCG/ACT inhaler Inhale 2 puffs into the lungs every 6 (six) hours as needed for wheezing or shortness of breath. 8 g 1   Ascorbic Acid (VITAMIN C PO) Take 1 tablet by mouth.     aspirin  EC 81 MG tablet Take 81 mg by mouth daily.     atorvastatin  (LIPITOR ) 80 MG tablet TAKE 1 TABLET DAILY 90 tablet 2   carvedilol  (COREG ) 3.125 MG tablet TAKE 1 TABLET TWICE A DAY  WITH MEALS 180 tablet 1   cilostazol  (PLETAL ) 100 MG tablet TAKE 1 TABLET TWICE A DAY 180 tablet 2   JANUVIA  100 MG tablet TAKE 1 TABLET DAILY 90 tablet 3   levETIRAcetam  (KEPPRA ) 1000 MG tablet Take 1 tablet (1,000 mg total) by mouth 2 (two) times daily. 180 tablet 4   levothyroxine  (SYNTHROID ) 125 MCG tablet Take 1 tablet (125 mcg total) by mouth daily. 90 tablet 3   meloxicam (MOBIC) 15 MG tablet Take 15 mg by mouth daily.     metFORMIN  (GLUCOPHAGE ) 1000 MG tablet TAKE 1 TABLET TWICE DAILY  WITH MEALS 180 tablet 1   omeprazole  (PRILOSEC) 40 MG capsule TAKE 1 CAPSULE DAILY 90 capsule 3   tamsulosin  (FLOMAX ) 0.4 MG CAPS capsule Take 0.4 mg by mouth daily.     triamcinolone  cream (KENALOG ) 0.1 %      vitamin B-12 (CYANOCOBALAMIN ) 1000 MCG tablet Take 1 tablet (1,000 mcg total) by mouth daily. 90 tablet 1   No current facility-administered medications on file prior to visit.

## 2024-11-05 NOTE — Telephone Encounter (Signed)
 Copied from CRM [id number]. Topic: Appointments - Transfer of Care >> Nov 05, 2024 10:51 AM Revonda D wrote: Pt is requesting to transfer FROM: [facility], Dr.James Vinh Pt is requesting to transfer TO: [facility] at [place], Dr.Ann Roseau Reason for requested transfer: PCP leaving practice  It is the responsibility of the team the patient would like to transfer to (Dr. Wendolyn) to reach out to the patient if for any reason this transfer is not acceptable.

## 2024-11-05 NOTE — Patient Instructions (Signed)
 Ok to increase the ozempic  to 2 mg weekly  Ok to reduce the pioglitazone  to 30  mg per day to avoid low sugars with further wt loss  Please continue all other medications as before, and refills have been done if requested.  Please have the pharmacy call with any other refills you may need.  Please continue your efforts at being more active, low cholesterol diet, and weight control.  Please keep your appointments with your specialists as you may have planned  Please make an Appointment to return in 6 months, or sooner if needed

## 2024-11-06 ENCOUNTER — Encounter: Payer: Self-pay | Admitting: Internal Medicine

## 2024-11-06 NOTE — Assessment & Plan Note (Signed)
 Lab Results  Component Value Date   HGBA1C 6.3 07/26/2024   With uncontrolled obesity, pt to continue current medical treatment januvia  100 every day, metformin  1000 bid, and increased ozempic  to 2 mg weekly with reduced actos  to 30 mg as well.  Non insulin  dependent

## 2024-11-06 NOTE — Assessment & Plan Note (Signed)
 Lab Results  Component Value Date   LDLCALC 38 07/26/2024   Stable, pt to continue current statin lipitor  80 mg qd

## 2024-11-06 NOTE — Assessment & Plan Note (Signed)
 Lab Results  Component Value Date   VITAMINB12 515 07/26/2024   Stable, cont oral replacement - b12 1000 mcg qd

## 2024-11-09 LAB — GENECONNECT MOLECULAR SCREEN: Genetic Analysis Overall Interpretation: NEGATIVE

## 2024-11-12 ENCOUNTER — Ambulatory Visit: Admitting: Podiatry

## 2024-11-16 ENCOUNTER — Encounter (HOSPITAL_COMMUNITY): Payer: Self-pay

## 2024-12-06 ENCOUNTER — Ambulatory Visit: Admitting: Family Medicine

## 2025-01-24 ENCOUNTER — Ambulatory Visit: Admitting: Internal Medicine

## 2025-03-02 ENCOUNTER — Encounter: Admitting: Family Medicine

## 2025-03-22 ENCOUNTER — Ambulatory Visit

## 2025-03-30 ENCOUNTER — Ambulatory Visit: Admitting: Adult Health

## 2025-04-12 ENCOUNTER — Ambulatory Visit: Admitting: Podiatry
